# Patient Record
Sex: Female | Born: 1935
Health system: Southern US, Community
[De-identification: ages and names within clinical notes are randomized; demographics above are authoritative.]

## PROBLEM LIST (undated history)

## (undated) DIAGNOSIS — E785 Hyperlipidemia, unspecified: Secondary | ICD-10-CM

## (undated) DIAGNOSIS — C801 Malignant (primary) neoplasm, unspecified: Secondary | ICD-10-CM

## (undated) DIAGNOSIS — R0989 Other specified symptoms and signs involving the circulatory and respiratory systems: Secondary | ICD-10-CM

## (undated) DIAGNOSIS — M545 Low back pain: Secondary | ICD-10-CM

## (undated) DIAGNOSIS — M25559 Pain in unspecified hip: Secondary | ICD-10-CM

## (undated) DIAGNOSIS — I10 Essential (primary) hypertension: Secondary | ICD-10-CM

## (undated) DIAGNOSIS — R600 Localized edema: Secondary | ICD-10-CM

## (undated) DIAGNOSIS — R011 Cardiac murmur, unspecified: Secondary | ICD-10-CM

## (undated) DIAGNOSIS — M810 Age-related osteoporosis without current pathological fracture: Secondary | ICD-10-CM

## (undated) DIAGNOSIS — I38 Endocarditis, valve unspecified: Secondary | ICD-10-CM

## (undated) DIAGNOSIS — Z9289 Personal history of other medical treatment: Secondary | ICD-10-CM

## (undated) DIAGNOSIS — M1990 Unspecified osteoarthritis, unspecified site: Secondary | ICD-10-CM

## (undated) HISTORY — DX: Other specified symptoms and signs involving the circulatory and respiratory systems: R09.89

## (undated) HISTORY — DX: Unspecified osteoarthritis, unspecified site: M19.90

## (undated) HISTORY — DX: Pain in unspecified hip: M25.559

## (undated) HISTORY — DX: Endocarditis, valve unspecified: I38

## (undated) HISTORY — DX: Essential (primary) hypertension: I10

## (undated) HISTORY — DX: Hyperlipidemia, unspecified: E78.5

## (undated) HISTORY — DX: Age-related osteoporosis without current pathological fracture: M81.0

## (undated) HISTORY — DX: Localized edema: R60.0

## (undated) HISTORY — DX: Low back pain: M54.5

## (undated) HISTORY — DX: Cardiac murmur, unspecified: R01.1

## (undated) HISTORY — DX: Personal history of other medical treatment: Z92.89

---

## 1964-05-13 HISTORY — PX: BREAST EXCISIONAL BIOPSY: SUR124

## 1997-05-13 HISTORY — PX: BREAST EXCISIONAL BIOPSY: SUR124

## 1997-10-24 ENCOUNTER — Other Ambulatory Visit: Admission: RE | Admit: 1997-10-24 | Discharge: 1997-10-24 | Payer: Self-pay | Admitting: Gynecology

## 1998-12-01 ENCOUNTER — Other Ambulatory Visit: Admission: RE | Admit: 1998-12-01 | Discharge: 1998-12-01 | Payer: Self-pay | Admitting: Gynecology

## 1999-02-26 ENCOUNTER — Encounter: Admission: RE | Admit: 1999-02-26 | Discharge: 1999-02-26 | Payer: Self-pay | Admitting: *Deleted

## 1999-09-20 ENCOUNTER — Emergency Department (HOSPITAL_COMMUNITY): Admission: EM | Admit: 1999-09-20 | Discharge: 1999-09-20 | Payer: Self-pay | Admitting: Emergency Medicine

## 1999-09-20 ENCOUNTER — Encounter: Payer: Self-pay | Admitting: Emergency Medicine

## 1999-12-11 ENCOUNTER — Other Ambulatory Visit: Admission: RE | Admit: 1999-12-11 | Discharge: 1999-12-11 | Payer: Self-pay | Admitting: *Deleted

## 1999-12-14 ENCOUNTER — Encounter: Payer: Self-pay | Admitting: Gynecology

## 1999-12-14 ENCOUNTER — Encounter: Admission: RE | Admit: 1999-12-14 | Discharge: 1999-12-14 | Payer: Self-pay | Admitting: Gynecology

## 2000-01-09 ENCOUNTER — Encounter: Admission: RE | Admit: 2000-01-09 | Discharge: 2000-01-09 | Payer: Self-pay | Admitting: *Deleted

## 2000-01-09 ENCOUNTER — Encounter: Payer: Self-pay | Admitting: *Deleted

## 2000-02-20 DIAGNOSIS — Z9289 Personal history of other medical treatment: Secondary | ICD-10-CM

## 2000-02-20 HISTORY — DX: Personal history of other medical treatment: Z92.89

## 2000-12-24 ENCOUNTER — Encounter: Payer: Self-pay | Admitting: *Deleted

## 2000-12-24 ENCOUNTER — Encounter: Admission: RE | Admit: 2000-12-24 | Discharge: 2000-12-24 | Payer: Self-pay | Admitting: *Deleted

## 2001-02-03 ENCOUNTER — Encounter: Payer: Self-pay | Admitting: *Deleted

## 2001-02-03 ENCOUNTER — Encounter: Admission: RE | Admit: 2001-02-03 | Discharge: 2001-02-03 | Payer: Self-pay | Admitting: *Deleted

## 2001-03-10 ENCOUNTER — Other Ambulatory Visit: Admission: RE | Admit: 2001-03-10 | Discharge: 2001-03-10 | Payer: Self-pay | Admitting: *Deleted

## 2001-05-13 HISTORY — PX: CATARACT EXTRACTION: SUR2

## 2001-12-25 ENCOUNTER — Encounter: Admission: RE | Admit: 2001-12-25 | Discharge: 2001-12-25 | Payer: Self-pay | Admitting: Endocrinology

## 2001-12-25 ENCOUNTER — Encounter: Payer: Self-pay | Admitting: Endocrinology

## 2002-12-20 ENCOUNTER — Encounter: Admission: RE | Admit: 2002-12-20 | Discharge: 2002-12-20 | Payer: Self-pay | Admitting: Endocrinology

## 2002-12-20 ENCOUNTER — Encounter: Payer: Self-pay | Admitting: Endocrinology

## 2003-12-07 HISTORY — PX: OTHER SURGICAL HISTORY: SHX169

## 2003-12-29 ENCOUNTER — Encounter: Admission: RE | Admit: 2003-12-29 | Discharge: 2003-12-29 | Payer: Self-pay | Admitting: Endocrinology

## 2004-05-28 ENCOUNTER — Other Ambulatory Visit: Admission: RE | Admit: 2004-05-28 | Discharge: 2004-05-28 | Payer: Self-pay | Admitting: Family Medicine

## 2004-10-22 HISTORY — PX: COLONOSCOPY: SHX174

## 2004-12-31 ENCOUNTER — Encounter: Admission: RE | Admit: 2004-12-31 | Discharge: 2004-12-31 | Payer: Self-pay | Admitting: Family Medicine

## 2005-01-09 ENCOUNTER — Encounter: Admission: RE | Admit: 2005-01-09 | Discharge: 2005-01-09 | Payer: Self-pay | Admitting: Family Medicine

## 2005-04-26 ENCOUNTER — Encounter: Admission: RE | Admit: 2005-04-26 | Discharge: 2005-04-26 | Payer: Self-pay | Admitting: Family Medicine

## 2006-01-01 ENCOUNTER — Encounter: Admission: RE | Admit: 2006-01-01 | Discharge: 2006-01-01 | Payer: Self-pay | Admitting: Family Medicine

## 2007-01-06 ENCOUNTER — Encounter: Admission: RE | Admit: 2007-01-06 | Discharge: 2007-01-06 | Payer: Self-pay | Admitting: Family Medicine

## 2007-04-29 ENCOUNTER — Encounter: Admission: RE | Admit: 2007-04-29 | Discharge: 2007-04-29 | Payer: Self-pay | Admitting: Family Medicine

## 2008-01-07 ENCOUNTER — Encounter: Admission: RE | Admit: 2008-01-07 | Discharge: 2008-01-07 | Payer: Self-pay | Admitting: Family Medicine

## 2009-01-10 ENCOUNTER — Encounter: Admission: RE | Admit: 2009-01-10 | Discharge: 2009-01-10 | Payer: Self-pay | Admitting: Family Medicine

## 2009-05-11 ENCOUNTER — Encounter: Admission: RE | Admit: 2009-05-11 | Discharge: 2009-05-11 | Payer: Self-pay | Admitting: Family Medicine

## 2010-01-11 ENCOUNTER — Encounter: Admission: RE | Admit: 2010-01-11 | Discharge: 2010-01-11 | Payer: Self-pay | Admitting: Family Medicine

## 2010-06-03 ENCOUNTER — Encounter: Payer: Self-pay | Admitting: Family Medicine

## 2010-12-05 ENCOUNTER — Other Ambulatory Visit: Payer: Self-pay | Admitting: Family Medicine

## 2010-12-05 DIAGNOSIS — Z1231 Encounter for screening mammogram for malignant neoplasm of breast: Secondary | ICD-10-CM

## 2011-01-29 ENCOUNTER — Ambulatory Visit: Payer: Self-pay

## 2011-02-05 ENCOUNTER — Ambulatory Visit
Admission: RE | Admit: 2011-02-05 | Discharge: 2011-02-05 | Disposition: A | Discharge status: Home / Self Care | Payer: Medicare Other | Source: Ambulatory Visit | Attending: Family Medicine | Admitting: Family Medicine

## 2011-02-05 DIAGNOSIS — Z1231 Encounter for screening mammogram for malignant neoplasm of breast: Secondary | ICD-10-CM

## 2011-02-08 ENCOUNTER — Other Ambulatory Visit: Payer: Self-pay | Admitting: Family Medicine

## 2011-02-08 DIAGNOSIS — R928 Other abnormal and inconclusive findings on diagnostic imaging of breast: Secondary | ICD-10-CM

## 2011-02-19 ENCOUNTER — Ambulatory Visit
Admission: RE | Admit: 2011-02-19 | Discharge: 2011-02-19 | Disposition: A | Discharge status: Home / Self Care | Payer: Medicare Other | Source: Ambulatory Visit | Attending: Family Medicine | Admitting: Family Medicine

## 2011-02-19 DIAGNOSIS — R928 Other abnormal and inconclusive findings on diagnostic imaging of breast: Secondary | ICD-10-CM

## 2011-04-01 ENCOUNTER — Other Ambulatory Visit: Payer: Self-pay | Admitting: Family Medicine

## 2011-05-16 ENCOUNTER — Ambulatory Visit
Admission: RE | Admit: 2011-05-16 | Discharge: 2011-05-16 | Disposition: A | Discharge status: Home / Self Care | Payer: Medicare Other | Source: Ambulatory Visit | Attending: Family Medicine | Admitting: Family Medicine

## 2012-01-24 ENCOUNTER — Other Ambulatory Visit: Payer: Self-pay | Admitting: Family Medicine

## 2012-01-24 DIAGNOSIS — Z1231 Encounter for screening mammogram for malignant neoplasm of breast: Secondary | ICD-10-CM

## 2012-02-21 ENCOUNTER — Ambulatory Visit
Admission: RE | Admit: 2012-02-21 | Discharge: 2012-02-21 | Disposition: A | Discharge status: Home / Self Care | Payer: Medicare Other | Source: Ambulatory Visit | Attending: Family Medicine | Admitting: Family Medicine

## 2012-02-21 DIAGNOSIS — Z1231 Encounter for screening mammogram for malignant neoplasm of breast: Secondary | ICD-10-CM

## 2012-12-24 ENCOUNTER — Encounter: Payer: Self-pay | Admitting: Internal Medicine

## 2012-12-28 ENCOUNTER — Encounter: Payer: Self-pay | Admitting: Internal Medicine

## 2012-12-29 ENCOUNTER — Ambulatory Visit (INDEPENDENT_AMBULATORY_CARE_PROVIDER_SITE_OTHER): Payer: Medicare Other | Admitting: Internal Medicine

## 2012-12-29 ENCOUNTER — Encounter: Payer: Self-pay | Admitting: Internal Medicine

## 2012-12-29 VITALS — BP 144/86 | HR 69 | Ht 64.0 in | Wt 135.6 lb

## 2012-12-29 DIAGNOSIS — R011 Cardiac murmur, unspecified: Secondary | ICD-10-CM

## 2012-12-29 DIAGNOSIS — I10 Essential (primary) hypertension: Secondary | ICD-10-CM

## 2012-12-29 DIAGNOSIS — E785 Hyperlipidemia, unspecified: Secondary | ICD-10-CM

## 2012-12-29 NOTE — Patient Instructions (Addendum)
Your physician wants you to follow-up in: 1 year. You will receive a reminder letter in the mail two months in advance. If you don't receive a letter, please call our office to schedule the follow-up appointment.  Your physician recommends that you return for lab work in: 1-2 weeks. You will need to be fasting for this blood work.  NMR with lipid

## 2012-12-29 NOTE — Progress Notes (Signed)
OFFICE NOTE  Chief Complaint:  Annual follow-up  Primary Care Physician: Gweneth Dimitri, MD  HPI:  Shelley Flores  Is a 79 year old female who comes in for followup. She has hypertension and hyperlipidemia and had had an echocardiogram in 2010 that showed mild concentric LVH, mild AI, mild MR/TR with pulmonary artery pressure of 32. Her EF was normal. She has no specific cardiac complaints. She did bring a list of her blood pressures today which are on the high normal side but it basically fairly well controlled. She is requesting refills of her medications. She's not had a lipid profile in one year.  PMHx:  Past Medical History  Diagnosis Date  . Hypertension   . Hyperlipidemia   . Osteoporosis   . Edema extremities     LE edema   . Valvular heart disease     Mild valvular heart disease by Echo in 2010, all mild and symptomatic, including concentric LVH, MR, TR, and AI with pulmonary artery pressure of 32. EF was normal.  . Hip pain   . Lower back pain   . Osteoarthritis   . Heart murmur     history of murmur  . H/O myocardial perfusion scan 02/20/00    To rule out ischemia - Negative adequate Bruce protocol exercise stress test with a deconditioned exercise response and normal static myocardial perfusion images with EF calculated by QGS of 77%. Represents a low risk study.  . Bruit     Abdominal bruit - Abdominal aorta/renal duplex Doppler evaluation 12/07/03 -Mildly abnormal evaluation. 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 This is consistant w/median arcuate ligament compression syndrome. *Right & Left Kidney: Essentially equal in size, symmetrical in shape w/no significant abnormalities visualized. *Right & Left Renal Arteries: No significant abnormalities.    Past Surgical History  Procedure Laterality Date  . Cataract extraction  2003  . Abdominal aorta/renal duplex doppler evaluation  12/07/03    For abdominal bruit. Mildly abnormal evaluation. (See  bruit in medical history)    FAMHx:  Family History  Problem Relation Age of Onset  . Leukemia Mother 90  . Cancer Father     esophagial cancer  . Heart failure Maternal Grandmother   . Tuberculosis Maternal Grandfather   . Heart disease Paternal Grandmother   . Cancer Paternal Grandfather     SOCHx:   reports that she quit smoking about 50 years ago. Her smoking use included Cigarettes. She has a 1.5 pack-year smoking history. She does not have any smokeless tobacco history on file. She reports that she drinks about 1.0 ounces of alcohol per week. She reports that she does not use illicit drugs.  ALLERGIES:  No Known Allergies  ROS: A comprehensive review of systems was negative.  HOME MEDS: Current Outpatient Prescriptions  Medication Sig Dispense Refill  . alendronate (FOSAMAX) 70 MG tablet Take 1 tablet by mouth once a week.      [name] amLODipine (NORVASC) 5 MG tablet Take 5 mg by mouth daily.      [name] aspirin 81 MG tablet Take 81 mg by mouth daily.      [name] atorvastatin (LIPITOR) 20 MG tablet Take 20 mg by mouth daily.      . Calcium-Vitamin D-Vitamin K (CALCIUM SOFT CHEWS PO) Take 500 mg by mouth daily.       . cholecalciferol (VITAMIN D) 1000 UNITS tablet Take 1,000 Units by mouth daily.      . Glucosamine-Chondroit-Vit C-Mn (GLUCOSAMINE 1500 COMPLEX) CAPS Take 1 capsule  by mouth daily.      . metoprolol succinate (TOPROL-XL) 50 MG 24 hr tablet Take 50 mg by mouth daily. Take with or immediately following a meal.      . Multiple Vitamin (MULTIVITAMIN) tablet Take 1 tablet by mouth daily.       No current facility-administered medications for this visit.    LABS/IMAGING: No results found for this or any previous visit (from the past 48 hour(s)). No results found.  VITALS: BP 144/86  Pulse 69  Ht 5\' 4"  (1.626 m)  Wt 135 lb 9.6 oz (61.508 kg)  BMI 23.26 kg/m2  EXAM: General appearance: alert and no distress Neck: no adenopathy, no carotid bruit, no JVD, supple,  symmetrical, trachea midline and thyroid not enlarged, symmetric, no tenderness/mass/nodules Lungs: clear to auscultation bilaterally Heart: regular rate and rhythm, S1, S2 normal, 2/6 diastolic murmur and 2/6 SEM at apex, click, rub or gallop Abdomen: soft, non-tender; bowel sounds normal; no masses,  no organomegaly Extremities: extremities normal, atraumatic, no cyanosis or edema Pulses: 2+ and symmetric Skin: Skin color, texture, turgor normal. No rashes or lesions Neurologic: Grossly normal  EKG: Normal sinus rhythm at 69  ASSESSMENT: 1. Hypertension-fairly well-controlled 2. Dyslipidemia  PLAN: 1.   Mrs. Baum is doing fairly well. She is asymptomatic and is in need of refills of her medications. We'll go ahead and recheck a lipid profile and continue on her current blood pressure medications. Although blood pressures are on the high end of normal given her age, is reasonable to allow slightly higher blood pressures as per recent guidelines this past year allowing blood pressures of 150 systolic. Plan to see her back annually.  Chrystie Nose, MD, [facility] Attending Cardiologist [facility]  Zyshawn Bohnenkamp C 12/29/2012, 12:07 PM

## 2012-12-31 LAB — NMR LIPOPROFILE WITH LIPIDS
Cholesterol, Total: 151 mg/dL (ref <200)
HDL Particle Number: 36.8 umol/L (ref >=30.5)
HDL Size: 9.3 nm (ref >=9.2)
HDL-C: 44 mg/dL (ref >=40)
LDL (calc): 92 mg/dL (ref <100)
LDL Particle Number: 1119 nmol/L — ABNORMAL HIGH (ref <1000)
LDL Size: 20.6 nm (ref >20.5)
LP-IR Score: 30 (ref <=45)
Large HDL-P: 6.7 umol/L (ref >=4.8)
Large VLDL-P: <0.8 nmol/L (ref <=2.7)
Small LDL Particle Number: 661 nmol/L — ABNORMAL HIGH (ref <=527)
Triglycerides: 75 mg/dL (ref <150)
VLDL Size: 46 nm (ref <=46.6)

## 2013-01-04 ENCOUNTER — Encounter: Payer: Self-pay | Admitting: Internal Medicine

## 2013-01-05 ENCOUNTER — Other Ambulatory Visit: Payer: Self-pay | Admitting: *Deleted

## 2013-01-05 MED ORDER — ATORVASTATIN CALCIUM 20 MG PO TABS: 20.0000 mg | ORAL_TABLET | Freq: Every day | ORAL | Status: DC

## 2013-01-26 ENCOUNTER — Other Ambulatory Visit: Payer: Self-pay | Admitting: *Deleted

## 2013-01-26 MED ORDER — AMLODIPINE BESYLATE 5 MG PO TABS: 5.0000 mg | ORAL_TABLET | Freq: Every day | ORAL | Status: DC

## 2013-01-26 MED ORDER — METOPROLOL SUCCINATE ER 50 MG PO TB24: 50.0000 mg | ORAL_TABLET | Freq: Every day | ORAL | Status: DC

## 2013-01-26 NOTE — Telephone Encounter (Signed)
Rx was sent to pharmacy electronically. 

## 2013-02-01 ENCOUNTER — Other Ambulatory Visit: Payer: Self-pay

## 2013-02-01 DIAGNOSIS — Z1231 Encounter for screening mammogram for malignant neoplasm of breast: Secondary | ICD-10-CM

## 2013-03-08 ENCOUNTER — Ambulatory Visit
Admission: RE | Admit: 2013-03-08 | Discharge: 2013-03-08 | Disposition: A | Discharge status: Home / Self Care | Payer: 59 | Source: Ambulatory Visit

## 2013-03-08 DIAGNOSIS — Z1231 Encounter for screening mammogram for malignant neoplasm of breast: Secondary | ICD-10-CM

## 2013-03-18 ENCOUNTER — Other Ambulatory Visit: Payer: Self-pay

## 2013-06-10 ENCOUNTER — Other Ambulatory Visit: Payer: Self-pay | Admitting: Family Medicine

## 2013-06-10 DIAGNOSIS — M810 Age-related osteoporosis without current pathological fracture: Secondary | ICD-10-CM

## 2013-06-23 ENCOUNTER — Ambulatory Visit
Admission: RE | Admit: 2013-06-23 | Discharge: 2013-06-23 | Disposition: A | Discharge status: Home / Self Care | Payer: 59 | Source: Ambulatory Visit | Attending: Family Medicine | Admitting: Family Medicine

## 2013-06-23 DIAGNOSIS — M810 Age-related osteoporosis without current pathological fracture: Secondary | ICD-10-CM

## 2013-06-23 HISTORY — PX: OTHER SURGICAL HISTORY: SHX169

## 2014-01-03 ENCOUNTER — Other Ambulatory Visit: Payer: Self-pay | Admitting: Internal Medicine

## 2014-01-03 NOTE — Telephone Encounter (Signed)
Rx refill sent to patient pharmacy   

## 2014-01-10 ENCOUNTER — Encounter: Payer: Self-pay | Admitting: Internal Medicine

## 2014-01-10 ENCOUNTER — Ambulatory Visit (INDEPENDENT_AMBULATORY_CARE_PROVIDER_SITE_OTHER): Payer: 59 | Admitting: Internal Medicine

## 2014-01-10 VITALS — BP 130/72 | HR 77 | Ht 65.0 in | Wt 135.5 lb

## 2014-01-10 DIAGNOSIS — E785 Hyperlipidemia, unspecified: Secondary | ICD-10-CM

## 2014-01-10 DIAGNOSIS — R011 Cardiac murmur, unspecified: Secondary | ICD-10-CM

## 2014-01-10 DIAGNOSIS — I10 Essential (primary) hypertension: Secondary | ICD-10-CM

## 2014-01-10 MED ORDER — ATORVASTATIN CALCIUM 20 MG PO TABS: ORAL_TABLET | ORAL | Status: DC

## 2014-01-10 MED ORDER — AMLODIPINE BESYLATE 5 MG PO TABS: 5.0000 mg | ORAL_TABLET | Freq: Every day | ORAL | Status: DC

## 2014-01-10 MED ORDER — METOPROLOL SUCCINATE ER 50 MG PO TB24: 50.0000 mg | ORAL_TABLET | Freq: Every day | ORAL | Status: DC

## 2014-01-10 NOTE — Progress Notes (Signed)
OFFICE NOTE  Chief Complaint:  Annual follow-up  Primary Care Physician: Cari Caraway, MD  HPI:  Shelley Flores  Is a 80 year old female who comes in for followup. She has hypertension and hyperlipidemia and had had an echocardiogram in 2010 that showed mild concentric LVH, mild AI, mild MR/TR with pulmonary artery pressure of 32. Her EF was normal. She has no specific cardiac complaints. Blood pressure and cholesterol have been well-controlled. She is due for recheck of her lipid profile. Unfortunately she does not get much exercise but she is looking into it and possibly considering the Silver Sneakers program.  PMHx:  Past Medical History  Diagnosis Date  . Hypertension   . Hyperlipidemia   . Osteoporosis   . Edema extremities     LE edema   . Valvular heart disease     Mild valvular heart disease by Echo in 2010, all mild and symptomatic, including concentric LVH, MR, TR, and AI with pulmonary artery pressure of 32. EF was normal.  . Hip pain   . Lower back pain   . Osteoarthritis   . Heart murmur     history of murmur  . H/O myocardial perfusion scan 02/20/00    To rule out ischemia - Negative adequate Bruce protocol exercise stress test with a deconditioned exercise response and normal static myocardial perfusion images with EF calculated by QGS of 77%. Represents a low risk study.  . Bruit     Abdominal bruit - Abdominal aorta/renal duplex Doppler evaluation 12/07/03 -Mildly abnormal evaluation. 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 This is consistant w/median arcuate ligament compression syndrome. *Right & Left Kidney: Essentially equal in size, symmetrical in shape w/no significant abnormalities visualized. *Right & Left Renal Arteries: No significant abnormalities.    Past Surgical History  Procedure Laterality Date  . Cataract extraction  2003  . Abdominal aorta/renal duplex doppler evaluation  12/07/03    For abdominal bruit. Mildly  abnormal evaluation. (See bruit in medical history)    FAMHx:  Family History  Problem Relation Age of Onset  . Leukemia Mother 62  . Cancer Father     esophagial cancer  . Heart failure Maternal Grandmother   . Tuberculosis Maternal Grandfather   . Heart disease Paternal Grandmother   . Cancer Paternal Grandfather     SOCHx:   reports that she quit smoking about 51 years ago. Her smoking use included Cigarettes. She has a 1.5 pack-year smoking history. She does not have any smokeless tobacco history on file. She reports that she drinks about one ounce of alcohol per week. She reports that she does not use illicit drugs.  ALLERGIES:  No Known Allergies  ROS: A comprehensive review of systems was negative.  HOME MEDS: Current Outpatient Prescriptions  Medication Sig Dispense Refill  . amLODipine (NORVASC) 5 MG tablet Take 1 tablet (5 mg total) by mouth daily.  90 tablet  3  . aspirin 81 MG tablet Take 81 mg by mouth daily.      [name] atorvastatin (LIPITOR) 20 MG tablet take 1 tablet by mouth daily  90 tablet  3  . Calcium-Vitamin D-Vitamin K (CALCIUM SOFT CHEWS PO) Take 500 mg by mouth daily.       . cholecalciferol (VITAMIN D) 1000 UNITS tablet Take 1,000 Units by mouth daily.      . Glucosamine HCl 1500 MG TABS Take by mouth daily.      . Magnesium 250 MG TABS Take by mouth daily.      [name]  metoprolol succinate (TOPROL-XL) 50 MG 24 hr tablet Take 1 tablet (50 mg total) by mouth daily. Take with or immediately following a meal.  90 tablet  3  . Multiple Vitamin (MULTIVITAMIN) tablet Take 1 tablet by mouth daily.       No current facility-administered medications for this visit.    LABS/IMAGING: No results found for this or any previous visit (from the past 48 hour(s)). No results found.  VITALS: BP 130/72  Pulse 77  Ht 5\' 5"  (1.651 m)  Wt 135 lb 8 oz (61.462 kg)  BMI 22.55 kg/m2  EXAM: General appearance: alert and no distress Neck: no adenopathy, no carotid bruit, no  JVD, supple, symmetrical, trachea midline and thyroid not enlarged, symmetric, no tenderness/mass/nodules Lungs: clear to auscultation bilaterally Heart: regular rate and rhythm, S1, S2 normal, 2/6 diastolic murmur and 2/6 SEM at apex, click, rub or gallop Abdomen: soft, non-tender; bowel sounds normal; no masses,  no organomegaly Extremities: extremities normal, atraumatic, no cyanosis or edema Pulses: 2+ and symmetric Skin: Skin color, texture, turgor normal. No rashes or lesions Neurologic: Grossly normal  EKG: Normal sinus rhythm at 77, occasional PAC's  ASSESSMENT: 1. Hypertension-fairly well-controlled 2. Dyslipidemia 3. Asymptomatic PVC's  PLAN: 1.   Shelley Flores is doing fairly well. She is asymptomatic and is in need of refills of her medications. We'll go ahead and recheck a lipid profile and continue on her current blood pressure medications. Blood pressure looks well controlled today. She should have a followup fairly soon with her primary care provider as well.  Plan followup annually or sooner as necessary.  Pixie Casino, MD, [facility] Attending Cardiologist [facility] 01/10/2014, 10:41 AM

## 2014-01-10 NOTE — Patient Instructions (Signed)
Your physician recommends that you return for lab work at your convenience. You will need to be fasting.   Your physician wants you to follow-up in: 1 year. You will receive a reminder letter in the mail two months in advance. If you don't receive a letter, please call our office to schedule the follow-up appointment.

## 2014-01-14 LAB — NMR LIPOPROFILE WITH LIPIDS
Cholesterol, Total: 160 mg/dL (ref 100–199)
HDL Particle Number: 37.1 umol/L (ref >=30.5)
HDL Size: 9.1 nm — ABNORMAL LOW (ref >=9.2)
HDL-C: 53 mg/dL (ref >39)
LDL (calc): 85 mg/dL (ref 0–99)
LDL Particle Number: 1071 nmol/L — ABNORMAL HIGH (ref <1000)
LDL Size: 20.8 nm (ref >=20.8)
LP-IR Score: 26 (ref <=45)
Large HDL-P: 6.9 umol/L (ref >=4.8)
Large VLDL-P: 1 nmol/L (ref <=2.7)
Small LDL Particle Number: 494 nmol/L (ref <=527)
Triglycerides: 112 mg/dL (ref 0–149)
VLDL Size: 41.1 nm (ref <=46.6)

## 2014-02-25 ENCOUNTER — Other Ambulatory Visit: Payer: Self-pay

## 2014-02-25 DIAGNOSIS — Z1239 Encounter for other screening for malignant neoplasm of breast: Secondary | ICD-10-CM

## 2014-03-23 ENCOUNTER — Other Ambulatory Visit: Payer: Self-pay

## 2014-03-23 ENCOUNTER — Ambulatory Visit
Admission: RE | Admit: 2014-03-23 | Discharge: 2014-03-23 | Disposition: A | Discharge status: Home / Self Care | Payer: 59 | Source: Ambulatory Visit

## 2014-03-23 ENCOUNTER — Ambulatory Visit: Payer: 59

## 2014-03-23 DIAGNOSIS — Z1231 Encounter for screening mammogram for malignant neoplasm of breast: Secondary | ICD-10-CM

## 2014-11-07 ENCOUNTER — Other Ambulatory Visit: Payer: Self-pay

## 2015-01-18 ENCOUNTER — Other Ambulatory Visit: Payer: Self-pay | Admitting: Internal Medicine

## 2015-01-18 NOTE — Telephone Encounter (Signed)
Rx request sent to pharmacy.  

## 2015-01-23 ENCOUNTER — Encounter: Payer: Self-pay | Admitting: Internal Medicine

## 2015-01-23 ENCOUNTER — Telehealth: Payer: Self-pay | Admitting: Internal Medicine

## 2015-01-23 ENCOUNTER — Ambulatory Visit: Payer: Self-pay | Admitting: Internal Medicine

## 2015-01-24 NOTE — Telephone Encounter (Signed)
Close encounter 

## 2015-02-14 ENCOUNTER — Ambulatory Visit: Payer: Self-pay | Admitting: Internal Medicine

## 2015-02-16 ENCOUNTER — Ambulatory Visit (INDEPENDENT_AMBULATORY_CARE_PROVIDER_SITE_OTHER): Payer: Medicare Other | Admitting: Internal Medicine

## 2015-02-16 ENCOUNTER — Encounter: Payer: Self-pay | Admitting: Internal Medicine

## 2015-02-16 ENCOUNTER — Other Ambulatory Visit: Payer: Self-pay

## 2015-02-16 VITALS — BP 144/82 | HR 67 | Ht 65.0 in | Wt 136.8 lb

## 2015-02-16 DIAGNOSIS — E785 Hyperlipidemia, unspecified: Secondary | ICD-10-CM

## 2015-02-16 DIAGNOSIS — I10 Essential (primary) hypertension: Secondary | ICD-10-CM | POA: Diagnosis not present

## 2015-02-16 DIAGNOSIS — Z1231 Encounter for screening mammogram for malignant neoplasm of breast: Secondary | ICD-10-CM

## 2015-02-16 DIAGNOSIS — R011 Cardiac murmur, unspecified: Secondary | ICD-10-CM | POA: Diagnosis not present

## 2015-02-16 NOTE — Patient Instructions (Signed)
Your physician wants you to follow-up in: 1 year with Dr. Debara Pickett. You will receive a reminder letter in the mail two months in advance. If you don't receive a letter, please call our office to schedule the follow-up appointment.  Your physician recommends that you return for lab work FASTING

## 2015-02-16 NOTE — Progress Notes (Signed)
OFFICE NOTE  Chief Complaint:  Annual follow-up  Primary Care Physician: Shelley Caraway, MD  HPI:  Shelley Flores  Is a 81 year old female who comes in for followup. She has hypertension and hyperlipidemia and had had an echocardiogram in 2010 that showed mild concentric LVH, mild AI, mild MR/TR with pulmonary artery pressure of 32. Her EF was normal. She has no specific cardiac complaints. Blood pressure and cholesterol have been well-controlled. She is due for recheck of her lipid profile. Unfortunately she does not get much exercise but she is looking into it and possibly considering the Silver Sneakers program.  I saw [name] back in the office today. Overall she is doing well except for some leg swelling at the end of the day. Denies any chest pain or worsening shortness of breath. EKG looks unchanged.  PMHx:  Past Medical History  Diagnosis Date  . Hypertension   . Hyperlipidemia   . Osteoporosis   . Edema extremities     LE edema   . Valvular heart disease     Mild valvular heart disease by Echo in 2010, all mild and symptomatic, including concentric LVH, MR, TR, and AI with pulmonary artery pressure of 32. EF was normal.  . Hip pain   . Lower back pain   . Osteoarthritis   . Heart murmur     history of murmur  . H/O myocardial perfusion scan 02/20/00    To rule out ischemia - Negative adequate Bruce protocol exercise stress test with a deconditioned exercise response and normal static myocardial perfusion images with EF calculated by QGS of 77%. Represents a low risk study.  . Bruit     Abdominal bruit - Abdominal aorta/renal duplex Doppler evaluation 12/07/03 -Mildly abnormal evaluation. 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 This is consistant w/median arcuate ligament compression syndrome. *Right & Left Kidney: Essentially equal in size, symmetrical in shape w/no significant abnormalities visualized. *Right & Left Renal Arteries: No  significant abnormalities.    Past Surgical History  Procedure Laterality Date  . Cataract extraction  2003  . Abdominal aorta/renal duplex doppler evaluation  12/07/03    For abdominal bruit. Mildly abnormal evaluation. (See bruit in medical history)  . Colonoscopy  10/22/2004  . Dexa bone scan  06/23/2013    FAMHx:  Family History  Problem Relation Age of Onset  . Leukemia Mother 33  . Cancer Father     esophagial cancer  . Heart failure Maternal Grandmother   . Tuberculosis Maternal Grandfather   . Heart disease Paternal Grandmother   . Cancer Paternal Grandfather     SOCHx:   reports that she quit smoking about 52 years ago. Her smoking use included Cigarettes. She has a 1.5 pack-year smoking history. She does not have any smokeless tobacco history on file. She reports that she drinks about 1.0 oz of alcohol per week. She reports that she does not use illicit drugs.  ALLERGIES:  No Known Allergies  ROS: A comprehensive review of systems was negative.  HOME MEDS: Current Outpatient Prescriptions  Medication Sig Dispense Refill  . amLODipine (NORVASC) 5 MG tablet take 1 tablet by mouth once daily 90 tablet 0  . aspirin 81 MG tablet Take 81 mg by mouth daily.    [name] atorvastatin (LIPITOR) 20 MG tablet take 1 tablet by mouth daily 90 tablet 3  . Calcium-Vitamin D-Vitamin K (CALCIUM SOFT CHEWS PO) Take 500 mg by mouth daily.     . cholecalciferol (VITAMIN D)  1000 UNITS tablet Take 1,000 Units by mouth daily.    . Glucosamine HCl 1500 MG TABS Take by mouth daily.    . Magnesium 250 MG TABS Take by mouth daily.    . metoprolol succinate (TOPROL-XL) 50 MG 24 hr tablet take 1 tablet by mouth once daily TAKE WITH FOOD OR IMMEDIATELY FOLLOWING A MEAL. 90 tablet 0  . Multiple Vitamin (MULTIVITAMIN) tablet Take 1 tablet by mouth daily.     No current facility-administered medications for this visit.    LABS/IMAGING: No results found for this or any previous visit (from the past 48  hour(s)). No results found.  VITALS: BP 144/82 mmHg  Pulse 67  Ht '5\' 5"'$  (1.651 m)  Wt 136 lb 12.8 oz (62.052 kg)  BMI 22.76 kg/m2  EXAM: General appearance: alert and no distress Neck: no adenopathy, no carotid bruit, no JVD, supple, symmetrical, trachea midline and thyroid not enlarged, symmetric, no tenderness/mass/nodules Lungs: clear to auscultation bilaterally Heart: regular rate and rhythm, S1, S2 normal, 2/6 diastolic murmur and 2/6 SEM at apex, click, rub or gallop Abdomen: soft, non-tender; bowel sounds normal; no masses,  no organomegaly Extremities: extremities normal, atraumatic, no cyanosis or edema Pulses: 2+ and symmetric Skin: Skin color, texture, turgor normal. No rashes or lesions Neurologic: Grossly normal  EKG: Normal sinus rhythm at 67, left axis deviation  ASSESSMENT: 1. Hypertension-fairly well-controlled 2. Dyslipidemia 3. Asymptomatic PVC's - no significant recurrence  PLAN: 1.   Mrs. Collignon is doing fairly well. She is asymptomatic and is in need of refills of her medications. We'll go ahead and recheck a lipid profile and continue on her current blood pressure medications. Blood pressure looks well controlled today. She should have a followup fairly soon with her primary care provider as well.  Plan followup annually or sooner as necessary.  Shelley Casino, MD, [facility] Attending Cardiologist Shelley Flores 02/16/2015, 5:20 PM

## 2015-02-23 LAB — LIPID PANEL
Cholesterol: 163 mg/dL (ref 125–200)
HDL: 56 mg/dL (ref >=46)
LDL Cholesterol: 87 mg/dL (ref <130)
Total CHOL/HDL Ratio: 2.9 Ratio (ref <=5.0)
Triglycerides: 101 mg/dL (ref <150)
VLDL: 20 mg/dL (ref <30)

## 2015-03-02 ENCOUNTER — Encounter: Payer: Self-pay | Admitting: Internal Medicine

## 2015-03-08 ENCOUNTER — Encounter: Payer: Self-pay | Admitting: *Deleted

## 2015-03-21 ENCOUNTER — Other Ambulatory Visit: Payer: Self-pay | Admitting: Internal Medicine

## 2015-03-27 ENCOUNTER — Ambulatory Visit
Admission: RE | Admit: 2015-03-27 | Discharge: 2015-03-27 | Disposition: A | Discharge status: Home / Self Care | Payer: Medicare Other | Source: Ambulatory Visit

## 2015-03-27 DIAGNOSIS — Z1231 Encounter for screening mammogram for malignant neoplasm of breast: Secondary | ICD-10-CM

## 2015-03-29 ENCOUNTER — Other Ambulatory Visit: Payer: Self-pay | Admitting: Family Medicine

## 2015-03-29 DIAGNOSIS — R928 Other abnormal and inconclusive findings on diagnostic imaging of breast: Secondary | ICD-10-CM

## 2015-04-05 ENCOUNTER — Ambulatory Visit
Admission: RE | Admit: 2015-04-05 | Discharge: 2015-04-05 | Disposition: A | Discharge status: Home / Self Care | Payer: Medicare Other | Source: Ambulatory Visit | Attending: Family Medicine | Admitting: Family Medicine

## 2015-04-05 DIAGNOSIS — R928 Other abnormal and inconclusive findings on diagnostic imaging of breast: Secondary | ICD-10-CM

## 2015-04-19 ENCOUNTER — Other Ambulatory Visit: Payer: Self-pay | Admitting: Internal Medicine

## 2015-04-19 NOTE — Telephone Encounter (Signed)
Rx request sent to pharmacy.  

## 2015-08-03 ENCOUNTER — Other Ambulatory Visit: Payer: Self-pay | Admitting: Family Medicine

## 2015-08-03 DIAGNOSIS — M810 Age-related osteoporosis without current pathological fracture: Secondary | ICD-10-CM

## 2015-08-22 ENCOUNTER — Ambulatory Visit
Admission: RE | Admit: 2015-08-22 | Discharge: 2015-08-22 | Disposition: A | Discharge status: Home / Self Care | Payer: Medicare Other | Source: Ambulatory Visit | Attending: Family Medicine | Admitting: Family Medicine

## 2015-08-22 DIAGNOSIS — M810 Age-related osteoporosis without current pathological fracture: Secondary | ICD-10-CM

## 2015-11-29 ENCOUNTER — Encounter: Payer: Self-pay | Admitting: Internal Medicine

## 2016-01-16 ENCOUNTER — Other Ambulatory Visit: Payer: Self-pay | Admitting: *Deleted

## 2016-01-16 MED ORDER — AMLODIPINE BESYLATE 5 MG PO TABS: 5.0000 mg | ORAL_TABLET | Freq: Every day | ORAL | 1 refills | Status: DC

## 2016-01-16 MED ORDER — METOPROLOL SUCCINATE ER 50 MG PO TB24
50.0000 mg | ORAL_TABLET | Freq: Every day | ORAL | 1 refills | Status: DC
Start: 2016-01-16 — End: 2016-07-11

## 2016-01-16 NOTE — Telephone Encounter (Signed)
Rx(s) sent to pharmacy electronically.  

## 2016-02-26 ENCOUNTER — Encounter: Payer: Self-pay | Admitting: Internal Medicine

## 2016-02-26 ENCOUNTER — Ambulatory Visit (INDEPENDENT_AMBULATORY_CARE_PROVIDER_SITE_OTHER): Payer: Medicare Other | Admitting: Internal Medicine

## 2016-02-26 VITALS — BP 158/78 | HR 73 | Ht 65.0 in | Wt 137.4 lb

## 2016-02-26 DIAGNOSIS — I10 Essential (primary) hypertension: Secondary | ICD-10-CM

## 2016-02-26 DIAGNOSIS — E785 Hyperlipidemia, unspecified: Secondary | ICD-10-CM

## 2016-02-26 DIAGNOSIS — I493 Ventricular premature depolarization: Secondary | ICD-10-CM | POA: Diagnosis not present

## 2016-02-26 NOTE — Patient Instructions (Signed)
Medication Instructions:  Your physician recommends that you continue on your current medications as directed. Please refer to the Current Medication list given to you today.  Labwork: Your physician recommends that you return for lab work in: FASTING Lipids   Testing/Procedures: None   Follow-Up: Your physician wants you to follow-up in: 12 Months with Dr Debara Pickett. You will receive a reminder letter in the mail two months in advance. If you don't receive a letter, please call our office to schedule the follow-up appointment.  Any Other Special Instructions Will Be Listed Below (If Applicable).     If you need a refill on your cardiac medications before your next appointment, please call your pharmacy.

## 2016-02-26 NOTE — Progress Notes (Signed)
OFFICE NOTE  Chief Complaint:  Annual follow-up  Primary Care Physician: Cari Caraway, MD  HPI:  Shelley Flores  Is a 82 year old female who comes in for followup. She has hypertension and hyperlipidemia and had had an echocardiogram in 2010 that showed mild concentric LVH, mild AI, mild MR/TR with pulmonary artery pressure of 32. Her EF was normal. She has no specific cardiac complaints. Blood pressure and cholesterol have been well-controlled. She is due for recheck of her lipid profile. Unfortunately she does not get much exercise but she is looking into it and possibly considering the Silver Sneakers program.  I saw Shelley Flores back in the office today. Overall she is doing well except for some leg swelling at the end of the day. Denies any chest pain or worsening shortness of breath. EKG looks unchanged.  02/26/2016  Shelley Flores returns today for follow-up. Over the past year she is done fairly well. She denies any chest pain or worsening shortness of breath. Initially blood pressure was elevated 158/78 however recheck came down to 130/70. She is on low-dose aspirin. She is due for recheck of her lipid profile. She has not had any worsening palpitations.   PMHx:  Past Medical History:  Diagnosis Date  . Bruit    Abdominal bruit - Abdominal aorta/renal duplex Doppler evaluation 12/07/03 -Mildly abnormal evaluation. 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 This is consistant w/median arcuate ligament compression syndrome. *Right & Left Kidney: Essentially equal in size, symmetrical in shape w/no significant abnormalities visualized. *Right & Left Renal Arteries: No significant abnormalities.  . Edema extremities    LE edema   . H/O myocardial perfusion scan 02/20/00   To rule out ischemia - Negative adequate Bruce protocol exercise stress test with a deconditioned exercise response and normal static myocardial perfusion images with EF calculated by QGS of  77%. Represents a low risk study.  [name] Heart murmur    history of murmur  . Hip pain   . Hyperlipidemia   . Hypertension   . Lower back pain   . Osteoarthritis   . Osteoporosis   . Valvular heart disease    Mild valvular heart disease by Echo in 2010, all mild and symptomatic, including concentric LVH, MR, TR, and AI with pulmonary artery pressure of 32. EF was normal.    Past Surgical History:  Procedure Laterality Date  . Abdominal aorta/Renal duplex Doppler Evaluation  12/07/03   For abdominal bruit. Mildly abnormal evaluation. (See bruit in medical history)  . CATARACT EXTRACTION  2003  . COLONOSCOPY  10/22/2004  . DEXA Bone Scan  06/23/2013    FAMHx:  Family History  Problem Relation Age of Onset  . Leukemia Mother 28  . Cancer Father     esophagial cancer  . Heart failure Maternal Grandmother   . Tuberculosis Maternal Grandfather   . Heart disease Paternal Grandmother   . Cancer Paternal Grandfather     SOCHx:   reports that she quit smoking about 53 years ago. Her smoking use included Cigarettes. She has a 1.50 pack-year smoking history. She does not have any smokeless tobacco history on file. She reports that she drinks about 1.0 oz of alcohol per week . She reports that she does not use drugs.  ALLERGIES:  No Known Allergies  ROS: A comprehensive review of systems was negative.  HOME MEDS: Current Outpatient Prescriptions  Medication Sig Dispense Refill  . amLODipine (NORVASC) 5 MG tablet Take 1 tablet (5 mg total) by  mouth daily. 90 tablet 1  . aspirin 81 MG tablet Take 81 mg by mouth daily.    [name] atorvastatin (LIPITOR) 20 MG tablet take 1 tablet by mouth once daily 90 tablet 3  . Calcium-Vitamin D-Vitamin K (CALCIUM SOFT CHEWS PO) Take 500 mg by mouth daily.     . cholecalciferol (VITAMIN D) 1000 UNITS tablet Take 1,000 Units by mouth daily.    . Glucosamine HCl 1500 MG TABS Take by mouth daily.    . Magnesium 250 MG TABS Take by mouth daily.    . metoprolol  succinate (TOPROL-XL) 50 MG 24 hr tablet Take 1 tablet (50 mg total) by mouth daily. Take with or immediately following a meal. 90 tablet 1  . Multiple Vitamin (MULTIVITAMIN) tablet Take 1 tablet by mouth daily.     No current facility-administered medications for this visit.     LABS/IMAGING: No results found for this or any previous visit (from the past 48 hour(s)). No results found.  VITALS: BP (!) 158/78   Pulse 73   Ht '5\' 5"'$  (1.651 m)   Wt 137 lb 6.4 oz (62.3 kg)   BMI 22.86 kg/m   EXAM: General appearance: alert and no distress Neck: no adenopathy, no carotid bruit, no JVD, supple, symmetrical, trachea midline and thyroid not enlarged, symmetric, no tenderness/mass/nodules Lungs: clear to auscultation bilaterally Heart: regular rate and rhythm, S1, S2 normal, 2/6 diastolic murmur and 2/6 SEM at apex, click, rub or gallop Abdomen: soft, non-tender; bowel sounds normal; no masses,  no organomegaly Extremities: extremities normal, atraumatic, no cyanosis or edema Pulses: 2+ and symmetric Skin: Skin color, texture, turgor normal. No rashes or lesions Neurologic: Grossly normal  EKG: Normal sinus rhythm at 73  ASSESSMENT: 1. Hypertension 2. Dyslipidemia 3. Asymptomatic PVC's - no significant recurrence  PLAN: 1.   Shelley Flores is doing fairly well. Blood pressure is well controlled and she is not had recurrent palpitations. She is due for repeat check of her cholesterol which we'll order today. Follow-up annually or sooner as necessary.  Shelley Casino, MD, [facility] Attending Cardiologist [place] 02/26/2016, 12:46 PM

## 2016-02-28 LAB — LIPID PANEL
Cholesterol: 165 mg/dL (ref 125–200)
HDL: 56 mg/dL (ref >=46)
LDL Cholesterol: 93 mg/dL (ref <130)
Total CHOL/HDL Ratio: 2.9 Ratio (ref <=5.0)
Triglycerides: 79 mg/dL (ref <150)
VLDL: 16 mg/dL (ref <30)

## 2016-03-15 ENCOUNTER — Other Ambulatory Visit: Payer: Self-pay | Admitting: Family Medicine

## 2016-03-15 DIAGNOSIS — Z1231 Encounter for screening mammogram for malignant neoplasm of breast: Secondary | ICD-10-CM

## 2016-03-19 ENCOUNTER — Other Ambulatory Visit: Payer: Self-pay | Admitting: Internal Medicine

## 2016-03-19 NOTE — Telephone Encounter (Signed)
Rx(s) sent to pharmacy electronically.  

## 2016-04-17 ENCOUNTER — Ambulatory Visit: Payer: Medicare Other

## 2016-04-23 ENCOUNTER — Ambulatory Visit
Admission: RE | Admit: 2016-04-23 | Discharge: 2016-04-23 | Disposition: A | Discharge status: Home / Self Care | Payer: Medicare Other | Source: Ambulatory Visit | Attending: Family Medicine | Admitting: Family Medicine

## 2016-04-23 DIAGNOSIS — Z1231 Encounter for screening mammogram for malignant neoplasm of breast: Secondary | ICD-10-CM

## 2016-04-25 ENCOUNTER — Other Ambulatory Visit: Payer: Self-pay | Admitting: Family Medicine

## 2016-04-25 DIAGNOSIS — R928 Other abnormal and inconclusive findings on diagnostic imaging of breast: Secondary | ICD-10-CM

## 2016-05-03 ENCOUNTER — Ambulatory Visit
Admission: RE | Admit: 2016-05-03 | Discharge: 2016-05-03 | Disposition: A | Discharge status: Home / Self Care | Payer: Medicare Other | Source: Ambulatory Visit | Attending: Family Medicine | Admitting: Family Medicine

## 2016-05-03 DIAGNOSIS — R928 Other abnormal and inconclusive findings on diagnostic imaging of breast: Secondary | ICD-10-CM

## 2016-07-09 ENCOUNTER — Other Ambulatory Visit: Payer: Self-pay | Admitting: Internal Medicine

## 2016-07-11 ENCOUNTER — Other Ambulatory Visit: Payer: Self-pay | Admitting: Internal Medicine

## 2016-07-11 NOTE — Telephone Encounter (Signed)
Rx(s) sent to pharmacy electronically.  

## 2016-11-26 ENCOUNTER — Encounter: Payer: Self-pay | Admitting: Internal Medicine

## 2017-01-06 ENCOUNTER — Other Ambulatory Visit: Payer: Self-pay | Admitting: Internal Medicine

## 2017-03-11 ENCOUNTER — Ambulatory Visit (INDEPENDENT_AMBULATORY_CARE_PROVIDER_SITE_OTHER): Payer: Medicare Other | Admitting: Internal Medicine

## 2017-03-11 VITALS — BP 140/78 | HR 71 | Ht 65.0 in | Wt 140.0 lb

## 2017-03-11 DIAGNOSIS — E785 Hyperlipidemia, unspecified: Secondary | ICD-10-CM | POA: Diagnosis not present

## 2017-03-11 DIAGNOSIS — I10 Essential (primary) hypertension: Secondary | ICD-10-CM | POA: Diagnosis not present

## 2017-03-11 DIAGNOSIS — I493 Ventricular premature depolarization: Secondary | ICD-10-CM | POA: Diagnosis not present

## 2017-03-11 NOTE — Progress Notes (Signed)
OFFICE NOTE  Chief Complaint:  Routine follow-up  Primary Care Physician: Cari Caraway, MD  HPI:  Shelley Flores  Is a 83 year old female who comes in for followup. She has hypertension and hyperlipidemia and had had an echocardiogram in 2010 that showed mild concentric LVH, mild AI, mild MR/TR with pulmonary artery pressure of 32. Her EF was normal. She has no specific cardiac complaints. Blood pressure and cholesterol have been well-controlled. She is due for recheck of her lipid profile. Unfortunately she does not get much exercise but she is looking into it and possibly considering the [employer] program.  I saw [name] back in the office today. Overall she is doing well except for some leg swelling at the end of the day. Denies any chest pain or worsening shortness of breath. EKG looks unchanged.  02/26/2016  [name] returns today for follow-up. Over the past year she is done fairly well. She denies any chest pain or worsening shortness of breath. Initially blood pressure was elevated 158/78 however recheck came down to 130/70. She is on low-dose aspirin. She is due for recheck of her lipid profile. She has not had any worsening palpitations.   03/11/2017  [name] returns today for follow-up.  She is done well over the past year.  She is since moved into [facility] [place].  She is very happy there.  This is an assisted living facility.  She denies any chest pain or worsening shortness of breath.  Blood pressure is improved today.  She gets annual lipid profiles and is due for that as well.  She denies any problems with her medications.  She says she is sleeping well.  The only issue is she had some imbalance however she has been working with physical therapy on that.  PMHx:  Past Medical History:  Diagnosis Date  . Bruit    Abdominal bruit - Abdominal aorta/renal duplex Doppler evaluation 12/07/03 -Mildly abnormal evaluation. *Celiac: At Rest, 165.2  cm/s; Inspiration 117.1 cm/s. This is consistant w/median arcuate ligament compression syndrome. *Right & Left Kidney: Essentially equal in size, symmetrical in shape w/no significant abnormalities visualized. *Right & Left Renal Arteries: No significant abnormalities.  . Edema extremities    LE edema   . H/O myocardial perfusion scan 02/20/00   To rule out ischemia - Negative adequate Bruce protocol exercise stress test with a deconditioned exercise response and normal static myocardial perfusion images with EF calculated by QGS of 77%. Represents a low risk study.  [name] Heart murmur    history of murmur  . Hip pain   . Hyperlipidemia   . Hypertension   . Lower back pain   . Osteoarthritis   . Osteoporosis   . Valvular heart disease    Mild valvular heart disease by Echo in 2010, all mild and symptomatic, including concentric LVH, MR, TR, and AI with pulmonary artery pressure of 32. EF was normal.    Past Surgical History:  Procedure Laterality Date  . Abdominal aorta/Renal duplex Doppler Evaluation  12/07/03   For abdominal bruit. Mildly abnormal evaluation. (See bruit in medical history)  . CATARACT EXTRACTION  2003  . COLONOSCOPY  10/22/2004  . DEXA Bone Scan  06/23/2013    FAMHx:  Family History  Problem Relation Age of Onset  . Leukemia Mother 1  . Cancer Father        esophagial cancer  . Heart failure Maternal Grandmother   . Tuberculosis Maternal Grandfather   . Heart disease  Paternal Grandmother   . Cancer Paternal Grandfather     SOCHx:   reports that she quit smoking about 54 years ago. Her smoking use included Cigarettes. She has a 1.50 pack-year smoking history. She does not have any smokeless tobacco history on file. She reports that she drinks about 1.0 oz of alcohol per week . She reports that she does not use drugs.  ALLERGIES:  No Known Allergies  ROS: Pertinent items noted in HPI and remainder of comprehensive ROS otherwise negative.  HOME MEDS: Current  Outpatient Prescriptions  Medication Sig Dispense Refill  . amLODipine (NORVASC) 5 MG tablet Take 1 tablet (5 mg total) by mouth daily. 90 tablet 0  . aspirin 81 MG tablet Take 81 mg by mouth daily.    [name] atorvastatin (LIPITOR) 20 MG tablet TAKE 1 TABLET BY MOUTH EVERY DAY 90 tablet 3  . Calcium-Vitamin D-Vitamin K (CALCIUM SOFT CHEWS PO) Take 500 mg by mouth daily.     . cholecalciferol (VITAMIN D) 1000 UNITS tablet Take 1,000 Units by mouth daily.    . Glucosamine HCl 1500 MG TABS Take by mouth daily.    . metoprolol succinate (TOPROL-XL) 50 MG 24 hr tablet TAKE 1 TABLET (50 MG TOTAL) BY MOUTH DAILY. TAKE WITH OR IMMEDIATELY FOLLOWING A MEAL. 90 tablet 2  . Multiple Vitamin (MULTIVITAMIN) tablet Take 1 tablet by mouth daily.    [name] alendronate (FOSAMAX) 70 MG tablet Take 1 tablet by mouth once a week.  4   No current facility-administered medications for this visit.     LABS/IMAGING: No results found for this or any previous visit (from the past 48 hour(s)). No results found.  VITALS: BP 140/78   Pulse 71   Ht 5\' 5"  (1.651 m)   Wt 140 lb (63.5 kg)   BMI 23.30 kg/m   EXAM: General appearance: alert and no distress Neck: no carotid bruit, no JVD and thyroid not enlarged, symmetric, no tenderness/mass/nodules Lungs: clear to auscultation bilaterally Heart: regular rate and rhythm Abdomen: soft, non-tender; bowel sounds normal; no masses,  no organomegaly Extremities: extremities normal, atraumatic, no cyanosis or edema Pulses: 2+ and symmetric Skin: Skin color, texture, turgor normal. No rashes or lesions Neurologic: Grossly normal Psych: Pleasant  EKG: Normal sinus rhythm at 71-personally reviewed  ASSESSMENT: 1. Hypertension 2. Dyslipidemia 3. Asymptomatic PVC's - no significant recurrence  PLAN: 1.   [name] has had recently well-controlled hypertension and cholesterol which was a goal a year ago.  She is due for repeat lipid profile.  In the past she has had  PVCs but was asymptomatic with this.  I do not see any evidence of that on today's EKG.  Overall she is doing well.  She has some imbalance with may be due to muscle weakness and is worked with PT on this.  Follow-up annually or sooner as necessary.  Pixie Casino, MD, [facility] Attending Cardiologist [place] 03/11/2017, 1:47 PM

## 2017-03-11 NOTE — Patient Instructions (Signed)
Your physician recommends that you return for lab work fasting to check cholesterol   Your physician wants you to follow-up in: ONE YEAR with Dr. Debara Pickett. You will receive a reminder letter in the mail two months in advance. If you don't receive a letter, please call our office to schedule the follow-up appointment.

## 2017-03-12 ENCOUNTER — Other Ambulatory Visit: Payer: Self-pay | Admitting: Internal Medicine

## 2017-03-12 LAB — LIPID PANEL
Chol/HDL Ratio: 2.8 ratio (ref 0.0–4.4)
Cholesterol, Total: 165 mg/dL (ref 100–199)
HDL: 58 mg/dL (ref >39)
LDL Calculated: 92 mg/dL (ref 0–99)
Triglycerides: 75 mg/dL (ref 0–149)
VLDL Cholesterol Cal: 15 mg/dL (ref 5–40)

## 2017-03-12 NOTE — Telephone Encounter (Signed)
REFILL 

## 2017-03-20 ENCOUNTER — Other Ambulatory Visit: Payer: Self-pay | Admitting: Family Medicine

## 2017-03-20 DIAGNOSIS — Z1231 Encounter for screening mammogram for malignant neoplasm of breast: Secondary | ICD-10-CM

## 2017-04-04 ENCOUNTER — Other Ambulatory Visit: Payer: Self-pay | Admitting: Internal Medicine

## 2017-04-07 ENCOUNTER — Other Ambulatory Visit: Payer: Self-pay | Admitting: Internal Medicine

## 2017-04-08 NOTE — Telephone Encounter (Signed)
REFILL 

## 2017-04-25 ENCOUNTER — Ambulatory Visit: Payer: Medicare Other

## 2017-05-23 ENCOUNTER — Ambulatory Visit
Admission: RE | Admit: 2017-05-23 | Discharge: 2017-05-23 | Disposition: A | Discharge status: Home / Self Care | Payer: Medicare Other | Source: Ambulatory Visit | Attending: Family Medicine | Admitting: Family Medicine

## 2017-05-23 DIAGNOSIS — Z1231 Encounter for screening mammogram for malignant neoplasm of breast: Secondary | ICD-10-CM

## 2018-02-02 ENCOUNTER — Other Ambulatory Visit: Payer: Self-pay | Admitting: Internal Medicine

## 2018-03-18 ENCOUNTER — Encounter: Payer: Self-pay | Admitting: Internal Medicine

## 2018-03-18 ENCOUNTER — Ambulatory Visit: Payer: Medicare Other | Admitting: Internal Medicine

## 2018-03-18 VITALS — BP 132/86 | HR 71 | Ht 63.0 in | Wt 144.0 lb

## 2018-03-18 DIAGNOSIS — R011 Cardiac murmur, unspecified: Secondary | ICD-10-CM | POA: Diagnosis not present

## 2018-03-18 DIAGNOSIS — E785 Hyperlipidemia, unspecified: Secondary | ICD-10-CM | POA: Diagnosis not present

## 2018-03-18 DIAGNOSIS — I10 Essential (primary) hypertension: Secondary | ICD-10-CM | POA: Diagnosis not present

## 2018-03-18 NOTE — Progress Notes (Signed)
OFFICE NOTE  Chief Complaint:  Routine follow-up  Primary Care Physician: Cari Caraway, MD  HPI:  Shelley Flores  Is a 84 year old female who comes in for followup. She has hypertension and hyperlipidemia and had had an echocardiogram in 2010 that showed mild concentric LVH, mild AI, mild MR/TR with pulmonary artery pressure of 32. Her EF was normal. She has no specific cardiac complaints. Blood pressure and cholesterol have been well-controlled. She is due for recheck of her lipid profile. Unfortunately she does not get much exercise but she is looking into it and possibly considering the [employer] program.  I saw [name] back in the office today. Overall she is doing well except for some leg swelling at the end of the day. Denies any chest pain or worsening shortness of breath. EKG looks unchanged.  02/26/2016  [name] returns today for follow-up. Over the past year she is done fairly well. She denies any chest pain or worsening shortness of breath. Initially blood pressure was elevated 158/78 however recheck came down to 130/70. She is on low-dose aspirin. She is due for recheck of her lipid profile. She has not had any worsening palpitations.   03/11/2017  [name] returns today for follow-up.  She is done well over the past year.  She is since moved into [facility] [place].  She is very happy there.  This is an assisted living facility.  She denies any chest pain or worsening shortness of breath.  Blood pressure is improved today.  She gets annual lipid profiles and is due for that as well.  She denies any problems with her medications.  She says she is sleeping well.  The only issue is she had some imbalance however she has been working with physical therapy on that.  03/18/2018  [name] is seen today in routine follow-up.  Overall she is doing well without new complaints.  She continues to live at [facility] [place].  She says she is not as active as she  like to be.  Weight has fluctuated some.  She denies any chest pain or shortness of breath.  EKG shows sinus rhythm.  Blood pressure is well controlled today.  PMHx:  Past Medical History:  Diagnosis Date  . Bruit    Abdominal bruit - Abdominal aorta/renal duplex Doppler evaluation 12/07/03 -Mildly abnormal evaluation. 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 This is consistant w/median arcuate ligament compression syndrome. *Right & Left Kidney: Essentially equal in size, symmetrical in shape w/no significant abnormalities visualized. *Right & Left Renal Arteries: No significant abnormalities.  . Edema extremities    LE edema   . H/O myocardial perfusion scan 02/20/00   To rule out ischemia - Negative adequate Bruce protocol exercise stress test with a deconditioned exercise response and normal static myocardial perfusion images with EF calculated by QGS of 77%. Represents a low risk study.  [name] Heart murmur    history of murmur  . Hip pain   . Hyperlipidemia   . Hypertension   . Lower back pain   . Osteoarthritis   . Osteoporosis   . Valvular heart disease    Mild valvular heart disease by Echo in 2010, all mild and symptomatic, including concentric LVH, MR, TR, and AI with pulmonary artery pressure of 32. EF was normal.    Past Surgical History:  Procedure Laterality Date  . Abdominal aorta/Renal duplex Doppler Evaluation  12/07/03   For abdominal bruit. Mildly abnormal evaluation. (See bruit in  medical history)  . BREAST EXCISIONAL BIOPSY Left 1966  . BREAST EXCISIONAL BIOPSY Left 1999  . CATARACT EXTRACTION  2003  . COLONOSCOPY  10/22/2004  . DEXA Bone Scan  06/23/2013    FAMHx:  Family History  Problem Relation Age of Onset  . Leukemia Mother 63  . Cancer Father        esophagial cancer  . Heart failure Maternal Grandmother   . Tuberculosis Maternal Grandfather   . Heart disease Paternal Grandmother   . Cancer Paternal Grandfather     SOCHx:   reports that  she quit smoking about 55 years ago. Her smoking use included cigarettes. She has a 1.50 pack-year smoking history. She has never used smokeless tobacco. She reports that she drinks about 2.0 standard drinks of alcohol per week. She reports that she does not use drugs.  ALLERGIES:  No Known Allergies  ROS: Pertinent items noted in HPI and remainder of comprehensive ROS otherwise negative.  HOME MEDS: Current Outpatient Medications  Medication Sig Dispense Refill  . alendronate (FOSAMAX) 70 MG tablet Take 1 tablet by mouth once a week.  4  . amLODipine (NORVASC) 5 MG tablet TAKE 1 TABLET BY MOUTH EVERY DAY 90 tablet 3  . aspirin 81 MG tablet Take 81 mg by mouth daily.    [name] atorvastatin (LIPITOR) 20 MG tablet TAKE 1 TABLET BY MOUTH EVERY DAY 90 tablet 0  . Calcium-Vitamin D-Vitamin K (CALCIUM SOFT CHEWS PO) Take 500 mg by mouth daily.     . cholecalciferol (VITAMIN D) 1000 UNITS tablet Take 1,000 Units by mouth daily.    . Glucosamine HCl 1500 MG TABS Take by mouth daily.    . metoprolol succinate (TOPROL-XL) 50 MG 24 hr tablet TAKE 1 TABLET (50 MG TOTAL) BY MOUTH DAILY. TAKE WITH OR IMMEDIATELY FOLLOWING A MEAL. 90 tablet 3  . Multiple Vitamin (MULTIVITAMIN) tablet Take 1 tablet by mouth daily.     No current facility-administered medications for this visit.     LABS/IMAGING: No results found for this or any previous visit (from the past 48 hour(s)). No results found.  VITALS: BP 132/86 (BP Location: Right Arm, Patient Position: Sitting, Cuff Size: Normal)   Pulse 71   Ht 5\' 3"  (1.6 m)   Wt 144 lb (65.3 kg)   BMI 25.51 kg/m   EXAM: General appearance: alert and no distress Neck: no carotid bruit, no JVD and thyroid not enlarged, symmetric, no tenderness/mass/nodules Lungs: clear to auscultation bilaterally Heart: regular rate and rhythm and systolic murmur: systolic ejection 2/6, crescendo at 2nd right intercostal space Abdomen: soft, non-tender; bowel sounds normal; no  masses,  no organomegaly Extremities: extremities normal, atraumatic, no cyanosis or edema Pulses: 2+ and symmetric Skin: Skin color, texture, turgor normal. No rashes or lesions Neurologic: Grossly normal Psych: Pleasant  EKG: Sinus rhythm at 71, low voltage QRS-personally reviewed  ASSESSMENT: 1. Hypertension 2. Dyslipidemia 3. Asymptomatic PVC's - no significant recurrence 4. History of valvular heart disease.  PLAN: 1.   Mrs. Herrington had no acute issues over the last year.  Her PVCs have been infrequent and well-tolerated.  Blood pressure is at goal.  Cholesterol is followed by her PCP.  Does have a remote history of valvular heart disease by echo in 2010.  She has a murmur on exam.  This is not been reassessed.  I would like for her to have a repeat echo.  No changes were made her medicines today.  Follow-up annually or sooner as  necessary.  Pixie Casino, MD, [facility], [place] Director of the Advanced Lipid Disorders &  Cardiovascular Risk Reduction Clinic Diplomate of the American Board of Clinical Lipidology Attending Cardiologist  Direct Dial: [phone]  Fax: [phone]  Website:  [email or web address] 03/18/2018, 1:55 PM

## 2018-03-18 NOTE — Patient Instructions (Signed)
Medication Instructions:  Continue current medications If you need a refill on your cardiac medications before your next appointment, please call your pharmacy.   Lab work: FASTING lab work to check cholesterol If you have labs (blood work) drawn today and your tests are completely normal, you will receive your results only by: [name] MyChart Message (if you have MyChart) OR . A paper copy in the mail If you have any lab test that is abnormal or we need to change your treatment, we will call you to review the results.  Testing/Procedures: Your physician has requested that you have an echocardiogram. Echocardiography is a painless test that uses sound waves to create images of your heart. It provides your doctor with information about the size and shape of your heart and how well your heart's chambers and valves are working. This procedure takes approximately one hour. There are no restrictions for this procedure. -- done at [address] - [address]  Follow-Up: At [facility], you and your health needs are our priority.  As part of our continuing mission to provide you with exceptional heart care, we have created designated Provider Care Teams.  These Care Teams include your primary Cardiologist (physician) and Advanced Practice Providers (APPs -  Physician Assistants and Nurse Practitioners) who all work together to provide you with the care you need, when you need it. You will need a follow up appointment in 12 months.  Please call our office 2 months in advance to schedule this appointment.  You may see Dr. Debara Pickett or one of the following Advanced Practice Providers on your designated Care Team: Almyra Deforest, [place] . Fabian Sharp, PA-C  Any Other Special Instructions Will Be Listed Below (If Applicable).

## 2018-03-20 ENCOUNTER — Encounter: Payer: Self-pay | Admitting: Internal Medicine

## 2018-03-20 LAB — LIPID PANEL
Chol/HDL Ratio: 3.2 ratio (ref 0.0–4.4)
Cholesterol, Total: 168 mg/dL (ref 100–199)
HDL: 53 mg/dL (ref >39)
LDL Calculated: 95 mg/dL (ref 0–99)
Triglycerides: 102 mg/dL (ref 0–149)
VLDL Cholesterol Cal: 20 mg/dL (ref 5–40)

## 2018-03-27 ENCOUNTER — Ambulatory Visit (HOSPITAL_COMMUNITY): Payer: Medicare Other | Attending: Cardiovascular Disease

## 2018-03-27 ENCOUNTER — Other Ambulatory Visit: Payer: Self-pay

## 2018-03-27 ENCOUNTER — Other Ambulatory Visit: Payer: Self-pay | Admitting: Internal Medicine

## 2018-03-27 DIAGNOSIS — R011 Cardiac murmur, unspecified: Secondary | ICD-10-CM

## 2018-03-28 ENCOUNTER — Other Ambulatory Visit: Payer: Self-pay | Admitting: Internal Medicine

## 2018-04-15 ENCOUNTER — Other Ambulatory Visit: Payer: Self-pay | Admitting: Family Medicine

## 2018-04-15 DIAGNOSIS — Z1231 Encounter for screening mammogram for malignant neoplasm of breast: Secondary | ICD-10-CM

## 2018-04-22 ENCOUNTER — Other Ambulatory Visit: Payer: Self-pay | Admitting: Internal Medicine

## 2018-04-22 NOTE — Telephone Encounter (Signed)
Rx(s) sent to pharmacy electronically.  

## 2018-05-27 ENCOUNTER — Ambulatory Visit
Admission: RE | Admit: 2018-05-27 | Discharge: 2018-05-27 | Disposition: A | Discharge status: Home / Self Care | Payer: Medicare Other | Source: Ambulatory Visit | Attending: Family Medicine | Admitting: Family Medicine

## 2018-05-27 DIAGNOSIS — Z1231 Encounter for screening mammogram for malignant neoplasm of breast: Secondary | ICD-10-CM

## 2019-03-23 ENCOUNTER — Other Ambulatory Visit: Payer: Self-pay | Admitting: Internal Medicine

## 2019-03-24 ENCOUNTER — Other Ambulatory Visit: Payer: Self-pay

## 2019-03-24 ENCOUNTER — Ambulatory Visit: Payer: Medicare Other | Admitting: Internal Medicine

## 2019-03-24 ENCOUNTER — Encounter: Payer: Self-pay | Admitting: Internal Medicine

## 2019-03-24 VITALS — BP 179/84 | HR 72 | Ht 63.0 in | Wt 142.2 lb

## 2019-03-24 DIAGNOSIS — I10 Essential (primary) hypertension: Secondary | ICD-10-CM | POA: Diagnosis not present

## 2019-03-24 DIAGNOSIS — Z79899 Other long term (current) drug therapy: Secondary | ICD-10-CM

## 2019-03-24 DIAGNOSIS — E785 Hyperlipidemia, unspecified: Secondary | ICD-10-CM

## 2019-03-24 MED ORDER — HYDROCHLOROTHIAZIDE 12.5 MG PO CAPS: 12.5000 mg | ORAL_CAPSULE | Freq: Every day | ORAL | 3 refills | Status: DC

## 2019-03-24 NOTE — Progress Notes (Signed)
OFFICE NOTE  Chief Complaint:  Routine follow-up, pedal edema  Primary Care Physician: Cari Caraway, MD  HPI:  Shelley Flores  Is a 85 year old female who comes in for followup. She has hypertension and hyperlipidemia and had had an echocardiogram in 2010 that showed mild concentric LVH, mild AI, mild MR/TR with pulmonary artery pressure of 32. Her EF was normal. She has no specific cardiac complaints. Blood pressure and cholesterol have been well-controlled. She is due for recheck of her lipid profile. Unfortunately she does not get much exercise but she is looking into it and possibly considering the [employer] program.  I saw [name] back in the office today. Overall she is doing well except for some leg swelling at the end of the day. Denies any chest pain or worsening shortness of breath. EKG looks unchanged.  02/26/2016  [name] returns today for follow-up. Over the past year she is done fairly well. She denies any chest pain or worsening shortness of breath. Initially blood pressure was elevated 158/78 however recheck came down to 130/70. She is on low-dose aspirin. She is due for recheck of her lipid profile. She has not had any worsening palpitations.   03/11/2017  [name] returns today for follow-up.  She is done well over the past year.  She is since moved into [facility] [place].  She is very happy there.  This is an assisted living facility.  She denies any chest pain or worsening shortness of breath.  Blood pressure is improved today.  She gets annual lipid profiles and is due for that as well.  She denies any problems with her medications.  She says she is sleeping well.  The only issue is she had some imbalance however she has been working with physical therapy on that.  03/18/2018  [name] is seen today in routine follow-up.  Overall she is doing well without new complaints.  She continues to live at [facility] [place].  She says she is not as  active as she like to be.  Weight has fluctuated some.  She denies any chest pain or shortness of breath.  EKG shows sinus rhythm.  Blood pressure is well controlled today.  03/24/2019  [name] is seen today in follow-up.  She denies any new chest pain or worsening shortness of breath.  Her most recent lipid profile showed total cholesterol 168, triglycerides 102, HDL 53 and LDL 95.  This is actually at target with goal LDL less than 100.  The study however was almost a year ago.  She remains on a atorvastatin 20 mg.  Blood pressure is significantly elevated today at 179/84.  She brought a list of blood pressure [facility] that [facility] indicating her blood pressure was 120/80 generally by manual cuff.  Her personal automatic cuff however shows a blood pressure to be about 016-010 systolic.  She also is complaining of some lower extremity edema, particularly pedal edema which is worsened recently and does not necessarily go away with elevating her feet at night.  PMHx:  Past Medical History:  Diagnosis Date  . Bruit    Abdominal bruit - Abdominal aorta/renal duplex Doppler evaluation 12/07/03 -Mildly abnormal evaluation. 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 This is consistant w/median arcuate ligament compression syndrome. *Right & Left Kidney: Essentially equal in size, symmetrical in shape w/no significant abnormalities visualized. *Right & Left Renal Arteries: No significant abnormalities.  . Edema extremities    LE edema   .  H/O myocardial perfusion scan 02/20/00   To rule out ischemia - Negative adequate Bruce protocol exercise stress test with a deconditioned exercise response and normal static myocardial perfusion images with EF calculated by QGS of 77%. Represents a low risk study.  [name] Heart murmur    history of murmur  . Hip pain   . Hyperlipidemia   . Hypertension   . Lower back pain   . Osteoarthritis   . Osteoporosis   . Valvular heart disease     Mild valvular heart disease by Echo in 2010, all mild and symptomatic, including concentric LVH, MR, TR, and AI with pulmonary artery pressure of 32. EF was normal.    Past Surgical History:  Procedure Laterality Date  . Abdominal aorta/Renal duplex Doppler Evaluation  12/07/03   For abdominal bruit. Mildly abnormal evaluation. (See bruit in medical history)  . BREAST EXCISIONAL BIOPSY Left 1966  . BREAST EXCISIONAL BIOPSY Left 1999  . CATARACT EXTRACTION  2003  . COLONOSCOPY  10/22/2004  . DEXA Bone Scan  06/23/2013    FAMHx:  Family History  Problem Relation Age of Onset  . Leukemia Mother 44  . Cancer Father        esophagial cancer  . Heart failure Maternal Grandmother   . Tuberculosis Maternal Grandfather   . Heart disease Paternal Grandmother   . Cancer Paternal Grandfather     SOCHx:   reports that she quit smoking about 56 years ago. Her smoking use included cigarettes. She has a 1.50 pack-year smoking history. She has never used smokeless tobacco. She reports current alcohol use of about 2.0 standard drinks of alcohol per week. She reports that she does not use drugs.  ALLERGIES:  No Known Allergies  ROS: Pertinent items noted in HPI and remainder of comprehensive ROS otherwise negative.  HOME MEDS: Current Outpatient Medications  Medication Sig Dispense Refill  . alendronate (FOSAMAX) 70 MG tablet Take 1 tablet by mouth once a week.  4  . amLODipine (NORVASC) 5 MG tablet TAKE 1 TABLET BY MOUTH EVERY DAY 90 tablet 4  . aspirin 81 MG tablet Take 81 mg by mouth daily.    [name] atorvastatin (LIPITOR) 20 MG tablet TAKE 1 TABLET BY MOUTH EVERY DAY 90 tablet 3  . Calcium-Vitamin D-Vitamin K (CALCIUM SOFT CHEWS PO) Take 500 mg by mouth daily.     . cholecalciferol (VITAMIN D) 1000 UNITS tablet Take 1,000 Units by mouth daily.    . Glucosamine HCl 1000 MG TABS Take 1,000 mg by mouth daily.     . metoprolol succinate (TOPROL-XL) 50 MG 24 hr tablet TAKE 1 TABLET (50 MG TOTAL)  BY MOUTH DAILY. TAKE WITH OR IMMEDIATELY FOLLOWING A MEAL. 90 tablet 3  . Multiple Vitamin (MULTIVITAMIN) tablet Take 1 tablet by mouth daily.     No current facility-administered medications for this visit.     LABS/IMAGING: No results found for this or any previous visit (from the past 48 hour(s)). No results found.  VITALS: BP (!) 179/84   Pulse 72   Ht 5\' 3"  (1.6 m)   Wt 142 lb 3.2 oz (64.5 kg)   SpO2 99%   BMI 25.19 kg/m   EXAM: General appearance: alert and no distress Neck: no carotid bruit, no JVD and thyroid not enlarged, symmetric, no tenderness/mass/nodules Lungs: clear to auscultation bilaterally Heart: regular rate and rhythm and systolic murmur: systolic ejection 2/6, crescendo at 2nd right intercostal space Abdomen: soft, non-tender; bowel sounds normal; no masses,  no organomegaly Extremities: extremities normal, atraumatic, no cyanosis or edema Pulses: 2+ and symmetric Skin: Skin color, texture, turgor normal. No rashes or lesions Neurologic: Grossly normal Psych: Pleasant  EKG: Normal sinus rhythm 72, low voltage QRS-personally reviewed  ASSESSMENT: 1. Hypertension 2. Dyslipidemia 3. Asymptomatic PVC's - no significant recurrence 4. History of valvular heart disease.  PLAN: 1.   [name] is noted to have hypertension which is uncontrolled today however her values taken by [facility] blood [facility] were normal.  Her automatic cuff however is higher.  I have asked her to work to correlate the 2 but I suspect her blood pressure is actually somewhere in between our readings.  She also has had some persistent worsening of her lower extremity/pedal edema.  I will plan to go ahead and start low-dose thiazide diuretic-hydrochlorothiazide 12.5 mg daily.  Plan check a metabolic profile in about a week and will check a fasting lipid profile at that time.  Follow-up annually or sooner as necessary.  Pixie Casino, MD, [facility], [place] Director of the Advanced Lipid Disorders &  Cardiovascular Risk Reduction Clinic Diplomate of the American Board of Clinical Lipidology Attending Cardiologist  Direct Dial: [phone]  Fax: [phone]  Website:  [email or web address] 03/24/2019, 1:53 PM

## 2019-03-24 NOTE — Patient Instructions (Signed)
Medication Instructions:  START hydrochlorothiazide Continue other current medications *If you need a refill on your cardiac medications before your next appointment, please call your pharmacy*  Lab Work: FASTING lab work end of next week - BMET, lipid panel If you have labs (blood work) drawn today and your tests are completely normal, you will receive your results only by: [name] MyChart Message (if you have MyChart) OR . A paper copy in the mail If you have any lab test that is abnormal or we need to change your treatment, we will call you to review the results.  Testing/Procedures: NONE  Follow-Up: At [facility], you and your health needs are our priority.  As part of our continuing mission to provide you with exceptional heart care, we have created designated Provider Care Teams.  These Care Teams include your primary Cardiologist (physician) and Advanced Practice Providers (APPs -  Physician Assistants and Nurse Practitioners) who all work together to provide you with the care you need, when you need it.  Your next appointment:   12 months  The format for your next appointment:   In Person  Provider:   You may see Dr. Debara Pickett or one of the following Advanced Practice Providers on your designated Care Team:    Almyra Deforest, PA-C  Fabian Sharp, [place] or   Roby Lofts, [place]   Other Instructions

## 2019-04-05 LAB — BASIC METABOLIC PANEL
BUN/Creatinine Ratio: 24 (ref 12–28)
BUN: 20 mg/dL (ref 8–27)
CO2: 27 mmol/L (ref 20–29)
Calcium: 11 mg/dL — ABNORMAL HIGH (ref 8.7–10.3)
Chloride: 101 mmol/L (ref 96–106)
Creatinine, Ser: 0.84 mg/dL (ref 0.57–1.00)
GFR calc Af Amer: 74 mL/min/{1.73_m2} (ref >59)
GFR calc non Af Amer: 64 mL/min/{1.73_m2} (ref >59)
Glucose: 93 mg/dL (ref 65–99)
Potassium: 4 mmol/L (ref 3.5–5.2)
Sodium: 141 mmol/L (ref 134–144)

## 2019-04-05 LAB — LIPID PANEL
Chol/HDL Ratio: 3.1 ratio (ref 0.0–4.4)
Cholesterol, Total: 167 mg/dL (ref 100–199)
HDL: 54 mg/dL (ref >39)
LDL Chol Calc (NIH): 96 mg/dL (ref 0–99)
Triglycerides: 90 mg/dL (ref 0–149)
VLDL Cholesterol Cal: 17 mg/dL (ref 5–40)

## 2019-04-28 ENCOUNTER — Other Ambulatory Visit: Payer: Self-pay | Admitting: Family Medicine

## 2019-04-28 DIAGNOSIS — Z1231 Encounter for screening mammogram for malignant neoplasm of breast: Secondary | ICD-10-CM

## 2019-04-30 ENCOUNTER — Other Ambulatory Visit: Payer: Self-pay | Admitting: Internal Medicine

## 2019-06-20 ENCOUNTER — Other Ambulatory Visit: Payer: Self-pay | Admitting: Internal Medicine

## 2019-07-29 ENCOUNTER — Ambulatory Visit: Payer: Medicare Other

## 2019-07-29 ENCOUNTER — Ambulatory Visit: Payer: Medicare PPO

## 2019-08-16 ENCOUNTER — Other Ambulatory Visit: Payer: Self-pay

## 2019-08-16 ENCOUNTER — Ambulatory Visit
Admission: RE | Admit: 2019-08-16 | Discharge: 2019-08-16 | Disposition: A | Discharge status: Home / Self Care | Payer: Medicare PPO | Source: Ambulatory Visit | Attending: Family Medicine | Admitting: Family Medicine

## 2019-08-16 DIAGNOSIS — Z1231 Encounter for screening mammogram for malignant neoplasm of breast: Secondary | ICD-10-CM

## 2019-11-04 ENCOUNTER — Other Ambulatory Visit: Payer: Self-pay | Admitting: Family Medicine

## 2019-11-04 DIAGNOSIS — M810 Age-related osteoporosis without current pathological fracture: Secondary | ICD-10-CM

## 2020-01-20 ENCOUNTER — Other Ambulatory Visit: Payer: Self-pay | Admitting: Internal Medicine

## 2020-01-26 ENCOUNTER — Ambulatory Visit
Admission: RE | Admit: 2020-01-26 | Discharge: 2020-01-26 | Disposition: A | Discharge status: Home / Self Care | Payer: Medicare PPO | Source: Ambulatory Visit | Attending: Family Medicine | Admitting: Family Medicine

## 2020-01-26 ENCOUNTER — Other Ambulatory Visit: Payer: Self-pay

## 2020-01-26 DIAGNOSIS — M8588 Other specified disorders of bone density and structure, other site: Secondary | ICD-10-CM | POA: Diagnosis not present

## 2020-01-26 DIAGNOSIS — Z780 Asymptomatic menopausal state: Secondary | ICD-10-CM | POA: Diagnosis not present

## 2020-01-26 DIAGNOSIS — M810 Age-related osteoporosis without current pathological fracture: Secondary | ICD-10-CM

## 2020-03-09 ENCOUNTER — Other Ambulatory Visit: Payer: Self-pay | Admitting: Internal Medicine

## 2020-03-24 ENCOUNTER — Other Ambulatory Visit: Payer: Self-pay

## 2020-03-24 ENCOUNTER — Encounter: Payer: Self-pay | Admitting: Internal Medicine

## 2020-03-24 ENCOUNTER — Ambulatory Visit: Payer: Medicare PPO | Admitting: Internal Medicine

## 2020-03-24 VITALS — BP 152/84 | HR 80 | Ht 64.0 in | Wt 145.0 lb

## 2020-03-24 DIAGNOSIS — E785 Hyperlipidemia, unspecified: Secondary | ICD-10-CM

## 2020-03-24 DIAGNOSIS — I10 Essential (primary) hypertension: Secondary | ICD-10-CM | POA: Diagnosis not present

## 2020-03-24 DIAGNOSIS — R600 Localized edema: Secondary | ICD-10-CM

## 2020-03-24 DIAGNOSIS — Z79899 Other long term (current) drug therapy: Secondary | ICD-10-CM | POA: Diagnosis not present

## 2020-03-24 MED ORDER — IRBESARTAN-HYDROCHLOROTHIAZIDE 150-12.5 MG PO TABS: 1.0000 | ORAL_TABLET | Freq: Every day | ORAL | 3 refills | Status: DC

## 2020-03-24 NOTE — Progress Notes (Signed)
OFFICE NOTE  Chief Complaint:  Routine follow-up, pedal edema  Primary Care Physician: Cari Caraway, MD  HPI:  Shelley Flores  Is a 86 year old female who comes in for followup. She has hypertension and hyperlipidemia and had had an echocardiogram in 2010 that showed mild concentric LVH, mild AI, mild MR/TR with pulmonary artery pressure of 32. Her EF was normal. She has no specific cardiac complaints. Blood pressure and cholesterol have been well-controlled. She is due for recheck of her lipid profile. Unfortunately she does not get much exercise but she is looking into it and possibly considering the [employer] program.  I saw [name] back in the office today. Overall she is doing well except for some leg swelling at the end of the day. Denies any chest pain or worsening shortness of breath. EKG looks unchanged.  02/26/2016  [name] returns today for follow-up. Over the past year she is done fairly well. She denies any chest pain or worsening shortness of breath. Initially blood pressure was elevated 158/78 however recheck came down to 130/70. She is on low-dose aspirin. She is due for recheck of her lipid profile. She has not had any worsening palpitations.   03/11/2017  [name] returns today for follow-up.  She is done well over the past year.  She is since moved into [facility] [place].  She is very happy there.  This is an assisted living facility.  She denies any chest pain or worsening shortness of breath.  Blood pressure is improved today.  She gets annual lipid profiles and is due for that as well.  She denies any problems with her medications.  She says she is sleeping well.  The only issue is she had some imbalance however she has been working with physical therapy on that.  03/18/2018  [name] is seen today in routine follow-up.  Overall she is doing well without new complaints.  She continues to live at [facility] [place].  She says she is not as  active as she like to be.  Weight has fluctuated some.  She denies any chest pain or shortness of breath.  EKG shows sinus rhythm.  Blood pressure is well controlled today.  03/24/2019  [name] is seen today in follow-up.  She denies any new chest pain or worsening shortness of breath.  Her most recent lipid profile showed total cholesterol 168, triglycerides 102, HDL 53 and LDL 95.  This is actually at target with goal LDL less than 100.  The study however was almost a year ago.  She remains on a atorvastatin 20 mg.  Blood pressure is significantly elevated today at 179/84.  She brought a list of blood pressure readings that were taken at [facility] indicating her blood pressure was 120/80 generally by manual cuff.  Her personal automatic cuff however shows a blood pressure to be about 275-170 systolic.  She also is complaining of some lower extremity edema, particularly pedal edema which is worsened recently and does not necessarily go away with elevating her feet at night.  03/24/2020  Shelley Flores returns today for follow-up.  She continues to struggle with lower extremity edema and I think suboptimal blood pressure control.  I had previously put her on some thiazide diuretic however she does not seem like she has had significant improvement with that.  Pressure here today was 152/84.  She says at home she gets around 017 systolic.  At [facility] clinic [facility] they get around 120 but  she doubts that number.  PMHx:  Past Medical History:  Diagnosis Date  . Bruit    Abdominal bruit - Abdominal aorta/renal duplex Doppler evaluation 12/07/03 -Mildly abnormal evaluation. 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 This is consistant w/median arcuate ligament compression syndrome. *Right & Left Kidney: Essentially equal in size, symmetrical in shape w/no significant abnormalities visualized. *Right & Left Renal Arteries: No significant abnormalities.  . Edema extremities    LE edema   .  H/O myocardial perfusion scan 02/20/00   To rule out ischemia - Negative adequate Bruce protocol exercise stress test with a deconditioned exercise response and normal static myocardial perfusion images with EF calculated by QGS of 77%. Represents a low risk study.  [name] Heart murmur    history of murmur  . Hip pain   . Hyperlipidemia   . Hypertension   . Lower back pain   . Osteoarthritis   . Osteoporosis   . Valvular heart disease    Mild valvular heart disease by Echo in 2010, all mild and symptomatic, including concentric LVH, MR, TR, and AI with pulmonary artery pressure of 32. EF was normal.    Past Surgical History:  Procedure Laterality Date  . Abdominal aorta/Renal duplex Doppler Evaluation  12/07/03   For abdominal bruit. Mildly abnormal evaluation. (See bruit in medical history)  . BREAST EXCISIONAL BIOPSY Left 1966  . BREAST EXCISIONAL BIOPSY Left 1999  . CATARACT EXTRACTION  2003  . COLONOSCOPY  10/22/2004  . DEXA Bone Scan  06/23/2013    FAMHx:  Family History  Problem Relation Age of Onset  . Leukemia Mother 31  . Cancer Father        esophagial cancer  . Heart failure Maternal Grandmother   . Tuberculosis Maternal Grandfather   . Heart disease Paternal Grandmother   . Cancer Paternal Grandfather     SOCHx:   reports that she quit smoking about 57 years ago. Her smoking use included cigarettes. She has a 1.50 pack-year smoking history. She has never used smokeless tobacco. She reports current alcohol use of about 2.0 standard drinks of alcohol per week. She reports that she does not use drugs.  ALLERGIES:  No Known Allergies  ROS: Pertinent items noted in HPI and remainder of comprehensive ROS otherwise negative.  HOME MEDS: Current Outpatient Medications  Medication Sig Dispense Refill  . alendronate (FOSAMAX) 70 MG tablet Take 1 tablet by mouth once a week.  4  . amLODipine (NORVASC) 5 MG tablet TAKE 1 TABLET BY MOUTH EVERY DAY 90 tablet 2  . aspirin 81  MG tablet Take 81 mg by mouth daily.    [name] atorvastatin (LIPITOR) 20 MG tablet TAKE 1 TABLET BY MOUTH EVERY DAY 90 tablet 1  . Calcium-Vitamin D-Vitamin K (CALCIUM SOFT CHEWS PO) Take 500 mg by mouth daily.     . Glucosamine HCl 1000 MG TABS Take 1,000 mg by mouth daily.     . hydrochlorothiazide (MICROZIDE) 12.5 MG capsule TAKE 1 CAPSULE BY MOUTH EVERY DAY 90 capsule 3  . metoprolol succinate (TOPROL-XL) 50 MG 24 hr tablet TAKE 1 TABLET (50 MG TOTAL) BY MOUTH DAILY. TAKE WITH OR IMMEDIATELY FOLLOWING A MEAL. 90 tablet 3  . Multiple Vitamin (MULTIVITAMIN) tablet Take 1 tablet by mouth daily.    . cholecalciferol (VITAMIN D) 1000 UNITS tablet Take 1,000 Units by mouth daily.     No current facility-administered medications for this visit.    LABS/IMAGING: No results found for this or  any previous visit (from the past 48 hour(s)). No results found.  VITALS: BP (!) 152/84 (BP Location: Left Arm, Patient Position: Sitting)   Pulse 80   Ht 5\' 4"  (1.626 m)   Wt 145 lb (65.8 kg)   SpO2 95%   BMI 24.89 kg/m   EXAM: General appearance: alert and no distress Neck: no carotid bruit, no JVD and thyroid not enlarged, symmetric, no tenderness/mass/nodules Lungs: clear to auscultation bilaterally Heart: regular rate and rhythm and systolic murmur: systolic ejection 2/6, crescendo at 2nd right intercostal space Abdomen: soft, non-tender; bowel sounds normal; no masses,  no organomegaly Extremities: extremities normal, atraumatic, no cyanosis or edema Pulses: 2+ and symmetric Skin: Skin color, texture, turgor normal. No rashes or lesions Neurologic: Grossly normal Psych: Pleasant  EKG: Normal sinus rhythm at 80, possible left atrial margin-personally reviewed  ASSESSMENT: 1. Hypertension 2. Dyslipidemia 3. Asymptomatic PVC's - no significant recurrence 4. History of valvular heart disease.  5. Bilateral LE edema  PLAN: 1.   Shelley Flores continues to struggle with lower extremity edema  which she says does not go away with elevating her feet at night.  I do think that the calcium channel blocker can be contributing to this.  I would recommend stopping amlodipine and switching her to irbesartan/HCTZ 150/12.5 mg daily.  She will continue on her additional HCTZ 12.5 mg daily for more diuretic benefit.  Also continue metoprolol.  We will plan to repeat metabolic profile and lipid profile since she is overdue and follow-up with me in about 2 months.  Pixie Casino, MD, [facility], [place] Director of the Advanced Lipid Disorders &  Cardiovascular Risk Reduction Clinic Diplomate of the American Board of Clinical Lipidology Attending Cardiologist  Direct Dial: [phone]  Fax: [phone]  Website:  [email or web address] Shelley Flores 03/24/2020, 11:36 AM

## 2020-03-24 NOTE — Patient Instructions (Signed)
Medication Instructions:  STOP amlodipine START irbesartan-hctz 150-12.5mg  daily CONTINUE all other current medications  *If you need a refill on your cardiac medications before your next appointment, please call your pharmacy*   Lab Work: FASTING - BMET & LIPID PANEL in 1-2 weeks   If you have labs (blood work) drawn today and your tests are completely normal, you will receive your results only by: [name] MyChart Message (if you have MyChart) OR . A paper copy in the mail If you have any lab test that is abnormal or we need to change your treatment, we will call you to review the results.  Follow-Up: At [facility], you and your health needs are our priority.  As part of our continuing mission to provide you with exceptional heart care, we have created designated Provider Care Teams.  These Care Teams include your primary Cardiologist (physician) and Advanced Practice Providers (APPs -  Physician Assistants and Nurse Practitioners) who all work together to provide you with the care you need, when you need it.  We recommend signing up for the patient portal called "MyChart".  Sign up information is provided on this After Visit Summary.  MyChart is used to connect with patients for Virtual Visits (Telemedicine).  Patients are able to view lab/test results, encounter notes, upcoming appointments, etc.  Non-urgent messages can be sent to your provider as well.   To learn more about what you can do with MyChart, go to [email or web address].    Your next appointment:   2-3 month(s)  The format for your next appointment:   In Person  Provider:   You may see Dr. Debara Pickett or one of the following Advanced Practice Providers on your designated Care Team:    Almyra Deforest, PA-C  Fabian Sharp, [place] or   Roby Lofts, [place]    Other Instructions

## 2020-04-11 DIAGNOSIS — R059 Cough, unspecified: Secondary | ICD-10-CM | POA: Diagnosis not present

## 2020-04-12 DIAGNOSIS — J90 Pleural effusion, not elsewhere classified: Secondary | ICD-10-CM

## 2020-04-12 HISTORY — DX: Pleural effusion, not elsewhere classified: J90

## 2020-04-14 DIAGNOSIS — E785 Hyperlipidemia, unspecified: Secondary | ICD-10-CM | POA: Diagnosis not present

## 2020-04-14 DIAGNOSIS — Z79899 Other long term (current) drug therapy: Secondary | ICD-10-CM | POA: Diagnosis not present

## 2020-04-14 LAB — BASIC METABOLIC PANEL
BUN/Creatinine Ratio: 14 (ref 12–28)
BUN: 12 mg/dL (ref 8–27)
CO2: 25 mmol/L (ref 20–29)
Calcium: 10.6 mg/dL — ABNORMAL HIGH (ref 8.7–10.3)
Chloride: 99 mmol/L (ref 96–106)
Creatinine, Ser: 0.86 mg/dL (ref 0.57–1.00)
GFR calc Af Amer: 71 mL/min/{1.73_m2} (ref >59)
GFR calc non Af Amer: 62 mL/min/{1.73_m2} (ref >59)
Glucose: 99 mg/dL (ref 65–99)
Potassium: 4 mmol/L (ref 3.5–5.2)
Sodium: 138 mmol/L (ref 134–144)

## 2020-04-14 LAB — LIPID PANEL
Chol/HDL Ratio: 2.6 ratio (ref 0.0–4.4)
Cholesterol, Total: 139 mg/dL (ref 100–199)
HDL: 53 mg/dL (ref >39)
LDL Chol Calc (NIH): 68 mg/dL (ref 0–99)
Triglycerides: 97 mg/dL (ref 0–149)
VLDL Cholesterol Cal: 18 mg/dL (ref 5–40)

## 2020-04-21 DIAGNOSIS — R053 Chronic cough: Secondary | ICD-10-CM | POA: Diagnosis not present

## 2020-04-28 ENCOUNTER — Other Ambulatory Visit: Payer: Self-pay | Admitting: Family Medicine

## 2020-04-28 ENCOUNTER — Other Ambulatory Visit: Payer: Self-pay

## 2020-04-28 ENCOUNTER — Ambulatory Visit
Admission: RE | Admit: 2020-04-28 | Discharge: 2020-04-28 | Disposition: A | Discharge status: Home / Self Care | Payer: Medicare PPO | Source: Ambulatory Visit | Attending: Family Medicine | Admitting: Family Medicine

## 2020-04-28 DIAGNOSIS — R053 Chronic cough: Secondary | ICD-10-CM

## 2020-04-28 DIAGNOSIS — R0602 Shortness of breath: Secondary | ICD-10-CM | POA: Diagnosis not present

## 2020-04-28 DIAGNOSIS — R059 Cough, unspecified: Secondary | ICD-10-CM | POA: Diagnosis not present

## 2020-05-01 ENCOUNTER — Emergency Department (HOSPITAL_COMMUNITY): Payer: Medicare PPO

## 2020-05-01 ENCOUNTER — Other Ambulatory Visit (HOSPITAL_COMMUNITY): Payer: Self-pay | Admitting: Family Medicine

## 2020-05-01 ENCOUNTER — Other Ambulatory Visit: Payer: Self-pay

## 2020-05-01 ENCOUNTER — Inpatient Hospital Stay (HOSPITAL_COMMUNITY)
Admission: EM | Admit: 2020-05-01 | Discharge: 2020-05-12 | DRG: 180 | Disposition: A | Discharge status: Skilled Nursing Facility | Payer: Medicare PPO | Attending: Internal Medicine | Admitting: Internal Medicine

## 2020-05-01 DIAGNOSIS — Z806 Family history of leukemia: Secondary | ICD-10-CM

## 2020-05-01 DIAGNOSIS — R0602 Shortness of breath: Secondary | ICD-10-CM | POA: Diagnosis not present

## 2020-05-01 DIAGNOSIS — R1319 Other dysphagia: Secondary | ICD-10-CM | POA: Diagnosis not present

## 2020-05-01 DIAGNOSIS — K7689 Other specified diseases of liver: Secondary | ICD-10-CM | POA: Diagnosis not present

## 2020-05-01 DIAGNOSIS — Z20822 Contact with and (suspected) exposure to covid-19: Secondary | ICD-10-CM | POA: Diagnosis present

## 2020-05-01 DIAGNOSIS — E785 Hyperlipidemia, unspecified: Secondary | ICD-10-CM | POA: Diagnosis present

## 2020-05-01 DIAGNOSIS — M6281 Muscle weakness (generalized): Secondary | ICD-10-CM | POA: Diagnosis not present

## 2020-05-01 DIAGNOSIS — R9389 Abnormal findings on diagnostic imaging of other specified body structures: Secondary | ICD-10-CM | POA: Diagnosis present

## 2020-05-01 DIAGNOSIS — Z743 Need for continuous supervision: Secondary | ICD-10-CM | POA: Diagnosis not present

## 2020-05-01 DIAGNOSIS — R011 Cardiac murmur, unspecified: Secondary | ICD-10-CM | POA: Diagnosis not present

## 2020-05-01 DIAGNOSIS — R5381 Other malaise: Secondary | ICD-10-CM | POA: Diagnosis not present

## 2020-05-01 DIAGNOSIS — I38 Endocarditis, valve unspecified: Secondary | ICD-10-CM | POA: Diagnosis present

## 2020-05-01 DIAGNOSIS — I700 Atherosclerosis of aorta: Secondary | ICD-10-CM | POA: Diagnosis present

## 2020-05-01 DIAGNOSIS — Z66 Do not resuscitate: Secondary | ICD-10-CM | POA: Diagnosis present

## 2020-05-01 DIAGNOSIS — C782 Secondary malignant neoplasm of pleura: Secondary | ICD-10-CM | POA: Diagnosis present

## 2020-05-01 DIAGNOSIS — N281 Cyst of kidney, acquired: Secondary | ICD-10-CM | POA: Diagnosis not present

## 2020-05-01 DIAGNOSIS — R188 Other ascites: Secondary | ICD-10-CM | POA: Diagnosis present

## 2020-05-01 DIAGNOSIS — Z7982 Long term (current) use of aspirin: Secondary | ICD-10-CM

## 2020-05-01 DIAGNOSIS — R591 Generalized enlarged lymph nodes: Secondary | ICD-10-CM | POA: Diagnosis present

## 2020-05-01 DIAGNOSIS — M1990 Unspecified osteoarthritis, unspecified site: Secondary | ICD-10-CM | POA: Diagnosis present

## 2020-05-01 DIAGNOSIS — J9811 Atelectasis: Secondary | ICD-10-CM | POA: Diagnosis present

## 2020-05-01 DIAGNOSIS — J189 Pneumonia, unspecified organism: Secondary | ICD-10-CM | POA: Diagnosis not present

## 2020-05-01 DIAGNOSIS — C3490 Malignant neoplasm of unspecified part of unspecified bronchus or lung: Principal | ICD-10-CM | POA: Diagnosis present

## 2020-05-01 DIAGNOSIS — Z87891 Personal history of nicotine dependence: Secondary | ICD-10-CM

## 2020-05-01 DIAGNOSIS — R058 Other specified cough: Secondary | ICD-10-CM | POA: Diagnosis not present

## 2020-05-01 DIAGNOSIS — R2681 Unsteadiness on feet: Secondary | ICD-10-CM | POA: Diagnosis not present

## 2020-05-01 DIAGNOSIS — R630 Anorexia: Secondary | ICD-10-CM | POA: Diagnosis present

## 2020-05-01 DIAGNOSIS — C7800 Secondary malignant neoplasm of unspecified lung: Secondary | ICD-10-CM | POA: Diagnosis present

## 2020-05-01 DIAGNOSIS — J9601 Acute respiratory failure with hypoxia: Secondary | ICD-10-CM | POA: Diagnosis not present

## 2020-05-01 DIAGNOSIS — M545 Low back pain, unspecified: Secondary | ICD-10-CM | POA: Diagnosis present

## 2020-05-01 DIAGNOSIS — J90 Pleural effusion, not elsewhere classified: Secondary | ICD-10-CM

## 2020-05-01 DIAGNOSIS — M810 Age-related osteoporosis without current pathological fracture: Secondary | ICD-10-CM | POA: Diagnosis present

## 2020-05-01 DIAGNOSIS — R0689 Other abnormalities of breathing: Secondary | ICD-10-CM | POA: Diagnosis not present

## 2020-05-01 DIAGNOSIS — E8770 Fluid overload, unspecified: Secondary | ICD-10-CM | POA: Diagnosis present

## 2020-05-01 DIAGNOSIS — Z791 Long term (current) use of non-steroidal anti-inflammatories (NSAID): Secondary | ICD-10-CM

## 2020-05-01 DIAGNOSIS — C801 Malignant (primary) neoplasm, unspecified: Secondary | ICD-10-CM | POA: Diagnosis not present

## 2020-05-01 DIAGNOSIS — E871 Hypo-osmolality and hyponatremia: Secondary | ICD-10-CM | POA: Diagnosis present

## 2020-05-01 DIAGNOSIS — E876 Hypokalemia: Secondary | ICD-10-CM

## 2020-05-01 DIAGNOSIS — Z9889 Other specified postprocedural states: Secondary | ICD-10-CM

## 2020-05-01 DIAGNOSIS — Z8249 Family history of ischemic heart disease and other diseases of the circulatory system: Secondary | ICD-10-CM

## 2020-05-01 DIAGNOSIS — K219 Gastro-esophageal reflux disease without esophagitis: Secondary | ICD-10-CM | POA: Diagnosis present

## 2020-05-01 DIAGNOSIS — M7989 Other specified soft tissue disorders: Secondary | ICD-10-CM | POA: Diagnosis present

## 2020-05-01 DIAGNOSIS — J9610 Chronic respiratory failure, unspecified whether with hypoxia or hypercapnia: Secondary | ICD-10-CM | POA: Diagnosis not present

## 2020-05-01 DIAGNOSIS — J910 Malignant pleural effusion: Secondary | ICD-10-CM | POA: Diagnosis present

## 2020-05-01 DIAGNOSIS — R279 Unspecified lack of coordination: Secondary | ICD-10-CM | POA: Diagnosis not present

## 2020-05-01 DIAGNOSIS — Z888 Allergy status to other drugs, medicaments and biological substances status: Secondary | ICD-10-CM

## 2020-05-01 DIAGNOSIS — R6881 Early satiety: Secondary | ICD-10-CM | POA: Diagnosis present

## 2020-05-01 DIAGNOSIS — R140 Abdominal distension (gaseous): Secondary | ICD-10-CM | POA: Diagnosis present

## 2020-05-01 DIAGNOSIS — R0600 Dyspnea, unspecified: Secondary | ICD-10-CM

## 2020-05-01 DIAGNOSIS — C3492 Malignant neoplasm of unspecified part of left bronchus or lung: Secondary | ICD-10-CM | POA: Diagnosis not present

## 2020-05-01 DIAGNOSIS — I493 Ventricular premature depolarization: Secondary | ICD-10-CM | POA: Diagnosis present

## 2020-05-01 DIAGNOSIS — Z79899 Other long term (current) drug therapy: Secondary | ICD-10-CM | POA: Diagnosis not present

## 2020-05-01 DIAGNOSIS — I10 Essential (primary) hypertension: Secondary | ICD-10-CM | POA: Diagnosis present

## 2020-05-01 LAB — BASIC METABOLIC PANEL
Anion gap: 13 (ref 5–15)
BUN: 15 mg/dL (ref 8–23)
CO2: 25 mmol/L (ref 22–32)
Calcium: 10.6 mg/dL — ABNORMAL HIGH (ref 8.9–10.3)
Chloride: 93 mmol/L — ABNORMAL LOW (ref 98–111)
Creatinine, Ser: 0.77 mg/dL (ref 0.44–1.00)
GFR, Estimated: >60 mL/min (ref >60)
Glucose, Bld: 125 mg/dL — ABNORMAL HIGH (ref 70–99)
Potassium: 2.9 mmol/L — ABNORMAL LOW (ref 3.5–5.1)
Sodium: 131 mmol/L — ABNORMAL LOW (ref 135–145)

## 2020-05-01 LAB — HEPATIC FUNCTION PANEL
ALT: 39 U/L (ref 0–44)
AST: 40 U/L (ref 15–41)
Albumin: 3.7 g/dL (ref 3.5–5.0)
Alkaline Phosphatase: 61 U/L (ref 38–126)
Bilirubin, Direct: 0.3 mg/dL — ABNORMAL HIGH (ref 0.0–0.2)
Indirect Bilirubin: 0.9 mg/dL (ref 0.3–0.9)
Total Bilirubin: 1.2 mg/dL (ref 0.3–1.2)
Total Protein: 6.8 g/dL (ref 6.5–8.1)

## 2020-05-01 LAB — CBC
HCT: 45.4 % (ref 36.0–46.0)
Hemoglobin: 15.8 g/dL — ABNORMAL HIGH (ref 12.0–15.0)
MCH: 30 pg (ref 26.0–34.0)
MCHC: 34.8 g/dL (ref 30.0–36.0)
MCV: 86.3 fL (ref 80.0–100.0)
Platelets: 473 10*3/uL — ABNORMAL HIGH (ref 150–400)
RBC: 5.26 MIL/uL — ABNORMAL HIGH (ref 3.87–5.11)
RDW: 14.9 % (ref 11.5–15.5)
WBC: 13.4 10*3/uL — ABNORMAL HIGH (ref 4.0–10.5)
nRBC: 0 % (ref 0.0–0.2)

## 2020-05-01 LAB — ALBUMIN, PLEURAL OR PERITONEAL FLUID: Albumin, Fluid: 2.5 g/dL

## 2020-05-01 LAB — MAGNESIUM: Magnesium: 2.2 mg/dL (ref 1.7–2.4)

## 2020-05-01 LAB — BODY FLUID CELL COUNT WITH DIFFERENTIAL
Eos, Fluid: 0 %
Lymphs, Fluid: 19 %
Monocyte-Macrophage-Serous Fluid: 78 % (ref 50–90)
Neutrophil Count, Fluid: 3 % (ref 0–25)
Total Nucleated Cell Count, Fluid: 142 cu mm (ref 0–1000)

## 2020-05-01 LAB — PROTEIN, PLEURAL OR PERITONEAL FLUID: Total protein, fluid: 4.1 g/dL

## 2020-05-01 LAB — RESP PANEL BY RT-PCR (FLU A&B, COVID) ARPGX2
Influenza A by PCR: NEGATIVE
Influenza B by PCR: NEGATIVE
SARS Coronavirus 2 by RT PCR: NEGATIVE

## 2020-05-01 LAB — LACTATE DEHYDROGENASE: LDH: 294 U/L — ABNORMAL HIGH (ref 98–192)

## 2020-05-01 LAB — LACTATE DEHYDROGENASE, PLEURAL OR PERITONEAL FLUID: LD, Fluid: 257 U/L — ABNORMAL HIGH (ref 3–23)

## 2020-05-01 LAB — GLUCOSE, PLEURAL OR PERITONEAL FLUID: Glucose, Fluid: 101 mg/dL

## 2020-05-01 LAB — BRAIN NATRIURETIC PEPTIDE: B Natriuretic Peptide: 70.3 pg/mL (ref 0.0–100.0)

## 2020-05-01 MED ORDER — SODIUM CHLORIDE 0.9 % IV SOLN
500.0000 mg | INTRAVENOUS | Status: DC
  Administered 2020-05-02: 500 mg via INTRAVENOUS
  Filled 2020-05-01 (×2): qty 500

## 2020-05-01 MED ORDER — POTASSIUM CHLORIDE CRYS ER 20 MEQ PO TBCR: 20.0000 meq | EXTENDED_RELEASE_TABLET | Freq: Once | ORAL | Status: DC

## 2020-05-01 MED ORDER — SODIUM CHLORIDE 0.9 % IV SOLN
500.0000 mg | Freq: Once | INTRAVENOUS | Status: AC
  Administered 2020-05-01: 500 mg via INTRAVENOUS
  Filled 2020-05-01: qty 500

## 2020-05-01 MED ORDER — ACETAMINOPHEN 325 MG PO TABS
650.0000 mg | ORAL_TABLET | Freq: Four times a day (QID) | ORAL | Status: DC | PRN
  Filled 2020-05-01 (×2): qty 2

## 2020-05-01 MED ORDER — LIDOCAINE-EPINEPHRINE 1 %-1:100000 IJ SOLN
10.0000 mL | Freq: Once | INTRAMUSCULAR | Status: DC
  Filled 2020-05-01: qty 1

## 2020-05-01 MED ORDER — ACETAMINOPHEN 650 MG RE SUPP: 650.0000 mg | Freq: Four times a day (QID) | RECTAL | Status: DC | PRN

## 2020-05-01 MED ORDER — ENOXAPARIN SODIUM 40 MG/0.4ML ~~LOC~~ SOLN
40.0000 mg | Freq: Every day | SUBCUTANEOUS | Status: DC
  Administered 2020-05-02 – 2020-05-03 (×2): 40 mg via SUBCUTANEOUS
  Filled 2020-05-01 (×2): qty 0.4

## 2020-05-01 MED ORDER — SODIUM CHLORIDE 0.9 % IV SOLN
1.0000 g | Freq: Once | INTRAVENOUS | Status: AC
  Administered 2020-05-01: 1 g via INTRAVENOUS
  Filled 2020-05-01: qty 10

## 2020-05-01 MED ORDER — ATORVASTATIN CALCIUM 10 MG PO TABS
20.0000 mg | ORAL_TABLET | Freq: Every day | ORAL | Status: DC
  Administered 2020-05-02 – 2020-05-11 (×11): 20 mg via ORAL
  Filled 2020-05-01 (×11): qty 2

## 2020-05-01 MED ORDER — PANTOPRAZOLE SODIUM 40 MG PO TBEC
40.0000 mg | DELAYED_RELEASE_TABLET | Freq: Two times a day (BID) | ORAL | Status: DC
  Administered 2020-05-02 – 2020-05-12 (×19): 40 mg via ORAL
  Filled 2020-05-01 (×19): qty 1

## 2020-05-01 MED ORDER — SODIUM CHLORIDE 0.9 % IV SOLN
1.0000 g | INTRAVENOUS | Status: DC
  Administered 2020-05-02: 1 g via INTRAVENOUS
  Filled 2020-05-01: qty 10
  Filled 2020-05-01: qty 1

## 2020-05-01 MED ORDER — POTASSIUM CHLORIDE CRYS ER 20 MEQ PO TBCR
40.0000 meq | EXTENDED_RELEASE_TABLET | Freq: Once | ORAL | Status: AC
  Administered 2020-05-01: 40 meq via ORAL
  Filled 2020-05-01: qty 2

## 2020-05-01 MED ORDER — METOPROLOL SUCCINATE ER 50 MG PO TB24
50.0000 mg | ORAL_TABLET | Freq: Every day | ORAL | Status: DC
Start: 2020-05-02 — End: 2020-05-12
  Administered 2020-05-02 – 2020-05-12 (×11): 50 mg via ORAL
  Filled 2020-05-01 (×9): qty 1
  Filled 2020-05-01: qty 2
  Filled 2020-05-01: qty 1

## 2020-05-01 NOTE — ED Provider Notes (Signed)
[facility] EMERGENCY DEPARTMENT Provider Note   CSN: [id number] Arrival date & time: 05/01/20  1619     History Chief Complaint  Patient presents with  . Shortness of Breath    DHANI IMEL is a 86 y.o. female.  HPI 86 year old female presents with shortness of breath for about a week.  Had an x-ray done on 12/17 that showed large left pleural effusion.  Repeat one done today.  Was supposed to get an IR thoracentesis but for what ever reason it had not been scheduled yet.  Thus she called EMS to bring her here.  Patient has been having a cough since about October.  Seems to be a little worse recently.  No fevers or chest pain.  Has chronic lower extremity edema that does not seem particularly worse since last month.  Past Medical History:  Diagnosis Date  . Bruit    Abdominal bruit - Abdominal aorta/renal duplex Doppler evaluation 12/07/03 -Mildly abnormal evaluation. 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 This is consistant w/median arcuate ligament compression syndrome. *Right & Left Kidney: Essentially equal in size, symmetrical in shape w/no significant abnormalities visualized. *Right & Left Renal Arteries: No significant abnormalities.  . Edema extremities    LE edema   . H/O myocardial perfusion scan 02/20/00   To rule out ischemia - Negative adequate Bruce protocol exercise stress test with a deconditioned exercise response and normal static myocardial perfusion images with EF calculated by QGS of 77%. Represents a low risk study.  [name] Heart murmur    history of murmur  . Hip pain   . Hyperlipidemia   . Hypertension   . Lower back pain   . Osteoarthritis   . Osteoporosis   . Valvular heart disease    Mild valvular heart disease by Echo in 2010, all mild and symptomatic, including concentric LVH, MR, TR, and AI with pulmonary artery pressure of 32. EF was normal.    Patient Active Problem List   Diagnosis Date Noted  . Pleural  effusion 05/01/2020  . PVC's (premature ventricular contractions) 02/26/2016  . Essential hypertension 12/29/2012  . Hyperlipidemia 12/29/2012  . Murmur 12/29/2012    Past Surgical History:  Procedure Laterality Date  . Abdominal aorta/Renal duplex Doppler Evaluation  12/07/03   For abdominal bruit. Mildly abnormal evaluation. (See bruit in medical history)  . BREAST EXCISIONAL BIOPSY Left 1966  . BREAST EXCISIONAL BIOPSY Left 1999  . CATARACT EXTRACTION  2003  . COLONOSCOPY  10/22/2004  . DEXA Bone Scan  06/23/2013     OB History   No obstetric history on file.     Family History  Problem Relation Age of Onset  . Leukemia Mother 89  . Cancer Father        esophagial cancer  . Heart failure Maternal Grandmother   . Tuberculosis Maternal Grandfather   . Heart disease Paternal Grandmother   . Cancer Paternal Grandfather     Social History   Tobacco Use  . Smoking status: Former Smoker    Packs/day: 0.25    Years: 6.00    Pack years: 1.50    Types: Cigarettes    Quit date: 05/13/1962    Years since quitting: 58.0  . Smokeless tobacco: Never Used  Substance Use Topics  . Alcohol use: Yes    Alcohol/week: 2.0 standard drinks    Types: 2 Standard drinks or equivalent per week    Comment: eine  . Drug use: No  Home Medications Prior to Admission medications   Medication Sig Start Date End Date Taking? Authorizing Provider  amLODipine (NORVASC) 5 MG tablet Take 5 mg by mouth daily.   Yes [provider]  atorvastatin (LIPITOR) 20 MG tablet TAKE 1 TABLET BY MOUTH EVERY DAY Patient taking differently: Take 20 mg by mouth at bedtime. 01/20/20  Yes Minus Breeding, MD  BENZONATATE PO Take 1 capsule by mouth 3 (three) times daily.   Yes [provider]  hydrochlorothiazide (MICROZIDE) 12.5 MG capsule TAKE 1 CAPSULE BY MOUTH EVERY DAY Patient taking differently: Take 12.5 mg by mouth daily. 03/09/20  Yes Hilty, Nadean Corwin, MD  metoprolol succinate  (TOPROL-XL) 50 MG 24 hr tablet TAKE 1 TABLET (50 MG TOTAL) BY MOUTH DAILY. TAKE WITH OR IMMEDIATELY FOLLOWING A MEAL. 03/09/20  Yes Hilty, Nadean Corwin, MD  alendronate (FOSAMAX) 70 MG tablet Take 70 mg by mouth every Saturday. 01/31/17   [provider]  aspirin 81 MG tablet Take 81 mg by mouth daily.    [provider]  Calcium-Vitamin D-Vitamin K (VIACTIV CALCIUM PLUS D) 650-12.5-40 MG-MCG-MCG CHEW Chew 1 tablet by mouth daily.    [provider]  Glucosamine HCl 1000 MG TABS Take 1,000 mg by mouth daily.     [provider]  Multiple Vitamin (MULTIVITAMIN) tablet Take 1 tablet by mouth daily.    [provider]  PRILOSEC OTC 20 MG tablet Take 20 mg by mouth 2 (two) times daily before a meal.    [provider]    Allergies    Irbesartan  Review of Systems   Review of Systems  Respiratory: Positive for cough and shortness of breath.   Cardiovascular: Positive for leg swelling.  All other systems reviewed and are negative.   Physical Exam Updated Vital Signs BP (!) 144/83   Pulse 99   Temp 97.8 F (36.6 C) (Oral)   Resp (!) 24   SpO2 92%   Physical Exam Vitals and nursing note reviewed.  Constitutional:      General: She is not in acute distress.    Appearance: She is well-developed and well-nourished. She is not ill-appearing.  HENT:     Head: Normocephalic and atraumatic.     Right Ear: External ear normal.     Left Ear: External ear normal.     Nose: Nose normal.  Eyes:     General:        Right eye: No discharge.        Left eye: No discharge.  Cardiovascular:     Rate and Rhythm: Normal rate and regular rhythm.     Heart sounds: Normal heart sounds.  Pulmonary:     Effort: Pulmonary effort is normal. No accessory muscle usage or respiratory distress.     Breath sounds: Examination of the left-middle field reveals decreased breath sounds. Examination of the left-lower field reveals decreased breath sounds.  Decreased breath sounds present.     Comments: Occasional cough while speaking Abdominal:     Palpations: Abdomen is soft.     Tenderness: There is no abdominal tenderness.  Musculoskeletal:     Right lower leg: Edema present.     Left lower leg: Edema present.     Comments: Significant pitting edema to BLE  Skin:    General: Skin is warm and dry.  Neurological:     Mental Status: She is alert.  Psychiatric:        Mood and Affect: Mood is not  anxious.     ED Results / Procedures / Treatments   Labs (all labs ordered are listed, but only abnormal results are displayed) Labs Reviewed  BASIC METABOLIC PANEL - Abnormal; Notable for the following components:      Result Value   Sodium 131 (*)    Potassium 2.9 (*)    Chloride 93 (*)    Glucose, Bld 125 (*)    Calcium 10.6 (*)    All other components within normal limits  CBC - Abnormal; Notable for the following components:   WBC 13.4 (*)    RBC 5.26 (*)    Hemoglobin 15.8 (*)    Platelets 473 (*)    All other components within normal limits  BODY FLUID CELL COUNT WITH DIFFERENTIAL - Abnormal; Notable for the following components:   Color, Fluid STRAW (*)    Appearance, Fluid HAZY (*)    All other components within normal limits  LACTATE DEHYDROGENASE, PLEURAL OR PERITONEAL FLUID - Abnormal; Notable for the following components:   LD, Fluid 257 (*)    All other components within normal limits  LACTATE DEHYDROGENASE - Abnormal; Notable for the following components:   LDH 294 (*)    All other components within normal limits  HEPATIC FUNCTION PANEL - Abnormal; Notable for the following components:   Bilirubin, Direct 0.3 (*)    All other components within normal limits  RESP PANEL BY RT-PCR (FLU A&B, COVID) ARPGX2  BODY FLUID CULTURE  CULTURE, BLOOD (ROUTINE X 2)  CULTURE, BLOOD (ROUTINE X 2)  MAGNESIUM  BRAIN NATRIURETIC PEPTIDE  GLUCOSE, PLEURAL OR PERITONEAL FLUID  PROTEIN, PLEURAL OR PERITONEAL FLUID  ALBUMIN,  PLEURAL OR PERITONEAL FLUID  PH, BODY FLUID  CYTOLOGY - NON PAP    EKG EKG Interpretation  Date/Time:  Monday May 01 2020 16:27:18 EST Ventricular Rate:  90 PR Interval:  146 QRS Duration: 74 QT Interval:  400 QTC Calculation: 489 R Axis:   7 Text Interpretation: Normal sinus rhythm Anterior infarct , age undetermined Abnormal ECG No old tracing to compare Confirmed by Sherwood Gambler [phone]) on 05/01/2020 5:53:45 PM   Radiology DG Chest 2 View  Result Date: 05/01/2020 CLINICAL DATA:  Shortness of breath for 5 days, diagnosed with a LEFT pleural effusion on Friday, history hypertension, former smoker EXAM: CHEST - 2 VIEW COMPARISON:  04/28/2020 FINDINGS: Subtotal opacification of the LEFT hemithorax by large LEFT pleural effusion. Significant atelectasis of LEFT lung and mediastinal shift LEFT to RIGHT. Small RIGHT pleural effusion identified. Heart size poorly seen. No pneumothorax or RIGHT lung consolidation identified. Bones demineralized. IMPRESSION: Large LEFT pleural effusion with subtotal atelectasis of LEFT lung and mediastinal shift LEFT to RIGHT little changed since 04/28/2020. Electronically Signed   By: Lavonia Dana M.D.   On: 05/01/2020 16:54   DG Chest Portable 1 View  Result Date: 05/01/2020 CLINICAL DATA:  Shortness of breath, status post thoracentesis. EXAM: PORTABLE CHEST 1 VIEW COMPARISON:  Chest x-ray 05/01/2020 FINDINGS: The heart size and mediastinal contours are not well visualized due to overlying pleural effusion and pulmonary changes. These airspace opacity of the left lung. No pulmonary edema. Interval decrease in a now small to moderate volume left pleural effusion. Persistent trace right pleural effusion. No pneumothorax. No acute osseous abnormality. IMPRESSION: 1. Interval decrease in size of a now small to moderate volume left pleural effusion. Underlying infection, inflammation, pulmonary mass not excluded. 2. Trace right pleural effusion.  Electronically Signed   By: Clelia Croft.D.  On: 05/01/2020 20:50    Procedures THORACENTESIS BEDSIDE  Date/Time: 05/01/2020 8:27 PM Performed by: Sherwood Gambler, MD Authorized by: Sherwood Gambler, MD   Consent:    Consent obtained:  Verbal and written   Consent given by:  Patient   Risks, benefits, and alternatives were discussed: yes     Risks discussed:  Bleeding, incomplete drainage, nerve damage, infection, pain and pneumothorax   Alternatives discussed:  Delayed treatment Universal protocol:    Patient identity confirmed:  Verbally with patient Sedation:    Sedation type:  None Anesthesia:    Anesthesia method:  Local infiltration   Local anesthetic:  Lidocaine 1% w/o epi Procedure details:    Preparation: Patient was prepped and draped in usual sterile fashion     Patient position:  Sitting   Location:  L midscapular line   Puncture method:  Through-the-needle catheter   Ultrasound guidance: yes     Indwelling catheter placed: yes     Number of attempts:  1   Drainage characteristics:  Serosanguinous Post-procedure details:    Chest x-ray performed: yes     Chest x-ray findings:  Pleural effusion improved   Procedure completion:  Tolerated well, no immediate complications Comments:     1545mL removed   (including critical care time)  Medications Ordered in ED Medications  cefTRIAXone (ROCEPHIN) 1 g in sodium chloride 0.9 % 100 mL IVPB (1 g Intravenous New Bag/Given 05/01/20 2313)  azithromycin (ZITHROMAX) 500 mg in sodium chloride 0.9 % 250 mL IVPB (has no administration in time range)  potassium chloride SA (KLOR-CON) CR tablet 40 mEq (40 mEq Oral Given 05/01/20 1904)    ED Course  I have reviewed the triage vital signs and the nursing notes.  Pertinent labs & imaging results that were available during my care of the patient were reviewed by me and considered in my medical decision making (see chart for details).    MDM Rules/Calculators/A&P                           After discussion of risk benefits, thoracentesis was performed as above.  She does feel improved after this.  She was ambulated in an attempt to send her home but her sats dropped to 90% and she does not feel quite well enough for discharge.  Thus, after discussion we decided to admit for observation.  We will give IV antibiotics. Final Clinical Impression(s) / ED Diagnoses Final diagnoses:  Pleural effusion, left    Rx / DC Orders ED Discharge Orders    None       Sherwood Gambler, MD 05/01/20 2333

## 2020-05-01 NOTE — ED Triage Notes (Signed)
Arrived via EMS; c/o shortness of breath. Stated she was supposed to have a fluid removal d/t pleural effusion from last XRAY done on Friday. Shortness of breath since then worsen.

## 2020-05-01 NOTE — H&P (Signed)
History and Physical    Shelley Flores:[id number] DOB: February 13, 1933 DOA: 05/01/2020  PCP: Cari Caraway, MD Patient coming from: Home  Chief Complaint: Shortness of breath  HPI: Shelley Flores is a 86 y.o. female with medical history significant of hypertension, hyperlipidemia, mild valvular heart disease presenting with complaints of shortness of breath.  Patient states she has been coughing since the end of October and was seen by her doctor who thought the cough was related to her having acid reflux so she was started on Prilosec but her cough is persisted.  For the past 10 days she has had dyspnea on exertion and fatigue.  She had an x-ray done on 12/17 which revealed a large left pleural effusion.  She was supposed to get IR thoracentesis but it has not been scheduled yet.  Since her dyspnea was worsening she came into the ED today to be evaluated.  Denies fevers and states she has been fully vaccinated against Covid.  Denies chest pain.  Denies prior history of pleural effusion/thoracentesis.  Denies history of malignancy.  She smoked cigarettes for about 4 years back in her 64s when she was in graduate school but not since then.  Denies unintentional weight loss.  Reports having abdominal distention for several months.  Also complaining of early satiety/loss of appetite.  No other complaints.  ED Course: Afebrile.  Not tachycardic.  Tachypneic with respiratory rate in the 20s.  Blood pressure elevated with systolic up to 128N.  WBC 13.4, hemoglobin 15.8, hematocrit 45.4, platelet 473K.  Sodium 131, potassium 2.9, chloride 93, bicarb 25, BUN 15, creatinine 0.7, glucose 125.  Magnesium 2.2.  BNP 70.  SARS-CoV-2 PCR test and influenza panel both negative.  Blood culture x2 pending.  Chest x-ray showing a large left pleural effusion with subtotal atelectasis of the left lung and mediastinal shift left to right, little change since 04/28/2020.  Underwent thoracentesis for large left pleural  effusion in the ED and 1500 cc fluid removed.  Repeat chest x-ray showing interval decrease in size of a now small to moderate volume left pleural effusion; underlying infection, inflammation, pulmonary mass not excluded; trace right pleural effusion.  Not hypoxic at rest but sats dropped to 90% with ambulation.  She was given ceftriaxone and azithromycin.  Also given oral potassium 40 mEq for hypokalemia.  Admission requested for observation.  Review of Systems:  All systems reviewed and apart from history of presenting illness, are negative.  Past Medical History:  Diagnosis Date  . Bruit    Abdominal bruit - Abdominal aorta/renal duplex Doppler evaluation 12/07/03 -Mildly abnormal evaluation. 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 This is consistant w/median arcuate ligament compression syndrome. *Right & Left Kidney: Essentially equal in size, symmetrical in shape w/no significant abnormalities visualized. *Right & Left Renal Arteries: No significant abnormalities.  . Edema extremities    LE edema   . H/O myocardial perfusion scan 02/20/00   To rule out ischemia - Negative adequate Bruce protocol exercise stress test with a deconditioned exercise response and normal static myocardial perfusion images with EF calculated by QGS of 77%. Represents a low risk study.  [name] Heart murmur    history of murmur  . Hip pain   . Hyperlipidemia   . Hypertension   . Lower back pain   . Osteoarthritis   . Osteoporosis   . Valvular heart disease    Mild valvular heart disease by Echo in 2010, all mild and symptomatic, including concentric LVH,  MR, TR, and AI with pulmonary artery pressure of 32. EF was normal.    Past Surgical History:  Procedure Laterality Date  . Abdominal aorta/Renal duplex Doppler Evaluation  12/07/03   For abdominal bruit. Mildly abnormal evaluation. (See bruit in medical history)  . BREAST EXCISIONAL BIOPSY Left 1966  . BREAST EXCISIONAL BIOPSY Left 1999  . CATARACT  EXTRACTION  2003  . COLONOSCOPY  10/22/2004  . DEXA Bone Scan  06/23/2013     reports that she quit smoking about 58 years ago. Her smoking use included cigarettes. She has a 1.50 pack-year smoking history. She has never used smokeless tobacco. She reports current alcohol use of about 2.0 standard drinks of alcohol per week. She reports that she does not use drugs.  Allergies  Allergen Reactions  . Irbesartan Nausea Only    Family History  Problem Relation Age of Onset  . Leukemia Mother 71  . Cancer Father        esophagial cancer  . Heart failure Maternal Grandmother   . Tuberculosis Maternal Grandfather   . Heart disease Paternal Grandmother   . Cancer Paternal Grandfather     Prior to Admission medications   Medication Sig Start Date End Date Taking? Authorizing Provider  amLODipine (NORVASC) 5 MG tablet Take 5 mg by mouth daily.   Yes [provider]  atorvastatin (LIPITOR) 20 MG tablet TAKE 1 TABLET BY MOUTH EVERY DAY Patient taking differently: Take 20 mg by mouth at bedtime. 01/20/20  Yes Minus Breeding, MD  BENZONATATE PO Take 1 capsule by mouth 3 (three) times daily.   Yes [provider]  hydrochlorothiazide (MICROZIDE) 12.5 MG capsule TAKE 1 CAPSULE BY MOUTH EVERY DAY Patient taking differently: Take 12.5 mg by mouth daily. 03/09/20  Yes Hilty, Nadean Corwin, MD  metoprolol succinate (TOPROL-XL) 50 MG 24 hr tablet TAKE 1 TABLET (50 MG TOTAL) BY MOUTH DAILY. TAKE WITH OR IMMEDIATELY FOLLOWING A MEAL. 03/09/20  Yes Hilty, Nadean Corwin, MD  alendronate (FOSAMAX) 70 MG tablet Take 70 mg by mouth every Saturday. 01/31/17   [provider]  aspirin 81 MG tablet Take 81 mg by mouth daily.    [provider]  Calcium-Vitamin D-Vitamin K (VIACTIV CALCIUM PLUS D) 650-12.5-40 MG-MCG-MCG CHEW Chew 1 tablet by mouth daily.    [provider]  Glucosamine HCl 1000 MG TABS Take 1,000 mg by mouth daily.     [provider]  Multiple Vitamin  (MULTIVITAMIN) tablet Take 1 tablet by mouth daily.    [provider]  PRILOSEC OTC 20 MG tablet Take 20 mg by mouth 2 (two) times daily before a meal.    [provider]    Physical Exam: Vitals:   05/01/20 2145 05/01/20 2200 05/01/20 2215 05/01/20 2245  BP: (!) 156/71 (!) 166/76 (!) 155/76 (!) 144/83  Pulse:    99  Resp: (!) 29 17 (!) 28 (!) 24  Temp:      TempSrc:      SpO2:    92%    Physical Exam Constitutional:      General: She is not in acute distress. HENT:     Head: Normocephalic and atraumatic.  Eyes:     Extraocular Movements: Extraocular movements intact.     Conjunctiva/sclera: Conjunctivae normal.  Cardiovascular:     Rate and Rhythm: Normal rate and regular rhythm.     Pulses: Normal pulses.  Pulmonary:     Effort: Pulmonary effort is normal.  Breath sounds: Rales present. No wheezing.     Comments: Mild bibasilar rales Abdominal:     General: Bowel sounds are normal. There is distension.     Palpations: Abdomen is soft.     Tenderness: There is no abdominal tenderness. There is no guarding or rebound.  Musculoskeletal:        General: No tenderness.     Cervical back: Normal range of motion and neck supple.     Right lower leg: Edema present.     Left lower leg: Edema present.     Comments: +4 pitting edema of bilateral lower extremities  Skin:    General: Skin is warm and dry.  Neurological:     General: No focal deficit present.     Mental Status: She is alert and oriented to person, place, and time.     Labs on Admission: I have personally reviewed following labs and imaging studies  CBC: Recent Labs  Lab 05/01/20 1632  WBC 13.4*  HGB 15.8*  HCT 45.4  MCV 86.3  PLT 621*   Basic Metabolic Panel: Recent Labs  Lab 05/01/20 1632  NA 131*  K 2.9*  CL 93*  CO2 25  GLUCOSE 125*  BUN 15  CREATININE 0.77  CALCIUM 10.6*  MG 2.2   GFR: CrCl cannot be calculated (Unknown ideal weight.). Liver Function  Tests: Recent Labs  Lab 05/01/20 1943  AST 40  ALT 39  ALKPHOS 61  BILITOT 1.2  PROT 6.8  ALBUMIN 3.7   No results for input(s): LIPASE, AMYLASE in the last 168 hours. No results for input(s): AMMONIA in the last 168 hours. Coagulation Profile: No results for input(s): INR, PROTIME in the last 168 hours. Cardiac Enzymes: No results for input(s): CKTOTAL, CKMB, CKMBINDEX, TROPONINI in the last 168 hours. BNP (last 3 results) No results for input(s): PROBNP in the last 8760 hours. HbA1C: No results for input(s): HGBA1C in the last 72 hours. CBG: No results for input(s): GLUCAP in the last 168 hours. Lipid Profile: No results for input(s): CHOL, HDL, LDLCALC, TRIG, CHOLHDL, LDLDIRECT in the last 72 hours. Thyroid Function Tests: No results for input(s): TSH, T4TOTAL, FREET4, T3FREE, THYROIDAB in the last 72 hours. Anemia Panel: No results for input(s): VITAMINB12, FOLATE, FERRITIN, TIBC, IRON, RETICCTPCT in the last 72 hours. Urine analysis: No results found for: COLORURINE, APPEARANCEUR, LABSPEC, Laura, GLUCOSEU, [place], BILIRUBINUR, KETONESUR, PROTEINUR, UROBILINOGEN, NITRITE, LEUKOCYTESUR  Radiological Exams on Admission: DG Chest 2 View  Result Date: 05/01/2020 CLINICAL DATA:  Shortness of breath for 5 days, diagnosed with a LEFT pleural effusion on Friday, history hypertension, former smoker EXAM: CHEST - 2 VIEW COMPARISON:  04/28/2020 FINDINGS: Subtotal opacification of the LEFT hemithorax by large LEFT pleural effusion. Significant atelectasis of LEFT lung and mediastinal shift LEFT to RIGHT. Small RIGHT pleural effusion identified. Heart size poorly seen. No pneumothorax or RIGHT lung consolidation identified. Bones demineralized. IMPRESSION: Large LEFT pleural effusion with subtotal atelectasis of LEFT lung and mediastinal shift LEFT to RIGHT little changed since 04/28/2020. Electronically Signed   By: Lavonia Dana M.D.   On: 05/01/2020 16:54   DG Chest Portable 1  View  Result Date: 05/01/2020 CLINICAL DATA:  Shortness of breath, status post thoracentesis. EXAM: PORTABLE CHEST 1 VIEW COMPARISON:  Chest x-ray 05/01/2020 FINDINGS: The heart size and mediastinal contours are not well visualized due to overlying pleural effusion and pulmonary changes. These airspace opacity of the left lung. No pulmonary edema. Interval decrease in a now small to  moderate volume left pleural effusion. Persistent trace right pleural effusion. No pneumothorax. No acute osseous abnormality. IMPRESSION: 1. Interval decrease in size of a now small to moderate volume left pleural effusion. Underlying infection, inflammation, pulmonary mass not excluded. 2. Trace right pleural effusion. Electronically Signed   By: Iven Finn M.D.   On: 05/01/2020 20:50    EKG: Independently reviewed.  Sinus rhythm, no significant change since prior tracing.  Assessment/Plan Principal Problem:   Pleural effusion Active Problems:   Essential hypertension   Acute hypoxemic respiratory failure (HCC)   Hyponatremia   Hypokalemia   Acute hypoxemic respiratory failure secondary to a large left pleural effusion: Patient presenting with complaints of dyspnea.  She had a chest x-ray done on 12/17 which revealed a large left pleural effusion.  She was supposed to get IR thoracentesis but it was not scheduled.  Chest x-ray done today showing  a large left pleural effusion with subtotal atelectasis of the left lung and mediastinal shift left to right, little change since 04/28/2020.  Underwent thoracentesis for large left pleural effusion in the ED and 1500 cc fluid removed.  Repeat chest x-ray showing interval decrease in size of a now small to moderate volume left pleural effusion; underlying infection, inflammation, pulmonary mass not excluded; trace right pleural effusion.  Echo done in November 2019 showing normal LVEF of 26-83%, grade 1 diastolic dysfunction, and no significant valvular disease. BNP  normal.  Based on pleural fluid analysis labs, pleural fluid/serum protein ratio 0.60 and pleural fluid/serum LDH ratio of 0.87 consistent with an exudative effusion. ?Parapneumonic effusion versus possible underlying malignancy. Pleural fluid glucose 101 and WBC count normal.  SARS-CoV-2 PCR test negative.  Mild leukocytosis on labs but no fever, tachycardia, or hypotension to suggest sepsis. Not hypoxic at rest but sats dropped to 90% with ambulation in the ED. -pH, cytology, and pleural fluid culture pending.  Continue ceftriaxone and azithromycin for coverage of bacterial pneumonia.  Will order CT chest for further evaluation.  Blood culture x2 pending.  Continue to monitor WBC count.  Check procalcitonin level.  Continuous pulse ox, supplemental oxygen as needed to keep oxygen saturation above 92%.  Ambulate in the morning and check pulse ox.  Abdominal distention: Patient reports history of abdominal distention for several months and also complaining of early satiety/loss of appetite.  No complains of abdominal pain, nausea, vomiting.  Abdomen does appear significantly distended on exam but nontender. ?Abdominal mass/ ascites. -CT ordered for further evaluation  Significant bilateral lower extremity edema: BNP normal.  She was previously on amlodipine which was stopped during cardiology visit last month.  Has +4 pitting edema of bilateral lower extremities. -IV Lasix 40 mg x 1 ordered.  Can be switched to p.o. diuretic prior to discharge.  Mild hyponatremia: Sodium 131.  Possibly due to hypervolemia or could be due to home thiazide diuretic use. -IV Lasix 40 mg x 1 ordered given significant lower extremity edema.  Check serum osmolarity and repeat BMP in a.m.  Hypokalemia: Potassium 2.9.  Likely related to home diuretic use.  Magnesium level normal. -Potassium supplementation and repeat BMP in a.m.  Hypertension: Stable.  Systolic currently in the 140s.  Amlodipine was discontinued during recent  cardiology visit due to patient having worsening lower extremity edema. -Continue metoprolol  Hyperlipidemia -Continue Lipitor  GERD -Continue Prilosec  DVT prophylaxis: Lovenox Code Status: Patient wishes to be DNR. Family Communication: No family available this time. Disposition Plan: Status is: Observation  The patient remains OBS  appropriate and will d/c before 2 midnights.  Dispo: The patient is from: Home              Anticipated d/c is to: Home              Anticipated d/c date is: 2 days              Patient currently is not medically stable to d/c.  The medical decision making on this patient was of high complexity and the patient is at high risk for clinical deterioration, therefore this is a level 3 visit.  [name] MD [facility]  If 7PM-7AM, please contact night-coverage [email or web address]  05/01/2020, 11:56 PM

## 2020-05-02 ENCOUNTER — Encounter (HOSPITAL_COMMUNITY): Payer: Self-pay | Admitting: Family Medicine

## 2020-05-02 ENCOUNTER — Observation Stay (HOSPITAL_COMMUNITY): Payer: Medicare PPO

## 2020-05-02 ENCOUNTER — Other Ambulatory Visit: Payer: Self-pay

## 2020-05-02 DIAGNOSIS — E871 Hypo-osmolality and hyponatremia: Secondary | ICD-10-CM | POA: Diagnosis present

## 2020-05-02 DIAGNOSIS — Z20822 Contact with and (suspected) exposure to covid-19: Secondary | ICD-10-CM | POA: Diagnosis present

## 2020-05-02 DIAGNOSIS — R9389 Abnormal findings on diagnostic imaging of other specified body structures: Secondary | ICD-10-CM | POA: Diagnosis present

## 2020-05-02 DIAGNOSIS — R591 Generalized enlarged lymph nodes: Secondary | ICD-10-CM | POA: Diagnosis present

## 2020-05-02 DIAGNOSIS — R140 Abdominal distension (gaseous): Secondary | ICD-10-CM | POA: Diagnosis present

## 2020-05-02 DIAGNOSIS — R0689 Other abnormalities of breathing: Secondary | ICD-10-CM | POA: Diagnosis not present

## 2020-05-02 DIAGNOSIS — J9601 Acute respiratory failure with hypoxia: Secondary | ICD-10-CM | POA: Diagnosis not present

## 2020-05-02 DIAGNOSIS — R188 Other ascites: Secondary | ICD-10-CM | POA: Diagnosis present

## 2020-05-02 DIAGNOSIS — Z66 Do not resuscitate: Secondary | ICD-10-CM | POA: Diagnosis present

## 2020-05-02 DIAGNOSIS — J910 Malignant pleural effusion: Secondary | ICD-10-CM | POA: Diagnosis present

## 2020-05-02 DIAGNOSIS — I38 Endocarditis, valve unspecified: Secondary | ICD-10-CM | POA: Diagnosis present

## 2020-05-02 DIAGNOSIS — C7800 Secondary malignant neoplasm of unspecified lung: Secondary | ICD-10-CM | POA: Diagnosis present

## 2020-05-02 DIAGNOSIS — Z87891 Personal history of nicotine dependence: Secondary | ICD-10-CM | POA: Diagnosis not present

## 2020-05-02 DIAGNOSIS — R630 Anorexia: Secondary | ICD-10-CM | POA: Diagnosis present

## 2020-05-02 DIAGNOSIS — K219 Gastro-esophageal reflux disease without esophagitis: Secondary | ICD-10-CM | POA: Diagnosis present

## 2020-05-02 DIAGNOSIS — C3490 Malignant neoplasm of unspecified part of unspecified bronchus or lung: Secondary | ICD-10-CM | POA: Diagnosis present

## 2020-05-02 DIAGNOSIS — E876 Hypokalemia: Secondary | ICD-10-CM | POA: Diagnosis present

## 2020-05-02 DIAGNOSIS — I10 Essential (primary) hypertension: Secondary | ICD-10-CM | POA: Diagnosis present

## 2020-05-02 DIAGNOSIS — C782 Secondary malignant neoplasm of pleura: Secondary | ICD-10-CM | POA: Diagnosis present

## 2020-05-02 DIAGNOSIS — K7689 Other specified diseases of liver: Secondary | ICD-10-CM | POA: Diagnosis not present

## 2020-05-02 DIAGNOSIS — Z79899 Other long term (current) drug therapy: Secondary | ICD-10-CM | POA: Diagnosis not present

## 2020-05-02 DIAGNOSIS — J90 Pleural effusion, not elsewhere classified: Secondary | ICD-10-CM | POA: Diagnosis not present

## 2020-05-02 DIAGNOSIS — E8770 Fluid overload, unspecified: Secondary | ICD-10-CM | POA: Diagnosis present

## 2020-05-02 DIAGNOSIS — I700 Atherosclerosis of aorta: Secondary | ICD-10-CM | POA: Diagnosis present

## 2020-05-02 DIAGNOSIS — R6881 Early satiety: Secondary | ICD-10-CM | POA: Diagnosis present

## 2020-05-02 DIAGNOSIS — R0602 Shortness of breath: Secondary | ICD-10-CM | POA: Diagnosis not present

## 2020-05-02 DIAGNOSIS — J9811 Atelectasis: Secondary | ICD-10-CM | POA: Diagnosis present

## 2020-05-02 DIAGNOSIS — E785 Hyperlipidemia, unspecified: Secondary | ICD-10-CM | POA: Diagnosis present

## 2020-05-02 LAB — COMPREHENSIVE METABOLIC PANEL
ALT: 29 U/L (ref 0–44)
AST: 26 U/L (ref 15–41)
Albumin: 2.8 g/dL — ABNORMAL LOW (ref 3.5–5.0)
Alkaline Phosphatase: 48 U/L (ref 38–126)
Anion gap: 12 (ref 5–15)
BUN: 11 mg/dL (ref 8–23)
CO2: 20 mmol/L — ABNORMAL LOW (ref 22–32)
Calcium: 9.2 mg/dL (ref 8.9–10.3)
Chloride: 99 mmol/L (ref 98–111)
Creatinine, Ser: 0.66 mg/dL (ref 0.44–1.00)
GFR, Estimated: >60 mL/min (ref >60)
Glucose, Bld: 82 mg/dL (ref 70–99)
Potassium: 4.1 mmol/L (ref 3.5–5.1)
Sodium: 131 mmol/L — ABNORMAL LOW (ref 135–145)
Total Bilirubin: 1 mg/dL (ref 0.3–1.2)
Total Protein: 5.1 g/dL — ABNORMAL LOW (ref 6.5–8.1)

## 2020-05-02 LAB — CBC
HCT: 43.9 % (ref 36.0–46.0)
Hemoglobin: 14.8 g/dL (ref 12.0–15.0)
MCH: 29.4 pg (ref 26.0–34.0)
MCHC: 33.7 g/dL (ref 30.0–36.0)
MCV: 87.1 fL (ref 80.0–100.0)
Platelets: 377 10*3/uL (ref 150–400)
RBC: 5.04 MIL/uL (ref 3.87–5.11)
RDW: 15 % (ref 11.5–15.5)
WBC: 14.7 10*3/uL — ABNORMAL HIGH (ref 4.0–10.5)
nRBC: 0 % (ref 0.0–0.2)

## 2020-05-02 LAB — OSMOLALITY: Osmolality: 279 mOsm/kg (ref 275–295)

## 2020-05-02 LAB — PROCALCITONIN: Procalcitonin: <0.1 ng/mL

## 2020-05-02 MED ORDER — IOHEXOL 9 MG/ML PO SOLN
500.0000 mL | ORAL | Status: AC
  Administered 2020-05-02: 500 mL via ORAL

## 2020-05-02 MED ORDER — GUAIFENESIN-DM 100-10 MG/5ML PO SYRP
5.0000 mL | ORAL_SOLUTION | ORAL | Status: DC | PRN
  Administered 2020-05-03 – 2020-05-08 (×2): 5 mL via ORAL
  Filled 2020-05-02 (×2): qty 5

## 2020-05-02 MED ORDER — IOHEXOL 9 MG/ML PO SOLN
ORAL | Status: AC
  Administered 2020-05-02: 500 mL via ORAL
  Filled 2020-05-02: qty 1000

## 2020-05-02 MED ORDER — IOHEXOL 300 MG/ML  SOLN
100.0000 mL | Freq: Once | INTRAMUSCULAR | Status: AC | PRN
  Administered 2020-05-02: 100 mL via INTRAVENOUS

## 2020-05-02 MED ORDER — FUROSEMIDE 10 MG/ML IJ SOLN
40.0000 mg | Freq: Once | INTRAMUSCULAR | Status: DC
  Filled 2020-05-02: qty 4

## 2020-05-02 MED ORDER — POTASSIUM CHLORIDE CRYS ER 20 MEQ PO TBCR
40.0000 meq | EXTENDED_RELEASE_TABLET | Freq: Once | ORAL | Status: AC
  Administered 2020-05-02: 40 meq via ORAL
  Filled 2020-05-02: qty 2

## 2020-05-02 NOTE — Progress Notes (Addendum)
PROGRESS NOTE   Shelley Flores  VOJ:[id number] DOB: [birth year] DOA: 05/01/2020 PCP: Cari Caraway, MD  Brief Narrative:  86 year old white female HTN HLD PVCs recent diagnosis of  lower extremity edema thought secondary to antihypertensives Seen recently by her doctor in October for dyspnea-found to have left large pleural effusion 12/17 Underwent thoracentesis with 1500 cc suggestive of exudate repeat x-ray showed pulmonary?  Mass not excluded started on ceftriaxone azithromycin  Assessment & Plan:   Principal Problem:   Pleural effusion Active Problems:   Essential hypertension   Acute hypoxemic respiratory failure (HCC)   Hyponatremia   Hypokalemia   1. Exudative pleural effusion a. Status post thoracentesis 1500 cc on admission 12/20 b. Does not appear needing oxygen requirement c. Fluid cultures and blood cultures 12/20 are pending-continuing ceftriaxone and azithromycin until cultures resulted and resolved d.  follow cytology in addition from thoracentesis e. Currently unclear at this time whether this is infectious or malignant related we will have to follow-up cytology and see how quickly fluid accumulates again so repeat x-ray in the morning f. Procalcitonin is negative so may be able to de-escalate antibiotics rapidly this may be a malignancy as I do not think this is related to fluid overload g. If reaccumulate quickly will need pulmonology consult in the next 24 hours h. Given Lasix X1 40 mg holding current dosing 2. Early satiety a. Etiology unclear b. Continue Protonix 40 twice daily could be related to effusion and illness 3. Hyponatremia hypokalemia a. Hypokalemia improved with replacement of 40 of K b. Hold further replacement c. A.m. labs 4. HTN a. Resume amlodipine 5 mg daily holding HCTZ 12.5 given electrolyte disturbances b. Resume metoprolol XL 50 5. HLD a. Hold statin at this time 6. PVCs a. Metoprolol as above 7. Reflux a. Continue PPI  DVT  prophylaxis: Lovenox Code Status: DNR confirmed at bedside Family Communication: None present she is coherent Disposition:  Status is: Observation  The patient will require care spanning > 2 midnights and should be moved to inpatient because: Persistent severe electrolyte disturbances, Altered mental status, Ongoing diagnostic testing needed not appropriate for outpatient work up and Unsafe d/c plan  Dispo: The patient is from: Home              Anticipated d/c is to: Home              Anticipated d/c date is: 2 days              Patient currently is not medically stable to d/c.    Consultants:   None currently  Procedures: None  Antimicrobials: Ceftriaxone azithromycin   Subjective: Awake slightly tangential pleasant doing crossword puzzle No shortness of breath when I remove her oxygen in addition her cough seems to have increased and she is bringing up more sputum now that she had the thoracentesis No chest pain Tells me some abdominal discomfort and lower extremity swelling still  Objective: Vitals:   05/02/20 0623 05/02/20 0630 05/02/20 0645 05/02/20 0700  BP:  138/63 125/62 116/81  Pulse: 74 76 77 73  Resp: 19 (!) 25 (!) 25 (!) 24  Temp:      TempSrc:      SpO2: 95% 96% 96% 96%   No intake or output data in the 24 hours ending 05/02/20 0719 There were no vitals filed for this visit.  Examination:  EOMI NCAT no focal deficit No icterus or pallor Decreased air entry left lung fields Abdomen soft no rebound  no guarding Trace lower extremity edema ROM intact power 5/5 S1-S2 no murmur no rub no gallop   Data Reviewed: I have personally reviewed following labs and imaging studies  Sodium 131 Potassium 2.9-->4.1 BUN/creatinine 11/2.66 LFTs normal WBC 13.4---> 14.7, hemoglobin 14.8  Radiology Studies: DG Chest 2 View  Result Date: 05/01/2020 CLINICAL DATA:  Shortness of breath for 5 days, diagnosed with a LEFT pleural effusion on Friday, history  hypertension, former smoker EXAM: CHEST - 2 VIEW COMPARISON:  04/28/2020 FINDINGS: Subtotal opacification of the LEFT hemithorax by large LEFT pleural effusion. Significant atelectasis of LEFT lung and mediastinal shift LEFT to RIGHT. Small RIGHT pleural effusion identified. Heart size poorly seen. No pneumothorax or RIGHT lung consolidation identified. Bones demineralized. IMPRESSION: Large LEFT pleural effusion with subtotal atelectasis of LEFT lung and mediastinal shift LEFT to RIGHT little changed since 04/28/2020. Electronically Signed   By: Lavonia Dana M.D.   On: 05/01/2020 16:54   CT CHEST ABDOMEN PELVIS W CONTRAST  Addendum Date: 05/02/2020   ADDENDUM REPORT: 05/02/2020 04:13 ADDENDUM: These results were called by telephone at the time of interpretation on 05/02/2020 at 4:13 am to provider [facility] , who verbally acknowledged these results. Electronically Signed   By: Lovena Le M.D.   On: 05/02/2020 04:13   Result Date: 05/02/2020 CLINICAL DATA:  Pleural effusion, malignancy suspected EXAM: CT CHEST, ABDOMEN, AND PELVIS WITH CONTRAST TECHNIQUE: Multidetector CT imaging of the chest, abdomen and pelvis was performed following the standard protocol during bolus administration of intravenous contrast. CONTRAST:  18mL OMNIPAQUE IOHEXOL 300 MG/ML  SOLN COMPARISON:  Radiograph 05/02/2019 FINDINGS: CT CHEST FINDINGS Cardiovascular: The aortic root is suboptimally assessed given cardiac pulsation artifact. Atherosclerotic plaque within the normal caliber aorta. No acute luminal abnormality of the imaged aorta. No periaortic stranding or hemorrhage. Normal 3 vessel branching of the aortic arch. Proximal great vessels are mildly calcified but otherwise unremarkable. Cardiac size is within normal limits. Trace pericardial effusion. Coronary artery calcifications. Central pulmonary arteries are normal caliber. No large central filling defects are present with more distal evaluation limited on this  non tailored examination of the pulmonary arteries. Mediastinum/Nodes: Scattered conspicuous mediastinal nodes including several enlarged nodes towards the cardiac apex measuring up to 10 mm short axis (3/46) additional posterior mediastinal nodes are present including a left periaortic node measuring 8 mm (3/43 and several subpleural nodules, likely small lymph nodes (3/36) subcarinal lymph node measures up to 16 mm (3/26). Lungs/Pleura: Moderate left and small right pleural effusions. The left pleural effusion demonstrates areas of conspicuous pleural thickening (3/34) and small pleural nodules (3/23). While there are adjacent areas of passive atelectatic change heterogeneous enhancement of the atelectatic lung parenchyma particularly within collapsed portions of lingula, anterior segment left upper lobe and anterior basal segment of the left lower raise concern for underlying airspace disease or malignancy. Aerated portions of the left upper and lower lobe demonstrate additional mixed ground-glass and consolidative opacity worrisome for are further airspace disease. Relative sparing in the aerated portions of the right lung. Musculoskeletal: There is asymmetric soft tissue edema across the left chest wall. No clear extension of the pleural or airspace processes in to the chest wall proper. No acute or worrisome osseous lesions. Heterogeneously dense breast tissue without focal breast nodularity or masses. Few benign macro calcifications. Multilevel degenerative changes in the spine and shoulders. Dextrocurvature of the thoracic spine. CT ABDOMEN PELVIS FINDINGS Hepatobiliary: Few scattered subcentimeter hypoattenuating foci are present in the liver, largest in the left lobe  measuring up to 8 mm (3/51). Too small to fully characterize on CT imaging. No other focal concerning liver lesions. Gallbladder is unremarkable. No pericholecystic fluid or inflammation. No visible intraductal gallstones or biliary  dilatation. Pancreas: No pancreatic ductal dilatation or surrounding inflammatory changes. Spleen: Few punctate hypoattenuating foci in the spleen are too small to characterize. No worrisome focal splenic lesions. Normal splenic size. Adrenals/Urinary Tract: No concerning adrenal nodules or masses. Multiple fluid attenuation cysts are present in both kidneys, largest are present in the upper pole left kidney measuring up to 7.4 cm with some thin peripheral mural calcification (Bosniak 2). No concerning focal renal lesions. No urolithiasis or hydronephrosis. Urinary bladder is largely decompressed at the time of exam and therefore poorly evaluated by CT imaging. No gross bladder abnormality. Stomach/Bowel: Mild thickening at the gastric antrum is likely related to normal peristaltic motion. Duodenum is unremarkable. High attenuation enteric contrast material partially traverses the colon. No small bowel thickening or dilatation. No proximal colonic abnormalities are identified. Some questionable thickening of the rectosigmoid though may be related to underdistention. However some perirectal fat stranding is present which is nonspecific. No evidence of obstruction. Appendix is not visualized. No focal inflammation the vicinity of the cecum to suggest an occult appendicitis. Vascular/Lymphatic: Atherosclerotic calcifications within the abdominal aorta and branch vessels. No aneurysm or ectasia. No enlarged abdominopelvic lymph nodes. Reproductive: Patient appears to be post hysterectomy. No concerning adnexal lesion. Other: Mild presacral fat stranding and trace fluid in the pelvis, possibly reactive fluid status or reactive change. No free air. Mild body wall edema most pronounced over the left flank and hip. No bowel containing hernias. Musculoskeletal: Multilevel degenerative changes are present in the imaged portions of the spine. Stepwise retrolisthesis L1-L4. Levocurvature of the lumbar spine. No spondylolysis.  Additional degenerative changes in the hips and pelvis. No acute osseous abnormality or suspicious osseous lesion. Sclerotic focus in the right ilium (6/53) is likely bone island. IMPRESSION: 1. Moderate left and small right pleural effusions. The left pleural effusion demonstrates areas of conspicuous pleural thickening and small pleural nodularity. While this could reflect empyema, a malignant effusion with pleural deposits would present similarly. Furthermore, there is heterogeneous enhancement of the adjacent atelectatic lung parenchyma including within the lingula, anterior segment left upper lobe and anterior basal segment of the left lower lobe. Could reflect underlying airspace disease or malignancy with additional airspace disease seen throughout aerated portions of the left lung. 2. Asymmetric soft tissue edema across the left chest wall. No clear extension of the pleural or airspace processes in to the chest wall proper. Possibly reactive though should with exam findings and continued attention on follow-up imaging is recommended. 3. Some questionable thickening of the rectosigmoid though may be related to underdistention though some mild perirectal fat stranding is present however and could suggest an underlying proctitis. 4. Aortic Atherosclerosis (ICD10-I70.0). Currently attempting to contact the ordering provider with a critical value result. Addendum will be submitted upon case discussion. Electronically Signed: By: Lovena Le M.D. On: 05/02/2020 04:04   DG Chest Portable 1 View  Result Date: 05/01/2020 CLINICAL DATA:  Shortness of breath, status post thoracentesis. EXAM: PORTABLE CHEST 1 VIEW COMPARISON:  Chest x-ray 05/01/2020 FINDINGS: The heart size and mediastinal contours are not well visualized due to overlying pleural effusion and pulmonary changes. These airspace opacity of the left lung. No pulmonary edema. Interval decrease in a now small to moderate volume left pleural effusion.  Persistent trace right pleural effusion. No pneumothorax. No  acute osseous abnormality. IMPRESSION: 1. Interval decrease in size of a now small to moderate volume left pleural effusion. Underlying infection, inflammation, pulmonary mass not excluded. 2. Trace right pleural effusion. Electronically Signed   By: Iven Finn M.D.   On: 05/01/2020 20:50     Scheduled Meds: . atorvastatin  20 mg Oral QHS  . enoxaparin (LOVENOX) injection  40 mg Subcutaneous Daily  . furosemide  40 mg Intravenous Once  . metoprolol succinate  50 mg Oral Daily  . pantoprazole  40 mg Oral BID AC   Continuous Infusions: . azithromycin    . cefTRIAXone (ROCEPHIN)  IV       LOS: 0 days    Time spent: Twin, MD [facility] To contact the attending provider between 7A-7P or the covering provider during after hours 7P-7A, please log into the web site [email or web address] and access using universal [place] password for that web site. If you do not have the password, please call the hospital operator.  05/02/2020, 7:19 AM

## 2020-05-02 NOTE — ED Notes (Signed)
pts O2 sats remain >92% while awake, during rest, sats drop to high 80s. Pt placed on 2L O2 via n.c.

## 2020-05-02 NOTE — ED Notes (Signed)
Lunch Tray Ordered @ [id number].

## 2020-05-02 NOTE — ED Notes (Signed)
Pt ambulatory to and from restroom with steady gait 

## 2020-05-03 ENCOUNTER — Encounter (HOSPITAL_COMMUNITY): Payer: Self-pay | Admitting: Family Medicine

## 2020-05-03 ENCOUNTER — Inpatient Hospital Stay (HOSPITAL_COMMUNITY): Payer: Medicare PPO

## 2020-05-03 DIAGNOSIS — J90 Pleural effusion, not elsewhere classified: Secondary | ICD-10-CM | POA: Diagnosis not present

## 2020-05-03 DIAGNOSIS — J9601 Acute respiratory failure with hypoxia: Secondary | ICD-10-CM | POA: Diagnosis not present

## 2020-05-03 DIAGNOSIS — E871 Hypo-osmolality and hyponatremia: Secondary | ICD-10-CM

## 2020-05-03 DIAGNOSIS — E876 Hypokalemia: Secondary | ICD-10-CM

## 2020-05-03 DIAGNOSIS — I10 Essential (primary) hypertension: Secondary | ICD-10-CM

## 2020-05-03 LAB — COMPREHENSIVE METABOLIC PANEL
ALT: 23 U/L (ref 0–44)
AST: 22 U/L (ref 15–41)
Albumin: 2.4 g/dL — ABNORMAL LOW (ref 3.5–5.0)
Alkaline Phosphatase: 43 U/L (ref 38–126)
Anion gap: 8 (ref 5–15)
BUN: 13 mg/dL (ref 8–23)
CO2: 22 mmol/L (ref 22–32)
Calcium: 9.2 mg/dL (ref 8.9–10.3)
Chloride: 102 mmol/L (ref 98–111)
Creatinine, Ser: 0.7 mg/dL (ref 0.44–1.00)
GFR, Estimated: >60 mL/min (ref >60)
Glucose, Bld: 95 mg/dL (ref 70–99)
Potassium: 3.7 mmol/L (ref 3.5–5.1)
Sodium: 132 mmol/L — ABNORMAL LOW (ref 135–145)
Total Bilirubin: 0.9 mg/dL (ref 0.3–1.2)
Total Protein: 4.7 g/dL — ABNORMAL LOW (ref 6.5–8.1)

## 2020-05-03 LAB — CBC WITH DIFFERENTIAL/PLATELET
Abs Immature Granulocytes: 0.05 10*3/uL (ref 0.00–0.07)
Basophils Absolute: 0.1 10*3/uL (ref 0.0–0.1)
Basophils Relative: 1 %
Eosinophils Absolute: 0.1 10*3/uL (ref 0.0–0.5)
Eosinophils Relative: 1 %
HCT: 38.3 % (ref 36.0–46.0)
Hemoglobin: 13.7 g/dL (ref 12.0–15.0)
Immature Granulocytes: 0 %
Lymphocytes Relative: 14 %
Lymphs Abs: 1.6 10*3/uL (ref 0.7–4.0)
MCH: 30.8 pg (ref 26.0–34.0)
MCHC: 35.8 g/dL (ref 30.0–36.0)
MCV: 86.1 fL (ref 80.0–100.0)
Monocytes Absolute: 1.1 10*3/uL — ABNORMAL HIGH (ref 0.1–1.0)
Monocytes Relative: 10 %
Neutro Abs: 8.5 10*3/uL — ABNORMAL HIGH (ref 1.7–7.7)
Neutrophils Relative %: 74 %
Platelets: 340 10*3/uL (ref 150–400)
RBC: 4.45 MIL/uL (ref 3.87–5.11)
RDW: 15.1 % (ref 11.5–15.5)
WBC: 11.5 10*3/uL — ABNORMAL HIGH (ref 4.0–10.5)
nRBC: 0 % (ref 0.0–0.2)

## 2020-05-03 LAB — PATHOLOGIST SMEAR REVIEW

## 2020-05-03 LAB — PH, BODY FLUID: pH, Body Fluid: 7.6

## 2020-05-03 MED ORDER — AZITHROMYCIN 250 MG PO TABS: 500.0000 mg | ORAL_TABLET | Freq: Every day | ORAL | Status: DC

## 2020-05-03 NOTE — Consult Note (Signed)
NAME:  Shelley Flores, MRN:  [id number], DOB:  April 05, 1933, LOS: 1 ADMISSION DATE:  05/01/2020, CONSULTATION DATE:  05/03/20 REFERRING MD:  Alfredia Ferguson - [facility], CHIEF COMPLAINT:  SOB, pleural effusion  Brief History:  86 yo F admitted for SOB, got thora 12/20 of L effusion in ED. PCCM consulted 12/22 because is still present on CT 12/21  History of Present Illness:  86 yo F PMH HTN HLD who presented to ED 12/20 with CC cough and SOB. Cough began late October, and was initially thought to be related to reflux, started on Prilosec. Cough persisted, and DOE progressively onset 12/10 to day of ED presentation.  She had an Xray done 12/17 and had planned to get IR thora. However since had not been scheduled, presented to ED. Found to have large L pleural effusion, underwent thoracentesis in the ED on 12/20, with 1540ml fluid removed & exudative by LDH, and was admitted to [facility]. Underwent CT chest 12/21 which revealed presence of residual left pleural effusion. PCCM consulted 12/22 in this setting.   Past Medical History:  HTN HLD Osteoarthritis Osteoarthritis Low back pain  Significant Hospital Events:  12/20 thora in ED for large L effusion, 1500 ml off 12/21 CT chest with remnant moderate L effusion 12/22 PCCM consult for possible thoracentesis   Consults:  PCCM  Procedures:  12/20 L thoracentesis: LDH ratio 0.87 protein ratio 0.60. Glu 101 normal WBC   -Pleural fluid glucose 101 and WBC   Significant Diagnostic Tests:  12/21 C T c/a/p> several enlarged medistinal lymph nodes, several subpleural nodules, subcarinal lymph node 25mm. Bilateral pleural effusion L>R. L pleural thickening. LUL LLL opacities, GGO.  Assymetric soft tissue edema across L chest.    Micro Data:  COVID negative  Antimicrobials:  12/21 Azithromycin> 12/21 Rocephin>   Interim History / Subjective:  With walker, ambulating in room on RA.  States she is upset about her CXR findings, with presence of L pleural  effusion.   Patient asking why these pleural effusions are happening  Objective   Blood pressure 127/63, pulse 85, temperature (!) 97.4 F (36.3 C), temperature source Oral, resp. rate 20, height 5\' 4"  (1.626 m), weight 64.7 kg, SpO2 93 %.        Intake/Output Summary (Last 24 hours) at 05/03/2020 1409 Last data filed at 05/03/2020 0500 Gross per 24 hour  Intake 590 ml  Output --  Net 590 ml   Filed Weights   05/03/20 0512  Weight: 64.7 kg    Examination: General: Chronically ill appearing elderly F, seated on bed NAD  HENT: NCAT, trachea midline  Lungs: L sided crackles, with diminished L basilar sound. R sight clear. Symmetrical chest expansion, even and unlabored  Cardiovascular: rrr cap refill < 3 seconds 2+ radial pulses  Abdomen: soft round nontender Extremities: BLE edema, symmetrical muscle bulk and tone  Neuro: AAOx4 following commands no focal deficits  Psych: appropriate insight for age and situation  GU: defer   Resolved Hospital Problem list    Acute hypoxic respiratory failure   Assessment & Plan:   Left pleural effusion -s/p thoracentesis 12/20 with 1500 ml removed, Exudative by lights criteria -- LDH 257 (ratio 0.87) -CT 12/21 with residual L effusion + pleural thickening, small pleural nodularity (?possible malignant effusion vs parapneumonic?) Small right pleural effusion  Left sided GGO, possible ASD P -Patient tentatively scheduled for left sided thoracentesis 05/04/20 at 1400, in endoscopy -AM CXR -pulm hygiene, IS -on azithromycin and rocephin  Lymphadenopathy -mediastinal  nodes (including 72mm node toward cardiac apex), subcarinal node -with presence of effusion above, ? Malignancy? Could also be r/t non malignant etiologies like infection. P - cont abx  - will d/w PCCM MD if biopsy to be considered?    Hyponatremia, mild - Osm being checked  Hypokalemia - replaced  HTN - on metop HLD - atorvastatin GERD- PPI   Best practice  (evaluated daily)  Diet: reg Pain/Anxiety/Delirium protocol (if indicated): APAP VAP protocol (if indicated): -- DVT prophylaxis: lovenox  GI prophylaxis: protonix Glucose control: -- Mobility: ambulates self with walker  Disposition:med tele   Goals of Care:  Last date of multidisciplinary goals of care discussion:-- Family and staff present: -- Summary of discussion: -- Follow up goals of care discussion due: -- Code Status: DNR  Labs   CBC: Recent Labs  Lab 05/01/20 1632 05/02/20 0704 05/03/20 0213  WBC 13.4* 14.7* 11.5*  NEUTROABS  --   --  8.5*  HGB 15.8* 14.8 13.7  HCT 45.4 43.9 38.3  MCV 86.3 87.1 86.1  PLT 473* 377 505    Basic Metabolic Panel: Recent Labs  Lab 05/01/20 1632 05/02/20 0704 05/03/20 0213  NA 131* 131* 132*  K 2.9* 4.1 3.7  CL 93* 99 102  CO2 25 20* 22  GLUCOSE 125* 82 95  BUN 15 11 13   CREATININE 0.77 0.66 0.70  CALCIUM 10.6* 9.2 9.2  MG 2.2  --   --    GFR: Estimated Creatinine Clearance: 44.4 mL/min (by C-G formula based on SCr of 0.7 mg/dL). Recent Labs  Lab 05/01/20 1632 05/02/20 0704 05/03/20 0213  PROCALCITON  --  <0.10  --   WBC 13.4* 14.7* 11.5*    Liver Function Tests: Recent Labs  Lab 05/01/20 1943 05/02/20 0704 05/03/20 0213  AST 40 26 22  ALT 39 29 23  ALKPHOS 61 48 43  BILITOT 1.2 1.0 0.9  PROT 6.8 5.1* 4.7*  ALBUMIN 3.7 2.8* 2.4*   No results for input(s): LIPASE, AMYLASE in the last 168 hours. No results for input(s): AMMONIA in the last 168 hours.  ABG No results found for: PHART, PCO2ART, PO2ART, HCO3, TCO2, ACIDBASEDEF, O2SAT   Coagulation Profile: No results for input(s): INR, PROTIME in the last 168 hours.  Cardiac Enzymes: No results for input(s): CKTOTAL, CKMB, CKMBINDEX, TROPONINI in the last 168 hours.  HbA1C: No results found for: HGBA1C  CBG: No results for input(s): GLUCAP in the last 168 hours.  Review of Systems:    Positives include:  +cough + SOB  + DOE + BLE  edema +diarrhea   Full ROS complete, all else negative  Past Medical History:  She,  has a past medical history of Bruit, Edema extremities, H/O myocardial perfusion scan (02/20/00), Heart murmur, Hip pain, Hyperlipidemia, Hypertension, Lower back pain, Osteoarthritis, Osteoporosis, Pleural effusion (04/2020), and Valvular heart disease.   Surgical History:   Past Surgical History:  Procedure Laterality Date  . Abdominal aorta/Renal duplex Doppler Evaluation  12/07/03   For abdominal bruit. Mildly abnormal evaluation. (See bruit in medical history)  . BREAST EXCISIONAL BIOPSY Left 1966  . BREAST EXCISIONAL BIOPSY Left 1999  . CATARACT EXTRACTION  2003  . COLONOSCOPY  10/22/2004  . DEXA Bone Scan  06/23/2013     Social History:   reports that she quit smoking about 58 years ago. Her smoking use included cigarettes. She has a 1.50 pack-year smoking history. She has never used smokeless tobacco. She reports current alcohol use of about 2.0  standard drinks of alcohol per week. She reports that she does not use drugs.   Family History:  Her family history includes Cancer in her father and paternal grandfather; Heart disease in her paternal grandmother; Heart failure in her maternal grandmother; Leukemia (age of onset: 84) in her mother; Tuberculosis in her maternal grandfather.   Allergies Allergies  Allergen Reactions  . Irbesartan Nausea Only     Home Medications  Prior to Admission medications   Medication Sig Start Date End Date Taking? Authorizing Provider  amLODipine (NORVASC) 5 MG tablet Take 5 mg by mouth daily.   Yes [provider]  atorvastatin (LIPITOR) 20 MG tablet TAKE 1 TABLET BY MOUTH EVERY DAY Patient taking differently: Take 20 mg by mouth at bedtime. 01/20/20  Yes Minus Breeding, MD  BENZONATATE PO Take 1 capsule by mouth 3 (three) times daily.   Yes [provider]  hydrochlorothiazide (MICROZIDE) 12.5 MG capsule TAKE 1 CAPSULE BY MOUTH EVERY  DAY Patient taking differently: Take 12.5 mg by mouth daily. 03/09/20  Yes Hilty, Nadean Corwin, MD  metoprolol succinate (TOPROL-XL) 50 MG 24 hr tablet TAKE 1 TABLET (50 MG TOTAL) BY MOUTH DAILY. TAKE WITH OR IMMEDIATELY FOLLOWING A MEAL. 03/09/20  Yes Hilty, Nadean Corwin, MD  alendronate (FOSAMAX) 70 MG tablet Take 70 mg by mouth every Saturday. 01/31/17   [provider]  aspirin 81 MG tablet Take 81 mg by mouth daily.    [provider]  Calcium-Vitamin D-Vitamin K (VIACTIV CALCIUM PLUS D) 650-12.5-40 MG-MCG-MCG CHEW Chew 1 tablet by mouth daily.    [provider]  Glucosamine HCl 1000 MG TABS Take 1,000 mg by mouth daily.     [provider]  Multiple Vitamin (MULTIVITAMIN) tablet Take 1 tablet by mouth daily.    [provider]  PRILOSEC OTC 20 MG tablet Take 20 mg by mouth 2 (two) times daily before a meal.    [provider]    Eliseo Gum MSN, AGACNP-BC [place] [phone] If no answer, [phone] 05/03/2020, 3:43 PM

## 2020-05-03 NOTE — Progress Notes (Signed)
PROGRESS NOTE    Shelley Flores  Shelley Flores DOB: [birth year] DOA: 05/01/2020 PCP: Cari Caraway, MD   Brief Narrative:  HPI per Dr. [name] on 05/01/20  HPI: Shelley Flores is a 86 y.o. female with medical history significant of hypertension, hyperlipidemia, mild valvular heart disease presenting with complaints of shortness of breath.  Patient states she has been coughing since the end of October and was seen by her doctor who thought the cough was related to her having acid reflux so she was started on Prilosec but her cough is persisted.  For the past 10 days she has had dyspnea on exertion and fatigue.  She had an x-ray done on 12/17 which revealed a large left pleural effusion.  She was supposed to get IR thoracentesis but it has not been scheduled yet.  Since her dyspnea was worsening she came into the ED today to be evaluated.  Denies fevers and states she has been fully vaccinated against Covid.  Denies chest pain.  Denies prior history of pleural effusion/thoracentesis.  Denies history of malignancy.  She smoked cigarettes for about 4 years back in her 6s when she was in graduate school but not since then.  Denies unintentional weight loss.  Reports having abdominal distention for several months.  Also complaining of early satiety/loss of appetite.  No other complaints.  ED Course: Afebrile.  Not tachycardic.  Tachypneic with respiratory rate in the 20s.  Blood pressure elevated with systolic up to 563O.  WBC 13.4, hemoglobin 15.8, hematocrit 45.4, platelet 473K.  Sodium 131, potassium 2.9, chloride 93, bicarb 25, BUN 15, creatinine 0.7, glucose 125.  Magnesium 2.2.  BNP 70.  SARS-CoV-2 PCR test and influenza panel both negative.  Blood culture x2 pending.  Chest x-ray showing a large left pleural effusion with subtotal atelectasis of the left lung and mediastinal shift left to right, little change since 04/28/2020.  Underwent thoracentesis for large left pleural effusion  in the ED and 1500 cc fluid removed.  Repeat chest x-ray showing interval decrease in size of a now small to moderate volume left pleural effusion; underlying infection, inflammation, pulmonary mass not excluded; trace right pleural effusion.  Not hypoxic at rest but sats dropped to 90% with ambulation.  She was given ceftriaxone and azithromycin.  Also given oral potassium 40 mEq for hypokalemia.  Admission requested for observation.  **Interim History  Patient underwent a left-sided thoracentesis and drained 1.5 L of suggestive of an exudative effusion.  She was recently seen in October by her doctor for dyspnea and found to have a pleural effusion on admission.  CT scan done as well.  She is started on IV ceftriaxone and azithromycin and pulmonary is consulted for further evaluation recommendations given her very large pleural effusion. Will consider a repeat Thoracentesis.   Assessment & Plan:   Principal Problem:   Pleural effusion Active Problems:   Essential hypertension   Acute hypoxemic respiratory failure (HCC)   Hyponatremia   Hypokalemia  Acute Respiratory Failure with Hypoxia secondary to a large left pleural effusion:  -Patient presenting with complaints of dyspnea.   -She had a chest x-ray done on 12/17 which revealed a large left pleural effusion.  -She was supposed to get IR thoracentesis but it was not scheduled as an outpatient.   -Chest x-ray done today showing  a large left pleural effusion with subtotal atelectasis of the left lung and mediastinal shift left to right, little change since 04/28/2020.   -Underwent thoracentesis for  large left pleural effusion in the ED and 1500 cc fluid removed.   -Repeat chest x-ray 12/21/ showing interval decrease in size of a now small to moderate volume left pleural effusion; underlying infection, inflammation, pulmonary mass not excluded; trace right pleural effusion.   -Echo done in November 2019 showing normal LVEF of 65-70%, grade 1  diastolic dysfunction, and no significant valvular disease. BNP normal.   -Based on pleural fluid analysis labs, pleural fluid/serum protein ratio 0.60 and pleural fluid/serum LDH ratio of 0.87 consistent with an exudative effusion.  -?Parapneumonic effusion versus possible underlying malignancy.  -Pleural fluid glucose 101 and WBC count normal.  SARS-CoV-2 PCR test negative.   -On admission she had a Mild leukocytosis on labs but no fever, tachycardia, or hypotension to suggest sepsis. Not hypoxic at rest but sats dropped to 90% with ambulation in the ED. -pH was 7,6, cytology pending, and pleural fluid culture pending.   -Continue ceftriaxone and azithromycin for coverage of bacterial pneumonia. Change to po Azithro  -CT Scan ordered for further evaluation and showed "Moderate left and small right pleural effusions. The left pleural effusion demonstrates areas of conspicuous pleural thickening and small pleural nodularity. While this could reflect empyema, a malignant effusion with pleural deposits would present similarly. Furthermore, there is heterogeneous enhancement of the adjacent atelectatic lung parenchyma including within the lingula, anterior segment left upper lobe and anterior basal segment of the left lower lobe. Could reflect underlying airspace disease or malignancy with additional airspace disease seen throughout aerated portions of the left lung. Asymmetric soft tissue edema across the left chest wall. No clear extension of the pleural or airspace processes in to the chest wall proper. Possibly reactive though should with exam findings and continued attention on follow-up imaging is recommended. Some questionable thickening of the rectosigmoid though may be related to underdistention though some mild perirectal fat stranding is present however and could suggest an underlying proctitis. Aortic Atherosclerosis."  -Blood culture x2 Showed NGTD <24 Hours.  Continue to monitor WBC count as it is  trending down and went from 13.4 -> 14.7 -> 11.5 .   -Check procalcitonin level and was <0.10. Likely can start to de-escalate Abx and change Azithromucin to po -SpO2: 93 %; Now off of Supplemental O2 via [place] -Continue supplemental oxygen via nasal cannula and wean O2 as tolerated -Continuous pulse Oximetry and Maintain oxygen saturation above 92%.   -Repeat CXR this AM showed "Large left, small right pleural effusions with associated atelectasis or consolidation are not significantly changed compared to prior examination. No new airspace opacity." -Wanted to order the patient some IV Lasix and was given a dose of IV 40 this morning but patient declined -Will consult Pulmonary for further evaluation and Recommendations   Abdominal Distention and Early Satiety -Patient reports history of abdominal distention for several months and also complaining of early satiety/loss of appetite.   -No complains of abdominal pain, nausea, vomiting.  Abdomen does appear significantly distended on exam but nontender. ?Abdominal mass/ ascites. -CT ordered for further evaluation as above -C/w PPI Pantoprazole 40 mg po Daily  Significant bilateral lower extremity edema -BNP normal.  She was previously on amlodipine which was stopped during cardiology visit last month. -Has +2-3+ pitting edema of bilateral lower extremities. -IV Lasix 40 mg x 1 ordered on admission and patient refused; ordered IV Lasix again and she refused again.   -Could be in the setting of poor Nutritional Status and Hypoalbuminemia  -Will obtain LE Venous Duplex to  r/o DVT  Hyponatremia -Mild -Sodium 131 on admission and is now 132.   -Possibly due to hypervolemia or could be due to home thiazide diuretic use. -IV Lasix 40 mg x 1 ordered given significant lower extremity edema but not given and ordered again today and she refused.   -Check serum osmolarity and repeat CMP in the AM   Hypokalemia -Potassium 2.9 on admission and  improved to 3.7 this AM after repleteion.   -Likely related to home diuretic use.  Magnesium level normal at 2.2 on Admission. -Continue to Monitor and Replete as Necessary -Repeat CMP in the AM   Hypertension -Stable.  -Amlodipine was discontinued during recent cardiology visit due to patient having worsening lower extremity edema and will continue to Hold -Continue Metoprolol Succinate 50 mg po daily -Continue to Monitor BP per Protocol -Repeat CMP in the AM   Hyperlipidemia -C/w Atorvastatin 20 mg po daily   PVCs -C/w telemetry Monitoring -C/w Metoprolol Succinate 50 mg po Daily  GERD -Continue PPI with Prilosec Substitution of po Pantoprazole 40 mg po BID daily   DVT prophylaxis: Enoxaparin 40 mg sq q24h Code Status: DO NOT RESUSCITATE Family Communication: No family present at bedside  Disposition Plan: Pending further improvement and workup and evaluation and clearance by Pulmonary  Status is: Inpatient  Remains inpatient appropriate because:Unsafe d/c plan, IV treatments appropriate due to intensity of illness or inability to take PO and Inpatient level of care appropriate due to severity of illness   Dispo: The patient is from: Home              Anticipated d/c is to: TBD              Anticipated d/c date is: 1-2 days              Patient currently is not medically stable to d/c.  Consultants:   Interventional Radiology  Pulmonary/Critical Care  Procedures: Thoracentesis 04/01/20 removed 1500 mL  Antimicrobials:  Anti-infectives (From admission, onward)   Start     Dose/Rate Route Frequency Ordered Stop   05/02/20 2200  azithromycin (ZITHROMAX) 500 mg in sodium chloride 0.9 % 250 mL IVPB        500 mg 250 mL/hr over 60 Minutes Intravenous Every 24 hours 05/01/20 2354     05/02/20 2200  cefTRIAXone (ROCEPHIN) 1 g in sodium chloride 0.9 % 100 mL IVPB        1 g 200 mL/hr over 30 Minutes Intravenous Every 24 hours 05/01/20 2354     05/01/20 2300   cefTRIAXone (ROCEPHIN) 1 g in sodium chloride 0.9 % 100 mL IVPB        1 g 200 mL/hr over 30 Minutes Intravenous  Once 05/01/20 2255 05/01/20 2347   05/01/20 2300  azithromycin (ZITHROMAX) 500 mg in sodium chloride 0.9 % 250 mL IVPB        500 mg 250 mL/hr over 60 Minutes Intravenous  Once 05/01/20 2255 05/02/20 0054        Subjective: Seen and examined at bedside and still a little dyspneic and still have nonproductive dry hacking cough.  States that she feels "tired" and could be better.  No chest pain.  Denies any lightheadedness or dizziness now but she has some yesterday.  Continues to have lower extremity swelling but refuses Lasix.  Objective: Vitals:   05/02/20 1353 05/02/20 1353 05/02/20 2318 05/03/20 0512  BP: 138/78 138/78 127/63   Pulse: 87 92 85   Resp: [name])  24 (!) 24 20   Temp: 98.9 F (37.2 C) 98.9 F (37.2 C) (!) 97.4 F (36.3 C)   TempSrc:   Oral   SpO2:  96% 93%   Weight:    64.7 kg  Height:    5\' 4"  (1.626 m)    Intake/Output Summary (Last 24 hours) at 05/03/2020 0758 Last data filed at 05/03/2020 0500 Gross per 24 hour  Intake 590 ml  Output --  Net 590 ml   Filed Weights   05/03/20 0512  Weight: 64.7 kg   Examination: Physical Exam:  Constitutional: WN/WD elderly Caucasian female who appears calm but slightly uncomfortable  Eyes: Lids and conjunctivae normal, sclerae anicteric  ENMT: External Ears, Nose appear normal. Grossly normal hearing. Mucous membranes are moist.  Neck: Appears normal, supple, no cervical masses, normal ROM, no appreciable thyromegaly; mild JVD Respiratory: Diminished to auscultation bilaterally with coarse breath sounds worse on the left compared to right and some crackles and some slight rhonchi. Normal respiratory effort and patient is not tachypenic. No accessory muscle use.  Not wearing any supplemental oxygen via nasal Cardiovascular: RRR, no murmurs / rubs / gallops. S1 and S2 auscultated.  2+ lower extremity pitting  edema Abdomen: Soft, non-tender, non-distended.  Bowel sounds positive.  GU: Deferred. Musculoskeletal: No clubbing / cyanosis of digits/nails. No joint deformity upper and lower extremities.  Skin: No rashes, lesions, ulcers on limited skin evaluation. No induration; Warm and dry.  Neurologic: CN 2-12 grossly intact with no focal deficits. Romberg sign and cerebellar reflexes not assessed.  Psychiatric: Normal judgment and insight. Alert and oriented x 3. Normal mood and appropriate affect.   Data Reviewed: I have personally reviewed following labs and imaging studies  CBC: Recent Labs  Lab 05/01/20 1632 05/02/20 0704 05/03/20 0213  WBC 13.4* 14.7* 11.5*  NEUTROABS  --   --  8.5*  HGB 15.8* 14.8 13.7  HCT 45.4 43.9 38.3  MCV 86.3 87.1 86.1  PLT 473* 377 425   Basic Metabolic Panel: Recent Labs  Lab 05/01/20 1632 05/02/20 0704 05/03/20 0213  NA 131* 131* 132*  K 2.9* 4.1 3.7  CL 93* 99 102  CO2 25 20* 22  GLUCOSE 125* 82 95  BUN 15 11 13   CREATININE 0.77 0.66 0.70  CALCIUM 10.6* 9.2 9.2  MG 2.2  --   --    GFR: Estimated Creatinine Clearance: 44.4 mL/min (by C-G formula based on SCr of 0.7 mg/dL). Liver Function Tests: Recent Labs  Lab 05/01/20 1943 05/02/20 0704 05/03/20 0213  AST 40 26 22  ALT 39 29 23  ALKPHOS 61 48 43  BILITOT 1.2 1.0 0.9  PROT 6.8 5.1* 4.7*  ALBUMIN 3.7 2.8* 2.4*   No results for input(s): LIPASE, AMYLASE in the last 168 hours. No results for input(s): AMMONIA in the last 168 hours. Coagulation Profile: No results for input(s): INR, PROTIME in the last 168 hours. Cardiac Enzymes: No results for input(s): CKTOTAL, CKMB, CKMBINDEX, TROPONINI in the last 168 hours. BNP (last 3 results) No results for input(s): PROBNP in the last 8760 hours. HbA1C: No results for input(s): HGBA1C in the last 72 hours. CBG: No results for input(s): GLUCAP in the last 168 hours. Lipid Profile: No results for input(s): CHOL, HDL, LDLCALC, TRIG,  CHOLHDL, LDLDIRECT in the last 72 hours. Thyroid Function Tests: No results for input(s): TSH, T4TOTAL, FREET4, T3FREE, THYROIDAB in the last 72 hours. Anemia Panel: No results for input(s): VITAMINB12, FOLATE, FERRITIN, TIBC, IRON, RETICCTPCT in  the last 72 hours. Sepsis Labs: Recent Labs  Lab 05/02/20 0704  PROCALCITON <0.10    Recent Results (from the past 240 hour(s))  Resp Panel by RT-PCR (Flu A&B, Covid) Nasopharyngeal Swab     Status: None   Collection Time: 05/01/20  6:22 PM   Specimen: Nasopharyngeal Swab; Nasopharyngeal(NP) swabs in vial transport medium  Result Value Ref Range Status   SARS Coronavirus 2 by RT PCR NEGATIVE NEGATIVE Final    Comment: (NOTE) SARS-CoV-2 target nucleic acids are NOT DETECTED.  The SARS-CoV-2 RNA is generally detectable in upper respiratory specimens during the acute phase of infection. The lowest concentration of SARS-CoV-2 viral copies this assay can detect is 138 copies/mL. A negative result does not preclude SARS-Cov-2 infection and should not be used as the sole basis for treatment or other patient management decisions. A negative result may occur with  improper specimen collection/handling, submission of specimen other than nasopharyngeal swab, presence of viral mutation(s) within the areas targeted by this assay, and inadequate number of viral copies(<138 copies/mL). A negative result must be combined with clinical observations, patient history, and epidemiological information. The expected result is Negative.  Fact Sheet for Patients:  EntrepreneurPulse.com.au  Fact Sheet for Healthcare Providers:  IncredibleEmployment.be  This test is no t yet approved or cleared by the [place] FDA and  has been authorized for detection and/or diagnosis of SARS-CoV-2 by FDA under an Emergency Use Authorization (EUA). This EUA will remain  in effect (meaning this test can be used) for the duration of  the COVID-19 declaration under Section 564(b)(1) of the Act, 21 U.S.C.section 360bbb-3(b)(1), unless the authorization is terminated  or revoked sooner.       Influenza A by PCR NEGATIVE NEGATIVE Final   Influenza B by PCR NEGATIVE NEGATIVE Final    Comment: (NOTE) The Xpert Xpress SARS-CoV-2/FLU/RSV plus assay is intended as an aid in the diagnosis of influenza from Nasopharyngeal swab specimens and should not be used as a sole basis for treatment. Nasal washings and aspirates are unacceptable for Xpert Xpress SARS-CoV-2/FLU/RSV testing.  Fact Sheet for Patients: EntrepreneurPulse.com.au  Fact Sheet for Healthcare Providers: IncredibleEmployment.be  This test is not yet approved or cleared by the [place] FDA and has been authorized for detection and/or diagnosis of SARS-CoV-2 by FDA under an Emergency Use Authorization (EUA). This EUA will remain in effect (meaning this test can be used) for the duration of the COVID-19 declaration under Section 564(b)(1) of the Act, 21 U.S.C. section 360bbb-3(b)(1), unless the authorization is terminated or revoked.  Performed at [facility], [place] [address], [place], [place] [address]   Body fluid culture (includes gram stain)     Status: None (Preliminary result)   Collection Time: 05/01/20  8:38 PM   Specimen: Pleural Fluid  Result Value Ref Range Status   Specimen Description FLUID PLEURAL  Final   Special Requests NONE  Final   Gram Stain   Final    RARE WBC PRESENT, PREDOMINANTLY MONONUCLEAR NO ORGANISMS SEEN    Culture   Final    NO GROWTH < 12 HOURS Performed at [facility], [place] [address] [place],  [address]    Report Status PENDING  Incomplete  Culture, blood (routine x 2)     Status: None (Preliminary result)   Collection Time: 05/01/20 11:05 PM   Specimen: BLOOD  Result Value Ref Range Status   Specimen Description BLOOD SITE NOT SPECIFIED  Final   Special  Requests  Final    BOTTLES DRAWN AEROBIC AND ANAEROBIC Blood Culture adequate volume   Culture   Final    NO GROWTH < 12 HOURS Performed at [facility], [place] [address] [place], [place] [address]    Report Status PENDING  Incomplete     RN Pressure Injury Documentation:     Estimated body mass index is 24.48 kg/m as calculated from the following:   Height as of this encounter: 5\' 4"  (1.626 m).   Weight as of this encounter: 64.7 kg.  Malnutrition Type:      Malnutrition Characteristics:      Nutrition Interventions:        Radiology Studies: DG Chest 2 View  Result Date: 05/01/2020 CLINICAL DATA:  Shortness of breath for 5 days, diagnosed with a LEFT pleural effusion on Friday, history hypertension, former smoker EXAM: CHEST - 2 VIEW COMPARISON:  04/28/2020 FINDINGS: Subtotal opacification of the LEFT hemithorax by large LEFT pleural effusion. Significant atelectasis of LEFT lung and mediastinal shift LEFT to RIGHT. Small RIGHT pleural effusion identified. Heart size poorly seen. No pneumothorax or RIGHT lung consolidation identified. Bones demineralized. IMPRESSION: Large LEFT pleural effusion with subtotal atelectasis of LEFT lung and mediastinal shift LEFT to RIGHT little changed since 04/28/2020. Electronically Signed   By: Lavonia Dana M.D.   On: 05/01/2020 16:54   CT CHEST ABDOMEN PELVIS W CONTRAST  Addendum Date: 05/02/2020   ADDENDUM REPORT: 05/02/2020 04:13 ADDENDUM: These results were called by telephone at the time of interpretation on 05/02/2020 at 4:13 am to provider [facility] , who verbally acknowledged these results. Electronically Signed   By: Lovena Le M.D.   On: 05/02/2020 04:13   Result Date: 05/02/2020 CLINICAL DATA:  Pleural effusion, malignancy suspected EXAM: CT CHEST, ABDOMEN, AND PELVIS WITH CONTRAST TECHNIQUE: Multidetector CT imaging of the chest, abdomen and pelvis was performed following the standard protocol during bolus  administration of intravenous contrast. CONTRAST:  146mL OMNIPAQUE IOHEXOL 300 MG/ML  SOLN COMPARISON:  Radiograph 05/02/2019 FINDINGS: CT CHEST FINDINGS Cardiovascular: The aortic root is suboptimally assessed given cardiac pulsation artifact. Atherosclerotic plaque within the normal caliber aorta. No acute luminal abnormality of the imaged aorta. No periaortic stranding or hemorrhage. Normal 3 vessel branching of the aortic arch. Proximal great vessels are mildly calcified but otherwise unremarkable. Cardiac size is within normal limits. Trace pericardial effusion. Coronary artery calcifications. Central pulmonary arteries are normal caliber. No large central filling defects are present with more distal evaluation limited on this non tailored examination of the pulmonary arteries. Mediastinum/Nodes: Scattered conspicuous mediastinal nodes including several enlarged nodes towards the cardiac apex measuring up to 10 mm short axis (3/46) additional posterior mediastinal nodes are present including a left periaortic node measuring 8 mm (3/43 and several subpleural nodules, likely small lymph nodes (3/36) subcarinal lymph node measures up to 16 mm (3/26). Lungs/Pleura: Moderate left and small right pleural effusions. The left pleural effusion demonstrates areas of conspicuous pleural thickening (3/34) and small pleural nodules (3/23). While there are adjacent areas of passive atelectatic change heterogeneous enhancement of the atelectatic lung parenchyma particularly within collapsed portions of lingula, anterior segment left upper lobe and anterior basal segment of the left lower raise concern for underlying airspace disease or malignancy. Aerated portions of the left upper and lower lobe demonstrate additional mixed ground-glass and consolidative opacity worrisome for are further airspace disease. Relative sparing in the aerated portions of the right lung. Musculoskeletal: There is asymmetric soft tissue edema across  the  left chest wall. No clear extension of the pleural or airspace processes in to the chest wall proper. No acute or worrisome osseous lesions. Heterogeneously dense breast tissue without focal breast nodularity or masses. Few benign macro calcifications. Multilevel degenerative changes in the spine and shoulders. Dextrocurvature of the thoracic spine. CT ABDOMEN PELVIS FINDINGS Hepatobiliary: Few scattered subcentimeter hypoattenuating foci are present in the liver, largest in the left lobe measuring up to 8 mm (3/51). Too small to fully characterize on CT imaging. No other focal concerning liver lesions. Gallbladder is unremarkable. No pericholecystic fluid or inflammation. No visible intraductal gallstones or biliary dilatation. Pancreas: No pancreatic ductal dilatation or surrounding inflammatory changes. Spleen: Few punctate hypoattenuating foci in the spleen are too small to characterize. No worrisome focal splenic lesions. Normal splenic size. Adrenals/Urinary Tract: No concerning adrenal nodules or masses. Multiple fluid attenuation cysts are present in both kidneys, largest are present in the upper pole left kidney measuring up to 7.4 cm with some thin peripheral mural calcification (Bosniak 2). No concerning focal renal lesions. No urolithiasis or hydronephrosis. Urinary bladder is largely decompressed at the time of exam and therefore poorly evaluated by CT imaging. No gross bladder abnormality. Stomach/Bowel: Mild thickening at the gastric antrum is likely related to normal peristaltic motion. Duodenum is unremarkable. High attenuation enteric contrast material partially traverses the colon. No small bowel thickening or dilatation. No proximal colonic abnormalities are identified. Some questionable thickening of the rectosigmoid though may be related to underdistention. However some perirectal fat stranding is present which is nonspecific. No evidence of obstruction. Appendix is not visualized. No focal  inflammation the vicinity of the cecum to suggest an occult appendicitis. Vascular/Lymphatic: Atherosclerotic calcifications within the abdominal aorta and branch vessels. No aneurysm or ectasia. No enlarged abdominopelvic lymph nodes. Reproductive: Patient appears to be post hysterectomy. No concerning adnexal lesion. Other: Mild presacral fat stranding and trace fluid in the pelvis, possibly reactive fluid status or reactive change. No free air. Mild body wall edema most pronounced over the left flank and hip. No bowel containing hernias. Musculoskeletal: Multilevel degenerative changes are present in the imaged portions of the spine. Stepwise retrolisthesis L1-L4. Levocurvature of the lumbar spine. No spondylolysis. Additional degenerative changes in the hips and pelvis. No acute osseous abnormality or suspicious osseous lesion. Sclerotic focus in the right ilium (6/53) is likely bone island. IMPRESSION: 1. Moderate left and small right pleural effusions. The left pleural effusion demonstrates areas of conspicuous pleural thickening and small pleural nodularity. While this could reflect empyema, a malignant effusion with pleural deposits would present similarly. Furthermore, there is heterogeneous enhancement of the adjacent atelectatic lung parenchyma including within the lingula, anterior segment left upper lobe and anterior basal segment of the left lower lobe. Could reflect underlying airspace disease or malignancy with additional airspace disease seen throughout aerated portions of the left lung. 2. Asymmetric soft tissue edema across the left chest wall. No clear extension of the pleural or airspace processes in to the chest wall proper. Possibly reactive though should with exam findings and continued attention on follow-up imaging is recommended. 3. Some questionable thickening of the rectosigmoid though may be related to underdistention though some mild perirectal fat stranding is present however and  could suggest an underlying proctitis. 4. Aortic Atherosclerosis (ICD10-I70.0). Currently attempting to contact the ordering provider with a critical value result. Addendum will be submitted upon case discussion. Electronically Signed: By: Lovena Le M.D. On: 05/02/2020 04:04   DG Chest Portable 1 View  Result  Date: 05/01/2020 CLINICAL DATA:  Shortness of breath, status post thoracentesis. EXAM: PORTABLE CHEST 1 VIEW COMPARISON:  Chest x-ray 05/01/2020 FINDINGS: The heart size and mediastinal contours are not well visualized due to overlying pleural effusion and pulmonary changes. These airspace opacity of the left lung. No pulmonary edema. Interval decrease in a now small to moderate volume left pleural effusion. Persistent trace right pleural effusion. No pneumothorax. No acute osseous abnormality. IMPRESSION: 1. Interval decrease in size of a now small to moderate volume left pleural effusion. Underlying infection, inflammation, pulmonary mass not excluded. 2. Trace right pleural effusion. Electronically Signed   By: Iven Finn M.D.   On: 05/01/2020 20:50   Scheduled Meds: . atorvastatin  20 mg Oral QHS  . enoxaparin (LOVENOX) injection  40 mg Subcutaneous Daily  . furosemide  40 mg Intravenous Once  . metoprolol succinate  50 mg Oral Daily  . pantoprazole  40 mg Oral BID AC   Continuous Infusions: . azithromycin 500 mg (05/02/20 2235)  . cefTRIAXone (ROCEPHIN)  IV 1 g (05/02/20 2146)    LOS: 1 day   Kerney Elbe, DO [facility] PAGER is on [place]  If 7PM-7AM, please contact night-coverage [email or web address]

## 2020-05-04 ENCOUNTER — Other Ambulatory Visit: Payer: Self-pay

## 2020-05-04 ENCOUNTER — Telehealth: Payer: Self-pay | Admitting: Internal Medicine

## 2020-05-04 ENCOUNTER — Inpatient Hospital Stay (HOSPITAL_COMMUNITY): Payer: Medicare PPO

## 2020-05-04 ENCOUNTER — Encounter (HOSPITAL_COMMUNITY)
Admission: EM | Disposition: A | Discharge status: Skilled Nursing Facility | Payer: Self-pay | Source: Home / Self Care | Attending: Internal Medicine

## 2020-05-04 DIAGNOSIS — I10 Essential (primary) hypertension: Secondary | ICD-10-CM | POA: Diagnosis not present

## 2020-05-04 DIAGNOSIS — R0602 Shortness of breath: Secondary | ICD-10-CM

## 2020-05-04 DIAGNOSIS — J90 Pleural effusion, not elsewhere classified: Secondary | ICD-10-CM | POA: Diagnosis not present

## 2020-05-04 DIAGNOSIS — R0689 Other abnormalities of breathing: Secondary | ICD-10-CM

## 2020-05-04 DIAGNOSIS — E876 Hypokalemia: Secondary | ICD-10-CM | POA: Diagnosis not present

## 2020-05-04 DIAGNOSIS — J9601 Acute respiratory failure with hypoxia: Secondary | ICD-10-CM | POA: Diagnosis not present

## 2020-05-04 HISTORY — PX: IR THORACENTESIS ASP PLEURAL SPACE W/IMG GUIDE: IMG5380

## 2020-05-04 LAB — BODY FLUID CELL COUNT WITH DIFFERENTIAL
Eos, Fluid: 1 %
Lymphs, Fluid: 60 %
Monocyte-Macrophage-Serous Fluid: 22 % — ABNORMAL LOW (ref 50–90)
Neutrophil Count, Fluid: 17 % (ref 0–25)
Total Nucleated Cell Count, Fluid: 574 cu mm (ref 0–1000)

## 2020-05-04 LAB — CBC WITH DIFFERENTIAL/PLATELET
Abs Immature Granulocytes: 0.1 10*3/uL — ABNORMAL HIGH (ref 0.00–0.07)
Basophils Absolute: 0.1 10*3/uL (ref 0.0–0.1)
Basophils Relative: 0 %
Eosinophils Absolute: 0.1 10*3/uL (ref 0.0–0.5)
Eosinophils Relative: 1 %
HCT: 40 % (ref 36.0–46.0)
Hemoglobin: 13.7 g/dL (ref 12.0–15.0)
Immature Granulocytes: 1 %
Lymphocytes Relative: 13 %
Lymphs Abs: 1.9 10*3/uL (ref 0.7–4.0)
MCH: 29.6 pg (ref 26.0–34.0)
MCHC: 34.3 g/dL (ref 30.0–36.0)
MCV: 86.4 fL (ref 80.0–100.0)
Monocytes Absolute: 1.5 10*3/uL — ABNORMAL HIGH (ref 0.1–1.0)
Monocytes Relative: 11 %
Neutro Abs: 10.3 10*3/uL — ABNORMAL HIGH (ref 1.7–7.7)
Neutrophils Relative %: 74 %
Platelets: 346 10*3/uL (ref 150–400)
RBC: 4.63 MIL/uL (ref 3.87–5.11)
RDW: 15 % (ref 11.5–15.5)
WBC: 13.9 10*3/uL — ABNORMAL HIGH (ref 4.0–10.5)
nRBC: 0 % (ref 0.0–0.2)

## 2020-05-04 LAB — GLUCOSE, PLEURAL OR PERITONEAL FLUID: Glucose, Fluid: 110 mg/dL

## 2020-05-04 LAB — COMPREHENSIVE METABOLIC PANEL
ALT: 25 U/L (ref 0–44)
AST: 22 U/L (ref 15–41)
Albumin: 2.5 g/dL — ABNORMAL LOW (ref 3.5–5.0)
Alkaline Phosphatase: 46 U/L (ref 38–126)
Anion gap: 7 (ref 5–15)
BUN: 11 mg/dL (ref 8–23)
CO2: 23 mmol/L (ref 22–32)
Calcium: 9.3 mg/dL (ref 8.9–10.3)
Chloride: 104 mmol/L (ref 98–111)
Creatinine, Ser: 0.79 mg/dL (ref 0.44–1.00)
GFR, Estimated: >60 mL/min (ref >60)
Glucose, Bld: 108 mg/dL — ABNORMAL HIGH (ref 70–99)
Potassium: 3.7 mmol/L (ref 3.5–5.1)
Sodium: 134 mmol/L — ABNORMAL LOW (ref 135–145)
Total Bilirubin: 0.7 mg/dL (ref 0.3–1.2)
Total Protein: 5 g/dL — ABNORMAL LOW (ref 6.5–8.1)

## 2020-05-04 LAB — ALBUMIN, PLEURAL OR PERITONEAL FLUID: Albumin, Fluid: 1.7 g/dL

## 2020-05-04 LAB — ECHOCARDIOGRAM COMPLETE
Area-P 1/2: 3.6 cm2
Height: 64 in
S' Lateral: 1.6 cm
Weight: 2282.2 oz

## 2020-05-04 LAB — AMYLASE, PLEURAL OR PERITONEAL FLUID: Amylase, Fluid: 74 U/L

## 2020-05-04 LAB — LACTATE DEHYDROGENASE, PLEURAL OR PERITONEAL FLUID: LD, Fluid: 503 U/L — ABNORMAL HIGH (ref 3–23)

## 2020-05-04 LAB — MAGNESIUM: Magnesium: 2.1 mg/dL (ref 1.7–2.4)

## 2020-05-04 LAB — PROTEIN, PLEURAL OR PERITONEAL FLUID: Total protein, fluid: <3 g/dL

## 2020-05-04 LAB — PHOSPHORUS: Phosphorus: 2 mg/dL — ABNORMAL LOW (ref 2.5–4.6)

## 2020-05-04 LAB — CYTOLOGY - NON PAP

## 2020-05-04 SURGERY — THORACENTESIS
Anesthesia: LOCAL

## 2020-05-04 MED ORDER — LIDOCAINE HCL 1 % IJ SOLN
INTRAMUSCULAR | Status: AC
  Filled 2020-05-04: qty 20

## 2020-05-04 MED ORDER — IOHEXOL 300 MG/ML  SOLN
75.0000 mL | Freq: Once | INTRAMUSCULAR | Status: AC | PRN
  Administered 2020-05-04: 75 mL via INTRAVENOUS

## 2020-05-04 MED ORDER — K PHOS MONO-SOD PHOS DI & MONO 155-852-130 MG PO TABS
500.0000 mg | ORAL_TABLET | Freq: Two times a day (BID) | ORAL | Status: AC
  Administered 2020-05-04 (×2): 500 mg via ORAL
  Filled 2020-05-04 (×2): qty 2

## 2020-05-04 MED ORDER — FUROSEMIDE 10 MG/ML IJ SOLN
40.0000 mg | Freq: Once | INTRAMUSCULAR | Status: AC
  Administered 2020-05-05: 40 mg via INTRAVENOUS
  Filled 2020-05-04 (×2): qty 4

## 2020-05-04 MED ORDER — IOHEXOL 300 MG/ML  SOLN: 75.0000 mL | Freq: Once | INTRAMUSCULAR | Status: DC | PRN

## 2020-05-04 MED ORDER — LIDOCAINE HCL 1 % IJ SOLN
INTRAMUSCULAR | Status: AC | PRN
  Administered 2020-05-04: 10 mL

## 2020-05-04 NOTE — CV Procedure (Signed)
BLE venous completed.  Results can be found under chart review under CV PROC. 05/04/2020 4:37 PM Koen Antilla RVT, RDMS

## 2020-05-04 NOTE — Progress Notes (Signed)
IR received order for Pleurx for malignant left pleural effusion. Pt just underwent large volume thoracentesis a couple hours ago and was tapped dry. Will have to wait several days to allow effusion to recur before safe to place PleurX. Earliest would be Monday 12/27. IR will follow along and plan accordingly.   Ascencion Dike PA-C Interventional Radiology 05/04/2020 4:29 PM

## 2020-05-04 NOTE — Progress Notes (Signed)
    Results of thora -> malignant pleural effusion  D/w Dr Alfredia Ferguson of [facility] - he has called oncology Dr Benay Spice  - h eis going to break news to patient  - he plans to call [facility] for pleurx v VATS pleurodesis  CCM will sign off but will ensure opd followup with Dr Beryl Meager    Dr. Brand Males, M.D., F.C.C.P,  Pulmonary and Critical Care Medicine Staff Physician, [place] Director - Interstitial Lung Disease  Program  Pulmonary [place] [facility] [place], [place], [address]  Pager: [phone], If no answer  OR between  19:00-7:00h: page [phone] Telephone (clinical office): [phone] Telephone (research): [phone]  4:15 PM 05/04/2020

## 2020-05-04 NOTE — Progress Notes (Addendum)
PROGRESS NOTE    Shelley Flores  KDT:[id number] DOB: 09-26-1932 DOA: 05/01/2020 PCP: Cari Caraway, MD   Brief Narrative:  HPI per Dr. [name] on 05/01/20  HPI: Shelley Flores is a 86 y.o. female with medical history significant of hypertension, hyperlipidemia, mild valvular heart disease presenting with complaints of shortness of breath.  Patient states she has been coughing since the end of October and was seen by her doctor who thought the cough was related to her having acid reflux so she was started on Prilosec but her cough is persisted.  For the past 10 days she has had dyspnea on exertion and fatigue.  She had an x-ray done on 12/17 which revealed a large left pleural effusion.  She was supposed to get IR thoracentesis but it has not been scheduled yet.  Since her dyspnea was worsening she came into the ED today to be evaluated.  Denies fevers and states she has been fully vaccinated against Covid.  Denies chest pain.  Denies prior history of pleural effusion/thoracentesis.  Denies history of malignancy.  She smoked cigarettes for about 4 years back in her 2s when she was in graduate school but not since then.  Denies unintentional weight loss.  Reports having abdominal distention for several months.  Also complaining of early satiety/loss of appetite.  No other complaints.  ED Course: Afebrile.  Not tachycardic.  Tachypneic with respiratory rate in the 20s.  Blood pressure elevated with systolic up to 998P.  WBC 13.4, hemoglobin 15.8, hematocrit 45.4, platelet 473K.  Sodium 131, potassium 2.9, chloride 93, bicarb 25, BUN 15, creatinine 0.7, glucose 125.  Magnesium 2.2.  BNP 70.  SARS-CoV-2 PCR test and influenza panel both negative.  Blood culture x2 pending.  Chest x-ray showing a large left pleural effusion with subtotal atelectasis of the left lung and mediastinal shift left to right, little change since 04/28/2020.  Underwent thoracentesis for large left pleural effusion  in the ED and 1500 cc fluid removed.  Repeat chest x-ray showing interval decrease in size of a now small to moderate volume left pleural effusion; underlying infection, inflammation, pulmonary mass not excluded; trace right pleural effusion.  Not hypoxic at rest but sats dropped to 90% with ambulation.  She was given ceftriaxone and azithromycin.  Also given oral potassium 40 mEq for hypokalemia.  Admission requested for observation.  **Interim History  Patient underwent a left-sided thoracentesis and drained 1.5 L of suggestive of an exudative effusion.  She was recently seen in October by her doctor for dyspnea and found to have a pleural effusion on admission.  CT scan done as well.  She is started on IV ceftriaxone and azithromycin and pulmonary is consulted for further evaluation recommendations given her very large pleural effusion. She is undergoing a repeat Thoracentesis today.   Assessment & Plan:   Principal Problem:   Pleural effusion Active Problems:   Essential hypertension   Acute hypoxemic respiratory failure (HCC)   Hyponatremia   Hypokalemia  Acute Respiratory Failure with Hypoxia secondary to a Large Left Pleural Effusion  -Patient presenting with complaints of dyspnea.   -She had a chest x-ray done on 12/17 which revealed a large left pleural effusion.  -She was supposed to get IR thoracentesis but it was not scheduled as an outpatient.   -Chest x-ray done on admission and was showing a large left pleural effusion with subtotal atelectasis of the left lung and mediastinal shift left to right, little change since 04/28/2020.   -  Underwent thoracentesis for large left pleural effusion in the ED and 1500 cc fluid removed.   -Repeat chest x-ray 05/02/20 showing interval decrease in size of a now small to moderate volume left pleural effusion; underlying infection, inflammation, pulmonary mass not excluded; trace right pleural effusion.   -Echo done in November 2019 showing normal  LVEF of 88-41%, grade 1 diastolic dysfunction, and no significant valvular disease. BNP normal.   -Based on pleural fluid analysis labs, pleural fluid/serum protein ratio 0.60 and pleural fluid/serum LDH ratio of 0.87 consistent with an exudative effusion.  -?Parapneumonic effusion versus possible underlying malignancy.  -Pleural fluid glucose 101 and WBC count normal.  SARS-CoV-2 PCR test negative.   -On admission she had a Mild leukocytosis on labs but no fever, tachycardia, or hypotension to suggest sepsis. Not hypoxic at rest but sats dropped to 90% with ambulation in the ED. 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 Tresa Moore in Pathology), and pleural fluid culture showed NGTD at 3 Days. -Continue ceftriaxone and azithromycin for coverage of bacterial pneumonia. Change to po Azithro  -CT Scan ordered for further evaluation and showed "Moderate left and small right pleural effusions. The left pleural effusion demonstrates areas of conspicuous pleural thickening and small pleural nodularity. While this could reflect empyema, a malignant effusion with pleural deposits would present similarly. Furthermore, there is heterogeneous enhancement of the adjacent atelectatic lung parenchyma including within the lingula, anterior segment left upper lobe and anterior basal segment of the left lower lobe. Could reflect underlying airspace disease or malignancy with additional airspace disease seen throughout aerated portions of the left lung. Asymmetric soft tissue edema across the left chest wall. No clear extension of the pleural or airspace processes in to the chest wall proper. Possibly reactive though should with exam findings and continued attention on follow-up imaging is recommended. Some questionable thickening of the rectosigmoid though may be related to underdistention though some mild perirectal fat stranding  is present however and could suggest an underlying proctitis. Aortic Atherosclerosis."  -Blood culture x2 Showed NGTD <24 Hours.  Continue to monitor WBC count as it is fluctuating and went from 13.4 -> 14.7 -> 11.5 -> 13.9 -Check procalcitonin level and was <0.10. Abx now stopped -SpO2: 93 %; Now off of Supplemental O2 via [place] -Continue supplemental oxygen via nasal cannula and wean O2 as tolerated -Continuous pulse Oximetry and Maintain oxygen saturation above 92%.   -Repeat CXR this AM showed "Large left, small right pleural effusions with associated atelectasis or consolidation are not significantly changed compared to prior examination. No new airspace opacity." -Patient agreeable to getting IV Lasix now and scheduled for this afternoon at 5 PM -ECHO done as below and showing EF of 60-65% and G1DD -Will consult Pulmonary for further evaluation and Recommendations and they are recommending a thoracentesis today.  Thoracentesis was done and she had 1.55 Liters removed again  -Repeat CXR showed "Interval decreased size of left pleural effusion following thoracentesis with improved aeration of the left lung. No pneumothorax. Persistent left perihilar opacity may reflect a mass or infiltrate." -Pulmonary to weigh in further and have recommended repeating a CT Scan of the Chest with Contrast and recommended calling [facility] to evaluate for VATS Pleurodesis vs Pleur-X Catheter Placement ; Will obtain LE Venous Duplex -I spoke with Dr. Kipp Brood of [facility] and he recommends IR to place a Pleur-X Catheter -Repeat CXR in the AM and follow up CT  Scan of Chest -I also spoke with Dr. Benay Spice of Oncology who recommends outpatient follow up and patient will need Molecular Testing and additional workup including PET Scan  Abdominal Distention and Early Satiety -Patient reports history of abdominal distention for several months and also complaining of early satiety/loss of appetite.   -No complains of abdominal  pain, nausea, vomiting.  Abdomen does appear significantly distended on exam but nontender. ?Abdominal mass/ ascites. -CT ordered for further evaluation as above -C/w PPI Pantoprazole 40 mg po Daily -Evaluate for Mass  Bilateral Lower Extremity Edema -BNP normal.  She was previously on amlodipine which was stopped during cardiology visit last month. -Has 1-2+ mildly pitting edema of bilateral lower extremities but mostly non-pitting. -IV Lasix 40 mg x 1 ordered on admission and patient refused; ordered IV Lasix again and she refused again.  -ECHOCardiogram showed Grade 1 DD and normal EF of 60-65% -Could be in the setting of poor Nutritional Status and Hypoalbuminemia  -Will obtain LE Venous Duplex to r/o DVT  Hyponatremia -Mild -Sodium 131 on admission and is now 132 -> 134   -Possibly due to hypervolemia or could be due to home thiazide diuretic use. -IV Lasix 40 mg x 1 ordered and patient agreeable to taking it today  -Check serum osmolarity and repeat CMP in the AM   Hypokalemia -Potassium 2.9 on admission and improved to 3.7; 3.7 again this AM -Likely related to home diuretic use.  Magnesium level normal at 2.2 on Admission. -Continue to Monitor and Replete as Necessary -Repeat CMP in the AM   Hypertension -Stable.  -Amlodipine was discontinued during recent cardiology visit due to patient having worsening lower extremity edema and will continue to Hold -Continue Metoprolol Succinate 50 mg po daily -Continue to Monitor BP per Protocol -Last BP reading was 174/83  Hyperlipidemia -C/w Atorvastatin 20 mg po daily   PVCs -C/w telemetry Monitoring -C/w Metoprolol Succinate 50 mg po Daily  Hypophosphatemia -Patient's Phos Level was 2.0 -Replete with po KPhos Neutral 500 mg x2 -Continue to Monitor and Replete as Necessary -Repeat Phos Level in the AM    Left Arm IV Infiltration r/o Phlebitis  -Warm Compresses -Area was erythematous and slightly boggy -If persists  will get an U/S of the ARM  -Elevation of the Extremity   GERD -Continue PPI with Prilosec Substitution of po Pantoprazole 40 mg po BID daily   DVT prophylaxis: Enoxaparin 40 mg sq q24h Code Status: DO NOT RESUSCITATE Family Communication: No family present at bedside  Disposition Plan: Pending further improvement and workup and evaluation and clearance by Pulmonary  Status is: Inpatient  Remains inpatient appropriate because:Unsafe d/c plan, IV treatments appropriate due to intensity of illness or inability to take PO and Inpatient level of care appropriate due to severity of illness   Dispo: The patient is from: Home              Anticipated d/c is to: TBD              Anticipated d/c date is: 1-2 days              Patient currently is not medically stable to d/c.  Consultants:   Interventional Radiology  Pulmonary/Critical Care  Discussed with Dr. Melodie Bouillon of [facility]  Discussed with Dr. Benay Spice of Medical Oncology   Procedures:  Thoracentesis 04/01/20 removed 1500 mL   Thoracentesis 04/04/20 removed 1.55 Liters of Hazy Amber Fluid   ECHOCARDIOGRAM IMPRESSIONS    1. Left ventricular  ejection fraction, by estimation, is 60 to 65%. The  left ventricle has normal function. The left ventricle has no regional  wall motion abnormalities. Left ventricular diastolic parameters are  consistent with Grade I diastolic  dysfunction (impaired relaxation). Elevated left ventricular end-diastolic  pressure.  2. Right ventricular systolic function is normal. The right ventricular  size is normal.  3. The mitral valve is degenerative. No evidence of mitral valve  regurgitation. No evidence of mitral stenosis. Moderate mitral annular  calcification.  4. The aortic valve is normal in structure. Aortic valve regurgitation is  not visualized. No aortic stenosis is present.  5. The inferior vena cava is normal in size with greater than 50%  respiratory variability,  suggesting right atrial pressure of 3 mmHg.   FINDINGS  Left Ventricle: Left ventricular ejection fraction, by estimation, is 60  to 65%. The left ventricle has normal function. The left ventricle has no  regional wall motion abnormalities. The left ventricular internal cavity  size was normal in size. There is  no left ventricular hypertrophy. Left ventricular diastolic parameters  are consistent with Grade I diastolic dysfunction (impaired relaxation).  Elevated left ventricular end-diastolic pressure.   Right Ventricle: The right ventricular size is normal. No increase in  right ventricular wall thickness. Right ventricular systolic function is  normal.   Left Atrium: Left atrial size was normal in size.   Right Atrium: Right atrial size was normal in size.   Pericardium: There is no evidence of pericardial effusion.   Mitral Valve: The mitral valve is degenerative in appearance. Moderate  mitral annular calcification. No evidence of mitral valve regurgitation.  No evidence of mitral valve stenosis.   Tricuspid Valve: The tricuspid valve is normal in structure. Tricuspid  valve regurgitation is not demonstrated. No evidence of tricuspid  stenosis.   Aortic Valve: The aortic valve is normal in structure. Aortic valve  regurgitation is not visualized. No aortic stenosis is present.   Pulmonic Valve: The pulmonic valve was normal in structure. Pulmonic valve  regurgitation is not visualized. No evidence of pulmonic stenosis.   Aorta: The aortic root is normal in size and structure.   Venous: The inferior vena cava is normal in size with greater than 50%  respiratory variability, suggesting right atrial pressure of 3 mmHg.   IAS/Shunts: No atrial level shunt detected by color flow Doppler.     LEFT VENTRICLE  PLAX 2D  LVIDd:     3.30 cm Diastology  LVIDs:     1.60 cm LV e' medial:  4.03 cm/s  LV PW:     1.00 cm LV E/e' medial: 19.9  LV IVS:    1.00  cm LV e' lateral:  5.00 cm/s             LV E/e' lateral: 16.0     RIGHT VENTRICLE  RV Basal diam: 2.20 cm  RV S prime:   2.50 cm/s  TAPSE (M-mode): 2.0 cm   LEFT ATRIUM       Index    RIGHT ATRIUM     Index  LA Vol (A2C):  26.5 ml 15.64 ml/m RA Area:   8.27 cm  LA Vol (A4C):  23.7 ml 13.99 ml/m RA Volume:  15.00 ml 8.85 ml/m  LA Biplane Vol: 26.8 ml 15.82 ml/m  AORTIC VALVE  LVOT Vmax:  120.00 cm/s  LVOT Vmean: 84.000 cm/s  LVOT VTI:  0.220 m    AORTA  Ao Root diam: 2.60 cm  MITRAL VALVE  MV Area (PHT): 3.60 cm   SHUNTS  MV Decel Time: 211 msec   Systemic VTI: 0.22 m  MV E velocity: 80.20 cm/s  MV A velocity: 127.00 cm/s  MV E/A ratio: 0.63   Antimicrobials:  Anti-infectives (From admission, onward)   Start     Dose/Rate Route Frequency Ordered Stop   05/03/20 1500  azithromycin (ZITHROMAX) tablet 500 mg  Status:  Discontinued        500 mg Oral Daily 05/03/20 1500 05/03/20 1714   05/02/20 2200  azithromycin (ZITHROMAX) 500 mg in sodium chloride 0.9 % 250 mL IVPB  Status:  Discontinued        500 mg 250 mL/hr over 60 Minutes Intravenous Every 24 hours 05/01/20 2354 05/03/20 1500   05/02/20 2200  cefTRIAXone (ROCEPHIN) 1 g in sodium chloride 0.9 % 100 mL IVPB  Status:  Discontinued        1 g 200 mL/hr over 30 Minutes Intravenous Every 24 hours 05/01/20 2354 05/03/20 1714   05/01/20 2300  cefTRIAXone (ROCEPHIN) 1 g in sodium chloride 0.9 % 100 mL IVPB        1 g 200 mL/hr over 30 Minutes Intravenous  Once 05/01/20 2255 05/01/20 2347   05/01/20 2300  azithromycin (ZITHROMAX) 500 mg in sodium chloride 0.9 % 250 mL IVPB        500 mg 250 mL/hr over 60 Minutes Intravenous  Once 05/01/20 2255 05/02/20 0054        Subjective: Seen and examined at bedside and still remains dyspneic and continues to have have coughing.  States that she did not sleep very well last night.  No nausea or vomiting.  Feels fatigued.  Left arm  erythematous slightly due to her IV infiltrated.  Will be getting another thoracentesis today.  No lightheadedness or dizziness.  No other concerns or complaints this time  Objective: Vitals:   05/03/20 0512 05/03/20 1525 05/03/20 2136 05/04/20 0620  BP:  (!) 158/96 (!) 150/90 (!) 150/66  Pulse:  67 (!) 102 88  Resp:  20 16 20   Temp:  97.7 F (36.5 C) 97.7 F (36.5 C) 98.7 F (37.1 C)  TempSrc:   Oral Oral  SpO2:  94% 94% 93%  Weight: 64.7 kg     Height: 5\' 4"  (1.626 m)       Intake/Output Summary (Last 24 hours) at 05/04/2020 1322 Last data filed at 05/03/2020 1600 Gross per 24 hour  Intake 240 ml  Output --  Net 240 ml   Filed Weights   05/03/20 0512  Weight: 64.7 kg   Examination: Physical Exam:  Constitutional: WN/WD elderly Caucasian female appears calm but does appear uncomfortable again Eyes: Lids and conjunctivae normal, sclerae anicteric  ENMT: External Ears, Nose appear normal. Grossly normal hearing.   Neck: Appears normal, supple, no cervical masses, normal ROM, no appreciable thyromegaly: No JVD Respiratory: Diminished to auscultation bilaterally with coarse breath sounds worse on the left compared to the right and she does have some crackles and rhonchi.  She has a normal respiratory effort and she is not tachypneic or using any accessory muscles to breathe and is not wearing any supplemental oxygen via nasal cannula. Cardiovascular: RRR, no murmurs / rubs / gallops. S1 and S2 auscultated.  Has 1+ lower extremity edema but mainly this is nonpitting  Abdomen: Soft, non-tender, non-distended. Bowel sounds positive.  GU: Deferred. Musculoskeletal: No clubbing / cyanosis of digits/nails. No joint deformity upper and lower extremities.  Skin: No rashes, lesions, ulcers on limited skin evaluation. No induration; Warm and dry.  Neurologic: CN 2-12 grossly intact with no focal deficits. Romberg sign and cerebellar reflexes not assessed.  Psychiatric: Normal judgment  and insight. Alert and oriented x 3. Normal mood and appropriate affect.   Data Reviewed: I have personally reviewed following labs and imaging studies  CBC: Recent Labs  Lab 05/01/20 1632 05/02/20 0704 05/03/20 0213 05/04/20 0300  WBC 13.4* 14.7* 11.5* 13.9*  NEUTROABS  --   --  8.5* 10.3*  HGB 15.8* 14.8 13.7 13.7  HCT 45.4 43.9 38.3 40.0  MCV 86.3 87.1 86.1 86.4  PLT 473* 377 340 578   Basic Metabolic Panel: Recent Labs  Lab 05/01/20 1632 05/02/20 0704 05/03/20 0213 05/04/20 0300  NA 131* 131* 132* 134*  K 2.9* 4.1 3.7 3.7  CL 93* 99 102 104  CO2 25 20* 22 23  GLUCOSE 125* 82 95 108*  BUN 15 11 13 11   CREATININE 0.77 0.66 0.70 0.79  CALCIUM 10.6* 9.2 9.2 9.3  MG 2.2  --   --  2.1  PHOS  --   --   --  2.0*   GFR: Estimated Creatinine Clearance: 44.4 mL/min (by C-G formula based on SCr of 0.79 mg/dL). Liver Function Tests: Recent Labs  Lab 05/01/20 1943 05/02/20 0704 05/03/20 0213 05/04/20 0300  AST 40 26 22 22   ALT 39 29 23 25   ALKPHOS 61 48 43 46  BILITOT 1.2 1.0 0.9 0.7  PROT 6.8 5.1* 4.7* 5.0*  ALBUMIN 3.7 2.8* 2.4* 2.5*   No results for input(s): LIPASE, AMYLASE in the last 168 hours. No results for input(s): AMMONIA in the last 168 hours. Coagulation Profile: No results for input(s): INR, PROTIME in the last 168 hours. Cardiac Enzymes: No results for input(s): CKTOTAL, CKMB, CKMBINDEX, TROPONINI in the last 168 hours. BNP (last 3 results) No results for input(s): PROBNP in the last 8760 hours. HbA1C: No results for input(s): HGBA1C in the last 72 hours. CBG: No results for input(s): GLUCAP in the last 168 hours. Lipid Profile: No results for input(s): CHOL, HDL, LDLCALC, TRIG, CHOLHDL, LDLDIRECT in the last 72 hours. Thyroid Function Tests: No results for input(s): TSH, T4TOTAL, FREET4, T3FREE, THYROIDAB in the last 72 hours. Anemia Panel: No results for input(s): VITAMINB12, FOLATE, FERRITIN, TIBC, IRON, RETICCTPCT in the last 72  hours. Sepsis Labs: Recent Labs  Lab 05/02/20 0704  PROCALCITON <0.10    Recent Results (from the past 240 hour(s))  Resp Panel by RT-PCR (Flu A&B, Covid) Nasopharyngeal Swab     Status: None   Collection Time: 05/01/20  6:22 PM   Specimen: Nasopharyngeal Swab; Nasopharyngeal(NP) swabs in vial transport medium  Result Value Ref Range Status   SARS Coronavirus 2 by RT PCR NEGATIVE NEGATIVE Final    Comment: (NOTE) SARS-CoV-2 target nucleic acids are NOT DETECTED.  The SARS-CoV-2 RNA is generally detectable in upper respiratory specimens during the acute phase of infection. The lowest concentration of SARS-CoV-2 viral copies this assay can detect is 138 copies/mL. A negative result does not preclude SARS-Cov-2 infection and should not be used as the sole basis for treatment or other patient management decisions. A negative result may occur with  improper specimen collection/handling, submission of specimen other than nasopharyngeal swab, presence of viral mutation(s) within the areas targeted by this assay, and inadequate number of viral copies(<138 copies/mL). A negative result must be combined with clinical observations, patient history, and epidemiological information. The expected  result is Negative.  Fact Sheet for Patients:  EntrepreneurPulse.com.au  Fact Sheet for Healthcare Providers:  IncredibleEmployment.be  This test is no t yet approved or cleared by the [place] FDA and  has been authorized for detection and/or diagnosis of SARS-CoV-2 by FDA under an Emergency Use Authorization (EUA). This EUA will remain  in effect (meaning this test can be used) for the duration of the COVID-19 declaration under Section 564(b)(1) of the Act, 21 U.S.C.section 360bbb-3(b)(1), unless the authorization is terminated  or revoked sooner.       Influenza A by PCR NEGATIVE NEGATIVE Final   Influenza B by PCR NEGATIVE NEGATIVE Final     Comment: (NOTE) The Xpert Xpress SARS-CoV-2/FLU/RSV plus assay is intended as an aid in the diagnosis of influenza from Nasopharyngeal swab specimens and should not be used as a sole basis for treatment. Nasal washings and aspirates are unacceptable for Xpert Xpress SARS-CoV-2/FLU/RSV testing.  Fact Sheet for Patients: EntrepreneurPulse.com.au  Fact Sheet for Healthcare Providers: IncredibleEmployment.be  This test is not yet approved or cleared by the [place] FDA and has been authorized for detection and/or diagnosis of SARS-CoV-2 by FDA under an Emergency Use Authorization (EUA). This EUA will remain in effect (meaning this test can be used) for the duration of the COVID-19 declaration under Section 564(b)(1) of the Act, 21 U.S.C. section 360bbb-3(b)(1), unless the authorization is terminated or revoked.  Performed at [facility], [place] [address], [place], [place] [address]   Body fluid culture (includes gram stain)     Status: None (Preliminary result)   Collection Time: 05/01/20  8:38 PM   Specimen: Pleural Fluid  Result Value Ref Range Status   Specimen Description FLUID PLEURAL  Final   Special Requests NONE  Final   Gram Stain   Final    RARE WBC PRESENT, PREDOMINANTLY MONONUCLEAR NO ORGANISMS SEEN    Culture   Final    NO GROWTH 3 DAYS Performed at [facility], [place] [address] [place], [place] [address]    Report Status PENDING  Incomplete  Culture, blood (routine x 2)     Status: None (Preliminary result)   Collection Time: 05/01/20 11:05 PM   Specimen: BLOOD  Result Value Ref Range Status   Specimen Description BLOOD SITE NOT SPECIFIED  Final   Special Requests   Final    BOTTLES DRAWN AEROBIC AND ANAEROBIC Blood Culture adequate volume   Culture   Final    NO GROWTH 2 DAYS Performed at [facility], [address], [place], [place] [address]    Report Status PENDING  Incomplete  Culture, blood  (routine x 2)     Status: None (Preliminary result)   Collection Time: 05/02/20  2:52 PM   Specimen: BLOOD  Result Value Ref Range Status   Specimen Description BLOOD RIGHT ANTECUBITAL  Final   Special Requests   Final    BOTTLES DRAWN AEROBIC AND ANAEROBIC Blood Culture adequate volume   Culture   Final    NO GROWTH 2 DAYS Performed at [facility], [place] [address] [place], [place] [address]    Report Status PENDING  Incomplete     RN Pressure Injury Documentation:     Estimated body mass index is 24.48 kg/m as calculated from the following:   Height as of this encounter: 5\' 4"  (1.626 m).   Weight as of this encounter: 64.7 kg.  Malnutrition Type:    Malnutrition Characteristics:  Nutrition Interventions:    Radiology Studies: DG Chest 2 View  Result Date: 05/03/2020 CLINICAL DATA:  Shortness of breath, pleural effusion EXAM: CHEST - 2 VIEW COMPARISON:  CT chest, 05/02/2020, chest radiographs, 05/01/2020 FINDINGS: Large left, small right pleural effusions with associated atelectasis or consolidation are not significantly changed compared to prior examination. No new airspace opacity. The cardiac borders are largely obscured. Osseous structures are unremarkable. IMPRESSION: Large left, small right pleural effusions with associated atelectasis or consolidation are not significantly changed compared to prior examination. No new airspace opacity. Electronically Signed   By: Eddie Candle M.D.   On: 05/03/2020 08:22   DG CHEST PORT 1 VIEW  Result Date: 05/04/2020 CLINICAL DATA:  Shortness of breath.  Pleural effusion. EXAM: PORTABLE CHEST 1 VIEW COMPARISON:  May 03, 2020. FINDINGS: Increased size of a large left pleural effusion. Overlying left opacities. Similar versus slightly increased small right pleural effusion. No visible pneumothorax. Cardiac silhouette is largely obscured. No acute osseous abnormality. IMPRESSION: 1. Increased large left pleural effusion.  Overlying left opacities may represent pneumonia and/or atelectasis. 2. Similar versus slightly increased small right pleural effusion. Electronically Signed   By: Margaretha Sheffield MD   On: 05/04/2020 08:10   ECHOCARDIOGRAM COMPLETE  Result Date: 05/04/2020    ECHOCARDIOGRAM REPORT   Patient Name:   Shelley Flores Date of Exam: 05/04/2020 Medical Rec #:  [id number]          Height:       64.0 in Accession #:    [id number]         Weight:       142.6 lb Date of Birth:  10-Nov-1932           BSA:          1.695 m Patient Age:    18 years           BP:           150/66 mmHg Patient Gender: F                  HR:           88 bpm. Exam Location:  Inpatient Procedure: 2D Echo, Cardiac Doppler and Color Doppler Indications:    Dyspnea  History:        Patient has prior history of Echocardiogram examinations, most                 recent 03/27/2018. Signs/Symptoms:Shortness of Breath; Risk                 Factors:Hypertension, Dyslipidemia and Former Smoker. Large left                 pleural effusion.  Sonographer:    Dustin Flock Referring Phys: [phone] MURALI RAMASWAMY  Sonographer Comments: Technically challenging study due to limited acoustic windows. Difficult windows due to large pleural effusion. IMPRESSIONS  1. Left ventricular ejection fraction, by estimation, is 60 to 65%. The left ventricle has normal function. The left ventricle has no regional wall motion abnormalities. Left ventricular diastolic parameters are consistent with Grade I diastolic dysfunction (impaired relaxation). Elevated left ventricular end-diastolic pressure.  2. Right ventricular systolic function is normal. The right ventricular size is normal.  3. The mitral valve is degenerative. No evidence of mitral valve regurgitation. No evidence of mitral stenosis. Moderate mitral annular calcification.  4. The aortic valve is normal in structure. Aortic valve regurgitation is not visualized. No aortic stenosis is present.  5. The inferior  vena cava is normal in size with greater than 50% respiratory variability, suggesting right atrial pressure of 3 mmHg. FINDINGS  Left Ventricle: Left ventricular ejection fraction, by estimation, is 60 to 65%. The left ventricle has normal function. The left ventricle has no regional wall motion abnormalities. The left ventricular internal cavity size was normal in size. There is  no left ventricular hypertrophy. Left ventricular diastolic parameters are consistent with Grade I diastolic dysfunction (impaired relaxation). Elevated left ventricular end-diastolic pressure. Right Ventricle: The right ventricular size is normal. No increase in right ventricular wall thickness. Right ventricular systolic function is normal. Left Atrium: Left atrial size was normal in size. Right Atrium: Right atrial size was normal in size. Pericardium: There is no evidence of pericardial effusion. Mitral Valve: The mitral valve is degenerative in appearance. Moderate mitral annular calcification. No evidence of mitral valve regurgitation. No evidence of mitral valve stenosis. Tricuspid Valve: The tricuspid valve is normal in structure. Tricuspid valve regurgitation is not demonstrated. No evidence of tricuspid stenosis. Aortic Valve: The aortic valve is normal in structure. Aortic valve regurgitation is not visualized. No aortic stenosis is present. Pulmonic Valve: The pulmonic valve was normal in structure. Pulmonic valve regurgitation is not visualized. No evidence of pulmonic stenosis. Aorta: The aortic root is normal in size and structure. Venous: The inferior vena cava is normal in size with greater than 50% respiratory variability, suggesting right atrial pressure of 3 mmHg. IAS/Shunts: No atrial level shunt detected by color flow Doppler.  LEFT VENTRICLE PLAX 2D LVIDd:         3.30 cm Diastology LVIDs:         1.60 cm LV e' medial:    4.03 cm/s LV PW:         1.00 cm LV E/e' medial:  19.9 LV IVS:        1.00 cm LV e' lateral:    5.00 cm/s                        LV E/e' lateral: 16.0  RIGHT VENTRICLE RV Basal diam:  2.20 cm RV S prime:     2.50 cm/s TAPSE (M-mode): 2.0 cm LEFT ATRIUM             Index       RIGHT ATRIUM          Index LA Vol (A2C):   26.5 ml 15.64 ml/m RA Area:     8.27 cm LA Vol (A4C):   23.7 ml 13.99 ml/m RA Volume:   15.00 ml 8.85 ml/m LA Biplane Vol: 26.8 ml 15.82 ml/m  AORTIC VALVE LVOT Vmax:   120.00 cm/s LVOT Vmean:  84.000 cm/s LVOT VTI:    0.220 m  AORTA Ao Root diam: 2.60 cm MITRAL VALVE MV Area (PHT): 3.60 cm     SHUNTS MV Decel Time: 211 msec     Systemic VTI: 0.22 m MV E velocity: 80.20 cm/s MV A velocity: 127.00 cm/s MV E/A ratio:  0.63 Skeet Latch MD Electronically signed by Skeet Latch MD Signature Date/Time: 05/04/2020/11:23:20 AM    Final    Scheduled Meds: . atorvastatin  20 mg Oral QHS  . furosemide  40 mg Intravenous Once  . furosemide  40 mg Intravenous Once  . lidocaine      . metoprolol succinate  50 mg Oral Daily  . pantoprazole  40 mg Oral BID AC  . phosphorus  500 mg  Oral BID   Continuous Infusions:   LOS: 2 days   Kerney Elbe, DO [facility] PAGER is on AMION  If 7PM-7AM, please contact night-coverage [email or web address]

## 2020-05-04 NOTE — Telephone Encounter (Signed)
Triage  Patient has malignant pleural effusion with lung cncer suspected. Please ensuer followup first avial with Dr Valeta Harms to ensure all aspecs of patient care are covered  Thanks  MR

## 2020-05-04 NOTE — Plan of Care (Signed)
Patient denies pain or SOB. Awaiting CT scan.

## 2020-05-04 NOTE — Progress Notes (Signed)
  Echocardiogram 2D Echocardiogram has been performed.  Shelley Flores 05/04/2020, 8:55 AM

## 2020-05-04 NOTE — Procedures (Signed)
PROCEDURE SUMMARY:  Successful image-guided left thoracentesis. Yielded 1.55 liters of hazy amber fluid. Patient tolerated procedure well. No immediate complications. EBL = 0 mL.  Specimen was sent for labs. CXR ordered.  Please see imaging section of Epic for full dictation.   Claris Pong Langdon Crosson PA-C 05/04/2020 2:14 PM

## 2020-05-05 ENCOUNTER — Inpatient Hospital Stay (HOSPITAL_COMMUNITY): Payer: Medicare PPO

## 2020-05-05 DIAGNOSIS — J90 Pleural effusion, not elsewhere classified: Secondary | ICD-10-CM | POA: Diagnosis not present

## 2020-05-05 LAB — COMPREHENSIVE METABOLIC PANEL
ALT: 24 U/L (ref 0–44)
AST: 19 U/L (ref 15–41)
Albumin: 2.6 g/dL — ABNORMAL LOW (ref 3.5–5.0)
Alkaline Phosphatase: 45 U/L (ref 38–126)
Anion gap: 9 (ref 5–15)
BUN: 7 mg/dL — ABNORMAL LOW (ref 8–23)
CO2: 23 mmol/L (ref 22–32)
Calcium: 9.2 mg/dL (ref 8.9–10.3)
Chloride: 105 mmol/L (ref 98–111)
Creatinine, Ser: 0.69 mg/dL (ref 0.44–1.00)
GFR, Estimated: >60 mL/min (ref >60)
Glucose, Bld: 106 mg/dL — ABNORMAL HIGH (ref 70–99)
Potassium: 3.4 mmol/L — ABNORMAL LOW (ref 3.5–5.1)
Sodium: 137 mmol/L (ref 135–145)
Total Bilirubin: 0.7 mg/dL (ref 0.3–1.2)
Total Protein: 5 g/dL — ABNORMAL LOW (ref 6.5–8.1)

## 2020-05-05 LAB — CBC WITH DIFFERENTIAL/PLATELET
Abs Immature Granulocytes: 0.06 10*3/uL (ref 0.00–0.07)
Basophils Absolute: 0.1 10*3/uL (ref 0.0–0.1)
Basophils Relative: 0 %
Eosinophils Absolute: 0.2 10*3/uL (ref 0.0–0.5)
Eosinophils Relative: 2 %
HCT: 40.7 % (ref 36.0–46.0)
Hemoglobin: 13.9 g/dL (ref 12.0–15.0)
Immature Granulocytes: 1 %
Lymphocytes Relative: 13 %
Lymphs Abs: 1.5 10*3/uL (ref 0.7–4.0)
MCH: 29.6 pg (ref 26.0–34.0)
MCHC: 34.2 g/dL (ref 30.0–36.0)
MCV: 86.8 fL (ref 80.0–100.0)
Monocytes Absolute: 1.1 10*3/uL — ABNORMAL HIGH (ref 0.1–1.0)
Monocytes Relative: 10 %
Neutro Abs: 8.6 10*3/uL — ABNORMAL HIGH (ref 1.7–7.7)
Neutrophils Relative %: 74 %
Platelets: 349 10*3/uL (ref 150–400)
RBC: 4.69 MIL/uL (ref 3.87–5.11)
RDW: 15.2 % (ref 11.5–15.5)
WBC: 11.5 10*3/uL — ABNORMAL HIGH (ref 4.0–10.5)
nRBC: 0 % (ref 0.0–0.2)

## 2020-05-05 LAB — TRIGLYCERIDES, BODY FLUIDS: Triglycerides, Fluid: 28 mg/dL

## 2020-05-05 LAB — ACID FAST SMEAR (AFB, MYCOBACTERIA): Acid Fast Smear: NEGATIVE

## 2020-05-05 LAB — BODY FLUID CULTURE: Culture: NO GROWTH

## 2020-05-05 LAB — MAGNESIUM: Magnesium: 2.1 mg/dL (ref 1.7–2.4)

## 2020-05-05 LAB — PHOSPHORUS: Phosphorus: 3.6 mg/dL (ref 2.5–4.6)

## 2020-05-05 MED ORDER — POTASSIUM CHLORIDE CRYS ER 20 MEQ PO TBCR
40.0000 meq | EXTENDED_RELEASE_TABLET | Freq: Once | ORAL | Status: AC
  Administered 2020-05-05: 40 meq via ORAL
  Filled 2020-05-05: qty 2

## 2020-05-05 MED ORDER — IOHEXOL 300 MG/ML  SOLN
70.0000 mL | Freq: Once | INTRAMUSCULAR | Status: AC | PRN
  Administered 2020-05-05: 70 mL via INTRAVENOUS

## 2020-05-05 NOTE — Progress Notes (Signed)
PROGRESS NOTE    Shelley Flores  JKK:[id number] DOB: 1933-02-25 DOA: 05/01/2020 PCP: Cari Caraway, MD    Brief Narrative: Joel Mericle Forresteris a 86 y.o.femalewith medical history significant ofhypertension, hyperlipidemia, mild valvular heart disease presenting with complaints of shortness of breath.Patient states she has been coughing since the end of October and was seen by her doctor who thought the cough was related to her having acid reflux so she was started on Prilosec but her cough is persisted. For the past 10 days she has had dyspnea on exertion and fatigue. She had an x-ray done on 12/17 which revealed a large left pleural effusion. She was supposed to get IR thoracentesis but it has not been scheduled yet. Since her dyspnea was worsening she came into the ED today to be evaluated. Denies fevers and states she has been fully vaccinated against Covid. Denies chest pain. Denies prior history of pleural effusion/thoracentesis. Denies history of malignancy. She smoked cigarettes for about 4 years back in her 1s when she was in graduate school but not since then. Denies unintentional weight loss. Reports having abdominal distention for several months. Also complaining of early satiety/loss of appetite. No other complaints.  ED Course:Afebrile. Not tachycardic. Tachypneic with respiratory rate in the 20s. Blood pressure elevated with systolic up to 716R. WBC 13.4, hemoglobin 15.8, hematocrit 45.4, platelet 473K.Sodium 131, potassium 2.9, chloride 93, bicarb 25, BUN 15, creatinine 0.7, glucose 125. Magnesium 2.2. BNP 70. SARS-CoV-2 PCR test and influenza panel both negative. Blood culture x2 pending. Chest x-ray showing a large left pleural effusion with subtotal atelectasis of the left lung and mediastinal shift left to right, little change since 04/28/2020. Underwent thoracentesis for large left pleural effusion in the ED and 1500 cc fluid removed. Repeat chest  x-ray showing interval decrease in size of a now small to moderate volume left pleural effusion; underlying infection, inflammation, pulmonary mass not excluded; trace right pleural effusion. Not hypoxic at rest but sats dropped to 90% with ambulation. She was given ceftriaxone and azithromycin. Also given oral potassium 40 mEq for hypokalemia. Admission requested for observation.  Assessment & Plan:   Principal Problem:   Pleural effusion Active Problems:   Essential hypertension   Acute hypoxemic respiratory failure (HCC)   Hyponatremia   Hypokalemia  #1 acute hypoxic respiratory failure secondary to large left pleural effusion-chest x-ray showed large left pleural effusion with subtotal atelectasis of the left lung and mediastinal shift to the right little change since 04/28/2020.  Patient underwent thoracentesis 05/04/2020 and 1500 cc fluid was removed   Repeat chest x-ray on 1221 showed decrease in the effusion. Pleural fluid cytology was positive for metastatic adenocarcinoma and likely primary lung adenocarcinoma.  There was no growth on the pleural fluid. PCCM was consulted she will follow-up with Dr. Sharma Covert on discharge Oncology was consulted recommending outpatient follow-up for molecular testing and PET scan. Cardiothoracic surgery was consulted they did not have any further recommendations. IR was consulted for Pleurx catheter placement but since she just had thoracentesis and was tapped dry will need to wait for pleural fluid to recur in order to place Pleurx catheter safely.  Earliest would be on Monday. Antibiotics were stopped due to low procalcitonin.    CT Scan ordered for further evaluation and showed "Moderate left and small right pleural effusions. The left pleural effusion demonstrates areas of conspicuous pleural thickening and small pleural nodularity. While this could reflect empyema, a malignant effusion with pleural deposits would present similarly. Furthermore,  there  is heterogeneous enhancement of the adjacent atelectatic lung parenchyma including within the lingula, anterior segment left upper lobe and anterior basal segment of the left lower lobe. Could reflect underlying airspace disease or malignancy with additional airspace disease seen throughout aerated portions of the left lung. Asymmetric soft tissue edema across the left chest wall. No clear extension of the pleural or airspace processes in to the chest wall proper. Possibly reactive though should with exam findings and continued attention on follow-up imaging is recommended. Some questionable thickening of the rectosigmoid though may be related to underdistention though some mild perirectal fat stranding is present however and could suggest an underlying proctitis. Aortic Atherosclerosis     #2 mild hyponatremia sodium up to 134 from 131.  This is likely due to home thiazide.  Sodium normalized to 137.  #3 hypokalemia potassium 3.4 replete magnesium 2.1.  #4 history of essential hypertension she was on amlodipine prior to admission which has been stopped due to bilateral lower extremity edema currently she is on metoprolol continue the same.  #5 hypophosphatemia resolved  #6 early satiety and abdominal distention patient have CT of the abdomen to evaluate for any mass or ascites.  #7 hyperlipidemia continue statin  Estimated body mass index is 24.48 kg/m as calculated from the following:   Height as of this encounter: 5\' 4"  (1.626 m).   Weight as of this encounter: 64.7 kg.  DVT prophylaxis: Lovenox  code Status DNR  family Communication: None at bedside  disposition Plan:  Status is: Inpatient  Dispo: The patient is from: Home              Anticipated d/c is to: SNF              Anticipated d/c date is: > 3 days              Patient currently is not medically stable to d/c.   Consultants:   PCCM, interventional radiology thoracic surgery and oncology  Procedures thoracentesis  04/01/2020 removed 1.5 L thoracentesis 04/04/2020 removed 1.5 L echo with normal ejection fraction.  Antimicrobials: None Subjective: Patient ambulated to the restroom and came back with a walker awake alert feels her breathing is hard but better  Objective: Vitals:   05/04/20 1458 05/04/20 2144 05/05/20 0641 05/05/20 0752  BP: (!) 151/85 (!) 153/83 (!) 146/72 138/74  Pulse: 88 84 83   Resp:  20 20 19   Temp: 98.7 F (37.1 C) 98.8 F (37.1 C) 97.8 F (36.6 C) 98.2 F (36.8 C)  TempSrc:  Oral Oral Oral  SpO2: 99% 95% 95% 94%  Weight:      Height:       No intake or output data in the 24 hours ending 05/05/20 1235 Filed Weights   05/03/20 0512  Weight: 64.7 kg    Examination:  General exam: Appears calm and comfortable  Respiratory system: Decreased breath sounds at the bases to auscultation. Respiratory effort normal. Cardiovascular system: S1 & S2 heard, RRR. No JVD, murmurs, rubs, gallops or clicks. No pedal edema. Gastrointestinal system: Abdomen is distended, soft and nontender. No organomegaly or masses felt. Normal bowel sounds heard. Central nervous system: Alert and oriented. No focal neurological deficits. Extremities: 1+ nonpitting edema Skin: No rashes, lesions or ulcers Psychiatry: Judgement and insight appear normal. Mood & affect appropriate.     Data Reviewed: I have personally reviewed following labs and imaging studies  CBC: Recent Labs  Lab 05/01/20 1632 05/02/20 0704 05/03/20 0213 05/04/20 0300  05/05/20 0314  WBC 13.4* 14.7* 11.5* 13.9* 11.5*  NEUTROABS  --   --  8.5* 10.3* 8.6*  HGB 15.8* 14.8 13.7 13.7 13.9  HCT 45.4 43.9 38.3 40.0 40.7  MCV 86.3 87.1 86.1 86.4 86.8  PLT 473* 377 340 346 845   Basic Metabolic Panel: Recent Labs  Lab 05/01/20 1632 05/02/20 0704 05/03/20 0213 05/04/20 0300 05/05/20 0314  NA 131* 131* 132* 134* 137  K 2.9* 4.1 3.7 3.7 3.4*  CL 93* 99 102 104 105  CO2 25 20* 22 23 23   GLUCOSE 125* 82 95 108* 106*   BUN 15 11 13 11  7*  CREATININE 0.77 0.66 0.70 0.79 0.69  CALCIUM 10.6* 9.2 9.2 9.3 9.2  MG 2.2  --   --  2.1 2.1  PHOS  --   --   --  2.0* 3.6   GFR: Estimated Creatinine Clearance: 44.4 mL/min (by C-G formula based on SCr of 0.69 mg/dL). Liver Function Tests: Recent Labs  Lab 05/01/20 1943 05/02/20 0704 05/03/20 0213 05/04/20 0300 05/05/20 0314  AST 40 26 22 22 19   ALT 39 29 23 25 24   ALKPHOS 61 48 43 46 45  BILITOT 1.2 1.0 0.9 0.7 0.7  PROT 6.8 5.1* 4.7* 5.0* 5.0*  ALBUMIN 3.7 2.8* 2.4* 2.5* 2.6*   No results for input(s): LIPASE, AMYLASE in the last 168 hours. No results for input(s): AMMONIA in the last 168 hours. Coagulation Profile: No results for input(s): INR, PROTIME in the last 168 hours. Cardiac Enzymes: No results for input(s): CKTOTAL, CKMB, CKMBINDEX, TROPONINI in the last 168 hours. BNP (last 3 results) No results for input(s): PROBNP in the last 8760 hours. HbA1C: No results for input(s): HGBA1C in the last 72 hours. CBG: No results for input(s): GLUCAP in the last 168 hours. Lipid Profile: No results for input(s): CHOL, HDL, LDLCALC, TRIG, CHOLHDL, LDLDIRECT in the last 72 hours. Thyroid Function Tests: No results for input(s): TSH, T4TOTAL, FREET4, T3FREE, THYROIDAB in the last 72 hours. Anemia Panel: No results for input(s): VITAMINB12, FOLATE, FERRITIN, TIBC, IRON, RETICCTPCT in the last 72 hours. Sepsis Labs: Recent Labs  Lab 05/02/20 0704  PROCALCITON <0.10    Recent Results (from the past 240 hour(s))  Resp Panel by RT-PCR (Flu A&B, Covid) Nasopharyngeal Swab     Status: None   Collection Time: 05/01/20  6:22 PM   Specimen: Nasopharyngeal Swab; Nasopharyngeal(NP) swabs in vial transport medium  Result Value Ref Range Status   SARS Coronavirus 2 by RT PCR NEGATIVE NEGATIVE Final    Comment: (NOTE) SARS-CoV-2 target nucleic acids are NOT DETECTED.  The SARS-CoV-2 RNA is generally detectable in upper respiratory specimens during the  acute phase of infection. The lowest concentration of SARS-CoV-2 viral copies this assay can detect is 138 copies/mL. A negative result does not preclude SARS-Cov-2 infection and should not be used as the sole basis for treatment or other patient management decisions. A negative result may occur with  improper specimen collection/handling, submission of specimen other than nasopharyngeal swab, presence of viral mutation(s) within the areas targeted by this assay, and inadequate number of viral copies(<138 copies/mL). A negative result must be combined with clinical observations, patient history, and epidemiological information. The expected result is Negative.  Fact Sheet for Patients:  EntrepreneurPulse.com.au  Fact Sheet for Healthcare Providers:  IncredibleEmployment.be  This test is no t yet approved or cleared by the [place] FDA and  has been authorized for detection and/or diagnosis of SARS-CoV-2 by FDA  under an Emergency Use Authorization (EUA). This EUA will remain  in effect (meaning this test can be used) for the duration of the COVID-19 declaration under Section 564(b)(1) of the Act, 21 U.S.C.section 360bbb-3(b)(1), unless the authorization is terminated  or revoked sooner.       Influenza A by PCR NEGATIVE NEGATIVE Final   Influenza B by PCR NEGATIVE NEGATIVE Final    Comment: (NOTE) The Xpert Xpress SARS-CoV-2/FLU/RSV plus assay is intended as an aid in the diagnosis of influenza from Nasopharyngeal swab specimens and should not be used as a sole basis for treatment. Nasal washings and aspirates are unacceptable for Xpert Xpress SARS-CoV-2/FLU/RSV testing.  Fact Sheet for Patients: EntrepreneurPulse.com.au  Fact Sheet for Healthcare Providers: IncredibleEmployment.be  This test is not yet approved or cleared by the [place] FDA and has been authorized for detection and/or  diagnosis of SARS-CoV-2 by FDA under an Emergency Use Authorization (EUA). This EUA will remain in effect (meaning this test can be used) for the duration of the COVID-19 declaration under Section 564(b)(1) of the Act, 21 U.S.C. section 360bbb-3(b)(1), unless the authorization is terminated or revoked.  Performed at [facility], [place] [address] [place], [place] [address]   Body fluid culture (includes gram stain)     Status: None   Collection Time: 05/01/20  8:38 PM   Specimen: Pleural Fluid  Result Value Ref Range Status   Specimen Description FLUID PLEURAL  Final   Special Requests NONE  Final   Gram Stain   Final    RARE WBC PRESENT, PREDOMINANTLY MONONUCLEAR NO ORGANISMS SEEN    Culture   Final    NO GROWTH Performed at [facility], [place] [address] [place], [place] [address]    Report Status 05/05/2020 FINAL  Final  Culture, blood (routine x 2)     Status: None (Preliminary result)   Collection Time: 05/01/20 11:05 PM   Specimen: BLOOD  Result Value Ref Range Status   Specimen Description BLOOD SITE NOT SPECIFIED  Final   Special Requests   Final    BOTTLES DRAWN AEROBIC AND ANAEROBIC Blood Culture adequate volume   Culture   Final    NO GROWTH 2 DAYS Performed at [facility], [place] [address], [place], [place] [address]    Report Status PENDING  Incomplete  Culture, blood (routine x 2)     Status: None (Preliminary result)   Collection Time: 05/02/20  2:52 PM   Specimen: BLOOD  Result Value Ref Range Status   Specimen Description BLOOD RIGHT ANTECUBITAL  Final   Special Requests   Final    BOTTLES DRAWN AEROBIC AND ANAEROBIC Blood Culture adequate volume   Culture   Final    NO GROWTH 2 DAYS Performed at [facility], [place] [address] [place], [place] [address]    Report Status PENDING  Incomplete  Body fluid culture     Status: None (Preliminary result)   Collection Time: 05/04/20  2:09 PM   Specimen: Lung, Left; Pleural Fluid  Result  Value Ref Range Status   Specimen Description PLEURAL FLUID  Final   Special Requests LEFT LUNG  Final   Gram Stain PENDING  Incomplete   Culture   Final    NO GROWTH < 24 HOURS Performed at [facility], [place] [address], [place], [place] [address]    Report Status PENDING  Incomplete         Radiology Studies: DG  Chest 1 View  Result Date: 05/04/2020 CLINICAL DATA:  Post thoracentesis on the left. EXAM: CHEST  1 VIEW COMPARISON:  Radiographs earlier today and 05/03/2020. CT 05/02/2020. FINDINGS: 1416 hours. Interval decreased size of left pleural effusion following thoracentesis. There is improved aeration of the left lung with persistent left perihilar opacity which may reflect a mass or infiltrate. No pneumothorax. The right lung remains clear. There is a stable small right pleural effusion. The heart size and mediastinal contours are stable. IMPRESSION: 1. Interval decreased size of left pleural effusion following thoracentesis with improved aeration of the left lung. No pneumothorax. 2. Persistent left perihilar opacity may reflect a mass or infiltrate. Electronically Signed   By: Richardean Sale M.D.   On: 05/04/2020 14:49   CT CHEST W CONTRAST  Result Date: 05/04/2020 CLINICAL DATA:  Pleural effusion, left thoracentesis, abnormal chest x-ray EXAM: CT CHEST WITH CONTRAST TECHNIQUE: Multidetector CT imaging of the chest was performed during intravenous contrast administration. CONTRAST:  90mL OMNIPAQUE IOHEXOL 300 MG/ML  SOLN COMPARISON:  05/02/2020 FINDINGS: Cardiovascular: The heart is unremarkable without pericardial effusion. There is normal caliber of the thoracic aorta with no aneurysm or dissection. Minimal atherosclerosis. No evidence of pulmonary embolus. There is extrinsic compression of the left upper lobe pulmonary artery. Mediastinum/Nodes: Subcarinal adenopathy measuring 14 mm in short axis. Thyroid, trachea, and esophagus are unremarkable. Lungs/Pleura: There is  masslike consolidation of the left upper lobe abutting the mediastinum, measuring 4.5 x 4.1 x 4.6 cm (see image 61/3 and image 59/5). Medial extent of this mass invades the mediastinum at the level of the aortic or pulmonary window, reference image 57/3 and 59/5. Findings are consistent with underlying malignancy and postobstructive change. PET CT may better delineate the mass from the postobstructive change. Bronchoscopy may be useful for tissue diagnosis if recent thoracentesis was nondiagnostic for malignancy. There are small bilateral pleural effusions. On the left, there is a small loculated component in the anterior left hemithorax adjacent to the left upper lobe mass described above. Scattered hypoventilatory changes are seen at the lung bases and within the lingula. Interstitial prominence within the left upper lobe could reflect lymphangitic spread of disease. Upper Abdomen: Complex left renal cysts are unchanged. No acute upper abdominal process. Musculoskeletal: No acute or destructive bony lesions. Reconstructed images demonstrate no additional findings. IMPRESSION: 1. Left upper lobe masslike consolidation consistent with malignancy. A portion of the mass invades the mediastinum at the level of the aortic or pulmonary window. PET-CT may be useful to delineate the mass from the postobstructive change. 2. Prominence of the left upper lobe pulmonary interstitium, concerning for lymphangitic spread of disease. 3. A subcarinal lymphadenopathy worrisome for nodal metastasis. Again, PET-CT may be useful. 4. Small bilateral pleural effusions, partially loculated on the left. Decreased left effusion after recent thoracentesis. 5.  Aortic Atherosclerosis (ICD10-I70.0). Electronically Signed   By: Randa Ngo M.D.   On: 05/04/2020 23:09   DG CHEST PORT 1 VIEW  Result Date: 05/05/2020 CLINICAL DATA:  SOB. Hx of valvular heart disease and HTN EXAM: PORTABLE CHEST 1 VIEW COMPARISON:  05/04/2020 FINDINGS:  Normal cardiac silhouette. Dense streaky LEFT lobe opacity increased from prior. Obscuration of the heart border. Small LEFT effusion. RIGHT lung is clear. Lungs are hyperinflated. No pneumothorax IMPRESSION: LEFT lower lobe and lingular pneumonia and probable effusion. Electronically Signed   By: Suzy Bouchard M.D.   On: 05/05/2020 09:04   DG CHEST PORT 1 VIEW  Result Date: 05/04/2020 CLINICAL DATA:  Shortness of  breath.  Pleural effusion. EXAM: PORTABLE CHEST 1 VIEW COMPARISON:  May 03, 2020. FINDINGS: Increased size of a large left pleural effusion. Overlying left opacities. Similar versus slightly increased small right pleural effusion. No visible pneumothorax. Cardiac silhouette is largely obscured. No acute osseous abnormality. IMPRESSION: 1. Increased large left pleural effusion. Overlying left opacities may represent pneumonia and/or atelectasis. 2. Similar versus slightly increased small right pleural effusion. Electronically Signed   By: Margaretha Sheffield MD   On: 05/04/2020 08:10   ECHOCARDIOGRAM COMPLETE  Result Date: 05/04/2020    ECHOCARDIOGRAM REPORT   Patient Name:   LEALA BRYAND Date of Exam: 05/04/2020 Medical Rec #:  [id number]          Height:       64.0 in Accession #:    [id number]         Weight:       142.6 lb Date of Birth:  07-Mar-1933           BSA:          1.695 m Patient Age:    7 years           BP:           150/66 mmHg Patient Gender: F                  HR:           88 bpm. Exam Location:  Inpatient Procedure: 2D Echo, Cardiac Doppler and Color Doppler Indications:    Dyspnea  History:        Patient has prior history of Echocardiogram examinations, most                 recent 03/27/2018. Signs/Symptoms:Shortness of Breath; Risk                 Factors:Hypertension, Dyslipidemia and Former Smoker. Large left                 pleural effusion.  Sonographer:    Dustin Flock Referring Phys: [phone] MURALI RAMASWAMY  Sonographer Comments: Technically challenging  study due to limited acoustic windows. Difficult windows due to large pleural effusion. IMPRESSIONS  1. Left ventricular ejection fraction, by estimation, is 60 to 65%. The left ventricle has normal function. The left ventricle has no regional wall motion abnormalities. Left ventricular diastolic parameters are consistent with Grade I diastolic dysfunction (impaired relaxation). Elevated left ventricular end-diastolic pressure.  2. Right ventricular systolic function is normal. The right ventricular size is normal.  3. The mitral valve is degenerative. No evidence of mitral valve regurgitation. No evidence of mitral stenosis. Moderate mitral annular calcification.  4. The aortic valve is normal in structure. Aortic valve regurgitation is not visualized. No aortic stenosis is present.  5. The inferior vena cava is normal in size with greater than 50% respiratory variability, suggesting right atrial pressure of 3 mmHg. FINDINGS  Left Ventricle: Left ventricular ejection fraction, by estimation, is 60 to 65%. The left ventricle has normal function. The left ventricle has no regional wall motion abnormalities. The left ventricular internal cavity size was normal in size. There is  no left ventricular hypertrophy. Left ventricular diastolic parameters are consistent with Grade I diastolic dysfunction (impaired relaxation). Elevated left ventricular end-diastolic pressure. Right Ventricle: The right ventricular size is normal. No increase in right ventricular wall thickness. Right ventricular systolic function is normal. Left Atrium: Left atrial size was normal in size. Right Atrium: Right atrial size was normal  in size. Pericardium: There is no evidence of pericardial effusion. Mitral Valve: The mitral valve is degenerative in appearance. Moderate mitral annular calcification. No evidence of mitral valve regurgitation. No evidence of mitral valve stenosis. Tricuspid Valve: The tricuspid valve is normal in structure.  Tricuspid valve regurgitation is not demonstrated. No evidence of tricuspid stenosis. Aortic Valve: The aortic valve is normal in structure. Aortic valve regurgitation is not visualized. No aortic stenosis is present. Pulmonic Valve: The pulmonic valve was normal in structure. Pulmonic valve regurgitation is not visualized. No evidence of pulmonic stenosis. Aorta: The aortic root is normal in size and structure. Venous: The inferior vena cava is normal in size with greater than 50% respiratory variability, suggesting right atrial pressure of 3 mmHg. IAS/Shunts: No atrial level shunt detected by color flow Doppler.  LEFT VENTRICLE PLAX 2D LVIDd:         3.30 cm Diastology LVIDs:         1.60 cm LV e' medial:    4.03 cm/s LV PW:         1.00 cm LV E/e' medial:  19.9 LV IVS:        1.00 cm LV e' lateral:   5.00 cm/s                        LV E/e' lateral: 16.0  RIGHT VENTRICLE RV Basal diam:  2.20 cm RV S prime:     2.50 cm/s TAPSE (M-mode): 2.0 cm LEFT ATRIUM             Index       RIGHT ATRIUM          Index LA Vol (A2C):   26.5 ml 15.64 ml/m RA Area:     8.27 cm LA Vol (A4C):   23.7 ml 13.99 ml/m RA Volume:   15.00 ml 8.85 ml/m LA Biplane Vol: 26.8 ml 15.82 ml/m  AORTIC VALVE LVOT Vmax:   120.00 cm/s LVOT Vmean:  84.000 cm/s LVOT VTI:    0.220 m  AORTA Ao Root diam: 2.60 cm MITRAL VALVE MV Area (PHT): 3.60 cm     SHUNTS MV Decel Time: 211 msec     Systemic VTI: 0.22 m MV E velocity: 80.20 cm/s MV A velocity: 127.00 cm/s MV E/A ratio:  0.63 Skeet Latch MD Electronically signed by Skeet Latch MD Signature Date/Time: 05/04/2020/11:23:20 AM    Final    VAS [place] LOWER EXTREMITY VENOUS (DVT)  Result Date: 05/04/2020  Lower Venous DVT Study Indications: Dyspnea and fatigue x 10 days.  Risk Factors: Recent pleural effusion and thoracentesis w/ suspected malignancy. Performing Technologist: Rogelia Rohrer  Examination Guidelines: A complete evaluation includes B-mode imaging, spectral Doppler, color Doppler,  and power Doppler as needed of all accessible portions of each vessel. Bilateral testing is considered an integral part of a complete examination. Limited examinations for reoccurring indications may be performed as noted. The reflux portion of the exam is performed with the patient in reverse Trendelenburg.  +---------+---------------+---------+-----------+----------+--------------+ RIGHT    CompressibilityPhasicitySpontaneityPropertiesThrombus Aging +---------+---------------+---------+-----------+----------+--------------+ CFV      Full           Yes      Yes                                 +---------+---------------+---------+-----------+----------+--------------+ SFJ      Full                                                        +---------+---------------+---------+-----------+----------+--------------+  FV Prox  Full           Yes      Yes                                 +---------+---------------+---------+-----------+----------+--------------+ FV Mid   Full           Yes      Yes                                 +---------+---------------+---------+-----------+----------+--------------+ FV DistalFull           Yes      Yes                                 +---------+---------------+---------+-----------+----------+--------------+ PFV      Full                                                        +---------+---------------+---------+-----------+----------+--------------+ POP      Full           Yes      Yes                                 +---------+---------------+---------+-----------+----------+--------------+ PTV      Full                                                        +---------+---------------+---------+-----------+----------+--------------+ PERO     Full                                                        +---------+---------------+---------+-----------+----------+--------------+    +---------+---------------+---------+-----------+----------+--------------+ LEFT     CompressibilityPhasicitySpontaneityPropertiesThrombus Aging +---------+---------------+---------+-----------+----------+--------------+ CFV      Full           Yes      Yes                                 +---------+---------------+---------+-----------+----------+--------------+ SFJ      Full                                                        +---------+---------------+---------+-----------+----------+--------------+ FV Prox  Full           Yes      Yes                                 +---------+---------------+---------+-----------+----------+--------------+ FV Mid   Full  Yes      Yes                                 +---------+---------------+---------+-----------+----------+--------------+ FV DistalFull           Yes      Yes                                 +---------+---------------+---------+-----------+----------+--------------+ PFV      Full                                                        +---------+---------------+---------+-----------+----------+--------------+ POP      Full           Yes      Yes                                 +---------+---------------+---------+-----------+----------+--------------+ PTV      Full                                                        +---------+---------------+---------+-----------+----------+--------------+ PERO     Full                                                        +---------+---------------+---------+-----------+----------+--------------+     Summary: RIGHT: - There is no evidence of deep vein thrombosis in the lower extremity.  - No cystic structure found in the popliteal fossa.  LEFT: - There is no evidence of deep vein thrombosis in the lower extremity.  - No cystic structure found in the popliteal fossa.  *See table(s) above for measurements and observations. Electronically signed  by Monica Martinez MD on 05/04/2020 at 7:55:52 PM.    Final    IR THORACENTESIS ASP PLEURAL SPACE W/IMG GUIDE  Result Date: 05/04/2020 INDICATION: Patient with history of acute hypoxic respiratory failure secondary to recurrent left pleural effusion. Request is made for diagnostic and therapeutic left thoracentesis. EXAM: ULTRASOUND GUIDED DIAGNOSTIC AND THERAPEUTIC THORACENTESIS MEDICATIONS: 10 mL 2% lidocaine COMPLICATIONS: None immediate. PROCEDURE: An ultrasound guided thoracentesis was thoroughly discussed with the patient and questions answered. The benefits, risks, alternatives and complications were also discussed. The patient understands and wishes to proceed with the procedure. Written consent was obtained. Ultrasound was performed to localize and mark an adequate pocket of fluid in the left chest. The area was then prepped and draped in the normal sterile fashion. 1% Lidocaine was used for local anesthesia. Under ultrasound guidance a 6 Fr Safe-T-Centesis catheter was introduced. Thoracentesis was performed. The catheter was removed and a dressing applied. FINDINGS: A total of approximately 1.55 L of hazy amber fluid was removed. Samples were sent to the laboratory as requested by the clinical team. IMPRESSION: Successful ultrasound guided left thoracentesis yielding 1.55 L of pleural fluid. Read by:  Earley Abide, PA-C Electronically Signed   By: Markus Daft M.D.   On: 05/04/2020 15:12        Scheduled Meds: . atorvastatin  20 mg Oral QHS  . furosemide  40 mg Intravenous Once  . metoprolol succinate  50 mg Oral Daily  . pantoprazole  40 mg Oral BID AC   Continuous Infusions:   LOS: 3 days   Georgette Shell, MD  05/05/2020, 12:35 PM

## 2020-05-06 ENCOUNTER — Inpatient Hospital Stay (HOSPITAL_COMMUNITY): Payer: Medicare PPO

## 2020-05-06 DIAGNOSIS — J90 Pleural effusion, not elsewhere classified: Secondary | ICD-10-CM | POA: Diagnosis not present

## 2020-05-06 NOTE — Plan of Care (Signed)
  Problem: Clinical Measurements: Goal: Ability to maintain clinical measurements within normal limits will improve Outcome: Progressing Goal: Will remain free from infection Outcome: Progressing Goal: Diagnostic test results will improve Outcome: Progressing Goal: Respiratory complications will improve Outcome: Progressing Goal: Cardiovascular complication will be avoided Outcome: Progressing   Problem: Activity: Goal: Risk for activity intolerance will decrease Outcome: Progressing   Problem: Nutrition: Goal: Adequate nutrition will be maintained Outcome: Progressing   Problem: Coping: Goal: Level of anxiety will decrease Outcome: Completed/Met   Problem: Elimination: Goal: Will not experience complications related to urinary retention Outcome: Completed/Met   Problem: Pain Managment: Goal: General experience of comfort will improve Outcome: Completed/Met   Problem: Safety: Goal: Ability to remain free from injury will improve Outcome: Progressing   Problem: Skin Integrity: Goal: Risk for impaired skin integrity will decrease Outcome: Progressing

## 2020-05-06 NOTE — Progress Notes (Signed)
[facility] informed via chat O2 88% on room air gave IS reached 250 made goal 500 placed on 3 L O2 stats 91%

## 2020-05-06 NOTE — Plan of Care (Signed)
  Problem: Education: Goal: Knowledge of General Education information will improve Description: Including pain rating scale, medication(s)/side effects and non-pharmacologic comfort measures Outcome: Progressing   Problem: Health Behavior/Discharge Planning: Goal: Ability to manage health-related needs will improve Outcome: Progressing   Problem: Clinical Measurements: Goal: Will remain free from infection Outcome: Progressing Goal: Diagnostic test results will improve Outcome: Progressing Goal: Respiratory complications will improve Outcome: Progressing Goal: Cardiovascular complication will be avoided Outcome: Progressing   Problem: Activity: Goal: Risk for activity intolerance will decrease Outcome: Progressing   Problem: Nutrition: Goal: Adequate nutrition will be maintained Outcome: Progressing

## 2020-05-06 NOTE — Progress Notes (Signed)
PROGRESS NOTE    Shelley Flores  MHD:[id number] DOB: [birth year] DOA: 05/01/2020 PCP: Cari Caraway, MD    Brief Narrative: Amariya Liskey Forresteris a 86 y.o.femalewith medical history significant ofhypertension, hyperlipidemia, mild valvular heart disease presenting with complaints of shortness of breath.Patient states she has been coughing since the end of October and was seen by her doctor who thought the cough was related to her having acid reflux so she was started on Prilosec but her cough is persisted. For the past 10 days she has had dyspnea on exertion and fatigue. She had an x-ray done on 12/17 which revealed a large left pleural effusion. She was supposed to get IR thoracentesis but it has not been scheduled yet. Since her dyspnea was worsening she came into the ED today to be evaluated. Denies fevers and states she has been fully vaccinated against Covid. Denies chest pain. Denies prior history of pleural effusion/thoracentesis. Denies history of malignancy. She smoked cigarettes for about 4 years back in her 25s when she was in graduate school but not since then. Denies unintentional weight loss. Reports having abdominal distention for several months. Also complaining of early satiety/loss of appetite. No other complaints.  ED Course:Afebrile. Not tachycardic. Tachypneic with respiratory rate in the 20s. Blood pressure elevated with systolic up to 211H. WBC 13.4, hemoglobin 15.8, hematocrit 45.4, platelet 473K.Sodium 131, potassium 2.9, chloride 93, bicarb 25, BUN 15, creatinine 0.7, glucose 125. Magnesium 2.2. BNP 70. SARS-CoV-2 PCR test and influenza panel both negative. Blood culture x2 pending. Chest x-ray showing a large left pleural effusion with subtotal atelectasis of the left lung and mediastinal shift left to right, little change since 04/28/2020. Underwent thoracentesis for large left pleural effusion in the ED and 1500 cc fluid removed. Repeat chest  x-ray showing interval decrease in size of a now small to moderate volume left pleural effusion; underlying infection, inflammation, pulmonary mass not excluded; trace right pleural effusion. Not hypoxic at rest but sats dropped to 90% with ambulation. She was given ceftriaxone and azithromycin. Also given oral potassium 40 mEq for hypokalemia. Admission requested for observation.  Assessment & Plan:   Principal Problem:   Pleural effusion Active Problems:   Essential hypertension   Acute hypoxemic respiratory failure (HCC)   Hyponatremia   Hypokalemia  #1 acute hypoxic respiratory failure secondary to large left pleural effusion-chest x-ray showed large left pleural effusion with subtotal atelectasis of the left lung and mediastinal shift to the right little change since 04/28/2020.  Patient underwent thoracentesis 05/04/2020 and 1500 cc fluid was removed   Repeat chest x-ray on 12/21 showed decrease in the effusion. Pleural fluid cytology was positive for metastatic adenocarcinoma and likely primary lung adenocarcinoma.  There was no growth on the pleural fluid. PCCM was consulted she will follow-up with Dr. Sharma Covert on discharge Oncology was consulted recommending outpatient follow-up for molecular testing and PET scan. Cardiothoracic surgery was consulted they did not have any further recommendations. IR was consulted for Pleurx catheter placement but since she just had thoracentesis and was tapped dry will need to wait for pleural fluid to recur in order to place Pleurx catheter safely.  Earliest would be on Monday. Antibiotics were stopped due to low procalcitonin. Repeat chest x-ray today 05/06/2020 pending    CT Scan ordered for further evaluation and showed "Moderate left and small right pleural effusions. The left pleural effusion demonstrates areas of conspicuous pleural thickening and small pleural nodularity. While this could reflect empyema, a malignant effusion with pleural  deposits would present similarly. Furthermore, there is heterogeneous enhancement of the adjacent atelectatic lung parenchyma including within the lingula, anterior segment left upper lobe and anterior basal segment of the left lower lobe. Could reflect underlying airspace disease or malignancy with additional airspace disease seen throughout aerated portions of the left lung. Asymmetric soft tissue edema across the left chest wall. No clear extension of the pleural or airspace processes in to the chest wall proper. Possibly reactive though should with exam findings and continued attention on follow-up imaging is recommended. Some questionable thickening of the rectosigmoid though may be related to underdistention though some mild perirectal fat stranding is present however and could suggest an underlying proctitis. Aortic Atherosclerosis     #2 mild hyponatremia sodium up to 134 from 131.  This is likely due to home thiazide.  Sodium normalized to 137.  #3 hypokalemia potassium 3.4 replete magnesium 2.1.  #4 history of essential hypertension she was on amlodipine prior to admission which has been stopped due to bilateral lower extremity edema currently she is on metoprolol continue the same.  #5 hypophosphatemia resolved  #6 early satiety and abdominal distention patient have CT of the abdomen to evaluate for any mass or ascites.  #7 hyperlipidemia continue statin  Estimated body mass index is 24.48 kg/m as calculated from the following:   Height as of this encounter: 5\' 4"  (1.626 m).   Weight as of this encounter: 64.7 kg.  DVT prophylaxis: Lovenox  code Status DNR  family Communication: None at bedside  disposition Plan:  Status is: Inpatient  Dispo: The patient is from: Home              Anticipated d/c is to: SNF              Anticipated d/c date is: > 3 days              Patient currently is not medically stable to d/c.   Consultants:   PCCM, interventional radiology thoracic  surgery and oncology  Procedures thoracentesis 04/01/2020 removed 1.5 L thoracentesis 04/04/2020 removed 1.5 L echo with normal ejection fraction.  Antimicrobials: None Subjective: Her saturation was 88% on room air as she was moving around in the room and doing things without oxygen.  She was put on 3 L of oxygen saturation 91%.  Objective: Vitals:   05/05/20 2056 05/06/20 0559 05/06/20 0638 05/06/20 0736  BP: 129/68 131/78  126/69  Pulse: 94 92    Resp: 20 17    Temp: 98.7 F (37.1 C) 97.8 F (36.6 C)  98 F (36.7 C)  TempSrc:    Oral  SpO2: 94% (!) 88% 91% 91%  Weight:      Height:       No intake or output data in the 24 hours ending 05/06/20 0944 Filed Weights   05/03/20 0512  Weight: 64.7 kg    Examination:  General exam: Appears calm and comfortable  Respiratory system: Decreased breath sounds at the bases to auscultation. Respiratory effort normal. Cardiovascular system: S1 & S2 heard, RRR. No JVD, murmurs, rubs, gallops or clicks. No pedal edema. Gastrointestinal system: Abdomen is distended, soft and nontender. No organomegaly or masses felt. Normal bowel sounds heard. Central nervous system: Alert and oriented. No focal neurological deficits. Extremities: 1+ nonpitting edema Skin: No rashes, lesions or ulcers Psychiatry: Judgement and insight appear normal. Mood & affect appropriate.     Data Reviewed: I have personally reviewed following labs and imaging studies  CBC:  Recent Labs  Lab 05/01/20 1632 05/02/20 0704 05/03/20 0213 05/04/20 0300 05/05/20 0314  WBC 13.4* 14.7* 11.5* 13.9* 11.5*  NEUTROABS  --   --  8.5* 10.3* 8.6*  HGB 15.8* 14.8 13.7 13.7 13.9  HCT 45.4 43.9 38.3 40.0 40.7  MCV 86.3 87.1 86.1 86.4 86.8  PLT 473* 377 340 346 940   Basic Metabolic Panel: Recent Labs  Lab 05/01/20 1632 05/02/20 0704 05/03/20 0213 05/04/20 0300 05/05/20 0314  NA 131* 131* 132* 134* 137  K 2.9* 4.1 3.7 3.7 3.4*  CL 93* 99 102 104 105  CO2 25 20*  22 23 23   GLUCOSE 125* 82 95 108* 106*  BUN 15 11 13 11  7*  CREATININE 0.77 0.66 0.70 0.79 0.69  CALCIUM 10.6* 9.2 9.2 9.3 9.2  MG 2.2  --   --  2.1 2.1  PHOS  --   --   --  2.0* 3.6   GFR: Estimated Creatinine Clearance: 44.4 mL/min (by C-G formula based on SCr of 0.69 mg/dL). Liver Function Tests: Recent Labs  Lab 05/01/20 1943 05/02/20 0704 05/03/20 0213 05/04/20 0300 05/05/20 0314  AST 40 26 22 22 19   ALT 39 29 23 25 24   ALKPHOS 61 48 43 46 45  BILITOT 1.2 1.0 0.9 0.7 0.7  PROT 6.8 5.1* 4.7* 5.0* 5.0*  ALBUMIN 3.7 2.8* 2.4* 2.5* 2.6*   No results for input(s): LIPASE, AMYLASE in the last 168 hours. No results for input(s): AMMONIA in the last 168 hours. Coagulation Profile: No results for input(s): INR, PROTIME in the last 168 hours. Cardiac Enzymes: No results for input(s): CKTOTAL, CKMB, CKMBINDEX, TROPONINI in the last 168 hours. BNP (last 3 results) No results for input(s): PROBNP in the last 8760 hours. HbA1C: No results for input(s): HGBA1C in the last 72 hours. CBG: No results for input(s): GLUCAP in the last 168 hours. Lipid Profile: No results for input(s): CHOL, HDL, LDLCALC, TRIG, CHOLHDL, LDLDIRECT in the last 72 hours. Thyroid Function Tests: No results for input(s): TSH, T4TOTAL, FREET4, T3FREE, THYROIDAB in the last 72 hours. Anemia Panel: No results for input(s): VITAMINB12, FOLATE, FERRITIN, TIBC, IRON, RETICCTPCT in the last 72 hours. Sepsis Labs: Recent Labs  Lab 05/02/20 0704  PROCALCITON <0.10    Recent Results (from the past 240 hour(s))  Resp Panel by RT-PCR (Flu A&B, Covid) Nasopharyngeal Swab     Status: None   Collection Time: 05/01/20  6:22 PM   Specimen: Nasopharyngeal Swab; Nasopharyngeal(NP) swabs in vial transport medium  Result Value Ref Range Status   SARS Coronavirus 2 by RT PCR NEGATIVE NEGATIVE Final    Comment: (NOTE) SARS-CoV-2 target nucleic acids are NOT DETECTED.  The SARS-CoV-2 RNA is generally detectable in  upper respiratory specimens during the acute phase of infection. The lowest concentration of SARS-CoV-2 viral copies this assay can detect is 138 copies/mL. A negative result does not preclude SARS-Cov-2 infection and should not be used as the sole basis for treatment or other patient management decisions. A negative result may occur with  improper specimen collection/handling, submission of specimen other than nasopharyngeal swab, presence of viral mutation(s) within the areas targeted by this assay, and inadequate number of viral copies(<138 copies/mL). A negative result must be combined with clinical observations, patient history, and epidemiological information. The expected result is Negative.  Fact Sheet for Patients:  EntrepreneurPulse.com.au  Fact Sheet for Healthcare Providers:  IncredibleEmployment.be  This test is no t yet approved or cleared by the [place] and  has been authorized for detection and/or diagnosis of SARS-CoV-2 by FDA under an Emergency Use Authorization (EUA). This EUA will remain  in effect (meaning this test can be used) for the duration of the COVID-19 declaration under Section 564(b)(1) of the Act, 21 U.S.C.section 360bbb-3(b)(1), unless the authorization is terminated  or revoked sooner.       Influenza A by PCR NEGATIVE NEGATIVE Final   Influenza B by PCR NEGATIVE NEGATIVE Final    Comment: (NOTE) The Xpert Xpress SARS-CoV-2/FLU/RSV plus assay is intended as an aid in the diagnosis of influenza from Nasopharyngeal swab specimens and should not be used as a sole basis for treatment. Nasal washings and aspirates are unacceptable for Xpert Xpress SARS-CoV-2/FLU/RSV testing.  Fact Sheet for Patients: EntrepreneurPulse.com.au  Fact Sheet for Healthcare Providers: IncredibleEmployment.be  This test is not yet approved or cleared by the [place] FDA and has been  authorized for detection and/or diagnosis of SARS-CoV-2 by FDA under an Emergency Use Authorization (EUA). This EUA will remain in effect (meaning this test can be used) for the duration of the COVID-19 declaration under Section 564(b)(1) of the Act, 21 U.S.C. section 360bbb-3(b)(1), unless the authorization is terminated or revoked.  Performed at [facility], [place] [address] [place], [place] [address]   Body fluid culture (includes gram stain)     Status: None   Collection Time: 05/01/20  8:38 PM   Specimen: Pleural Fluid  Result Value Ref Range Status   Specimen Description FLUID PLEURAL  Final   Special Requests NONE  Final   Gram Stain   Final    RARE WBC PRESENT, PREDOMINANTLY MONONUCLEAR NO ORGANISMS SEEN    Culture   Final    NO GROWTH Performed at [facility], [place] [address], [place], [place] [address]    Report Status 05/05/2020 FINAL  Final  Culture, blood (routine x 2)     Status: None (Preliminary result)   Collection Time: 05/01/20 11:05 PM   Specimen: BLOOD  Result Value Ref Range Status   Specimen Description BLOOD SITE NOT SPECIFIED  Final   Special Requests   Final    BOTTLES DRAWN AEROBIC AND ANAEROBIC Blood Culture adequate volume   Culture   Final    NO GROWTH 3 DAYS Performed at [facility], [place] [address], [place], [place] [address]    Report Status PENDING  Incomplete  Culture, blood (routine x 2)     Status: None (Preliminary result)   Collection Time: 05/02/20  2:52 PM   Specimen: BLOOD  Result Value Ref Range Status   Specimen Description BLOOD RIGHT ANTECUBITAL  Final   Special Requests   Final    BOTTLES DRAWN AEROBIC AND ANAEROBIC Blood Culture adequate volume   Culture   Final    NO GROWTH 3 DAYS Performed at [facility], [place] [address] [place], [place] [address]    Report Status PENDING  Incomplete  Body fluid culture     Status: None (Preliminary result)   Collection Time: 05/04/20  2:09 PM   Specimen:  Lung, Left; Pleural Fluid  Result Value Ref Range Status   Specimen Description PLEURAL FLUID  Final   Special Requests LEFT LUNG  Final   Gram Stain   Final    RARE WBC PRESENT, PREDOMINANTLY MONONUCLEAR NO ORGANISMS SEEN    Culture   Final    NO GROWTH 2 DAYS Performed at [facility], [place] [address]  [place], [place] [address]    Report Status PENDING  Incomplete  Acid Fast Smear (AFB)     Status: None   Collection Time: 05/04/20  2:09 PM   Specimen: Lung, Left; Pleural Fluid  Result Value Ref Range Status   AFB Specimen Processing Concentration  Final   Acid Fast Smear Negative  Final    Comment: (NOTE) Performed At: [facility] [place], [place] [id number] Rush Farmer MD NK:[id number]    Source (AFB) PLEURAL  Final    Comment: FLUID LEFT LUNG Performed at [facility], [place] [address] [place], [place] [address]          Radiology Studies: DG Chest 1 View  Result Date: 05/06/2020 CLINICAL DATA:  Shortness of breath. EXAM: CHEST  1 VIEW COMPARISON:  05/05/2020 FINDINGS: 0926 hours. Interval progression of left parahilar and retrocardiac basilar collapse/consolidative opacity. Left pleural effusion noted. There is probable some minimal atelectasis at the right base with tiny right pleural effusion. IMPRESSION: 1. Interval progression of left parahilar and retrocardiac basilar collapse/consolidative opacity. 2. Left pleural effusion. 3. Similar appearance of minimal atelectasis/infiltrate at the right base with tiny right effusion. Electronically Signed   By: Misty Stanley M.D.   On: 05/06/2020 09:33   DG Chest 1 View  Result Date: 05/04/2020 CLINICAL DATA:  Post thoracentesis on the left. EXAM: CHEST  1 VIEW COMPARISON:  Radiographs earlier today and 05/03/2020. CT 05/02/2020. FINDINGS: 1416 hours. Interval decreased size of left pleural effusion following thoracentesis. There is improved aeration of the left lung with persistent  left perihilar opacity which may reflect a mass or infiltrate. No pneumothorax. The right lung remains clear. There is a stable small right pleural effusion. The heart size and mediastinal contours are stable. IMPRESSION: 1. Interval decreased size of left pleural effusion following thoracentesis with improved aeration of the left lung. No pneumothorax. 2. Persistent left perihilar opacity may reflect a mass or infiltrate. Electronically Signed   By: Richardean Sale M.D.   On: 05/04/2020 14:49   CT CHEST W CONTRAST  Result Date: 05/04/2020 CLINICAL DATA:  Pleural effusion, left thoracentesis, abnormal chest x-ray EXAM: CT CHEST WITH CONTRAST TECHNIQUE: Multidetector CT imaging of the chest was performed during intravenous contrast administration. CONTRAST:  63mL OMNIPAQUE IOHEXOL 300 MG/ML  SOLN COMPARISON:  05/02/2020 FINDINGS: Cardiovascular: The heart is unremarkable without pericardial effusion. There is normal caliber of the thoracic aorta with no aneurysm or dissection. Minimal atherosclerosis. No evidence of pulmonary embolus. There is extrinsic compression of the left upper lobe pulmonary artery. Mediastinum/Nodes: Subcarinal adenopathy measuring 14 mm in short axis. Thyroid, trachea, and esophagus are unremarkable. Lungs/Pleura: There is masslike consolidation of the left upper lobe abutting the mediastinum, measuring 4.5 x 4.1 x 4.6 cm (see image 61/3 and image 59/5). Medial extent of this mass invades the mediastinum at the level of the aortic or pulmonary window, reference image 57/3 and 59/5. Findings are consistent with underlying malignancy and postobstructive change. PET CT may better delineate the mass from the postobstructive change. Bronchoscopy may be useful for tissue diagnosis if recent thoracentesis was nondiagnostic for malignancy. There are small bilateral pleural effusions. On the left, there is a small loculated component in the anterior left hemithorax adjacent to the left upper lobe  mass described above. Scattered hypoventilatory changes are seen at the lung bases and within the lingula. Interstitial prominence within the left upper lobe could reflect lymphangitic spread of disease. Upper Abdomen: Complex left renal cysts are unchanged.  No acute upper abdominal process. Musculoskeletal: No acute or destructive bony lesions. Reconstructed images demonstrate no additional findings. IMPRESSION: 1. Left upper lobe masslike consolidation consistent with malignancy. A portion of the mass invades the mediastinum at the level of the aortic or pulmonary window. PET-CT may be useful to delineate the mass from the postobstructive change. 2. Prominence of the left upper lobe pulmonary interstitium, concerning for lymphangitic spread of disease. 3. A subcarinal lymphadenopathy worrisome for nodal metastasis. Again, PET-CT may be useful. 4. Small bilateral pleural effusions, partially loculated on the left. Decreased left effusion after recent thoracentesis. 5.  Aortic Atherosclerosis (ICD10-I70.0). Electronically Signed   By: Randa Ngo M.D.   On: 05/04/2020 23:09   CT ABDOMEN PELVIS W CONTRAST  Result Date: 05/05/2020 CLINICAL DATA:  Urologic cancer surveillance EXAM: CT ABDOMEN AND PELVIS WITH CONTRAST TECHNIQUE: Multidetector CT imaging of the abdomen and pelvis was performed using the standard protocol following bolus administration of intravenous contrast. CONTRAST:  22mL OMNIPAQUE IOHEXOL 300 MG/ML  SOLN COMPARISON:  CT chest, 05/04/2020, CT chest abdomen pelvis, 05/02/2020 FINDINGS: Lower chest: Small, left greater than right bilateral pleural effusions, similar to prior examination. Nodular thickening of the dependent left-sided parietal pleura (series 3, image 11). Hepatobiliary: Multiple subcentimeter fluid and low-attenuation lesions of the liver, not significant changed compared to prior examination, and most likely benign incidental cysts and/or hemangiomata. No gallstones, gallbladder  wall thickening, or biliary dilatation. Pancreas: There is an unchanged 1.1 cm fluid low-attenuation lesion of the pancreatic tail (series 3, image 26). No pancreatic ductal dilatation or surrounding inflammatory changes. Spleen: Normal in size without significant abnormality. Adrenals/Urinary Tract: Adrenal glands are unremarkable. There are multiple bilateral simple renal cysts, the largest in the midportion of the left kidney and inferior pole of the right kidney. Kidneys are otherwise normal, without renal calculi, solid lesion, or hydronephrosis. Bladder is unremarkable. Stomach/Bowel: Stomach is within normal limits. Appendix is not clearly visualized and may be diminutive or surgically absent. There is redemonstrated mild rectal wall thickening and minimal perirectal fat stranding (series 3, image 69). Vascular/Lymphatic: Aortic atherosclerosis. No enlarged abdominal or pelvic lymph nodes. Reproductive: Status post hysterectomy. Other: No abdominal wall hernia or abnormality. No abdominopelvic ascites. Musculoskeletal: No acute or significant osseous findings. IMPRESSION: 1. No definite evidence of malignancy or metastatic disease in the abdomen or pelvis. Examination is generally unchanged compared to recent CT dated 05/02/2020. 2. No evidence of urologic malignancy per ordering indication. Definitively benign bilateral renal cysts for which no further characterization or follow-up is specifically required. 3. Multiple subcentimeter low-attenuation lesions of the liver, generally too small to confidently characterize, although the largest are clearly simple cysts, remaining smaller lesions almost certainly incidental small cysts or hemangiomata. No findings suspicious for hepatic metastatic disease. 4. There is redemonstrated mild rectal wall thickening and minimal perirectal fat stranding, again suggesting nonspecific proctitis. Correlate with referable signs and symptoms if present. Synchronous rectal  malignancy would be difficult to strictly exclude. Consider digital and/or endoscopic examination. 5. There is an unchanged 1.1 cm fluid low-attenuation lesion of the pancreatic tail. 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 There is no pancreatic ductal dilatation. Given new diagnosis of advanced stage lung malignancy, recommend attention on follow-up with future clinically indicated oncology imaging. 6. Small, left greater than right bilateral pleural effusions, similar to prior examination. Nodular thickening of the dependent left-sided parietal pleura. 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 Aortic Atherosclerosis (ICD10-I70.0). Electronically Signed   By: Cristie Hem  Laqueta Carina M.D.   On: 05/05/2020 19:22   DG CHEST PORT 1 VIEW  Result Date: 05/05/2020 CLINICAL DATA:  SOB. Hx of valvular heart disease and HTN EXAM: PORTABLE CHEST 1 VIEW COMPARISON:  05/04/2020 FINDINGS: Normal cardiac silhouette. Dense streaky LEFT lobe opacity increased from prior. Obscuration of the heart border. Small LEFT effusion. RIGHT lung is clear. Lungs are hyperinflated. No pneumothorax IMPRESSION: LEFT lower lobe and lingular pneumonia and probable effusion. Electronically Signed   By: Suzy Bouchard M.D.   On: 05/05/2020 09:04   VAS [place] LOWER EXTREMITY VENOUS (DVT)  Result Date: 05/04/2020  Lower Venous DVT Study Indications: Dyspnea and fatigue x 10 days.  Risk Factors: Recent pleural effusion and thoracentesis w/ suspected malignancy. Performing Technologist: Rogelia Rohrer  Examination Guidelines: A complete evaluation includes B-mode imaging, spectral Doppler, color Doppler, and power Doppler as needed of all accessible portions of each vessel. Bilateral testing is considered an integral part of a complete examination. Limited examinations for reoccurring indications may be performed as noted. The reflux portion of the exam is performed with the patient in reverse Trendelenburg.   +---------+---------------+---------+-----------+----------+--------------+ RIGHT    CompressibilityPhasicitySpontaneityPropertiesThrombus Aging +---------+---------------+---------+-----------+----------+--------------+ CFV      Full           Yes      Yes                                 +---------+---------------+---------+-----------+----------+--------------+ SFJ      Full                                                        +---------+---------------+---------+-----------+----------+--------------+ FV Prox  Full           Yes      Yes                                 +---------+---------------+---------+-----------+----------+--------------+ FV Mid   Full           Yes      Yes                                 +---------+---------------+---------+-----------+----------+--------------+ FV DistalFull           Yes      Yes                                 +---------+---------------+---------+-----------+----------+--------------+ PFV      Full                                                        +---------+---------------+---------+-----------+----------+--------------+ POP      Full           Yes      Yes                                 +---------+---------------+---------+-----------+----------+--------------+  PTV      Full                                                        +---------+---------------+---------+-----------+----------+--------------+ PERO     Full                                                        +---------+---------------+---------+-----------+----------+--------------+   +---------+---------------+---------+-----------+----------+--------------+ LEFT     CompressibilityPhasicitySpontaneityPropertiesThrombus Aging +---------+---------------+---------+-----------+----------+--------------+ CFV      Full           Yes      Yes                                  +---------+---------------+---------+-----------+----------+--------------+ SFJ      Full                                                        +---------+---------------+---------+-----------+----------+--------------+ FV Prox  Full           Yes      Yes                                 +---------+---------------+---------+-----------+----------+--------------+ FV Mid   Full           Yes      Yes                                 +---------+---------------+---------+-----------+----------+--------------+ FV DistalFull           Yes      Yes                                 +---------+---------------+---------+-----------+----------+--------------+ PFV      Full                                                        +---------+---------------+---------+-----------+----------+--------------+ POP      Full           Yes      Yes                                 +---------+---------------+---------+-----------+----------+--------------+ PTV      Full                                                        +---------+---------------+---------+-----------+----------+--------------+  PERO     Full                                                        +---------+---------------+---------+-----------+----------+--------------+     Summary: RIGHT: - There is no evidence of deep vein thrombosis in the lower extremity.  - No cystic structure found in the popliteal fossa.  LEFT: - There is no evidence of deep vein thrombosis in the lower extremity.  - No cystic structure found in the popliteal fossa.  *See table(s) above for measurements and observations. Electronically signed by Monica Martinez MD on 05/04/2020 at 7:55:52 PM.    Final    IR THORACENTESIS ASP PLEURAL SPACE W/IMG GUIDE  Result Date: 05/04/2020 INDICATION: Patient with history of acute hypoxic respiratory failure secondary to recurrent left pleural effusion. Request is made for diagnostic and therapeutic  left thoracentesis. EXAM: ULTRASOUND GUIDED DIAGNOSTIC AND THERAPEUTIC THORACENTESIS MEDICATIONS: 10 mL 2% lidocaine COMPLICATIONS: None immediate. PROCEDURE: An ultrasound guided thoracentesis was thoroughly discussed with the patient and questions answered. The benefits, risks, alternatives and complications were also discussed. The patient understands and wishes to proceed with the procedure. Written consent was obtained. Ultrasound was performed to localize and mark an adequate pocket of fluid in the left chest. The area was then prepped and draped in the normal sterile fashion. 1% Lidocaine was used for local anesthesia. Under ultrasound guidance a 6 Fr Safe-T-Centesis catheter was introduced. Thoracentesis was performed. The catheter was removed and a dressing applied. FINDINGS: A total of approximately 1.55 L of hazy amber fluid was removed. Samples were sent to the laboratory as requested by the clinical team. IMPRESSION: Successful ultrasound guided left thoracentesis yielding 1.55 L of pleural fluid. Read by: Earley Abide, PA-C Electronically Signed   By: Markus Daft M.D.   On: 05/04/2020 15:12        Scheduled Meds: . atorvastatin  20 mg Oral QHS  . furosemide  40 mg Intravenous Once  . metoprolol succinate  50 mg Oral Daily  . pantoprazole  40 mg Oral BID AC   Continuous Infusions:   LOS: 4 days   Georgette Shell, MD  05/06/2020, 9:44 AM

## 2020-05-07 DIAGNOSIS — J90 Pleural effusion, not elsewhere classified: Secondary | ICD-10-CM | POA: Diagnosis not present

## 2020-05-07 LAB — BODY FLUID CULTURE: Culture: NO GROWTH

## 2020-05-07 LAB — COMPREHENSIVE METABOLIC PANEL
ALT: 22 U/L (ref 0–44)
AST: 20 U/L (ref 15–41)
Albumin: 2.4 g/dL — ABNORMAL LOW (ref 3.5–5.0)
Alkaline Phosphatase: 48 U/L (ref 38–126)
Anion gap: 8 (ref 5–15)
BUN: 9 mg/dL (ref 8–23)
CO2: 25 mmol/L (ref 22–32)
Calcium: 9.7 mg/dL (ref 8.9–10.3)
Chloride: 102 mmol/L (ref 98–111)
Creatinine, Ser: 0.9 mg/dL (ref 0.44–1.00)
GFR, Estimated: >60 mL/min (ref >60)
Glucose, Bld: 118 mg/dL — ABNORMAL HIGH (ref 70–99)
Potassium: 3.6 mmol/L (ref 3.5–5.1)
Sodium: 135 mmol/L (ref 135–145)
Total Bilirubin: 0.8 mg/dL (ref 0.3–1.2)
Total Protein: 5 g/dL — ABNORMAL LOW (ref 6.5–8.1)

## 2020-05-07 LAB — CBC
HCT: 39.2 % (ref 36.0–46.0)
Hemoglobin: 13.9 g/dL (ref 12.0–15.0)
MCH: 30.5 pg (ref 26.0–34.0)
MCHC: 35.5 g/dL (ref 30.0–36.0)
MCV: 86.2 fL (ref 80.0–100.0)
Platelets: 276 10*3/uL (ref 150–400)
RBC: 4.55 MIL/uL (ref 3.87–5.11)
RDW: 15.3 % (ref 11.5–15.5)
WBC: 13.3 10*3/uL — ABNORMAL HIGH (ref 4.0–10.5)
nRBC: 0 % (ref 0.0–0.2)

## 2020-05-07 LAB — CULTURE, BLOOD (ROUTINE X 2)
Culture: NO GROWTH
Culture: NO GROWTH
Special Requests: ADEQUATE
Special Requests: ADEQUATE

## 2020-05-07 NOTE — Progress Notes (Addendum)
PROGRESS NOTE    Shelley Flores  QXI:[id number] DOB: May 06, 1933 DOA: 05/01/2020 PCP: Cari Caraway, MD    Brief Narrative: Shelley Klawitter Forresteris a 86 y.o.femalewith medical history significant ofhypertension, hyperlipidemia, mild valvular heart disease presenting with complaints of shortness of breath.Patient states she has been coughing since the end of October and was seen by her doctor who thought the cough was related to her having acid reflux so she was started on Prilosec but her cough is persisted. For the past 10 days she has had dyspnea on exertion and fatigue. She had an x-ray done on 12/17 which revealed a large left pleural effusion. She was supposed to get IR thoracentesis but it has not been scheduled yet. Since her dyspnea was worsening she came into the ED today to be evaluated. Denies fevers and states she has been fully vaccinated against Covid. Denies chest pain. Denies prior history of pleural effusion/thoracentesis. Denies history of malignancy. She smoked cigarettes for about 4 years back in her 54s when she was in graduate school but not since then. Denies unintentional weight loss. Reports having abdominal distention for several months. Also complaining of early satiety/loss of appetite. No other complaints.  ED Course:Afebrile. Not tachycardic. Tachypneic with respiratory rate in the 20s. Blood pressure elevated with systolic up to 034J. WBC 13.4, hemoglobin 15.8, hematocrit 45.4, platelet 473K.Sodium 131, potassium 2.9, chloride 93, bicarb 25, BUN 15, creatinine 0.7, glucose 125. Magnesium 2.2. BNP 70. SARS-CoV-2 PCR test and influenza panel both negative. Blood culture x2 pending. Chest x-ray showing a large left pleural effusion with subtotal atelectasis of the left lung and mediastinal shift left to right, little change since 04/28/2020. Underwent thoracentesis for large left pleural effusion in the ED and 1500 cc fluid removed. Repeat chest  x-ray showing interval decrease in size of a now small to moderate volume left pleural effusion; underlying infection, inflammation, pulmonary mass not excluded; trace right pleural effusion. Not hypoxic at rest but sats dropped to 90% with ambulation. She was given ceftriaxone and azithromycin. Also given oral potassium 40 mEq for hypokalemia. Admission requested for observation.  Assessment & Plan:   Principal Problem:   Pleural effusion Active Problems:   Essential hypertension   Acute hypoxemic respiratory failure (HCC)   Hyponatremia   Hypokalemia  #1 acute hypoxic respiratory failure secondary to large left pleural effusion-chest x-ray showed large left pleural effusion with subtotal atelectasis of the left lung and mediastinal shift to the right little change since 04/28/2020.  Patient underwent thoracentesis 05/04/2020 and 1500 cc fluid was removed   Repeat chest x-ray on 12/21 showed decrease in the effusion. Pleural fluid cytology was positive for metastatic adenocarcinoma and likely primary lung adenocarcinoma.  There was no growth on the pleural fluid. PCCM was consulted she will follow-up with Dr. Valeta Harms  on discharge Oncology was consulted recommending outpatient follow-up for molecular testing and PET scan. Cardiothoracic surgery was consulted they did not have any further recommendations. IR was consulted for Pleurx catheter placement but since she just had thoracentesis and was tapped dry will need to wait for pleural fluid to recur in order to place Pleurx catheter safely.  Earliest would be on Monday. Antibiotics were stopped due to low procalcitonin. Repeat chest x-ray 05/06/2020-Interval progression of left parahilar and retrocardiac basilar collapse/consolidative opacity.  Left pleural effusion.  Similar appearance of minimal atelectasis/infiltrate at the right   base with tiny right effusion.  Patient to have Pleurx catheter placed by interventional radiology  hopefully tomorrow.  CT Scan ordered for further evaluation and showed "Moderate left and small right pleural effusions. The left pleural effusion demonstrates areas of conspicuous pleural thickening and small pleural nodularity. While this could reflect empyema, a malignant effusion with pleural deposits would present similarly. Furthermore, there is heterogeneous enhancement of the adjacent atelectatic lung parenchyma including within the lingula, anterior segment left upper lobe and anterior basal segment of the left lower lobe. Could reflect underlying airspace disease or malignancy with additional airspace disease seen throughout aerated portions of the left lung. Asymmetric soft tissue edema across the left chest wall. No clear extension of the pleural or airspace processes in to the chest wall proper. Possibly reactive though should with exam findings and continued attention on follow-up imaging is recommended. Some questionable thickening of the rectosigmoid though may be related to underdistention though some mild perirectal fat stranding is present however and could suggest an underlying proctitis. Aortic Atherosclerosis   #2 mild hyponatremia sodium up to 134 from 131.  This is likely due to home thiazide.  Sodium normalized to 137.  #3 hypokalemia potassium 3.4 replete magnesium 2.1.  #4 history of essential hypertension she was on amlodipine prior to admission which has been stopped due to bilateral lower extremity edema currently she is on metoprolol continue the same.  #5 hypophosphatemia resolved  #6 early satiety and abdominal distention patient have CT of the abdomen to evaluate for any mass or ascites. CT ABD 12/24-No definite evidence of malignancy or metastatic disease in the abdomen or pelvis. Examination is generally unchanged compared to recent CT dated 05/02/2020. No evidence of urologic malignancy per ordering indication. Definitively benign bilateral renal cysts for  which no further characterization or follow-up is specifically required.  Multiple subcentimeter low-attenuation lesions of the liver, generally too small to confidently characterize, although the largest are clearly simple cysts, remaining smaller lesions almost certainly incidental small cysts or hemangiomata. No findings suspicious for hepatic metastatic disease.  There is redemonstrated mild rectal wall thickening and minimal perirectal fat stranding, again suggesting nonspecific proctitis. Correlate with referable signs and symptoms if present. Synchronous rectal malignancy would be difficult to strictly exclude. Consider digital and/or endoscopic examination.  There is an unchanged 1.1 cm fluid low-attenuation lesion of the pancreatic tail. 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 There is no pancreatic ductal dilatation. Given new diagnosis of advanced stage lung malignancy, recommend attention on follow-up with future clinically indicated oncology imaging. Small, left greater than right bilateral pleural effusions, similar to prior examination. Nodular thickening of the dependent left-sided parietal pleura. 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   Weight as of this encounter: 64.7 kg.  DVT prophylaxis: Lovenox  code Status DNR  family Communication: None at bedside  disposition Plan:  Status is: Inpatient  Dispo: The patient is from: Home              Anticipated d/c is to: SNF              Anticipated d/c date is: > 3 days              Patient currently is not medically stable to d/c.   Consultants:   PCCM, interventional radiology thoracic surgery and oncology  Procedures thoracentesis 04/01/2020 removed 1.5 L thoracentesis 04/04/2020 removed 1.5 L echo with normal  ejection fraction.  Antimicrobials: None Subjective:  She has no new c/o  94% 2L She is aware she has lung cancer Objective: Vitals:   05/06/20 0736 05/06/20 1538 05/06/20 2110 05/07/20 0955  BP: 126/69 (!) 154/79 (!) 148/81 135/64  Pulse:  (!) 106 97   Resp:  20 20   Temp: 98 F (36.7 C) 99.1 F (37.3 C) 98.3 F (36.8 C) (!) 97.4 F (36.3 C)  TempSrc: Oral Oral Oral Oral  SpO2: 91% 90% 92% 94%  Weight:      Height:       No intake or output data in the 24 hours ending 05/07/20 1033 Filed Weights   05/03/20 0512  Weight: 64.7 kg    Examination:  General exam: Appears calm and comfortable  Respiratory system: Decreased breath sounds at the bases to auscultation. Respiratory effort normal. Cardiovascular system: S1 & S2 heard, RRR. No JVD, murmurs, rubs, gallops or clicks. No pedal edema. Gastrointestinal system: Abdomen is distended, soft and nontender. No organomegaly or masses felt. Normal bowel sounds heard. Central nervous system: Alert and oriented. No focal neurological deficits. Extremities: 1+ nonpitting edema Skin: No rashes, lesions or ulcers Psychiatry: Judgement and insight appear normal. Mood & affect appropriate.     Data Reviewed: I have personally reviewed following labs and imaging studies  CBC: Recent Labs  Lab 05/02/20 0704 05/03/20 0213 05/04/20 0300 05/05/20 0314 05/07/20 0144  WBC 14.7* 11.5* 13.9* 11.5* 13.3*  NEUTROABS  --  8.5* 10.3* 8.6*  --   HGB 14.8 13.7 13.7 13.9 13.9  HCT 43.9 38.3 40.0 40.7 39.2  MCV 87.1 86.1 86.4 86.8 86.2  PLT 377 340 346 349 726   Basic Metabolic Panel: Recent Labs  Lab 05/01/20 1632 05/02/20 0704 05/03/20 0213 05/04/20 0300 05/05/20 0314 05/07/20 0144  NA 131* 131* 132* 134* 137 135  K 2.9* 4.1 3.7 3.7 3.4* 3.6  CL 93* 99 102 104 105 102  CO2 25 20* 22 23 23 25   GLUCOSE 125* 82 95 108* 106* 118*  BUN 15 11 13 11  7* 9  CREATININE 0.77 0.66 0.70 0.79 0.69 0.90  CALCIUM 10.6* 9.2 9.2 9.3 9.2  9.7  MG 2.2  --   --  2.1 2.1  --   PHOS  --   --   --  2.0* 3.6  --    GFR: Estimated Creatinine Clearance: 39.5 mL/min (by C-G formula based on SCr of 0.9 mg/dL). Liver Function Tests: Recent Labs  Lab 05/02/20 0704 05/03/20 0213 05/04/20 0300 05/05/20 0314 05/07/20 0144  AST 26 22 22 19 20   ALT 29 23 25 24 22   ALKPHOS 48 43 46 45 48  BILITOT 1.0 0.9 0.7 0.7 0.8  PROT 5.1* 4.7* 5.0* 5.0* 5.0*  ALBUMIN 2.8* 2.4* 2.5* 2.6* 2.4*   No results for input(s): LIPASE, AMYLASE in the last 168 hours. No results for input(s): AMMONIA in the last 168 hours. Coagulation Profile: No results for input(s): INR, PROTIME in the last 168 hours. Cardiac Enzymes: No results for input(s): CKTOTAL, CKMB, CKMBINDEX, TROPONINI in the last 168 hours. BNP (last 3 results) No results for input(s): PROBNP in the last 8760 hours. HbA1C: No results for input(s): HGBA1C in the last 72 hours. CBG: No results for input(s): GLUCAP in the last 168 hours. Lipid Profile: No results for input(s): CHOL, HDL, LDLCALC, TRIG, CHOLHDL, LDLDIRECT in the last 72 hours. Thyroid Function Tests: No results for input(s): TSH, T4TOTAL, FREET4, T3FREE, THYROIDAB in the last 72  hours. Anemia Panel: No results for input(s): VITAMINB12, FOLATE, FERRITIN, TIBC, IRON, RETICCTPCT in the last 72 hours. Sepsis Labs: Recent Labs  Lab 05/02/20 0704  PROCALCITON <0.10    Recent Results (from the past 240 hour(s))  Resp Panel by RT-PCR (Flu A&B, Covid) Nasopharyngeal Swab     Status: None   Collection Time: 05/01/20  6:22 PM   Specimen: Nasopharyngeal Swab; Nasopharyngeal(NP) swabs in vial transport medium  Result Value Ref Range Status   SARS Coronavirus 2 by RT PCR NEGATIVE NEGATIVE Final    Comment: (NOTE) SARS-CoV-2 target nucleic acids are NOT DETECTED.  The SARS-CoV-2 RNA is generally detectable in upper respiratory specimens during the acute phase of infection. The lowest concentration of SARS-CoV-2 viral copies  this assay can detect is 138 copies/mL. A negative result does not preclude SARS-Cov-2 infection and should not be used as the sole basis for treatment or other patient management decisions. A negative result may occur with  improper specimen collection/handling, submission of specimen other than nasopharyngeal swab, presence of viral mutation(s) within the areas targeted by this assay, and inadequate number of viral copies(<138 copies/mL). A negative result must be combined with clinical observations, patient history, and epidemiological information. The expected result is Negative.  Fact Sheet for Patients:  EntrepreneurPulse.com.au  Fact Sheet for Healthcare Providers:  IncredibleEmployment.be  This test is no t yet approved or cleared by the [place] FDA and  has been authorized for detection and/or diagnosis of SARS-CoV-2 by FDA under an Emergency Use Authorization (EUA). This EUA will remain  in effect (meaning this test can be used) for the duration of the COVID-19 declaration under Section 564(b)(1) of the Act, 21 U.S.C.section 360bbb-3(b)(1), unless the authorization is terminated  or revoked sooner.       Influenza A by PCR NEGATIVE NEGATIVE Final   Influenza B by PCR NEGATIVE NEGATIVE Final    Comment: (NOTE) The Xpert Xpress SARS-CoV-2/FLU/RSV plus assay is intended as an aid in the diagnosis of influenza from Nasopharyngeal swab specimens and should not be used as a sole basis for treatment. Nasal washings and aspirates are unacceptable for Xpert Xpress SARS-CoV-2/FLU/RSV testing.  Fact Sheet for Patients: EntrepreneurPulse.com.au  Fact Sheet for Healthcare Providers: IncredibleEmployment.be  This test is not yet approved or cleared by the [place] FDA and has been authorized for detection and/or diagnosis of SARS-CoV-2 by FDA under an Emergency Use Authorization (EUA). This EUA  will remain in effect (meaning this test can be used) for the duration of the COVID-19 declaration under Section 564(b)(1) of the Act, 21 U.S.C. section 360bbb-3(b)(1), unless the authorization is terminated or revoked.  Performed at [facility], [place] [address] [place], [place] [address]   Body fluid culture (includes gram stain)     Status: None   Collection Time: 05/01/20  8:38 PM   Specimen: Pleural Fluid  Result Value Ref Range Status   Specimen Description FLUID PLEURAL  Final   Special Requests NONE  Final   Gram Stain   Final    RARE WBC PRESENT, PREDOMINANTLY MONONUCLEAR NO ORGANISMS SEEN    Culture   Final    NO GROWTH Performed at [facility], [place] [address], [place], [place] [address]    Report Status 05/05/2020 FINAL  Final  Culture, blood (routine x 2)     Status: None (Preliminary result)   Collection Time: 05/01/20 11:05 PM   Specimen: BLOOD  Result Value Ref Range Status   Specimen Description  BLOOD SITE NOT SPECIFIED  Final   Special Requests   Final    BOTTLES DRAWN AEROBIC AND ANAEROBIC Blood Culture adequate volume   Culture   Final    NO GROWTH 4 DAYS Performed at [facility], [address] [place], [place] [address]    Report Status PENDING  Incomplete  Culture, blood (routine x 2)     Status: None (Preliminary result)   Collection Time: 05/02/20  2:52 PM   Specimen: BLOOD  Result Value Ref Range Status   Specimen Description BLOOD RIGHT ANTECUBITAL  Final   Special Requests   Final    BOTTLES DRAWN AEROBIC AND ANAEROBIC Blood Culture adequate volume   Culture   Final    NO GROWTH 4 DAYS Performed at [facility], [place] [address], [place], [place] [address]    Report Status PENDING  Incomplete  Body fluid culture     Status: None   Collection Time: 05/04/20  2:09 PM   Specimen: Lung, Left; Pleural Fluid  Result Value Ref Range Status   Specimen Description PLEURAL FLUID  Final   Special Requests LEFT LUNG  Final    Gram Stain   Final    RARE WBC PRESENT, PREDOMINANTLY MONONUCLEAR NO ORGANISMS SEEN    Culture   Final    NO GROWTH Performed at [facility], [place] [address], [place], [place] [address]    Report Status 05/07/2020 FINAL  Final  Acid Fast Smear (AFB)     Status: None   Collection Time: 05/04/20  2:09 PM   Specimen: Lung, Left; Pleural Fluid  Result Value Ref Range Status   AFB Specimen Processing Concentration  Final   Acid Fast Smear Negative  Final    Comment: (NOTE) Performed At: [facility] [place], [place] [id number] Rush Farmer MD UE:[id number]    Source (AFB) PLEURAL  Final    Comment: FLUID LEFT LUNG Performed at [facility], [place] [address], [place], [place] [address]          Radiology Studies: DG Chest 1 View  Result Date: 05/06/2020 CLINICAL DATA:  Shortness of breath. EXAM: CHEST  1 VIEW COMPARISON:  05/05/2020 FINDINGS: 0926 hours. Interval progression of left parahilar and retrocardiac basilar collapse/consolidative opacity. Left pleural effusion noted. There is probable some minimal atelectasis at the right base with tiny right pleural effusion. IMPRESSION: 1. Interval progression of left parahilar and retrocardiac basilar collapse/consolidative opacity. 2. Left pleural effusion. 3. Similar appearance of minimal atelectasis/infiltrate at the right base with tiny right effusion. Electronically Signed   By: Misty Stanley M.D.   On: 05/06/2020 09:33   CT ABDOMEN PELVIS W CONTRAST  Result Date: 05/05/2020 CLINICAL DATA:  Urologic cancer surveillance EXAM: CT ABDOMEN AND PELVIS WITH CONTRAST TECHNIQUE: Multidetector CT imaging of the abdomen and pelvis was performed using the standard protocol following bolus administration of intravenous contrast. CONTRAST:  85mL OMNIPAQUE IOHEXOL 300 MG/ML  SOLN COMPARISON:  CT chest, 05/04/2020, CT chest abdomen pelvis, 05/02/2020 FINDINGS: Lower chest: Small, left greater than right  bilateral pleural effusions, similar to prior examination. Nodular thickening of the dependent left-sided parietal pleura (series 3, image 11). Hepatobiliary: Multiple subcentimeter fluid and low-attenuation lesions of the liver, not significant changed compared to prior examination, and most likely benign incidental cysts and/or hemangiomata. No gallstones, gallbladder wall thickening, or biliary dilatation. Pancreas: There is an unchanged 1.1 cm fluid low-attenuation lesion of the pancreatic tail (series 3, image  26). No pancreatic ductal dilatation or surrounding inflammatory changes. Spleen: Normal in size without significant abnormality. Adrenals/Urinary Tract: Adrenal glands are unremarkable. There are multiple bilateral simple renal cysts, the largest in the midportion of the left kidney and inferior pole of the right kidney. Kidneys are otherwise normal, without renal calculi, solid lesion, or hydronephrosis. Bladder is unremarkable. Stomach/Bowel: Stomach is within normal limits. Appendix is not clearly visualized and may be diminutive or surgically absent. There is redemonstrated mild rectal wall thickening and minimal perirectal fat stranding (series 3, image 69). Vascular/Lymphatic: Aortic atherosclerosis. No enlarged abdominal or pelvic lymph nodes. Reproductive: Status post hysterectomy. Other: No abdominal wall hernia or abnormality. No abdominopelvic ascites. Musculoskeletal: No acute or significant osseous findings. IMPRESSION: 1. No definite evidence of malignancy or metastatic disease in the abdomen or pelvis. Examination is generally unchanged compared to recent CT dated 05/02/2020. 2. No evidence of urologic malignancy per ordering indication. Definitively benign bilateral renal cysts for which no further characterization or follow-up is specifically required. 3. Multiple subcentimeter low-attenuation lesions of the liver, generally too small to confidently characterize, although the largest are  clearly simple cysts, remaining smaller lesions almost certainly incidental small cysts or hemangiomata. No findings suspicious for hepatic metastatic disease. 4. There is redemonstrated mild rectal wall thickening and minimal perirectal fat stranding, again suggesting nonspecific proctitis. Correlate with referable signs and symptoms if present. Synchronous rectal malignancy would be difficult to strictly exclude. Consider digital and/or endoscopic examination. 5. There is an unchanged 1.1 cm fluid low-attenuation lesion of the pancreatic tail. 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 There is no pancreatic ductal dilatation. Given new diagnosis of advanced stage lung malignancy, recommend attention on follow-up with future clinically indicated oncology imaging. 6. Small, left greater than right bilateral pleural effusions, similar to prior examination. Nodular thickening of the dependent left-sided parietal pleura. 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 Aortic Atherosclerosis (ICD10-I70.0). Electronically Signed   By: Eddie Candle M.D.   On: 05/05/2020 19:22        Scheduled Meds: . atorvastatin  20 mg Oral QHS  . furosemide  40 mg Intravenous Once  . metoprolol succinate  50 mg Oral Daily  . pantoprazole  40 mg Oral BID AC   Continuous Infusions:   LOS: 5 days   Georgette Shell, MD  05/07/2020, 10:33 AM

## 2020-05-08 DIAGNOSIS — J90 Pleural effusion, not elsewhere classified: Secondary | ICD-10-CM | POA: Diagnosis not present

## 2020-05-08 LAB — CBC
HCT: 44.2 % (ref 36.0–46.0)
Hemoglobin: 15.4 g/dL — ABNORMAL HIGH (ref 12.0–15.0)
MCH: 30.4 pg (ref 26.0–34.0)
MCHC: 34.8 g/dL (ref 30.0–36.0)
MCV: 87.2 fL (ref 80.0–100.0)
Platelets: 283 10*3/uL (ref 150–400)
RBC: 5.07 MIL/uL (ref 3.87–5.11)
RDW: 15.1 % (ref 11.5–15.5)
WBC: 12.7 10*3/uL — ABNORMAL HIGH (ref 4.0–10.5)
nRBC: 0 % (ref 0.0–0.2)

## 2020-05-08 LAB — CYTOLOGY - NON PAP

## 2020-05-08 MED ORDER — POTASSIUM CHLORIDE CRYS ER 20 MEQ PO TBCR
40.0000 meq | EXTENDED_RELEASE_TABLET | Freq: Once | ORAL | Status: AC
  Administered 2020-05-08: 40 meq via ORAL
  Filled 2020-05-08: qty 2

## 2020-05-08 NOTE — TOC Progression Note (Signed)
Transition of Care [facility]) - Progression Note    Patient Details  Name: Shelley Flores MRN: [id number] Date of Birth: 1933-02-08  Transition of Care [facility]) CM/SW [place], RN Phone Number: [phone]  05/08/2020, 11:24 AM  Clinical Narrative:    CM consulted for patient from [facility] that now requires SNF placement. Patient will need to have pleurx catheter placed and will need to know definate d/c plan. CM is unable to determine SNF avaliability. Message has been left for the social worker [place]. Will await return call.         Expected Discharge Plan and Services                                                 Social Determinants of Health (SDOH) Interventions    Readmission Risk Interventions No flowsheet data found.

## 2020-05-08 NOTE — Telephone Encounter (Signed)
lmtcb for pt.  

## 2020-05-08 NOTE — Plan of Care (Signed)
  Problem: Education: Goal: Knowledge of General Education information will improve Description Including pain rating scale, medication(s)/side effects and non-pharmacologic comfort measures Outcome: Progressing   

## 2020-05-08 NOTE — Progress Notes (Signed)
PROGRESS NOTE    Shelley Flores  ZOX:[id number] DOB: [birth year] DOA: 05/01/2020 PCP: Cari Caraway, MD    Brief Narrative: Shelley Ciszek Forresteris a 86 y.o.femalewith medical history significant ofhypertension, hyperlipidemia, mild valvular heart disease presenting with complaints of shortness of breath.Patient states she has been coughing since the end of October and was seen by her doctor who thought the cough was related to her having acid reflux so she was started on Prilosec but her cough is persisted. For the past 10 days she has had dyspnea on exertion and fatigue. She had an x-ray done on 12/17 which revealed a large left pleural effusion. She was supposed to get IR thoracentesis but it has not been scheduled yet. Since her dyspnea was worsening she came into the ED today to be evaluated. Denies fevers and states she has been fully vaccinated against Covid. Denies chest pain. Denies prior history of pleural effusion/thoracentesis. Denies history of malignancy. She smoked cigarettes for about 4 years back in her 75s when she was in graduate school but not since then. Denies unintentional weight loss. Reports having abdominal distention for several months. Also complaining of early satiety/loss of appetite. No other complaints.  ED Course:Afebrile. Not tachycardic. Tachypneic with respiratory rate in the 20s. Blood pressure elevated with systolic up to 811B. WBC 13.4, hemoglobin 15.8, hematocrit 45.4, platelet 473K.Sodium 131, potassium 2.9, chloride 93, bicarb 25, BUN 15, creatinine 0.7, glucose 125. Magnesium 2.2. BNP 70. SARS-CoV-2 PCR test and influenza panel both negative. Blood culture x2 pending. Chest x-ray showing a large left pleural effusion with subtotal atelectasis of the left lung and mediastinal shift left to right, little change since 04/28/2020. Underwent thoracentesis for large left pleural effusion in the ED and 1500 cc fluid removed. Repeat chest  x-ray showing interval decrease in size of a now small to moderate volume left pleural effusion; underlying infection, inflammation, pulmonary mass not excluded; trace right pleural effusion. Not hypoxic at rest but sats dropped to 90% with ambulation. She was given ceftriaxone and azithromycin. Also given oral potassium 40 mEq for hypokalemia. Admission requested for observation.  Assessment & Plan:   Principal Problem:   Pleural effusion Active Problems:   Essential hypertension   Acute hypoxemic respiratory failure (HCC)   Hyponatremia   Hypokalemia  #1 acute hypoxic respiratory failure secondary to large left pleural effusion-chest x-ray showed large left pleural effusion with subtotal atelectasis of the left lung and mediastinal shift to the right little change since 04/28/2020.  Patient underwent left thoracentesis 05/04/2020 and 1500 cc fluid was removed   Repeat chest x-ray on 12/21 showed decrease in the effusion. Pleural fluid cytology was positive for metastatic adenocarcinoma and likely primary lung adenocarcinoma.  There was no growth on the pleural fluid. PCCM was consulted, recommending outpatient follow-up.  She will follow-up with Dr. Valeta Harms  on discharge Oncology was consulted recommending outpatient follow-up for molecular testing and PET scan. Cardiothoracic surgery was consulted they did not have any further recommendations. IR was consulted for Pleurx catheter placement but since she just had thoracentesis and was tapped dry will need to wait for pleural fluid to recur in order to place Pleurx catheter safely.  Discussed with IR today.  They will consider a Pleurx catheter placement once we have all the discharge plans done in terms of going to SNF having all the supplies needed for the drainage at the SNF and nursing at the nursing facility to do drainage.  [facility]  Case manager  has a left message with the facility and waiting for return call.  Please  call IR once you have the discharge plans in place so they can place a Pleurx catheter just prior to discharge.  Discussed with IR today. Antibiotics were stopped due to low procalcitonin. Repeat chest x-ray 05/06/2020-Interval progression of left parahilar and retrocardiac basilar collapse/consolidative opacity.  Left pleural effusion similar appearance of minimal atelectasis and infiltrates at the right base with tiny right effusion.    CT Scan ordered for further evaluation and showed "Moderate left and small right pleural effusions. The left pleural effusion demonstrates areas of conspicuous pleural thickening and small pleural nodularity. While this could reflect empyema, a malignant effusion with pleural deposits would present similarly. Furthermore, there is heterogeneous enhancement of the adjacent atelectatic lung parenchyma including within the lingula, anterior segment left upper lobe and anterior basal segment of the left lower lobe. Could reflect underlying airspace disease or malignancy with additional airspace disease seen throughout aerated portions of the left lung. Asymmetric soft tissue edema across the left chest wall. No clear extension of the pleural or airspace processes in to the chest wall proper. Possibly reactive though should with exam findings and continued attention on follow-up imaging is recommended. Some questionable thickening of the rectosigmoid though may be related to underdistention though some mild perirectal fat stranding is present however and could suggest an underlying proctitis. Aortic Atherosclerosis   #2 mild hyponatremia sodium up to 134 from 131.  This is likely due to home thiazide.  Sodium normalized to 137.  #3 hypokalemia potassium 3.4 replete magnesium 2.1.  #4 history of essential hypertension she was on amlodipine prior to admission which has been stopped due to bilateral lower extremity edema currently she is on metoprolol continue the same.  #5  hypophosphatemia resolved  #6 early satiety and abdominal distention -patient have CT of the abdomen to evaluate for any mass or ascites. CT ABD 12/24-No definite evidence of malignancy or metastatic disease in the abdomen or pelvis. No evidence of urologic malignancy per ordering indication. Definitively benign bilateral renal cysts for which no further characterization or follow-up is specifically required.  Multiple subcentimeter low-attenuation lesions of the liver, generally too small to confidently characterize, although the largest are clearly simple cysts, remaining smaller lesions almost certainly incidental small cysts or hemangiomata. No findings suspicious for hepatic metastatic disease.  There is redemonstrated mild rectal wall thickening and minimal perirectal fat stranding, again suggesting nonspecific proctitis. Correlate with referable signs and symptoms if present. Synchronous rectal malignancy would be difficult to strictly exclude. Consider digital and/or endoscopic examination.  There is an unchanged 1.1 cm fluid low-attenuation lesion of the pancreatic tail. 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 There is no pancreatic ductal dilatation. Given new diagnosis of advanced stage lung malignancy, recommend attention on follow-up with future clinically indicated oncology imaging. Small, left greater than right bilateral pleural effusions, similar to prior examination. Nodular thickening of the dependent left-sided parietal pleura. 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 Benay Spice for outpatient management.  Estimated body mass index is 24.48 kg/m as calculated from the following:   Height as of this encounter: 5\' 4"  (1.626 m).   Weight as of this encounter: 64.7 kg.  DVT prophylaxis: Lovenox  code Status DNR  family Communication: None at bedside  disposition Plan:   Status is: Inpatient  Dispo: The patient is from: Home  Anticipated d/c is to: SNF              Anticipated d/c date is: > 3 days              Patient currently is not medically stable to d/c.   Consultants:   PCCM, interventional radiology thoracic surgery and oncology  Procedures thoracentesis 04/01/2020 removed 1.5 L thoracentesis 04/04/2020 removed 1.5 L echo with normal ejection fraction.  Antimicrobials: None Subjective:  She is resting in bed no events overnight she is anxious to know what the next process is going to be She is aware that she has lung malignancy She is kind of overwhelmed that she has to follow-up with a lot of specialist including pulmonary, oncology, interventional radiology. Objective: Vitals:   05/07/20 2058 05/08/20 0500 05/08/20 0810 05/08/20 0848  BP: (!) 142/80 138/65  (!) 147/70  Pulse: 93 92  94  Resp: 18 18 18    Temp: 98 F (36.7 C) 98 F (36.7 C) 98.5 F (36.9 C) 98.7 F (37.1 C)  TempSrc: Oral Oral Oral Oral  SpO2: 95% 95% 94% 94%  Weight:      Height:       No intake or output data in the 24 hours ending 05/08/20 1144 Filed Weights   05/03/20 0512  Weight: 64.7 kg    Examination:  General exam: Appears calm and comfortable  Respiratory system: Decreased breath sounds at the bases to auscultation. Respiratory effort normal. Cardiovascular system: S1 & S2 heard, RRR. No JVD, murmurs, rubs, gallops or clicks. No pedal edema. Gastrointestinal system: Abdomen is distended, soft and nontender. No organomegaly or masses felt. Normal bowel sounds heard. Central nervous system: Alert and oriented. No focal neurological deficits. Extremities: 1+ nonpitting edema Skin: No rashes, lesions or ulcers Psychiatry: Judgement and insight appear normal. Mood & affect appropriate.     Data Reviewed: I have personally reviewed following labs and imaging studies  CBC: Recent Labs  Lab 05/02/20 0704 05/03/20 0213 05/04/20 0300  05/05/20 0314 05/07/20 0144  WBC 14.7* 11.5* 13.9* 11.5* 13.3*  NEUTROABS  --  8.5* 10.3* 8.6*  --   HGB 14.8 13.7 13.7 13.9 13.9  HCT 43.9 38.3 40.0 40.7 39.2  MCV 87.1 86.1 86.4 86.8 86.2  PLT 377 340 346 349 191   Basic Metabolic Panel: Recent Labs  Lab 05/01/20 1632 05/02/20 0704 05/03/20 0213 05/04/20 0300 05/05/20 0314 05/07/20 0144  NA 131* 131* 132* 134* 137 135  K 2.9* 4.1 3.7 3.7 3.4* 3.6  CL 93* 99 102 104 105 102  CO2 25 20* 22 23 23 25   GLUCOSE 125* 82 95 108* 106* 118*  BUN 15 11 13 11  7* 9  CREATININE 0.77 0.66 0.70 0.79 0.69 0.90  CALCIUM 10.6* 9.2 9.2 9.3 9.2 9.7  MG 2.2  --   --  2.1 2.1  --   PHOS  --   --   --  2.0* 3.6  --    GFR: Estimated Creatinine Clearance: 39.5 mL/min (by C-G formula based on SCr of 0.9 mg/dL). Liver Function Tests: Recent Labs  Lab 05/02/20 0704 05/03/20 0213 05/04/20 0300 05/05/20 0314 05/07/20 0144  AST 26 22 22 19 20   ALT 29 23 25 24 22   ALKPHOS 48 43 46 45 48  BILITOT 1.0 0.9 0.7 0.7 0.8  PROT 5.1* 4.7* 5.0* 5.0* 5.0*  ALBUMIN 2.8* 2.4* 2.5* 2.6* 2.4*   No results for input(s): LIPASE, AMYLASE in the last 168 hours. No results  for input(s): AMMONIA in the last 168 hours. Coagulation Profile: No results for input(s): INR, PROTIME in the last 168 hours. Cardiac Enzymes: No results for input(s): CKTOTAL, CKMB, CKMBINDEX, TROPONINI in the last 168 hours. BNP (last 3 results) No results for input(s): PROBNP in the last 8760 hours. HbA1C: No results for input(s): HGBA1C in the last 72 hours. CBG: No results for input(s): GLUCAP in the last 168 hours. Lipid Profile: No results for input(s): CHOL, HDL, LDLCALC, TRIG, CHOLHDL, LDLDIRECT in the last 72 hours. Thyroid Function Tests: No results for input(s): TSH, T4TOTAL, FREET4, T3FREE, THYROIDAB in the last 72 hours. Anemia Panel: No results for input(s): VITAMINB12, FOLATE, FERRITIN, TIBC, IRON, RETICCTPCT in the last 72 hours. Sepsis Labs: Recent Labs  Lab  05/02/20 0704  PROCALCITON <0.10    Recent Results (from the past 240 hour(s))  Resp Panel by RT-PCR (Flu A&B, Covid) Nasopharyngeal Swab     Status: None   Collection Time: 05/01/20  6:22 PM   Specimen: Nasopharyngeal Swab; Nasopharyngeal(NP) swabs in vial transport medium  Result Value Ref Range Status   SARS Coronavirus 2 by RT PCR NEGATIVE NEGATIVE Final    Comment: (NOTE) SARS-CoV-2 target nucleic acids are NOT DETECTED.  The SARS-CoV-2 RNA is generally detectable in upper respiratory specimens during the acute phase of infection. The lowest concentration of SARS-CoV-2 viral copies this assay can detect is 138 copies/mL. A negative result does not preclude SARS-Cov-2 infection and should not be used as the sole basis for treatment or other patient management decisions. A negative result may occur with  improper specimen collection/handling, submission of specimen other than nasopharyngeal swab, presence of viral mutation(s) within the areas targeted by this assay, and inadequate number of viral copies(<138 copies/mL). A negative result must be combined with clinical observations, patient history, and epidemiological information. The expected result is Negative.  Fact Sheet for Patients:  EntrepreneurPulse.com.au  Fact Sheet for Healthcare Providers:  IncredibleEmployment.be  This test is no t yet approved or cleared by the [place] FDA and  has been authorized for detection and/or diagnosis of SARS-CoV-2 by FDA under an Emergency Use Authorization (EUA). This EUA will remain  in effect (meaning this test can be used) for the duration of the COVID-19 declaration under Section 564(b)(1) of the Act, 21 U.S.C.section 360bbb-3(b)(1), unless the authorization is terminated  or revoked sooner.       Influenza A by PCR NEGATIVE NEGATIVE Final   Influenza B by PCR NEGATIVE NEGATIVE Final    Comment: (NOTE) The Xpert Xpress  SARS-CoV-2/FLU/RSV plus assay is intended as an aid in the diagnosis of influenza from Nasopharyngeal swab specimens and should not be used as a sole basis for treatment. Nasal washings and aspirates are unacceptable for Xpert Xpress SARS-CoV-2/FLU/RSV testing.  Fact Sheet for Patients: EntrepreneurPulse.com.au  Fact Sheet for Healthcare Providers: IncredibleEmployment.be  This test is not yet approved or cleared by the [place] FDA and has been authorized for detection and/or diagnosis of SARS-CoV-2 by FDA under an Emergency Use Authorization (EUA). This EUA will remain in effect (meaning this test can be used) for the duration of the COVID-19 declaration under Section 564(b)(1) of the Act, 21 U.S.C. section 360bbb-3(b)(1), unless the authorization is terminated or revoked.  Performed at [facility], [place] [address], [place], [place] [address]   Body fluid culture (includes gram stain)     Status: None   Collection Time: 05/01/20  8:38 PM   Specimen: Pleural Fluid  Result Value  Ref Range Status   Specimen Description FLUID PLEURAL  Final   Special Requests NONE  Final   Gram Stain   Final    RARE WBC PRESENT, PREDOMINANTLY MONONUCLEAR NO ORGANISMS SEEN    Culture   Final    NO GROWTH Performed at [facility], [place] [address], [place], [place] [address]    Report Status 05/05/2020 FINAL  Final  Culture, blood (routine x 2)     Status: None   Collection Time: 05/01/20 11:05 PM   Specimen: BLOOD  Result Value Ref Range Status   Specimen Description BLOOD SITE NOT SPECIFIED  Final   Special Requests   Final    BOTTLES DRAWN AEROBIC AND ANAEROBIC Blood Culture adequate volume   Culture   Final    NO GROWTH 5 DAYS Performed at [facility], [place] [address] [place], [place] [address]    Report Status 05/07/2020 FINAL  Final  Culture, blood (routine x 2)     Status: None   Collection Time: 05/02/20  2:52 PM    Specimen: BLOOD  Result Value Ref Range Status   Specimen Description BLOOD RIGHT ANTECUBITAL  Final   Special Requests   Final    BOTTLES DRAWN AEROBIC AND ANAEROBIC Blood Culture adequate volume   Culture   Final    NO GROWTH 5 DAYS Performed at [facility], [place] [address], [place], [place] [address]    Report Status 05/07/2020 FINAL  Final  Body fluid culture     Status: None   Collection Time: 05/04/20  2:09 PM   Specimen: Lung, Left; Pleural Fluid  Result Value Ref Range Status   Specimen Description PLEURAL FLUID  Final   Special Requests LEFT LUNG  Final   Gram Stain   Final    RARE WBC PRESENT, PREDOMINANTLY MONONUCLEAR NO ORGANISMS SEEN    Culture   Final    NO GROWTH Performed at [facility], [place] [address], [place],  [address]    Report Status 05/07/2020 FINAL  Final  Acid Fast Smear (AFB)     Status: None   Collection Time: 05/04/20  2:09 PM   Specimen: Lung, Left; Pleural Fluid  Result Value Ref Range Status   AFB Specimen Processing Concentration  Final   Acid Fast Smear Negative  Final    Comment: (NOTE) Performed At: [facility] [place], [place] [id number] Rush Farmer MD OX:[id number]    Source (AFB) PLEURAL  Final    Comment: FLUID LEFT LUNG Performed at [facility], [place] [address] [place],  [address]          Radiology Studies: No results found.      Scheduled Meds: . atorvastatin  20 mg Oral QHS  . furosemide  40 mg Intravenous Once  . metoprolol succinate  50 mg Oral Daily  . pantoprazole  40 mg Oral BID AC   Continuous Infusions:   LOS: 6 days   Georgette Shell, MD  05/08/2020, 11:44 AM

## 2020-05-09 ENCOUNTER — Encounter: Payer: Self-pay | Admitting: *Deleted

## 2020-05-09 ENCOUNTER — Inpatient Hospital Stay (HOSPITAL_COMMUNITY): Payer: Medicare PPO

## 2020-05-09 DIAGNOSIS — J9601 Acute respiratory failure with hypoxia: Secondary | ICD-10-CM | POA: Diagnosis not present

## 2020-05-09 DIAGNOSIS — C3490 Malignant neoplasm of unspecified part of unspecified bronchus or lung: Secondary | ICD-10-CM

## 2020-05-09 DIAGNOSIS — E876 Hypokalemia: Secondary | ICD-10-CM | POA: Diagnosis not present

## 2020-05-09 DIAGNOSIS — I10 Essential (primary) hypertension: Secondary | ICD-10-CM | POA: Diagnosis not present

## 2020-05-09 DIAGNOSIS — J90 Pleural effusion, not elsewhere classified: Secondary | ICD-10-CM | POA: Diagnosis not present

## 2020-05-09 LAB — COMPREHENSIVE METABOLIC PANEL
ALT: 20 U/L (ref 0–44)
AST: 17 U/L (ref 15–41)
Albumin: 2.4 g/dL — ABNORMAL LOW (ref 3.5–5.0)
Alkaline Phosphatase: 51 U/L (ref 38–126)
Anion gap: 4 — ABNORMAL LOW (ref 5–15)
BUN: 8 mg/dL (ref 8–23)
CO2: 26 mmol/L (ref 22–32)
Calcium: 9.6 mg/dL (ref 8.9–10.3)
Chloride: 106 mmol/L (ref 98–111)
Creatinine, Ser: 0.79 mg/dL (ref 0.44–1.00)
GFR, Estimated: >60 mL/min (ref >60)
Glucose, Bld: 108 mg/dL — ABNORMAL HIGH (ref 70–99)
Potassium: 4.3 mmol/L (ref 3.5–5.1)
Sodium: 136 mmol/L (ref 135–145)
Total Bilirubin: 0.5 mg/dL (ref 0.3–1.2)
Total Protein: 5.1 g/dL — ABNORMAL LOW (ref 6.5–8.1)

## 2020-05-09 LAB — CBC WITH DIFFERENTIAL/PLATELET
Abs Immature Granulocytes: 0.06 10*3/uL (ref 0.00–0.07)
Basophils Absolute: 0.1 10*3/uL (ref 0.0–0.1)
Basophils Relative: 1 %
Eosinophils Absolute: 0.4 10*3/uL (ref 0.0–0.5)
Eosinophils Relative: 3 %
HCT: 43.3 % (ref 36.0–46.0)
Hemoglobin: 14 g/dL (ref 12.0–15.0)
Immature Granulocytes: 1 %
Lymphocytes Relative: 12 %
Lymphs Abs: 1.4 10*3/uL (ref 0.7–4.0)
MCH: 29.4 pg (ref 26.0–34.0)
MCHC: 32.3 g/dL (ref 30.0–36.0)
MCV: 90.8 fL (ref 80.0–100.0)
Monocytes Absolute: 1.3 10*3/uL — ABNORMAL HIGH (ref 0.1–1.0)
Monocytes Relative: 11 %
Neutro Abs: 8.5 10*3/uL — ABNORMAL HIGH (ref 1.7–7.7)
Neutrophils Relative %: 72 %
Platelets: 248 10*3/uL (ref 150–400)
RBC: 4.77 MIL/uL (ref 3.87–5.11)
RDW: 15.1 % (ref 11.5–15.5)
WBC: 11.6 10*3/uL — ABNORMAL HIGH (ref 4.0–10.5)
nRBC: 0 % (ref 0.0–0.2)

## 2020-05-09 LAB — PHOSPHORUS: Phosphorus: 3.1 mg/dL (ref 2.5–4.6)

## 2020-05-09 LAB — MAGNESIUM: Magnesium: 2.1 mg/dL (ref 1.7–2.4)

## 2020-05-09 NOTE — Progress Notes (Signed)
I received referral on Mr. Whack by Dr. Benay Spice. I updated new patient coordinator to call and schedule her to be seen on 05/18/19 with [name] and Dr. Julien Nordmann.

## 2020-05-09 NOTE — Progress Notes (Signed)
PROGRESS NOTE    Shelley Flores  EGB:[id number] DOB: August 18, 1932 DOA: 05/01/2020 PCP: Cari Caraway, MD   Brief Narrative:  HPI per Dr. [name] on 05/01/20  HPI: Shelley Flores is a 86 y.o. female with medical history significant of hypertension, hyperlipidemia, mild valvular heart disease presenting with complaints of shortness of breath.  Patient states she has been coughing since the end of October and was seen by her doctor who thought the cough was related to her having acid reflux so she was started on Prilosec but her cough is persisted.  For the past 10 days she has had dyspnea on exertion and fatigue.  She had an x-ray done on 12/17 which revealed a large left pleural effusion.  She was supposed to get IR thoracentesis but it has not been scheduled yet.  Since her dyspnea was worsening she came into the ED today to be evaluated.  Denies fevers and states she has been fully vaccinated against Covid.  Denies chest pain.  Denies prior history of pleural effusion/thoracentesis.  Denies history of malignancy.  She smoked cigarettes for about 4 years back in her 57s when she was in graduate school but not since then.  Denies unintentional weight loss.  Reports having abdominal distention for several months.  Also complaining of early satiety/loss of appetite.  No other complaints.  ED Course: Afebrile.  Not tachycardic.  Tachypneic with respiratory rate in the 20s.  Blood pressure elevated with systolic up to 371G.  WBC 13.4, hemoglobin 15.8, hematocrit 45.4, platelet 473K.  Sodium 131, potassium 2.9, chloride 93, bicarb 25, BUN 15, creatinine 0.7, glucose 125.  Magnesium 2.2.  BNP 70.  SARS-CoV-2 PCR test and influenza panel both negative.  Blood culture x2 pending.  Chest x-ray showing a large left pleural effusion with subtotal atelectasis of the left lung and mediastinal shift left to right, little change since 04/28/2020.  Underwent thoracentesis for large left pleural effusion  in the ED and 1500 cc fluid removed.  Repeat chest x-ray showing interval decrease in size of a now small to moderate volume left pleural effusion; underlying infection, inflammation, pulmonary mass not excluded; trace right pleural effusion.  Not hypoxic at rest but sats dropped to 90% with ambulation.  She was given ceftriaxone and azithromycin.  Also given oral potassium 40 mEq for hypokalemia.  Admission requested for observation.  **Interim History  Patient underwent a left-sided thoracentesis x2 and drained 1.5 L of suggestive of an exudative effusion each time.  She was recently seen in October by her doctor for dyspnea and found to have a pleural effusion on admission. She was started on IV ceftriaxone and azithromycin and pulmonary is consulted for further evaluation recommendations given her very large pleural effusion. Cytology shows Malignant cells and patient has Adenocarcinoma. Currently awaiting Plure-X Catheter placement and after will discharge to SNF as PT recommends SNF now and she will follow up with Pulmonary on 05/23/19 and Medical Oncology on 05/18/19. LE Venous Duplex negative for DVT. Repeat CT Scan showed "  Assessment & Plan:   Principal Problem:   Pleural effusion Active Problems:   Essential hypertension   Acute hypoxemic respiratory failure (HCC)   Hyponatremia   Hypokalemia  Acute Respiratory Failure with Hypoxia secondary to a Large Left Pleural Effusion  -Patient presenting with complaints of dyspnea.   -She had a chest x-ray done on 12/17 which revealed a large left pleural effusion.  -She was supposed to get IR thoracentesis but it was not  scheduled as an outpatient.   -Chest x-ray done on admission and was showing a large left pleural effusion with subtotal atelectasis of the left lung and mediastinal shift left to right, little change since 04/28/2020.   -Underwent thoracentesis for large left pleural effusion in the ED and 1500 cc fluid removed.   -Repeat chest  x-ray 05/02/20 showing interval decrease in size of a now small to moderate volume left pleural effusion; underlying infection, inflammation, pulmonary mass not excluded; trace right pleural effusion.   -Echo done in November 2019 showing normal LVEF of 83-38%, grade 1 diastolic dysfunction, and no significant valvular disease. BNP normal.   -Based on pleural fluid analysis labs, pleural fluid/serum protein ratio 0.60 and pleural fluid/serum LDH ratio of 0.87 consistent with an exudative effusion.  -?Parapneumonic effusion versus possible underlying malignancy.  -Pleural fluid glucose 101 and WBC count normal.  SARS-CoV-2 PCR test negative.   -On admission she had a Mild leukocytosis on labs but no fever, tachycardia, or hypotension to suggest sepsis. Not hypoxic at rest but sats dropped to 90% with ambulation in the ED. 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 Tresa Moore in Pathology), and pleural fluid culture showed NGTD at 5Days. -Abx now stopped  -CT Scan ordered for further evaluation and showed "Moderate left and small right pleural effusions. The left pleural effusion demonstrates areas of conspicuous pleural thickening and small pleural nodularity. While this could reflect empyema, a malignant effusion with pleural deposits would present similarly. Furthermore, there is heterogeneous enhancement of the adjacent atelectatic lung parenchyma including within the lingula, anterior segment left upper lobe and anterior basal segment of the left lower lobe. Could reflect underlying airspace disease or malignancy with additional airspace disease seen throughout aerated portions of the left lung. Asymmetric soft tissue edema across the left chest wall. No clear extension of the pleural or airspace processes in to the chest wall proper. Possibly reactive though should with exam findings and continued attention on  follow-up imaging is recommended. Some questionable thickening of the rectosigmoid though may be related to underdistention though some mild perirectal fat stranding is present however and could suggest an underlying proctitis. Aortic Atherosclerosis."  -Blood culture x2 Showed NGTD 5 Days.  Continue to monitor WBC count as it is fluctuating and went from 13.4 -> 14.7 -> 11.5 -> 13.9 -> 11.5 -> 13.3 -> 12.7 -> 11.6 -Check procalcitonin level and was <0.10. Abx now stopped -SpO2: 95 % O2 Flow Rate (L/min): 2 L/min; Now on and off of Supplemental O2 and will require it at D/C  -Continue supplemental oxygen via nasal cannula and wean O2 as tolerated -Continuous pulse Oximetry and Maintain oxygen saturation above 92%.   -ECHO done as below and showing EF of 60-65% and G1DD -Will consult Pulmonary for further evaluation and Recommendations and they are recommending a thoracentesis today.  Thoracentesis was done and she had 1.55 Liters removed again  -Repeat CXR showed "Stable left hilar mass with progressive surrounding airspace consolidation and near complete left lower lobe/lingular atelectasis. Suspect enlarging left pleural effusion.." -Pulmonary to weigh in further and have recommended repeating a CT Scan of the Chest with Contrast and recommended calling [facility] to evaluate for VATS Pleurodesis vs Pleur-X Catheter Placement ; Will obtain LE Venous Duplex and this was Negative for DVT  -I spoke with Dr. Kipp Brood of [facility] and he recommends IR to place a Pleur-X Catheter; Dr. Rodena Piety spoke with  IR and apparently IR will not place the Pleurx catheter until she is close to being discharged and has a disposition in place in terms of going to SNF.  Recommendation is for SNF and will have IR anticipate placing Pleurx cath the next few days; Timing of Pluer-X to be done by IR -Repeat CT scan of the chest showed "Left upper lobe masslike consolidation consistent with malignancy. A portion of the mass invades the  mediastinum at the level of the aortic or pulmonary window. PET-CT may be useful to delineate the mass from the postobstructive change. 2. Prominence of the left upper lobe pulmonary interstitium, concerning for lymphangitic spread of disease. 3. A subcarinal lymphadenopathy worrisome for nodal metastasis. Again, PET-CT may be useful. 4. Small bilateral pleural effusions, partially loculated on the left. Decreased left effusion after recent thoracentesis. 5.  Aortic Atherosclerosis." -Because of this she also underwent a CT of the abdomen and pelvis with contrast which showed "No definite evidence of malignancy or metastatic disease in the abdomen or pelvis. Examination is generally unchanged compared to recent CT dated 05/02/2020.  No evidence of urologic malignancy per ordering indication. Definitively benign bilateral renal cysts for which no further characterization or follow-up is specifically required. Multiple subcentimeter low-attenuation lesions of the liver, generally too small to confidently characterize, although the largest are clearly simple cysts, remaining smaller lesions almost certainly incidental small cysts or hemangiomata. No findings suspicious for hepatic metastatic disease. There is redemonstrated mild rectal wall thickening and minimal perirectal fat stranding, again suggesting nonspecific proctitis. Correlate with referable signs and symptoms if present. Synchronous rectal malignancy would be difficult to strictly exclude. Consider digital and/or endoscopic examination. There is an unchanged 1.1 cm fluid low-attenuation lesion of the pancreatic tail. 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 There is no pancreatic ductal dilatation. Given new diagnosis of advanced stage lung malignancy, recommend attention on follow-up with future clinically indicated oncology imaging.  Small, left greater than right bilateral pleural effusions, similar to prior examination. Nodular thickening of the  dependent left-sided parietal pleura. Findings are consistent with pleural metastatic disease as diagnosed by thoracentesis" -I also spoke with Dr. Benay Spice of Oncology who recommends outpatient follow up and patient will need Molecular Testing and additional workup including PET Scan; outpatient appointment with Dr. Julien Nordmann as already been scheduled for May 17, 2020 as well as with pulmonary Dr. Valeta Harms on May 22, 2020  Abdominal Distention and Early Satiety, in the setting of her malignancy as above -Patient reports history of abdominal distention for several months and also complaining of early satiety/loss of appetite.   -No complains of abdominal pain, nausea, vomiting.  Abdomen does appear significantly distended on exam but nontender. ?Abdominal mass/ ascites. -CT ordered for further evaluation as above -C/w PPI Pantoprazole 40 mg po Daily -Evaluated for Mass  Bilateral Lower Extremity Edema -BNP normal.  She was previously on amlodipine which was stopped during cardiology visit last month. -Has 1-2+ mildly pitting edema of bilateral lower extremities but mostly non-pitting. -IV Lasix 40 mg x 1 ordered on admission and patient refused; ordered IV Lasix again and she refused again.  -ECHOCardiogram showed Grade 1 DD and normal EF of 60-65% -Could be in the setting of poor Nutritional Status and Hypoalbuminemia  -Will obtain LE Venous Duplex to r/o DVT and was Negative   Hyponatremia -Mild -Sodium 131 on admission and is now improved to 136 -Possibly due to hypervolemia or could be due to home thiazide diuretic use. -IV Lasix 40  mg x 1 ordered and patient agreeable to taking it today  -Check serum osmolarity and repeat CMP in the AM   Hypokalemia -Potassium 2.9 on admission and improved to 4.3 -Likely related to home diuretic use.  Magnesium level normal at 2.2 on Admission. -Continue to Monitor and Replete as Necessary -Repeat CMP in the AM   Hypertension -Stable.   -Amlodipine was discontinued during recent cardiology visit due to patient having worsening lower extremity edema and will continue to Hold -Continue Metoprolol Succinate 50 mg po daily -Continue to Monitor BP per Protocol -Last BP reading was 145/75  Hyperlipidemia -C/w Atorvastatin 20 mg po daily   PVCs -C/w telemetry Monitoring -C/w Metoprolol Succinate 50 mg po Daily  Hypophosphatemia -Patient's Phos Level was 3.1 -Continue to Monitor and Replete as Necessary -Repeat Phos Level in the AM    Left Arm IV Infiltration r/o Phlebitis  -Warm Compresses -Area was erythematous and slightly boggy -If persists will get an U/S of the ARM  -Elevation of the Extremity   GERD -Continue PPI with Prilosec Substitution of po Pantoprazole 40 mg po BID daily   DVT prophylaxis: Enoxaparin 40 mg sq q24h Code Status: DO NOT RESUSCITATE Family Communication: No family present at bedside  Disposition Plan: Pending further improvement and workup and evaluation and clearance by Pulmonary  Status is: Inpatient  Remains inpatient appropriate because:Unsafe d/c plan, IV treatments appropriate due to intensity of illness or inability to take PO and Inpatient level of care appropriate due to severity of illness   Dispo: The patient is from: Home              Anticipated d/c is to: TBD              Anticipated d/c date is: 1-2 days              Patient currently is not medically stable to d/c.  Consultants:   Interventional Radiology  Pulmonary/Critical Care  Discussed with Dr. Melodie Bouillon of [facility]  Discussed with Dr. Benay Spice of Medical Oncology   Procedures:  Thoracentesis 04/01/20 removed 1500 mL   Thoracentesis 04/04/20 removed 1.55 Liters of Hazy Amber Fluid   ECHOCARDIOGRAM IMPRESSIONS    1. Left ventricular ejection fraction, by estimation, is 60 to 65%. The  left ventricle has normal function. The left ventricle has no regional  wall motion abnormalities. Left  ventricular diastolic parameters are  consistent with Grade I diastolic  dysfunction (impaired relaxation). Elevated left ventricular end-diastolic  pressure.  2. Right ventricular systolic function is normal. The right ventricular  size is normal.  3. The mitral valve is degenerative. No evidence of mitral valve  regurgitation. No evidence of mitral stenosis. Moderate mitral annular  calcification.  4. The aortic valve is normal in structure. Aortic valve regurgitation is  not visualized. No aortic stenosis is present.  5. The inferior vena cava is normal in size with greater than 50%  respiratory variability, suggesting right atrial pressure of 3 mmHg.   FINDINGS  Left Ventricle: Left ventricular ejection fraction, by estimation, is 60  to 65%. The left ventricle has normal function. The left ventricle has no  regional wall motion abnormalities. The left ventricular internal cavity  size was normal in size. There is  no left ventricular hypertrophy. Left ventricular diastolic parameters  are consistent with Grade I diastolic dysfunction (impaired relaxation).  Elevated left ventricular end-diastolic pressure.   Right Ventricle: The right ventricular size is normal. No increase in  right ventricular  wall thickness. Right ventricular systolic function is  normal.   Left Atrium: Left atrial size was normal in size.   Right Atrium: Right atrial size was normal in size.   Pericardium: There is no evidence of pericardial effusion.   Mitral Valve: The mitral valve is degenerative in appearance. Moderate  mitral annular calcification. No evidence of mitral valve regurgitation.  No evidence of mitral valve stenosis.   Tricuspid Valve: The tricuspid valve is normal in structure. Tricuspid  valve regurgitation is not demonstrated. No evidence of tricuspid  stenosis.   Aortic Valve: The aortic valve is normal in structure. Aortic valve  regurgitation is not visualized. No aortic  stenosis is present.   Pulmonic Valve: The pulmonic valve was normal in structure. Pulmonic valve  regurgitation is not visualized. No evidence of pulmonic stenosis.   Aorta: The aortic root is normal in size and structure.   Venous: The inferior vena cava is normal in size with greater than 50%  respiratory variability, suggesting right atrial pressure of 3 mmHg.   IAS/Shunts: No atrial level shunt detected by color flow Doppler.     LEFT VENTRICLE  PLAX 2D  LVIDd:     3.30 cm Diastology  LVIDs:     1.60 cm LV e' medial:  4.03 cm/s  LV PW:     1.00 cm LV E/e' medial: 19.9  LV IVS:    1.00 cm LV e' lateral:  5.00 cm/s             LV E/e' lateral: 16.0     RIGHT VENTRICLE  RV Basal diam: 2.20 cm  RV S prime:   2.50 cm/s  TAPSE (M-mode): 2.0 cm   LEFT ATRIUM       Index    RIGHT ATRIUM     Index  LA Vol (A2C):  26.5 ml 15.64 ml/m RA Area:   8.27 cm  LA Vol (A4C):  23.7 ml 13.99 ml/m RA Volume:  15.00 ml 8.85 ml/m  LA Biplane Vol: 26.8 ml 15.82 ml/m  AORTIC VALVE  LVOT Vmax:  120.00 cm/s  LVOT Vmean: 84.000 cm/s  LVOT VTI:  0.220 m    AORTA  Ao Root diam: 2.60 cm   MITRAL VALVE  MV Area (PHT): 3.60 cm   SHUNTS  MV Decel Time: 211 msec   Systemic VTI: 0.22 m  MV E velocity: 80.20 cm/s  MV A velocity: 127.00 cm/s  MV E/A ratio: 0.63   Antimicrobials:  Anti-infectives (From admission, onward)   Start     Dose/Rate Route Frequency Ordered Stop   05/03/20 1500  azithromycin (ZITHROMAX) tablet 500 mg  Status:  Discontinued        500 mg Oral Daily 05/03/20 1500 05/03/20 1714   05/02/20 2200  azithromycin (ZITHROMAX) 500 mg in sodium chloride 0.9 % 250 mL IVPB  Status:  Discontinued        500 mg 250 mL/hr over 60 Minutes Intravenous Every 24 hours 05/01/20 2354 05/03/20 1500   05/02/20 2200  cefTRIAXone (ROCEPHIN) 1 g in sodium chloride 0.9 % 100 mL IVPB  Status:  Discontinued        1 g 200 mL/hr  over 30 Minutes Intravenous Every 24 hours 05/01/20 2354 05/03/20 1714   05/01/20 2300  cefTRIAXone (ROCEPHIN) 1 g in sodium chloride 0.9 % 100 mL IVPB        1 g 200 mL/hr over 30 Minutes Intravenous  Once 05/01/20 2255 05/01/20 2347   05/01/20 2300  azithromycin (ZITHROMAX) 500 mg in sodium chloride 0.9 % 250 mL IVPB        500 mg 250 mL/hr over 60 Minutes Intravenous  Once 05/01/20 2255 05/02/20 0054        Subjective: Seen and examined at bedside and remains coughing and states it has been bothering her.  She is a little frustrated that she been waiting and has not heard anything about getting a Pleurx catheter placed.  Disposition was discussed and she will need to go to SNF facility.  Once disposition is obtained she will get a Pleurx catheter the day before or the day of discharge.  PT OT recommending SNF.  She denies any lightheadedness or dizziness and still feels about the same and still feels a little dyspneic  Objective: Vitals:   05/08/20 0848 05/08/20 1931 05/09/20 0533 05/09/20 0900  BP: (!) 147/70 (!) 148/73 132/68 (!) 145/75  Pulse: 94 93 92 (!) 102  Resp:  18 16 14   Temp: 98.7 F (37.1 C) 98.6 F (37 C) 97.9 F (36.6 C) 97.9 F (36.6 C)  TempSrc: Oral Oral Oral Oral  SpO2: 94% 94% 95%   Weight:      Height:       No intake or output data in the 24 hours ending 05/09/20 1625 Filed Weights   05/03/20 0512  Weight: 64.7 kg   Examination: Physical Exam:  Constitutional: WN/WD elderly Caucasian female appears calm but does appear slightly frustrated and is mildly uncomfortable coughing Eyes: Lids and conjunctivae normal, sclerae anicteric  ENMT: External Ears, Nose appear normal. Grossly normal hearing.  Neck: Appears normal, supple, no cervical masses, normal ROM, no appreciable thyromegaly; no JVD Respiratory: Diminished to auscultation bilaterally with coarse breath sounds worse on the left compared to the right and does have some crackles and rhonchi.  She  is on normal respiratory effort but she is now wearing supplemental oxygen via nasal cannula but is not tachypneic but is coughing quite a bit today. Cardiovascular: RRR, no murmurs / rubs / gallops. S1 and S2 auscultated.  Has 1+ extremity edema which is nonpitting Abdomen: Soft, non-tender, non-distended. Bowel sounds positive.  GU: Deferred. Musculoskeletal: No clubbing / cyanosis of digits/nails. No joint deformity upper and lower extremities. Skin: No rashes, lesions, ulcers on limited skin evaluation. No induration; Warm and dry.  Neurologic: CN 2-12 grossly intact with no focal deficits. Romberg sign and cerebellar reflexes not assessed.  Psychiatric: Normal judgment and insight. Alert and oriented x 3. Normal mood and appropriate affect.   Data Reviewed: I have personally reviewed following labs and imaging studies  CBC: Recent Labs  Lab 05/03/20 0213 05/04/20 0300 05/05/20 0314 05/07/20 0144 05/08/20 1812 05/09/20 0319  WBC 11.5* 13.9* 11.5* 13.3* 12.7* 11.6*  NEUTROABS 8.5* 10.3* 8.6*  --   --  8.5*  HGB 13.7 13.7 13.9 13.9 15.4* 14.0  HCT 38.3 40.0 40.7 39.2 44.2 43.3  MCV 86.1 86.4 86.8 86.2 87.2 90.8  PLT 340 346 349 276 283 481   Basic Metabolic Panel: Recent Labs  Lab 05/03/20 0213 05/04/20 0300 05/05/20 0314 05/07/20 0144 05/09/20 0319  NA 132* 134* 137 135 136  K 3.7 3.7 3.4* 3.6 4.3  CL 102 104 105 102 106  CO2 22 23 23 25 26   GLUCOSE 95 108* 106* 118* 108*  BUN 13 11 7* 9 8  CREATININE 0.70 0.79 0.69 0.90 0.79  CALCIUM 9.2 9.3 9.2 9.7 9.6  MG  --  2.1 2.1  --  2.1  PHOS  --  2.0* 3.6  --  3.1   GFR: Estimated Creatinine Clearance: 44.4 mL/min (by C-G formula based on SCr of 0.79 mg/dL). Liver Function Tests: Recent Labs  Lab 05/03/20 0213 05/04/20 0300 05/05/20 0314 05/07/20 0144 05/09/20 0319  AST 22 22 19 20 17   ALT 23 25 24 22 20   ALKPHOS 43 46 45 48 51  BILITOT 0.9 0.7 0.7 0.8 0.5  PROT 4.7* 5.0* 5.0* 5.0* 5.1*  ALBUMIN 2.4* 2.5*  2.6* 2.4* 2.4*   No results for input(s): LIPASE, AMYLASE in the last 168 hours. No results for input(s): AMMONIA in the last 168 hours. Coagulation Profile: No results for input(s): INR, PROTIME in the last 168 hours. Cardiac Enzymes: No results for input(s): CKTOTAL, CKMB, CKMBINDEX, TROPONINI in the last 168 hours. BNP (last 3 results) No results for input(s): PROBNP in the last 8760 hours. HbA1C: No results for input(s): HGBA1C in the last 72 hours. CBG: No results for input(s): GLUCAP in the last 168 hours. Lipid Profile: No results for input(s): CHOL, HDL, LDLCALC, TRIG, CHOLHDL, LDLDIRECT in the last 72 hours. Thyroid Function Tests: No results for input(s): TSH, T4TOTAL, FREET4, T3FREE, THYROIDAB in the last 72 hours. Anemia Panel: No results for input(s): VITAMINB12, FOLATE, FERRITIN, TIBC, IRON, RETICCTPCT in the last 72 hours. Sepsis Labs: No results for input(s): PROCALCITON, LATICACIDVEN in the last 168 hours.  Recent Results (from the past 240 hour(s))  Resp Panel by RT-PCR (Flu A&B, Covid) Nasopharyngeal Swab     Status: None   Collection Time: 05/01/20  6:22 PM   Specimen: Nasopharyngeal Swab; Nasopharyngeal(NP) swabs in vial transport medium  Result Value Ref Range Status   SARS Coronavirus 2 by RT PCR NEGATIVE NEGATIVE Final    Comment: (NOTE) SARS-CoV-2 target nucleic acids are NOT DETECTED.  The SARS-CoV-2 RNA is generally detectable in upper respiratory specimens during the acute phase of infection. The lowest concentration of SARS-CoV-2 viral copies this assay can detect is 138 copies/mL. A negative result does not preclude SARS-Cov-2 infection and should not be used as the sole basis for treatment or other patient management decisions. A negative result may occur with  improper specimen collection/handling, submission of specimen other than nasopharyngeal swab, presence of viral mutation(s) within the areas targeted by this assay, and inadequate number  of viral copies(<138 copies/mL). A negative result must be combined with clinical observations, patient history, and epidemiological information. The expected result is Negative.  Fact Sheet for Patients:  EntrepreneurPulse.com.au  Fact Sheet for Healthcare Providers:  IncredibleEmployment.be  This test is no t yet approved or cleared by the [place] FDA and  has been authorized for detection and/or diagnosis of SARS-CoV-2 by FDA under an Emergency Use Authorization (EUA). This EUA will remain  in effect (meaning this test can be used) for the duration of the COVID-19 declaration under Section 564(b)(1) of the Act, 21 U.S.C.section 360bbb-3(b)(1), unless the authorization is terminated  or revoked sooner.       Influenza A by PCR NEGATIVE NEGATIVE Final   Influenza B by PCR NEGATIVE NEGATIVE Final    Comment: (NOTE) The Xpert Xpress SARS-CoV-2/FLU/RSV plus assay is intended as an aid in the diagnosis of influenza from Nasopharyngeal swab specimens and should not be used as a sole basis for treatment. Nasal washings and aspirates are unacceptable for Xpert Xpress SARS-CoV-2/FLU/RSV testing.  Fact Sheet for Patients: EntrepreneurPulse.com.au  Fact Sheet for Healthcare Providers: IncredibleEmployment.be  This test is not yet approved or cleared  by the [place] and has been authorized for detection and/or diagnosis of SARS-CoV-2 by FDA under an Emergency Use Authorization (EUA). This EUA will remain in effect (meaning this test can be used) for the duration of the COVID-19 declaration under Section 564(b)(1) of the Act, 21 U.S.C. section 360bbb-3(b)(1), unless the authorization is terminated or revoked.  Performed at [facility], [place] [address] [place], [place] [address]   Body fluid culture (includes gram stain)     Status: None   Collection Time: 05/01/20  8:38 PM   Specimen:  Pleural Fluid  Result Value Ref Range Status   Specimen Description FLUID PLEURAL  Final   Special Requests NONE  Final   Gram Stain   Final    RARE WBC PRESENT, PREDOMINANTLY MONONUCLEAR NO ORGANISMS SEEN    Culture   Final    NO GROWTH Performed at [facility], [place] [address], [place], [place] [address]    Report Status 05/05/2020 FINAL  Final  Culture, blood (routine x 2)     Status: None   Collection Time: 05/01/20 11:05 PM   Specimen: BLOOD  Result Value Ref Range Status   Specimen Description BLOOD SITE NOT SPECIFIED  Final   Special Requests   Final    BOTTLES DRAWN AEROBIC AND ANAEROBIC Blood Culture adequate volume   Culture   Final    NO GROWTH 5 DAYS Performed at [facility], [place] [address] [place], [place] [address]    Report Status 05/07/2020 FINAL  Final  Culture, blood (routine x 2)     Status: None   Collection Time: 05/02/20  2:52 PM   Specimen: BLOOD  Result Value Ref Range Status   Specimen Description BLOOD RIGHT ANTECUBITAL  Final   Special Requests   Final    BOTTLES DRAWN AEROBIC AND ANAEROBIC Blood Culture adequate volume   Culture   Final    NO GROWTH 5 DAYS Performed at [facility], [place] [address] [place], [place] [address]    Report Status 05/07/2020 FINAL  Final  Body fluid culture     Status: None   Collection Time: 05/04/20  2:09 PM   Specimen: Lung, Left; Pleural Fluid  Result Value Ref Range Status   Specimen Description PLEURAL FLUID  Final   Special Requests LEFT LUNG  Final   Gram Stain   Final    RARE WBC PRESENT, PREDOMINANTLY MONONUCLEAR NO ORGANISMS SEEN    Culture   Final    NO GROWTH Performed at [facility], [place] [address] [place], [place] [address]    Report Status 05/07/2020 FINAL  Final  Acid Fast Smear (AFB)     Status: None   Collection Time: 05/04/20  2:09 PM   Specimen: Lung, Left; Pleural Fluid  Result Value Ref Range Status   AFB Specimen Processing Concentration  Final   Acid  Fast Smear Negative  Final    Comment: (NOTE) Performed At: [facility] [place], [place] [id number] Rush Farmer MD [id number]    Source (AFB) PLEURAL  Final    Comment: FLUID LEFT LUNG Performed at [facility], [place] [address], [place],  [address]      RN Pressure Injury Documentation:     Estimated body mass index is 24.48 kg/m as calculated from the following:   Height as of this encounter: 5\' 4"  (1.626 m).   Weight as of this encounter: 64.7  kg.  Malnutrition Type:    Malnutrition Characteristics:   Nutrition Interventions:    Radiology Studies: DG CHEST PORT 1 VIEW  Result Date: 05/09/2020 CLINICAL DATA:  Shortness of breath. EXAM: PORTABLE CHEST 1 VIEW COMPARISON:  Chest x-ray 05/06/2020 and CT chest 05/05/2020. FINDINGS: Stable left hilar mass with progressive surrounding airspace consolidation and near complete left lower lobe atelectasis. Suspect enlarging left pleural effusion. Stable small right pleural effusion. The right lung remains relatively clear. IMPRESSION: Stable left hilar mass with progressive surrounding airspace consolidation and near complete left lower lobe/lingular atelectasis. Suspect enlarging left pleural effusion. Electronically Signed   By: Marijo Sanes M.D.   On: 05/09/2020 09:25   Scheduled Meds: . atorvastatin  20 mg Oral QHS  . furosemide  40 mg Intravenous Once  . metoprolol succinate  50 mg Oral Daily  . pantoprazole  40 mg Oral BID AC   Continuous Infusions:   LOS: 7 days   Kerney Elbe, DO [facility] PAGER is on AMION  If 7PM-7AM, please contact night-coverage [email or web address]

## 2020-05-09 NOTE — Care Management Important Message (Signed)
Important Message  Patient Details  Name: Shelley Flores MRN: [id number] Date of Birth: 1933/01/05   Medicare Important Message Given:  Yes     Orbie Pyo 05/09/2020, 3:21 PM

## 2020-05-09 NOTE — Telephone Encounter (Signed)
Pt is currently admitted at [facility] as she was admitted 12/20.  So appt will show up on discharge summary once she is discharged from hospital, will schedule a hospital follow up appt for pt with Dr. Valeta Harms.  appt has been scheduled for pt with Dr. Valeta Harms on Monday, 05/22/20 at 2pm. If appt needs to be cancelled/rescheduled, pt can call office to get appt changed. Nothing further needed.

## 2020-05-09 NOTE — Plan of Care (Signed)
  Problem: Education: Goal: Knowledge of General Education information will improve Description: Including pain rating scale, medication(s)/side effects and non-pharmacologic comfort measures Outcome: Progressing   Problem: Clinical Measurements: Goal: Ability to maintain clinical measurements within normal limits will improve Outcome: Progressing Goal: Diagnostic test results will improve Outcome: Not Progressing Note: Awaiting Pleurix cath to placed

## 2020-05-09 NOTE — Plan of Care (Signed)
  Problem: Education: Goal: Knowledge of General Education information will improve Description: Including pain rating scale, medication(s)/side effects and non-pharmacologic comfort measures Outcome: Progressing   Problem: Health Behavior/Discharge Planning: Goal: Ability to manage health-related needs will improve Outcome: Progressing   Problem: Clinical Measurements: Goal: Ability to maintain clinical measurements within normal limits will improve Outcome: Progressing   Problem: Clinical Measurements: Goal: Will remain free from infection Outcome: Progressing   Problem: Clinical Measurements: Goal: Diagnostic test results will improve Outcome: Progressing   Problem: Clinical Measurements: Goal: Respiratory complications will improve Outcome: Progressing   Problem: Clinical Measurements: Goal: Cardiovascular complication will be avoided Outcome: Progressing   Problem: Activity: Goal: Risk for activity intolerance will decrease Outcome: Progressing   Problem: Nutrition: Goal: Adequate nutrition will be maintained Outcome: Progressing   Problem: Elimination: Goal: Will not experience complications related to bowel motility Outcome: Progressing   Problem: Safety: Goal: Ability to remain free from injury will improve Outcome: Progressing   Problem: Skin Integrity: Goal: Risk for impaired skin integrity will decrease Outcome: Progressing

## 2020-05-09 NOTE — Evaluation (Addendum)
Physical Therapy Evaluation Patient Details Name: Shelley Flores MRN: [id number] DOB: 1932/08/26 Today's Date: 05/09/2020   History of Present Illness  86 yo female with onset of persistent cough was initially treated for reflux and cough was not relieved.  Now SOB and having O2 needs, will be followed for hilar mass.  Has leukocytosis, abd distension, hyponatremia, low K+.  PMHx:  ALF resident, HTN, HLD, mild valvular heart disease, smoker, abdominal bruit, LE edema, heart murmur, OA, osteoporosis, LBP, GERD,  Clinical Impression  Pt was seen for mobility on RW with room air check and the following result:SATURATION QUALIFICATIONS: (This note is used to comply with regulatory documentation for home oxygen)  Patient Saturations on Room Air at Rest = 94%  Patient Saturations on Room Air while Ambulating = 87%%  Patient Saturations on 1.5 Liters of oxygen while Ambulating = 94%  Please briefly explain why patient needs home oxygen:  Cannot currently maintain 88% or better with non-O2 supplemented walk. Requires O2 to maintain comfortable sats.    Follow Up Recommendations SNF    Equipment Recommendations  Rolling walker with 5" wheels    Recommendations for Other Services       Precautions / Restrictions Precautions Precautions: Fall Precaution Comments: monitor O2 sats Restrictions Weight Bearing Restrictions: No      Mobility  Bed Mobility Overal bed mobility: Modified Independent                  Transfers Overall transfer level: Needs assistance Equipment used: Rolling walker (2 wheeled);1 person hand held assist Transfers: Sit to/from Stand Sit to Stand: Min guard;Min assist         General transfer comment: min assist with support to power up unless using railing and then Mod I  Ambulation/Gait Ambulation/Gait assistance: Min guard Gait Distance (Feet): 140 Feet (30+30+80) Assistive device: Rolling walker (2 wheeled);1 person hand held assist Gait  Pattern/deviations: Step-through pattern;Decreased stride length;Wide base of support Gait velocity: reduced Gait velocity interpretation: <1.31 ft/sec, indicative of household ambulator General Gait Details: pt is up to walk with O2 off and note on last leg of walk down to 87% with no O2.  Stairs            Wheelchair Mobility    Modified Rankin (Stroke Patients Only)       Balance Overall balance assessment: Needs assistance Sitting-balance support: Feet supported Sitting balance-Leahy Scale: Fair     Standing balance support: Bilateral upper extremity supported;During functional activity Standing balance-Leahy Scale: Poor                               Pertinent Vitals/Pain Pain Assessment: No/denies pain    Home Living Family/patient expects to be discharged to:: Other (Comment) ([facility] [place])                      Prior Function Level of Independence: Independent         Comments: walking on hallways at ALF     Hand Dominance   Dominant Hand: Right    Extremity/Trunk Assessment   Upper Extremity Assessment Upper Extremity Assessment: Overall WFL for tasks assessed    Lower Extremity Assessment Lower Extremity Assessment: Overall WFL for tasks assessed (weak L hams and B hip abd)    Cervical / Trunk Assessment Cervical / Trunk Assessment: Kyphotic (mild, LBP history)  Communication   Communication: No difficulties  Cognition Arousal/Alertness: Awake/alert Behavior  During Therapy: WFL for tasks assessed/performed Overall Cognitive Status: Within Functional Limits for tasks assessed                                        General Comments General comments (skin integrity, edema, etc.): pt was seen for mobility on RW with O2 off and was 94% sat baseline, but after total of 140' gait was 87%.  Improved with quick rest and replaced cannula since 90% was best recovery after that    Exercises     Assessment/Plan     PT Assessment Patient needs continued PT services  PT Problem List Decreased strength;Decreased balance;Cardiopulmonary status limiting activity       PT Treatment Interventions DME instruction;Gait training;Functional mobility training;Therapeutic activities;Therapeutic exercise;Balance training;Neuromuscular re-education;Patient/family education    PT Goals (Current goals can be found in the Care Plan section)  Acute Rehab PT Goals Patient Stated Goal: to get home and feel better PT Goal Formulation: With patient Time For Goal Achievement: 05/23/20 Potential to Achieve Goals: Good    Frequency Min 3X/week   Barriers to discharge Decreased caregiver support home in [place] alone    Co-evaluation               AM-PAC PT "6 Clicks" Mobility  Outcome Measure Help needed turning from your back to your side while in a flat bed without using bedrails?: None Help needed moving from lying on your back to sitting on the side of a flat bed without using bedrails?: None Help needed moving to and from a bed to a chair (including a wheelchair)?: None Help needed standing up from a chair using your arms (e.g., wheelchair or bedside chair)?: A Little Help needed to walk in hospital room?: A Little Help needed climbing 3-5 steps with a railing? : A Lot 6 Click Score: 20    End of Session Equipment Utilized During Treatment: Gait belt;Oxygen Activity Tolerance: Patient tolerated treatment well;Treatment limited secondary to medical complications (Comment) Patient left: in bed;with call bell/phone within reach;with bed alarm set Nurse Communication: Mobility status PT Visit Diagnosis: Unsteadiness on feet (R26.81);Muscle weakness (generalized) (M62.81)    Time: 0762-2633 PT Time Calculation (min) (ACUTE ONLY): 23 min   Charges:   PT Evaluation $PT Eval Moderate Complexity: 1 Mod PT Treatments $Gait Training: 8-22 mins       Ramond Dial 05/09/2020, 1:45 PM  Mee Hives, PT  MS Acute Rehab Dept. Number: [place] [facility] [place]

## 2020-05-09 NOTE — TOC Progression Note (Signed)
Transition of Care [facility]) - Progression Note    Patient Details  Name: Shelley Flores MRN: [id number] Date of Birth: August 27, 1932  Transition of Care [facility]) CM/SW Contact  Angelita Ingles, RN Phone Number: [phone]   05/09/2020, 9:35 AM  Clinical Narrative:    CM received call back from [place] [facility] states that the facility should be able to accommodate patient at SNF. Facility will also be able to maintain the pleurex catheter but will need advanced notice of the exact supplies needed in order to order and have supplies on hand. PT/OT notes are also needed to initiate SNF auth. CM will follow for PT notes and recommendations.        Expected Discharge Plan and Services                                                 Social Determinants of Health (SDOH) Interventions    Readmission Risk Interventions No flowsheet data found.

## 2020-05-09 NOTE — NC FL2 (Addendum)
[place] MEDICAID FL2 LEVEL OF CARE SCREENING TOOL     IDENTIFICATION  Patient Name: Shelley Flores Birthdate: [birth year] Sex: female Admission Date (Current Location): 05/01/2020  [facility] and [place] Number:  Herbalist and Address:  [facility], [place] [address], [place], [place] [address]      Provider Number: [id number]  Attending Physician Name and Address:  Kerney Elbe, DO  Relative Name and Phone Number:  Terance Hart [phone]    Current Level of Care: Hospital Recommended Level of Care: [place] Prior Approval Number:    Date Approved/Denied:   PASRR Number:  [id number]  Discharge Plan: SNF    Current Diagnoses: Patient Active Problem List   Diagnosis Date Noted  . Pleural effusion 05/01/2020  . Acute hypoxemic respiratory failure ([place]) 05/01/2020  . Hyponatremia 05/01/2020  . Hypokalemia 05/01/2020  . PVC's (premature ventricular contractions) 02/26/2016  . Essential hypertension 12/29/2012  . Hyperlipidemia 12/29/2012  . Murmur 12/29/2012    Orientation RESPIRATION BLADDER Height & Weight     Self,Time,Situation,Place  Normal Continent Weight: 64.7 kg Height:  5\' 4"  (162.6 cm)  BEHAVIORAL SYMPTOMS/MOOD NEUROLOGICAL BOWEL NUTRITION STATUS     (n/a) Continent Diet  AMBULATORY STATUS COMMUNICATION OF NEEDS Skin   Supervision Verbally Normal                       Personal Care Assistance Level of Assistance  Bathing,Feeding,Dressing,Total care Bathing Assistance: Limited assistance Feeding assistance: Independent Dressing Assistance: Limited assistance Total Care Assistance: Limited assistance   Functional Limitations Info  Sight,Hearing,Speech Sight Info: Adequate Hearing Info: Adequate Speech Info: Adequate    SPECIAL CARE FACTORS FREQUENCY  PT (By licensed PT),OT (By licensed OT)     PT Frequency: 5X OT Frequency: 5X            Contractures Contractures Info:  Not present    Additional Factors Info  Code Status,Allergies,Psychotropic,Insulin Sliding Scale,Isolation Precautions,Suctioning Needs Code Status Info: DNR Allergies Info: Irbesartan Psychotropic Info: n/a Insulin Sliding Scale Info: see d/c summary for sliding scale info Isolation Precautions Info: none Suctioning Needs: n/a   Current Medications (05/09/2020):  This is the current hospital active medication list Current Facility-Administered Medications  Medication Dose Route Frequency Provider Last Rate Last Admin  . acetaminophen (TYLENOL) tablet 650 mg  650 mg Oral Q6H PRN [name], MD       Or  . acetaminophen (TYLENOL) suppository 650 mg  650 mg Rectal Q6H PRN [name], MD      . atorvastatin (LIPITOR) tablet 20 mg  20 mg Oral QHS [name], MD   20 mg at 05/08/20 2046  . furosemide (LASIX) injection 40 mg  40 mg Intravenous Once [name], MD      . guaiFENesin-dextromethorphan (ROBITUSSIN DM) 100-10 MG/5ML syrup 5 mL  5 mL Oral Q4H PRN [name], MD   5 mL at 05/08/20 2047  . lidocaine (XYLOCAINE) 1 % (with pres) injection    PRN Louk, Alexandra M, PA-C   10 mL at 05/04/20 1408  . metoprolol succinate (TOPROL-XL) 24 hr tablet 50 mg  50 mg Oral Daily [name], MD   50 mg at 05/09/20 0921  . pantoprazole (PROTONIX) EC tablet 40 mg  40 mg Oral BID Thomes Lolling, MD   40 mg at 05/09/20 1443     Discharge Medications: Please see discharge summary for a list of discharge medications.  Relevant Imaging Results:  Relevant Lab Results:   Additional Information [phone]  Angelita Ingles, RN

## 2020-05-10 ENCOUNTER — Inpatient Hospital Stay (HOSPITAL_COMMUNITY): Payer: Medicare PPO

## 2020-05-10 DIAGNOSIS — J910 Malignant pleural effusion: Secondary | ICD-10-CM

## 2020-05-10 DIAGNOSIS — E876 Hypokalemia: Secondary | ICD-10-CM | POA: Diagnosis not present

## 2020-05-10 DIAGNOSIS — J90 Pleural effusion, not elsewhere classified: Secondary | ICD-10-CM | POA: Diagnosis not present

## 2020-05-10 DIAGNOSIS — J9601 Acute respiratory failure with hypoxia: Secondary | ICD-10-CM | POA: Diagnosis not present

## 2020-05-10 DIAGNOSIS — I10 Essential (primary) hypertension: Secondary | ICD-10-CM | POA: Diagnosis not present

## 2020-05-10 DIAGNOSIS — C3492 Malignant neoplasm of unspecified part of left bronchus or lung: Secondary | ICD-10-CM

## 2020-05-10 HISTORY — PX: IR PERC PLEURAL DRAIN W/INDWELL CATH W/IMG GUIDE: IMG5383

## 2020-05-10 LAB — QUANTIFERON-TB GOLD PLUS (RQFGPL)
QuantiFERON Mitogen Value: 0.5 IU/mL
QuantiFERON Nil Value: 0.03 IU/mL
QuantiFERON TB1 Ag Value: 0 IU/mL
QuantiFERON TB2 Ag Value: 0.09 IU/mL

## 2020-05-10 LAB — COMPREHENSIVE METABOLIC PANEL
ALT: 20 U/L (ref 0–44)
AST: 24 U/L (ref 15–41)
Albumin: 2.5 g/dL — ABNORMAL LOW (ref 3.5–5.0)
Alkaline Phosphatase: 54 U/L (ref 38–126)
Anion gap: 9 (ref 5–15)
BUN: 11 mg/dL (ref 8–23)
CO2: 23 mmol/L (ref 22–32)
Calcium: 9.8 mg/dL (ref 8.9–10.3)
Chloride: 105 mmol/L (ref 98–111)
Creatinine, Ser: 0.77 mg/dL (ref 0.44–1.00)
GFR, Estimated: >60 mL/min (ref >60)
Glucose, Bld: 112 mg/dL — ABNORMAL HIGH (ref 70–99)
Potassium: 4.2 mmol/L (ref 3.5–5.1)
Sodium: 137 mmol/L (ref 135–145)
Total Bilirubin: 0.6 mg/dL (ref 0.3–1.2)
Total Protein: 5.2 g/dL — ABNORMAL LOW (ref 6.5–8.1)

## 2020-05-10 LAB — CBC WITH DIFFERENTIAL/PLATELET
Abs Immature Granulocytes: 0.07 10*3/uL (ref 0.00–0.07)
Basophils Absolute: 0.1 10*3/uL (ref 0.0–0.1)
Basophils Relative: 0 %
Eosinophils Absolute: 0.2 10*3/uL (ref 0.0–0.5)
Eosinophils Relative: 2 %
HCT: 43.1 % (ref 36.0–46.0)
Hemoglobin: 14.2 g/dL (ref 12.0–15.0)
Immature Granulocytes: 1 %
Lymphocytes Relative: 10 %
Lymphs Abs: 1.2 10*3/uL (ref 0.7–4.0)
MCH: 29.3 pg (ref 26.0–34.0)
MCHC: 32.9 g/dL (ref 30.0–36.0)
MCV: 89 fL (ref 80.0–100.0)
Monocytes Absolute: 1.1 10*3/uL — ABNORMAL HIGH (ref 0.1–1.0)
Monocytes Relative: 9 %
Neutro Abs: 9.5 10*3/uL — ABNORMAL HIGH (ref 1.7–7.7)
Neutrophils Relative %: 78 %
Platelets: 251 10*3/uL (ref 150–400)
RBC: 4.84 MIL/uL (ref 3.87–5.11)
RDW: 14.9 % (ref 11.5–15.5)
WBC: 12.2 10*3/uL — ABNORMAL HIGH (ref 4.0–10.5)
nRBC: 0 % (ref 0.0–0.2)

## 2020-05-10 LAB — CHOLESTEROL, BODY FLUID: Cholesterol, Fluid: 51 mg/dL

## 2020-05-10 LAB — QUANTIFERON-TB GOLD PLUS: QuantiFERON-TB Gold Plus: UNDETERMINED — AB

## 2020-05-10 LAB — PHOSPHORUS: Phosphorus: 3 mg/dL (ref 2.5–4.6)

## 2020-05-10 LAB — MAGNESIUM: Magnesium: 2.1 mg/dL (ref 1.7–2.4)

## 2020-05-10 MED ORDER — FENTANYL CITRATE (PF) 100 MCG/2ML IJ SOLN
INTRAMUSCULAR | Status: AC
  Filled 2020-05-10: qty 2

## 2020-05-10 MED ORDER — LIDOCAINE HCL 1 % IJ SOLN
INTRAMUSCULAR | Status: AC
  Filled 2020-05-10: qty 20

## 2020-05-10 MED ORDER — CEFAZOLIN SODIUM-DEXTROSE 2-4 GM/100ML-% IV SOLN
2.0000 g | INTRAVENOUS | Status: AC
  Filled 2020-05-10: qty 100

## 2020-05-10 MED ORDER — MIDAZOLAM HCL 2 MG/2ML IJ SOLN
INTRAMUSCULAR | Status: AC | PRN
  Administered 2020-05-10 (×2): 0.5 mg via INTRAVENOUS

## 2020-05-10 MED ORDER — OXYCODONE HCL 5 MG PO TABS
5.0000 mg | ORAL_TABLET | Freq: Four times a day (QID) | ORAL | Status: DC | PRN
  Filled 2020-05-10: qty 1

## 2020-05-10 MED ORDER — LIDOCAINE HCL (PF) 1 % IJ SOLN
INTRAMUSCULAR | Status: AC | PRN
Start: 2020-05-10 — End: 2020-05-10
  Administered 2020-05-10: 10 mL

## 2020-05-10 MED ORDER — MIDAZOLAM HCL 2 MG/2ML IJ SOLN
INTRAMUSCULAR | Status: AC
  Filled 2020-05-10: qty 2

## 2020-05-10 MED ORDER — FENTANYL CITRATE (PF) 100 MCG/2ML IJ SOLN
INTRAMUSCULAR | Status: AC | PRN
  Administered 2020-05-10 (×2): 25 ug via INTRAVENOUS

## 2020-05-10 MED ORDER — CEFAZOLIN SODIUM-DEXTROSE 2-4 GM/100ML-% IV SOLN
INTRAVENOUS | Status: AC
  Administered 2020-05-10: 2 g via INTRAVENOUS
  Filled 2020-05-10: qty 100

## 2020-05-10 MED ORDER — MORPHINE SULFATE (PF) 2 MG/ML IV SOLN: 2.0000 mg | INTRAVENOUS | Status: DC | PRN

## 2020-05-10 NOTE — TOC Progression Note (Addendum)
Transition of Care [facility]) - Progression Note    Patient Details  Name: Shelley Flores MRN: [id number] Date of Birth: 1932-12-05  Transition [facility] Care [facility]) CM/SW [place], RN Phone Number: [phone]  05/10/2020, 3:18 PM  Clinical Narrative:    Insurance Auth received Auth ID # [id number] good for 6 days start date 05/10/20 next review 05/15/20. Fax clinicals to [employer] @ [phone]. Per MD patient is not medically ready today will monitor for chest discomfort after procedure. SNF is attempting to obtain pleurx drainage supplies.   1530 Spoke with [employer] from [facility] who confirms that the  facility is making sure they have the needed supplies before accepting the patient. SNF will need the discharge summary to have MD orders for care, site of drain and frequency of drains.        Expected Discharge Plan and Services                                                 Social Determinants of Health (SDOH) Interventions    Readmission Risk Interventions No flowsheet data found.

## 2020-05-10 NOTE — TOC Progression Note (Signed)
Transition of Care [facility]) - Progression Note    Patient Details  Name: Shelley Flores MRN: [id number] Date of Birth: 16-Jan-1933  Transition of Care [facility]) CM/SW [place], RN Phone Number: [phone]  05/10/2020, 2:09 PM  Clinical Narrative:    Insurance authorization initiated ref # [id number] clinicals faxed to [phone]        Expected Discharge Plan and Services                                                 Social Determinants of Health (SDOH) Interventions    Readmission Risk Interventions No flowsheet data found.

## 2020-05-10 NOTE — Progress Notes (Signed)
PROGRESS NOTE    Shelley Flores  IOX:[id number] DOB: November 07, 1932 DOA: 05/01/2020 PCP: Cari Caraway, MD   Brief Narrative:  HPI per Dr. [name] on 05/01/20  HPI: Shelley Flores is a 86 y.o. female with medical history significant of hypertension, hyperlipidemia, mild valvular heart disease presenting with complaints of shortness of breath.  Patient states she has been coughing since the end of October and was seen by her doctor who thought the cough was related to her having acid reflux so she was started on Prilosec but her cough is persisted.  For the past 10 days she has had dyspnea on exertion and fatigue.  She had an x-ray done on 12/17 which revealed a large left pleural effusion.  She was supposed to get IR thoracentesis but it has not been scheduled yet.  Since her dyspnea was worsening she came into the ED today to be evaluated.  Denies fevers and states she has been fully vaccinated against Covid.  Denies chest pain.  Denies prior history of pleural effusion/thoracentesis.  Denies history of malignancy.  She smoked cigarettes for about 4 years back in her 68s when she was in graduate school but not since then.  Denies unintentional weight loss.  Reports having abdominal distention for several months.  Also complaining of early satiety/loss of appetite.  No other complaints.  ED Course: Afebrile.  Not tachycardic.  Tachypneic with respiratory rate in the 20s.  Blood pressure elevated with systolic up to 268T.  WBC 13.4, hemoglobin 15.8, hematocrit 45.4, platelet 473K.  Sodium 131, potassium 2.9, chloride 93, bicarb 25, BUN 15, creatinine 0.7, glucose 125.  Magnesium 2.2.  BNP 70.  SARS-CoV-2 PCR test and influenza panel both negative.  Blood culture x2 pending.  Chest x-ray showing a large left pleural effusion with subtotal atelectasis of the left lung and mediastinal shift left to right, little change since 04/28/2020.  Underwent thoracentesis for large left pleural effusion  in the ED and 1500 cc fluid removed.  Repeat chest x-ray showing interval decrease in size of a now small to moderate volume left pleural effusion; underlying infection, inflammation, pulmonary mass not excluded; trace right pleural effusion.  Not hypoxic at rest but sats dropped to 90% with ambulation.  She was given ceftriaxone and azithromycin.  Also given oral potassium 40 mEq for hypokalemia.  Admission requested for observation.  **Interim History  Patient underwent a left-sided thoracentesis x2 and drained 1.5 L of suggestive of an exudative effusion each time.  She was recently seen in October by her doctor for dyspnea and found to have a pleural effusion on admission. She was started on IV ceftriaxone and azithromycin and pulmonary is consulted for further evaluation recommendations given her very large pleural effusion. Cytology shows Malignant cells and patient has Adenocarcinoma. Currently awaiting Plure-X Catheter placement and after will discharge to SNF as PT recommends SNF now and she will follow up with Pulmonary on 05/23/19 and Medical Oncology on 05/18/19. LE Venous Duplex negative for DVT. Repeat CT Scan showed "Left upper lobe masslike consolidation consistent with malignancy. A portion of the mass invades the mediastinum at the level of the aortic or pulmonary window. PET-CT may be useful to delineate the mass from the postobstructive change. Prominence of the left upper lobe pulmonary interstitium, concerning for lymphangitic spread of disease. A subcarinal lymphadenopathy worrisome for nodal metastasis. Again, PET-CT may be useful. Small bilateral pleural effusions, partially loculated on the left. Decreased left effusion after recent thoracentesis. Aortic Atherosclerosis."  She is getting close to being discharged and she will have a Pleurx catheter placed today.  Supplies will be ordered for facility and once this is placed she will have some drainage.  She will likely need to go to her  skilled nursing facility with oxygen and once pleural effusion continues to improve she may be able to be weaned off of it.  Assessment & Plan:   Principal Problem:   Pleural effusion Active Problems:   Essential hypertension   Acute hypoxemic respiratory failure (HCC)   Hyponatremia   Hypokalemia  Acute Respiratory Failure with Hypoxia secondary to a Large Left Pleural Effusion  -Patient presenting with complaints of dyspnea.   -She had a chest x-ray done on 12/17 which revealed a large left pleural effusion.  -She was supposed to get IR thoracentesis but it was not scheduled as an outpatient.   -Chest x-ray done on admission and was showing a large left pleural effusion with subtotal atelectasis of the left lung and mediastinal shift left to right, little change since 04/28/2020.   -Underwent thoracentesis for large left pleural effusion in the ED and 1500 cc fluid removed.   -Repeat chest x-ray 05/02/20 showing interval decrease in size of a now small to moderate volume left pleural effusion; underlying infection, inflammation, pulmonary mass not excluded; trace right pleural effusion.   -Echo done in November 2019 showing normal LVEF of 50-09%, grade 1 diastolic dysfunction, and no significant valvular disease. BNP normal.   -Based on pleural fluid analysis labs, pleural fluid/serum protein ratio 0.60 and pleural fluid/serum LDH ratio of 0.87 consistent with an exudative effusion.  -?Parapneumonic effusion versus possible underlying malignancy.  -Pleural fluid glucose 101 and WBC count normal.  SARS-CoV-2 PCR test negative.   -On admission she had a Mild leukocytosis on labs but no fever, tachycardia, or hypotension to suggest sepsis. Not hypoxic at rest but sats dropped to 90% with ambulation in the ED. 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 Tresa Moore in Pathology), and pleural fluid  culture showed NGTD at 5Days. -Abx now stopped  -CT Scan ordered for further evaluation and showed "Moderate left and small right pleural effusions. The left pleural effusion demonstrates areas of conspicuous pleural thickening and small pleural nodularity. While this could reflect empyema, a malignant effusion with pleural deposits would present similarly. Furthermore, there is heterogeneous enhancement of the adjacent atelectatic lung parenchyma including within the lingula, anterior segment left upper lobe and anterior basal segment of the left lower lobe. Could reflect underlying airspace disease or malignancy with additional airspace disease seen throughout aerated portions of the left lung. Asymmetric soft tissue edema across the left chest wall. No clear extension of the pleural or airspace processes in to the chest wall proper. Possibly reactive though should with exam findings and continued attention on follow-up imaging is recommended. Some questionable thickening of the rectosigmoid though may be related to underdistention though some mild perirectal fat stranding is present however and could suggest an underlying proctitis. Aortic Atherosclerosis."  -Blood culture x2 Showed NGTD 5 Days.  Continue to monitor WBC count as it is fluctuating and went from 13.4 -> 14.7 -> 11.5 -> 13.9 -> 11.5 -> 13.3 -> 12.7 -> 11.6 -> 12.2 -Check procalcitonin level and was <0.10. Abx now stopped -SpO2: 95 % O2 Flow Rate (L/min): 4 L/min; Now on and off of Supplemental O2 and will require it at D/C  -Continue  supplemental oxygen via nasal cannula and wean O2 as tolerated -Continuous pulse Oximetry and Maintain oxygen saturation above 92%.   -ECHO done as below and showing EF of 60-65% and G1DD -Will consult Pulmonary for further evaluation and Recommendations and they are recommending a thoracentesis today.  Thoracentesis was done and she had 1.55 Liters removed again  -Repeat CXR showed "Extensive consolidation  left lower lobe with mild progression. Left  Pleural effusion not well visualized due to consolidation. Mild right lower lobe airspace disease unchanged." -Pulmonary to weigh in further and have recommended repeating a CT Scan of the Chest with Contrast and recommended calling [facility] to evaluate for VATS Pleurodesis vs Pleur-X Catheter Placement ; Will obtain LE Venous Duplex and this was Negative for DVT  -I spoke with Dr. Kipp Brood of [facility] and he recommends IR to place a Pleur-X Catheter; Dr. Rodena Piety spoke with IR and apparently IR will not place the Pleurx catheter until she is close to being discharged and has a disposition in place in terms of going to SNF.  Recommendation is for SNF and will have IR anticipate placing Pleurx cath the next few days; Timing of Pluer-X to be done by IR -I spoke with Dr. Kathlene Cote yesterday and Pleurx catheter will be placed today on 05/10/2020 -Repeat CT scan of the chest showed "Left upper lobe masslike consolidation consistent with malignancy. A portion of the mass invades the mediastinum at the level of the aortic or pulmonary window. PET-CT may be useful to delineate the mass from the postobstructive change. 2. Prominence of the left upper lobe pulmonary interstitium, concerning for lymphangitic spread of disease. 3. A subcarinal lymphadenopathy worrisome for nodal metastasis. Again, PET-CT may be useful. 4. Small bilateral pleural effusions, partially loculated on the left. Decreased left effusion after recent thoracentesis. 5.  Aortic Atherosclerosis." -Because of this she also underwent a CT of the abdomen and pelvis with contrast to evaluate for Metastatsis which showed "No definite evidence of malignancy or metastatic disease in the abdomen or pelvis. Examination is generally unchanged compared to recent CT dated 05/02/2020.  No evidence of urologic malignancy per ordering indication. Definitively benign bilateral renal cysts for which no further characterization or  follow-up is specifically required. Multiple subcentimeter low-attenuation lesions of the liver, generally too small to confidently characterize, although the largest are clearly simple cysts, remaining smaller lesions almost certainly incidental small cysts or hemangiomata. No findings suspicious for hepatic metastatic disease. There is redemonstrated mild rectal wall thickening and minimal perirectal fat stranding, again suggesting nonspecific proctitis. Correlate with referable signs and symptoms if present. Synchronous rectal malignancy would be difficult to strictly exclude. Consider digital and/or endoscopic examination. There is an unchanged 1.1 cm fluid low-attenuation lesion of the pancreatic tail. 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 There is no pancreatic ductal dilatation. Given new diagnosis of advanced stage lung malignancy, recommend attention on follow-up with future clinically indicated oncology imaging.  Small, left greater than right bilateral pleural effusions, similar to prior examination. Nodular thickening of the dependent left-sided parietal pleura. Findings are consistent with pleural metastatic disease as diagnosed by thoracentesis" -I also spoke with Dr. Benay Spice of Oncology who recommends outpatient follow up and patient will need Molecular Testing and additional workup including PET Scan; outpatient appointment with Dr. Julien Nordmann as already been scheduled for May 17, 2020 as well as with pulmonary Dr. Valeta Harms on May 22, 2020  Abdominal Distention and Early Satiety, in the setting of her malignancy as above -Patient reports history of  abdominal distention for several months and also complaining of early satiety/loss of appetite.   -No complains of abdominal pain, nausea, vomiting.  Abdomen does appear significantly distended on exam but nontender. ?Abdominal mass/ ascites. -Repeat CT Scan as above  -C/w PPI Pantoprazole 40 mg po Daily -Evaluated for Mass  Bilateral  Lower Extremity Edema -BNP normal.  She was previously on amlodipine which was stopped during cardiology visit last month. -Has 1-2+ mildly pitting edema of bilateral lower extremities but mostly non-pitting. -Needs some more IV Lasix -ECHOCardiogram showed Grade 1 DD and normal EF of 60-65% -Could be in the setting of poor Nutritional Status and Hypoalbuminemia  -Will obtain LE Venous Duplex to r/o DVT and was Negative   Hyponatremia -Mild -Sodium 131 on admission and is now improved to 137 -Possibly due to hypervolemia or could be due to home thiazide diuretic use. -May give some more IV Lasix -Check serum osmolarity and repeat CMP in the AM   Hypokalemia -Potassium 2.9 on admission and improved to 4.2 -Likely related to home diuretic use.   -Continue to Monitor and Replete as Necessary -Repeat CMP in the AM   Hypertension -Stable.  -Amlodipine was discontinued during recent cardiology visit due to patient having worsening lower extremity edema and will continue to Hold -Continue Metoprolol Succinate 50 mg po daily -Continue to Monitor BP per Protocol -Last BP reading was 144/69  Hyperlipidemia -C/w Atorvastatin 20 mg po daily   PVCs -C/w telemetry Monitoring -C/w Metoprolol Succinate 50 mg po Daily  Hypophosphatemia -Patient's Phos Level was 3.0  -Continue to Monitor and Replete as Necessary -Repeat Phos Level in the AM    Left Arm IV Infiltration r/o Phlebitis  -Warm Compresses -Area was erythematous and slightly boggy -If persists will get an U/S of the ARM  -Elevation of the Extremity   GERD -Continue PPI with Prilosec Substitution of po Pantoprazole 40 mg po BID daily   DVT prophylaxis: Enoxaparin 40 mg sq q24h Code Status: DO NOT RESUSCITATE Family Communication: No family present at bedside  Disposition Plan: Pending further improvement and workup and evaluation and clearance by Pulmonary and IR; Anticipating D/C to SNF soon   Status is:  Inpatient  Remains inpatient appropriate because:Unsafe d/c plan, IV treatments appropriate due to intensity of illness or inability to take PO and Inpatient level of care appropriate due to severity of illness   Dispo: The patient is from: Home              Anticipated d/c is to: TBD              Anticipated d/c date is: 1-2 days              Patient currently is not medically stable to d/c.  Consultants:   Interventional Radiology  Pulmonary/Critical Care  Discussed with Dr. Melodie Bouillon of [facility]  Discussed with Dr. Benay Spice of Medical Oncology   Procedures:  Thoracentesis 05/01/20 removed 1500 mL   Thoracentesis 05/04/20 removed 1.55 Liters of Hazy Amber Fluid    Plure-X Catheter Placement on 05/10/20  ECHOCARDIOGRAM IMPRESSIONS    1. Left ventricular ejection fraction, by estimation, is 60 to 65%. The  left ventricle has normal function. The left ventricle has no regional  wall motion abnormalities. Left ventricular diastolic parameters are  consistent with Grade I diastolic  dysfunction (impaired relaxation). Elevated left ventricular end-diastolic  pressure.  2. Right ventricular systolic function is normal. The right ventricular  size is normal.  3. The  mitral valve is degenerative. No evidence of mitral valve  regurgitation. No evidence of mitral stenosis. Moderate mitral annular  calcification.  4. The aortic valve is normal in structure. Aortic valve regurgitation is  not visualized. No aortic stenosis is present.  5. The inferior vena cava is normal in size with greater than 50%  respiratory variability, suggesting right atrial pressure of 3 mmHg.   FINDINGS  Left Ventricle: Left ventricular ejection fraction, by estimation, is 60  to 65%. The left ventricle has normal function. The left ventricle has no  regional wall motion abnormalities. The left ventricular internal cavity  size was normal in size. There is  no left ventricular hypertrophy.  Left ventricular diastolic parameters  are consistent with Grade I diastolic dysfunction (impaired relaxation).  Elevated left ventricular end-diastolic pressure.   Right Ventricle: The right ventricular size is normal. No increase in  right ventricular wall thickness. Right ventricular systolic function is  normal.   Left Atrium: Left atrial size was normal in size.   Right Atrium: Right atrial size was normal in size.   Pericardium: There is no evidence of pericardial effusion.   Mitral Valve: The mitral valve is degenerative in appearance. Moderate  mitral annular calcification. No evidence of mitral valve regurgitation.  No evidence of mitral valve stenosis.   Tricuspid Valve: The tricuspid valve is normal in structure. Tricuspid  valve regurgitation is not demonstrated. No evidence of tricuspid  stenosis.   Aortic Valve: The aortic valve is normal in structure. Aortic valve  regurgitation is not visualized. No aortic stenosis is present.   Pulmonic Valve: The pulmonic valve was normal in structure. Pulmonic valve  regurgitation is not visualized. No evidence of pulmonic stenosis.   Aorta: The aortic root is normal in size and structure.   Venous: The inferior vena cava is normal in size with greater than 50%  respiratory variability, suggesting right atrial pressure of 3 mmHg.   IAS/Shunts: No atrial level shunt detected by color flow Doppler.     LEFT VENTRICLE  PLAX 2D  LVIDd:     3.30 cm Diastology  LVIDs:     1.60 cm LV e' medial:  4.03 cm/s  LV PW:     1.00 cm LV E/e' medial: 19.9  LV IVS:    1.00 cm LV e' lateral:  5.00 cm/s             LV E/e' lateral: 16.0     RIGHT VENTRICLE  RV Basal diam: 2.20 cm  RV S prime:   2.50 cm/s  TAPSE (M-mode): 2.0 cm   LEFT ATRIUM       Index    RIGHT ATRIUM     Index  LA Vol (A2C):  26.5 ml 15.64 ml/m RA Area:   8.27 cm  LA Vol (A4C):  23.7 ml 13.99 ml/m RA  Volume:  15.00 ml 8.85 ml/m  LA Biplane Vol: 26.8 ml 15.82 ml/m  AORTIC VALVE  LVOT Vmax:  120.00 cm/s  LVOT Vmean: 84.000 cm/s  LVOT VTI:  0.220 m    AORTA  Ao Root diam: 2.60 cm   MITRAL VALVE  MV Area (PHT): 3.60 cm   SHUNTS  MV Decel Time: 211 msec   Systemic VTI: 0.22 m  MV E velocity: 80.20 cm/s  MV A velocity: 127.00 cm/s  MV E/A ratio: 0.63   Antimicrobials:  Anti-infectives (From admission, onward)   Start     Dose/Rate Route Frequency Ordered Stop   05/10/20 1100  ceFAZolin (ANCEF) IVPB 2g/100 mL premix        2 g 200 mL/hr over 30 Minutes Intravenous To Radiology 05/10/20 1046 05/11/20 1100   05/03/20 1500  azithromycin (ZITHROMAX) tablet 500 mg  Status:  Discontinued        500 mg Oral Daily 05/03/20 1500 05/03/20 1714   05/02/20 2200  azithromycin (ZITHROMAX) 500 mg in sodium chloride 0.9 % 250 mL IVPB  Status:  Discontinued        500 mg 250 mL/hr over 60 Minutes Intravenous Every 24 hours 05/01/20 2354 05/03/20 1500   05/02/20 2200  cefTRIAXone (ROCEPHIN) 1 g in sodium chloride 0.9 % 100 mL IVPB  Status:  Discontinued        1 g 200 mL/hr over 30 Minutes Intravenous Every 24 hours 05/01/20 2354 05/03/20 1714   05/01/20 2300  cefTRIAXone (ROCEPHIN) 1 g in sodium chloride 0.9 % 100 mL IVPB        1 g 200 mL/hr over 30 Minutes Intravenous  Once 05/01/20 2255 05/01/20 2347   05/01/20 2300  azithromycin (ZITHROMAX) 500 mg in sodium chloride 0.9 % 250 mL IVPB        500 mg 250 mL/hr over 60 Minutes Intravenous  Once 05/01/20 2255 05/02/20 0054        Subjective: Seen and examined at bedside and remains still feels dyspneic but states that she has not been coughing as much today.  Going to go get her Pleurx catheter placed.  No nausea or vomiting.  Still feels fatigued.  Still wearing supplemental oxygen via nasal cannula.  Also states that she still continues to not have a very good appetite given her early satiety.  No other concerns or complaints at  this time.  Objective: Vitals:   05/10/20 1140 05/10/20 1145 05/10/20 1150 05/10/20 1155  BP: (!) 151/82 136/83 138/76 135/75  Pulse: 93 82 85 78  Resp: (!) 27 (!) 29 (!) 27 (!) 24  Temp:      TempSrc:      SpO2: 96% 96% 97% 95%  Weight:      Height:       No intake or output data in the 24 hours ending 05/10/20 1158 Filed Weights   05/03/20 0512  Weight: 64.7 kg   Examination: Physical Exam:  Constitutional: WN/WD elderly Caucasian female appears calm and will be more comfortable today and she is not coughing as much. Eyes: Lids and conjunctivae normal, sclerae anicteric  ENMT: External Ears, Nose appear normal. Grossly normal hearing. Neck: Appears normal, supple, no cervical masses, normal ROM, no appreciable thyromegaly: No JVD Respiratory: Diminished to auscultation bilaterally with coarse breath sounds and crackles and rhonchi on the left first compared to the right.  She is wearing supplemental oxygen via nasal cannula and is not tachypneic and is not coughing as much today. Cardiovascular: RRR, no murmurs / rubs / gallops. S1 and S2 auscultated.  Has nonpitting edema Abdomen: Soft, non-tender, non-distended. Bowel sounds positive.  GU: Deferred. Musculoskeletal: No clubbing / cyanosis of digits/nails. No joint deformity upper and lower extremities.  Skin: No rashes, lesions, ulcers on limited skin evaluation. No induration; Warm and dry.  Neurologic: CN 2-12 grossly intact with no focal deficits. Romberg sign and cerebellar reflexes not assessed.  Psychiatric: Normal judgment and insight. Alert and oriented x 3. Normal mood and appropriate affect.   Data Reviewed: I have personally reviewed following labs and imaging studies  CBC: Recent Labs  Lab 05/04/20 0300 05/05/20 0314  05/07/20 0144 05/08/20 1812 05/09/20 0319 05/10/20 0322  WBC 13.9* 11.5* 13.3* 12.7* 11.6* 12.2*  NEUTROABS 10.3* 8.6*  --   --  8.5* 9.5*  HGB 13.7 13.9 13.9 15.4* 14.0 14.2  HCT 40.0  40.7 39.2 44.2 43.3 43.1  MCV 86.4 86.8 86.2 87.2 90.8 89.0  PLT 346 349 276 283 248 638   Basic Metabolic Panel: Recent Labs  Lab 05/04/20 0300 05/05/20 0314 05/07/20 0144 05/09/20 0319 05/10/20 0322  NA 134* 137 135 136 137  K 3.7 3.4* 3.6 4.3 4.2  CL 104 105 102 106 105  CO2 23 23 25 26 23   GLUCOSE 108* 106* 118* 108* 112*  BUN 11 7* 9 8 11   CREATININE 0.79 0.69 0.90 0.79 0.77  CALCIUM 9.3 9.2 9.7 9.6 9.8  MG 2.1 2.1  --  2.1 2.1  PHOS 2.0* 3.6  --  3.1 3.0   GFR: Estimated Creatinine Clearance: 44.4 mL/min (by C-G formula based on SCr of 0.77 mg/dL). Liver Function Tests: Recent Labs  Lab 05/04/20 0300 05/05/20 0314 05/07/20 0144 05/09/20 0319 05/10/20 0322  AST 22 19 20 17 24   ALT 25 24 22 20 20   ALKPHOS 46 45 48 51 54  BILITOT 0.7 0.7 0.8 0.5 0.6  PROT 5.0* 5.0* 5.0* 5.1* 5.2*  ALBUMIN 2.5* 2.6* 2.4* 2.4* 2.5*   No results for input(s): LIPASE, AMYLASE in the last 168 hours. No results for input(s): AMMONIA in the last 168 hours. Coagulation Profile: No results for input(s): INR, PROTIME in the last 168 hours. Cardiac Enzymes: No results for input(s): CKTOTAL, CKMB, CKMBINDEX, TROPONINI in the last 168 hours. BNP (last 3 results) No results for input(s): PROBNP in the last 8760 hours. HbA1C: No results for input(s): HGBA1C in the last 72 hours. CBG: No results for input(s): GLUCAP in the last 168 hours. Lipid Profile: No results for input(s): CHOL, HDL, LDLCALC, TRIG, CHOLHDL, LDLDIRECT in the last 72 hours. Thyroid Function Tests: No results for input(s): TSH, T4TOTAL, FREET4, T3FREE, THYROIDAB in the last 72 hours. Anemia Panel: No results for input(s): VITAMINB12, FOLATE, FERRITIN, TIBC, IRON, RETICCTPCT in the last 72 hours. Sepsis Labs: No results for input(s): PROCALCITON, LATICACIDVEN in the last 168 hours.  Recent Results (from the past 240 hour(s))  Resp Panel by RT-PCR (Flu A&B, Covid) Nasopharyngeal Swab     Status: None   Collection  Time: 05/01/20  6:22 PM   Specimen: Nasopharyngeal Swab; Nasopharyngeal(NP) swabs in vial transport medium  Result Value Ref Range Status   SARS Coronavirus 2 by RT PCR NEGATIVE NEGATIVE Final    Comment: (NOTE) SARS-CoV-2 target nucleic acids are NOT DETECTED.  The SARS-CoV-2 RNA is generally detectable in upper respiratory specimens during the acute phase of infection. The lowest concentration of SARS-CoV-2 viral copies this assay can detect is 138 copies/mL. A negative result does not preclude SARS-Cov-2 infection and should not be used as the sole basis for treatment or other patient management decisions. A negative result may occur with  improper specimen collection/handling, submission of specimen other than nasopharyngeal swab, presence of viral mutation(s) within the areas targeted by this assay, and inadequate number of viral copies(<138 copies/mL). A negative result must be combined with clinical observations, patient history, and epidemiological information. The expected result is Negative.  Fact Sheet for Patients:  EntrepreneurPulse.com.au  Fact Sheet for Healthcare Providers:  IncredibleEmployment.be  This test is no t yet approved or cleared by the [place] FDA and  has been authorized for detection  and/or diagnosis of SARS-CoV-2 by FDA under an Emergency Use Authorization (EUA). This EUA will remain  in effect (meaning this test can be used) for the duration of the COVID-19 declaration under Section 564(b)(1) of the Act, 21 U.S.C.section 360bbb-3(b)(1), unless the authorization is terminated  or revoked sooner.       Influenza A by PCR NEGATIVE NEGATIVE Final   Influenza B by PCR NEGATIVE NEGATIVE Final    Comment: (NOTE) The Xpert Xpress SARS-CoV-2/FLU/RSV plus assay is intended as an aid in the diagnosis of influenza from Nasopharyngeal swab specimens and should not be used as a sole basis for treatment. Nasal washings  and aspirates are unacceptable for Xpert Xpress SARS-CoV-2/FLU/RSV testing.  Fact Sheet for Patients: EntrepreneurPulse.com.au  Fact Sheet for Healthcare Providers: IncredibleEmployment.be  This test is not yet approved or cleared by the [place] FDA and has been authorized for detection and/or diagnosis of SARS-CoV-2 by FDA under an Emergency Use Authorization (EUA). This EUA will remain in effect (meaning this test can be used) for the duration of the COVID-19 declaration under Section 564(b)(1) of the Act, 21 U.S.C. section 360bbb-3(b)(1), unless the authorization is terminated or revoked.  Performed at [facility], [place] [address], [place], [place] [address]   Body fluid culture (includes gram stain)     Status: None   Collection Time: 05/01/20  8:38 PM   Specimen: Pleural Fluid  Result Value Ref Range Status   Specimen Description FLUID PLEURAL  Final   Special Requests NONE  Final   Gram Stain   Final    RARE WBC PRESENT, PREDOMINANTLY MONONUCLEAR NO ORGANISMS SEEN    Culture   Final    NO GROWTH Performed at [facility], [place] [address] [place], [place] [address]    Report Status 05/05/2020 FINAL  Final  Culture, blood (routine x 2)     Status: None   Collection Time: 05/01/20 11:05 PM   Specimen: BLOOD  Result Value Ref Range Status   Specimen Description BLOOD SITE NOT SPECIFIED  Final   Special Requests   Final    BOTTLES DRAWN AEROBIC AND ANAEROBIC Blood Culture adequate volume   Culture   Final    NO GROWTH 5 DAYS Performed at [facility], [place] [address] [place], [place] [address]    Report Status 05/07/2020 FINAL  Final  Culture, blood (routine x 2)     Status: None   Collection Time: 05/02/20  2:52 PM   Specimen: BLOOD  Result Value Ref Range Status   Specimen Description BLOOD RIGHT ANTECUBITAL  Final   Special Requests   Final    BOTTLES DRAWN AEROBIC AND ANAEROBIC Blood Culture adequate  volume   Culture   Final    NO GROWTH 5 DAYS Performed at [facility], [place] [address], [place], [place] [address]    Report Status 05/07/2020 FINAL  Final  Body fluid culture     Status: None   Collection Time: 05/04/20  2:09 PM   Specimen: Lung, Left; Pleural Fluid  Result Value Ref Range Status   Specimen Description PLEURAL FLUID  Final   Special Requests LEFT LUNG  Final   Gram Stain   Final    RARE WBC PRESENT, PREDOMINANTLY MONONUCLEAR NO ORGANISMS SEEN    Culture   Final    NO GROWTH Performed at [facility], [place] [address] [place], [place] [address]    Report Status 05/07/2020 FINAL  Final  Acid Fast Smear (AFB)     Status: None   Collection Time: 05/04/20  2:09 PM   Specimen: Lung, Left; Pleural Fluid  Result Value Ref Range Status   AFB Specimen Processing Concentration  Final   Acid Fast Smear Negative  Final    Comment: (NOTE) Performed At: [facility] [place], [place] [id number] Rush Farmer MD XY:[id number]    Source (AFB) PLEURAL  Final    Comment: FLUID LEFT LUNG Performed at [facility], [place] [address], [place], [place] [address]      RN Pressure Injury Documentation:     Estimated body mass index is 24.48 kg/m as calculated from the following:   Height as of this encounter: 5\' 4"  (1.626 m).   Weight as of this encounter: 64.7 kg.  Malnutrition Type:    Malnutrition Characteristics:   Nutrition Interventions:    Radiology Studies: DG CHEST PORT 1 VIEW  Result Date: 05/10/2020 CLINICAL DATA:  Short of breath EXAM: PORTABLE CHEST 1 VIEW COMPARISON:  05/09/2020 FINDINGS: Extensive consolidation left lower lobe with mild progression, with associated pleural effusion. No pneumothorax Mild right lower lobe airspace disease and small right effusion unchanged. Negative for heart failure or edema. IMPRESSION: Extensive consolidation left lower lobe with mild progression. Left pleural effusion not well  visualized due to consolidation. Mild right lower lobe airspace disease unchanged. Electronically Signed   By: Franchot Gallo M.D.   On: 05/10/2020 08:07   DG CHEST PORT 1 VIEW  Result Date: 05/09/2020 CLINICAL DATA:  Shortness of breath. EXAM: PORTABLE CHEST 1 VIEW COMPARISON:  Chest x-ray 05/06/2020 and CT chest 05/05/2020. FINDINGS: Stable left hilar mass with progressive surrounding airspace consolidation and near complete left lower lobe atelectasis. Suspect enlarging left pleural effusion. Stable small right pleural effusion. The right lung remains relatively clear. IMPRESSION: Stable left hilar mass with progressive surrounding airspace consolidation and near complete left lower lobe/lingular atelectasis. Suspect enlarging left pleural effusion. Electronically Signed   By: Marijo Sanes M.D.   On: 05/09/2020 09:25   Scheduled Meds: . atorvastatin  20 mg Oral QHS  . furosemide  40 mg Intravenous Once  . lidocaine      . metoprolol succinate  50 mg Oral Daily  . pantoprazole  40 mg Oral BID AC   Continuous Infusions: .  ceFAZolin (ANCEF) IV 2 g (05/10/20 1132)    LOS: 8 days   Kerney Elbe, DO [facility] PAGER is on [place]  If 7PM-7AM, please contact night-coverage [email or web address]

## 2020-05-10 NOTE — Evaluation (Signed)
Occupational Therapy Evaluation Patient Details Name: Shelley Flores MRN: [id number] DOB: 1933-02-19 Today's Date: 05/10/2020    History of Present Illness 86 yo female with onset of persistent cough was initially treated for reflux and cough was not relieved.  Now SOB and having O2 needs, will be followed for hilar mass.  Has leukocytosis, abd distension, hyponatremia, low K+.  PMHx:  ALF resident, HTN, HLD, mild valvular heart disease, smoker, abdominal bruit, LE edema, heart murmur, OA, osteoporosis, LBP, GERD,   Clinical Impression   Pt PTA: Pt living at [place].and was independent with ADL and IADL. Most meals were provided for pt. Pt currently requiring RW for mobility; reports no assist needed by staff for mobility or ADL. Pt education for energy conservation techniques. Pt aware and able to recall techniques to be used at home- pt already using most strategies at home.  Pt does not require continued OT skilled services. OT signing off as pt at her functional baseline for ADL and able to articulate and utilize energy conservation techniques with modified independence. OT D/C.     Follow Up Recommendations  No OT follow up;Supervision - Intermittent    Equipment Recommendations  None recommended by OT    Recommendations for Other Services       Precautions / Restrictions Precautions Precautions: Fall Precaution Comments: monitor O2 sats Restrictions Weight Bearing Restrictions: No      Mobility Bed Mobility Overal bed mobility: Modified Independent                  Transfers                 General transfer comment: Pt reports modified independence and staff confirmed that pt was getting up to go to bathroom without assist    Balance Overall balance assessment: Needs assistance Sitting-balance support: Feet supported Sitting balance-Leahy Scale: Fair                                     ADL either performed or assessed with  clinical judgement   ADL Overall ADL's : At baseline                                       General ADL Comments: Pt performing own ambulation to and from bathroom and performing own ADL tasks in standing at sink, and own hygiene at commode. Pt reports that she is aware of energy conservation techniques for ADL and mobility. pt able to state 4 ways that she plans to conserve energy at home. Pt education for all energy conservation techniques provided. Pt already had a great understanding of it so pt did not require printout of     Vision Baseline Vision/History: Wears glasses Wears Glasses: At all times Patient Visual Report: No change from baseline Vision Assessment?: No apparent visual deficits     Perception     Praxis      Pertinent Vitals/Pain Pain Assessment: No/denies pain     Hand Dominance Right   Extremity/Trunk Assessment Upper Extremity Assessment Upper Extremity Assessment: Overall WFL for tasks assessed   Lower Extremity Assessment Lower Extremity Assessment: Overall WFL for tasks assessed   Cervical / Trunk Assessment Cervical / Trunk Assessment: Kyphotic   Communication Communication Communication: No difficulties   Cognition Arousal/Alertness: Awake/alert Behavior During Therapy: [facility]  for tasks assessed/performed Overall Cognitive Status: Within Functional Limits for tasks assessed                                     General Comments  2L O2 >90% in room with bed mobility and extensive talking    Exercises     Shoulder Instructions      Home Living Family/patient expects to be discharged to:: Other (Comment)                                 Additional Comments: ILF at [facility]      Prior Functioning/Environment Level of Independence: Independent        Comments: walking on hallways at ILF        OT Problem List: Decreased activity tolerance      OT Treatment/Interventions:      OT  Goals(Current goals can be found in the care plan section) Acute Rehab OT Goals Patient Stated Goal: to go home OT Goal Formulation: All assessment and education complete, DC therapy  OT Frequency:     Barriers to D/C:            Co-evaluation              AM-PAC OT "6 Clicks" Daily Activity     Outcome Measure Help from another person eating meals?: None Help from another person taking care of personal grooming?: None Help from another person toileting, which includes using toliet, bedpan, or urinal?: None Help from another person bathing (including washing, rinsing, drying)?: None Help from another person to put on and taking off regular upper body clothing?: None Help from another person to put on and taking off regular lower body clothing?: None 6 Click Score: 24   End of Session Equipment Utilized During Treatment: Oxygen Nurse Communication: Mobility status  Activity Tolerance: Patient tolerated treatment well Patient left: in bed;with call bell/phone within reach  OT Visit Diagnosis: Unsteadiness on feet (R26.81);Muscle weakness (generalized) (M62.81)                Time: 1230-1250 OT Time Calculation (min): 20 min Charges:  OT General Charges $OT Visit: 1 Visit OT Evaluation $OT Eval Low Complexity: 1 Low  Jefferey Pica, OTR/L Acute Rehabilitation Services Pager: [phone] Office: [phone]   Freeland Pracht C 05/10/2020, 2:09 PM

## 2020-05-10 NOTE — Consult Note (Signed)
Chief Complaint: Patient was seen in consultation today for recurrent left pleural effusion/tunneled left chest pleurX catheter placement.  Referring Physician(s): Kerney Elbe [facility])  Supervising Physician: Aletta Edouard  Patient Status: [facility] - In-pt  History of Present Illness: Shelley Flores is a 86 y.o. female with a past medical history of hypertension, hyperlipidemia, GERD, OA, and osteoporosis. She presented to [facility] ED 05/01/2020 with complaint of dyspnea. She was found to have a large left pleural effusion and was admitted for further management. She has undergone two thoracentesis during this admission- first left thoracentesis in ED 05/01/2020 yielding 1.5 L; second left thoracentesis in IR 05/04/2020 yielding 1.55 L). 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 She continues to have dyspnea secondary to recurrent left pleural effusion. She has tentative plans for discharge to SNF.  IR consulted by Dr. Alfredia Ferguson for possible image-guided tunneled left chest pleurX catheter placement. Patient awake and alert sitting in bed. Complains of dyspnea, stable since admission. Denies fever, chills, chest pain, abdominal pain, or headache.   Past Medical History:  Diagnosis Date  . Bruit    Abdominal bruit - Abdominal aorta/renal duplex Doppler evaluation 12/07/03 -Mildly abnormal evaluation. 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 This is consistant w/median arcuate ligament compression syndrome. *Right & Left Kidney: Essentially equal in size, symmetrical in shape w/no significant abnormalities visualized. *Right & Left Renal Arteries: No significant abnormalities.  . Edema extremities    LE edema   . H/O myocardial perfusion scan 02/20/00   To rule out ischemia - Negative adequate Bruce protocol exercise stress test with a deconditioned exercise response and normal static myocardial perfusion images with EF calculated by  QGS of 77%. Represents a low risk study.  . Hyperlipidemia   . Hypertension   . Osteoarthritis   . Osteoporosis   . Pleural effusion 04/2020  . Valvular heart disease    Mild valvular heart disease by Echo in 2010, all mild and symptomatic, including concentric LVH, MR, TR, and AI with pulmonary artery pressure of 32. EF was normal.    Past Surgical History:  Procedure Laterality Date  . Abdominal aorta/Renal duplex Doppler Evaluation  12/07/03   For abdominal bruit. Mildly abnormal evaluation. (See bruit in medical history)  . BREAST EXCISIONAL BIOPSY Left 1966  . BREAST EXCISIONAL BIOPSY Left 1999  . CATARACT EXTRACTION  2003  . COLONOSCOPY  10/22/2004  . DEXA Bone Scan  06/23/2013  . IR THORACENTESIS ASP PLEURAL SPACE W/IMG GUIDE  05/04/2020    Allergies: Irbesartan  Medications: Prior to Admission medications   Medication Sig Start Date End Date Taking? Authorizing Provider  amLODipine (NORVASC) 5 MG tablet Take 5 mg by mouth daily.   Yes [provider]  atorvastatin (LIPITOR) 20 MG tablet TAKE 1 TABLET BY MOUTH EVERY DAY Patient taking differently: Take 20 mg by mouth at bedtime. 01/20/20  Yes Minus Breeding, MD  BENZONATATE PO Take 1 capsule by mouth 3 (three) times daily.   Yes [provider]  hydrochlorothiazide (MICROZIDE) 12.5 MG capsule TAKE 1 CAPSULE BY MOUTH EVERY DAY Patient taking differently: Take 12.5 mg by mouth daily. 03/09/20  Yes Hilty, Nadean Corwin, MD  metoprolol succinate (TOPROL-XL) 50 MG 24 hr tablet TAKE 1 TABLET (50 MG TOTAL) BY MOUTH DAILY. TAKE WITH OR IMMEDIATELY FOLLOWING A MEAL. 03/09/20  Yes Hilty, Nadean Corwin, MD  alendronate (FOSAMAX) 70 MG tablet Take 70 mg by mouth every Saturday. 01/31/17   [provider]  aspirin  81 MG tablet Take 81 mg by mouth daily.    [provider]  Calcium-Vitamin D-Vitamin K (VIACTIV CALCIUM PLUS D) 650-12.5-40 MG-MCG-MCG CHEW Chew 1 tablet by mouth daily.    [provider]   Glucosamine HCl 1000 MG TABS Take 1,000 mg by mouth daily.     [provider]  Multiple Vitamin (MULTIVITAMIN) tablet Take 1 tablet by mouth daily.    [provider]  PRILOSEC OTC 20 MG tablet Take 20 mg by mouth 2 (two) times daily before a meal.    [provider]     Family History  Problem Relation Age of Onset  . Leukemia Mother 43  . Cancer Father        esophagial cancer  . Heart failure Maternal Grandmother   . Tuberculosis Maternal Grandfather   . Heart disease Paternal Grandmother   . Cancer Paternal Grandfather     Social History   Socioeconomic History  . Marital status: Single    Spouse name: Not on file  . Number of children: Not on file  . Years of education: Not on file  . Highest education level: Not on file  Occupational History  . Occupation: Retired    Fish farm manager: [employer]    Comment: Chemistry professor  Tobacco Use  . Smoking status: Former Smoker    Packs/day: 0.25    Years: 6.00    Pack years: 1.50    Types: Cigarettes    Quit date: 05/13/1962    Years since quitting: 58.0  . Smokeless tobacco: Never Used  Vaping Use  . Vaping Use: Never used  Substance and Sexual Activity  . Alcohol use: Yes    Alcohol/week: 2.0 standard drinks    Types: 2 Standard drinks or equivalent per week    Comment: eine  . Drug use: No  . Sexual activity: Not on file  Other Topics Concern  . Not on file  Social History Narrative  . Not on file   Social Determinants of Health   Financial Resource Strain: Not on file  Food Insecurity: Not on file  Transportation Needs: Not on file  Physical Activity: Not on file  Stress: Not on file  Social Connections: Not on file     Review of Systems: A 12 point ROS discussed and pertinent positives are indicated in the HPI above.  All other systems are negative.  Review of Systems  Constitutional: Negative for chills and fever.  Respiratory: Positive for shortness of breath. Negative  for wheezing.   Cardiovascular: Negative for chest pain and palpitations.  Gastrointestinal: Negative for abdominal pain.  Neurological: Negative for headaches.  Psychiatric/Behavioral: Negative for behavioral problems and confusion.    Vital Signs: BP 137/62 (BP Location: Right Arm)   Pulse 92   Temp 97.9 F (36.6 C) (Oral)   Resp 18   Ht 5\' 4"  (1.626 m)   Wt 142 lb 10.2 oz (64.7 kg)   SpO2 94%   BMI 24.48 kg/m   Physical Exam Vitals and nursing note reviewed.  Constitutional:      General: She is not in acute distress.    Appearance: Normal appearance.  Cardiovascular:     Rate and Rhythm: Normal rate and regular rhythm.     Heart sounds: Normal heart sounds. No murmur heard.   Pulmonary:     Effort: Pulmonary effort is normal. No respiratory distress.     Breath sounds: No wheezing.     Comments: Decreased breath sounds on  left. Skin:    General: Skin is warm and dry.  Neurological:     Mental Status: She is alert and oriented to person, place, and time.      MD Evaluation Airway: WNL Heart: WNL Abdomen: WNL Chest/ Lungs: WNL ASA  Classification: 3 Mallampati/Airway Score: Two   Imaging: DG Chest 1 View  Result Date: 05/06/2020 CLINICAL DATA:  Shortness of breath. EXAM: CHEST  1 VIEW COMPARISON:  05/05/2020 FINDINGS: 0926 hours. Interval progression of left parahilar and retrocardiac basilar collapse/consolidative opacity. Left pleural effusion noted. There is probable some minimal atelectasis at the right base with tiny right pleural effusion. IMPRESSION: 1. Interval progression of left parahilar and retrocardiac basilar collapse/consolidative opacity. 2. Left pleural effusion. 3. Similar appearance of minimal atelectasis/infiltrate at the right base with tiny right effusion. Electronically Signed   By: Misty Stanley M.D.   On: 05/06/2020 09:33   DG Chest 1 View  Result Date: 05/04/2020 CLINICAL DATA:  Post thoracentesis on the left. EXAM: CHEST  1 VIEW  COMPARISON:  Radiographs earlier today and 05/03/2020. CT 05/02/2020. FINDINGS: 1416 hours. Interval decreased size of left pleural effusion following thoracentesis. There is improved aeration of the left lung with persistent left perihilar opacity which may reflect a mass or infiltrate. No pneumothorax. The right lung remains clear. There is a stable small right pleural effusion. The heart size and mediastinal contours are stable. IMPRESSION: 1. Interval decreased size of left pleural effusion following thoracentesis with improved aeration of the left lung. No pneumothorax. 2. Persistent left perihilar opacity may reflect a mass or infiltrate. Electronically Signed   By: Richardean Sale M.D.   On: 05/04/2020 14:49   DG Chest 2 View  Result Date: 05/03/2020 CLINICAL DATA:  Shortness of breath, pleural effusion EXAM: CHEST - 2 VIEW COMPARISON:  CT chest, 05/02/2020, chest radiographs, 05/01/2020 FINDINGS: Large left, small right pleural effusions with associated atelectasis or consolidation are not significantly changed compared to prior examination. No new airspace opacity. The cardiac borders are largely obscured. Osseous structures are unremarkable. IMPRESSION: Large left, small right pleural effusions with associated atelectasis or consolidation are not significantly changed compared to prior examination. No new airspace opacity. Electronically Signed   By: Eddie Candle M.D.   On: 05/03/2020 08:22   DG Chest 2 View  Result Date: 05/01/2020 CLINICAL DATA:  Shortness of breath for 5 days, diagnosed with a LEFT pleural effusion on Friday, history hypertension, former smoker EXAM: CHEST - 2 VIEW COMPARISON:  04/28/2020 FINDINGS: Subtotal opacification of the LEFT hemithorax by large LEFT pleural effusion. Significant atelectasis of LEFT lung and mediastinal shift LEFT to RIGHT. Small RIGHT pleural effusion identified. Heart size poorly seen. No pneumothorax or RIGHT lung consolidation identified. Bones  demineralized. IMPRESSION: Large LEFT pleural effusion with subtotal atelectasis of LEFT lung and mediastinal shift LEFT to RIGHT little changed since 04/28/2020. Electronically Signed   By: Lavonia Dana M.D.   On: 05/01/2020 16:54   DG Chest 2 View  Result Date: 04/28/2020 CLINICAL DATA:  Chronic cough and shortness of breath EXAM: CHEST - 2 VIEW COMPARISON:  April 21, 2020 FINDINGS: There is a large left pleural effusion. There may well be a degree of underlying atelectasis and/or consolidation on the left. There is a minimal right pleural effusion. Right lung otherwise clear. Heart size is normal. Pulmonary vascular on the right is normal. There is distortion of pulmonary vascular on the left due to the large effusion. No adenopathy appreciable. No bone lesions. IMPRESSION: Large  left pleural effusion occupying much of the left hemithorax, similar to recent prior study. Question underlying atelectasis and/or consolidation on the left. Right lung clear except for minimal right pleural effusion. Heart size normal. These results will be called to the ordering clinician or representative by the Radiologist Assistant, and communication documented in the PACS or [employer]. Electronically Signed   By: Lowella Grip III M.D.   On: 04/28/2020 12:51   CT CHEST W CONTRAST  Result Date: 05/04/2020 CLINICAL DATA:  Pleural effusion, left thoracentesis, abnormal chest x-ray EXAM: CT CHEST WITH CONTRAST TECHNIQUE: Multidetector CT imaging of the chest was performed during intravenous contrast administration. CONTRAST:  55mL OMNIPAQUE IOHEXOL 300 MG/ML  SOLN COMPARISON:  05/02/2020 FINDINGS: Cardiovascular: The heart is unremarkable without pericardial effusion. There is normal caliber of the thoracic aorta with no aneurysm or dissection. Minimal atherosclerosis. No evidence of pulmonary embolus. There is extrinsic compression of the left upper lobe pulmonary artery. Mediastinum/Nodes: Subcarinal adenopathy  measuring 14 mm in short axis. Thyroid, trachea, and esophagus are unremarkable. Lungs/Pleura: There is masslike consolidation of the left upper lobe abutting the mediastinum, measuring 4.5 x 4.1 x 4.6 cm (see image 61/3 and image 59/5). Medial extent of this mass invades the mediastinum at the level of the aortic or pulmonary window, reference image 57/3 and 59/5. Findings are consistent with underlying malignancy and postobstructive change. PET CT may better delineate the mass from the postobstructive change. Bronchoscopy may be useful for tissue diagnosis if recent thoracentesis was nondiagnostic for malignancy. There are small bilateral pleural effusions. On the left, there is a small loculated component in the anterior left hemithorax adjacent to the left upper lobe mass described above. Scattered hypoventilatory changes are seen at the lung bases and within the lingula. Interstitial prominence within the left upper lobe could reflect lymphangitic spread of disease. Upper Abdomen: Complex left renal cysts are unchanged. No acute upper abdominal process. Musculoskeletal: No acute or destructive bony lesions. Reconstructed images demonstrate no additional findings. IMPRESSION: 1. Left upper lobe masslike consolidation consistent with malignancy. A portion of the mass invades the mediastinum at the level of the aortic or pulmonary window. PET-CT may be useful to delineate the mass from the postobstructive change. 2. Prominence of the left upper lobe pulmonary interstitium, concerning for lymphangitic spread of disease. 3. A subcarinal lymphadenopathy worrisome for nodal metastasis. Again, PET-CT may be useful. 4. Small bilateral pleural effusions, partially loculated on the left. Decreased left effusion after recent thoracentesis. 5.  Aortic Atherosclerosis (ICD10-I70.0). Electronically Signed   By: Randa Ngo M.D.   On: 05/04/2020 23:09   CT ABDOMEN PELVIS W CONTRAST  Result Date: 05/05/2020 CLINICAL  DATA:  Urologic cancer surveillance EXAM: CT ABDOMEN AND PELVIS WITH CONTRAST TECHNIQUE: Multidetector CT imaging of the abdomen and pelvis was performed using the standard protocol following bolus administration of intravenous contrast. CONTRAST:  22mL OMNIPAQUE IOHEXOL 300 MG/ML  SOLN COMPARISON:  CT chest, 05/04/2020, CT chest abdomen pelvis, 05/02/2020 FINDINGS: Lower chest: Small, left greater than right bilateral pleural effusions, similar to prior examination. Nodular thickening of the dependent left-sided parietal pleura (series 3, image 11). Hepatobiliary: Multiple subcentimeter fluid and low-attenuation lesions of the liver, not significant changed compared to prior examination, and most likely benign incidental cysts and/or hemangiomata. No gallstones, gallbladder wall thickening, or biliary dilatation. Pancreas: There is an unchanged 1.1 cm fluid low-attenuation lesion of the pancreatic tail (series 3, image 26). No pancreatic ductal dilatation or surrounding inflammatory changes. Spleen: Normal in size without  significant abnormality. Adrenals/Urinary Tract: Adrenal glands are unremarkable. There are multiple bilateral simple renal cysts, the largest in the midportion of the left kidney and inferior pole of the right kidney. Kidneys are otherwise normal, without renal calculi, solid lesion, or hydronephrosis. Bladder is unremarkable. Stomach/Bowel: Stomach is within normal limits. Appendix is not clearly visualized and may be diminutive or surgically absent. There is redemonstrated mild rectal wall thickening and minimal perirectal fat stranding (series 3, image 69). Vascular/Lymphatic: Aortic atherosclerosis. No enlarged abdominal or pelvic lymph nodes. Reproductive: Status post hysterectomy. Other: No abdominal wall hernia or abnormality. No abdominopelvic ascites. Musculoskeletal: No acute or significant osseous findings. IMPRESSION: 1. No definite evidence of malignancy or metastatic disease in the  abdomen or pelvis. Examination is generally unchanged compared to recent CT dated 05/02/2020. 2. No evidence of urologic malignancy per ordering indication. Definitively benign bilateral renal cysts for which no further characterization or follow-up is specifically required. 3. Multiple subcentimeter low-attenuation lesions of the liver, generally too small to confidently characterize, although the largest are clearly simple cysts, remaining smaller lesions almost certainly incidental small cysts or hemangiomata. No findings suspicious for hepatic metastatic disease. 4. There is redemonstrated mild rectal wall thickening and minimal perirectal fat stranding, again suggesting nonspecific proctitis. Correlate with referable signs and symptoms if present. Synchronous rectal malignancy would be difficult to strictly exclude. Consider digital and/or endoscopic examination. 5. There is an unchanged 1.1 cm fluid low-attenuation lesion of the pancreatic tail. 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 There is no pancreatic ductal dilatation. Given new diagnosis of advanced stage lung malignancy, recommend attention on follow-up with future clinically indicated oncology imaging. 6. Small, left greater than right bilateral pleural effusions, similar to prior examination. Nodular thickening of the dependent left-sided parietal pleura. 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 Aortic Atherosclerosis (ICD10-I70.0). Electronically Signed   By: Eddie Candle M.D.   On: 05/05/2020 19:22   CT CHEST ABDOMEN PELVIS W CONTRAST  Addendum Date: 05/02/2020   ADDENDUM REPORT: 05/02/2020 04:13 ADDENDUM: These results were called by telephone at the time of interpretation on 05/02/2020 at 4:13 am to provider [facility] , who verbally acknowledged these results. Electronically Signed   By: Lovena Le M.D.   On: 05/02/2020 04:13   Result Date: 05/02/2020 CLINICAL DATA:  Pleural  effusion, malignancy suspected EXAM: CT CHEST, ABDOMEN, AND PELVIS WITH CONTRAST TECHNIQUE: Multidetector CT imaging of the chest, abdomen and pelvis was performed following the standard protocol during bolus administration of intravenous contrast. CONTRAST:  194mL OMNIPAQUE IOHEXOL 300 MG/ML  SOLN COMPARISON:  Radiograph 05/02/2019 FINDINGS: CT CHEST FINDINGS Cardiovascular: The aortic root is suboptimally assessed given cardiac pulsation artifact. Atherosclerotic plaque within the normal caliber aorta. No acute luminal abnormality of the imaged aorta. No periaortic stranding or hemorrhage. Normal 3 vessel branching of the aortic arch. Proximal great vessels are mildly calcified but otherwise unremarkable. Cardiac size is within normal limits. Trace pericardial effusion. Coronary artery calcifications. Central pulmonary arteries are normal caliber. No large central filling defects are present with more distal evaluation limited on this non tailored examination of the pulmonary arteries. Mediastinum/Nodes: Scattered conspicuous mediastinal nodes including several enlarged nodes towards the cardiac apex measuring up to 10 mm short axis (3/46) additional posterior mediastinal nodes are present including a left periaortic node measuring 8 mm (3/43 and several subpleural nodules, likely small lymph nodes (3/36) subcarinal lymph node measures up to 16 mm (3/26). Lungs/Pleura: Moderate left and small right pleural effusions.  The left pleural effusion demonstrates areas of conspicuous pleural thickening (3/34) and small pleural nodules (3/23). While there are adjacent areas of passive atelectatic change heterogeneous enhancement of the atelectatic lung parenchyma particularly within collapsed portions of lingula, anterior segment left upper lobe and anterior basal segment of the left lower raise concern for underlying airspace disease or malignancy. Aerated portions of the left upper and lower lobe demonstrate additional  mixed ground-glass and consolidative opacity worrisome for are further airspace disease. Relative sparing in the aerated portions of the right lung. Musculoskeletal: There is asymmetric soft tissue edema across the left chest wall. No clear extension of the pleural or airspace processes in to the chest wall proper. No acute or worrisome osseous lesions. Heterogeneously dense breast tissue without focal breast nodularity or masses. Few benign macro calcifications. Multilevel degenerative changes in the spine and shoulders. Dextrocurvature of the thoracic spine. CT ABDOMEN PELVIS FINDINGS Hepatobiliary: Few scattered subcentimeter hypoattenuating foci are present in the liver, largest in the left lobe measuring up to 8 mm (3/51). Too small to fully characterize on CT imaging. No other focal concerning liver lesions. Gallbladder is unremarkable. No pericholecystic fluid or inflammation. No visible intraductal gallstones or biliary dilatation. Pancreas: No pancreatic ductal dilatation or surrounding inflammatory changes. Spleen: Few punctate hypoattenuating foci in the spleen are too small to characterize. No worrisome focal splenic lesions. Normal splenic size. Adrenals/Urinary Tract: No concerning adrenal nodules or masses. Multiple fluid attenuation cysts are present in both kidneys, largest are present in the upper pole left kidney measuring up to 7.4 cm with some thin peripheral mural calcification (Bosniak 2). No concerning focal renal lesions. No urolithiasis or hydronephrosis. Urinary bladder is largely decompressed at the time of exam and therefore poorly evaluated by CT imaging. No gross bladder abnormality. Stomach/Bowel: Mild thickening at the gastric antrum is likely related to normal peristaltic motion. Duodenum is unremarkable. High attenuation enteric contrast material partially traverses the colon. No small bowel thickening or dilatation. No proximal colonic abnormalities are identified. Some  questionable thickening of the rectosigmoid though may be related to underdistention. However some perirectal fat stranding is present which is nonspecific. No evidence of obstruction. Appendix is not visualized. No focal inflammation the vicinity of the cecum to suggest an occult appendicitis. Vascular/Lymphatic: Atherosclerotic calcifications within the abdominal aorta and branch vessels. No aneurysm or ectasia. No enlarged abdominopelvic lymph nodes. Reproductive: Patient appears to be post hysterectomy. No concerning adnexal lesion. Other: Mild presacral fat stranding and trace fluid in the pelvis, possibly reactive fluid status or reactive change. No free air. Mild body wall edema most pronounced over the left flank and hip. No bowel containing hernias. Musculoskeletal: Multilevel degenerative changes are present in the imaged portions of the spine. Stepwise retrolisthesis L1-L4. Levocurvature of the lumbar spine. No spondylolysis. Additional degenerative changes in the hips and pelvis. No acute osseous abnormality or suspicious osseous lesion. Sclerotic focus in the right ilium (6/53) is likely bone island. IMPRESSION: 1. Moderate left and small right pleural effusions. The left pleural effusion demonstrates areas of conspicuous pleural thickening and small pleural nodularity. While this could reflect empyema, a malignant effusion with pleural deposits would present similarly. Furthermore, there is heterogeneous enhancement of the adjacent atelectatic lung parenchyma including within the lingula, anterior segment left upper lobe and anterior basal segment of the left lower lobe. Could reflect underlying airspace disease or malignancy with additional airspace disease seen throughout aerated portions of the left lung. 2. Asymmetric soft tissue edema across the left chest wall.  No clear extension of the pleural or airspace processes in to the chest wall proper. Possibly reactive though should with exam findings  and continued attention on follow-up imaging is recommended. 3. Some questionable thickening of the rectosigmoid though may be related to underdistention though some mild perirectal fat stranding is present however and could suggest an underlying proctitis. 4. Aortic Atherosclerosis (ICD10-I70.0). Currently attempting to contact the ordering provider with a critical value result. Addendum will be submitted upon case discussion. Electronically Signed: By: Lovena Le M.D. On: 05/02/2020 04:04   DG CHEST PORT 1 VIEW  Result Date: 05/10/2020 CLINICAL DATA:  Short of breath EXAM: PORTABLE CHEST 1 VIEW COMPARISON:  05/09/2020 FINDINGS: Extensive consolidation left lower lobe with mild progression, with associated pleural effusion. No pneumothorax Mild right lower lobe airspace disease and small right effusion unchanged. Negative for heart failure or edema. IMPRESSION: Extensive consolidation left lower lobe with mild progression. Left pleural effusion not well visualized due to consolidation. Mild right lower lobe airspace disease unchanged. Electronically Signed   By: Franchot Gallo M.D.   On: 05/10/2020 08:07   DG CHEST PORT 1 VIEW  Result Date: 05/09/2020 CLINICAL DATA:  Shortness of breath. EXAM: PORTABLE CHEST 1 VIEW COMPARISON:  Chest x-ray 05/06/2020 and CT chest 05/05/2020. FINDINGS: Stable left hilar mass with progressive surrounding airspace consolidation and near complete left lower lobe atelectasis. Suspect enlarging left pleural effusion. Stable small right pleural effusion. The right lung remains relatively clear. IMPRESSION: Stable left hilar mass with progressive surrounding airspace consolidation and near complete left lower lobe/lingular atelectasis. Suspect enlarging left pleural effusion. Electronically Signed   By: Marijo Sanes M.D.   On: 05/09/2020 09:25   DG CHEST PORT 1 VIEW  Result Date: 05/05/2020 CLINICAL DATA:  SOB. Hx of valvular heart disease and HTN EXAM: PORTABLE CHEST 1  VIEW COMPARISON:  05/04/2020 FINDINGS: Normal cardiac silhouette. Dense streaky LEFT lobe opacity increased from prior. Obscuration of the heart border. Small LEFT effusion. RIGHT lung is clear. Lungs are hyperinflated. No pneumothorax IMPRESSION: LEFT lower lobe and lingular pneumonia and probable effusion. Electronically Signed   By: Suzy Bouchard M.D.   On: 05/05/2020 09:04   DG CHEST PORT 1 VIEW  Result Date: 05/04/2020 CLINICAL DATA:  Shortness of breath.  Pleural effusion. EXAM: PORTABLE CHEST 1 VIEW COMPARISON:  May 03, 2020. FINDINGS: Increased size of a large left pleural effusion. Overlying left opacities. Similar versus slightly increased small right pleural effusion. No visible pneumothorax. Cardiac silhouette is largely obscured. No acute osseous abnormality. IMPRESSION: 1. Increased large left pleural effusion. Overlying left opacities may represent pneumonia and/or atelectasis. 2. Similar versus slightly increased small right pleural effusion. Electronically Signed   By: Margaretha Sheffield MD   On: 05/04/2020 08:10   DG Chest Portable 1 View  Result Date: 05/01/2020 CLINICAL DATA:  Shortness of breath, status post thoracentesis. EXAM: PORTABLE CHEST 1 VIEW COMPARISON:  Chest x-ray 05/01/2020 FINDINGS: The heart size and mediastinal contours are not well visualized due to overlying pleural effusion and pulmonary changes. These airspace opacity of the left lung. No pulmonary edema. Interval decrease in a now small to moderate volume left pleural effusion. Persistent trace right pleural effusion. No pneumothorax. No acute osseous abnormality. IMPRESSION: 1. Interval decrease in size of a now small to moderate volume left pleural effusion. Underlying infection, inflammation, pulmonary mass not excluded. 2. Trace right pleural effusion. Electronically Signed   By: Iven Finn M.D.   On: 05/01/2020 20:50   ECHOCARDIOGRAM COMPLETE  Result Date: 05/04/2020    ECHOCARDIOGRAM REPORT    Patient Name:   Shelley Flores Date of Exam: 05/04/2020 Medical Rec #:  [id number]          Height:       64.0 in Accession #:    [id number]         Weight:       142.6 lb Date of Birth:  January 11, 1933           BSA:          1.695 m Patient Age:    37 years           BP:           150/66 mmHg Patient Gender: F                  HR:           88 bpm. Exam Location:  Inpatient Procedure: 2D Echo, Cardiac Doppler and Color Doppler Indications:    Dyspnea  History:        Patient has prior history of Echocardiogram examinations, most                 recent 03/27/2018. Signs/Symptoms:Shortness of Breath; Risk                 Factors:Hypertension, Dyslipidemia and Former Smoker. Large left                 pleural effusion.  Sonographer:    Dustin Flock Referring Phys: [phone] MURALI RAMASWAMY  Sonographer Comments: Technically challenging study due to limited acoustic windows. Difficult windows due to large pleural effusion. IMPRESSIONS  1. Left ventricular ejection fraction, by estimation, is 60 to 65%. The left ventricle has normal function. The left ventricle has no regional wall motion abnormalities. Left ventricular diastolic parameters are consistent with Grade I diastolic dysfunction (impaired relaxation). Elevated left ventricular end-diastolic pressure.  2. Right ventricular systolic function is normal. The right ventricular size is normal.  3. The mitral valve is degenerative. No evidence of mitral valve regurgitation. No evidence of mitral stenosis. Moderate mitral annular calcification.  4. The aortic valve is normal in structure. Aortic valve regurgitation is not visualized. No aortic stenosis is present.  5. The inferior vena cava is normal in size with greater than 50% respiratory variability, suggesting right atrial pressure of 3 mmHg. FINDINGS  Left Ventricle: Left ventricular ejection fraction, by estimation, is 60 to 65%. The left ventricle has normal function. The left ventricle has no regional wall  motion abnormalities. The left ventricular internal cavity size was normal in size. There is  no left ventricular hypertrophy. Left ventricular diastolic parameters are consistent with Grade I diastolic dysfunction (impaired relaxation). Elevated left ventricular end-diastolic pressure. Right Ventricle: The right ventricular size is normal. No increase in right ventricular wall thickness. Right ventricular systolic function is normal. Left Atrium: Left atrial size was normal in size. Right Atrium: Right atrial size was normal in size. Pericardium: There is no evidence of pericardial effusion. Mitral Valve: The mitral valve is degenerative in appearance. Moderate mitral annular calcification. No evidence of mitral valve regurgitation. No evidence of mitral valve stenosis. Tricuspid Valve: The tricuspid valve is normal in structure. Tricuspid valve regurgitation is not demonstrated. No evidence of tricuspid stenosis. Aortic Valve: The aortic valve is normal in structure. Aortic valve regurgitation is not visualized. No aortic stenosis is present. Pulmonic Valve: The pulmonic valve was normal in structure. Pulmonic valve regurgitation is  not visualized. No evidence of pulmonic stenosis. Aorta: The aortic root is normal in size and structure. Venous: The inferior vena cava is normal in size with greater than 50% respiratory variability, suggesting right atrial pressure of 3 mmHg. IAS/Shunts: No atrial level shunt detected by color flow Doppler.  LEFT VENTRICLE PLAX 2D LVIDd:         3.30 cm Diastology LVIDs:         1.60 cm LV e' medial:    4.03 cm/s LV PW:         1.00 cm LV E/e' medial:  19.9 LV IVS:        1.00 cm LV e' lateral:   5.00 cm/s                        LV E/e' lateral: 16.0  RIGHT VENTRICLE RV Basal diam:  2.20 cm RV S prime:     2.50 cm/s TAPSE (M-mode): 2.0 cm LEFT ATRIUM             Index       RIGHT ATRIUM          Index LA Vol (A2C):   26.5 ml 15.64 ml/m RA Area:     8.27 cm LA Vol (A4C):   23.7 ml  13.99 ml/m RA Volume:   15.00 ml 8.85 ml/m LA Biplane Vol: 26.8 ml 15.82 ml/m  AORTIC VALVE LVOT Vmax:   120.00 cm/s LVOT Vmean:  84.000 cm/s LVOT VTI:    0.220 m  AORTA Ao Root diam: 2.60 cm MITRAL VALVE MV Area (PHT): 3.60 cm     SHUNTS MV Decel Time: 211 msec     Systemic VTI: 0.22 m MV E velocity: 80.20 cm/s MV A velocity: 127.00 cm/s MV E/A ratio:  0.63 Skeet Latch MD Electronically signed by Skeet Latch MD Signature Date/Time: 05/04/2020/11:23:20 AM    Final    VAS [place] LOWER EXTREMITY VENOUS (DVT)  Result Date: 05/04/2020  Lower Venous DVT Study Indications: Dyspnea and fatigue x 10 days.  Risk Factors: Recent pleural effusion and thoracentesis w/ suspected malignancy. Performing Technologist: Rogelia Rohrer  Examination Guidelines: A complete evaluation includes B-mode imaging, spectral Doppler, color Doppler, and power Doppler as needed of all accessible portions of each vessel. Bilateral testing is considered an integral part of a complete examination. Limited examinations for reoccurring indications may be performed as noted. The reflux portion of the exam is performed with the patient in reverse Trendelenburg.  +---------+---------------+---------+-----------+----------+--------------+ RIGHT    CompressibilityPhasicitySpontaneityPropertiesThrombus Aging +---------+---------------+---------+-----------+----------+--------------+ CFV      Full           Yes      Yes                                 +---------+---------------+---------+-----------+----------+--------------+ SFJ      Full                                                        +---------+---------------+---------+-----------+----------+--------------+ FV Prox  Full           Yes      Yes                                 +---------+---------------+---------+-----------+----------+--------------+  FV Mid   Full           Yes      Yes                                  +---------+---------------+---------+-----------+----------+--------------+ FV DistalFull           Yes      Yes                                 +---------+---------------+---------+-----------+----------+--------------+ PFV      Full                                                        +---------+---------------+---------+-----------+----------+--------------+ POP      Full           Yes      Yes                                 +---------+---------------+---------+-----------+----------+--------------+ PTV      Full                                                        +---------+---------------+---------+-----------+----------+--------------+ PERO     Full                                                        +---------+---------------+---------+-----------+----------+--------------+   +---------+---------------+---------+-----------+----------+--------------+ LEFT     CompressibilityPhasicitySpontaneityPropertiesThrombus Aging +---------+---------------+---------+-----------+----------+--------------+ CFV      Full           Yes      Yes                                 +---------+---------------+---------+-----------+----------+--------------+ SFJ      Full                                                        +---------+---------------+---------+-----------+----------+--------------+ FV Prox  Full           Yes      Yes                                 +---------+---------------+---------+-----------+----------+--------------+ FV Mid   Full           Yes      Yes                                 +---------+---------------+---------+-----------+----------+--------------+ FV DistalFull  Yes      Yes                                 +---------+---------------+---------+-----------+----------+--------------+ PFV      Full                                                         +---------+---------------+---------+-----------+----------+--------------+ POP      Full           Yes      Yes                                 +---------+---------------+---------+-----------+----------+--------------+ PTV      Full                                                        +---------+---------------+---------+-----------+----------+--------------+ PERO     Full                                                        +---------+---------------+---------+-----------+----------+--------------+     Summary: RIGHT: - There is no evidence of deep vein thrombosis in the lower extremity.  - No cystic structure found in the popliteal fossa.  LEFT: - There is no evidence of deep vein thrombosis in the lower extremity.  - No cystic structure found in the popliteal fossa.  *See table(s) above for measurements and observations. Electronically signed by Monica Martinez MD on 05/04/2020 at 7:55:52 PM.    Final    IR THORACENTESIS ASP PLEURAL SPACE W/IMG GUIDE  Result Date: 05/04/2020 INDICATION: Patient with history of acute hypoxic respiratory failure secondary to recurrent left pleural effusion. Request is made for diagnostic and therapeutic left thoracentesis. EXAM: ULTRASOUND GUIDED DIAGNOSTIC AND THERAPEUTIC THORACENTESIS MEDICATIONS: 10 mL 2% lidocaine COMPLICATIONS: None immediate. PROCEDURE: An ultrasound guided thoracentesis was thoroughly discussed with the patient and questions answered. The benefits, risks, alternatives and complications were also discussed. The patient understands and wishes to proceed with the procedure. Written consent was obtained. Ultrasound was performed to localize and mark an adequate pocket of fluid in the left chest. The area was then prepped and draped in the normal sterile fashion. 1% Lidocaine was used for local anesthesia. Under ultrasound guidance a 6 Fr Safe-T-Centesis catheter was introduced. Thoracentesis was performed. The catheter was  removed and a dressing applied. FINDINGS: A total of approximately 1.55 L of hazy amber fluid was removed. Samples were sent to the laboratory as requested by the clinical team. IMPRESSION: Successful ultrasound guided left thoracentesis yielding 1.55 L of pleural fluid. Read by: Earley Abide, PA-C Electronically Signed   By: Markus Daft M.D.   On: 05/04/2020 15:12    Labs:  CBC: Recent Labs    05/07/20 0144 05/08/20 1812 05/09/20 0319 05/10/20 0322  WBC 13.3* 12.7* 11.6* 12.2*  HGB 13.9 15.4* 14.0 14.2  HCT 39.2  44.2 43.3 43.1  PLT 276 283 248 251    COAGS: No results for input(s): INR, APTT in the last 8760 hours.  BMP: Recent Labs    04/14/20 0829 05/01/20 1632 05/05/20 0314 05/07/20 0144 05/09/20 0319 05/10/20 0322  NA 138   < > 137 135 136 137  K 4.0   < > 3.4* 3.6 4.3 4.2  CL 99   < > 105 102 106 105  CO2 25   < > 23 25 26 23   GLUCOSE 99   < > 106* 118* 108* 112*  BUN 12   < > 7* 9 8 11   CALCIUM 10.6*   < > 9.2 9.7 9.6 9.8  CREATININE 0.86   < > 0.69 0.90 0.79 0.77  GFRNONAA 62   < > >60 >60 >60 >60  GFRAA 71  --   --   --   --   --    < > = values in this interval not displayed.    LIVER FUNCTION TESTS: Recent Labs    05/05/20 0314 05/07/20 0144 05/09/20 0319 05/10/20 0322  BILITOT 0.7 0.8 0.5 0.6  AST 19 20 17 24   ALT 24 22 20 20   ALKPHOS 45 48 51 54  PROT 5.0* 5.0* 5.1* 5.2*  ALBUMIN 2.6* 2.4* 2.4* 2.5*     Assessment and Plan:  Recurrent symptomatic left pleural effusion (most recent thoracentesis 05/04/2020 yielding 1.55 L- cytology revealing adenocarcinoma) with tentative plans for discharge to SNF. Plan for image-guided tunneled left chest pleurX catheter placement in IR tentatively for today pending IR scheduling. Patient is NPO. Afebrile. COVID negative 05/01/2020.  Risks and benefits discussed with the patient including bleeding, infection, damage to adjacent structures, malfunction of the catheter with need for additional  procedures. All of the patient's questions were answered, patient is agreeable to proceed. Consent signed and in IR control room.   Thank you for this interesting consult.  I greatly enjoyed meeting SARAHBETH CASHIN and look forward to participating in their care.  A copy of this report was sent to the requesting provider on this date.  Electronically Signed: Earley Abide, PA-C 05/10/2020, 10:14 AM   I spent a total of 40 Minutes in face to face in clinical consultation, greater than 50% of which was counseling/coordinating care for recurrent left pleural effusion/tunneled left chest pleurX catheter placement.

## 2020-05-10 NOTE — Procedures (Signed)
Interventional Radiology Procedure Note  Procedure: Tunneled left PleurX catheter placement  Complications: None  Estimated Blood Loss: < 10 mL  Findings: 15.5 Fr tunneled PleurX catheter placed via right midaxillary approach that extends towards apex. 1.1 L of pleural fluid removed after tube placement.  Venetia Night. Kathlene Cote, M.D Pager:  [phone]

## 2020-05-10 NOTE — TOC Progression Note (Addendum)
Transition of Care [facility]) - Progression Note    Patient Details  Name: Shelley Flores MRN: [id number] Date of Birth: 06/23/1932  [facility]) CM/SW Lafe, RN Phone Number: [phone]  05/10/2020, 9:02 AM  Clinical Narrative:    Phoebe Perch completed PASRR completed. CM called Bobbietta SW at [facility] to confirm bed availability. There is no answer. Message left for SW to call CM back. Will await return call. Ins Josem Kaufmann has to be initiated via the SNF.   1113 Pleural drain supply forms have been faxed to [facility] per request to ensure that facility will be able to order and receive supplies prior to d/c.         Expected Discharge Plan and Services                                                 Social Determinants of Health (SDOH) Interventions    Readmission Risk Interventions No flowsheet data found.

## 2020-05-10 NOTE — Progress Notes (Signed)
Physical Therapy Treatment Patient Details Name: Shelley Flores MRN: [id number] DOB: 1933-02-11 Today's Date: 05/10/2020    History of Present Illness 86 yo female with onset of persistent cough was initially treated for reflux and cough was not relieved.  Now SOB and having O2 needs, will be followed for hilar mass.  Has leukocytosis, abd distension, hyponatremia, low K+.  PMHx:  ALF resident, HTN, HLD, mild valvular heart disease, smoker, abdominal bruit, LE edema, heart murmur, OA, osteoporosis, LBP, GERD,    PT Comments    Pt was seen for mobility on RW with monitoring of sats and good control of O2 today.  Despite maintaining sats in 90+% range, PT replaced cannula since pt was SOB and low values down to 90% with cannula off.  Following her for strengthening and increasing time in standing and walking.  Pt will continue to be seen until dc to rehab for completion of her program and return to [facility].     Follow Up Recommendations  SNF     Equipment Recommendations  Rolling walker with 5" wheels    Recommendations for Other Services       Precautions / Restrictions Precautions Precautions: Fall Precaution Comments: monitor O2 sats Restrictions Weight Bearing Restrictions: No    Mobility  Bed Mobility Overal bed mobility: Modified Independent                Transfers Overall transfer level: Needs assistance Equipment used: Rolling walker (2 wheeled) Transfers: Sit to/from Stand Sit to Stand: Min guard            Ambulation/Gait Ambulation/Gait assistance: Min guard Gait Distance (Feet): 100 Feet Assistive device: Rolling walker (2 wheeled);1 person hand held assist Gait Pattern/deviations: Step-through pattern;Decreased stride length;Wide base of support Gait velocity: reduced Gait velocity interpretation: <1.31 ft/sec, indicative of household ambulator General Gait Details: walked with O2 off, no drop below 88% during session   Stairs              Wheelchair Mobility    Modified Rankin (Stroke Patients Only)       Balance Overall balance assessment: Needs assistance Sitting-balance support: Feet supported Sitting balance-Leahy Scale: Fair     Standing balance support: Bilateral upper extremity supported;During functional activity Standing balance-Leahy Scale: Poor                              Cognition Arousal/Alertness: Awake/alert Behavior During Therapy: WFL for tasks assessed/performed Overall Cognitive Status: Within Functional Limits for tasks assessed                                        Exercises      General Comments General comments (skin integrity, edema, etc.): monitored sats with limited talking on the hall      Pertinent Vitals/Pain Pain Assessment: No/denies pain    Home Living                      Prior Function            PT Goals (current goals can now be found in the care plan section) Acute Rehab PT Goals Patient Stated Goal: to go home PT Goal Formulation: With patient Progress towards PT goals: Progressing toward goals    Frequency    Min 3X/week      PT Plan  Current plan remains appropriate    Co-evaluation              AM-PAC PT "6 Clicks" Mobility   Outcome Measure  Help needed turning from your back to your side while in a flat bed without using bedrails?: None Help needed moving from lying on your back to sitting on the side of a flat bed without using bedrails?: None Help needed moving to and from a bed to a chair (including a wheelchair)?: None Help needed standing up from a chair using your arms (e.g., wheelchair or bedside chair)?: A Little Help needed to walk in hospital room?: A Little Help needed climbing 3-5 steps with a railing? : A Little 6 Click Score: 21    End of Session Equipment Utilized During Treatment: Gait belt;Oxygen Activity Tolerance: Patient tolerated treatment well;Treatment limited  secondary to medical complications (Comment) Patient left: in bed;with call bell/phone within reach;with bed alarm set Nurse Communication: Mobility status PT Visit Diagnosis: Unsteadiness on feet (R26.81);Muscle weakness (generalized) (M62.81)     Time: 5364-6803 PT Time Calculation (min) (ACUTE ONLY): 33 min  Charges:  $Gait Training: 8-22 mins $Therapeutic Activity: 8-22 mins                 Ramond Dial 05/10/2020, 9:07 PM  Mee Hives, PT MS Acute Rehab Dept. Number: [facility] [place]

## 2020-05-11 ENCOUNTER — Inpatient Hospital Stay (HOSPITAL_COMMUNITY): Payer: Medicare PPO

## 2020-05-11 ENCOUNTER — Telehealth: Payer: Self-pay | Admitting: Physician Assistant

## 2020-05-11 DIAGNOSIS — E876 Hypokalemia: Secondary | ICD-10-CM | POA: Diagnosis not present

## 2020-05-11 DIAGNOSIS — I10 Essential (primary) hypertension: Secondary | ICD-10-CM | POA: Diagnosis not present

## 2020-05-11 DIAGNOSIS — J90 Pleural effusion, not elsewhere classified: Secondary | ICD-10-CM | POA: Diagnosis not present

## 2020-05-11 DIAGNOSIS — J9601 Acute respiratory failure with hypoxia: Secondary | ICD-10-CM | POA: Diagnosis not present

## 2020-05-11 LAB — CBC WITH DIFFERENTIAL/PLATELET
Abs Immature Granulocytes: 0.05 10*3/uL (ref 0.00–0.07)
Basophils Absolute: 0.1 10*3/uL (ref 0.0–0.1)
Basophils Relative: 1 %
Eosinophils Absolute: 0.3 10*3/uL (ref 0.0–0.5)
Eosinophils Relative: 3 %
HCT: 42.3 % (ref 36.0–46.0)
Hemoglobin: 14.6 g/dL (ref 12.0–15.0)
Immature Granulocytes: 1 %
Lymphocytes Relative: 12 %
Lymphs Abs: 1.3 10*3/uL (ref 0.7–4.0)
MCH: 30.4 pg (ref 26.0–34.0)
MCHC: 34.5 g/dL (ref 30.0–36.0)
MCV: 87.9 fL (ref 80.0–100.0)
Monocytes Absolute: 1.4 10*3/uL — ABNORMAL HIGH (ref 0.1–1.0)
Monocytes Relative: 12 %
Neutro Abs: 8.1 10*3/uL — ABNORMAL HIGH (ref 1.7–7.7)
Neutrophils Relative %: 71 %
Platelets: 237 10*3/uL (ref 150–400)
RBC: 4.81 MIL/uL (ref 3.87–5.11)
RDW: 14.9 % (ref 11.5–15.5)
WBC: 11.1 10*3/uL — ABNORMAL HIGH (ref 4.0–10.5)
nRBC: 0 % (ref 0.0–0.2)

## 2020-05-11 LAB — PHOSPHORUS: Phosphorus: 2.7 mg/dL (ref 2.5–4.6)

## 2020-05-11 LAB — COMPREHENSIVE METABOLIC PANEL
ALT: 17 U/L (ref 0–44)
AST: 19 U/L (ref 15–41)
Albumin: 2.4 g/dL — ABNORMAL LOW (ref 3.5–5.0)
Alkaline Phosphatase: 48 U/L (ref 38–126)
Anion gap: 7 (ref 5–15)
BUN: 12 mg/dL (ref 8–23)
CO2: 24 mmol/L (ref 22–32)
Calcium: 9.6 mg/dL (ref 8.9–10.3)
Chloride: 104 mmol/L (ref 98–111)
Creatinine, Ser: 0.81 mg/dL (ref 0.44–1.00)
GFR, Estimated: >60 mL/min (ref >60)
Glucose, Bld: 115 mg/dL — ABNORMAL HIGH (ref 70–99)
Potassium: 3.9 mmol/L (ref 3.5–5.1)
Sodium: 135 mmol/L (ref 135–145)
Total Bilirubin: 0.8 mg/dL (ref 0.3–1.2)
Total Protein: 5.2 g/dL — ABNORMAL LOW (ref 6.5–8.1)

## 2020-05-11 LAB — RESP PANEL BY RT-PCR (FLU A&B, COVID) ARPGX2
Influenza A by PCR: NEGATIVE
Influenza B by PCR: NEGATIVE
SARS Coronavirus 2 by RT PCR: NEGATIVE

## 2020-05-11 LAB — MAGNESIUM: Magnesium: 2.1 mg/dL (ref 1.7–2.4)

## 2020-05-11 MED ORDER — ACETAMINOPHEN 325 MG PO TABS: 650.0000 mg | ORAL_TABLET | Freq: Four times a day (QID) | ORAL | 0 refills | Status: DC | PRN

## 2020-05-11 MED ORDER — GUAIFENESIN-DM 100-10 MG/5ML PO SYRP: 5.0000 mL | ORAL_SOLUTION | ORAL | 0 refills | Status: DC | PRN

## 2020-05-11 NOTE — TOC Progression Note (Signed)
Transition of Care [facility]) - Progression Note    Patient Details  Name: Shelley Flores MRN: [id number] Date of Birth: 06/01/1932  Transition of Care [facility]) CM/SW Contact  Angelita Ingles, RN Phone Number: [phone]  05/11/2020, 1:13 PM  Clinical Narrative:    CM received a call from Amber at [facility]  To inform CM that the facility is hopeful that they will received plurex drain supplies today. CM has informed Amber that hospital will send 10 drain bottles with patient but facility wants to make sure they have supplies on hand prior to receiving patient. Amber will call CM as soon as supplies arrive.        Expected Discharge Plan and Services           Expected Discharge Date: 05/11/20                                     Social Determinants of Health (SDOH) Interventions    Readmission Risk Interventions No flowsheet data found.

## 2020-05-11 NOTE — Progress Notes (Signed)
Patient was drained today, so if she discharges today, she will be sent to [facility] with 9 bottles.

## 2020-05-11 NOTE — Progress Notes (Signed)
PleureX catheter drained . Pt tolerated well without pain or discomfort. Pt stated she felt slight pressure while draining. Drainage was amber in color & suture at insertion sight were intact. New sterile dressing applied.

## 2020-05-11 NOTE — Discharge Summary (Addendum)
Physician Discharge Summary  Shelley Flores JQB:[id number] DOB: 25-Dec-1932 DOA: 05/01/2020  PCP: Cari Caraway, MD  Admit date: 05/01/2020 Discharge date: 05/11/2020  Admitted From: ALF Disposition: SNF  Recommendations for Outpatient Follow-up:  1. Follow up with PCP in 1-2 weeks 2. Follow up with Pulmonary Dr. Valeta Harms on 05/22/20 at 2:00 pm 3. Follow up with Medical Oncology Dr. Julien Nordmann on 05/17/20 4. Please obtain CMP/CBC, Mag, Phos in one week 5. Repeat CXR within 1 week PER IR RECCS: for Drain Care-you do not drain pleurX regularly, you drain as needed. site care is to change dressings when you use it (not every day)Discharge instructions for pleurX use: 1- Drain PRN (when patient feels short of breath). 2- Change dressing when pleurX is used (or more if dressing saturated). 3- No bathing/swimming/showering with pleurX in place (ok to sponge bath around drain site). Arrange for home health nurse to drain pleurex catheter. Consult with RN to determine specific post-discharge drainage schedule. Drain daily, up to max of 1L until patient is only able to drain out 174ml. If < 170ml for 3 consecutive drains every other day, call Interventional Radiology ([phone]) for evaluation and possible removal  6. Please follow up on the following pending results:  Home Health: No  Equipment/Devices: None    Discharge Condition: Stable CODE STATUS: FULL CODE  Diet recommendation:   Brief/Interim Summary: HPI per Dr. Shela Flores on 05/01/20  IOX:BDZHGD Flores Forresteris a 86 y.o.femalewith medical history significant ofhypertension, hyperlipidemia, mild valvular heart disease presenting with complaints of shortness of breath.Patient states she has been coughing since the end of October and was seen by her doctor who thought the cough was related to her having acid reflux so she was started on Prilosec but her cough is persisted. For the past 10 days she has had dyspnea on exertion  and fatigue. She had an x-ray done on 12/17 which revealed a large left pleural effusion. She was supposed to get IR thoracentesis but it has not been scheduled yet. Since her dyspnea was worsening she came into the ED today to be evaluated. Denies fevers and states she has been fully vaccinated against Covid. Denies chest pain. Denies prior history of pleural effusion/thoracentesis. Denies history of malignancy. She smoked cigarettes for about 4 years back in her 83s when she was in graduate school but not since then. Denies unintentional weight loss. Reports having abdominal distention for several months. Also complaining of early satiety/loss of appetite. No other complaints.  ED Course:Afebrile. Not tachycardic. Tachypneic with respiratory rate in the 20s. Blood pressure elevated with systolic up to 924Q. WBC 13.4, hemoglobin 15.8, hematocrit 45.4, platelet 473K.Sodium 131, potassium 2.9, chloride 93, bicarb 25, BUN 15, creatinine 0.7, glucose 125. Magnesium 2.2. BNP 70. SARS-CoV-2 PCR test and influenza panel both negative. Blood culture x2 pending. Chest x-ray showing a large left pleural effusion with subtotal atelectasis of the left lung and mediastinal shift left to right, little change since 04/28/2020. Underwent thoracentesis for large left pleural effusion in the ED and 1500 cc fluid removed. Repeat chest x-ray showing interval decrease in size of a now small to moderate volume left pleural effusion; underlying infection, inflammation, pulmonary mass not excluded; trace right pleural effusion. Not hypoxic at rest but sats dropped to 90% with ambulation. She was given ceftriaxone and azithromycin. Also given oral potassium 40 mEq for hypokalemia. Admission requested for observation.  **Interim History  Patient underwent a left-sided thoracentesis x2 and drained 1.5 L of suggestive of an exudative effusion  each time.  She was recently seen in October by her doctor for  dyspnea and found to have a pleural effusion on admission. She was started on IV ceftriaxone and azithromycin and pulmonary is consulted for further evaluation recommendations given her very large pleural effusion. Cytology shows Malignant cells and patient has Adenocarcinoma. Currently awaiting Plure-X Catheter placement and after will discharge to SNF as PT recommends SNF now and she will follow up with Pulmonary on 05/23/19 and Medical Oncology on 05/18/19. LE Venous Duplex negative for DVT. Repeat CT Scan showed "Left upper lobe masslike consolidation consistent with malignancy. A portion of the mass invades the mediastinum at the level of the aortic or pulmonary window. PET-CT may be useful to delineate the mass from the postobstructive change. Prominence of the left upper lobe pulmonary interstitium, concerning for lymphangitic spread of disease. A subcarinal lymphadenopathy worrisome for nodal metastasis. Again, PET-CT may be useful. Small bilateral pleural effusions, partially loculated on the left. Decreased left effusion after recent thoracentesis.Aortic Atherosclerosis."  She is getting close to being discharged and she will have a Pleurx catheter placed yesterday.  Supplies will be ordered for facility and once this is placed she will have some drainage. She felt better today after her catheter site was placed and is not in much pain. Repeat chest x-ray shows improvement in her lungs. She'll need to follow-up with PCP, interventional radiology, medical oncology as well as pulmonary in outpatient setting and she is stable for discharge at this time.  Discharge Diagnoses:  Principal Problem:   Pleural effusion Active Problems:   Essential hypertension   Acute hypoxemic respiratory failure (HCC)   Hyponatremia   Hypokalemia  Acute Respiratory Failure with Hypoxia secondary to a Large Left Pleural Effusion  -Patient presenting with complaints of dyspnea.  -She had a chest x-ray done on 12/17  which revealed a large left pleural effusion.  -She was supposed to get IR thoracentesis but it was not scheduled as an outpatient.  -Chest x-ray done on admission and was showingalarge left pleural effusion with subtotal atelectasis of the left lung and mediastinal shift left to right, little change since 04/28/2020.  -Underwent thoracentesis for large left pleural effusion in the ED and 1500 cc fluid removed.  -Repeat chest x-ray 05/02/20 showing interval decrease in size of a now small to moderate volume left pleural effusion; underlying infection, inflammation, pulmonary mass not excluded; trace right pleural effusion.  -Echo done in November 2019 showing normal LVEF of 35-57%, grade 1 diastolic dysfunction, andno significant valvular disease. BNP normal.  -Based on pleural fluid analysis labs, pleural fluid/serum protein ratio0.60 and pleural fluid/serum LDH ratio of 0.87 consistent with an exudative effusion. -?Parapneumoniceffusion versus possible underlying malignancy.  -Pleural fluid glucose 101 and WBC count normal. SARS-CoV-2 PCR test negative.  -On admission she had a Mild leukocytosis on labs but no fever, tachycardia, or hypotension to suggest sepsis. Not hypoxic at rest but sats dropped to 90% with ambulation in the ED. 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 Tresa Moore in Pathology), and pleural fluid culture showed NGTD at 5Days. -Abx now stopped  -CT Scan ordered for further evaluation and showed "Moderate left and small right pleural effusions. The left pleural effusion demonstrates areas of conspicuous pleural thickening and small pleural nodularity. While this could reflect empyema, a malignant effusion with pleural deposits would present similarly. Furthermore, there is heterogeneous enhancement of the adjacent atelectatic lung parenchyma  including within the lingula,  anterior segment left upper lobe and anterior basal segment of the left lower lobe. Could reflect underlying airspace disease or malignancy with additional airspace disease seen throughout aerated portions of the left lung. Asymmetric soft tissue edema across the left chest wall. No clear extension of the pleural or airspace processes in to the chest wall proper. Possibly reactive though should with exam findings and continued attention on follow-up imaging is recommended. Some questionable thickening of the rectosigmoid though may be related to underdistention though some mild perirectal fat stranding is present however and could suggest an underlying proctitis. Aortic Atherosclerosis."  -Blood culture x2 Showed NGTD 5 Days. Continue to monitor WBC count as it is fluctuating and went from 13.4 -> 14.7 -> 11.5 -> 13.9 -> 11.5 -> 13.3 -> 12.7 -> 11.6 -> 12.2 -Check procalcitonin level and was <0.10. Abx now stopped -SpO2: 95 % O2 Flow Rate (L/min): 4 L/min; Now on and off of Supplemental O2 and will require it at D/C  -Continue supplemental oxygen via nasal cannula and wean O2 as tolerated -Continuous pulse Oximetry and Maintain oxygen saturation above 92%.  -ECHO done as below and showing EF of 60-65% and G1DD -Will consult Pulmonary for further evaluation and Recommendations and they are recommending a thoracentesis today.  Thoracentesis was done and she had 1.55 Liters removed again  -Repeat CXR today showed "Left drainage catheter present medially with interval drainage of most of the pleural effusion compared to 1 day prior. Small amount of residual effusion remains with atelectatic change in the left mid lower lung regions. Apparent consolidation abutting the left hilum is present. On the right, there is a small pleural effusion with slight right base atelectasis. Heart is upper normal in size with pulmonary vascularity normal. No adenopathy no bone lesions." -Pulmonary to weigh in further and have  recommended repeating a CT Scan of the Chest with Contrast and recommended calling [facility] to evaluate for VATS Pleurodesis vs Pleur-X Catheter Placement ; Will obtain LE Venous Duplex and this was Negative for DVT  -I spoke with Dr. Kipp Brood of [facility] and he recommends IR to place a Pleur-X Catheter; Dr. Rodena Piety spoke with IR and apparently IR will not place the Pleurx catheter until she is close to being discharged and has a disposition in place in terms of going to SNF.  Recommendation is for SNF and will have IR anticipate placing Pleurx cath the next few days; Timing of Pluer-X to be done by IR -I spoke with Dr. Kathlene Cote and Pleurx catheter was placed on 05/10/2020 -Repeat CT scan of the chest showed "Left upper lobe masslike consolidation consistent with malignancy. A portion of the mass invades the mediastinum at the level of the aortic or pulmonary window. PET-CT may be useful to delineate the mass from the postobstructive change. 2. Prominence of the left upper lobe pulmonary interstitium, concerning for lymphangitic spread of disease. 3. A subcarinal lymphadenopathy worrisome for nodal metastasis. Again, PET-CT may be useful. 4. Small bilateral pleural effusions, partially loculated on the left. Decreased left effusion after recent thoracentesis. 5. Aortic Atherosclerosis." -Because of this she also underwent a CT of the abdomen and pelvis with contrast to evaluate for Metastatsis which showed "No definite evidence of malignancy or metastatic disease in the abdomen or pelvis. Examination is generally unchanged compared to recent CT dated 05/02/2020.  No evidence of urologic malignancy per ordering indication. Definitively benign bilateral renal cysts for which no further characterization or follow-up is specifically required. Multiple  subcentimeter low-attenuation lesions of the liver, generally too small to confidently characterize, although the largest are clearly simple cysts, remaining smaller lesions  almost certainly incidental small cysts or hemangiomata. No findings suspicious for hepatic metastatic disease. There is redemonstrated mild rectal wall thickening and minimal perirectal fat stranding, again suggesting nonspecific proctitis. Correlate with referable signs and symptoms if present. Synchronous rectal malignancy would be difficult to strictly exclude. Consider digital and/or endoscopic examination. There is an unchanged 1.1 cm fluid low-attenuation lesion of the pancreatic tail. 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 There is no pancreatic ductal dilatation. Given new diagnosis of advanced stage lung malignancy, recommend attention on follow-up with future clinically indicated oncology imaging.  Small, left greater than right bilateral pleural effusions, similar to prior examination. Nodular thickening of the dependent left-sided parietal pleura. Findings are consistent with pleural metastatic disease as diagnosed by thoracentesis" -I also spoke with Dr. Benay Spice of Oncology who recommends outpatient follow up and patient will need Molecular Testing and additional workup including PET Scan; outpatient appointment with Dr. Julien Nordmann as already been scheduled for May 17, 2020 as well as with pulmonary Dr. Valeta Harms on May 22, 2020 -He is medically stable to be discharged now that she has a Pleurx catheter and will need to follow-up and repeat her chest x-ray within 1 to 2 weeks and continue supplemental oxygen wean as tolerated  Abdominal Distention and Early Satiety, in the setting of her malignancy as above -Patient reports history of abdominal distention for several months and also complaining of early satiety/loss of appetite.  -No complains of abdominal pain, nausea, vomiting. Abdomen does appear significantly distended on exam but nontender. ?Abdominal mass/ ascites. -Repeat CT Scan as above  -C/w PPI Pantoprazole 40 mg po twice daily and continue with home PPI with  omeprazole -Evaluated for Mass -Follow-up with medical oncology in outpatient setting and have PET scan done  Bilateral Lower Extremity Edema -BNP normal.She was previously on amlodipine which was stopped during cardiology visit last month. -Has 1+ mildly pitting edema of bilateral lower extremities but mostly non-pitting. -Needs some more IV Lasix -ECHOCardiogram showed Grade 1 DD and normal EF of 60-65% -Could be in the setting of poor Nutritional Status and Hypoalbuminemia  -Will obtain LE Venous Duplex to Flores/o DVT and was Negative for DVT  Hyponatremia -Mild -Sodium 131 on admission and is now improved to 137 yesterday and today is 135 -Possibly due to hypervolemia or could be due to home thiazide diuretic use. -We'll need to watch carefully as we are resuming her home hydrochlorothiazide at discharge -Repeat CMP within 1 week  Hypokalemia -Potassium 2.9 on admission and improved to 3.9 -Likely related to home diuretic use and will need to be careful in resuming her home hydrochlorothiazide and evaluate closely -Continue to Monitor and Replete as Necessary -Repeat CMP in the AM   Hypertension -Stable. -Amlodipine was discontinued during recent cardiology visit due to patient having worsening lower extremity edema and will continue to Hold -Continue Metoprolol Succinate 50 mg po daily -Continue to Monitor BP per Protocol -Last BP reading was 144/69  Hyperlipidemia -C/w Atorvastatin 20 mg po daily   PVCs -C/w telemetry Monitoring -C/w Metoprolol Succinate 50 mg po Daily  Hypophosphatemia -Patient's Phos Level was 3.0  -Continue to Monitor and Replete as Necessary -Repeat Phos Level in the AM    Left Arm IV Infiltration Flores/o Phlebitis  -Warm Compresses -Area was erythematous and slightly boggy -If persists will get an U/S of the ARM  -  Elevation of the Extremity   GERD -Continue PPI with Prilosec Substitution of po Pantoprazole 40 mg po BID daily    Discharge Instructions  Discharge Instructions    Ambulatory Pleural Drainage Schedule   Complete by: As directed    Drain daily, up to max of 1L until patient is only able to drain out 183ml. If <122ml for 3 consecutive drains every other day, then call Interventional Radiology [phone]) for evaluation and possible removal.   Call MD for:  difficulty breathing, headache or visual disturbances   Complete by: As directed    Call MD for:  extreme fatigue   Complete by: As directed    Call MD for:  hives   Complete by: As directed    Call MD for:  persistant dizziness or light-headedness   Complete by: As directed    Call MD for:  persistant nausea and vomiting   Complete by: As directed    Call MD for:  redness, tenderness, or signs of infection (pain, swelling, redness, odor or green/yellow discharge around incision site)   Complete by: As directed    Call MD for:  severe uncontrolled pain   Complete by: As directed    Call MD for:  temperature >100.4   Complete by: As directed    Diet - low sodium heart healthy   Complete by: As directed    Discharge instructions   Complete by: As directed    You were cared for by a hospitalist during your hospital stay. If you have any questions about your discharge medications or the care you received while you were in the hospital after you are discharged, you can call the unit and ask to speak with the hospitalist on call if the hospitalist that took care of you is not available. Once you are discharged, your primary care physician will handle any further medical issues. Please note that NO REFILLS for any discharge medications will be authorized once you are discharged, as it is imperative that you return to your primary care physician (or establish a relationship with a primary care physician if you do not have one) for your aftercare needs so that they can reassess your need for medications and monitor your lab values.  Follow up with  PCP, Medical Oncology, Interventional Radiology, and Pulmonary in the outpatient setting. Repeat CXR within 1 week. Take all medications as prescribed. If symptoms change or worsen please return to the ED for evaluation   Discharge wound care:   Complete by: As directed    Drain Care per IR Recommendations   Increase activity slowly   Complete by: As directed      Allergies as of 05/11/2020      Reactions   Irbesartan Nausea Only      Medication List    TAKE these medications   acetaminophen 325 MG tablet Commonly known as: TYLENOL Take 2 tablets (650 mg total) by mouth every 6 (six) hours as needed for mild pain (or Fever >/= 101).   alendronate 70 MG tablet Commonly known as: FOSAMAX Take 70 mg by mouth every Saturday.   amLODipine 5 MG tablet Commonly known as: NORVASC Take 5 mg by mouth daily.   aspirin 81 MG tablet Take 81 mg by mouth daily.   atorvastatin 20 MG tablet Commonly known as: LIPITOR TAKE 1 TABLET BY MOUTH EVERY DAY What changed: when to take this   BENZONATATE PO Take 1 capsule by mouth 3 (three) times daily.   Glucosamine  HCl 1000 MG Tabs Take 1,000 mg by mouth daily.   guaiFENesin-dextromethorphan 100-10 MG/5ML syrup Commonly known as: ROBITUSSIN DM Take 5 mLs by mouth every 4 (four) hours as needed for cough.   hydrochlorothiazide 12.5 MG capsule Commonly known as: MICROZIDE TAKE 1 CAPSULE BY MOUTH EVERY DAY What changed: how much to take   metoprolol succinate 50 MG 24 hr tablet Commonly known as: TOPROL-XL TAKE 1 TABLET (50 MG TOTAL) BY MOUTH DAILY. TAKE WITH OR IMMEDIATELY FOLLOWING A MEAL.   multivitamin tablet Take 1 tablet by mouth daily.   PriLOSEC OTC 20 MG tablet Generic drug: omeprazole Take 20 mg by mouth 2 (two) times daily before a meal.   Viactiv Calcium Plus D 650-12.5-40 MG-MCG-MCG Chew Generic drug: Calcium-Vitamin D-Vitamin K Chew 1 tablet by mouth daily.            Durable Medical Equipment  (From  admission, onward)         Start     Ordered   05/11/20 0934  For home use only DME oxygen  Once       Question Answer Comment  Length of Need 6 Months   Mode or (Route) Nasal cannula   Liters per Minute 2   Oxygen delivery system Gas      05/11/20 0933           Discharge Care Instructions  (From admission, onward)         Start     Ordered   05/11/20 0000  Discharge wound care:       Comments: Drain Care per IR Recommendations   05/11/20 0930          Follow-up Information    Cari Caraway, MD Follow up.   Specialty: Family Medicine Contact information: [place] [address] [phone]        June Leap L, DO Follow up.   Specialty: Pulmonary Disease Why: Appointment Scheduled for 05/22/20 at 2:00 pm Contact information: [place] [address] [phone]        Curt Bears, MD Follow up.   Specialty: Oncology Why: Appointment scheduled for 05/17/20 with Dr. Julien Nordmann and [facility] information: [address] [place] [address] [phone]              Allergies  Allergen Reactions  . Irbesartan Nausea Only    Consultations:  Pulmonary  Interventional Radiology  Discussed with Medical Oncology Dr. Julieanne Manson  Procedures/Studies: DG Chest 1 View  Result Date: 05/06/2020 CLINICAL DATA:  Shortness of breath. EXAM: CHEST  1 VIEW COMPARISON:  05/05/2020 FINDINGS: 0926 hours. Interval progression of left parahilar and retrocardiac basilar collapse/consolidative opacity. Left pleural effusion noted. There is probable some minimal atelectasis at the right base with tiny right pleural effusion. IMPRESSION: 1. Interval progression of left parahilar and retrocardiac basilar collapse/consolidative opacity. 2. Left pleural effusion. 3. Similar appearance of minimal atelectasis/infiltrate at the right base with tiny right effusion. Electronically Signed   By: Misty Stanley  M.D.   On: 05/06/2020 09:33   DG Chest 1 View  Result Date: 05/04/2020 CLINICAL DATA:  Post thoracentesis on the left. EXAM: CHEST  1 VIEW COMPARISON:  Radiographs earlier today and 05/03/2020. CT 05/02/2020. FINDINGS: 1416 hours. Interval decreased size of left pleural effusion following thoracentesis. There is improved aeration of the left lung with persistent left perihilar opacity which may reflect a mass or infiltrate. No pneumothorax. The right lung remains clear. There  is a stable small right pleural effusion. The heart size and mediastinal contours are stable. IMPRESSION: 1. Interval decreased size of left pleural effusion following thoracentesis with improved aeration of the left lung. No pneumothorax. 2. Persistent left perihilar opacity may reflect a mass or infiltrate. Electronically Signed   By: Richardean Sale M.D.   On: 05/04/2020 14:49   DG Chest 2 View  Result Date: 05/03/2020 CLINICAL DATA:  Shortness of breath, pleural effusion EXAM: CHEST - 2 VIEW COMPARISON:  CT chest, 05/02/2020, chest radiographs, 05/01/2020 FINDINGS: Large left, small right pleural effusions with associated atelectasis or consolidation are not significantly changed compared to prior examination. No new airspace opacity. The cardiac borders are largely obscured. Osseous structures are unremarkable. IMPRESSION: Large left, small right pleural effusions with associated atelectasis or consolidation are not significantly changed compared to prior examination. No new airspace opacity. Electronically Signed   By: Eddie Candle M.D.   On: 05/03/2020 08:22   DG Chest 2 View  Result Date: 05/01/2020 CLINICAL DATA:  Shortness of breath for 5 days, diagnosed with a LEFT pleural effusion on Friday, history hypertension, former smoker EXAM: CHEST - 2 VIEW COMPARISON:  04/28/2020 FINDINGS: Subtotal opacification of the LEFT hemithorax by large LEFT pleural effusion. Significant atelectasis of LEFT lung and mediastinal shift  LEFT to RIGHT. Small RIGHT pleural effusion identified. Heart size poorly seen. No pneumothorax or RIGHT lung consolidation identified. Bones demineralized. IMPRESSION: Large LEFT pleural effusion with subtotal atelectasis of LEFT lung and mediastinal shift LEFT to RIGHT little changed since 04/28/2020. Electronically Signed   By: Lavonia Dana M.D.   On: 05/01/2020 16:54   DG Chest 2 View  Result Date: 04/28/2020 CLINICAL DATA:  Chronic cough and shortness of breath EXAM: CHEST - 2 VIEW COMPARISON:  April 21, 2020 FINDINGS: There is a large left pleural effusion. There may well be a degree of underlying atelectasis and/or consolidation on the left. There is a minimal right pleural effusion. Right lung otherwise clear. Heart size is normal. Pulmonary vascular on the right is normal. There is distortion of pulmonary vascular on the left due to the large effusion. No adenopathy appreciable. No bone lesions. IMPRESSION: Large left pleural effusion occupying much of the left hemithorax, similar to recent prior study. Question underlying atelectasis and/or consolidation on the left. Right lung clear except for minimal right pleural effusion. Heart size normal. These results will be called to the ordering clinician or representative by the Radiologist Assistant, and communication documented in the PACS or [employer]. Electronically Signed   By: Lowella Grip III M.D.   On: 04/28/2020 12:51   CT CHEST W CONTRAST  Result Date: 05/04/2020 CLINICAL DATA:  Pleural effusion, left thoracentesis, abnormal chest x-ray EXAM: CT CHEST WITH CONTRAST TECHNIQUE: Multidetector CT imaging of the chest was performed during intravenous contrast administration. CONTRAST:  40mL OMNIPAQUE IOHEXOL 300 MG/ML  SOLN COMPARISON:  05/02/2020 FINDINGS: Cardiovascular: The heart is unremarkable without pericardial effusion. There is normal caliber of the thoracic aorta with no aneurysm or dissection. Minimal atherosclerosis. No  evidence of pulmonary embolus. There is extrinsic compression of the left upper lobe pulmonary artery. Mediastinum/Nodes: Subcarinal adenopathy measuring 14 mm in short axis. Thyroid, trachea, and esophagus are unremarkable. Lungs/Pleura: There is masslike consolidation of the left upper lobe abutting the mediastinum, measuring 4.5 x 4.1 x 4.6 cm (see image 61/3 and image 59/5). Medial extent of this mass invades the mediastinum at the level of the aortic or pulmonary window, reference image 57/3 and  59/5. Findings are consistent with underlying malignancy and postobstructive change. PET CT may better delineate the mass from the postobstructive change. Bronchoscopy may be useful for tissue diagnosis if recent thoracentesis was nondiagnostic for malignancy. There are small bilateral pleural effusions. On the left, there is a small loculated component in the anterior left hemithorax adjacent to the left upper lobe mass described above. Scattered hypoventilatory changes are seen at the lung bases and within the lingula. Interstitial prominence within the left upper lobe could reflect lymphangitic spread of disease. Upper Abdomen: Complex left renal cysts are unchanged. No acute upper abdominal process. Musculoskeletal: No acute or destructive bony lesions. Reconstructed images demonstrate no additional findings. IMPRESSION: 1. Left upper lobe masslike consolidation consistent with malignancy. A portion of the mass invades the mediastinum at the level of the aortic or pulmonary window. PET-CT may be useful to delineate the mass from the postobstructive change. 2. Prominence of the left upper lobe pulmonary interstitium, concerning for lymphangitic spread of disease. 3. A subcarinal lymphadenopathy worrisome for nodal metastasis. Again, PET-CT may be useful. 4. Small bilateral pleural effusions, partially loculated on the left. Decreased left effusion after recent thoracentesis. 5.  Aortic Atherosclerosis (ICD10-I70.0).  Electronically Signed   By: Randa Ngo M.D.   On: 05/04/2020 23:09   CT ABDOMEN PELVIS W CONTRAST  Result Date: 05/05/2020 CLINICAL DATA:  Urologic cancer surveillance EXAM: CT ABDOMEN AND PELVIS WITH CONTRAST TECHNIQUE: Multidetector CT imaging of the abdomen and pelvis was performed using the standard protocol following bolus administration of intravenous contrast. CONTRAST:  17mL OMNIPAQUE IOHEXOL 300 MG/ML  SOLN COMPARISON:  CT chest, 05/04/2020, CT chest abdomen pelvis, 05/02/2020 FINDINGS: Lower chest: Small, left greater than right bilateral pleural effusions, similar to prior examination. Nodular thickening of the dependent left-sided parietal pleura (series 3, image 11). Hepatobiliary: Multiple subcentimeter fluid and low-attenuation lesions of the liver, not significant changed compared to prior examination, and most likely benign incidental cysts and/or hemangiomata. No gallstones, gallbladder wall thickening, or biliary dilatation. Pancreas: There is an unchanged 1.1 cm fluid low-attenuation lesion of the pancreatic tail (series 3, image 26). No pancreatic ductal dilatation or surrounding inflammatory changes. Spleen: Normal in size without significant abnormality. Adrenals/Urinary Tract: Adrenal glands are unremarkable. There are multiple bilateral simple renal cysts, the largest in the midportion of the left kidney and inferior pole of the right kidney. Kidneys are otherwise normal, without renal calculi, solid lesion, or hydronephrosis. Bladder is unremarkable. Stomach/Bowel: Stomach is within normal limits. Appendix is not clearly visualized and may be diminutive or surgically absent. There is redemonstrated mild rectal wall thickening and minimal perirectal fat stranding (series 3, image 69). Vascular/Lymphatic: Aortic atherosclerosis. No enlarged abdominal or pelvic lymph nodes. Reproductive: Status post hysterectomy. Other: No abdominal wall hernia or abnormality. No abdominopelvic  ascites. Musculoskeletal: No acute or significant osseous findings. IMPRESSION: 1. No definite evidence of malignancy or metastatic disease in the abdomen or pelvis. Examination is generally unchanged compared to recent CT dated 05/02/2020. 2. No evidence of urologic malignancy per ordering indication. Definitively benign bilateral renal cysts for which no further characterization or follow-up is specifically required. 3. Multiple subcentimeter low-attenuation lesions of the liver, generally too small to confidently characterize, although the largest are clearly simple cysts, remaining smaller lesions almost certainly incidental small cysts or hemangiomata. No findings suspicious for hepatic metastatic disease. 4. There is redemonstrated mild rectal wall thickening and minimal perirectal fat stranding, again suggesting nonspecific proctitis. Correlate with referable signs and symptoms if present. Synchronous rectal malignancy  would be difficult to strictly exclude. Consider digital and/or endoscopic examination. 5. There is an unchanged 1.1 cm fluid low-attenuation lesion of the pancreatic tail. 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 There is no pancreatic ductal dilatation. Given new diagnosis of advanced stage lung malignancy, recommend attention on follow-up with future clinically indicated oncology imaging. 6. Small, left greater than right bilateral pleural effusions, similar to prior examination. Nodular thickening of the dependent left-sided parietal pleura. 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 Aortic Atherosclerosis (ICD10-I70.0). Electronically Signed   By: Eddie Candle M.D.   On: 05/05/2020 19:22   CT CHEST ABDOMEN PELVIS W CONTRAST  Addendum Date: 05/02/2020   ADDENDUM REPORT: 05/02/2020 04:13 ADDENDUM: These results were called by telephone at the time of interpretation on 05/02/2020 at 4:13 am to provider [facility] , who verbally  acknowledged these results. Electronically Signed   By: Lovena Le M.D.   On: 05/02/2020 04:13   Result Date: 05/02/2020 CLINICAL DATA:  Pleural effusion, malignancy suspected EXAM: CT CHEST, ABDOMEN, AND PELVIS WITH CONTRAST TECHNIQUE: Multidetector CT imaging of the chest, abdomen and pelvis was performed following the standard protocol during bolus administration of intravenous contrast. CONTRAST:  136mL OMNIPAQUE IOHEXOL 300 MG/ML  SOLN COMPARISON:  Radiograph 05/02/2019 FINDINGS: CT CHEST FINDINGS Cardiovascular: The aortic root is suboptimally assessed given cardiac pulsation artifact. Atherosclerotic plaque within the normal caliber aorta. No acute luminal abnormality of the imaged aorta. No periaortic stranding or hemorrhage. Normal 3 vessel branching of the aortic arch. Proximal great vessels are mildly calcified but otherwise unremarkable. Cardiac size is within normal limits. Trace pericardial effusion. Coronary artery calcifications. Central pulmonary arteries are normal caliber. No large central filling defects are present with more distal evaluation limited on this non tailored examination of the pulmonary arteries. Mediastinum/Nodes: Scattered conspicuous mediastinal nodes including several enlarged nodes towards the cardiac apex measuring up to 10 mm short axis (3/46) additional posterior mediastinal nodes are present including a left periaortic node measuring 8 mm (3/43 and several subpleural nodules, likely small lymph nodes (3/36) subcarinal lymph node measures up to 16 mm (3/26). Lungs/Pleura: Moderate left and small right pleural effusions. The left pleural effusion demonstrates areas of conspicuous pleural thickening (3/34) and small pleural nodules (3/23). While there are adjacent areas of passive atelectatic change heterogeneous enhancement of the atelectatic lung parenchyma particularly within collapsed portions of lingula, anterior segment left upper lobe and anterior basal segment of  the left lower raise concern for underlying airspace disease or malignancy. Aerated portions of the left upper and lower lobe demonstrate additional mixed ground-glass and consolidative opacity worrisome for are further airspace disease. Relative sparing in the aerated portions of the right lung. Musculoskeletal: There is asymmetric soft tissue edema across the left chest wall. No clear extension of the pleural or airspace processes in to the chest wall proper. No acute or worrisome osseous lesions. Heterogeneously dense breast tissue without focal breast nodularity or masses. Few benign macro calcifications. Multilevel degenerative changes in the spine and shoulders. Dextrocurvature of the thoracic spine. CT ABDOMEN PELVIS FINDINGS Hepatobiliary: Few scattered subcentimeter hypoattenuating foci are present in the liver, largest in the left lobe measuring up to 8 mm (3/51). Too small to fully characterize on CT imaging. No other focal concerning liver lesions. Gallbladder is unremarkable. No pericholecystic fluid or inflammation. No visible intraductal gallstones or biliary dilatation. Pancreas: No pancreatic ductal dilatation or surrounding inflammatory changes. Spleen: Few punctate hypoattenuating foci in the spleen are too  small to characterize. No worrisome focal splenic lesions. Normal splenic size. Adrenals/Urinary Tract: No concerning adrenal nodules or masses. Multiple fluid attenuation cysts are present in both kidneys, largest are present in the upper pole left kidney measuring up to 7.4 cm with some thin peripheral mural calcification (Bosniak 2). No concerning focal renal lesions. No urolithiasis or hydronephrosis. Urinary bladder is largely decompressed at the time of exam and therefore poorly evaluated by CT imaging. No gross bladder abnormality. Stomach/Bowel: Mild thickening at the gastric antrum is likely related to normal peristaltic motion. Duodenum is unremarkable. High attenuation enteric  contrast material partially traverses the colon. No small bowel thickening or dilatation. No proximal colonic abnormalities are identified. Some questionable thickening of the rectosigmoid though may be related to underdistention. However some perirectal fat stranding is present which is nonspecific. No evidence of obstruction. Appendix is not visualized. No focal inflammation the vicinity of the cecum to suggest an occult appendicitis. Vascular/Lymphatic: Atherosclerotic calcifications within the abdominal aorta and branch vessels. No aneurysm or ectasia. No enlarged abdominopelvic lymph nodes. Reproductive: Patient appears to be post hysterectomy. No concerning adnexal lesion. Other: Mild presacral fat stranding and trace fluid in the pelvis, possibly reactive fluid status or reactive change. No free air. Mild body wall edema most pronounced over the left flank and hip. No bowel containing hernias. Musculoskeletal: Multilevel degenerative changes are present in the imaged portions of the spine. Stepwise retrolisthesis L1-L4. Levocurvature of the lumbar spine. No spondylolysis. Additional degenerative changes in the hips and pelvis. No acute osseous abnormality or suspicious osseous lesion. Sclerotic focus in the right ilium (6/53) is likely bone island. IMPRESSION: 1. Moderate left and small right pleural effusions. The left pleural effusion demonstrates areas of conspicuous pleural thickening and small pleural nodularity. While this could reflect empyema, a malignant effusion with pleural deposits would present similarly. Furthermore, there is heterogeneous enhancement of the adjacent atelectatic lung parenchyma including within the lingula, anterior segment left upper lobe and anterior basal segment of the left lower lobe. Could reflect underlying airspace disease or malignancy with additional airspace disease seen throughout aerated portions of the left lung. 2. Asymmetric soft tissue edema across the left  chest wall. No clear extension of the pleural or airspace processes in to the chest wall proper. Possibly reactive though should with exam findings and continued attention on follow-up imaging is recommended. 3. Some questionable thickening of the rectosigmoid though may be related to underdistention though some mild perirectal fat stranding is present however and could suggest an underlying proctitis. 4. Aortic Atherosclerosis (ICD10-I70.0). Currently attempting to contact the ordering provider with a critical value result. Addendum will be submitted upon case discussion. Electronically Signed: By: Lovena Le M.D. On: 05/02/2020 04:04   DG CHEST PORT 1 VIEW  Result Date: 05/11/2020 CLINICAL DATA:  Shortness of breath with cough EXAM: PORTABLE CHEST 1 VIEW COMPARISON:  May 10, 2020 FINDINGS: Left drainage catheter present medially with interval drainage of most of the pleural effusion compared to 1 day prior. Small amount of residual effusion remains with atelectatic change in the left mid lower lung regions. Apparent consolidation abutting the left hilum is present. On the right, there is a small pleural effusion with slight right base atelectasis. Heart is upper normal in size with pulmonary vascularity normal. No adenopathy no bone lesions. IMPRESSION: Most of pleural fluid drain on the left following drainage catheter placement. No pneumothorax. Consolidation overlying the left hilum/perihilar region again noted. Stable cardiac silhouette. Small right pleural effusion with mild  right base atelectasis. Right lung otherwise clear. Electronically Signed   By: Lowella Grip III M.D.   On: 05/11/2020 08:04   DG CHEST PORT 1 VIEW  Result Date: 05/10/2020 CLINICAL DATA:  Short of breath EXAM: PORTABLE CHEST 1 VIEW COMPARISON:  05/09/2020 FINDINGS: Extensive consolidation left lower lobe with mild progression, with associated pleural effusion. No pneumothorax Mild right lower lobe airspace disease  and small right effusion unchanged. Negative for heart failure or edema. IMPRESSION: Extensive consolidation left lower lobe with mild progression. Left pleural effusion not well visualized due to consolidation. Mild right lower lobe airspace disease unchanged. Electronically Signed   By: Franchot Gallo M.D.   On: 05/10/2020 08:07   DG CHEST PORT 1 VIEW  Result Date: 05/09/2020 CLINICAL DATA:  Shortness of breath. EXAM: PORTABLE CHEST 1 VIEW COMPARISON:  Chest x-ray 05/06/2020 and CT chest 05/05/2020. FINDINGS: Stable left hilar mass with progressive surrounding airspace consolidation and near complete left lower lobe atelectasis. Suspect enlarging left pleural effusion. Stable small right pleural effusion. The right lung remains relatively clear. IMPRESSION: Stable left hilar mass with progressive surrounding airspace consolidation and near complete left lower lobe/lingular atelectasis. Suspect enlarging left pleural effusion. Electronically Signed   By: Marijo Sanes M.D.   On: 05/09/2020 09:25   DG CHEST PORT 1 VIEW  Result Date: 05/05/2020 CLINICAL DATA:  SOB. Hx of valvular heart disease and HTN EXAM: PORTABLE CHEST 1 VIEW COMPARISON:  05/04/2020 FINDINGS: Normal cardiac silhouette. Dense streaky LEFT lobe opacity increased from prior. Obscuration of the heart border. Small LEFT effusion. RIGHT lung is clear. Lungs are hyperinflated. No pneumothorax IMPRESSION: LEFT lower lobe and lingular pneumonia and probable effusion. Electronically Signed   By: Suzy Bouchard M.D.   On: 05/05/2020 09:04   DG CHEST PORT 1 VIEW  Result Date: 05/04/2020 CLINICAL DATA:  Shortness of breath.  Pleural effusion. EXAM: PORTABLE CHEST 1 VIEW COMPARISON:  May 03, 2020. FINDINGS: Increased size of a large left pleural effusion. Overlying left opacities. Similar versus slightly increased small right pleural effusion. No visible pneumothorax. Cardiac silhouette is largely obscured. No acute osseous abnormality.  IMPRESSION: 1. Increased large left pleural effusion. Overlying left opacities may represent pneumonia and/or atelectasis. 2. Similar versus slightly increased small right pleural effusion. Electronically Signed   By: Margaretha Sheffield MD   On: 05/04/2020 08:10   DG Chest Portable 1 View  Result Date: 05/01/2020 CLINICAL DATA:  Shortness of breath, status post thoracentesis. EXAM: PORTABLE CHEST 1 VIEW COMPARISON:  Chest x-ray 05/01/2020 FINDINGS: The heart size and mediastinal contours are not well visualized due to overlying pleural effusion and pulmonary changes. These airspace opacity of the left lung. No pulmonary edema. Interval decrease in a now small to moderate volume left pleural effusion. Persistent trace right pleural effusion. No pneumothorax. No acute osseous abnormality. IMPRESSION: 1. Interval decrease in size of a now small to moderate volume left pleural effusion. Underlying infection, inflammation, pulmonary mass not excluded. 2. Trace right pleural effusion. Electronically Signed   By: Iven Finn M.D.   On: 05/01/2020 20:50   ECHOCARDIOGRAM COMPLETE  Result Date: 05/04/2020    ECHOCARDIOGRAM REPORT   Patient Name:   Shelley Flores Date of Exam: 05/04/2020 Medical Rec #:  [id number]          Height:       64.0 in Accession #:    [id number]         Weight:       142.6 lb Date  of Birth:  1932-12-14           BSA:          1.695 m Patient Age:    14 years           BP:           150/66 mmHg Patient Gender: F                  HR:           88 bpm. Exam Location:  Inpatient Procedure: 2D Echo, Cardiac Doppler and Color Doppler Indications:    Dyspnea  History:        Patient has prior history of Echocardiogram examinations, most                 recent 03/27/2018. Signs/Symptoms:Shortness of Breath; Risk                 Factors:Hypertension, Dyslipidemia and Former Smoker. Large left                 pleural effusion.  Sonographer:    Dustin Flock Referring Phys: [phone] MURALI  RAMASWAMY  Sonographer Comments: Technically challenging study due to limited acoustic windows. Difficult windows due to large pleural effusion. IMPRESSIONS  1. Left ventricular ejection fraction, by estimation, is 60 to 65%. The left ventricle has normal function. The left ventricle has no regional wall motion abnormalities. Left ventricular diastolic parameters are consistent with Grade I diastolic dysfunction (impaired relaxation). Elevated left ventricular end-diastolic pressure.  2. Right ventricular systolic function is normal. The right ventricular size is normal.  3. The mitral valve is degenerative. No evidence of mitral valve regurgitation. No evidence of mitral stenosis. Moderate mitral annular calcification.  4. The aortic valve is normal in structure. Aortic valve regurgitation is not visualized. No aortic stenosis is present.  5. The inferior vena cava is normal in size with greater than 50% respiratory variability, suggesting right atrial pressure of 3 mmHg. FINDINGS  Left Ventricle: Left ventricular ejection fraction, by estimation, is 60 to 65%. The left ventricle has normal function. The left ventricle has no regional wall motion abnormalities. The left ventricular internal cavity size was normal in size. There is  no left ventricular hypertrophy. Left ventricular diastolic parameters are consistent with Grade I diastolic dysfunction (impaired relaxation). Elevated left ventricular end-diastolic pressure. Right Ventricle: The right ventricular size is normal. No increase in right ventricular wall thickness. Right ventricular systolic function is normal. Left Atrium: Left atrial size was normal in size. Right Atrium: Right atrial size was normal in size. Pericardium: There is no evidence of pericardial effusion. Mitral Valve: The mitral valve is degenerative in appearance. Moderate mitral annular calcification. No evidence of mitral valve regurgitation. No evidence of mitral valve stenosis.  Tricuspid Valve: The tricuspid valve is normal in structure. Tricuspid valve regurgitation is not demonstrated. No evidence of tricuspid stenosis. Aortic Valve: The aortic valve is normal in structure. Aortic valve regurgitation is not visualized. No aortic stenosis is present. Pulmonic Valve: The pulmonic valve was normal in structure. Pulmonic valve regurgitation is not visualized. No evidence of pulmonic stenosis. Aorta: The aortic root is normal in size and structure. Venous: The inferior vena cava is normal in size with greater than 50% respiratory variability, suggesting right atrial pressure of 3 mmHg. IAS/Shunts: No atrial level shunt detected by color flow Doppler.  LEFT VENTRICLE PLAX 2D LVIDd:         3.30 cm Diastology LVIDs:  1.60 cm LV e' medial:    4.03 cm/s LV PW:         1.00 cm LV E/e' medial:  19.9 LV IVS:        1.00 cm LV e' lateral:   5.00 cm/s                        LV E/e' lateral: 16.0  RIGHT VENTRICLE RV Basal diam:  2.20 cm RV S prime:     2.50 cm/s TAPSE (M-mode): 2.0 cm LEFT ATRIUM             Index       RIGHT ATRIUM          Index LA Vol (A2C):   26.5 ml 15.64 ml/m RA Area:     8.27 cm LA Vol (A4C):   23.7 ml 13.99 ml/m RA Volume:   15.00 ml 8.85 ml/m LA Biplane Vol: 26.8 ml 15.82 ml/m  AORTIC VALVE LVOT Vmax:   120.00 cm/s LVOT Vmean:  84.000 cm/s LVOT VTI:    0.220 m  AORTA Ao Root diam: 2.60 cm MITRAL VALVE MV Area (PHT): 3.60 cm     SHUNTS MV Decel Time: 211 msec     Systemic VTI: 0.22 m MV E velocity: 80.20 cm/s MV A velocity: 127.00 cm/s MV E/A ratio:  0.63 Skeet Latch MD Electronically signed by Skeet Latch MD Signature Date/Time: 05/04/2020/11:23:20 AM    Final    VAS [place] LOWER EXTREMITY VENOUS (DVT)  Result Date: 05/04/2020  Lower Venous DVT Study Indications: Dyspnea and fatigue x 10 days.  Risk Factors: Recent pleural effusion and thoracentesis w/ suspected malignancy. Performing Technologist: Rogelia Rohrer  Examination Guidelines: A complete  evaluation includes B-mode imaging, spectral Doppler, color Doppler, and power Doppler as needed of all accessible portions of each vessel. Bilateral testing is considered an integral part of a complete examination. Limited examinations for reoccurring indications may be performed as noted. The reflux portion of the exam is performed with the patient in reverse Trendelenburg.  +---------+---------------+---------+-----------+----------+--------------+ RIGHT    CompressibilityPhasicitySpontaneityPropertiesThrombus Aging +---------+---------------+---------+-----------+----------+--------------+ CFV      Full           Yes      Yes                                 +---------+---------------+---------+-----------+----------+--------------+ SFJ      Full                                                        +---------+---------------+---------+-----------+----------+--------------+ FV Prox  Full           Yes      Yes                                 +---------+---------------+---------+-----------+----------+--------------+ FV Mid   Full           Yes      Yes                                 +---------+---------------+---------+-----------+----------+--------------+ FV DistalFull  Yes      Yes                                 +---------+---------------+---------+-----------+----------+--------------+ PFV      Full                                                        +---------+---------------+---------+-----------+----------+--------------+ POP      Full           Yes      Yes                                 +---------+---------------+---------+-----------+----------+--------------+ PTV      Full                                                        +---------+---------------+---------+-----------+----------+--------------+ PERO     Full                                                         +---------+---------------+---------+-----------+----------+--------------+   +---------+---------------+---------+-----------+----------+--------------+ LEFT     CompressibilityPhasicitySpontaneityPropertiesThrombus Aging +---------+---------------+---------+-----------+----------+--------------+ CFV      Full           Yes      Yes                                 +---------+---------------+---------+-----------+----------+--------------+ SFJ      Full                                                        +---------+---------------+---------+-----------+----------+--------------+ FV Prox  Full           Yes      Yes                                 +---------+---------------+---------+-----------+----------+--------------+ FV Mid   Full           Yes      Yes                                 +---------+---------------+---------+-----------+----------+--------------+ FV DistalFull           Yes      Yes                                 +---------+---------------+---------+-----------+----------+--------------+ PFV      Full                                                        +---------+---------------+---------+-----------+----------+--------------+  POP      Full           Yes      Yes                                 +---------+---------------+---------+-----------+----------+--------------+ PTV      Full                                                        +---------+---------------+---------+-----------+----------+--------------+ PERO     Full                                                        +---------+---------------+---------+-----------+----------+--------------+     Summary: RIGHT: - There is no evidence of deep vein thrombosis in the lower extremity.  - No cystic structure found in the popliteal fossa.  LEFT: - There is no evidence of deep vein thrombosis in the lower extremity.  - No cystic structure found in the popliteal fossa.   *See table(s) above for measurements and observations. Electronically signed by Monica Martinez MD on 05/04/2020 at 7:55:52 PM.    Final    IR THORACENTESIS ASP PLEURAL SPACE W/IMG GUIDE  Result Date: 05/04/2020 INDICATION: Patient with history of acute hypoxic respiratory failure secondary to recurrent left pleural effusion. Request is made for diagnostic and therapeutic left thoracentesis. EXAM: ULTRASOUND GUIDED DIAGNOSTIC AND THERAPEUTIC THORACENTESIS MEDICATIONS: 10 mL 2% lidocaine COMPLICATIONS: None immediate. PROCEDURE: An ultrasound guided thoracentesis was thoroughly discussed with the patient and questions answered. The benefits, risks, alternatives and complications were also discussed. The patient understands and wishes to proceed with the procedure. Written consent was obtained. Ultrasound was performed to localize and mark an adequate pocket of fluid in the left chest. The area was then prepped and draped in the normal sterile fashion. 1% Lidocaine was used for local anesthesia. Under ultrasound guidance a 6 Fr Safe-T-Centesis catheter was introduced. Thoracentesis was performed. The catheter was removed and a dressing applied. FINDINGS: A total of approximately 1.55 L of hazy amber fluid was removed. Samples were sent to the laboratory as requested by the clinical team. IMPRESSION: Successful ultrasound guided left thoracentesis yielding 1.55 L of pleural fluid. Read by: Earley Abide, PA-C Electronically Signed   By: Markus Daft M.D.   On: 05/04/2020 15:12   IR PERC PLEURAL DRAIN W/INDWELL CATH W/IMG GUIDE  Result Date: 05/10/2020 CLINICAL DATA:  Recurrent malignant left pleural effusion and need for tunneled pleural drainage catheter prior to discharge from the hospital. EXAM: INSERTION OF TUNNELED PLEURAL DRAINAGE CATHETER ANESTHESIA/SEDATION: 1.0 mg IV Versed; 50 mcg IV Fentanyl. Total Moderate Sedation Time: 27 minutes. The patient's level of consciousness and physiologic status were  continuously monitored during the procedure by Radiology nursing. MEDICATIONS: 2 g IV Ancef. Antibiotic was administered in an appropriate time interval for the procedure. FLUOROSCOPY TIME:  6 seconds.  1.0 mGy. PROCEDURE: The procedure, risks, benefits, and alternatives were explained to the patient. Questions regarding the procedure were encouraged and answered. The patient understands and consents to the procedure. A time-out was performed prior to initiating the procedure. The left  chest wall was prepped with chlorhexidine in a sterile fashion, and a sterile drape was applied covering the operative field. A sterile gown and sterile gloves were used for the procedure. Local anesthesia was provided with 1% Lidocaine. Ultrasound image documentation was performed. Fluoroscopy during the procedure and fluoroscopic spot radiograph confirms appropriate catheter position. After creating a small skin incision, a 19 gauge needle was advanced into the pleural cavity under ultrasound guidance. A guide wire was then advanced under fluoroscopy into the pleural space. Pleural access was dilated serially and a 16-French peel-away sheath placed. A 15.5 French tunneled PleurX catheter was placed. This was tunneled from an incision 5 cm below the pleural access to the access site. The catheter was advanced through the peel-away sheath. The sheath was then removed. Final catheter positioning was confirmed with a fluoroscopic spot image. The access incision was closed with subcutaneous and subcuticular 4-0 Vicryl. Dermabond was applied to the incision. A Prolene retention suture was applied at the catheter exit site. Thoracentesis was then performed via a vacuum bottle. COMPLICATIONS: None. FINDINGS: The catheter was placed via the left lower lateral chest wall. Approximately 1.1 liters of pleural fluid was able to be removed after catheter placement. IMPRESSION: Placement of permanent, tunneled left pleural drainage catheter via  lateral approach. 1.1 liters of pleural fluid was removed today after catheter placement. Electronically Signed   By: Aletta Edouard M.D.   On: 05/10/2020 14:15     Thoracentesis 05/01/20 removed 1500 mL                         Thoracentesis 05/04/20 removed 1.55 Liters of Hazy Amber Fluid                           Tunneled Plure-X Catheter Placement on 05/10/20 placed via right midaxillary approach that extends towards apex. 1.1 L of pleural fluid removed after tube placement.  ECHOCARDIOGRAM IMPRESSIONS    1. Left ventricular ejection fraction, by estimation, is 60 to 65%. The  left ventricle has normal function. The left ventricle has no regional  wall motion abnormalities. Left ventricular diastolic parameters are  consistent with Grade I diastolic  dysfunction (impaired relaxation). Elevated left ventricular end-diastolic  pressure.  2. Right ventricular systolic function is normal. The right ventricular  size is normal.  3. The mitral valve is degenerative. No evidence of mitral valve  regurgitation. No evidence of mitral stenosis. Moderate mitral annular  calcification.  4. The aortic valve is normal in structure. Aortic valve regurgitation is  not visualized. No aortic stenosis is present.  5. The inferior vena cava is normal in size with greater than 50%  respiratory variability, suggesting right atrial pressure of 3 mmHg.   FINDINGS  Left Ventricle: Left ventricular ejection fraction, by estimation, is 60  to 65%. The left ventricle has normal function. The left ventricle has no  regional wall motion abnormalities. The left ventricular internal cavity  size was normal in size. There is  no left ventricular hypertrophy. Left ventricular diastolic parameters  are consistent with Grade I diastolic dysfunction (impaired relaxation).  Elevated left ventricular end-diastolic pressure.   Right Ventricle: The right ventricular size is normal. No increase in  right  ventricular wall thickness. Right ventricular systolic function is  normal.   Left Atrium: Left atrial size was normal in size.   Right Atrium: Right atrial size was normal in size.   Pericardium: There  is no evidence of pericardial effusion.   Mitral Valve: The mitral valve is degenerative in appearance. Moderate  mitral annular calcification. No evidence of mitral valve regurgitation.  No evidence of mitral valve stenosis.   Tricuspid Valve: The tricuspid valve is normal in structure. Tricuspid  valve regurgitation is not demonstrated. No evidence of tricuspid  stenosis.   Aortic Valve: The aortic valve is normal in structure. Aortic valve  regurgitation is not visualized. No aortic stenosis is present.   Pulmonic Valve: The pulmonic valve was normal in structure. Pulmonic valve  regurgitation is not visualized. No evidence of pulmonic stenosis.   Aorta: The aortic root is normal in size and structure.   Venous: The inferior vena cava is normal in size with greater than 50%  respiratory variability, suggesting right atrial pressure of 3 mmHg.   IAS/Shunts: No atrial level shunt detected by color flow Doppler.     LEFT VENTRICLE  PLAX 2D  LVIDd:     3.30 cm Diastology  LVIDs:     1.60 cm LV e' medial:  4.03 cm/s  LV PW:     1.00 cm LV E/e' medial: 19.9  LV IVS:    1.00 cm LV e' lateral:  5.00 cm/s             LV E/e' lateral: 16.0     RIGHT VENTRICLE  RV Basal diam: 2.20 cm  RV S prime:   2.50 cm/s  TAPSE (M-mode): 2.0 cm   LEFT ATRIUM       Index    RIGHT ATRIUM     Index  LA Vol (A2C):  26.5 ml 15.64 ml/m RA Area:   8.27 cm  LA Vol (A4C):  23.7 ml 13.99 ml/m RA Volume:  15.00 ml 8.85 ml/m  LA Biplane Vol: 26.8 ml 15.82 ml/m  AORTIC VALVE  LVOT Vmax:  120.00 cm/s  LVOT Vmean: 84.000 cm/s  LVOT VTI:  0.220 m    AORTA  Ao Root diam: 2.60 cm   MITRAL VALVE  MV Area (PHT): 3.60 cm   SHUNTS   MV Decel Time: 211 msec   Systemic VTI: 0.22 m  MV E velocity: 80.20 cm/s  MV A velocity: 127.00 cm/s  MV E/A ratio: 0.63   Subjective: Seen and examined at bedside and states that she still did not sleep very well but feels better today than she did yesterday and was not in much pain on the left side of her ribs. Denies any chest pain, lightheadedness or dizziness. No other concerns complaints this time and ready to be going to a skilled nursing facility.  Discharge Exam: Vitals:   05/10/20 2100 05/11/20 0500  BP: 131/78 117/80  Pulse: 81 80  Resp: 19 17  Temp: 98.3 F (36.8 C) 98 F (36.7 C)  SpO2: 93% 96%   Vitals:   05/10/20 1210 05/10/20 1802 05/10/20 2100 05/11/20 0500  BP: (!) 146/81 135/74 131/78 117/80  Pulse: 92  81 80  Resp: (!) 24 18 19 17   Temp:  98.5 F (36.9 C) 98.3 F (36.8 C) 98 F (36.7 C)  TempSrc:  Oral Oral Oral  SpO2: 100% 97% 93% 96%  Weight:      Height:       General: Pt is alert, awake, not in acute distress Cardiovascular: RRR, S1/S2 +, no rubs, no gallops Respiratory: Diminished bilaterally with coarse breath sounds worse in the left compared to right and does have some slight rhonchi. Wearing supplemental oxygen via  nasal cannula but has no appreciable wheezing. Wearing 2 L of supplemental oxygen via nasal cannula Abdominal: Soft, NT, ND, bowel sounds + Extremities: Has 1+ nonpitting edema, no cyanosis  The results of significant diagnostics from this hospitalization (including imaging, microbiology, ancillary and laboratory) are listed below for reference.    Microbiology: Recent Results (from the past 240 hour(s))  Resp Panel by RT-PCR (Flu A&B, Covid) Nasopharyngeal Swab     Status: None   Collection Time: 05/01/20  6:22 PM   Specimen: Nasopharyngeal Swab; Nasopharyngeal(NP) swabs in vial transport medium  Result Value Ref Range Status   SARS Coronavirus 2 by RT PCR NEGATIVE NEGATIVE Final    Comment: (NOTE) SARS-CoV-2 target  nucleic acids are NOT DETECTED.  The SARS-CoV-2 RNA is generally detectable in upper respiratory specimens during the acute phase of infection. The lowest concentration of SARS-CoV-2 viral copies this assay can detect is 138 copies/mL. A negative result does not preclude SARS-Cov-2 infection and should not be used as the sole basis for treatment or other patient management decisions. A negative result may occur with  improper specimen collection/handling, submission of specimen other than nasopharyngeal swab, presence of viral mutation(s) within the areas targeted by this assay, and inadequate number of viral copies(<138 copies/mL). A negative result must be combined with clinical observations, patient history, and epidemiological information. The expected result is Negative.  Fact Sheet for Patients:  EntrepreneurPulse.com.au  Fact Sheet for Healthcare Providers:  IncredibleEmployment.be  This test is no t yet approved or cleared by the [place] FDA and  has been authorized for detection and/or diagnosis of SARS-CoV-2 by FDA under an Emergency Use Authorization (EUA). This EUA will remain  in effect (meaning this test can be used) for the duration of the COVID-19 declaration under Section 564(b)(1) of the Act, 21 U.S.C.section 360bbb-3(b)(1), unless the authorization is terminated  or revoked sooner.       Influenza A by PCR NEGATIVE NEGATIVE Final   Influenza B by PCR NEGATIVE NEGATIVE Final    Comment: (NOTE) The Xpert Xpress SARS-CoV-2/FLU/RSV plus assay is intended as an aid in the diagnosis of influenza from Nasopharyngeal swab specimens and should not be used as a sole basis for treatment. Nasal washings and aspirates are unacceptable for Xpert Xpress SARS-CoV-2/FLU/RSV testing.  Fact Sheet for Patients: EntrepreneurPulse.com.au  Fact Sheet for Healthcare  Providers: IncredibleEmployment.be  This test is not yet approved or cleared by the [place] FDA and has been authorized for detection and/or diagnosis of SARS-CoV-2 by FDA under an Emergency Use Authorization (EUA). This EUA will remain in effect (meaning this test can be used) for the duration of the COVID-19 declaration under Section 564(b)(1) of the Act, 21 U.S.C. section 360bbb-3(b)(1), unless the authorization is terminated or revoked.  Performed at [facility], [place] [address], [place], [place] [address]   Body fluid culture (includes gram stain)     Status: None   Collection Time: 05/01/20  8:38 PM   Specimen: Pleural Fluid  Result Value Ref Range Status   Specimen Description FLUID PLEURAL  Final   Special Requests NONE  Final   Gram Stain   Final    RARE WBC PRESENT, PREDOMINANTLY MONONUCLEAR NO ORGANISMS SEEN    Culture   Final    NO GROWTH Performed at [facility], [place] [address], [place], [place] [address]    Report Status 05/05/2020 FINAL  Final  Culture, blood (routine x 2)     Status: None  Collection Time: 05/01/20 11:05 PM   Specimen: BLOOD  Result Value Ref Range Status   Specimen Description BLOOD SITE NOT SPECIFIED  Final   Special Requests   Final    BOTTLES DRAWN AEROBIC AND ANAEROBIC Blood Culture adequate volume   Culture   Final    NO GROWTH 5 DAYS Performed at [facility], [address], [place], [place] [address]    Report Status 05/07/2020 FINAL  Final  Culture, blood (routine x 2)     Status: None   Collection Time: 05/02/20  2:52 PM   Specimen: BLOOD  Result Value Ref Range Status   Specimen Description BLOOD RIGHT ANTECUBITAL  Final   Special Requests   Final    BOTTLES DRAWN AEROBIC AND ANAEROBIC Blood Culture adequate volume   Culture   Final    NO GROWTH 5 DAYS Performed at [facility], [place] [address] [place], [place] [address]    Report Status 05/07/2020 FINAL  Final  Body  fluid culture     Status: None   Collection Time: 05/04/20  2:09 PM   Specimen: Lung, Left; Pleural Fluid  Result Value Ref Range Status   Specimen Description PLEURAL FLUID  Final   Special Requests LEFT LUNG  Final   Gram Stain   Final    RARE WBC PRESENT, PREDOMINANTLY MONONUCLEAR NO ORGANISMS SEEN    Culture   Final    NO GROWTH Performed at [facility], [place] [address] [place], [place] [address]    Report Status 05/07/2020 FINAL  Final  Acid Fast Smear (AFB)     Status: None   Collection Time: 05/04/20  2:09 PM   Specimen: Lung, Left; Pleural Fluid  Result Value Ref Range Status   AFB Specimen Processing Concentration  Final   Acid Fast Smear Negative  Final    Comment: (NOTE) Performed At: [facility] [place], [place] [id number] Rush Farmer MD [id number]    Source (AFB) PLEURAL  Final    Comment: FLUID LEFT LUNG Performed at [facility], [place] [address] [place],  [address]     Labs: BNP (last 3 results) Recent Labs    05/01/20 1632  BNP 89.1   Basic Metabolic Panel: Recent Labs  Lab 05/05/20 0314 05/07/20 0144 05/09/20 0319 05/10/20 0322 05/11/20 0246  NA 137 135 136 137 135  K 3.4* 3.6 4.3 4.2 3.9  CL 105 102 106 105 104  CO2 23 25 26 23 24   GLUCOSE 106* 118* 108* 112* 115*  BUN 7* 9 8 11 12   CREATININE 0.69 0.90 0.79 0.77 0.81  CALCIUM 9.2 9.7 9.6 9.8 9.6  MG 2.1  --  2.1 2.1 2.1  PHOS 3.6  --  3.1 3.0 2.7   Liver Function Tests: Recent Labs  Lab 05/05/20 0314 05/07/20 0144 05/09/20 0319 05/10/20 0322 05/11/20 0246  AST 19 20 17 24 19   ALT 24 22 20 20 17   ALKPHOS 45 48 51 54 48  BILITOT 0.7 0.8 0.5 0.6 0.8  PROT 5.0* 5.0* 5.1* 5.2* 5.2*  ALBUMIN 2.6* 2.4* 2.4* 2.5* 2.4*   No results for input(s): LIPASE, AMYLASE in the last 168 hours. No results for input(s): AMMONIA in the last 168 hours. CBC: Recent Labs  Lab 05/05/20 0314 05/07/20 0144 05/08/20 1812 05/09/20 0319  05/10/20 0322 05/11/20 0246  WBC 11.5* 13.3* 12.7* 11.6* 12.2* 11.1*  NEUTROABS 8.6*  --   --  8.5*  9.5* 8.1*  HGB 13.9 13.9 15.4* 14.0 14.2 14.6  HCT 40.7 39.2 44.2 43.3 43.1 42.3  MCV 86.8 86.2 87.2 90.8 89.0 87.9  PLT 349 276 283 248 251 237   Cardiac Enzymes: No results for input(s): CKTOTAL, CKMB, CKMBINDEX, TROPONINI in the last 168 hours. BNP: Invalid input(s): POCBNP CBG: No results for input(s): GLUCAP in the last 168 hours. D-Dimer No results for input(s): DDIMER in the last 72 hours. Hgb A1c No results for input(s): HGBA1C in the last 72 hours. Lipid Profile No results for input(s): CHOL, HDL, LDLCALC, TRIG, CHOLHDL, LDLDIRECT in the last 72 hours. Thyroid function studies No results for input(s): TSH, T4TOTAL, T3FREE, THYROIDAB in the last 72 hours.  Invalid input(s): FREET3 Anemia work up No results for input(s): VITAMINB12, FOLATE, FERRITIN, TIBC, IRON, RETICCTPCT in the last 72 hours. Urinalysis No results found for: COLORURINE, APPEARANCEUR, [place], [place], GLUCOSEU, [place], [place], [place], PROTEINUR, UROBILINOGEN, NITRITE, LEUKOCYTESUR Sepsis Labs Invalid input(s): PROCALCITONIN,  WBC,  LACTICIDVEN Microbiology Recent Results (from the past 240 hour(s))  Resp Panel by RT-PCR (Flu A&B, Covid) Nasopharyngeal Swab     Status: None   Collection Time: 05/01/20  6:22 PM   Specimen: Nasopharyngeal Swab; Nasopharyngeal(NP) swabs in vial transport medium  Result Value Ref Range Status   SARS Coronavirus 2 by RT PCR NEGATIVE NEGATIVE Final    Comment: (NOTE) SARS-CoV-2 target nucleic acids are NOT DETECTED.  The SARS-CoV-2 RNA is generally detectable in upper respiratory specimens during the acute phase of infection. The lowest concentration of SARS-CoV-2 viral copies this assay can detect is 138 copies/mL. A negative result does not preclude SARS-Cov-2 infection and should not be used as the sole basis for treatment or other patient management  decisions. A negative result may occur with  improper specimen collection/handling, submission of specimen other than nasopharyngeal swab, presence of viral mutation(s) within the areas targeted by this assay, and inadequate number of viral copies(<138 copies/mL). A negative result must be combined with clinical observations, patient history, and epidemiological information. The expected result is Negative.  Fact Sheet for Patients:  EntrepreneurPulse.com.au  Fact Sheet for Healthcare Providers:  IncredibleEmployment.be  This test is no t yet approved or cleared by the [place] FDA and  has been authorized for detection and/or diagnosis of SARS-CoV-2 by FDA under an Emergency Use Authorization (EUA). This EUA will remain  in effect (meaning this test can be used) for the duration of the COVID-19 declaration under Section 564(b)(1) of the Act, 21 U.S.C.section 360bbb-3(b)(1), unless the authorization is terminated  or revoked sooner.       Influenza A by PCR NEGATIVE NEGATIVE Final   Influenza B by PCR NEGATIVE NEGATIVE Final    Comment: (NOTE) The Xpert Xpress SARS-CoV-2/FLU/RSV plus assay is intended as an aid in the diagnosis of influenza from Nasopharyngeal swab specimens and should not be used as a sole basis for treatment. Nasal washings and aspirates are unacceptable for Xpert Xpress SARS-CoV-2/FLU/RSV testing.  Fact Sheet for Patients: EntrepreneurPulse.com.au  Fact Sheet for Healthcare Providers: IncredibleEmployment.be  This test is not yet approved or cleared by the [place] FDA and has been authorized for detection and/or diagnosis of SARS-CoV-2 by FDA under an Emergency Use Authorization (EUA). This EUA will remain in effect (meaning this test can be used) for the duration of the COVID-19 declaration under Section 564(b)(1) of the Act, 21 U.S.C. section 360bbb-3(b)(1), unless the  authorization is terminated or revoked.  Performed at [facility], [place]  [address], [place], [place] [address]   Body fluid culture (includes gram stain)     Status: None   Collection Time: 05/01/20  8:38 PM   Specimen: Pleural Fluid  Result Value Ref Range Status   Specimen Description FLUID PLEURAL  Final   Special Requests NONE  Final   Gram Stain   Final    RARE WBC PRESENT, PREDOMINANTLY MONONUCLEAR NO ORGANISMS SEEN    Culture   Final    NO GROWTH Performed at [facility], [place] [address], [place], [place] [address]    Report Status 05/05/2020 FINAL  Final  Culture, blood (routine x 2)     Status: None   Collection Time: 05/01/20 11:05 PM   Specimen: BLOOD  Result Value Ref Range Status   Specimen Description BLOOD SITE NOT SPECIFIED  Final   Special Requests   Final    BOTTLES DRAWN AEROBIC AND ANAEROBIC Blood Culture adequate volume   Culture   Final    NO GROWTH 5 DAYS Performed at [facility], [place] [address], [place], [place] [address]    Report Status 05/07/2020 FINAL  Final  Culture, blood (routine x 2)     Status: None   Collection Time: 05/02/20  2:52 PM   Specimen: BLOOD  Result Value Ref Range Status   Specimen Description BLOOD RIGHT ANTECUBITAL  Final   Special Requests   Final    BOTTLES DRAWN AEROBIC AND ANAEROBIC Blood Culture adequate volume   Culture   Final    NO GROWTH 5 DAYS Performed at [facility], [place] [address], [place], [place] [address]    Report Status 05/07/2020 FINAL  Final  Body fluid culture     Status: None   Collection Time: 05/04/20  2:09 PM   Specimen: Lung, Left; Pleural Fluid  Result Value Ref Range Status   Specimen Description PLEURAL FLUID  Final   Special Requests LEFT LUNG  Final   Gram Stain   Final    RARE WBC PRESENT, PREDOMINANTLY MONONUCLEAR NO ORGANISMS SEEN    Culture   Final    NO GROWTH Performed at [facility], [place] [address] [place], [place] [address]    Report  Status 05/07/2020 FINAL  Final  Acid Fast Smear (AFB)     Status: None   Collection Time: 05/04/20  2:09 PM   Specimen: Lung, Left; Pleural Fluid  Result Value Ref Range Status   AFB Specimen Processing Concentration  Final   Acid Fast Smear Negative  Final    Comment: (NOTE) Performed At: [facility] [place], [place] [id number] Rush Farmer MD [id number]    Source (AFB) PLEURAL  Final    Comment: FLUID LEFT LUNG Performed at [facility], [place] [address] [place], [place] [address]    Time coordinating discharge: 35 minutes  SIGNED:  Kerney Elbe, DO [facility] 05/11/2020, 9:37 AM Pager is on [place]  If 7PM-7AM, please contact night-coverage [email or web address]

## 2020-05-11 NOTE — Telephone Encounter (Signed)
Pt has been scheduled to see [name] on 1/5 at 130pm w/labs at 1pm. She is currently in the hospital. Appt date and time will appear on her discharge summary.

## 2020-05-11 NOTE — TOC Progression Note (Signed)
Transition of Care [facility]) - Progression Note    Patient Details  Name: Shelley Flores MRN: [id number] Date of Birth: [birth year]  Transition of Care [facility]) CM/SW Contact  Angelita Ingles, RN Phone Number: [phone]  05/11/2020, 3:38 PM  Clinical Narrative:    CM received call from Amber at [facility] has received drainage bottles for pleurx catheter and is now able to accept patient. Patients covid test has not been collected. Bedside nurse has been made aware and will initiate collection. [facility] would need covid results prior to 4:30pm for discharge today. Results will not be avaliable  that soon so discharge will happen in the am.        Expected Discharge Plan and Services           Expected Discharge Date: 05/11/20                                     Social Determinants of Health (SDOH) Interventions    Readmission Risk Interventions No flowsheet data found.

## 2020-05-11 NOTE — Plan of Care (Signed)
  Problem: Activity: Goal: Risk for activity intolerance will decrease Outcome: Completed/Met   Problem: Nutrition: Goal: Adequate nutrition will be maintained Outcome: Completed/Met   Problem: Elimination: Goal: Will not experience complications related to bowel motility Outcome: Completed/Met

## 2020-05-11 NOTE — Progress Notes (Signed)
IR.  History of recurrent symptomatic left pleural effusion s/p tunneled left chest pleurX catheter placement in IR 05/10/2020.  Left chest pleurX site soft with minimal output of dried blood on dressing, no active bleeding, drainage, or hematoma noted.  Left chest pleurX site stable, tube ready for use PRN dyspnea.  Discharge instructions for pleurX use: 1- Drain PRN (when patient feels short of breath). 2- Change dressing when pleurX is used (or more if dressing saturated). 3- No bathing/swimming/showering with pleurX in place (ok to sponge bath around drain site).  Please call IR with questions/concerns.   Bea Graff Shean Gerding, PA-C 05/11/2020, 10:29 AM

## 2020-05-12 DIAGNOSIS — I493 Ventricular premature depolarization: Secondary | ICD-10-CM | POA: Diagnosis not present

## 2020-05-12 DIAGNOSIS — R1319 Other dysphagia: Secondary | ICD-10-CM | POA: Diagnosis not present

## 2020-05-12 DIAGNOSIS — J910 Malignant pleural effusion: Secondary | ICD-10-CM | POA: Diagnosis not present

## 2020-05-12 DIAGNOSIS — K219 Gastro-esophageal reflux disease without esophagitis: Secondary | ICD-10-CM | POA: Diagnosis not present

## 2020-05-12 DIAGNOSIS — C3412 Malignant neoplasm of upper lobe, left bronchus or lung: Secondary | ICD-10-CM | POA: Diagnosis present

## 2020-05-12 DIAGNOSIS — R011 Cardiac murmur, unspecified: Secondary | ICD-10-CM | POA: Diagnosis not present

## 2020-05-12 DIAGNOSIS — C7931 Secondary malignant neoplasm of brain: Secondary | ICD-10-CM | POA: Diagnosis not present

## 2020-05-12 DIAGNOSIS — Z87891 Personal history of nicotine dependence: Secondary | ICD-10-CM | POA: Diagnosis not present

## 2020-05-12 DIAGNOSIS — E785 Hyperlipidemia, unspecified: Secondary | ICD-10-CM | POA: Diagnosis not present

## 2020-05-12 DIAGNOSIS — R5381 Other malaise: Secondary | ICD-10-CM | POA: Diagnosis not present

## 2020-05-12 DIAGNOSIS — R600 Localized edema: Secondary | ICD-10-CM | POA: Diagnosis not present

## 2020-05-12 DIAGNOSIS — I10 Essential (primary) hypertension: Secondary | ICD-10-CM | POA: Diagnosis not present

## 2020-05-12 DIAGNOSIS — M6281 Muscle weakness (generalized): Secondary | ICD-10-CM | POA: Diagnosis not present

## 2020-05-12 DIAGNOSIS — J9610 Chronic respiratory failure, unspecified whether with hypoxia or hypercapnia: Secondary | ICD-10-CM | POA: Diagnosis not present

## 2020-05-12 DIAGNOSIS — Z9689 Presence of other specified functional implants: Secondary | ICD-10-CM | POA: Diagnosis not present

## 2020-05-12 DIAGNOSIS — E871 Hypo-osmolality and hyponatremia: Secondary | ICD-10-CM | POA: Diagnosis not present

## 2020-05-12 DIAGNOSIS — J90 Pleural effusion, not elsewhere classified: Secondary | ICD-10-CM | POA: Diagnosis not present

## 2020-05-12 DIAGNOSIS — M810 Age-related osteoporosis without current pathological fracture: Secondary | ICD-10-CM | POA: Diagnosis not present

## 2020-05-12 DIAGNOSIS — R918 Other nonspecific abnormal finding of lung field: Secondary | ICD-10-CM | POA: Diagnosis not present

## 2020-05-12 DIAGNOSIS — R609 Edema, unspecified: Secondary | ICD-10-CM | POA: Diagnosis not present

## 2020-05-12 DIAGNOSIS — R6881 Early satiety: Secondary | ICD-10-CM | POA: Diagnosis not present

## 2020-05-12 DIAGNOSIS — C3492 Malignant neoplasm of unspecified part of left bronchus or lung: Secondary | ICD-10-CM | POA: Diagnosis not present

## 2020-05-12 DIAGNOSIS — R058 Other specified cough: Secondary | ICD-10-CM | POA: Diagnosis not present

## 2020-05-12 DIAGNOSIS — R590 Localized enlarged lymph nodes: Secondary | ICD-10-CM | POA: Diagnosis not present

## 2020-05-12 DIAGNOSIS — R2681 Unsteadiness on feet: Secondary | ICD-10-CM | POA: Diagnosis not present

## 2020-05-12 DIAGNOSIS — R279 Unspecified lack of coordination: Secondary | ICD-10-CM | POA: Diagnosis not present

## 2020-05-12 DIAGNOSIS — Z743 Need for continuous supervision: Secondary | ICD-10-CM | POA: Diagnosis not present

## 2020-05-12 DIAGNOSIS — E876 Hypokalemia: Secondary | ICD-10-CM | POA: Diagnosis not present

## 2020-05-12 NOTE — Discharge Summary (Signed)
Physician Discharge Summary  Shelley Flores:[id number] DOB: [birth year] DOA: 05/01/2020  PCP: Cari Caraway, MD  Admit date: 05/01/2020 Discharge date: 05/12/2020  Admitted From: ALF Disposition: SNF  Recommendations for Outpatient Follow-up:  1. Follow up with PCP in 1-2 weeks 2. Follow up with Pulmonary Dr. Valeta Harms on 05/22/20 at 2:00 pm 3. Follow up with Medical Oncology Dr. Julien Nordmann on 05/17/20 4. Please obtain CMP/CBC, Mag, Phos in one week 5. Repeat CXR within 1 week PER IR RECCS: for Drain Care-you do not drain pleurX regularly, you drain as needed. site care is to change dressings when you use it (not every day)Discharge instructions for pleurX use: 1- Drain PRN (when patient feels short of breath). 2- Change dressing when pleurX is used (or more if dressing saturated). 3- No bathing/swimming/showering with pleurX in place (ok to sponge bath around drain site). Arrange for home health nurse to drain pleurex catheter. Consult with RN to determine specific post-discharge drainage schedule. Drain daily, up to max of 1L until patient is only able to drain out 16ml. If < 126ml for 3 consecutive drains every other day, call Interventional Radiology [phone]) for evaluation and possible removal  6. Please follow up on the following pending results:  Home Health: No  Equipment/Devices: None    Discharge Condition: Stable CODE STATUS: FULL CODE  Diet recommendation:   Brief/Interim Summary: HPI per Dr. [name] on 05/01/20  GUR:KYHCWC [name] a 86 y.o.femalewith medical history significant ofhypertension, hyperlipidemia, mild valvular heart disease presenting with complaints of shortness of breath.Patient states she has been coughing since the end of October and was seen by her doctor who thought the cough was related to her having acid reflux so she was started on Prilosec but her cough is persisted. For the past 10 days she has had dyspnea on exertion  and fatigue. She had an x-ray done on 12/17 which revealed a large left pleural effusion. She was supposed to get IR thoracentesis but it has not been scheduled yet. Since her dyspnea was worsening she came into the ED today to be evaluated. Denies fevers and states she has been fully vaccinated against Covid. Denies chest pain. Denies prior history of pleural effusion/thoracentesis. Denies history of malignancy. She smoked cigarettes for about 4 years back in her 36s when she was in graduate school but not since then. Denies unintentional weight loss. Reports having abdominal distention for several months. Also complaining of early satiety/loss of appetite. No other complaints.  ED Course:Afebrile. Not tachycardic. Tachypneic with respiratory rate in the 20s. Blood pressure elevated with systolic up to 376E. WBC 13.4, hemoglobin 15.8, hematocrit 45.4, platelet 473K.Sodium 131, potassium 2.9, chloride 93, bicarb 25, BUN 15, creatinine 0.7, glucose 125. Magnesium 2.2. BNP 70. SARS-CoV-2 PCR test and influenza panel both negative. Blood culture x2 pending. Chest x-ray showing a large left pleural effusion with subtotal atelectasis of the left lung and mediastinal shift left to right, little change since 04/28/2020. Underwent thoracentesis for large left pleural effusion in the ED and 1500 cc fluid removed. Repeat chest x-ray showing interval decrease in size of a now small to moderate volume left pleural effusion; underlying infection, inflammation, pulmonary mass not excluded; trace right pleural effusion. Not hypoxic at rest but sats dropped to 90% with ambulation. She was given ceftriaxone and azithromycin. Also given oral potassium 40 mEq for hypokalemia. Admission requested for observation.  **Interim History  Patient underwent a left-sided thoracentesis x2 and drained 1.5 L of suggestive of an exudative effusion  each time.  She was recently seen in October by her doctor for  dyspnea and found to have a pleural effusion on admission. She was started on IV ceftriaxone and azithromycin and pulmonary is consulted for further evaluation recommendations given her very large pleural effusion. Cytology shows Malignant cells and patient has Adenocarcinoma. Currently awaiting Plure-X Catheter placement and after will discharge to SNF as PT recommends SNF now and she will follow up with Pulmonary on 05/22/82 and Medical Oncology on 05/18/19. LE Venous Duplex negative for DVT. Repeat CT Scan showed "Left upper lobe masslike consolidation consistent with malignancy. A portion of the mass invades the mediastinum at the level of the aortic or pulmonary window. PET-CT may be useful to delineate the mass from the postobstructive change. Prominence of the left upper lobe pulmonary interstitium, concerning for lymphangitic spread of disease. A subcarinal lymphadenopathy worrisome for nodal metastasis. Again, PET-CT may be useful. Small bilateral pleural effusions, partially loculated on the left. Decreased left effusion after recent thoracentesis.Aortic Atherosclerosis."  She is getting close to being discharged and she will have a Pleurx catheter placed yesterday.  Supplies will be ordered for facility and once this is placed she will have some drainage. She felt better today after her catheter site was placed and is not in much pain. Repeat chest x-ray shows improvement in her lungs. She'll need to follow-up with PCP, interventional radiology, medical oncology as well as pulmonary in outpatient setting and she is stable for discharge at this time.  ADDENDUM 05/12/20: The patient was deemed medically stable to be discharged yesterday however unfortunately did not leave because her Covid test did not result in time and the facility required her to have a repeat Covid test.  Her Covid testing is negative and she is remains medically stable to be discharged and will need to have her Pleurx catheter  drained intermittently and she will follow up with medical oncology, pulmonology as well as interventional radiology in the outpatient setting.  No acute events overnight noted and we will proceed with the discharge.  Discharge Diagnoses:  Principal Problem:   Pleural effusion Active Problems:   Essential hypertension   Acute hypoxemic respiratory failure (HCC)   Hyponatremia   Hypokalemia  Acute Respiratory Failure with Hypoxia secondary to a Large Left Pleural Effusion  -Patient presenting with complaints of dyspnea.  -She had a chest x-ray done on 12/17 which revealed a large left pleural effusion.  -She was supposed to get IR thoracentesis but it was not scheduled as an outpatient.  -Chest x-ray done on admission and was showingalarge left pleural effusion with subtotal atelectasis of the left lung and mediastinal shift left to right, little change since 04/28/2020.  -Underwent thoracentesis for large left pleural effusion in the ED and 1500 cc fluid removed.  -Repeat chest x-ray 05/02/20 showing interval decrease in size of a now small to moderate volume left pleural effusion; underlying infection, inflammation, pulmonary mass not excluded; trace right pleural effusion.  -Echo done in November 2019 showing normal LVEF of 51-76%, grade 1 diastolic dysfunction, andno significant valvular disease. BNP normal.  -Based on pleural fluid analysis labs, pleural fluid/serum protein ratio0.60 and pleural fluid/serum LDH ratio of 0.87 consistent with an exudative effusion. -?Parapneumoniceffusion versus possible underlying malignancy.  -Pleural fluid glucose 101 and WBC count normal. SARS-CoV-2 PCR test negative.  -On admission she had a Mild leukocytosis on labs but no fever, tachycardia, or hypotension to suggest sepsis. Not hypoxic at rest but sats dropped to 90% with  ambulation in the ED. 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 Tresa Moore in Pathology), and pleural fluid culture showed NGTD at 5Days. -Abx now stopped  -CT Scan ordered for further evaluation and showed "Moderate left and small right pleural effusions. The left pleural effusion demonstrates areas of conspicuous pleural thickening and small pleural nodularity. While this could reflect empyema, a malignant effusion with pleural deposits would present similarly. Furthermore, there is heterogeneous enhancement of the adjacent atelectatic lung parenchyma including within the lingula, anterior segment left upper lobe and anterior basal segment of the left lower lobe. Could reflect underlying airspace disease or malignancy with additional airspace disease seen throughout aerated portions of the left lung. Asymmetric soft tissue edema across the left chest wall. No clear extension of the pleural or airspace processes in to the chest wall proper. Possibly reactive though should with exam findings and continued attention on follow-up imaging is recommended. Some questionable thickening of the rectosigmoid though may be related to underdistention though some mild perirectal fat stranding is present however and could suggest an underlying proctitis. Aortic Atherosclerosis."  -Blood culture x2 Showed NGTD 5 Days. Continue to monitor WBC count as it is fluctuating and went from 13.4 -> 14.7 -> 11.5 -> 13.9 -> 11.5 -> 13.3 -> 12.7 -> 11.6 -> 12.2 -Check procalcitonin level and was <0.10. Abx now stopped -SpO2: 95 % O2 Flow Rate (L/min): 4 L/min; Now on and off of Supplemental O2 and will require it at D/C  -Continue supplemental oxygen via nasal cannula and wean O2 as tolerated -Continuous pulse Oximetry and Maintain oxygen saturation above 92%.  -ECHO done as below and showing EF of 60-65% and G1DD -Will consult Pulmonary for further evaluation and Recommendations and they are recommending a thoracentesis today.  Thoracentesis was  done and she had 1.55 Liters removed again  -Repeat CXR today showed "Left drainage catheter present medially with interval drainage of most of the pleural effusion compared to 1 day prior. Small amount of residual effusion remains with atelectatic change in the left mid lower lung regions. Apparent consolidation abutting the left hilum is present. On the right, there is a small pleural effusion with slight right base atelectasis. Heart is upper normal in size with pulmonary vascularity normal. No adenopathy no bone lesions." -Pulmonary to weigh in further and have recommended repeating a CT Scan of the Chest with Contrast and recommended calling [facility] to evaluate for VATS Pleurodesis vs Pleur-X Catheter Placement ; Will obtain LE Venous Duplex and this was Negative for DVT  -I spoke with Dr. Kipp Brood of [facility] and he recommends IR to place a Pleur-X Catheter; Dr. Rodena Piety spoke with IR and apparently IR will not place the Pleurx catheter until she is close to being discharged and has a disposition in place in terms of going to SNF.  Recommendation is for SNF and will have IR anticipate placing Pleurx cath the next few days; Timing of Pluer-X to be done by IR -I spoke with Dr. Kathlene Cote and Pleurx catheter was placed on 05/10/2020 -Repeat CT scan of the chest showed "Left upper lobe masslike consolidation consistent with malignancy. A portion of the mass invades the mediastinum at the level of the aortic or pulmonary window. PET-CT may be useful to delineate the mass from the postobstructive change. 2. Prominence of the left upper lobe pulmonary interstitium, concerning for lymphangitic spread of disease. 3. A subcarinal lymphadenopathy worrisome for nodal  metastasis. Again, PET-CT may be useful. 4. Small bilateral pleural effusions, partially loculated on the left. Decreased left effusion after recent thoracentesis. 5. Aortic Atherosclerosis." -Because of this she also underwent a CT of the abdomen and pelvis  with contrast to evaluate for Metastatsis which showed "No definite evidence of malignancy or metastatic disease in the abdomen or pelvis. Examination is generally unchanged compared to recent CT dated 05/02/2020.  No evidence of urologic malignancy per ordering indication. Definitively benign bilateral renal cysts for which no further characterization or follow-up is specifically required. Multiple subcentimeter low-attenuation lesions of the liver, generally too small to confidently characterize, although the largest are clearly simple cysts, remaining smaller lesions almost certainly incidental small cysts or hemangiomata. No findings suspicious for hepatic metastatic disease. There is redemonstrated mild rectal wall thickening and minimal perirectal fat stranding, again suggesting nonspecific proctitis. Correlate with referable signs and symptoms if present. Synchronous rectal malignancy would be difficult to strictly exclude. Consider digital and/or endoscopic examination. There is an unchanged 1.1 cm fluid low-attenuation lesion of the pancreatic tail. 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 There is no pancreatic ductal dilatation. Given new diagnosis of advanced stage lung malignancy, recommend attention on follow-up with future clinically indicated oncology imaging.  Small, left greater than right bilateral pleural effusions, similar to prior examination. Nodular thickening of the dependent left-sided parietal pleura. Findings are consistent with pleural metastatic disease as diagnosed by thoracentesis" -I also spoke with Dr. Benay Spice of Oncology who recommends outpatient follow up and patient will need Molecular Testing and additional workup including PET Scan; outpatient appointment with Dr. Julien Nordmann as already been scheduled for May 17, 2020 as well as with pulmonary Dr. Valeta Harms on May 22, 2020 -He is medically stable to be discharged now that she has a Pleurx catheter and will need to follow-up  and repeat her chest x-ray within 1 to 2 weeks and continue supplemental oxygen wean as tolerated  Abdominal Distention and Early Satiety, in the setting of her malignancy as above -Patient reports history of abdominal distention for several months and also complaining of early satiety/loss of appetite.  -No complains of abdominal pain, nausea, vomiting. Abdomen does appear significantly distended on exam but nontender. ?Abdominal mass/ ascites. -Repeat CT Scan as above  -C/w PPI Pantoprazole 40 mg po twice daily and continue with home PPI with omeprazole -Evaluated for Mass -Follow-up with medical oncology in outpatient setting and have PET scan done  Bilateral Lower Extremity Edema -BNP normal.She was previously on amlodipine which was stopped during cardiology visit last month. -Has 1+ mildly pitting edema of bilateral lower extremities but mostly non-pitting. -Needs some more IV Lasix -ECHOCardiogram showed Grade 1 DD and normal EF of 60-65% -Could be in the setting of poor Nutritional Status and Hypoalbuminemia  -Will obtain LE Venous Duplex to r/o DVT and was Negative for DVT  Hyponatremia -Mild -Sodium 131 on admission and is now improved to 137 yesterday and today is 135 -Possibly due to hypervolemia or could be due to home thiazide diuretic use. -We'll need to watch carefully as we are resuming her home hydrochlorothiazide at discharge -Repeat CMP within 1 week  Hypokalemia -Potassium 2.9 on admission and improved to 3.9 -Likely related to home diuretic use and will need to be careful in resuming her home hydrochlorothiazide and evaluate closely -Continue to Monitor and Replete as Necessary -Repeat CMP in the AM   Hypertension -Stable. -Amlodipine was discontinued during recent cardiology visit due to patient having worsening lower extremity  edema and will continue to Hold -Continue Metoprolol Succinate 50 mg po daily -Continue to Monitor BP per Protocol -Last  BP reading was 144/69  Hyperlipidemia -C/w Atorvastatin 20 mg po daily   PVCs -C/w telemetry Monitoring -C/w Metoprolol Succinate 50 mg po Daily  Hypophosphatemia -Patient's Phos Level was 3.0  -Continue to Monitor and Replete as Necessary -Repeat Phos Level in the AM    Left Arm IV Infiltration r/o Phlebitis  -Warm Compresses -Area was erythematous and slightly boggy -If persists will get an U/S of the ARM  -Elevation of the Extremity   GERD -Continue PPI with Prilosec Substitution of po Pantoprazole 40 mg po BID daily   Discharge Instructions  Discharge Instructions    Ambulatory Pleural Drainage Schedule   Complete by: As directed    Drain daily, up to max of 1L until patient is only able to drain out 116ml. If <150ml for 3 consecutive drains every other day, then call Interventional Radiology [phone]) for evaluation and possible removal.   Call MD for:  difficulty breathing, headache or visual disturbances   Complete by: As directed    Call MD for:  extreme fatigue   Complete by: As directed    Call MD for:  hives   Complete by: As directed    Call MD for:  persistant dizziness or light-headedness   Complete by: As directed    Call MD for:  persistant nausea and vomiting   Complete by: As directed    Call MD for:  redness, tenderness, or signs of infection (pain, swelling, redness, odor or green/yellow discharge around incision site)   Complete by: As directed    Call MD for:  severe uncontrolled pain   Complete by: As directed    Call MD for:  temperature >100.4   Complete by: As directed    Diet - low sodium heart healthy   Complete by: As directed    Discharge instructions   Complete by: As directed    You were cared for by a hospitalist during your hospital stay. If you have any questions about your discharge medications or the care you received while you were in the hospital after you are discharged, you can call the unit and ask to speak with the  hospitalist on call if the hospitalist that took care of you is not available. Once you are discharged, your primary care physician will handle any further medical issues. Please note that NO REFILLS for any discharge medications will be authorized once you are discharged, as it is imperative that you return to your primary care physician (or establish a relationship with a primary care physician if you do not have one) for your aftercare needs so that they can reassess your need for medications and monitor your lab values.  Follow up with PCP, Medical Oncology, Interventional Radiology, and Pulmonary in the outpatient setting. Repeat CXR within 1 week. Take all medications as prescribed. If symptoms change or worsen please return to the ED for evaluation   Discharge wound care:   Complete by: As directed    Drain Care per IR Recommendations   Increase activity slowly   Complete by: As directed      Allergies as of 05/12/2020      Reactions   Irbesartan Nausea Only      Medication List    TAKE these medications   acetaminophen 325 MG tablet Commonly known as: TYLENOL Take 2 tablets (650 mg total) by mouth every 6 (six)  hours as needed for mild pain (or Fever >/= 101).   alendronate 70 MG tablet Commonly known as: FOSAMAX Take 70 mg by mouth every Saturday.   amLODipine 5 MG tablet Commonly known as: NORVASC Take 5 mg by mouth daily.   aspirin 81 MG tablet Take 81 mg by mouth daily.   atorvastatin 20 MG tablet Commonly known as: LIPITOR TAKE 1 TABLET BY MOUTH EVERY DAY What changed: when to take this   BENZONATATE PO Take 1 capsule by mouth 3 (three) times daily.   Glucosamine HCl 1000 MG Tabs Take 1,000 mg by mouth daily.   guaiFENesin-dextromethorphan 100-10 MG/5ML syrup Commonly known as: ROBITUSSIN DM Take 5 mLs by mouth every 4 (four) hours as needed for cough.   hydrochlorothiazide 12.5 MG capsule Commonly known as: MICROZIDE TAKE 1 CAPSULE BY MOUTH EVERY  DAY What changed: how much to take   metoprolol succinate 50 MG 24 hr tablet Commonly known as: TOPROL-XL TAKE 1 TABLET (50 MG TOTAL) BY MOUTH DAILY. TAKE WITH OR IMMEDIATELY FOLLOWING A MEAL.   multivitamin tablet Take 1 tablet by mouth daily.   PriLOSEC OTC 20 MG tablet Generic drug: omeprazole Take 20 mg by mouth 2 (two) times daily before a meal.   Viactiv Calcium Plus D 650-12.5-40 MG-MCG-MCG Chew Generic drug: Calcium-Vitamin D-Vitamin K Chew 1 tablet by mouth daily.            Durable Medical Equipment  (From admission, onward)         Start     Ordered   05/11/20 0934  For home use only DME oxygen  Once       Question Answer Comment  Length of Need 6 Months   Mode or (Route) Nasal cannula   Liters per Minute 2   Oxygen delivery system Gas      05/11/20 0933           Discharge Care Instructions  (From admission, onward)         Start     Ordered   05/11/20 0000  Discharge wound care:       Comments: Drain Care per IR Recommendations   05/11/20 0930          Follow-up Information    Cari Caraway, MD Follow up.   Specialty: Family Medicine Contact information: [place] [address] [phone]        June Leap L, DO Follow up.   Specialty: Pulmonary Disease Why: Appointment Scheduled for 05/22/20 at 2:00 pm Contact information: [place] [address] [phone]        Curt Bears, MD Follow up.   Specialty: Oncology Why: Appointment scheduled for 05/17/20 with Dr. Julien Nordmann and [facility] information: [address] [place] [address] [phone]              Allergies  Allergen Reactions  . Irbesartan Nausea Only    Consultations:  Pulmonary  Interventional Radiology  Discussed with Medical Oncology Dr. Julieanne Manson  Procedures/Studies: DG Chest 1 View  Result Date: 05/06/2020 CLINICAL DATA:  Shortness of breath. EXAM: CHEST  1 VIEW  COMPARISON:  05/05/2020 FINDINGS: 0926 hours. Interval progression of left parahilar and retrocardiac basilar collapse/consolidative opacity. Left pleural effusion noted. There is probable some minimal atelectasis at the right base with tiny right pleural effusion. IMPRESSION: 1. Interval progression of left parahilar and retrocardiac basilar collapse/consolidative opacity. 2. Left pleural effusion. 3. Similar appearance of minimal atelectasis/infiltrate  at the right base with tiny right effusion. Electronically Signed   By: Misty Stanley M.D.   On: 05/06/2020 09:33   DG Chest 1 View  Result Date: 05/04/2020 CLINICAL DATA:  Post thoracentesis on the left. EXAM: CHEST  1 VIEW COMPARISON:  Radiographs earlier today and 05/03/2020. CT 05/02/2020. FINDINGS: 1416 hours. Interval decreased size of left pleural effusion following thoracentesis. There is improved aeration of the left lung with persistent left perihilar opacity which may reflect a mass or infiltrate. No pneumothorax. The right lung remains clear. There is a stable small right pleural effusion. The heart size and mediastinal contours are stable. IMPRESSION: 1. Interval decreased size of left pleural effusion following thoracentesis with improved aeration of the left lung. No pneumothorax. 2. Persistent left perihilar opacity may reflect a mass or infiltrate. Electronically Signed   By: Richardean Sale M.D.   On: 05/04/2020 14:49   DG Chest 2 View  Result Date: 05/03/2020 CLINICAL DATA:  Shortness of breath, pleural effusion EXAM: CHEST - 2 VIEW COMPARISON:  CT chest, 05/02/2020, chest radiographs, 05/01/2020 FINDINGS: Large left, small right pleural effusions with associated atelectasis or consolidation are not significantly changed compared to prior examination. No new airspace opacity. The cardiac borders are largely obscured. Osseous structures are unremarkable. IMPRESSION: Large left, small right pleural effusions with associated atelectasis or  consolidation are not significantly changed compared to prior examination. No new airspace opacity. Electronically Signed   By: Eddie Candle M.D.   On: 05/03/2020 08:22   DG Chest 2 View  Result Date: 05/01/2020 CLINICAL DATA:  Shortness of breath for 5 days, diagnosed with a LEFT pleural effusion on Friday, history hypertension, former smoker EXAM: CHEST - 2 VIEW COMPARISON:  04/28/2020 FINDINGS: Subtotal opacification of the LEFT hemithorax by large LEFT pleural effusion. Significant atelectasis of LEFT lung and mediastinal shift LEFT to RIGHT. Small RIGHT pleural effusion identified. Heart size poorly seen. No pneumothorax or RIGHT lung consolidation identified. Bones demineralized. IMPRESSION: Large LEFT pleural effusion with subtotal atelectasis of LEFT lung and mediastinal shift LEFT to RIGHT little changed since 04/28/2020. Electronically Signed   By: Lavonia Dana M.D.   On: 05/01/2020 16:54   DG Chest 2 View  Result Date: 04/28/2020 CLINICAL DATA:  Chronic cough and shortness of breath EXAM: CHEST - 2 VIEW COMPARISON:  April 21, 2020 FINDINGS: There is a large left pleural effusion. There may well be a degree of underlying atelectasis and/or consolidation on the left. There is a minimal right pleural effusion. Right lung otherwise clear. Heart size is normal. Pulmonary vascular on the right is normal. There is distortion of pulmonary vascular on the left due to the large effusion. No adenopathy appreciable. No bone lesions. IMPRESSION: Large left pleural effusion occupying much of the left hemithorax, similar to recent prior study. Question underlying atelectasis and/or consolidation on the left. Right lung clear except for minimal right pleural effusion. Heart size normal. These results will be called to the ordering clinician or representative by the Radiologist Assistant, and communication documented in the PACS or [employer]. Electronically Signed   By: Lowella Grip III M.D.   On:  04/28/2020 12:51   CT CHEST W CONTRAST  Result Date: 05/04/2020 CLINICAL DATA:  Pleural effusion, left thoracentesis, abnormal chest x-ray EXAM: CT CHEST WITH CONTRAST TECHNIQUE: Multidetector CT imaging of the chest was performed during intravenous contrast administration. CONTRAST:  54mL OMNIPAQUE IOHEXOL 300 MG/ML  SOLN COMPARISON:  05/02/2020 FINDINGS: Cardiovascular: The heart is unremarkable without pericardial  effusion. There is normal caliber of the thoracic aorta with no aneurysm or dissection. Minimal atherosclerosis. No evidence of pulmonary embolus. There is extrinsic compression of the left upper lobe pulmonary artery. Mediastinum/Nodes: Subcarinal adenopathy measuring 14 mm in short axis. Thyroid, trachea, and esophagus are unremarkable. Lungs/Pleura: There is masslike consolidation of the left upper lobe abutting the mediastinum, measuring 4.5 x 4.1 x 4.6 cm (see image 61/3 and image 59/5). Medial extent of this mass invades the mediastinum at the level of the aortic or pulmonary window, reference image 57/3 and 59/5. Findings are consistent with underlying malignancy and postobstructive change. PET CT may better delineate the mass from the postobstructive change. Bronchoscopy may be useful for tissue diagnosis if recent thoracentesis was nondiagnostic for malignancy. There are small bilateral pleural effusions. On the left, there is a small loculated component in the anterior left hemithorax adjacent to the left upper lobe mass described above. Scattered hypoventilatory changes are seen at the lung bases and within the lingula. Interstitial prominence within the left upper lobe could reflect lymphangitic spread of disease. Upper Abdomen: Complex left renal cysts are unchanged. No acute upper abdominal process. Musculoskeletal: No acute or destructive bony lesions. Reconstructed images demonstrate no additional findings. IMPRESSION: 1. Left upper lobe masslike consolidation consistent with  malignancy. A portion of the mass invades the mediastinum at the level of the aortic or pulmonary window. PET-CT may be useful to delineate the mass from the postobstructive change. 2. Prominence of the left upper lobe pulmonary interstitium, concerning for lymphangitic spread of disease. 3. A subcarinal lymphadenopathy worrisome for nodal metastasis. Again, PET-CT may be useful. 4. Small bilateral pleural effusions, partially loculated on the left. Decreased left effusion after recent thoracentesis. 5.  Aortic Atherosclerosis (ICD10-I70.0). Electronically Signed   By: Randa Ngo M.D.   On: 05/04/2020 23:09   CT ABDOMEN PELVIS W CONTRAST  Result Date: 05/05/2020 CLINICAL DATA:  Urologic cancer surveillance EXAM: CT ABDOMEN AND PELVIS WITH CONTRAST TECHNIQUE: Multidetector CT imaging of the abdomen and pelvis was performed using the standard protocol following bolus administration of intravenous contrast. CONTRAST:  38mL OMNIPAQUE IOHEXOL 300 MG/ML  SOLN COMPARISON:  CT chest, 05/04/2020, CT chest abdomen pelvis, 05/02/2020 FINDINGS: Lower chest: Small, left greater than right bilateral pleural effusions, similar to prior examination. Nodular thickening of the dependent left-sided parietal pleura (series 3, image 11). Hepatobiliary: Multiple subcentimeter fluid and low-attenuation lesions of the liver, not significant changed compared to prior examination, and most likely benign incidental cysts and/or hemangiomata. No gallstones, gallbladder wall thickening, or biliary dilatation. Pancreas: There is an unchanged 1.1 cm fluid low-attenuation lesion of the pancreatic tail (series 3, image 26). No pancreatic ductal dilatation or surrounding inflammatory changes. Spleen: Normal in size without significant abnormality. Adrenals/Urinary Tract: Adrenal glands are unremarkable. There are multiple bilateral simple renal cysts, the largest in the midportion of the left kidney and inferior pole of the right kidney.  Kidneys are otherwise normal, without renal calculi, solid lesion, or hydronephrosis. Bladder is unremarkable. Stomach/Bowel: Stomach is within normal limits. Appendix is not clearly visualized and may be diminutive or surgically absent. There is redemonstrated mild rectal wall thickening and minimal perirectal fat stranding (series 3, image 69). Vascular/Lymphatic: Aortic atherosclerosis. No enlarged abdominal or pelvic lymph nodes. Reproductive: Status post hysterectomy. Other: No abdominal wall hernia or abnormality. No abdominopelvic ascites. Musculoskeletal: No acute or significant osseous findings. IMPRESSION: 1. No definite evidence of malignancy or metastatic disease in the abdomen or pelvis. Examination is generally unchanged compared to  recent CT dated 05/02/2020. 2. No evidence of urologic malignancy per ordering indication. Definitively benign bilateral renal cysts for which no further characterization or follow-up is specifically required. 3. Multiple subcentimeter low-attenuation lesions of the liver, generally too small to confidently characterize, although the largest are clearly simple cysts, remaining smaller lesions almost certainly incidental small cysts or hemangiomata. No findings suspicious for hepatic metastatic disease. 4. There is redemonstrated mild rectal wall thickening and minimal perirectal fat stranding, again suggesting nonspecific proctitis. Correlate with referable signs and symptoms if present. Synchronous rectal malignancy would be difficult to strictly exclude. Consider digital and/or endoscopic examination. 5. There is an unchanged 1.1 cm fluid low-attenuation lesion of the pancreatic tail. 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 There is no pancreatic ductal dilatation. Given new diagnosis of advanced stage lung malignancy, recommend attention on follow-up with future clinically indicated oncology imaging. 6. Small, left greater than right bilateral pleural effusions, similar  to prior examination. Nodular thickening of the dependent left-sided parietal pleura. 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 Aortic Atherosclerosis (ICD10-I70.0). Electronically Signed   By: Eddie Candle M.D.   On: 05/05/2020 19:22   CT CHEST ABDOMEN PELVIS W CONTRAST  Addendum Date: 05/02/2020   ADDENDUM REPORT: 05/02/2020 04:13 ADDENDUM: These results were called by telephone at the time of interpretation on 05/02/2020 at 4:13 am to provider [facility] , who verbally acknowledged these results. Electronically Signed   By: Lovena Le M.D.   On: 05/02/2020 04:13   Result Date: 05/02/2020 CLINICAL DATA:  Pleural effusion, malignancy suspected EXAM: CT CHEST, ABDOMEN, AND PELVIS WITH CONTRAST TECHNIQUE: Multidetector CT imaging of the chest, abdomen and pelvis was performed following the standard protocol during bolus administration of intravenous contrast. CONTRAST:  150mL OMNIPAQUE IOHEXOL 300 MG/ML  SOLN COMPARISON:  Radiograph 05/02/2019 FINDINGS: CT CHEST FINDINGS Cardiovascular: The aortic root is suboptimally assessed given cardiac pulsation artifact. Atherosclerotic plaque within the normal caliber aorta. No acute luminal abnormality of the imaged aorta. No periaortic stranding or hemorrhage. Normal 3 vessel branching of the aortic arch. Proximal great vessels are mildly calcified but otherwise unremarkable. Cardiac size is within normal limits. Trace pericardial effusion. Coronary artery calcifications. Central pulmonary arteries are normal caliber. No large central filling defects are present with more distal evaluation limited on this non tailored examination of the pulmonary arteries. Mediastinum/Nodes: Scattered conspicuous mediastinal nodes including several enlarged nodes towards the cardiac apex measuring up to 10 mm short axis (3/46) additional posterior mediastinal nodes are present including a left periaortic node measuring 8 mm (3/43  and several subpleural nodules, likely small lymph nodes (3/36) subcarinal lymph node measures up to 16 mm (3/26). Lungs/Pleura: Moderate left and small right pleural effusions. The left pleural effusion demonstrates areas of conspicuous pleural thickening (3/34) and small pleural nodules (3/23). While there are adjacent areas of passive atelectatic change heterogeneous enhancement of the atelectatic lung parenchyma particularly within collapsed portions of lingula, anterior segment left upper lobe and anterior basal segment of the left lower raise concern for underlying airspace disease or malignancy. Aerated portions of the left upper and lower lobe demonstrate additional mixed ground-glass and consolidative opacity worrisome for are further airspace disease. Relative sparing in the aerated portions of the right lung. Musculoskeletal: There is asymmetric soft tissue edema across the left chest wall. No clear extension of the pleural or airspace processes in to the chest wall proper. No acute or worrisome osseous lesions. Heterogeneously dense breast tissue without focal breast  nodularity or masses. Few benign macro calcifications. Multilevel degenerative changes in the spine and shoulders. Dextrocurvature of the thoracic spine. CT ABDOMEN PELVIS FINDINGS Hepatobiliary: Few scattered subcentimeter hypoattenuating foci are present in the liver, largest in the left lobe measuring up to 8 mm (3/51). Too small to fully characterize on CT imaging. No other focal concerning liver lesions. Gallbladder is unremarkable. No pericholecystic fluid or inflammation. No visible intraductal gallstones or biliary dilatation. Pancreas: No pancreatic ductal dilatation or surrounding inflammatory changes. Spleen: Few punctate hypoattenuating foci in the spleen are too small to characterize. No worrisome focal splenic lesions. Normal splenic size. Adrenals/Urinary Tract: No concerning adrenal nodules or masses. Multiple fluid  attenuation cysts are present in both kidneys, largest are present in the upper pole left kidney measuring up to 7.4 cm with some thin peripheral mural calcification (Bosniak 2). No concerning focal renal lesions. No urolithiasis or hydronephrosis. Urinary bladder is largely decompressed at the time of exam and therefore poorly evaluated by CT imaging. No gross bladder abnormality. Stomach/Bowel: Mild thickening at the gastric antrum is likely related to normal peristaltic motion. Duodenum is unremarkable. High attenuation enteric contrast material partially traverses the colon. No small bowel thickening or dilatation. No proximal colonic abnormalities are identified. Some questionable thickening of the rectosigmoid though may be related to underdistention. However some perirectal fat stranding is present which is nonspecific. No evidence of obstruction. Appendix is not visualized. No focal inflammation the vicinity of the cecum to suggest an occult appendicitis. Vascular/Lymphatic: Atherosclerotic calcifications within the abdominal aorta and branch vessels. No aneurysm or ectasia. No enlarged abdominopelvic lymph nodes. Reproductive: Patient appears to be post hysterectomy. No concerning adnexal lesion. Other: Mild presacral fat stranding and trace fluid in the pelvis, possibly reactive fluid status or reactive change. No free air. Mild body wall edema most pronounced over the left flank and hip. No bowel containing hernias. Musculoskeletal: Multilevel degenerative changes are present in the imaged portions of the spine. Stepwise retrolisthesis L1-L4. Levocurvature of the lumbar spine. No spondylolysis. Additional degenerative changes in the hips and pelvis. No acute osseous abnormality or suspicious osseous lesion. Sclerotic focus in the right ilium (6/53) is likely bone island. IMPRESSION: 1. Moderate left and small right pleural effusions. The left pleural effusion demonstrates areas of conspicuous pleural  thickening and small pleural nodularity. While this could reflect empyema, a malignant effusion with pleural deposits would present similarly. Furthermore, there is heterogeneous enhancement of the adjacent atelectatic lung parenchyma including within the lingula, anterior segment left upper lobe and anterior basal segment of the left lower lobe. Could reflect underlying airspace disease or malignancy with additional airspace disease seen throughout aerated portions of the left lung. 2. Asymmetric soft tissue edema across the left chest wall. No clear extension of the pleural or airspace processes in to the chest wall proper. Possibly reactive though should with exam findings and continued attention on follow-up imaging is recommended. 3. Some questionable thickening of the rectosigmoid though may be related to underdistention though some mild perirectal fat stranding is present however and could suggest an underlying proctitis. 4. Aortic Atherosclerosis (ICD10-I70.0). Currently attempting to contact the ordering provider with a critical value result. Addendum will be submitted upon case discussion. Electronically Signed: By: Lovena Le M.D. On: 05/02/2020 04:04   DG CHEST PORT 1 VIEW  Result Date: 05/11/2020 CLINICAL DATA:  Shortness of breath with cough EXAM: PORTABLE CHEST 1 VIEW COMPARISON:  May 10, 2020 FINDINGS: Left drainage catheter present medially with interval drainage of most  of the pleural effusion compared to 1 day prior. Small amount of residual effusion remains with atelectatic change in the left mid lower lung regions. Apparent consolidation abutting the left hilum is present. On the right, there is a small pleural effusion with slight right base atelectasis. Heart is upper normal in size with pulmonary vascularity normal. No adenopathy no bone lesions. IMPRESSION: Most of pleural fluid drain on the left following drainage catheter placement. No pneumothorax. Consolidation overlying the  left hilum/perihilar region again noted. Stable cardiac silhouette. Small right pleural effusion with mild right base atelectasis. Right lung otherwise clear. Electronically Signed   By: Lowella Grip III M.D.   On: 05/11/2020 08:04   DG CHEST PORT 1 VIEW  Result Date: 05/10/2020 CLINICAL DATA:  Short of breath EXAM: PORTABLE CHEST 1 VIEW COMPARISON:  05/09/2020 FINDINGS: Extensive consolidation left lower lobe with mild progression, with associated pleural effusion. No pneumothorax Mild right lower lobe airspace disease and small right effusion unchanged. Negative for heart failure or edema. IMPRESSION: Extensive consolidation left lower lobe with mild progression. Left pleural effusion not well visualized due to consolidation. Mild right lower lobe airspace disease unchanged. Electronically Signed   By: Franchot Gallo M.D.   On: 05/10/2020 08:07   DG CHEST PORT 1 VIEW  Result Date: 05/09/2020 CLINICAL DATA:  Shortness of breath. EXAM: PORTABLE CHEST 1 VIEW COMPARISON:  Chest x-ray 05/06/2020 and CT chest 05/05/2020. FINDINGS: Stable left hilar mass with progressive surrounding airspace consolidation and near complete left lower lobe atelectasis. Suspect enlarging left pleural effusion. Stable small right pleural effusion. The right lung remains relatively clear. IMPRESSION: Stable left hilar mass with progressive surrounding airspace consolidation and near complete left lower lobe/lingular atelectasis. Suspect enlarging left pleural effusion. Electronically Signed   By: Marijo Sanes M.D.   On: 05/09/2020 09:25   DG CHEST PORT 1 VIEW  Result Date: 05/05/2020 CLINICAL DATA:  SOB. Hx of valvular heart disease and HTN EXAM: PORTABLE CHEST 1 VIEW COMPARISON:  05/04/2020 FINDINGS: Normal cardiac silhouette. Dense streaky LEFT lobe opacity increased from prior. Obscuration of the heart border. Small LEFT effusion. RIGHT lung is clear. Lungs are hyperinflated. No pneumothorax IMPRESSION: LEFT lower  lobe and lingular pneumonia and probable effusion. Electronically Signed   By: Suzy Bouchard M.D.   On: 05/05/2020 09:04   DG CHEST PORT 1 VIEW  Result Date: 05/04/2020 CLINICAL DATA:  Shortness of breath.  Pleural effusion. EXAM: PORTABLE CHEST 1 VIEW COMPARISON:  May 03, 2020. FINDINGS: Increased size of a large left pleural effusion. Overlying left opacities. Similar versus slightly increased small right pleural effusion. No visible pneumothorax. Cardiac silhouette is largely obscured. No acute osseous abnormality. IMPRESSION: 1. Increased large left pleural effusion. Overlying left opacities may represent pneumonia and/or atelectasis. 2. Similar versus slightly increased small right pleural effusion. Electronically Signed   By: Margaretha Sheffield MD   On: 05/04/2020 08:10   DG Chest Portable 1 View  Result Date: 05/01/2020 CLINICAL DATA:  Shortness of breath, status post thoracentesis. EXAM: PORTABLE CHEST 1 VIEW COMPARISON:  Chest x-ray 05/01/2020 FINDINGS: The heart size and mediastinal contours are not well visualized due to overlying pleural effusion and pulmonary changes. These airspace opacity of the left lung. No pulmonary edema. Interval decrease in a now small to moderate volume left pleural effusion. Persistent trace right pleural effusion. No pneumothorax. No acute osseous abnormality. IMPRESSION: 1. Interval decrease in size of a now small to moderate volume left pleural effusion. Underlying infection, inflammation, pulmonary mass  not excluded. 2. Trace right pleural effusion. Electronically Signed   By: Iven Finn M.D.   On: 05/01/2020 20:50   ECHOCARDIOGRAM COMPLETE  Result Date: 05/04/2020    ECHOCARDIOGRAM REPORT   Patient Name:   KIARI HOSMER Date of Exam: 05/04/2020 Medical Rec #:  [id number]          Height:       64.0 in Accession #:    [id number]         Weight:       142.6 lb Date of Birth:  [birth year]           BSA:          1.695 m Patient Age:    85 years            BP:           150/66 mmHg Patient Gender: F                  HR:           88 bpm. Exam Location:  Inpatient Procedure: 2D Echo, Cardiac Doppler and Color Doppler Indications:    Dyspnea  History:        Patient has prior history of Echocardiogram examinations, most                 recent 03/27/2018. Signs/Symptoms:Shortness of Breath; Risk                 Factors:Hypertension, Dyslipidemia and Former Smoker. Large left                 pleural effusion.  Sonographer:    Dustin Flock Referring Phys: [phone] MURALI RAMASWAMY  Sonographer Comments: Technically challenging study due to limited acoustic windows. Difficult windows due to large pleural effusion. IMPRESSIONS  1. Left ventricular ejection fraction, by estimation, is 60 to 65%. The left ventricle has normal function. The left ventricle has no regional wall motion abnormalities. Left ventricular diastolic parameters are consistent with Grade I diastolic dysfunction (impaired relaxation). Elevated left ventricular end-diastolic pressure.  2. Right ventricular systolic function is normal. The right ventricular size is normal.  3. The mitral valve is degenerative. No evidence of mitral valve regurgitation. No evidence of mitral stenosis. Moderate mitral annular calcification.  4. The aortic valve is normal in structure. Aortic valve regurgitation is not visualized. No aortic stenosis is present.  5. The inferior vena cava is normal in size with greater than 50% respiratory variability, suggesting right atrial pressure of 3 mmHg. FINDINGS  Left Ventricle: Left ventricular ejection fraction, by estimation, is 60 to 65%. The left ventricle has normal function. The left ventricle has no regional wall motion abnormalities. The left ventricular internal cavity size was normal in size. There is  no left ventricular hypertrophy. Left ventricular diastolic parameters are consistent with Grade I diastolic dysfunction (impaired relaxation). Elevated left  ventricular end-diastolic pressure. Right Ventricle: The right ventricular size is normal. No increase in right ventricular wall thickness. Right ventricular systolic function is normal. Left Atrium: Left atrial size was normal in size. Right Atrium: Right atrial size was normal in size. Pericardium: There is no evidence of pericardial effusion. Mitral Valve: The mitral valve is degenerative in appearance. Moderate mitral annular calcification. No evidence of mitral valve regurgitation. No evidence of mitral valve stenosis. Tricuspid Valve: The tricuspid valve is normal in structure. Tricuspid valve regurgitation is not demonstrated. No evidence of tricuspid stenosis. Aortic Valve: The aortic valve is normal  in structure. Aortic valve regurgitation is not visualized. No aortic stenosis is present. Pulmonic Valve: The pulmonic valve was normal in structure. Pulmonic valve regurgitation is not visualized. No evidence of pulmonic stenosis. Aorta: The aortic root is normal in size and structure. Venous: The inferior vena cava is normal in size with greater than 50% respiratory variability, suggesting right atrial pressure of 3 mmHg. IAS/Shunts: No atrial level shunt detected by color flow Doppler.  LEFT VENTRICLE PLAX 2D LVIDd:         3.30 cm Diastology LVIDs:         1.60 cm LV e' medial:    4.03 cm/s LV PW:         1.00 cm LV E/e' medial:  19.9 LV IVS:        1.00 cm LV e' lateral:   5.00 cm/s                        LV E/e' lateral: 16.0  RIGHT VENTRICLE RV Basal diam:  2.20 cm RV S prime:     2.50 cm/s TAPSE (M-mode): 2.0 cm LEFT ATRIUM             Index       RIGHT ATRIUM          Index LA Vol (A2C):   26.5 ml 15.64 ml/m RA Area:     8.27 cm LA Vol (A4C):   23.7 ml 13.99 ml/m RA Volume:   15.00 ml 8.85 ml/m LA Biplane Vol: 26.8 ml 15.82 ml/m  AORTIC VALVE LVOT Vmax:   120.00 cm/s LVOT Vmean:  84.000 cm/s LVOT VTI:    0.220 m  AORTA Ao Root diam: 2.60 cm MITRAL VALVE MV Area (PHT): 3.60 cm     SHUNTS MV Decel  Time: 211 msec     Systemic VTI: 0.22 m MV E velocity: 80.20 cm/s MV A velocity: 127.00 cm/s MV E/A ratio:  0.63 Skeet Latch MD Electronically signed by Skeet Latch MD Signature Date/Time: 05/04/2020/11:23:20 AM    Final    VAS [place] LOWER EXTREMITY VENOUS (DVT)  Result Date: 05/04/2020  Lower Venous DVT Study Indications: Dyspnea and fatigue x 10 days.  Risk Factors: Recent pleural effusion and thoracentesis w/ suspected malignancy. Performing Technologist: Rogelia Rohrer  Examination Guidelines: A complete evaluation includes B-mode imaging, spectral Doppler, color Doppler, and power Doppler as needed of all accessible portions of each vessel. Bilateral testing is considered an integral part of a complete examination. Limited examinations for reoccurring indications may be performed as noted. The reflux portion of the exam is performed with the patient in reverse Trendelenburg.  +---------+---------------+---------+-----------+----------+--------------+ RIGHT    CompressibilityPhasicitySpontaneityPropertiesThrombus Aging +---------+---------------+---------+-----------+----------+--------------+ CFV      Full           Yes      Yes                                 +---------+---------------+---------+-----------+----------+--------------+ SFJ      Full                                                        +---------+---------------+---------+-----------+----------+--------------+ FV Prox  Full           Yes  Yes                                 +---------+---------------+---------+-----------+----------+--------------+ FV Mid   Full           Yes      Yes                                 +---------+---------------+---------+-----------+----------+--------------+ FV DistalFull           Yes      Yes                                 +---------+---------------+---------+-----------+----------+--------------+ PFV      Full                                                         +---------+---------------+---------+-----------+----------+--------------+ POP      Full           Yes      Yes                                 +---------+---------------+---------+-----------+----------+--------------+ PTV      Full                                                        +---------+---------------+---------+-----------+----------+--------------+ PERO     Full                                                        +---------+---------------+---------+-----------+----------+--------------+   +---------+---------------+---------+-----------+----------+--------------+ LEFT     CompressibilityPhasicitySpontaneityPropertiesThrombus Aging +---------+---------------+---------+-----------+----------+--------------+ CFV      Full           Yes      Yes                                 +---------+---------------+---------+-----------+----------+--------------+ SFJ      Full                                                        +---------+---------------+---------+-----------+----------+--------------+ FV Prox  Full           Yes      Yes                                 +---------+---------------+---------+-----------+----------+--------------+ FV Mid   Full           Yes      Yes                                 +---------+---------------+---------+-----------+----------+--------------+  FV DistalFull           Yes      Yes                                 +---------+---------------+---------+-----------+----------+--------------+ PFV      Full                                                        +---------+---------------+---------+-----------+----------+--------------+ POP      Full           Yes      Yes                                 +---------+---------------+---------+-----------+----------+--------------+ PTV      Full                                                         +---------+---------------+---------+-----------+----------+--------------+ PERO     Full                                                        +---------+---------------+---------+-----------+----------+--------------+     Summary: RIGHT: - There is no evidence of deep vein thrombosis in the lower extremity.  - No cystic structure found in the popliteal fossa.  LEFT: - There is no evidence of deep vein thrombosis in the lower extremity.  - No cystic structure found in the popliteal fossa.  *See table(s) above for measurements and observations. Electronically signed by Monica Martinez MD on 05/04/2020 at 7:55:52 PM.    Final    IR THORACENTESIS ASP PLEURAL SPACE W/IMG GUIDE  Result Date: 05/04/2020 INDICATION: Patient with history of acute hypoxic respiratory failure secondary to recurrent left pleural effusion. Request is made for diagnostic and therapeutic left thoracentesis. EXAM: ULTRASOUND GUIDED DIAGNOSTIC AND THERAPEUTIC THORACENTESIS MEDICATIONS: 10 mL 2% lidocaine COMPLICATIONS: None immediate. PROCEDURE: An ultrasound guided thoracentesis was thoroughly discussed with the patient and questions answered. The benefits, risks, alternatives and complications were also discussed. The patient understands and wishes to proceed with the procedure. Written consent was obtained. Ultrasound was performed to localize and mark an adequate pocket of fluid in the left chest. The area was then prepped and draped in the normal sterile fashion. 1% Lidocaine was used for local anesthesia. Under ultrasound guidance a 6 Fr Safe-T-Centesis catheter was introduced. Thoracentesis was performed. The catheter was removed and a dressing applied. FINDINGS: A total of approximately 1.55 L of hazy amber fluid was removed. Samples were sent to the laboratory as requested by the clinical team. IMPRESSION: Successful ultrasound guided left thoracentesis yielding 1.55 L of pleural fluid. Read by: Earley Abide, PA-C  Electronically Signed   By: Markus Daft M.D.   On: 05/04/2020 15:12   IR PERC PLEURAL DRAIN W/INDWELL CATH W/IMG GUIDE  Result Date: 05/10/2020 CLINICAL DATA:  Recurrent malignant left pleural effusion  and need for tunneled pleural drainage catheter prior to discharge from the hospital. EXAM: INSERTION OF TUNNELED PLEURAL DRAINAGE CATHETER ANESTHESIA/SEDATION: 1.0 mg IV Versed; 50 mcg IV Fentanyl. Total Moderate Sedation Time: 27 minutes. The patient's level of consciousness and physiologic status were continuously monitored during the procedure by Radiology nursing. MEDICATIONS: 2 g IV Ancef. Antibiotic was administered in an appropriate time interval for the procedure. FLUOROSCOPY TIME:  6 seconds.  1.0 mGy. PROCEDURE: The procedure, risks, benefits, and alternatives were explained to the patient. Questions regarding the procedure were encouraged and answered. The patient understands and consents to the procedure. A time-out was performed prior to initiating the procedure. The left chest wall was prepped with chlorhexidine in a sterile fashion, and a sterile drape was applied covering the operative field. A sterile gown and sterile gloves were used for the procedure. Local anesthesia was provided with 1% Lidocaine. Ultrasound image documentation was performed. Fluoroscopy during the procedure and fluoroscopic spot radiograph confirms appropriate catheter position. After creating a small skin incision, a 19 gauge needle was advanced into the pleural cavity under ultrasound guidance. A guide wire was then advanced under fluoroscopy into the pleural space. Pleural access was dilated serially and a 16-French peel-away sheath placed. A 15.5 French tunneled PleurX catheter was placed. This was tunneled from an incision 5 cm below the pleural access to the access site. The catheter was advanced through the peel-away sheath. The sheath was then removed. Final catheter positioning was confirmed with a fluoroscopic spot  image. The access incision was closed with subcutaneous and subcuticular 4-0 Vicryl. Dermabond was applied to the incision. A Prolene retention suture was applied at the catheter exit site. Thoracentesis was then performed via a vacuum bottle. COMPLICATIONS: None. FINDINGS: The catheter was placed via the left lower lateral chest wall. Approximately 1.1 liters of pleural fluid was able to be removed after catheter placement. IMPRESSION: Placement of permanent, tunneled left pleural drainage catheter via lateral approach. 1.1 liters of pleural fluid was removed today after catheter placement. Electronically Signed   By: Aletta Edouard M.D.   On: 05/10/2020 14:15     Thoracentesis 05/01/20 removed 1500 mL                         Thoracentesis 05/04/20 removed 1.55 Liters of Hazy Amber Fluid                           Tunneled Plure-X Catheter Placement on 05/10/20 placed via right midaxillary approach that extends towards apex. 1.1 L of pleural fluid removed after tube placement.  ECHOCARDIOGRAM IMPRESSIONS    1. Left ventricular ejection fraction, by estimation, is 60 to 65%. The  left ventricle has normal function. The left ventricle has no regional  wall motion abnormalities. Left ventricular diastolic parameters are  consistent with Grade I diastolic  dysfunction (impaired relaxation). Elevated left ventricular end-diastolic  pressure.  2. Right ventricular systolic function is normal. The right ventricular  size is normal.  3. The mitral valve is degenerative. No evidence of mitral valve  regurgitation. No evidence of mitral stenosis. Moderate mitral annular  calcification.  4. The aortic valve is normal in structure. Aortic valve regurgitation is  not visualized. No aortic stenosis is present.  5. The inferior vena cava is normal in size with greater than 50%  respiratory variability, suggesting right atrial pressure of 3 mmHg.   FINDINGS  Left Ventricle: Left ventricular  ejection fraction, by estimation, is 60  to 65%. The left ventricle has normal function. The left ventricle has no  regional wall motion abnormalities. The left ventricular internal cavity  size was normal in size. There is  no left ventricular hypertrophy. Left ventricular diastolic parameters  are consistent with Grade I diastolic dysfunction (impaired relaxation).  Elevated left ventricular end-diastolic pressure.   Right Ventricle: The right ventricular size is normal. No increase in  right ventricular wall thickness. Right ventricular systolic function is  normal.   Left Atrium: Left atrial size was normal in size.   Right Atrium: Right atrial size was normal in size.   Pericardium: There is no evidence of pericardial effusion.   Mitral Valve: The mitral valve is degenerative in appearance. Moderate  mitral annular calcification. No evidence of mitral valve regurgitation.  No evidence of mitral valve stenosis.   Tricuspid Valve: The tricuspid valve is normal in structure. Tricuspid  valve regurgitation is not demonstrated. No evidence of tricuspid  stenosis.   Aortic Valve: The aortic valve is normal in structure. Aortic valve  regurgitation is not visualized. No aortic stenosis is present.   Pulmonic Valve: The pulmonic valve was normal in structure. Pulmonic valve  regurgitation is not visualized. No evidence of pulmonic stenosis.   Aorta: The aortic root is normal in size and structure.   Venous: The inferior vena cava is normal in size with greater than 50%  respiratory variability, suggesting right atrial pressure of 3 mmHg.   IAS/Shunts: No atrial level shunt detected by color flow Doppler.     LEFT VENTRICLE  PLAX 2D  LVIDd:     3.30 cm Diastology  LVIDs:     1.60 cm LV e' medial:  4.03 cm/s  LV PW:     1.00 cm LV E/e' medial: 19.9  LV IVS:    1.00 cm LV e' lateral:  5.00 cm/s             LV E/e' lateral: 16.0     RIGHT  VENTRICLE  RV Basal diam: 2.20 cm  RV S prime:   2.50 cm/s  TAPSE (M-mode): 2.0 cm   LEFT ATRIUM       Index    RIGHT ATRIUM     Index  LA Vol (A2C):  26.5 ml 15.64 ml/m RA Area:   8.27 cm  LA Vol (A4C):  23.7 ml 13.99 ml/m RA Volume:  15.00 ml 8.85 ml/m  LA Biplane Vol: 26.8 ml 15.82 ml/m  AORTIC VALVE  LVOT Vmax:  120.00 cm/s  LVOT Vmean: 84.000 cm/s  LVOT VTI:  0.220 m    AORTA  Ao Root diam: 2.60 cm   MITRAL VALVE  MV Area (PHT): 3.60 cm   SHUNTS  MV Decel Time: 211 msec   Systemic VTI: 0.22 m  MV E velocity: 80.20 cm/s  MV A velocity: 127.00 cm/s  MV E/A ratio: 0.63   Subjective: Seen and examined at bedside and states that she still did not sleep very well but feels better today than she did yesterday and was not in much pain on the left side of her ribs. Denies any chest pain, lightheadedness or dizziness. No other concerns complaints this time and ready to be going to a skilled nursing facility.  Discharge Exam: Vitals:   05/11/20 2056 05/12/20 0530  BP: 135/72 (!) 148/83  Pulse: 90 91  Resp: 19 18  Temp: 98.1 F (36.7 C) 98.1 F (36.7 C)  SpO2: 95% 98%  Vitals:   05/11/20 0500 05/11/20 1526 05/11/20 2056 05/12/20 0530  BP: 117/80 132/72 135/72 (!) 148/83  Pulse: 80 87 90 91  Resp: 17 20 19 18   Temp: 98 F (36.7 C) (!) 97.4 F (36.3 C) 98.1 F (36.7 C) 98.1 F (36.7 C)  TempSrc: Oral Oral Oral Oral  SpO2: 96% 96% 95% 98%  Weight:      Height:       General: Pt is alert, awake, not in acute distress Cardiovascular: RRR, S1/S2 +, no rubs, no gallops Respiratory: Diminished bilaterally with coarse breath sounds worse in the left compared to right and does have some slight rhonchi. Wearing supplemental oxygen via nasal cannula but has no appreciable wheezing. Wearing 2 L of supplemental oxygen via nasal cannula Abdominal: Soft, NT, ND, bowel sounds + Extremities: Has 1+ nonpitting edema, no cyanosis  The results of  significant diagnostics from this hospitalization (including imaging, microbiology, ancillary and laboratory) are listed below for reference.    Microbiology: Recent Results (from the past 240 hour(s))  Culture, blood (routine x 2)     Status: None   Collection Time: 05/02/20  2:52 PM   Specimen: BLOOD  Result Value Ref Range Status   Specimen Description BLOOD RIGHT ANTECUBITAL  Final   Special Requests   Final    BOTTLES DRAWN AEROBIC AND ANAEROBIC Blood Culture adequate volume   Culture   Final    NO GROWTH 5 DAYS Performed at [facility], [address] [place], [place] [address]    Report Status 05/07/2020 FINAL  Final  Body fluid culture     Status: None   Collection Time: 05/04/20  2:09 PM   Specimen: Lung, Left; Pleural Fluid  Result Value Ref Range Status   Specimen Description PLEURAL FLUID  Final   Special Requests LEFT LUNG  Final   Gram Stain   Final    RARE WBC PRESENT, PREDOMINANTLY MONONUCLEAR NO ORGANISMS SEEN    Culture   Final    NO GROWTH Performed at [facility], [place] [address], [place], [place] [address]    Report Status 05/07/2020 FINAL  Final  Acid Fast Smear (AFB)     Status: None   Collection Time: 05/04/20  2:09 PM   Specimen: Lung, Left; Pleural Fluid  Result Value Ref Range Status   AFB Specimen Processing Concentration  Final   Acid Fast Smear Negative  Final    Comment: (NOTE) Performed At: [facility] [place], [place] [id number] Rush Farmer MD QQ:[id number]    Source (AFB) PLEURAL  Final    Comment: FLUID LEFT LUNG Performed at [facility], [place] [address] [place], [place] [address]   Resp Panel by RT-PCR (Flu A&B, Covid) Nasopharyngeal Swab     Status: None   Collection Time: 05/11/20  1:13 PM   Specimen: Nasopharyngeal Swab; Nasopharyngeal(NP) swabs in vial transport medium  Result Value Ref Range Status   SARS Coronavirus 2 by RT PCR NEGATIVE NEGATIVE Final    Comment:  (NOTE) SARS-CoV-2 target nucleic acids are NOT DETECTED.  The SARS-CoV-2 RNA is generally detectable in upper respiratory specimens during the acute phase of infection. The lowest concentration of SARS-CoV-2 viral copies this assay can detect is 138 copies/mL. A negative result does not preclude SARS-Cov-2 infection and should not be used as the sole basis for treatment or other patient management decisions. A negative result may occur with  improper specimen collection/handling, submission of specimen  other than nasopharyngeal swab, presence of viral mutation(s) within the areas targeted by this assay, and inadequate number of viral copies(<138 copies/mL). A negative result must be combined with clinical observations, patient history, and epidemiological information. The expected result is Negative.  Fact Sheet for Patients:  EntrepreneurPulse.com.au  Fact Sheet for Healthcare Providers:  IncredibleEmployment.be  This test is no t yet approved or cleared by the [place] FDA and  has been authorized for detection and/or diagnosis of SARS-CoV-2 by FDA under an Emergency Use Authorization (EUA). This EUA will remain  in effect (meaning this test can be used) for the duration of the COVID-19 declaration under Section 564(b)(1) of the Act, 21 U.S.C.section 360bbb-3(b)(1), unless the authorization is terminated  or revoked sooner.       Influenza A by PCR NEGATIVE NEGATIVE Final   Influenza B by PCR NEGATIVE NEGATIVE Final    Comment: (NOTE) The Xpert Xpress SARS-CoV-2/FLU/RSV plus assay is intended as an aid in the diagnosis of influenza from Nasopharyngeal swab specimens and should not be used as a sole basis for treatment. Nasal washings and aspirates are unacceptable for Xpert Xpress SARS-CoV-2/FLU/RSV testing.  Fact Sheet for Patients: EntrepreneurPulse.com.au  Fact Sheet for Healthcare  Providers: IncredibleEmployment.be  This test is not yet approved or cleared by the [place] FDA and has been authorized for detection and/or diagnosis of SARS-CoV-2 by FDA under an Emergency Use Authorization (EUA). This EUA will remain in effect (meaning this test can be used) for the duration of the COVID-19 declaration under Section 564(b)(1) of the Act, 21 U.S.C. section 360bbb-3(b)(1), unless the authorization is terminated or revoked.  Performed at [facility], [place] [address] [place], [place] [address]     Labs: BNP (last 3 results) Recent Labs    05/01/20 1632  BNP 86.7   Basic Metabolic Panel: Recent Labs  Lab 05/07/20 0144 05/09/20 0319 05/10/20 0322 05/11/20 0246  NA 135 136 137 135  K 3.6 4.3 4.2 3.9  CL 102 106 105 104  CO2 25 26 23 24   GLUCOSE 118* 108* 112* 115*  BUN 9 8 11 12   CREATININE 0.90 0.79 0.77 0.81  CALCIUM 9.7 9.6 9.8 9.6  MG  --  2.1 2.1 2.1  PHOS  --  3.1 3.0 2.7   Liver Function Tests: Recent Labs  Lab 05/07/20 0144 05/09/20 0319 05/10/20 0322 05/11/20 0246  AST 20 17 24 19   ALT 22 20 20 17   ALKPHOS 48 51 54 48  BILITOT 0.8 0.5 0.6 0.8  PROT 5.0* 5.1* 5.2* 5.2*  ALBUMIN 2.4* 2.4* 2.5* 2.4*   No results for input(s): LIPASE, AMYLASE in the last 168 hours. No results for input(s): AMMONIA in the last 168 hours. CBC: Recent Labs  Lab 05/07/20 0144 05/08/20 1812 05/09/20 0319 05/10/20 0322 05/11/20 0246  WBC 13.3* 12.7* 11.6* 12.2* 11.1*  NEUTROABS  --   --  8.5* 9.5* 8.1*  HGB 13.9 15.4* 14.0 14.2 14.6  HCT 39.2 44.2 43.3 43.1 42.3  MCV 86.2 87.2 90.8 89.0 87.9  PLT 276 283 248 251 237   Cardiac Enzymes: No results for input(s): CKTOTAL, CKMB, CKMBINDEX, TROPONINI in the last 168 hours. BNP: Invalid input(s): POCBNP CBG: No results for input(s): GLUCAP in the last 168 hours. D-Dimer No results for input(s): DDIMER in the last 72 hours. Hgb A1c No results for input(s): HGBA1C in the  last 72 hours. Lipid Profile No results for input(s): CHOL, HDL, LDLCALC, TRIG, CHOLHDL, LDLDIRECT in the  last 72 hours. Thyroid function studies No results for input(s): TSH, T4TOTAL, T3FREE, THYROIDAB in the last 72 hours.  Invalid input(s): FREET3 Anemia work up No results for input(s): VITAMINB12, FOLATE, FERRITIN, TIBC, IRON, RETICCTPCT in the last 72 hours. Urinalysis No results found for: COLORURINE, APPEARANCEUR, [place], [place], [place], [place], [place], [place], PROTEINUR, UROBILINOGEN, NITRITE, LEUKOCYTESUR Sepsis Labs Invalid input(s): PROCALCITONIN,  WBC,  LACTICIDVEN Microbiology Recent Results (from the past 240 hour(s))  Culture, blood (routine x 2)     Status: None   Collection Time: 05/02/20  2:52 PM   Specimen: BLOOD  Result Value Ref Range Status   Specimen Description BLOOD RIGHT ANTECUBITAL  Final   Special Requests   Final    BOTTLES DRAWN AEROBIC AND ANAEROBIC Blood Culture adequate volume   Culture   Final    NO GROWTH 5 DAYS Performed at [facility], [address] [place], [place] [address]    Report Status 05/07/2020 FINAL  Final  Body fluid culture     Status: None   Collection Time: 05/04/20  2:09 PM   Specimen: Lung, Left; Pleural Fluid  Result Value Ref Range Status   Specimen Description PLEURAL FLUID  Final   Special Requests LEFT LUNG  Final   Gram Stain   Final    RARE WBC PRESENT, PREDOMINANTLY MONONUCLEAR NO ORGANISMS SEEN    Culture   Final    NO GROWTH Performed at [facility], [place] [address], [place], [place] [address]    Report Status 05/07/2020 FINAL  Final  Acid Fast Smear (AFB)     Status: None   Collection Time: 05/04/20  2:09 PM   Specimen: Lung, Left; Pleural Fluid  Result Value Ref Range Status   AFB Specimen Processing Concentration  Final   Acid Fast Smear Negative  Final    Comment: (NOTE) Performed At: [facility] [place], [place] [id number] Rush Farmer MD  QM:[id number]    Source (AFB) PLEURAL  Final    Comment: FLUID LEFT LUNG Performed at [facility], [place] [address] [place], [place] [address]   Resp Panel by RT-PCR (Flu A&B, Covid) Nasopharyngeal Swab     Status: None   Collection Time: 05/11/20  1:13 PM   Specimen: Nasopharyngeal Swab; Nasopharyngeal(NP) swabs in vial transport medium  Result Value Ref Range Status   SARS Coronavirus 2 by RT PCR NEGATIVE NEGATIVE Final    Comment: (NOTE) SARS-CoV-2 target nucleic acids are NOT DETECTED.  The SARS-CoV-2 RNA is generally detectable in upper respiratory specimens during the acute phase of infection. The lowest concentration of SARS-CoV-2 viral copies this assay can detect is 138 copies/mL. A negative result does not preclude SARS-Cov-2 infection and should not be used as the sole basis for treatment or other patient management decisions. A negative result may occur with  improper specimen collection/handling, submission of specimen other than nasopharyngeal swab, presence of viral mutation(s) within the areas targeted by this assay, and inadequate number of viral copies(<138 copies/mL). A negative result must be combined with clinical observations, patient history, and epidemiological information. The expected result is Negative.  Fact Sheet for Patients:  EntrepreneurPulse.com.au  Fact Sheet for Healthcare Providers:  IncredibleEmployment.be  This test is no t yet approved or cleared by the [place] FDA and  has been authorized for detection and/or diagnosis of SARS-CoV-2 by FDA under an Emergency Use Authorization (EUA). This EUA will remain  in effect (meaning this test can be used)  for the duration of the COVID-19 declaration under Section 564(b)(1) of the Act, 21 U.S.C.section 360bbb-3(b)(1), unless the authorization is terminated  or revoked sooner.       Influenza A by PCR NEGATIVE NEGATIVE Final   Influenza B by PCR  NEGATIVE NEGATIVE Final    Comment: (NOTE) The Xpert Xpress SARS-CoV-2/FLU/RSV plus assay is intended as an aid in the diagnosis of influenza from Nasopharyngeal swab specimens and should not be used as a sole basis for treatment. Nasal washings and aspirates are unacceptable for Xpert Xpress SARS-CoV-2/FLU/RSV testing.  Fact Sheet for Patients: EntrepreneurPulse.com.au  Fact Sheet for Healthcare Providers: IncredibleEmployment.be  This test is not yet approved or cleared by the [place] FDA and has been authorized for detection and/or diagnosis of SARS-CoV-2 by FDA under an Emergency Use Authorization (EUA). This EUA will remain in effect (meaning this test can be used) for the duration of the COVID-19 declaration under Section 564(b)(1) of the Act, 21 U.S.C. section 360bbb-3(b)(1), unless the authorization is terminated or revoked.  Performed at [facility], [place] [address] [place], [place] [address]    Time coordinating discharge: 35 minutes  SIGNED:  Kerney Elbe, DO [facility] 05/12/2020, 7:57 AM Pager is on [place]  If 7PM-7AM, please contact night-coverage [email or web address]

## 2020-05-12 NOTE — TOC Transition Note (Addendum)
Transition of Care [facility]) - CM/SW Discharge Note   Patient Details  Name: Shelley Flores MRN: [id number] Date of Birth: Oct 08, 1932  [facility] CM/SW Contact:  Angelita Ingles, RN Phone Number: [phone]  05/12/2020, 9:03 AM   Clinical Narrative:    Covid results are in and updated FL2 is signed. This patient is ready to discharge just waiting on ok to transport. Message has been left for Bobbietta SW at [facility].                                                          1015 CM just spoke with [place] who states that it is ok to send patient. Transport has been set up with [facility]. Bedside nurse made aware. No further needs noted at this time.   Please call report to [place] Bed #4 [phone] Ext [phone]        Patient Goals and CMS Choice        Discharge Placement                       Discharge Plan and Services                                     Social Determinants of Health (SDOH) Interventions     Readmission Risk Interventions No flowsheet data found.

## 2020-05-12 NOTE — Progress Notes (Signed)
RN attempted report to [facility]. Will try again.

## 2020-05-15 ENCOUNTER — Other Ambulatory Visit: Payer: Self-pay | Admitting: Physician Assistant

## 2020-05-15 ENCOUNTER — Telehealth: Payer: Self-pay | Admitting: Physician Assistant

## 2020-05-15 ENCOUNTER — Non-Acute Institutional Stay (SKILLED_NURSING_FACILITY): Payer: Medicare PPO | Admitting: Nurse Practitioner

## 2020-05-15 ENCOUNTER — Encounter: Payer: Self-pay | Admitting: Nurse Practitioner

## 2020-05-15 DIAGNOSIS — M810 Age-related osteoporosis without current pathological fracture: Secondary | ICD-10-CM | POA: Diagnosis not present

## 2020-05-15 DIAGNOSIS — R609 Edema, unspecified: Secondary | ICD-10-CM

## 2020-05-15 DIAGNOSIS — E876 Hypokalemia: Secondary | ICD-10-CM

## 2020-05-15 DIAGNOSIS — K219 Gastro-esophageal reflux disease without esophagitis: Secondary | ICD-10-CM

## 2020-05-15 DIAGNOSIS — I10 Essential (primary) hypertension: Secondary | ICD-10-CM | POA: Diagnosis not present

## 2020-05-15 DIAGNOSIS — R918 Other nonspecific abnormal finding of lung field: Secondary | ICD-10-CM | POA: Diagnosis not present

## 2020-05-15 DIAGNOSIS — E871 Hypo-osmolality and hyponatremia: Secondary | ICD-10-CM

## 2020-05-15 DIAGNOSIS — J90 Pleural effusion, not elsewhere classified: Secondary | ICD-10-CM | POA: Diagnosis not present

## 2020-05-15 DIAGNOSIS — C3492 Malignant neoplasm of unspecified part of left bronchus or lung: Secondary | ICD-10-CM

## 2020-05-15 DIAGNOSIS — E785 Hyperlipidemia, unspecified: Secondary | ICD-10-CM | POA: Diagnosis not present

## 2020-05-15 NOTE — Telephone Encounter (Signed)
Requested PDL1 for the patient's cytology from 05/04/20. We will perform guardant blood first tissue next blood test at her appointment on 05/17/20.

## 2020-05-15 NOTE — Assessment & Plan Note (Signed)
-   Repeat BMP

## 2020-05-15 NOTE — Assessment & Plan Note (Signed)
OP the patient requested to be off Fosamax.

## 2020-05-15 NOTE — Progress Notes (Signed)
Location:    SNF [place] Room Number: 4 Place of Service:  SNF (31) Provider: Lennie Odor Korbyn Chopin NP  Cari Caraway, MD  Patient Care Team: Cari Caraway, MD as PCP - General [facility] Medicine)  Extended Emergency Contact Information Primary Emergency Contact: [facility] Address: [address]          [place], [place] [address] [name] of [place] Phone: [phone] Work Phone: [phone] Relation: Other Secondary Emergency Contact: swiftmick, pat Mobile Phone: [phone] Relation: Friend  Code Status:  DNR Goals of care: Advanced Directive information Advanced Directives 05/15/2020  Does Patient Have a Medical Advance Directive? Yes  Type of Paramedic of [place];Living will  Does patient want to make changes to medical advance directive? No - Patient declined  Copy of [place] in Chart? Yes - validated most recent copy scanned in chart (See row information)     Chief Complaint  Patient presents with  . Acute Visit    Medication review    HPI:  Pt is a 87 y.o. female seen today for an acute visit for review medications.  Hospitalized 05/01/20-05/11/20 for a large left pleural effusion, s/p thoracentesis x2, drained 1.5L, treated with Ceftriaxone and Azithromycin initially and stopped when Cytology showed adenocarcinoma, CT showed left upper lobe mass invades the mediastinum at the level of the aortic or pulmonayr window, concerning for lymphangitic spread of disease, pleurX placed, drain prn daily up to 1L/day, call for less than 160ml daily x 3 consecutive drains qod for possible removal of the catheter,  f/u pulmonology Dr. Valeta Harms 05/22/20, oncology Dr. Julien Nordmann 05/17/20, update CBC/CMP, Mg, Phos, CXR one week  HTN, takes Metoprolol, Amlodipine.   Hyperlipidemia, takes Atorvastatin  SOB/mild valvular heart disease  GERD, takes Omeprazole, poor appetite, early satiety, f/u oncology eval for  mass.  Hypophosphatemia, P 3.0  Hypokalemia, corrected, K 3.9 05/11/20  Hyponatremia, corrected, Na 135 05/11/20  Edema BL BNP normal, EF 60-65%, negative DVT venous US. Takes HCTZ, Bun/creat 12/0.81 05/11/20  OP the patient requested to be off Fosamax.    Past Medical History:  Diagnosis Date  . Bruit    Abdominal bruit - Abdominal aorta/renal duplex Doppler evaluation 12/07/03 -Mildly abnormal evaluation. 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 This is consistant w/median arcuate ligament compression syndrome. *Right & Left Kidney: Essentially equal in size, symmetrical in shape w/no significant abnormalities visualized. *Right & Left Renal Arteries: No significant abnormalities.  . Edema extremities    LE edema   . H/O myocardial perfusion scan 02/20/00   To rule out ischemia - Negative adequate Bruce protocol exercise stress test with a deconditioned exercise response and normal static myocardial perfusion images with EF calculated by QGS of 77%. Represents a low risk study.  . Hyperlipidemia   . Hypertension   . Osteoarthritis   . Osteoporosis   . Pleural effusion 04/2020  . Valvular heart disease    Mild valvular heart disease by Echo in 2010, all mild and symptomatic, including concentric LVH, MR, TR, and AI with pulmonary artery pressure of 32. EF was normal.   Past Surgical History:  Procedure Laterality Date  . Abdominal aorta/Renal duplex Doppler Evaluation  12/07/03   For abdominal bruit. Mildly abnormal evaluation. (See bruit in medical history)  . BREAST EXCISIONAL BIOPSY Left 1966  . BREAST EXCISIONAL BIOPSY Left 1999  . CATARACT EXTRACTION  2003  . COLONOSCOPY  10/22/2004  . DEXA Bone Scan  06/23/2013  . IR PERC  PLEURAL DRAIN W/INDWELL CATH W/IMG GUIDE  05/10/2020  . IR THORACENTESIS ASP PLEURAL SPACE W/IMG GUIDE  05/04/2020    Allergies  Allergen Reactions  . Irbesartan Nausea Only    Allergies as of 05/15/2020      Reactions   Irbesartan Nausea  Only      Medication List       Accurate as of May 15, 2020 11:59 PM. If you have any questions, ask your nurse or doctor.        STOP taking these medications   PriLOSEC OTC 20 MG tablet Generic drug: omeprazole Stopped by: Kollen Armenti X Anny Sayler, NP     TAKE these medications   acetaminophen 325 MG tablet Commonly known as: TYLENOL Take 2 tablets (650 mg total) by mouth every 6 (six) hours as needed for mild pain (or Fever >/= 101).   alendronate 70 MG tablet Commonly known as: FOSAMAX Take 70 mg by mouth every Saturday.   amLODipine 5 MG tablet Commonly known as: NORVASC Take 5 mg by mouth daily.   aspirin 81 MG tablet Take 81 mg by mouth daily.   atorvastatin 20 MG tablet Commonly known as: LIPITOR TAKE 1 TABLET BY MOUTH EVERY DAY What changed: when to take this   BENZONATATE PO Take 1 capsule by mouth 3 (three) times daily.   Glucosamine HCl 1000 MG Tabs Take 1,000 mg by mouth daily.   guaiFENesin-dextromethorphan 100-10 MG/5ML syrup Commonly known as: ROBITUSSIN DM Take 5 mLs by mouth every 4 (four) hours as needed for cough.   hydrochlorothiazide 12.5 MG capsule Commonly known as: MICROZIDE TAKE 1 CAPSULE BY MOUTH EVERY DAY What changed: how much to take   metoprolol succinate 50 MG 24 hr tablet Commonly known as: TOPROL-XL TAKE 1 TABLET (50 MG TOTAL) BY MOUTH DAILY. TAKE WITH OR IMMEDIATELY FOLLOWING A MEAL.   multivitamin tablet Take 1 tablet by mouth daily.   omeprazole 20 MG capsule Commonly known as: PRILOSEC Take 20 mg by mouth 2 (two) times daily.   Viactiv Calcium Plus D 650-12.5-40 MG-MCG-MCG Chew Generic drug: Calcium-Vitamin D-Vitamin K Chew 1 tablet by mouth daily.   zinc oxide 20 % ointment Apply 1 application topically as needed for irritation. apply to buttocks/peri, topical, As Needed, To buttocks after every incontinent episode and as needed for redness. May keep at bedside.       Review of Systems  Constitutional: Positive for  appetite change and fatigue. Negative for chills, diaphoresis and fever.  HENT: Positive for hearing loss. Negative for congestion, trouble swallowing and voice change.   Eyes: Negative for visual disturbance.  Respiratory: Positive for shortness of breath. Negative for cough and wheezing.   Cardiovascular: Positive for leg swelling. Negative for chest pain and palpitations.  Gastrointestinal: Positive for abdominal distention. Negative for abdominal pain, blood in stool, constipation, diarrhea and vomiting.       Poor appetite, early satiety.   Genitourinary: Negative for difficulty urinating, dysuria, hematuria and urgency.  Musculoskeletal: Positive for gait problem.  Skin: Negative for color change and pallor.  Neurological: Negative for speech difficulty, weakness, light-headedness and headaches.  Psychiatric/Behavioral: Negative for behavioral problems, hallucinations and sleep disturbance. The patient is not nervous/anxious.     Immunization History  Administered Date(s) Administered  . Fluad Quad(high Dose 65+) 01/06/2019, 02/08/2020  . Influenza Split 08/14/2010, 01/16/2012, 02/02/2013, 01/24/2015, 02/01/2016, 02/19/2017, 01/28/2018  . Influenza, High Dose Seasonal PF 01/24/2015, 02/01/2016, 01/28/2018  . Influenza-Unspecified 02/01/2016  . Moderna Sars-Covid-2 Vaccination 05/17/2019, 06/14/2019  .  Pneumococcal Conjugate-13 08/11/2013, 09/03/2013  . Pneumococcal Polysaccharide-23 05/28/2004  . Td 07/04/2000  . Tdap 07/04/2009, 10/18/2019  . Unspecified SARS-COV-2 Vaccination 03/27/2020  . Zoster 07/02/2005   Pertinent  Health Maintenance Due  Topic Date Due  . INFLUENZA VACCINE  Completed  . DEXA SCAN  Completed  . PNA vac Low Risk Adult  Completed   No flowsheet data found. Functional Status Survey:    Vitals:   05/15/20 1146  BP: 134/71  Pulse: 98  Resp: 18  Temp: 98.8 F (37.1 C)  SpO2: 95%  Weight: 122 lb (55.3 kg)  Height: 5\' 4"  (1.626 m)   Body mass  index is 20.94 kg/m. Physical Exam Vitals and nursing note reviewed.  Constitutional:      General: She is not in acute distress.    Appearance: Normal appearance. She is not ill-appearing, toxic-appearing or diaphoretic.  HENT:     Head: Normocephalic and atraumatic.     Nose: Nose normal.     Mouth/Throat:     Mouth: Mucous membranes are moist.  Eyes:     Extraocular Movements: Extraocular movements intact.     Conjunctiva/sclera: Conjunctivae normal.     Pupils: Pupils are equal, round, and reactive to light.  Cardiovascular:     Rate and Rhythm: Normal rate and regular rhythm.     Heart sounds: Murmur heard.    Pulmonary:     Effort: Pulmonary effort is normal.     Breath sounds: Rales present. No wheezing or rhonchi.     Comments: L pleurx Abdominal:     General: Bowel sounds are normal. There is distension.     Palpations: Abdomen is soft.     Tenderness: There is no abdominal tenderness. There is no right CVA tenderness, left CVA tenderness, guarding or rebound.  Musculoskeletal:     Cervical back: Normal range of motion and neck supple.     Right lower leg: Edema present.     Left lower leg: Edema present.     Comments: Trace to 1+ edema BLE  Skin:    General: Skin is warm and dry.  Neurological:     General: No focal deficit present.     Mental Status: She is alert and oriented to person, place, and time. Mental status is at baseline.     Gait: Gait abnormal.  Psychiatric:        Mood and Affect: Mood normal.        Behavior: Behavior normal.        Thought Content: Thought content normal.        Judgment: Judgment normal.     Labs reviewed: Recent Labs    05/09/20 0319 05/10/20 0322 05/11/20 0246  NA 136 137 135  K 4.3 4.2 3.9  CL 106 105 104  CO2 26 23 24   GLUCOSE 108* 112* 115*  BUN 8 11 12   CREATININE 0.79 0.77 0.81  CALCIUM 9.6 9.8 9.6  MG 2.1 2.1 2.1  PHOS 3.1 3.0 2.7   Recent Labs    05/09/20 0319 05/10/20 0322 05/11/20 0246  AST  17 24 19   ALT 20 20 17   ALKPHOS 51 54 48  BILITOT 0.5 0.6 0.8  PROT 5.1* 5.2* 5.2*  ALBUMIN 2.4* 2.5* 2.4*   Recent Labs    05/09/20 0319 05/10/20 0322 05/11/20 0246  WBC 11.6* 12.2* 11.1*  NEUTROABS 8.5* 9.5* 8.1*  HGB 14.0 14.2 14.6  HCT 43.3 43.1 42.3  MCV 90.8 89.0 87.9  PLT 248  251 237   No results found for: TSH No results found for: HGBA1C Lab Results  Component Value Date   CHOL 139 04/14/2020   HDL 53 04/14/2020   LDLCALC 68 04/14/2020   TRIG 97 04/14/2020   CHOLHDL 2.6 04/14/2020    Significant Diagnostic Results in last 30 days:  DG Chest 1 View  Result Date: 05/06/2020 CLINICAL DATA:  Shortness of breath. EXAM: CHEST  1 VIEW COMPARISON:  05/05/2020 FINDINGS: 0926 hours. Interval progression of left parahilar and retrocardiac basilar collapse/consolidative opacity. Left pleural effusion noted. There is probable some minimal atelectasis at the right base with tiny right pleural effusion. IMPRESSION: 1. Interval progression of left parahilar and retrocardiac basilar collapse/consolidative opacity. 2. Left pleural effusion. 3. Similar appearance of minimal atelectasis/infiltrate at the right base with tiny right effusion. Electronically Signed   By: Misty Stanley M.D.   On: 05/06/2020 09:33   DG Chest 1 View  Result Date: 05/04/2020 CLINICAL DATA:  Post thoracentesis on the left. EXAM: CHEST  1 VIEW COMPARISON:  Radiographs earlier today and 05/03/2020. CT 05/02/2020. FINDINGS: 1416 hours. Interval decreased size of left pleural effusion following thoracentesis. There is improved aeration of the left lung with persistent left perihilar opacity which may reflect a mass or infiltrate. No pneumothorax. The right lung remains clear. There is a stable small right pleural effusion. The heart size and mediastinal contours are stable. IMPRESSION: 1. Interval decreased size of left pleural effusion following thoracentesis with improved aeration of the left lung. No pneumothorax.  2. Persistent left perihilar opacity may reflect a mass or infiltrate. Electronically Signed   By: Richardean Sale M.D.   On: 05/04/2020 14:49   DG Chest 2 View  Result Date: 05/03/2020 CLINICAL DATA:  Shortness of breath, pleural effusion EXAM: CHEST - 2 VIEW COMPARISON:  CT chest, 05/02/2020, chest radiographs, 05/01/2020 FINDINGS: Large left, small right pleural effusions with associated atelectasis or consolidation are not significantly changed compared to prior examination. No new airspace opacity. The cardiac borders are largely obscured. Osseous structures are unremarkable. IMPRESSION: Large left, small right pleural effusions with associated atelectasis or consolidation are not significantly changed compared to prior examination. No new airspace opacity. Electronically Signed   By: Eddie Candle M.D.   On: 05/03/2020 08:22   DG Chest 2 View  Result Date: 05/01/2020 CLINICAL DATA:  Shortness of breath for 5 days, diagnosed with a LEFT pleural effusion on Friday, history hypertension, former smoker EXAM: CHEST - 2 VIEW COMPARISON:  04/28/2020 FINDINGS: Subtotal opacification of the LEFT hemithorax by large LEFT pleural effusion. Significant atelectasis of LEFT lung and mediastinal shift LEFT to RIGHT. Small RIGHT pleural effusion identified. Heart size poorly seen. No pneumothorax or RIGHT lung consolidation identified. Bones demineralized. IMPRESSION: Large LEFT pleural effusion with subtotal atelectasis of LEFT lung and mediastinal shift LEFT to RIGHT little changed since 04/28/2020. Electronically Signed   By: Lavonia Dana M.D.   On: 05/01/2020 16:54   DG Chest 2 View  Result Date: 04/28/2020 CLINICAL DATA:  Chronic cough and shortness of breath EXAM: CHEST - 2 VIEW COMPARISON:  April 21, 2020 FINDINGS: There is a large left pleural effusion. There may well be a degree of underlying atelectasis and/or consolidation on the left. There is a minimal right pleural effusion. Right lung otherwise  clear. Heart size is normal. Pulmonary vascular on the right is normal. There is distortion of pulmonary vascular on the left due to the large effusion. No adenopathy appreciable. No bone lesions. IMPRESSION:  Large left pleural effusion occupying much of the left hemithorax, similar to recent prior study. Question underlying atelectasis and/or consolidation on the left. Right lung clear except for minimal right pleural effusion. Heart size normal. These results will be called to the ordering clinician or representative by the Radiologist Assistant, and communication documented in the PACS or [employer]. Electronically Signed   By: Lowella Grip III M.D.   On: 04/28/2020 12:51   CT CHEST W CONTRAST  Result Date: 05/04/2020 CLINICAL DATA:  Pleural effusion, left thoracentesis, abnormal chest x-ray EXAM: CT CHEST WITH CONTRAST TECHNIQUE: Multidetector CT imaging of the chest was performed during intravenous contrast administration. CONTRAST:  68mL OMNIPAQUE IOHEXOL 300 MG/ML  SOLN COMPARISON:  05/02/2020 FINDINGS: Cardiovascular: The heart is unremarkable without pericardial effusion. There is normal caliber of the thoracic aorta with no aneurysm or dissection. Minimal atherosclerosis. No evidence of pulmonary embolus. There is extrinsic compression of the left upper lobe pulmonary artery. Mediastinum/Nodes: Subcarinal adenopathy measuring 14 mm in short axis. Thyroid, trachea, and esophagus are unremarkable. Lungs/Pleura: There is masslike consolidation of the left upper lobe abutting the mediastinum, measuring 4.5 x 4.1 x 4.6 cm (see image 61/3 and image 59/5). Medial extent of this mass invades the mediastinum at the level of the aortic or pulmonary window, reference image 57/3 and 59/5. Findings are consistent with underlying malignancy and postobstructive change. PET CT may better delineate the mass from the postobstructive change. Bronchoscopy may be useful for tissue diagnosis if recent  thoracentesis was nondiagnostic for malignancy. There are small bilateral pleural effusions. On the left, there is a small loculated component in the anterior left hemithorax adjacent to the left upper lobe mass described above. Scattered hypoventilatory changes are seen at the lung bases and within the lingula. Interstitial prominence within the left upper lobe could reflect lymphangitic spread of disease. Upper Abdomen: Complex left renal cysts are unchanged. No acute upper abdominal process. Musculoskeletal: No acute or destructive bony lesions. Reconstructed images demonstrate no additional findings. IMPRESSION: 1. Left upper lobe masslike consolidation consistent with malignancy. A portion of the mass invades the mediastinum at the level of the aortic or pulmonary window. PET-CT may be useful to delineate the mass from the postobstructive change. 2. Prominence of the left upper lobe pulmonary interstitium, concerning for lymphangitic spread of disease. 3. A subcarinal lymphadenopathy worrisome for nodal metastasis. Again, PET-CT may be useful. 4. Small bilateral pleural effusions, partially loculated on the left. Decreased left effusion after recent thoracentesis. 5.  Aortic Atherosclerosis (ICD10-I70.0). Electronically Signed   By: Randa Ngo M.D.   On: 05/04/2020 23:09   CT ABDOMEN PELVIS W CONTRAST  Result Date: 05/05/2020 CLINICAL DATA:  Urologic cancer surveillance EXAM: CT ABDOMEN AND PELVIS WITH CONTRAST TECHNIQUE: Multidetector CT imaging of the abdomen and pelvis was performed using the standard protocol following bolus administration of intravenous contrast. CONTRAST:  58mL OMNIPAQUE IOHEXOL 300 MG/ML  SOLN COMPARISON:  CT chest, 05/04/2020, CT chest abdomen pelvis, 05/02/2020 FINDINGS: Lower chest: Small, left greater than right bilateral pleural effusions, similar to prior examination. Nodular thickening of the dependent left-sided parietal pleura (series 3, image 11). Hepatobiliary:  Multiple subcentimeter fluid and low-attenuation lesions of the liver, not significant changed compared to prior examination, and most likely benign incidental cysts and/or hemangiomata. No gallstones, gallbladder wall thickening, or biliary dilatation. Pancreas: There is an unchanged 1.1 cm fluid low-attenuation lesion of the pancreatic tail (series 3, image 26). No pancreatic ductal dilatation or surrounding inflammatory changes. Spleen: Normal in size  without significant abnormality. Adrenals/Urinary Tract: Adrenal glands are unremarkable. There are multiple bilateral simple renal cysts, the largest in the midportion of the left kidney and inferior pole of the right kidney. Kidneys are otherwise normal, without renal calculi, solid lesion, or hydronephrosis. Bladder is unremarkable. Stomach/Bowel: Stomach is within normal limits. Appendix is not clearly visualized and may be diminutive or surgically absent. There is redemonstrated mild rectal wall thickening and minimal perirectal fat stranding (series 3, image 69). Vascular/Lymphatic: Aortic atherosclerosis. No enlarged abdominal or pelvic lymph nodes. Reproductive: Status post hysterectomy. Other: No abdominal wall hernia or abnormality. No abdominopelvic ascites. Musculoskeletal: No acute or significant osseous findings. IMPRESSION: 1. No definite evidence of malignancy or metastatic disease in the abdomen or pelvis. Examination is generally unchanged compared to recent CT dated 05/02/2020. 2. No evidence of urologic malignancy per ordering indication. Definitively benign bilateral renal cysts for which no further characterization or follow-up is specifically required. 3. Multiple subcentimeter low-attenuation lesions of the liver, generally too small to confidently characterize, although the largest are clearly simple cysts, remaining smaller lesions almost certainly incidental small cysts or hemangiomata. No findings suspicious for hepatic metastatic  disease. 4. There is redemonstrated mild rectal wall thickening and minimal perirectal fat stranding, again suggesting nonspecific proctitis. Correlate with referable signs and symptoms if present. Synchronous rectal malignancy would be difficult to strictly exclude. Consider digital and/or endoscopic examination. 5. There is an unchanged 1.1 cm fluid low-attenuation lesion of the pancreatic tail. 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 There is no pancreatic ductal dilatation. Given new diagnosis of advanced stage lung malignancy, recommend attention on follow-up with future clinically indicated oncology imaging. 6. Small, left greater than right bilateral pleural effusions, similar to prior examination. Nodular thickening of the dependent left-sided parietal pleura. 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 Aortic Atherosclerosis (ICD10-I70.0). Electronically Signed   By: Eddie Candle M.D.   On: 05/05/2020 19:22   CT CHEST ABDOMEN PELVIS W CONTRAST  Addendum Date: 05/02/2020   ADDENDUM REPORT: 05/02/2020 04:13 ADDENDUM: These results were called by telephone at the time of interpretation on 05/02/2020 at 4:13 am to provider [facility] , who verbally acknowledged these results. Electronically Signed   By: Lovena Le M.D.   On: 05/02/2020 04:13   Result Date: 05/02/2020 CLINICAL DATA:  Pleural effusion, malignancy suspected EXAM: CT CHEST, ABDOMEN, AND PELVIS WITH CONTRAST TECHNIQUE: Multidetector CT imaging of the chest, abdomen and pelvis was performed following the standard protocol during bolus administration of intravenous contrast. CONTRAST:  172mL OMNIPAQUE IOHEXOL 300 MG/ML  SOLN COMPARISON:  Radiograph 05/02/2019 FINDINGS: CT CHEST FINDINGS Cardiovascular: The aortic root is suboptimally assessed given cardiac pulsation artifact. Atherosclerotic plaque within the normal caliber aorta. No acute luminal abnormality of the imaged aorta. No  periaortic stranding or hemorrhage. Normal 3 vessel branching of the aortic arch. Proximal great vessels are mildly calcified but otherwise unremarkable. Cardiac size is within normal limits. Trace pericardial effusion. Coronary artery calcifications. Central pulmonary arteries are normal caliber. No large central filling defects are present with more distal evaluation limited on this non tailored examination of the pulmonary arteries. Mediastinum/Nodes: Scattered conspicuous mediastinal nodes including several enlarged nodes towards the cardiac apex measuring up to 10 mm short axis (3/46) additional posterior mediastinal nodes are present including a left periaortic node measuring 8 mm (3/43 and several subpleural nodules, likely small lymph nodes (3/36) subcarinal lymph node measures up to 16 mm (3/26). Lungs/Pleura: Moderate left and small right pleural  effusions. The left pleural effusion demonstrates areas of conspicuous pleural thickening (3/34) and small pleural nodules (3/23). While there are adjacent areas of passive atelectatic change heterogeneous enhancement of the atelectatic lung parenchyma particularly within collapsed portions of lingula, anterior segment left upper lobe and anterior basal segment of the left lower raise concern for underlying airspace disease or malignancy. Aerated portions of the left upper and lower lobe demonstrate additional mixed ground-glass and consolidative opacity worrisome for are further airspace disease. Relative sparing in the aerated portions of the right lung. Musculoskeletal: There is asymmetric soft tissue edema across the left chest wall. No clear extension of the pleural or airspace processes in to the chest wall proper. No acute or worrisome osseous lesions. Heterogeneously dense breast tissue without focal breast nodularity or masses. Few benign macro calcifications. Multilevel degenerative changes in the spine and shoulders. Dextrocurvature of the thoracic  spine. CT ABDOMEN PELVIS FINDINGS Hepatobiliary: Few scattered subcentimeter hypoattenuating foci are present in the liver, largest in the left lobe measuring up to 8 mm (3/51). Too small to fully characterize on CT imaging. No other focal concerning liver lesions. Gallbladder is unremarkable. No pericholecystic fluid or inflammation. No visible intraductal gallstones or biliary dilatation. Pancreas: No pancreatic ductal dilatation or surrounding inflammatory changes. Spleen: Few punctate hypoattenuating foci in the spleen are too small to characterize. No worrisome focal splenic lesions. Normal splenic size. Adrenals/Urinary Tract: No concerning adrenal nodules or masses. Multiple fluid attenuation cysts are present in both kidneys, largest are present in the upper pole left kidney measuring up to 7.4 cm with some thin peripheral mural calcification (Bosniak 2). No concerning focal renal lesions. No urolithiasis or hydronephrosis. Urinary bladder is largely decompressed at the time of exam and therefore poorly evaluated by CT imaging. No gross bladder abnormality. Stomach/Bowel: Mild thickening at the gastric antrum is likely related to normal peristaltic motion. Duodenum is unremarkable. High attenuation enteric contrast material partially traverses the colon. No small bowel thickening or dilatation. No proximal colonic abnormalities are identified. Some questionable thickening of the rectosigmoid though may be related to underdistention. However some perirectal fat stranding is present which is nonspecific. No evidence of obstruction. Appendix is not visualized. No focal inflammation the vicinity of the cecum to suggest an occult appendicitis. Vascular/Lymphatic: Atherosclerotic calcifications within the abdominal aorta and branch vessels. No aneurysm or ectasia. No enlarged abdominopelvic lymph nodes. Reproductive: Patient appears to be post hysterectomy. No concerning adnexal lesion. Other: Mild presacral fat  stranding and trace fluid in the pelvis, possibly reactive fluid status or reactive change. No free air. Mild body wall edema most pronounced over the left flank and hip. No bowel containing hernias. Musculoskeletal: Multilevel degenerative changes are present in the imaged portions of the spine. Stepwise retrolisthesis L1-L4. Levocurvature of the lumbar spine. No spondylolysis. Additional degenerative changes in the hips and pelvis. No acute osseous abnormality or suspicious osseous lesion. Sclerotic focus in the right ilium (6/53) is likely bone island. IMPRESSION: 1. Moderate left and small right pleural effusions. The left pleural effusion demonstrates areas of conspicuous pleural thickening and small pleural nodularity. While this could reflect empyema, a malignant effusion with pleural deposits would present similarly. Furthermore, there is heterogeneous enhancement of the adjacent atelectatic lung parenchyma including within the lingula, anterior segment left upper lobe and anterior basal segment of the left lower lobe. Could reflect underlying airspace disease or malignancy with additional airspace disease seen throughout aerated portions of the left lung. 2. Asymmetric soft tissue edema across the left chest  wall. No clear extension of the pleural or airspace processes in to the chest wall proper. Possibly reactive though should with exam findings and continued attention on follow-up imaging is recommended. 3. Some questionable thickening of the rectosigmoid though may be related to underdistention though some mild perirectal fat stranding is present however and could suggest an underlying proctitis. 4. Aortic Atherosclerosis (ICD10-I70.0). Currently attempting to contact the ordering provider with a critical value result. Addendum will be submitted upon case discussion. Electronically Signed: By: Lovena Le M.D. On: 05/02/2020 04:04   DG CHEST PORT 1 VIEW  Result Date: 05/11/2020 CLINICAL DATA:   Shortness of breath with cough EXAM: PORTABLE CHEST 1 VIEW COMPARISON:  May 10, 2020 FINDINGS: Left drainage catheter present medially with interval drainage of most of the pleural effusion compared to 1 day prior. Small amount of residual effusion remains with atelectatic change in the left mid lower lung regions. Apparent consolidation abutting the left hilum is present. On the right, there is a small pleural effusion with slight right base atelectasis. Heart is upper normal in size with pulmonary vascularity normal. No adenopathy no bone lesions. IMPRESSION: Most of pleural fluid drain on the left following drainage catheter placement. No pneumothorax. Consolidation overlying the left hilum/perihilar region again noted. Stable cardiac silhouette. Small right pleural effusion with mild right base atelectasis. Right lung otherwise clear. Electronically Signed   By: Lowella Grip III M.D.   On: 05/11/2020 08:04   DG CHEST PORT 1 VIEW  Result Date: 05/10/2020 CLINICAL DATA:  Short of breath EXAM: PORTABLE CHEST 1 VIEW COMPARISON:  05/09/2020 FINDINGS: Extensive consolidation left lower lobe with mild progression, with associated pleural effusion. No pneumothorax Mild right lower lobe airspace disease and small right effusion unchanged. Negative for heart failure or edema. IMPRESSION: Extensive consolidation left lower lobe with mild progression. Left pleural effusion not well visualized due to consolidation. Mild right lower lobe airspace disease unchanged. Electronically Signed   By: Franchot Gallo M.D.   On: 05/10/2020 08:07   DG CHEST PORT 1 VIEW  Result Date: 05/09/2020 CLINICAL DATA:  Shortness of breath. EXAM: PORTABLE CHEST 1 VIEW COMPARISON:  Chest x-ray 05/06/2020 and CT chest 05/05/2020. FINDINGS: Stable left hilar mass with progressive surrounding airspace consolidation and near complete left lower lobe atelectasis. Suspect enlarging left pleural effusion. Stable small right pleural  effusion. The right lung remains relatively clear. IMPRESSION: Stable left hilar mass with progressive surrounding airspace consolidation and near complete left lower lobe/lingular atelectasis. Suspect enlarging left pleural effusion. Electronically Signed   By: Marijo Sanes M.D.   On: 05/09/2020 09:25   DG CHEST PORT 1 VIEW  Result Date: 05/05/2020 CLINICAL DATA:  SOB. Hx of valvular heart disease and HTN EXAM: PORTABLE CHEST 1 VIEW COMPARISON:  05/04/2020 FINDINGS: Normal cardiac silhouette. Dense streaky LEFT lobe opacity increased from prior. Obscuration of the heart border. Small LEFT effusion. RIGHT lung is clear. Lungs are hyperinflated. No pneumothorax IMPRESSION: LEFT lower lobe and lingular pneumonia and probable effusion. Electronically Signed   By: Suzy Bouchard M.D.   On: 05/05/2020 09:04   DG CHEST PORT 1 VIEW  Result Date: 05/04/2020 CLINICAL DATA:  Shortness of breath.  Pleural effusion. EXAM: PORTABLE CHEST 1 VIEW COMPARISON:  May 03, 2020. FINDINGS: Increased size of a large left pleural effusion. Overlying left opacities. Similar versus slightly increased small right pleural effusion. No visible pneumothorax. Cardiac silhouette is largely obscured. No acute osseous abnormality. IMPRESSION: 1. Increased large left pleural effusion. Overlying left  opacities may represent pneumonia and/or atelectasis. 2. Similar versus slightly increased small right pleural effusion. Electronically Signed   By: Margaretha Sheffield MD   On: 05/04/2020 08:10   DG Chest Portable 1 View  Result Date: 05/01/2020 CLINICAL DATA:  Shortness of breath, status post thoracentesis. EXAM: PORTABLE CHEST 1 VIEW COMPARISON:  Chest x-ray 05/01/2020 FINDINGS: The heart size and mediastinal contours are not well visualized due to overlying pleural effusion and pulmonary changes. These airspace opacity of the left lung. No pulmonary edema. Interval decrease in a now small to moderate volume left pleural effusion.  Persistent trace right pleural effusion. No pneumothorax. No acute osseous abnormality. IMPRESSION: 1. Interval decrease in size of a now small to moderate volume left pleural effusion. Underlying infection, inflammation, pulmonary mass not excluded. 2. Trace right pleural effusion. Electronically Signed   By: Iven Finn M.D.   On: 05/01/2020 20:50   ECHOCARDIOGRAM COMPLETE  Result Date: 05/04/2020    ECHOCARDIOGRAM REPORT   Patient Name:   NOU CHARD Date of Exam: 05/04/2020 Medical Rec #:  [id number]          Height:       64.0 in Accession #:    [id number]         Weight:       142.6 lb Date of Birth:  [birth year]           BSA:          1.695 m Patient Age:    33 years           BP:           150/66 mmHg Patient Gender: F                  HR:           88 bpm. Exam Location:  Inpatient Procedure: 2D Echo, Cardiac Doppler and Color Doppler Indications:    Dyspnea  History:        Patient has prior history of Echocardiogram examinations, most                 recent 03/27/2018. Signs/Symptoms:Shortness of Breath; Risk                 Factors:Hypertension, Dyslipidemia and Former Smoker. Large left                 pleural effusion.  Sonographer:    Dustin Flock Referring Phys: [phone] MURALI RAMASWAMY  Sonographer Comments: Technically challenging study due to limited acoustic windows. Difficult windows due to large pleural effusion. IMPRESSIONS  1. Left ventricular ejection fraction, by estimation, is 60 to 65%. The left ventricle has normal function. The left ventricle has no regional wall motion abnormalities. Left ventricular diastolic parameters are consistent with Grade I diastolic dysfunction (impaired relaxation). Elevated left ventricular end-diastolic pressure.  2. Right ventricular systolic function is normal. The right ventricular size is normal.  3. The mitral valve is degenerative. No evidence of mitral valve regurgitation. No evidence of mitral stenosis. Moderate mitral annular  calcification.  4. The aortic valve is normal in structure. Aortic valve regurgitation is not visualized. No aortic stenosis is present.  5. The inferior vena cava is normal in size with greater than 50% respiratory variability, suggesting right atrial pressure of 3 mmHg. FINDINGS  Left Ventricle: Left ventricular ejection fraction, by estimation, is 60 to 65%. The left ventricle has normal function. The left ventricle has no regional wall motion abnormalities. The left  ventricular internal cavity size was normal in size. There is  no left ventricular hypertrophy. Left ventricular diastolic parameters are consistent with Grade I diastolic dysfunction (impaired relaxation). Elevated left ventricular end-diastolic pressure. Right Ventricle: The right ventricular size is normal. No increase in right ventricular wall thickness. Right ventricular systolic function is normal. Left Atrium: Left atrial size was normal in size. Right Atrium: Right atrial size was normal in size. Pericardium: There is no evidence of pericardial effusion. Mitral Valve: The mitral valve is degenerative in appearance. Moderate mitral annular calcification. No evidence of mitral valve regurgitation. No evidence of mitral valve stenosis. Tricuspid Valve: The tricuspid valve is normal in structure. Tricuspid valve regurgitation is not demonstrated. No evidence of tricuspid stenosis. Aortic Valve: The aortic valve is normal in structure. Aortic valve regurgitation is not visualized. No aortic stenosis is present. Pulmonic Valve: The pulmonic valve was normal in structure. Pulmonic valve regurgitation is not visualized. No evidence of pulmonic stenosis. Aorta: The aortic root is normal in size and structure. Venous: The inferior vena cava is normal in size with greater than 50% respiratory variability, suggesting right atrial pressure of 3 mmHg. IAS/Shunts: No atrial level shunt detected by color flow Doppler.  LEFT VENTRICLE PLAX 2D LVIDd:          3.30 cm Diastology LVIDs:         1.60 cm LV e' medial:    4.03 cm/s LV PW:         1.00 cm LV E/e' medial:  19.9 LV IVS:        1.00 cm LV e' lateral:   5.00 cm/s                        LV E/e' lateral: 16.0  RIGHT VENTRICLE RV Basal diam:  2.20 cm RV S prime:     2.50 cm/s TAPSE (M-mode): 2.0 cm LEFT ATRIUM             Index       RIGHT ATRIUM          Index LA Vol (A2C):   26.5 ml 15.64 ml/m RA Area:     8.27 cm LA Vol (A4C):   23.7 ml 13.99 ml/m RA Volume:   15.00 ml 8.85 ml/m LA Biplane Vol: 26.8 ml 15.82 ml/m  AORTIC VALVE LVOT Vmax:   120.00 cm/s LVOT Vmean:  84.000 cm/s LVOT VTI:    0.220 m  AORTA Ao Root diam: 2.60 cm MITRAL VALVE MV Area (PHT): 3.60 cm     SHUNTS MV Decel Time: 211 msec     Systemic VTI: 0.22 m MV E velocity: 80.20 cm/s MV A velocity: 127.00 cm/s MV E/A ratio:  0.63 Skeet Latch MD Electronically signed by Skeet Latch MD Signature Date/Time: 05/04/2020/11:23:20 AM    Final    VAS [place] LOWER EXTREMITY VENOUS (DVT)  Result Date: 05/04/2020  Lower Venous DVT Study Indications: Dyspnea and fatigue x 10 days.  Risk Factors: Recent pleural effusion and thoracentesis w/ suspected malignancy. Performing Technologist: Rogelia Rohrer  Examination Guidelines: A complete evaluation includes B-mode imaging, spectral Doppler, color Doppler, and power Doppler as needed of all accessible portions of each vessel. Bilateral testing is considered an integral part of a complete examination. Limited examinations for reoccurring indications may be performed as noted. The reflux portion of the exam is performed with the patient in reverse Trendelenburg.  +---------+---------------+---------+-----------+----------+--------------+ RIGHT    CompressibilityPhasicitySpontaneityPropertiesThrombus Aging +---------+---------------+---------+-----------+----------+--------------+ CFV  Full           Yes      Yes                                  +---------+---------------+---------+-----------+----------+--------------+ SFJ      Full                                                        +---------+---------------+---------+-----------+----------+--------------+ FV Prox  Full           Yes      Yes                                 +---------+---------------+---------+-----------+----------+--------------+ FV Mid   Full           Yes      Yes                                 +---------+---------------+---------+-----------+----------+--------------+ FV DistalFull           Yes      Yes                                 +---------+---------------+---------+-----------+----------+--------------+ PFV      Full                                                        +---------+---------------+---------+-----------+----------+--------------+ POP      Full           Yes      Yes                                 +---------+---------------+---------+-----------+----------+--------------+ PTV      Full                                                        +---------+---------------+---------+-----------+----------+--------------+ PERO     Full                                                        +---------+---------------+---------+-----------+----------+--------------+   +---------+---------------+---------+-----------+----------+--------------+ LEFT     CompressibilityPhasicitySpontaneityPropertiesThrombus Aging +---------+---------------+---------+-----------+----------+--------------+ CFV      Full           Yes      Yes                                 +---------+---------------+---------+-----------+----------+--------------+ SFJ      Full                                                        +---------+---------------+---------+-----------+----------+--------------+  FV Prox  Full           Yes      Yes                                  +---------+---------------+---------+-----------+----------+--------------+ FV Mid   Full           Yes      Yes                                 +---------+---------------+---------+-----------+----------+--------------+ FV DistalFull           Yes      Yes                                 +---------+---------------+---------+-----------+----------+--------------+ PFV      Full                                                        +---------+---------------+---------+-----------+----------+--------------+ POP      Full           Yes      Yes                                 +---------+---------------+---------+-----------+----------+--------------+ PTV      Full                                                        +---------+---------------+---------+-----------+----------+--------------+ PERO     Full                                                        +---------+---------------+---------+-----------+----------+--------------+     Summary: RIGHT: - There is no evidence of deep vein thrombosis in the lower extremity.  - No cystic structure found in the popliteal fossa.  LEFT: - There is no evidence of deep vein thrombosis in the lower extremity.  - No cystic structure found in the popliteal fossa.  *See table(s) above for measurements and observations. Electronically signed by Monica Martinez MD on 05/04/2020 at 7:55:52 PM.    Final    IR THORACENTESIS ASP PLEURAL SPACE W/IMG GUIDE  Result Date: 05/04/2020 INDICATION: Patient with history of acute hypoxic respiratory failure secondary to recurrent left pleural effusion. Request is made for diagnostic and therapeutic left thoracentesis. EXAM: ULTRASOUND GUIDED DIAGNOSTIC AND THERAPEUTIC THORACENTESIS MEDICATIONS: 10 mL 2% lidocaine COMPLICATIONS: None immediate. PROCEDURE: An ultrasound guided thoracentesis was thoroughly discussed with the patient and questions answered. The benefits, risks, alternatives and  complications were also discussed. The patient understands and wishes to proceed with the procedure. Written consent was obtained. Ultrasound was performed to localize and mark an adequate pocket of fluid in the left chest. The area was then prepped and draped in the  normal sterile fashion. 1% Lidocaine was used for local anesthesia. Under ultrasound guidance a 6 Fr Safe-T-Centesis catheter was introduced. Thoracentesis was performed. The catheter was removed and a dressing applied. FINDINGS: A total of approximately 1.55 L of hazy amber fluid was removed. Samples were sent to the laboratory as requested by the clinical team. IMPRESSION: Successful ultrasound guided left thoracentesis yielding 1.55 L of pleural fluid. Read by: Earley Abide, PA-C Electronically Signed   By: Markus Daft M.D.   On: 05/04/2020 15:12   IR PERC PLEURAL DRAIN W/INDWELL CATH W/IMG GUIDE  Result Date: 05/10/2020 CLINICAL DATA:  Recurrent malignant left pleural effusion and need for tunneled pleural drainage catheter prior to discharge from the hospital. EXAM: INSERTION OF TUNNELED PLEURAL DRAINAGE CATHETER ANESTHESIA/SEDATION: 1.0 mg IV Versed; 50 mcg IV Fentanyl. Total Moderate Sedation Time: 27 minutes. The patient's level of consciousness and physiologic status were continuously monitored during the procedure by Radiology nursing. MEDICATIONS: 2 g IV Ancef. Antibiotic was administered in an appropriate time interval for the procedure. FLUOROSCOPY TIME:  6 seconds.  1.0 mGy. PROCEDURE: The procedure, risks, benefits, and alternatives were explained to the patient. Questions regarding the procedure were encouraged and answered. The patient understands and consents to the procedure. A time-out was performed prior to initiating the procedure. The left chest wall was prepped with chlorhexidine in a sterile fashion, and a sterile drape was applied covering the operative field. A sterile gown and sterile gloves were used for the procedure.  Local anesthesia was provided with 1% Lidocaine. Ultrasound image documentation was performed. Fluoroscopy during the procedure and fluoroscopic spot radiograph confirms appropriate catheter position. After creating a small skin incision, a 19 gauge needle was advanced into the pleural cavity under ultrasound guidance. A guide wire was then advanced under fluoroscopy into the pleural space. Pleural access was dilated serially and a 16-French peel-away sheath placed. A 15.5 French tunneled PleurX catheter was placed. This was tunneled from an incision 5 cm below the pleural access to the access site. The catheter was advanced through the peel-away sheath. The sheath was then removed. Final catheter positioning was confirmed with a fluoroscopic spot image. The access incision was closed with subcutaneous and subcuticular 4-0 Vicryl. Dermabond was applied to the incision. A Prolene retention suture was applied at the catheter exit site. Thoracentesis was then performed via a vacuum bottle. COMPLICATIONS: None. FINDINGS: The catheter was placed via the left lower lateral chest wall. Approximately 1.1 liters of pleural fluid was able to be removed after catheter placement. IMPRESSION: Placement of permanent, tunneled left pleural drainage catheter via lateral approach. 1.1 liters of pleural fluid was removed today after catheter placement. Electronically Signed   By: Aletta Edouard M.D.   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 Valeta Harms 05/22/20, oncology Dr. Julien Nordmann 05/17/20, update CBC/CMP, Mg, Phos, CXR one week   Mass of upper lobe of left lung Hospitalized 05/01/20-05/11/20 CT showed left upper lobe mass invades the mediastinum at the level of the aortic or pulmonary  window, concerning for  lymphangitic spread of disease,  oncology Dr. Julien Nordmann 05/17/20, update CBC/CMP, Mg, Phos, CXR one week   Essential hypertension Blood pressure is controlled, the patient stated her baseline Sbp is 130-140s, likes to reduce Amlodipine since it caused her swelling in legs in the past,  continue Metoprolol, reduce Amlodipine 2.5mg  qd.   Hyperlipidemia Continue Atorvastatin.   Hypokalemia Repeat BMP  Hyponatremia Repeat BMP  Edema Edema BL BNP normal, EF 60-65%, negative DVT venous US. Takes HCTZ   OP (osteoporosis) OP the patient requested to be off Fosamax.    GERD (gastroesophageal reflux disease) GERD, takes Omeprazole, poor appetite, early satiety, f/u oncology eval for mass.   Hypophosphatemia Hypophosphatemia, P 3.0, repeat Mg, Phos    Family/ staff Communication: plan of care reviewed with the patient and charge nurse.   Labs/tests ordered:  none  Time spend 35 minutes.

## 2020-05-15 NOTE — Progress Notes (Signed)
[place] [phone]   [phone]  CONSULT NOTE  REFERRING PHYSICIAN: Dr. Benay Spice  REASON FOR CONSULTATION:  Non-Small Cell Lung Cancer, Adenocarcinoma   HPI Shelley Flores is a 87 y.o. female with a past medical history significant for hypertension, osteoporosis, hyperlipidemia, mild valvular heart disease, and PVCs is referred to the clinic for evaluation of non-small cell lung cancer, adenocarcinoma.  The patient reports a new onset cough since October 2021 as well as the sensation of left abdominal fullness. The patient was placed on Prilosec. The cough persisted and she went on to develop associated shortness of breath. Therefore, on 04/28/20, she had a chest x-ray performed which showed a large left pleural effusion. The patient was supposed to undergo a thoracentesis but presented to the ER on 05/01/20 before this could be scheduled for the chief complaint of shortness of breath. The patient was admitted to the hospital from 05/01/20-05/11/20. CT scan from 05/04/20 showed left upper lobe masslike consolidation consistent with malignancy. A portion of the mass invades the mediastinum at the level of the aortic or pulmonary window. There was prominence of the left upper lobe pulmonary interstitium, concerning for lymphangitic spread of disease. There was subcarinal lymphadenopathy concerning for nodal metastasis. She underwent a thoracentesis on 05/01/20 and 05/04/20 both times yielding approximately 1.5 ml of fluid. The cytology x2 ([id number] and [id number]) was consistent with malignant cells with adenocarcinoma. The patient has a brief smoking history in college for about 4 years where 1 pack of cigarettes would last her a week. 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 The patient had a pleurex catheter placed for her malignancy pleural effusion which is being  drained daily. She estimates that approximately 100-150 of fluid is drained daily. She has a follow with Dr. Valeta Harms next week on 05/22/20 for her pleural effusions.   Prior to her admission, she was in independently living in [facility]. She currently is in the skill nursing portion of the facility undergoing physical therapy.   Today, the patient reports a dry cough, particularly when she talks.  She has tried Mining engineer and Robitussin without significant relief.  She reports her shortness of breath is a lot better since having the pleural fluid drained but still has some degree of shortness of breath.  She denies any fever, chills, night sweats, or unexplained weight loss.  She denies any headache or visual changes.  She still reports feeling abdominal fullness.  She denies any vomiting or recent diarrhea or constipation.  She reports "prenausea".   The patient's family history consists of a mother who had Hodgkin's lymphoma.  The patient's father had esophageal cancer secondary to alcohol and tobacco use.  The patient does not have any siblings.  The patient is single does not have any children.  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 She states she was exposed to hazardous chemicals when she was in graduate school.  She states that she had a very brief smoking history for 4 years in college which she estimates she smoked approximately 1 pack of cigarettes per week.  She denies drug use.  The patient states that she has not drank alcohol in several years and denies heavy alcohol use.   HPI  Past Medical History:  Diagnosis Date  . Bruit    Abdominal bruit - Abdominal aorta/renal duplex Doppler evaluation 12/07/03 -Mildly  abnormal evaluation. 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 This is consistant w/median arcuate ligament compression syndrome. *Right & Left Kidney: Essentially equal in size, symmetrical in shape w/no significant  abnormalities visualized. *Right & Left Renal Arteries: No significant abnormalities.  . Edema extremities    LE edema   . H/O myocardial perfusion scan 02/20/00   To rule out ischemia - Negative adequate Bruce protocol exercise stress test with a deconditioned exercise response and normal static myocardial perfusion images with EF calculated by QGS of 77%. Represents a low risk study.  . Hyperlipidemia   . Hypertension   . Osteoarthritis   . Osteoporosis   . Pleural effusion 04/2020  . Valvular heart disease    Mild valvular heart disease by Echo in 2010, all mild and symptomatic, including concentric LVH, MR, TR, and AI with pulmonary artery pressure of 32. EF was normal.    Past Surgical History:  Procedure Laterality Date  . Abdominal aorta/Renal duplex Doppler Evaluation  12/07/03   For abdominal bruit. Mildly abnormal evaluation. (See bruit in medical history)  . BREAST EXCISIONAL BIOPSY Left 1966  . BREAST EXCISIONAL BIOPSY Left 1999  . CATARACT EXTRACTION  2003  . COLONOSCOPY  10/22/2004  . DEXA Bone Scan  06/23/2013  . IR PERC PLEURAL DRAIN W/INDWELL CATH W/IMG GUIDE  05/10/2020  . IR THORACENTESIS ASP PLEURAL SPACE W/IMG GUIDE  05/04/2020    Family History  Problem Relation Age of Onset  . Leukemia Mother 85  . Cancer Father        esophagial cancer  . Heart failure Maternal Grandmother   . Tuberculosis Maternal Grandfather   . Heart disease Paternal Grandmother   . Cancer Paternal Grandfather     Social History Social History   Tobacco Use  . Smoking status: Former Smoker    Packs/day: 0.25    Years: 6.00    Pack years: 1.50    Types: Cigarettes    Quit date: 05/13/1962    Years since quitting: 58.0  . Smokeless tobacco: Never Used  Vaping Use  . Vaping Use: Never used  Substance Use Topics  . Alcohol use: Yes    Alcohol/week: 2.0 standard drinks    Types: 2 Standard drinks or equivalent per week    Comment: eine  . Drug use: No    Allergies   Allergen Reactions  . Irbesartan Nausea Only    Current Outpatient Medications  Medication Sig Dispense Refill  . acetaminophen (TYLENOL) 325 MG tablet Take 2 tablets (650 mg total) by mouth every 6 (six) hours as needed for mild pain (or Fever >/= 101). 20 tablet 0  . aspirin 81 MG tablet Take 81 mg by mouth daily.    [name] atorvastatin (LIPITOR) 20 MG tablet TAKE 1 TABLET BY MOUTH EVERY DAY (Patient taking differently: Take 20 mg by mouth at bedtime.) 90 tablet 1  . hydrochlorothiazide (HYDRODIURIL) 12.5 MG tablet Take 12.5 mg by mouth daily.    . metoprolol succinate (TOPROL-XL) 50 MG 24 hr tablet Take 50 mg by mouth daily. Take with or immediately following a meal.    . Multiple Vitamin (MULTIVITAMIN) tablet Take 1 tablet by mouth daily.    [name] omeprazole (PRILOSEC) 20 MG capsule Take 20 mg by mouth 2 (two) times daily.    [name] zinc oxide 20 % ointment Apply 1 application topically as needed for irritation. apply to buttocks/peri, topical, As Needed, To buttocks after every incontinent episode and as needed for redness. May keep  at bedside.    . furosemide (LASIX) 20 MG tablet Take 20 mg by mouth daily. (Patient not taking: Reported on 05/17/2020)     No current facility-administered medications for this visit.    REVIEW OF SYSTEMS:   Review of Systems  Constitutional: Negative for appetite change, chills, fatigue, fever and unexpected weight change.  HENT: Negative for mouth sores, nosebleeds, sore throat and trouble swallowing.   Eyes: Negative for eye problems and icterus.  Respiratory: Positive for dry cough and shortness of breath. Negative for  hemoptysis and wheezing.   Cardiovascular: Positive for bilateral leg swelling. Negative for chest pain. Gastrointestinal: Positive for mild nausea and abdominal fullness. Negative for abdominal pain, constipation, diarrhea, and vomiting.  Genitourinary: Negative for bladder incontinence, difficulty urinating, dysuria, frequency and hematuria.    Musculoskeletal: Negative for back pain, gait problem, neck pain and neck stiffness.  Skin: Negative for itching and rash.  Neurological: Negative for dizziness, extremity weakness, gait problem, headaches, light-headedness and seizures.  Hematological: Negative for adenopathy. Does not bruise/bleed easily.  Psychiatric/Behavioral: Negative for confusion, depression and sleep disturbance. The patient is not nervous/anxious.     PHYSICAL EXAMINATION:  Blood pressure 135/70, pulse 90, temperature 98.7 F (37.1 C), temperature source Tympanic, resp. rate 18, height '5\' 4"'  (1.626 m), weight 129 lb (58.5 kg), SpO2 97 %.  ECOG PERFORMANCE STATUS: 1  Physical Exam  Constitutional: Oriented to person, place, and time and well-developed, well-nourished, and in no distress.  HENT:  Head: Normocephalic and atraumatic.  Mouth/Throat: Oropharynx is clear and moist. No oropharyngeal exudate.  Eyes: Conjunctivae are normal. Right eye exhibits no discharge. Left eye exhibits no discharge. No scleral icterus.  Neck: Normal range of motion. Neck supple.  Cardiovascular: Normal rate, regular rhythm, normal heart sounds and intact distal pulses.   Pulmonary/Chest: Effort normal. Decreased breath sounds in the left lower lung field. No respiratory distress. No wheezes. No rales.  Abdominal: Soft. Bowel sounds are normal. Exhibits no distension and no mass. There is no tenderness.  Musculoskeletal: Bilateral lower extremity edema. Normal range of motion.  Lymphadenopathy:    No cervical adenopathy.  Neurological: Alert and oriented to person, place, and time. Exhibits normal muscle tone. The patient was examined in the wheelchair.  Skin: Skin is warm and dry. No rash noted. Not diaphoretic. No erythema. No pallor.  Psychiatric: Mood, memory and judgment normal.  Vitals reviewed.  LABORATORY DATA: Lab Results  Component Value Date   WBC 13.2 (H) 05/17/2020   HGB 15.2 (H) 05/17/2020   HCT 44.9  05/17/2020   MCV 88.4 05/17/2020   PLT 370 05/17/2020      Chemistry      Component Value Date/Time   NA 136 05/17/2020 1300   NA 138 04/14/2020 0829   K 3.4 (L) 05/17/2020 1300   CL 99 05/17/2020 1300   CO2 26 05/17/2020 1300   BUN 16 05/17/2020 1300   BUN 12 04/14/2020 0829   CREATININE 0.90 05/17/2020 1300      Component Value Date/Time   CALCIUM 10.3 05/17/2020 1300   ALKPHOS 76 05/17/2020 1300   AST 27 05/17/2020 1300   ALT 21 05/17/2020 1300   BILITOT 0.7 05/17/2020 1300       RADIOGRAPHIC STUDIES: DG Chest 1 View  Result Date: 05/06/2020 CLINICAL DATA:  Shortness of breath. EXAM: CHEST  1 VIEW COMPARISON:  05/05/2020 FINDINGS: 0926 hours. Interval progression of left parahilar and retrocardiac basilar collapse/consolidative opacity. Left pleural effusion noted. There is probable some  minimal atelectasis at the right base with tiny right pleural effusion. IMPRESSION: 1. Interval progression of left parahilar and retrocardiac basilar collapse/consolidative opacity. 2. Left pleural effusion. 3. Similar appearance of minimal atelectasis/infiltrate at the right base with tiny right effusion. Electronically Signed   By: Misty Stanley M.D.   On: 05/06/2020 09:33   DG Chest 1 View  Result Date: 05/04/2020 CLINICAL DATA:  Post thoracentesis on the left. EXAM: CHEST  1 VIEW COMPARISON:  Radiographs earlier today and 05/03/2020. CT 05/02/2020. FINDINGS: 1416 hours. Interval decreased size of left pleural effusion following thoracentesis. There is improved aeration of the left lung with persistent left perihilar opacity which may reflect a mass or infiltrate. No pneumothorax. The right lung remains clear. There is a stable small right pleural effusion. The heart size and mediastinal contours are stable. IMPRESSION: 1. Interval decreased size of left pleural effusion following thoracentesis with improved aeration of the left lung. No pneumothorax. 2. Persistent left perihilar opacity  may reflect a mass or infiltrate. Electronically Signed   By: Richardean Sale M.D.   On: 05/04/2020 14:49   DG Chest 2 View  Result Date: 05/03/2020 CLINICAL DATA:  Shortness of breath, pleural effusion EXAM: CHEST - 2 VIEW COMPARISON:  CT chest, 05/02/2020, chest radiographs, 05/01/2020 FINDINGS: Large left, small right pleural effusions with associated atelectasis or consolidation are not significantly changed compared to prior examination. No new airspace opacity. The cardiac borders are largely obscured. Osseous structures are unremarkable. IMPRESSION: Large left, small right pleural effusions with associated atelectasis or consolidation are not significantly changed compared to prior examination. No new airspace opacity. Electronically Signed   By: Eddie Candle M.D.   On: 05/03/2020 08:22   DG Chest 2 View  Result Date: 05/01/2020 CLINICAL DATA:  Shortness of breath for 5 days, diagnosed with a LEFT pleural effusion on Friday, history hypertension, former smoker EXAM: CHEST - 2 VIEW COMPARISON:  04/28/2020 FINDINGS: Subtotal opacification of the LEFT hemithorax by large LEFT pleural effusion. Significant atelectasis of LEFT lung and mediastinal shift LEFT to RIGHT. Small RIGHT pleural effusion identified. Heart size poorly seen. No pneumothorax or RIGHT lung consolidation identified. Bones demineralized. IMPRESSION: Large LEFT pleural effusion with subtotal atelectasis of LEFT lung and mediastinal shift LEFT to RIGHT little changed since 04/28/2020. Electronically Signed   By: Lavonia Dana M.D.   On: 05/01/2020 16:54   DG Chest 2 View  Result Date: 04/28/2020 CLINICAL DATA:  Chronic cough and shortness of breath EXAM: CHEST - 2 VIEW COMPARISON:  April 21, 2020 FINDINGS: There is a large left pleural effusion. There may well be a degree of underlying atelectasis and/or consolidation on the left. There is a minimal right pleural effusion. Right lung otherwise clear. Heart size is normal.  Pulmonary vascular on the right is normal. There is distortion of pulmonary vascular on the left due to the large effusion. No adenopathy appreciable. No bone lesions. IMPRESSION: Large left pleural effusion occupying much of the left hemithorax, similar to recent prior study. Question underlying atelectasis and/or consolidation on the left. Right lung clear except for minimal right pleural effusion. Heart size normal. These results will be called to the ordering clinician or representative by the Radiologist Assistant, and communication documented in the PACS or [employer]. Electronically Signed   By: Lowella Grip III M.D.   On: 04/28/2020 12:51   CT CHEST W CONTRAST  Result Date: 05/04/2020 CLINICAL DATA:  Pleural effusion, left thoracentesis, abnormal chest x-ray EXAM: CT CHEST WITH CONTRAST  TECHNIQUE: Multidetector CT imaging of the chest was performed during intravenous contrast administration. CONTRAST:  109m OMNIPAQUE IOHEXOL 300 MG/ML  SOLN COMPARISON:  05/02/2020 FINDINGS: Cardiovascular: The heart is unremarkable without pericardial effusion. There is normal caliber of the thoracic aorta with no aneurysm or dissection. Minimal atherosclerosis. No evidence of pulmonary embolus. There is extrinsic compression of the left upper lobe pulmonary artery. Mediastinum/Nodes: Subcarinal adenopathy measuring 14 mm in short axis. Thyroid, trachea, and esophagus are unremarkable. Lungs/Pleura: There is masslike consolidation of the left upper lobe abutting the mediastinum, measuring 4.5 x 4.1 x 4.6 cm (see image 61/3 and image 59/5). Medial extent of this mass invades the mediastinum at the level of the aortic or pulmonary window, reference image 57/3 and 59/5. Findings are consistent with underlying malignancy and postobstructive change. PET CT may better delineate the mass from the postobstructive change. Bronchoscopy may be useful for tissue diagnosis if recent thoracentesis was nondiagnostic for  malignancy. There are small bilateral pleural effusions. On the left, there is a small loculated component in the anterior left hemithorax adjacent to the left upper lobe mass described above. Scattered hypoventilatory changes are seen at the lung bases and within the lingula. Interstitial prominence within the left upper lobe could reflect lymphangitic spread of disease. Upper Abdomen: Complex left renal cysts are unchanged. No acute upper abdominal process. Musculoskeletal: No acute or destructive bony lesions. Reconstructed images demonstrate no additional findings. IMPRESSION: 1. Left upper lobe masslike consolidation consistent with malignancy. A portion of the mass invades the mediastinum at the level of the aortic or pulmonary window. PET-CT may be useful to delineate the mass from the postobstructive change. 2. Prominence of the left upper lobe pulmonary interstitium, concerning for lymphangitic spread of disease. 3. A subcarinal lymphadenopathy worrisome for nodal metastasis. Again, PET-CT may be useful. 4. Small bilateral pleural effusions, partially loculated on the left. Decreased left effusion after recent thoracentesis. 5.  Aortic Atherosclerosis (ICD10-I70.0). Electronically Signed   By: MRanda NgoM.D.   On: 05/04/2020 23:09   CT ABDOMEN PELVIS W CONTRAST  Result Date: 05/05/2020 CLINICAL DATA:  Urologic cancer surveillance EXAM: CT ABDOMEN AND PELVIS WITH CONTRAST TECHNIQUE: Multidetector CT imaging of the abdomen and pelvis was performed using the standard protocol following bolus administration of intravenous contrast. CONTRAST:  717mOMNIPAQUE IOHEXOL 300 MG/ML  SOLN COMPARISON:  CT chest, 05/04/2020, CT chest abdomen pelvis, 05/02/2020 FINDINGS: Lower chest: Small, left greater than right bilateral pleural effusions, similar to prior examination. Nodular thickening of the dependent left-sided parietal pleura (series 3, image 11). Hepatobiliary: Multiple subcentimeter fluid and  low-attenuation lesions of the liver, not significant changed compared to prior examination, and most likely benign incidental cysts and/or hemangiomata. No gallstones, gallbladder wall thickening, or biliary dilatation. Pancreas: There is an unchanged 1.1 cm fluid low-attenuation lesion of the pancreatic tail (series 3, image 26). No pancreatic ductal dilatation or surrounding inflammatory changes. Spleen: Normal in size without significant abnormality. Adrenals/Urinary Tract: Adrenal glands are unremarkable. There are multiple bilateral simple renal cysts, the largest in the midportion of the left kidney and inferior pole of the right kidney. Kidneys are otherwise normal, without renal calculi, solid lesion, or hydronephrosis. Bladder is unremarkable. Stomach/Bowel: Stomach is within normal limits. Appendix is not clearly visualized and may be diminutive or surgically absent. There is redemonstrated mild rectal wall thickening and minimal perirectal fat stranding (series 3, image 69). Vascular/Lymphatic: Aortic atherosclerosis. No enlarged abdominal or pelvic lymph nodes. Reproductive: Status post hysterectomy. Other: No abdominal wall hernia  or abnormality. No abdominopelvic ascites. Musculoskeletal: No acute or significant osseous findings. IMPRESSION: 1. No definite evidence of malignancy or metastatic disease in the abdomen or pelvis. Examination is generally unchanged compared to recent CT dated 05/02/2020. 2. No evidence of urologic malignancy per ordering indication. Definitively benign bilateral renal cysts for which no further characterization or follow-up is specifically required. 3. Multiple subcentimeter low-attenuation lesions of the liver, generally too small to confidently characterize, although the largest are clearly simple cysts, remaining smaller lesions almost certainly incidental small cysts or hemangiomata. No findings suspicious for hepatic metastatic disease. 4. There is redemonstrated mild  rectal wall thickening and minimal perirectal fat stranding, again suggesting nonspecific proctitis. Correlate with referable signs and symptoms if present. Synchronous rectal malignancy would be difficult to strictly exclude. Consider digital and/or endoscopic examination. 5. There is an unchanged 1.1 cm fluid low-attenuation lesion of the pancreatic tail. 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 There is no pancreatic ductal dilatation. Given new diagnosis of advanced stage lung malignancy, recommend attention on follow-up with future clinically indicated oncology imaging. 6. Small, left greater than right bilateral pleural effusions, similar to prior examination. Nodular thickening of the dependent left-sided parietal pleura. 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 Aortic Atherosclerosis (ICD10-I70.0). Electronically Signed   By: Eddie Candle M.D.   On: 05/05/2020 19:22   CT CHEST ABDOMEN PELVIS W CONTRAST  Addendum Date: 05/02/2020   ADDENDUM REPORT: 05/02/2020 04:13 ADDENDUM: These results were called by telephone at the time of interpretation on 05/02/2020 at 4:13 am to provider [facility] , who verbally acknowledged these results. Electronically Signed   By: Lovena Le M.D.   On: 05/02/2020 04:13   Result Date: 05/02/2020 CLINICAL DATA:  Pleural effusion, malignancy suspected EXAM: CT CHEST, ABDOMEN, AND PELVIS WITH CONTRAST TECHNIQUE: Multidetector CT imaging of the chest, abdomen and pelvis was performed following the standard protocol during bolus administration of intravenous contrast. CONTRAST:  169m OMNIPAQUE IOHEXOL 300 MG/ML  SOLN COMPARISON:  Radiograph 05/02/2019 FINDINGS: CT CHEST FINDINGS Cardiovascular: The aortic root is suboptimally assessed given cardiac pulsation artifact. Atherosclerotic plaque within the normal caliber aorta. No acute luminal abnormality of the imaged aorta. No periaortic stranding or hemorrhage. Normal 3  vessel branching of the aortic arch. Proximal great vessels are mildly calcified but otherwise unremarkable. Cardiac size is within normal limits. Trace pericardial effusion. Coronary artery calcifications. Central pulmonary arteries are normal caliber. No large central filling defects are present with more distal evaluation limited on this non tailored examination of the pulmonary arteries. Mediastinum/Nodes: Scattered conspicuous mediastinal nodes including several enlarged nodes towards the cardiac apex measuring up to 10 mm short axis (3/46) additional posterior mediastinal nodes are present including a left periaortic node measuring 8 mm (3/43 and several subpleural nodules, likely small lymph nodes (3/36) subcarinal lymph node measures up to 16 mm (3/26). Lungs/Pleura: Moderate left and small right pleural effusions. The left pleural effusion demonstrates areas of conspicuous pleural thickening (3/34) and small pleural nodules (3/23). While there are adjacent areas of passive atelectatic change heterogeneous enhancement of the atelectatic lung parenchyma particularly within collapsed portions of lingula, anterior segment left upper lobe and anterior basal segment of the left lower raise concern for underlying airspace disease or malignancy. Aerated portions of the left upper and lower lobe demonstrate additional mixed ground-glass and consolidative opacity worrisome for are further airspace disease. Relative sparing in the aerated portions of the right lung. Musculoskeletal: There is asymmetric soft tissue edema  across the left chest wall. No clear extension of the pleural or airspace processes in to the chest wall proper. No acute or worrisome osseous lesions. Heterogeneously dense breast tissue without focal breast nodularity or masses. Few benign macro calcifications. Multilevel degenerative changes in the spine and shoulders. Dextrocurvature of the thoracic spine. CT ABDOMEN PELVIS FINDINGS Hepatobiliary:  Few scattered subcentimeter hypoattenuating foci are present in the liver, largest in the left lobe measuring up to 8 mm (3/51). Too small to fully characterize on CT imaging. No other focal concerning liver lesions. Gallbladder is unremarkable. No pericholecystic fluid or inflammation. No visible intraductal gallstones or biliary dilatation. Pancreas: No pancreatic ductal dilatation or surrounding inflammatory changes. Spleen: Few punctate hypoattenuating foci in the spleen are too small to characterize. No worrisome focal splenic lesions. Normal splenic size. Adrenals/Urinary Tract: No concerning adrenal nodules or masses. Multiple fluid attenuation cysts are present in both kidneys, largest are present in the upper pole left kidney measuring up to 7.4 cm with some thin peripheral mural calcification (Bosniak 2). No concerning focal renal lesions. No urolithiasis or hydronephrosis. Urinary bladder is largely decompressed at the time of exam and therefore poorly evaluated by CT imaging. No gross bladder abnormality. Stomach/Bowel: Mild thickening at the gastric antrum is likely related to normal peristaltic motion. Duodenum is unremarkable. High attenuation enteric contrast material partially traverses the colon. No small bowel thickening or dilatation. No proximal colonic abnormalities are identified. Some questionable thickening of the rectosigmoid though may be related to underdistention. However some perirectal fat stranding is present which is nonspecific. No evidence of obstruction. Appendix is not visualized. No focal inflammation the vicinity of the cecum to suggest an occult appendicitis. Vascular/Lymphatic: Atherosclerotic calcifications within the abdominal aorta and branch vessels. No aneurysm or ectasia. No enlarged abdominopelvic lymph nodes. Reproductive: Patient appears to be post hysterectomy. No concerning adnexal lesion. Other: Mild presacral fat stranding and trace fluid in the pelvis, possibly  reactive fluid status or reactive change. No free air. Mild body wall edema most pronounced over the left flank and hip. No bowel containing hernias. Musculoskeletal: Multilevel degenerative changes are present in the imaged portions of the spine. Stepwise retrolisthesis L1-L4. Levocurvature of the lumbar spine. No spondylolysis. Additional degenerative changes in the hips and pelvis. No acute osseous abnormality or suspicious osseous lesion. Sclerotic focus in the right ilium (6/53) is likely bone island. IMPRESSION: 1. Moderate left and small right pleural effusions. The left pleural effusion demonstrates areas of conspicuous pleural thickening and small pleural nodularity. While this could reflect empyema, a malignant effusion with pleural deposits would present similarly. Furthermore, there is heterogeneous enhancement of the adjacent atelectatic lung parenchyma including within the lingula, anterior segment left upper lobe and anterior basal segment of the left lower lobe. Could reflect underlying airspace disease or malignancy with additional airspace disease seen throughout aerated portions of the left lung. 2. Asymmetric soft tissue edema across the left chest wall. No clear extension of the pleural or airspace processes in to the chest wall proper. Possibly reactive though should with exam findings and continued attention on follow-up imaging is recommended. 3. Some questionable thickening of the rectosigmoid though may be related to underdistention though some mild perirectal fat stranding is present however and could suggest an underlying proctitis. 4. Aortic Atherosclerosis (ICD10-I70.0). Currently attempting to contact the ordering provider with a critical value result. Addendum will be submitted upon case discussion. Electronically Signed: By: Lovena Le M.D. On: 05/02/2020 04:04   DG CHEST PORT 1 VIEW  Result Date: 05/11/2020 CLINICAL DATA:  Shortness of breath with cough EXAM: PORTABLE CHEST 1  VIEW COMPARISON:  May 10, 2020 FINDINGS: Left drainage catheter present medially with interval drainage of most of the pleural effusion compared to 1 day prior. Small amount of residual effusion remains with atelectatic change in the left mid lower lung regions. Apparent consolidation abutting the left hilum is present. On the right, there is a small pleural effusion with slight right base atelectasis. Heart is upper normal in size with pulmonary vascularity normal. No adenopathy no bone lesions. IMPRESSION: Most of pleural fluid drain on the left following drainage catheter placement. No pneumothorax. Consolidation overlying the left hilum/perihilar region again noted. Stable cardiac silhouette. Small right pleural effusion with mild right base atelectasis. Right lung otherwise clear. Electronically Signed   By: Lowella Grip III M.D.   On: 05/11/2020 08:04   DG CHEST PORT 1 VIEW  Result Date: 05/10/2020 CLINICAL DATA:  Short of breath EXAM: PORTABLE CHEST 1 VIEW COMPARISON:  05/09/2020 FINDINGS: Extensive consolidation left lower lobe with mild progression, with associated pleural effusion. No pneumothorax Mild right lower lobe airspace disease and small right effusion unchanged. Negative for heart failure or edema. IMPRESSION: Extensive consolidation left lower lobe with mild progression. Left pleural effusion not well visualized due to consolidation. Mild right lower lobe airspace disease unchanged. Electronically Signed   By: Franchot Gallo M.D.   On: 05/10/2020 08:07   DG CHEST PORT 1 VIEW  Result Date: 05/09/2020 CLINICAL DATA:  Shortness of breath. EXAM: PORTABLE CHEST 1 VIEW COMPARISON:  Chest x-ray 05/06/2020 and CT chest 05/05/2020. FINDINGS: Stable left hilar mass with progressive surrounding airspace consolidation and near complete left lower lobe atelectasis. Suspect enlarging left pleural effusion. Stable small right pleural effusion. The right lung remains relatively clear.  IMPRESSION: Stable left hilar mass with progressive surrounding airspace consolidation and near complete left lower lobe/lingular atelectasis. Suspect enlarging left pleural effusion. Electronically Signed   By: Marijo Sanes M.D.   On: 05/09/2020 09:25   DG CHEST PORT 1 VIEW  Result Date: 05/05/2020 CLINICAL DATA:  SOB. Hx of valvular heart disease and HTN EXAM: PORTABLE CHEST 1 VIEW COMPARISON:  05/04/2020 FINDINGS: Normal cardiac silhouette. Dense streaky LEFT lobe opacity increased from prior. Obscuration of the heart border. Small LEFT effusion. RIGHT lung is clear. Lungs are hyperinflated. No pneumothorax IMPRESSION: LEFT lower lobe and lingular pneumonia and probable effusion. Electronically Signed   By: Suzy Bouchard M.D.   On: 05/05/2020 09:04   DG CHEST PORT 1 VIEW  Result Date: 05/04/2020 CLINICAL DATA:  Shortness of breath.  Pleural effusion. EXAM: PORTABLE CHEST 1 VIEW COMPARISON:  May 03, 2020. FINDINGS: Increased size of a large left pleural effusion. Overlying left opacities. Similar versus slightly increased small right pleural effusion. No visible pneumothorax. Cardiac silhouette is largely obscured. No acute osseous abnormality. IMPRESSION: 1. Increased large left pleural effusion. Overlying left opacities may represent pneumonia and/or atelectasis. 2. Similar versus slightly increased small right pleural effusion. Electronically Signed   By: Margaretha Sheffield MD   On: 05/04/2020 08:10   DG Chest Portable 1 View  Result Date: 05/01/2020 CLINICAL DATA:  Shortness of breath, status post thoracentesis. EXAM: PORTABLE CHEST 1 VIEW COMPARISON:  Chest x-ray 05/01/2020 FINDINGS: The heart size and mediastinal contours are not well visualized due to overlying pleural effusion and pulmonary changes. These airspace opacity of the left lung. No pulmonary edema. Interval decrease in a now small to moderate volume left pleural effusion.  Persistent trace right pleural effusion. No  pneumothorax. No acute osseous abnormality. IMPRESSION: 1. Interval decrease in size of a now small to moderate volume left pleural effusion. Underlying infection, inflammation, pulmonary mass not excluded. 2. Trace right pleural effusion. Electronically Signed   By: Iven Finn M.D.   On: 05/01/2020 20:50   ECHOCARDIOGRAM COMPLETE  Result Date: 05/04/2020    ECHOCARDIOGRAM REPORT   Patient Name:   Shelley Flores Date of Exam: 05/04/2020 Medical Rec #:  [id number]          Height:       64.0 in Accession #:    [id number]         Weight:       142.6 lb Date of Birth:  1933-03-19           BSA:          1.695 m Patient Age:    40 years           BP:           150/66 mmHg Patient Gender: F                  HR:           88 bpm. Exam Location:  Inpatient Procedure: 2D Echo, Cardiac Doppler and Color Doppler Indications:    Dyspnea  History:        Patient has prior history of Echocardiogram examinations, most                 recent 03/27/2018. Signs/Symptoms:Shortness of Breath; Risk                 Factors:Hypertension, Dyslipidemia and Former Smoker. Large left                 pleural effusion.  Sonographer:    Dustin Flock Referring Phys: [phone] MURALI RAMASWAMY  Sonographer Comments: Technically challenging study due to limited acoustic windows. Difficult windows due to large pleural effusion. IMPRESSIONS  1. Left ventricular ejection fraction, by estimation, is 60 to 65%. The left ventricle has normal function. The left ventricle has no regional wall motion abnormalities. Left ventricular diastolic parameters are consistent with Grade I diastolic dysfunction (impaired relaxation). Elevated left ventricular end-diastolic pressure.  2. Right ventricular systolic function is normal. The right ventricular size is normal.  3. The mitral valve is degenerative. No evidence of mitral valve regurgitation. No evidence of mitral stenosis. Moderate mitral annular calcification.  4. The aortic valve is normal in  structure. Aortic valve regurgitation is not visualized. No aortic stenosis is present.  5. The inferior vena cava is normal in size with greater than 50% respiratory variability, suggesting right atrial pressure of 3 mmHg. FINDINGS  Left Ventricle: Left ventricular ejection fraction, by estimation, is 60 to 65%. The left ventricle has normal function. The left ventricle has no regional wall motion abnormalities. The left ventricular internal cavity size was normal in size. There is  no left ventricular hypertrophy. Left ventricular diastolic parameters are consistent with Grade I diastolic dysfunction (impaired relaxation). Elevated left ventricular end-diastolic pressure. Right Ventricle: The right ventricular size is normal. No increase in right ventricular wall thickness. Right ventricular systolic function is normal. Left Atrium: Left atrial size was normal in size. Right Atrium: Right atrial size was normal in size. Pericardium: There is no evidence of pericardial effusion. Mitral Valve: The mitral valve is degenerative in appearance. Moderate mitral annular calcification. No evidence of mitral valve regurgitation. No  evidence of mitral valve stenosis. Tricuspid Valve: The tricuspid valve is normal in structure. Tricuspid valve regurgitation is not demonstrated. No evidence of tricuspid stenosis. Aortic Valve: The aortic valve is normal in structure. Aortic valve regurgitation is not visualized. No aortic stenosis is present. Pulmonic Valve: The pulmonic valve was normal in structure. Pulmonic valve regurgitation is not visualized. No evidence of pulmonic stenosis. Aorta: The aortic root is normal in size and structure. Venous: The inferior vena cava is normal in size with greater than 50% respiratory variability, suggesting right atrial pressure of 3 mmHg. IAS/Shunts: No atrial level shunt detected by color flow Doppler.  LEFT VENTRICLE PLAX 2D LVIDd:         3.30 cm Diastology LVIDs:         1.60 cm LV e'  medial:    4.03 cm/s LV PW:         1.00 cm LV E/e' medial:  19.9 LV IVS:        1.00 cm LV e' lateral:   5.00 cm/s                        LV E/e' lateral: 16.0  RIGHT VENTRICLE RV Basal diam:  2.20 cm RV S prime:     2.50 cm/s TAPSE (M-mode): 2.0 cm LEFT ATRIUM             Index       RIGHT ATRIUM          Index LA Vol (A2C):   26.5 ml 15.64 ml/m RA Area:     8.27 cm LA Vol (A4C):   23.7 ml 13.99 ml/m RA Volume:   15.00 ml 8.85 ml/m LA Biplane Vol: 26.8 ml 15.82 ml/m  AORTIC VALVE LVOT Vmax:   120.00 cm/s LVOT Vmean:  84.000 cm/s LVOT VTI:    0.220 m  AORTA Ao Root diam: 2.60 cm MITRAL VALVE MV Area (PHT): 3.60 cm     SHUNTS MV Decel Time: 211 msec     Systemic VTI: 0.22 m MV E velocity: 80.20 cm/s MV A velocity: 127.00 cm/s MV E/A ratio:  0.63 Skeet Latch MD Electronically signed by Skeet Latch MD Signature Date/Time: 05/04/2020/11:23:20 AM    Final    VAS [place] LOWER EXTREMITY VENOUS (DVT)  Result Date: 05/04/2020  Lower Venous DVT Study Indications: Dyspnea and fatigue x 10 days.  Risk Factors: Recent pleural effusion and thoracentesis w/ suspected malignancy. Performing Technologist: Rogelia Rohrer  Examination Guidelines: A complete evaluation includes B-mode imaging, spectral Doppler, color Doppler, and power Doppler as needed of all accessible portions of each vessel. Bilateral testing is considered an integral part of a complete examination. Limited examinations for reoccurring indications may be performed as noted. The reflux portion of the exam is performed with the patient in reverse Trendelenburg.  +---------+---------------+---------+-----------+----------+--------------+ RIGHT    CompressibilityPhasicitySpontaneityPropertiesThrombus Aging +---------+---------------+---------+-----------+----------+--------------+ CFV      Full           Yes      Yes                                 +---------+---------------+---------+-----------+----------+--------------+ SFJ      Full                                                         +---------+---------------+---------+-----------+----------+--------------+  FV Prox  Full           Yes      Yes                                 +---------+---------------+---------+-----------+----------+--------------+ FV Mid   Full           Yes      Yes                                 +---------+---------------+---------+-----------+----------+--------------+ FV DistalFull           Yes      Yes                                 +---------+---------------+---------+-----------+----------+--------------+ PFV      Full                                                        +---------+---------------+---------+-----------+----------+--------------+ POP      Full           Yes      Yes                                 +---------+---------------+---------+-----------+----------+--------------+ PTV      Full                                                        +---------+---------------+---------+-----------+----------+--------------+ PERO     Full                                                        +---------+---------------+---------+-----------+----------+--------------+   +---------+---------------+---------+-----------+----------+--------------+ LEFT     CompressibilityPhasicitySpontaneityPropertiesThrombus Aging +---------+---------------+---------+-----------+----------+--------------+ CFV      Full           Yes      Yes                                 +---------+---------------+---------+-----------+----------+--------------+ SFJ      Full                                                        +---------+---------------+---------+-----------+----------+--------------+ FV Prox  Full           Yes      Yes                                 +---------+---------------+---------+-----------+----------+--------------+ FV Mid   Full  Yes      Yes                                  +---------+---------------+---------+-----------+----------+--------------+ FV DistalFull           Yes      Yes                                 +---------+---------------+---------+-----------+----------+--------------+ PFV      Full                                                        +---------+---------------+---------+-----------+----------+--------------+ POP      Full           Yes      Yes                                 +---------+---------------+---------+-----------+----------+--------------+ PTV      Full                                                        +---------+---------------+---------+-----------+----------+--------------+ PERO     Full                                                        +---------+---------------+---------+-----------+----------+--------------+     Summary: RIGHT: - There is no evidence of deep vein thrombosis in the lower extremity.  - No cystic structure found in the popliteal fossa.  LEFT: - There is no evidence of deep vein thrombosis in the lower extremity.  - No cystic structure found in the popliteal fossa.  *See table(s) above for measurements and observations. Electronically signed by Monica Martinez MD on 05/04/2020 at 7:55:52 PM.    Final    IR THORACENTESIS ASP PLEURAL SPACE W/IMG GUIDE  Result Date: 05/04/2020 INDICATION: Patient with history of acute hypoxic respiratory failure secondary to recurrent left pleural effusion. Request is made for diagnostic and therapeutic left thoracentesis. EXAM: ULTRASOUND GUIDED DIAGNOSTIC AND THERAPEUTIC THORACENTESIS MEDICATIONS: 10 mL 2% lidocaine COMPLICATIONS: None immediate. PROCEDURE: An ultrasound guided thoracentesis was thoroughly discussed with the patient and questions answered. The benefits, risks, alternatives and complications were also discussed. The patient understands and wishes to proceed with the procedure. Written consent was obtained. Ultrasound was  performed to localize and mark an adequate pocket of fluid in the left chest. The area was then prepped and draped in the normal sterile fashion. 1% Lidocaine was used for local anesthesia. Under ultrasound guidance a 6 Fr Safe-T-Centesis catheter was introduced. Thoracentesis was performed. The catheter was removed and a dressing applied. FINDINGS: A total of approximately 1.55 L of hazy amber fluid was removed. Samples were sent to the laboratory as requested by the clinical team. IMPRESSION: Successful ultrasound guided left thoracentesis yielding 1.55 L of pleural fluid. Read  by: Earley Abide, PA-C Electronically Signed   By: Markus Daft M.D.   On: 05/04/2020 15:12   IR PERC PLEURAL DRAIN W/INDWELL CATH W/IMG GUIDE  Result Date: 05/10/2020 CLINICAL DATA:  Recurrent malignant left pleural effusion and need for tunneled pleural drainage catheter prior to discharge from the hospital. EXAM: INSERTION OF TUNNELED PLEURAL DRAINAGE CATHETER ANESTHESIA/SEDATION: 1.0 mg IV Versed; 50 mcg IV Fentanyl. Total Moderate Sedation Time: 27 minutes. The patient's level of consciousness and physiologic status were continuously monitored during the procedure by Radiology nursing. MEDICATIONS: 2 g IV Ancef. Antibiotic was administered in an appropriate time interval for the procedure. FLUOROSCOPY TIME:  6 seconds.  1.0 mGy. PROCEDURE: The procedure, risks, benefits, and alternatives were explained to the patient. Questions regarding the procedure were encouraged and answered. The patient understands and consents to the procedure. A time-out was performed prior to initiating the procedure. The left chest wall was prepped with chlorhexidine in a sterile fashion, and a sterile drape was applied covering the operative field. A sterile gown and sterile gloves were used for the procedure. Local anesthesia was provided with 1% Lidocaine. Ultrasound image documentation was performed. Fluoroscopy during the procedure and fluoroscopic  spot radiograph confirms appropriate catheter position. After creating a small skin incision, a 19 gauge needle was advanced into the pleural cavity under ultrasound guidance. A guide wire was then advanced under fluoroscopy into the pleural space. Pleural access was dilated serially and a 16-French peel-away sheath placed. A 15.5 French tunneled PleurX catheter was placed. This was tunneled from an incision 5 cm below the pleural access to the access site. The catheter was advanced through the peel-away sheath. The sheath was then removed. Final catheter positioning was confirmed with a fluoroscopic spot image. The access incision was closed with subcutaneous and subcuticular 4-0 Vicryl. Dermabond was applied to the incision. A Prolene retention suture was applied at the catheter exit site. Thoracentesis was then performed via a vacuum bottle. COMPLICATIONS: None. FINDINGS: The catheter was placed via the left lower lateral chest wall. Approximately 1.1 liters of pleural fluid was able to be removed after catheter placement. IMPRESSION: Placement of permanent, tunneled left pleural drainage catheter via lateral approach. 1.1 liters of pleural fluid was removed today after catheter placement. Electronically Signed   By: Aletta Edouard M.D.   On: 05/10/2020 14:15    ASSESSMENT: This is a very pleasant 87 year old Caucasian female diagnosed with at least stage IV (T2b, N2, M1a), non-small cell lung cancer, adenocarcinoma.  The patient presented with a left upper lobe mass, suspicious subcarinal adenopathy, a malignant left pleural effusion, and prominent left upper lobe pulmonary interstitium, concerning for lymphangitic spread of disease.  pending further staging work-up with PET scan and MRI.  She was diagnosed in December 2021.  Her PD-L1 expression is pending and her molecular studies by [facility] [employer] are pending.  PLAN: Dr. Julien Nordmann had a lengthy discussion today with the patient about her current condition  and possible treatment options. Given the patient's brief smoking history and cytology being consistent with adenocarcinoma, the patient may be a candidate for targeted therapy if she has an actionable mutation. The patient had [employer] blood first tissue next molecular testing performed today to assess for this. We expect the results in 2-3 weeks. Her PDL1 is also pending. If she is negative for any actionable mutations, the treatment options would likely consist of palliative chemotherapy/immunotherapy.   In the meantime, we recommend for the patient to complete the staging workup with  a PET scan and brain MRI.   We will see the patient back for a follow up visit in 2-3 weeks for evaluation, to review her PET/MRI brain, and for a more detailed discussion about her current condition and recommended treatment options based on the results of her molecular studies.   The patient voices understanding of current disease status and treatment options and is in agreement with the current care plan.  All questions were answered. The patient knows to call the clinic with any problems, questions or concerns. We can certainly see the patient much sooner if necessary.  Thank you so much for allowing me to participate in the care of [place]. I will continue to follow up the patient with you and assist in her care. The total time spent in the appointment was 80 minutes.  Disclaimer: This note was dictated with voice recognition software. Similar sounding words can inadvertently be transcribed and may not be corrected upon review.   Cassandra L Heilingoetter May 17, 2020, 2:58 PM  ADDENDUM: Hematology/Oncology Attending: I had a face-to-face encounter with the patient today.  I recommended her care plan.  The patient mentioned and 2021 she started having new cough as well as abdominal fullness.  Her symptoms were getting worse and the patient started having shortness of breath.  She had chest  x-ray performed on April 28, 2020 and it showed left large pleural effusion.  She presented to the emergency department few days later with worsening symptoms and CT scan of the chest on May 02, 2020 showed moderate left and small right pleural effusions.  The left pleural effusion demonstrate areas of constipations pleural thickening and small pleural nodularity concerning for empyema or malignant pleural effusion with pleural deposits.  There was heterogeneous enhancement of the adjacent atelectatic lung parenchyma including within the lingula, anterior segment of the left upper lobe and anterior basal segment of the left lower lobe that could reflect underlying airspace disease or malignancy.  The patient underwent ultrasound-guided left thoracentesis with drainage of 1.55 L of pleural fluid.  The cytology ([id number]) showed malignant cells consistent with adenocarcinoma.  Repeat CT scan of the chest on May 04, 2020 showed a masslike consolidation of the left upper lobe abutting the mediastinum and measuring 4.4 x 4.1 x 4.6 cm.  The medial extent of this mass invades the mediastinum at the level of the AP window.  The findings are consistent with underlying malignancy and postobstructive changes.  There was prominence of the left upper lobe pulmonary interstitium concerning for lymphangitic spread of disease as well as subcarinal lymphadenopathy worrisome for nodal metastasis.  There was a small bilateral pleural effusions partially loculated on the left and decreased after the recent thoracentesis. The patient also underwent left Pleurx catheter placement by pulmonary medicine and she is currently managed by Dr. Valeta Harms.  She continues to have daily drainage of the pleural fluid of around 150 mL daily. The patient was referred to me today for evaluation and recommendation regarding treatment of her condition. I had a lengthy discussion with the patient today about her current disease stage,  prognosis and treatment options.  I personally and independently reviewed the scan images and discussed the result and showed the images to the patient today. Unfortunately the patient has a stage IV (T2b, N2, M1 a) non-small cell lung cancer, adenocarcinoma presented with large left upper lobe lung mass in addition to mediastinal lymphadenopathy and malignant left pleural effusion as well as suspicious lymphangitic spread of  the tumor. I recommended for the patient to complete the staging work-up by ordering a PET scan as well as MRI of the brain. I also recommended to send blood test first followed by tissue next to [facility] for evaluation of any molecular actionable mutation as well as PD-L1 expression. I will arrange for the patient to come back for follow-up visit in around 2-3 weeks for more detailed discussion of her treatment options based on the molecular studies. If the patient has no actionable mutations, I would consider her for systemic chemotherapy with carboplatin, Alimta and Keytruda but we will also discussed the option of palliative care on hospice at that point. The patient agreed to the current plan. She was advised to call immediately if she has any other concerning symptoms in the interval.  Disclaimer: This note was dictated with voice recognition software. Similar sounding words can inadvertently be transcribed and may be missed upon review. Eilleen Kempf, MD 05/17/20

## 2020-05-15 NOTE — Assessment & Plan Note (Signed)
Hypophosphatemia, P 3.0, repeat Mg, Phos

## 2020-05-15 NOTE — Assessment & Plan Note (Signed)
Hospitalized 05/01/20-05/11/20 CT showed left upper lobe mass invades the mediastinum at the level of the aortic or pulmonary  window, concerning for lymphangitic spread of disease,  oncology Dr. Julien Nordmann 05/17/20, update CBC/CMP, Mg, Phos, CXR one week

## 2020-05-15 NOTE — Assessment & Plan Note (Signed)
Edema BL BNP normal, EF 60-65%, negative DVT venous US. Takes HCTZ

## 2020-05-15 NOTE — Assessment & Plan Note (Signed)
GERD, takes Omeprazole, poor appetite, early satiety, f/u oncology eval for mass.

## 2020-05-15 NOTE — Assessment & Plan Note (Signed)
Continue Atorvastatin

## 2020-05-15 NOTE — Assessment & Plan Note (Addendum)
Blood pressure is controlled, the patient stated her baseline Sbp is 130-140s, likes to reduce Amlodipine since it caused her swelling in legs in the past,  continue Metoprolol, reduce Amlodipine 2.5mg  qd.

## 2020-05-15 NOTE — Assessment & Plan Note (Signed)
Hospitalized 05/01/20-05/11/20 for a large left pleural effusion, s/p thoracentesis x2, drained 1.5L, treated with Ceftriaxone and Azithromycin initially and stopped when Cytology showed adenocarcinoma, CT showed left upper lobe mass invades the mediastinum at the level of the aortic or pulmonayr window, concerning for lymphangitic spread of disease, pleurX placed, drain prn daily up to 1L/day, call for less than 177ml daily x 3 consecutive drains qod for possible removal of the catheter,  f/u pulmonology Dr. Valeta Harms 05/22/20, oncology Dr. Julien Nordmann 05/17/20, update CBC/CMP, Mg, Phos, CXR one week

## 2020-05-16 ENCOUNTER — Encounter: Payer: Self-pay | Admitting: Nurse Practitioner

## 2020-05-17 ENCOUNTER — Encounter: Payer: Self-pay | Admitting: Physician Assistant

## 2020-05-17 ENCOUNTER — Encounter: Payer: Self-pay | Admitting: Internal Medicine

## 2020-05-17 ENCOUNTER — Non-Acute Institutional Stay (SKILLED_NURSING_FACILITY): Payer: Medicare PPO | Admitting: Internal Medicine

## 2020-05-17 ENCOUNTER — Inpatient Hospital Stay: Payer: Medicare PPO | Attending: Physician Assistant | Admitting: Physician Assistant

## 2020-05-17 ENCOUNTER — Other Ambulatory Visit: Payer: Self-pay

## 2020-05-17 ENCOUNTER — Encounter: Payer: Self-pay | Admitting: Emergency Medicine

## 2020-05-17 ENCOUNTER — Encounter: Payer: Self-pay | Admitting: *Deleted

## 2020-05-17 ENCOUNTER — Inpatient Hospital Stay: Payer: Medicare PPO

## 2020-05-17 VITALS — BP 135/70 | HR 90 | Temp 98.7°F | Resp 18 | Ht 64.0 in | Wt 129.0 lb

## 2020-05-17 DIAGNOSIS — I493 Ventricular premature depolarization: Secondary | ICD-10-CM | POA: Insufficient documentation

## 2020-05-17 DIAGNOSIS — C7931 Secondary malignant neoplasm of brain: Secondary | ICD-10-CM | POA: Insufficient documentation

## 2020-05-17 DIAGNOSIS — C3492 Malignant neoplasm of unspecified part of left bronchus or lung: Secondary | ICD-10-CM | POA: Diagnosis not present

## 2020-05-17 DIAGNOSIS — R590 Localized enlarged lymph nodes: Secondary | ICD-10-CM | POA: Diagnosis not present

## 2020-05-17 DIAGNOSIS — I10 Essential (primary) hypertension: Secondary | ICD-10-CM | POA: Insufficient documentation

## 2020-05-17 DIAGNOSIS — C3490 Malignant neoplasm of unspecified part of unspecified bronchus or lung: Secondary | ICD-10-CM

## 2020-05-17 DIAGNOSIS — C3412 Malignant neoplasm of upper lobe, left bronchus or lung: Secondary | ICD-10-CM | POA: Diagnosis not present

## 2020-05-17 DIAGNOSIS — Z87891 Personal history of nicotine dependence: Secondary | ICD-10-CM | POA: Diagnosis not present

## 2020-05-17 DIAGNOSIS — J90 Pleural effusion, not elsewhere classified: Secondary | ICD-10-CM | POA: Diagnosis not present

## 2020-05-17 DIAGNOSIS — Z7189 Other specified counseling: Secondary | ICD-10-CM

## 2020-05-17 DIAGNOSIS — E785 Hyperlipidemia, unspecified: Secondary | ICD-10-CM

## 2020-05-17 DIAGNOSIS — M810 Age-related osteoporosis without current pathological fracture: Secondary | ICD-10-CM | POA: Diagnosis not present

## 2020-05-17 DIAGNOSIS — Z5111 Encounter for antineoplastic chemotherapy: Secondary | ICD-10-CM | POA: Insufficient documentation

## 2020-05-17 DIAGNOSIS — J910 Malignant pleural effusion: Secondary | ICD-10-CM | POA: Insufficient documentation

## 2020-05-17 DIAGNOSIS — R600 Localized edema: Secondary | ICD-10-CM

## 2020-05-17 DIAGNOSIS — R6881 Early satiety: Secondary | ICD-10-CM | POA: Diagnosis not present

## 2020-05-17 LAB — CBC WITH DIFFERENTIAL (CANCER CENTER ONLY)
Abs Immature Granulocytes: 0.04 10*3/uL (ref 0.00–0.07)
Basophils Absolute: 0.1 10*3/uL (ref 0.0–0.1)
Basophils Relative: 1 %
Eosinophils Absolute: 0.2 10*3/uL (ref 0.0–0.5)
Eosinophils Relative: 2 %
HCT: 44.9 % (ref 36.0–46.0)
Hemoglobin: 15.2 g/dL — ABNORMAL HIGH (ref 12.0–15.0)
Immature Granulocytes: 0 %
Lymphocytes Relative: 9 %
Lymphs Abs: 1.1 10*3/uL (ref 0.7–4.0)
MCH: 29.9 pg (ref 26.0–34.0)
MCHC: 33.9 g/dL (ref 30.0–36.0)
MCV: 88.4 fL (ref 80.0–100.0)
Monocytes Absolute: 0.9 10*3/uL (ref 0.1–1.0)
Monocytes Relative: 7 %
Neutro Abs: 10.9 10*3/uL — ABNORMAL HIGH (ref 1.7–7.7)
Neutrophils Relative %: 81 %
Platelet Count: 370 10*3/uL (ref 150–400)
RBC: 5.08 MIL/uL (ref 3.87–5.11)
RDW: 14.8 % (ref 11.5–15.5)
WBC Count: 13.2 10*3/uL — ABNORMAL HIGH (ref 4.0–10.5)
nRBC: 0 % (ref 0.0–0.2)

## 2020-05-17 LAB — CMP (CANCER CENTER ONLY)
ALT: 21 U/L (ref 0–44)
AST: 27 U/L (ref 15–41)
Albumin: 3.2 g/dL — ABNORMAL LOW (ref 3.5–5.0)
Alkaline Phosphatase: 76 U/L (ref 38–126)
Anion gap: 11 (ref 5–15)
BUN: 16 mg/dL (ref 8–23)
CO2: 26 mmol/L (ref 22–32)
Calcium: 10.3 mg/dL (ref 8.9–10.3)
Chloride: 99 mmol/L (ref 98–111)
Creatinine: 0.9 mg/dL (ref 0.44–1.00)
GFR, Estimated: >60 mL/min (ref >60)
Glucose, Bld: 114 mg/dL — ABNORMAL HIGH (ref 70–99)
Potassium: 3.4 mmol/L — ABNORMAL LOW (ref 3.5–5.1)
Sodium: 136 mmol/L (ref 135–145)
Total Bilirubin: 0.7 mg/dL (ref 0.3–1.2)
Total Protein: 6.7 g/dL (ref 6.5–8.1)

## 2020-05-17 NOTE — Research (Signed)
[id number] - Customer service manager for the Discovery and Validation of Biomarkers for the Prediction, Diagnosis, and Management of Disease  05/17/20  Consent:  Shelley Flores presented alone to the clinic for a new patient appointment. This Research officer, political party met withthe patientfor 15 minutesin a private exam room. She was presented with the informed consent (version 2, revised 11/16/2019) and HIPAA (version 5, revised 04/28/2019) forms for the [facility] NSCLC specimen collection study.  These were reviewed with the patient in their entirety.  Explained the purpose of the study along with potential risks and benefits of participation. Informed patient that participation is completely voluntary andshemay withdraw consent at any time.  Upon completion ofreview,shewas offered an opportunityto ask any questions or express any concerns.Shehad no questions at this time. The patientsignedand dated the informed consent at 2:27 pm, and HIPAA form at 2:48 pm.A copyof the signed consent and HIPPA formswere given to the patient.Eligibility criteria were reviewed by this research coordinator and he meets eligibility criteria.   Plan:  Will plan to collect blood specimen at patient's next scheduled lab appointment.  The patient was thanked for her time and participation.  She was given this coordinator's contact information if she has any questions or concerns.  Clabe Seal Clinical Research Coordinator I  05/17/20 3:57 PM

## 2020-05-17 NOTE — Patient Instructions (Signed)
Summary:  -There are two main categories of lung cancer, they are named based on the size of the cancer cell. One is called Non-Small cell lung cancer. The other type is Small Cell Lung Cancer -The sample (biopsy) that they took of your tumor was consistent with a subtype of Non-small cell lung cancer called Adenocarcinoma. This is the most common type of lung cancer.  -We covered a lot of important information at your appointment today regarding what the treatment plan is moving forward. Here are the the main points that were discussed at your office visit with [place] today:  -The best treatment for you depends on the results of some special testing that we performed today. One of the special tests looks for DNA in the tumor to see if you have certain markers. With certain markers, we have treatment that comes in pill form. We are looking to see if you have any of these markers to see if you would be a candidate for this type of treatment. We also did another test that tells [place] if you have a marker for immunotherapy, which is a certain type of IV medications.  -We should have the results of these special tests in about 10-14 days. We will see you at that time to go over the results and see what the best treatment option is for you. In the meantime, there are some imaging studies that we still need to help [place] with the staging process (see below).    Referrals or Imaging: -PET scan: We still need a special type of scan called a PET scan. This scan is head down and it basically helps [place] see if any of the cancer cells spread anywhere else in the body. This is important as part of the staging workup. Someone from radiology scheduling should call you to schedule this. Please make sure you are on the lookout for a phone call from radiology scheduling to schedule this scan.  -Brain MRI: Since the PET scan does not let us see your brain in detail, we need a special MRI of the head so we can make sure no cancer cells  travelled from the lung to the brain. Similarly, someone from radiology scheduling should call you to schedule this. If you do not hear from them soon, their number is [phone]. If you need to call them, all you need to do is tell them your name and DOB and that you are calling to schedule your scan. They should be able to look you up with your name and DOB and should know what scans you are trying to schedule.   Follow up:  -We will see you back for a follow up visit 14 days to review the scan results and the special genetic test results and for a more detailed discussion on what the best treatment is for you based on the results   -If you need to reach [place] at any time, the main office number to the cancer center is [phone], when you call, ask to speak to either [name]'s or Dr. Worthy Flank nurse.

## 2020-05-17 NOTE — Progress Notes (Signed)
Shelley Flores is a new patient of Dr. Julien Nordmann and Rubin Payor PA-C and had a clinic visit with them today.  I followed up with [name] to see what I could do to help coordinate her care.  [name] needs PET and brain scan.  I reached out to [place] coordinators to help expedite auth.  Once I am updated they have been authed, I will help schedule.

## 2020-05-17 NOTE — Progress Notes (Signed)
Provider:  Veleta Miners MD Location:    [place] Room Number: 04 Place of Service:  SNF ([phone])  PCP: Cari Caraway, MD Patient Care Team: Cari Caraway, MD as PCP - General [facility] Medicine)  Extended Emergency Contact Information Primary Emergency Contact: [facility] Address: [address]          [place], [place] [address] [name] of [place] Phone: [phone] Work Phone: [phone] Relation: Other Secondary Emergency Contact: swiftmick, pat Mobile Phone: [phone] Relation: Friend  Code Status:DNR   Goals of Care: Advanced Directive information Advanced Directives 05/15/2020  Does Patient Have a Medical Advance Directive? Yes  Type of Paramedic of [place];Living will  Does patient want to make changes to medical advance directive? No - Patient declined  Copy of [place] in Chart? Yes - validated most recent copy scanned in chart (See row information)      Chief Complaint  Patient presents with  . New Admit To SNF    Admission to SNF    HPI: Patient is a 87 y.o. female seen today for admission to SNF for therapy and Further care  Patient was admitted from 12/20-12/31 for Left Pleural effusion with Hypoxia Followed by Diagnosis of Lung cancer  Patient has a history of hypertension hyperlipidemia, lower extremity edema.  She was having cough  for the past few months.  Was started on Prilosec but her cough persisted.  She also started having shortness of breath on exertion.  X-ray showed large left pleural effusion.  Was treated with antibiotics.  Underwent thoracocentesis.  Which was positive for malignant cells possible lung adenocarcinoma.  She also had a Pleurix  catheter placed by IR. CT scan of the chest showed left upper lobe consolidation consistent with malignancy with portion of the mass invading the mediastinum at the level of Aortic area Also has a subcarinal  lymphadenopathy worrisome for Nodal Metastasis.  She is now in SNF for therapy . She denies any pain. Does get SOB on Exertion .Also has cough.  Persistent Edema in both LE.  Her other issue is Early satiety and getting full very easily with possible with food getting stuck in her chest and abdomen Before coming here patient was very independent Never married Her friend here is her POA  Past Medical History:  Diagnosis Date  . Bruit    Abdominal bruit - Abdominal aorta/renal duplex Doppler evaluation 12/07/03 -Mildly abnormal evaluation. 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 This is consistant w/median arcuate ligament compression syndrome. *Right & Left Kidney: Essentially equal in size, symmetrical in shape w/no significant abnormalities visualized. *Right & Left Renal Arteries: No significant abnormalities.  . Edema extremities    LE edema   . H/O myocardial perfusion scan 02/20/00   To rule out ischemia - Negative adequate Bruce protocol exercise stress test with a deconditioned exercise response and normal static myocardial perfusion images with EF calculated by QGS of 77%. Represents a low risk study.  . Hyperlipidemia   . Hypertension   . Osteoarthritis   . Osteoporosis   . Pleural effusion 04/2020  . Valvular heart disease    Mild valvular heart disease by Echo in 2010, all mild and symptomatic, including concentric LVH, MR, TR, and AI with pulmonary artery pressure of 32. EF was normal.   Past Surgical History:  Procedure Laterality Date  . Abdominal aorta/Renal duplex Doppler Evaluation  12/07/03   For abdominal bruit. Mildly abnormal evaluation. (See  bruit in medical history)  . BREAST EXCISIONAL BIOPSY Left 1966  . BREAST EXCISIONAL BIOPSY Left 1999  . CATARACT EXTRACTION  2003  . COLONOSCOPY  10/22/2004  . DEXA Bone Scan  06/23/2013  . IR PERC PLEURAL DRAIN W/INDWELL CATH W/IMG GUIDE  05/10/2020  . IR THORACENTESIS ASP PLEURAL SPACE W/IMG GUIDE   05/04/2020    reports that she quit smoking about 58 years ago. Her smoking use included cigarettes. She has a 1.50 pack-year smoking history. She has never used smokeless tobacco. She reports current alcohol use of about 2.0 standard drinks of alcohol per week. She reports that she does not use drugs. Social History   Socioeconomic History  . Marital status: Single    Spouse name: Not on file  . Number of children: Not on file  . Years of education: Not on file  . Highest education level: Not on file  Occupational History  . Occupation: Retired    Fish farm manager: [employer]    Comment: Chemistry professor  Tobacco Use  . Smoking status: Former Smoker    Packs/day: 0.25    Years: 6.00    Pack years: 1.50    Types: Cigarettes    Quit date: 05/13/1962    Years since quitting: 58.0  . Smokeless tobacco: Never Used  Vaping Use  . Vaping Use: Never used  Substance and Sexual Activity  . Alcohol use: Yes    Alcohol/week: 2.0 standard drinks    Types: 2 Standard drinks or equivalent per week    Comment: eine  . Drug use: No  . Sexual activity: Not on file  Other Topics Concern  . Not on file  Social History Narrative  . Not on file   Social Determinants of Health   Financial Resource Strain: Not on file  Food Insecurity: Not on file  Transportation Needs: Not on file  Physical Activity: Not on file  Stress: Not on file  Social Connections: Not on file  Intimate Partner Violence: Not on file    Functional Status Survey:    Family History  Problem Relation Age of Onset  . Leukemia Mother 23  . Cancer Father        esophagial cancer  . Heart failure Maternal Grandmother   . Tuberculosis Maternal Grandfather   . Heart disease Paternal Grandmother   . Cancer Paternal Grandfather     Health Maintenance  Topic Date Due  . COVID-19 Vaccine (4 - Booster) 09/24/2020  . TETANUS/TDAP  10/17/2029  . INFLUENZA VACCINE  Completed  . DEXA SCAN  Completed  . PNA vac Low Risk  Adult  Completed    Allergies  Allergen Reactions  . Irbesartan Nausea Only    Allergies as of 05/17/2020      Reactions   Irbesartan Nausea Only      Medication List       Accurate as of May 17, 2020 10:22 AM. If you have any questions, ask your nurse or doctor.        STOP taking these medications   alendronate 70 MG tablet Commonly known as: FOSAMAX Stopped by: [name], MD   BENZONATATE PO Stopped by: [name], MD   Glucosamine HCl 1000 MG Tabs Stopped by: [name], MD   guaiFENesin-dextromethorphan 100-10 MG/5ML syrup Commonly known as: ROBITUSSIN DM Stopped by: [name], MD   Viactiv Calcium Plus D 650-12.5-40 MG-MCG-MCG Chew Generic drug: Calcium-Vitamin D-Vitamin K Stopped by: [name], MD  TAKE these medications   acetaminophen 325 MG tablet Commonly known as: TYLENOL Take 2 tablets (650 mg total) by mouth every 6 (six) hours as needed for mild pain (or Fever >/= 101).   amLODipine 2.5 MG tablet Commonly known as: NORVASC Take 2.5 mg by mouth daily.   aspirin 81 MG tablet Take 81 mg by mouth daily.   atorvastatin 20 MG tablet Commonly known as: LIPITOR TAKE 1 TABLET BY MOUTH EVERY DAY What changed: when to take this   hydrochlorothiazide 12.5 MG capsule Commonly known as: MICROZIDE TAKE 1 CAPSULE BY MOUTH EVERY DAY What changed: how much to take   metoprolol succinate 50 MG 24 hr tablet Commonly known as: TOPROL-XL TAKE 1 TABLET (50 MG TOTAL) BY MOUTH DAILY. TAKE WITH OR IMMEDIATELY FOLLOWING A MEAL.   multivitamin tablet Take 1 tablet by mouth daily.   omeprazole 20 MG capsule Commonly known as: PRILOSEC Take 20 mg by mouth 2 (two) times daily.   zinc oxide 20 % ointment Apply 1 application topically as needed for irritation. apply to buttocks/peri, topical, As Needed, To buttocks after every incontinent episode and as needed for redness. May keep at bedside.       Review of Systems   Constitutional: Positive for activity change, appetite change and unexpected weight change.  HENT: Positive for trouble swallowing.   Respiratory: Positive for cough and shortness of breath.   Cardiovascular: Positive for leg swelling.  Gastrointestinal: Negative.   Genitourinary: Negative.   Musculoskeletal: Negative.   Skin: Negative.   Neurological: Positive for weakness.  Psychiatric/Behavioral: Negative.     Vitals:   05/17/20 1008  BP: 130/64  Pulse: 78  Resp: 18  Temp: (!) 97.2 F (36.2 C)  SpO2: 95%  Weight: 122 lb (55.3 kg)  Height: 5\' 4"  (1.626 m)   Body mass index is 20.94 kg/m. Physical Exam Constitutional: Oriented to person, place, and time. Well-developed and well-nourished.  HENT:  Head: Normocephalic.  Mouth/Throat: Oropharynx is clear and moist.  Eyes: Pupils are equal, round, and reactive to light.  Neck: Neck supple.  Cardiovascular: Normal rate and normal heart sounds.  No murmur heard. Pulmonary/Chest: Effort normal and breath sounds normal. No respiratory distress. No wheezes. She has no rales.  Abdominal: Soft. Bowel sounds are normal. No distension. There is no tenderness. There is no rebound.  Musculoskeletal: Moderate Edema Bilateral Lymphadenopathy: none Neurological: Alert and oriented to person, place, and time.  Skin: Skin is warm and dry.  Psychiatric: Normal mood and affect. Behavior is normal. Thought content normal.   Labs reviewed: Basic Metabolic Panel: Recent Labs    05/09/20 0319 05/10/20 0322 05/11/20 0246  NA 136 137 135  K 4.3 4.2 3.9  CL 106 105 104  CO2 26 23 24   GLUCOSE 108* 112* 115*  BUN 8 11 12   CREATININE 0.79 0.77 0.81  CALCIUM 9.6 9.8 9.6  MG 2.1 2.1 2.1  PHOS 3.1 3.0 2.7   Liver Function Tests: Recent Labs    05/09/20 0319 05/10/20 0322 05/11/20 0246  AST 17 24 19   ALT 20 20 17   ALKPHOS 51 54 48  BILITOT 0.5 0.6 0.8  PROT 5.1* 5.2* 5.2*  ALBUMIN 2.4* 2.5* 2.4*   No results for input(s):  LIPASE, AMYLASE in the last 8760 hours. No results for input(s): AMMONIA in the last 8760 hours. CBC: Recent Labs    05/09/20 0319 05/10/20 0322 05/11/20 0246  WBC 11.6* 12.2* 11.1*  NEUTROABS 8.5* 9.5* 8.1*  HGB 14.0  14.2 14.6  HCT 43.3 43.1 42.3  MCV 90.8 89.0 87.9  PLT 248 251 237   Cardiac Enzymes: No results for input(s): CKTOTAL, CKMB, CKMBINDEX, TROPONINI in the last 8760 hours. BNP: Invalid input(s): POCBNP No results found for: HGBA1C No results found for: TSH No results found for: VITAMINB12 No results found for: FOLATE No results found for: IRON, TIBC, FERRITIN  Imaging and Procedures obtained prior to SNF admission: DG Chest 2 View  Result Date: 05/01/2020 CLINICAL DATA:  Shortness of breath for 5 days, diagnosed with a LEFT pleural effusion on Friday, history hypertension, former smoker EXAM: CHEST - 2 VIEW COMPARISON:  04/28/2020 FINDINGS: Subtotal opacification of the LEFT hemithorax by large LEFT pleural effusion. Significant atelectasis of LEFT lung and mediastinal shift LEFT to RIGHT. Small RIGHT pleural effusion identified. Heart size poorly seen. No pneumothorax or RIGHT lung consolidation identified. Bones demineralized. IMPRESSION: Large LEFT pleural effusion with subtotal atelectasis of LEFT lung and mediastinal shift LEFT to RIGHT little changed since 04/28/2020. Electronically Signed   By: Lavonia Dana M.D.   On: 05/01/2020 16:54   CT CHEST ABDOMEN PELVIS W CONTRAST  Addendum Date: 05/02/2020   ADDENDUM REPORT: 05/02/2020 04:13 ADDENDUM: These results were called by telephone at the time of interpretation on 05/02/2020 at 4:13 am to provider [facility] , who verbally acknowledged these results. Electronically Signed   By: Lovena Le M.D.   On: 05/02/2020 04:13   Result Date: 05/02/2020 CLINICAL DATA:  Pleural effusion, malignancy suspected EXAM: CT CHEST, ABDOMEN, AND PELVIS WITH CONTRAST TECHNIQUE: Multidetector CT imaging of the chest, abdomen  and pelvis was performed following the standard protocol during bolus administration of intravenous contrast. CONTRAST:  153mL OMNIPAQUE IOHEXOL 300 MG/ML  SOLN COMPARISON:  Radiograph 05/02/2019 FINDINGS: CT CHEST FINDINGS Cardiovascular: The aortic root is suboptimally assessed given cardiac pulsation artifact. Atherosclerotic plaque within the normal caliber aorta. No acute luminal abnormality of the imaged aorta. No periaortic stranding or hemorrhage. Normal 3 vessel branching of the aortic arch. Proximal great vessels are mildly calcified but otherwise unremarkable. Cardiac size is within normal limits. Trace pericardial effusion. Coronary artery calcifications. Central pulmonary arteries are normal caliber. No large central filling defects are present with more distal evaluation limited on this non tailored examination of the pulmonary arteries. Mediastinum/Nodes: Scattered conspicuous mediastinal nodes including several enlarged nodes towards the cardiac apex measuring up to 10 mm short axis (3/46) additional posterior mediastinal nodes are present including a left periaortic node measuring 8 mm (3/43 and several subpleural nodules, likely small lymph nodes (3/36) subcarinal lymph node measures up to 16 mm (3/26). Lungs/Pleura: Moderate left and small right pleural effusions. The left pleural effusion demonstrates areas of conspicuous pleural thickening (3/34) and small pleural nodules (3/23). While there are adjacent areas of passive atelectatic change heterogeneous enhancement of the atelectatic lung parenchyma particularly within collapsed portions of lingula, anterior segment left upper lobe and anterior basal segment of the left lower raise concern for underlying airspace disease or malignancy. Aerated portions of the left upper and lower lobe demonstrate additional mixed ground-glass and consolidative opacity worrisome for are further airspace disease. Relative sparing in the aerated portions of the  right lung. Musculoskeletal: There is asymmetric soft tissue edema across the left chest wall. No clear extension of the pleural or airspace processes in to the chest wall proper. No acute or worrisome osseous lesions. Heterogeneously dense breast tissue without focal breast nodularity or masses. Few benign macro calcifications. Multilevel degenerative changes in the spine  and shoulders. Dextrocurvature of the thoracic spine. CT ABDOMEN PELVIS FINDINGS Hepatobiliary: Few scattered subcentimeter hypoattenuating foci are present in the liver, largest in the left lobe measuring up to 8 mm (3/51). Too small to fully characterize on CT imaging. No other focal concerning liver lesions. Gallbladder is unremarkable. No pericholecystic fluid or inflammation. No visible intraductal gallstones or biliary dilatation. Pancreas: No pancreatic ductal dilatation or surrounding inflammatory changes. Spleen: Few punctate hypoattenuating foci in the spleen are too small to characterize. No worrisome focal splenic lesions. Normal splenic size. Adrenals/Urinary Tract: No concerning adrenal nodules or masses. Multiple fluid attenuation cysts are present in both kidneys, largest are present in the upper pole left kidney measuring up to 7.4 cm with some thin peripheral mural calcification (Bosniak 2). No concerning focal renal lesions. No urolithiasis or hydronephrosis. Urinary bladder is largely decompressed at the time of exam and therefore poorly evaluated by CT imaging. No gross bladder abnormality. Stomach/Bowel: Mild thickening at the gastric antrum is likely related to normal peristaltic motion. Duodenum is unremarkable. High attenuation enteric contrast material partially traverses the colon. No small bowel thickening or dilatation. No proximal colonic abnormalities are identified. Some questionable thickening of the rectosigmoid though may be related to underdistention. However some perirectal fat stranding is present which is  nonspecific. No evidence of obstruction. Appendix is not visualized. No focal inflammation the vicinity of the cecum to suggest an occult appendicitis. Vascular/Lymphatic: Atherosclerotic calcifications within the abdominal aorta and branch vessels. No aneurysm or ectasia. No enlarged abdominopelvic lymph nodes. Reproductive: Patient appears to be post hysterectomy. No concerning adnexal lesion. Other: Mild presacral fat stranding and trace fluid in the pelvis, possibly reactive fluid status or reactive change. No free air. Mild body wall edema most pronounced over the left flank and hip. No bowel containing hernias. Musculoskeletal: Multilevel degenerative changes are present in the imaged portions of the spine. Stepwise retrolisthesis L1-L4. Levocurvature of the lumbar spine. No spondylolysis. Additional degenerative changes in the hips and pelvis. No acute osseous abnormality or suspicious osseous lesion. Sclerotic focus in the right ilium (6/53) is likely bone island. IMPRESSION: 1. Moderate left and small right pleural effusions. The left pleural effusion demonstrates areas of conspicuous pleural thickening and small pleural nodularity. While this could reflect empyema, a malignant effusion with pleural deposits would present similarly. Furthermore, there is heterogeneous enhancement of the adjacent atelectatic lung parenchyma including within the lingula, anterior segment left upper lobe and anterior basal segment of the left lower lobe. Could reflect underlying airspace disease or malignancy with additional airspace disease seen throughout aerated portions of the left lung. 2. Asymmetric soft tissue edema across the left chest wall. No clear extension of the pleural or airspace processes in to the chest wall proper. Possibly reactive though should with exam findings and continued attention on follow-up imaging is recommended. 3. Some questionable thickening of the rectosigmoid though may be related to  underdistention though some mild perirectal fat stranding is present however and could suggest an underlying proctitis. 4. Aortic Atherosclerosis (ICD10-I70.0). Currently attempting to contact the ordering provider with a critical value result. Addendum will be submitted upon case discussion. Electronically Signed: By: Lovena Le M.D. On: 05/02/2020 04:04   DG Chest Portable 1 View  Result Date: 05/01/2020 CLINICAL DATA:  Shortness of breath, status post thoracentesis. EXAM: PORTABLE CHEST 1 VIEW COMPARISON:  Chest x-ray 05/01/2020 FINDINGS: The heart size and mediastinal contours are not well visualized due to overlying pleural effusion and pulmonary changes. These airspace opacity of  the left lung. No pulmonary edema. Interval decrease in a now small to moderate volume left pleural effusion. Persistent trace right pleural effusion. No pneumothorax. No acute osseous abnormality. IMPRESSION: 1. Interval decrease in size of a now small to moderate volume left pleural effusion. Underlying infection, inflammation, pulmonary mass not excluded. 2. Trace right pleural effusion. Electronically Signed   By: Iven Finn M.D.   On: 05/01/2020 20:50    Assessment/Plan Bilateral leg edema Will discontinue Norvasc and HCTZ Start on Lasix 20 mg QD Repeat BMP in 1 week   Adenocarcinoma of left lung ([place]) Has appointment with Oncology today and Pulmonary Next week No pain Continues to c/o Poor appetite   Pleural effusion Has a drain by IR SOB much improved Does have cough Essential hypertension On Metoprolol Discontinue Norvasc and HCTZ   Hyperlipidemia, unspecified hyperlipidemia type Continue Lipitor for now Early satiety ? Dysphagia Not sure as she keep saying food feels stuck in chest and abdomen CT scan was negative Plan for PET scan per Oncology If no reason found will consider GI Is working with Speech in the facility   Family/ staff Communication:   Labs/tests ordered: BMP  ,Magnesium and Phosphorus

## 2020-05-18 ENCOUNTER — Other Ambulatory Visit: Payer: Self-pay | Admitting: *Deleted

## 2020-05-18 NOTE — Progress Notes (Signed)
The proposed treatment discussed in cancer conference 05/18/20 is for discussion purpose only and is not a binding recommendation.  The patient was not physically examined nor present for their treatment options.  Therefore, final treatment plans cannot be decided.

## 2020-05-19 ENCOUNTER — Telehealth: Payer: Self-pay | Admitting: *Deleted

## 2020-05-19 NOTE — Telephone Encounter (Signed)
I called [facility] today to update patient's nurse of her appt for MRI brain and PET scan. I gave her appt time, data, location, and pre-procedure instructions to her nurse.  She verbalized understanding.

## 2020-05-22 ENCOUNTER — Encounter: Payer: Self-pay | Admitting: Nurse Practitioner

## 2020-05-22 ENCOUNTER — Non-Acute Institutional Stay (SKILLED_NURSING_FACILITY): Payer: Medicare PPO | Admitting: Nurse Practitioner

## 2020-05-22 ENCOUNTER — Telehealth: Payer: Self-pay | Admitting: Internal Medicine

## 2020-05-22 ENCOUNTER — Other Ambulatory Visit: Payer: Self-pay

## 2020-05-22 ENCOUNTER — Encounter: Payer: Self-pay | Admitting: Pulmonary Disease

## 2020-05-22 ENCOUNTER — Ambulatory Visit (INDEPENDENT_AMBULATORY_CARE_PROVIDER_SITE_OTHER): Payer: Medicare PPO | Admitting: Pulmonary Disease

## 2020-05-22 VITALS — BP 122/72 | HR 96 | Temp 97.0°F | Ht 64.0 in | Wt 125.0 lb

## 2020-05-22 DIAGNOSIS — I10 Essential (primary) hypertension: Secondary | ICD-10-CM | POA: Diagnosis not present

## 2020-05-22 DIAGNOSIS — R609 Edema, unspecified: Secondary | ICD-10-CM

## 2020-05-22 DIAGNOSIS — R011 Cardiac murmur, unspecified: Secondary | ICD-10-CM | POA: Diagnosis not present

## 2020-05-22 DIAGNOSIS — J90 Pleural effusion, not elsewhere classified: Secondary | ICD-10-CM

## 2020-05-22 DIAGNOSIS — Z9689 Presence of other specified functional implants: Secondary | ICD-10-CM | POA: Diagnosis not present

## 2020-05-22 DIAGNOSIS — E876 Hypokalemia: Secondary | ICD-10-CM | POA: Diagnosis not present

## 2020-05-22 DIAGNOSIS — E785 Hyperlipidemia, unspecified: Secondary | ICD-10-CM

## 2020-05-22 DIAGNOSIS — C3492 Malignant neoplasm of unspecified part of left bronchus or lung: Secondary | ICD-10-CM

## 2020-05-22 DIAGNOSIS — J910 Malignant pleural effusion: Secondary | ICD-10-CM

## 2020-05-22 DIAGNOSIS — K219 Gastro-esophageal reflux disease without esophagitis: Secondary | ICD-10-CM | POA: Diagnosis not present

## 2020-05-22 DIAGNOSIS — E871 Hypo-osmolality and hyponatremia: Secondary | ICD-10-CM

## 2020-05-22 DIAGNOSIS — R918 Other nonspecific abnormal finding of lung field: Secondary | ICD-10-CM | POA: Diagnosis not present

## 2020-05-22 NOTE — Progress Notes (Signed)
Location:    [facility] Room Number: 4 Place of Service:  SNF (31) Provider:  Marlana Latus NP  Cari Caraway, MD  Patient Care Team: Cari Caraway, MD as PCP - General (Family Medicine) Valrie Hart, RN as Oncology Nurse Navigator (Oncology)  Extended Emergency Contact Information Primary Emergency Contact: Shelley Flores Address: [address]          [place], [place] [address] Shelley Flores of [place] Phone: [phone] Work Phone: [phone] Relation: Other Secondary Emergency Contact: Shelley Flores, pat Mobile Phone: [phone] Relation: Friend  Code Status:  DNR Goals of care: Advanced Directive information Advanced Directives 05/17/2020  Does Patient Have a Medical Advance Directive? Yes  Type of Advance Directive [employer]  Does patient want to make changes to medical advance directive? No - Patient declined  Copy of [place] in Chart? Yes - validated most recent copy scanned in chart (See row information)     Chief Complaint  Patient presents with  . Acute Visit    Swelling legs    HPI:  Pt is a 87 y.o. female seen today for an acute visit for BLE edema, persisted, the patient desires more diuresis. The patient is in her usual state of health-DOE, hacking cough, poor appetite.   Hospitalized 05/01/20-05/11/20 for a large left pleural effusion, s/p thoracentesis x2, drained 1.5L, Cytology showed adenocarcinoma, CT showed left upper lobe mass invades the mediastinum at the level of the aortic or pulmonayr window, concerning for lymphangitic spread of disease, pleurX placed, drain prn daily up to 1L/day, call for less than 123ml daily x 3 consecutive drains qod for possible removal of the catheter,  f/u pulmonology Dr. Valeta Harms 05/22/20, oncology Dr. Julien Nordmann 05/17/20 wbc 13.2 Hgb 15.2, neutrophils 81% 05/17/20, the treatment options would likely consist of palliative chemotherapy/immunotherapy.               HTN, takes Metoprolol.              Hyperlipidemia, takes Atorvastatin             SOB/mild valvular heart disease             GERD, takes Omeprazole, poor appetite, early satiety, f/u oncology eval for mass.             Hypophosphatemia, P 3.3, Mg 2.2 05/22/20             Hypokalemia, corrected, K 3.4 05/17/20             Hyponatremia, corrected, Na 136 05/17/20             Edema BL BNP normal, EF 60-65%, negative DVT venous US. Takes Furosemide 20mg  qd since 05/17/20,  Bun/creat 16/0.9 05/17/20             OP the patient requested to be off Fosamax.     Past Medical History:  Diagnosis Date  . Bruit    Abdominal bruit - Abdominal aorta/renal duplex Doppler evaluation 12/07/03 -Mildly abnormal evaluation. 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 This is consistant w/median arcuate ligament compression syndrome. *Right & Left Kidney: Essentially equal in size, symmetrical in shape w/no significant abnormalities visualized. *Right & Left Renal Arteries: No significant abnormalities.  . Edema extremities    LE edema   . H/O myocardial perfusion scan 02/20/00   To rule out ischemia - Negative adequate Bruce protocol exercise stress test with a deconditioned exercise response and normal static myocardial  perfusion images with EF calculated by QGS of 77%. Represents a low risk study.  . Hyperlipidemia   . Hypertension   . Osteoarthritis   . Osteoporosis   . Pleural effusion 04/2020  . Valvular heart disease    Mild valvular heart disease by Echo in 2010, all mild and symptomatic, including concentric LVH, MR, TR, and AI with pulmonary artery pressure of 32. EF was normal.   Past Surgical History:  Procedure Laterality Date  . Abdominal aorta/Renal duplex Doppler Evaluation  12/07/03   For abdominal bruit. Mildly abnormal evaluation. (See bruit in medical history)  . BREAST EXCISIONAL BIOPSY Left 1966  . BREAST EXCISIONAL BIOPSY Left 1999  . CATARACT EXTRACTION  2003  . COLONOSCOPY   10/22/2004  . DEXA Bone Scan  06/23/2013  . IR PERC PLEURAL DRAIN W/INDWELL CATH W/IMG GUIDE  05/10/2020  . IR THORACENTESIS ASP PLEURAL SPACE W/IMG GUIDE  05/04/2020    Allergies  Allergen Reactions  . Irbesartan Nausea Only    Allergies as of 05/22/2020      Reactions   Irbesartan Nausea Only      Medication List       Accurate as of May 22, 2020 11:59 PM. If you have any questions, ask your nurse or doctor.        STOP taking these medications   hydrochlorothiazide 12.5 MG tablet Commonly known as: HYDRODIURIL Stopped by: [name], NP     TAKE these medications   acetaminophen 325 MG tablet Commonly known as: TYLENOL Take 2 tablets (650 mg total) by mouth every 6 (six) hours as needed for mild pain (or Fever >/= 101).   aspirin 81 MG tablet Take 81 mg by mouth daily.   atorvastatin 20 MG tablet Commonly known as: LIPITOR TAKE 1 TABLET BY MOUTH EVERY DAY What changed: when to take this   furosemide 20 MG tablet Commonly known as: LASIX Take 20 mg by mouth daily.   metoprolol succinate 50 MG 24 hr tablet Commonly known as: TOPROL-XL Take 50 mg by mouth daily. Take with or immediately following a meal.   multivitamin tablet Take 1 tablet by mouth daily.   omeprazole 20 MG capsule Commonly known as: PRILOSEC Take 20 mg by mouth 2 (two) times daily.   potassium chloride SA 20 MEQ tablet Commonly known as: KLOR-CON Take 20 mEq by mouth every morning.   potassium chloride SA 20 MEQ tablet Commonly known as: KLOR-CON Take 20 mEq by mouth 2 (two) times daily.   zinc oxide 20 % ointment Apply 1 application topically as needed for irritation. apply to buttocks/peri, topical, As Needed, To buttocks after every incontinent episode and as needed for redness. May keep at bedside.       Review of Systems  Constitutional: Positive for appetite change and fatigue. Negative for chills, diaphoresis and fever.       Weight loss #5Ibs in the past 5 days.    HENT: Positive for hearing loss. Negative for congestion, trouble swallowing and voice change.   Eyes: Negative for visual disturbance.  Respiratory: Positive for shortness of breath. Negative for cough and wheezing.   Cardiovascular: Positive for leg swelling. Negative for chest pain and palpitations.  Gastrointestinal: Positive for abdominal distention. Negative for abdominal pain, blood in stool, constipation, diarrhea and vomiting.       Poor appetite, early satiety.   Genitourinary: Negative for dysuria, hematuria and urgency.  Musculoskeletal: Positive for gait problem.  Skin: Negative for color change.  Neurological: Negative for dizziness, speech difficulty, weakness and headaches.  Psychiatric/Behavioral: Negative for behavioral problems and sleep disturbance. The patient is not nervous/anxious.     Immunization History  Administered Date(s) Administered  . Fluad Quad(high Dose 65+) 01/06/2019, 02/08/2020  . Influenza Split 08/14/2010, 01/16/2012, 02/02/2013, 01/24/2015, 02/01/2016, 02/19/2017, 01/28/2018  . Influenza, High Dose Seasonal PF 01/24/2015, 02/01/2016, 01/28/2018  . Influenza-Unspecified 02/01/2016  . Moderna Sars-Covid-2 Vaccination 05/17/2019, 06/14/2019  . Pneumococcal Conjugate-13 08/11/2013, 09/03/2013  . Pneumococcal Polysaccharide-23 05/28/2004  . Td 07/04/2000  . Tdap 07/04/2009, 10/18/2019  . Unspecified SARS-COV-2 Vaccination 03/27/2020  . Zoster 07/02/2005   Pertinent  Health Maintenance Due  Topic Date Due  . INFLUENZA VACCINE  Completed  . DEXA SCAN  Completed  . PNA vac Low Risk Adult  Completed   No flowsheet data found. Functional Status Survey:    Vitals:   05/22/20 1108  BP: 130/68  Pulse: 99  Resp: 17  Temp: 97.7 F (36.5 C)  SpO2: 96%  Weight: 125 lb (56.7 kg)  Height: 5\' 4"  (1.626 m)   Body mass index is 21.46 kg/m. Physical Exam Vitals and nursing note reviewed.  Constitutional:      Appearance: Normal appearance.      Comments: Frail.  HENT:     Head: Normocephalic and atraumatic.     Mouth/Throat:     Mouth: Mucous membranes are moist.  Eyes:     Extraocular Movements: Extraocular movements intact.     Conjunctiva/sclera: Conjunctivae normal.     Pupils: Pupils are equal, round, and reactive to light.  Cardiovascular:     Rate and Rhythm: Normal rate and regular rhythm.     Heart sounds: Murmur heard.    Pulmonary:     Effort: Pulmonary effort is normal.     Breath sounds: Rales present. No wheezing or rhonchi.     Comments: L pleurx Abdominal:     General: Bowel sounds are normal. There is distension.     Palpations: Abdomen is soft.     Tenderness: There is no abdominal tenderness.  Musculoskeletal:     Cervical back: Normal range of motion and neck supple.     Right Shelley leg: Edema present.     Left Shelley leg: Edema present.     Comments: Trace to 1+ edema BLE  Skin:    General: Skin is warm and dry.  Neurological:     General: No focal deficit present.     Mental Status: She is alert and oriented to person, place, and time. Mental status is at baseline.     Gait: Gait abnormal.  Psychiatric:        Mood and Affect: Mood normal.        Behavior: Behavior normal.        Thought Content: Thought content normal.        Judgment: Judgment normal.     Labs reviewed: Recent Labs    05/09/20 0319 05/10/20 0322 05/11/20 0246 05/17/20 1300  NA 136 137 135 136  K 4.3 4.2 3.9 3.4*  CL 106 105 104 99  CO2 26 23 24 26   GLUCOSE 108* 112* 115* 114*  BUN 8 11 12 16   CREATININE 0.79 0.77 0.81 0.90  CALCIUM 9.6 9.8 9.6 10.3  MG 2.1 2.1 2.1  --   PHOS 3.1 3.0 2.7  --    Recent Labs    05/10/20 0322 05/11/20 0246 05/17/20 1300  AST 24 19 27   ALT 20 17 21  ALKPHOS 54 48 76  BILITOT 0.6 0.8 0.7  PROT 5.2* 5.2* 6.7  ALBUMIN 2.5* 2.4* 3.2*   Recent Labs    05/10/20 0322 05/11/20 0246 05/17/20 1300  WBC 12.2* 11.1* 13.2*  NEUTROABS 9.5* 8.1* 10.9*  HGB 14.2 14.6 15.2*   HCT 43.1 42.3 44.9  MCV 89.0 87.9 88.4  PLT 251 237 370   No results found for: TSH No results found for: HGBA1C Lab Results  Component Value Date   CHOL 139 04/14/2020   HDL 53 04/14/2020   LDLCALC 68 04/14/2020   TRIG 97 04/14/2020   CHOLHDL 2.6 04/14/2020    Significant Diagnostic Results in last 30 days:  DG Chest 1 View  Result Date: 05/06/2020 CLINICAL DATA:  Shortness of breath. EXAM: CHEST  1 VIEW COMPARISON:  05/05/2020 FINDINGS: 0926 hours. Interval progression of left parahilar and retrocardiac basilar collapse/consolidative opacity. Left pleural effusion noted. There is probable some minimal atelectasis at the right base with tiny right pleural effusion. IMPRESSION: 1. Interval progression of left parahilar and retrocardiac basilar collapse/consolidative opacity. 2. Left pleural effusion. 3. Similar appearance of minimal atelectasis/infiltrate at the right base with tiny right effusion. Electronically Signed   By: Misty Stanley M.D.   On: 05/06/2020 09:33   DG Chest 1 View  Result Date: 05/04/2020 CLINICAL DATA:  Post thoracentesis on the left. EXAM: CHEST  1 VIEW COMPARISON:  Radiographs earlier today and 05/03/2020. CT 05/02/2020. FINDINGS: 1416 hours. Interval decreased size of left pleural effusion following thoracentesis. There is improved aeration of the left lung with persistent left perihilar opacity which may reflect a mass or infiltrate. No pneumothorax. The right lung remains clear. There is a stable small right pleural effusion. The heart size and mediastinal contours are stable. IMPRESSION: 1. Interval decreased size of left pleural effusion following thoracentesis with improved aeration of the left lung. No pneumothorax. 2. Persistent left perihilar opacity may reflect a mass or infiltrate. Electronically Signed   By: Richardean Sale M.D.   On: 05/04/2020 14:49   DG Chest 2 View  Result Date: 05/03/2020 CLINICAL DATA:  Shortness of breath, pleural effusion  EXAM: CHEST - 2 VIEW COMPARISON:  CT chest, 05/02/2020, chest radiographs, 05/01/2020 FINDINGS: Large left, small right pleural effusions with associated atelectasis or consolidation are not significantly changed compared to prior examination. No new airspace opacity. The cardiac borders are largely obscured. Osseous structures are unremarkable. IMPRESSION: Large left, small right pleural effusions with associated atelectasis or consolidation are not significantly changed compared to prior examination. No new airspace opacity. Electronically Signed   By: Eddie Candle M.D.   On: 05/03/2020 08:22   DG Chest 2 View  Result Date: 05/01/2020 CLINICAL DATA:  Shortness of breath for 5 days, diagnosed with a LEFT pleural effusion on Friday, history hypertension, former smoker EXAM: CHEST - 2 VIEW COMPARISON:  04/28/2020 FINDINGS: Subtotal opacification of the LEFT hemithorax by large LEFT pleural effusion. Significant atelectasis of LEFT lung and mediastinal shift LEFT to RIGHT. Small RIGHT pleural effusion identified. Heart size poorly seen. No pneumothorax or RIGHT lung consolidation identified. Bones demineralized. IMPRESSION: Large LEFT pleural effusion with subtotal atelectasis of LEFT lung and mediastinal shift LEFT to RIGHT little changed since 04/28/2020. Electronically Signed   By: Lavonia Dana M.D.   On: 05/01/2020 16:54   DG Chest 2 View  Result Date: 04/28/2020 CLINICAL DATA:  Chronic cough and shortness of breath EXAM: CHEST - 2 VIEW COMPARISON:  April 21, 2020 FINDINGS: There is a large  left pleural effusion. There may well be a degree of underlying atelectasis and/or consolidation on the left. There is a minimal right pleural effusion. Right lung otherwise clear. Heart size is normal. Pulmonary vascular on the right is normal. There is distortion of pulmonary vascular on the left due to the large effusion. No adenopathy appreciable. No bone lesions. IMPRESSION: Large left pleural effusion  occupying much of the left hemithorax, similar to recent prior study. Question underlying atelectasis and/or consolidation on the left. Right lung clear except for minimal right pleural effusion. Heart size normal. These results will be called to the ordering clinician or representative by the Radiologist Assistant, and communication documented in the PACS or [employer]. Electronically Signed   By: Lowella Grip III M.D.   On: 04/28/2020 12:51   CT CHEST W CONTRAST  Result Date: 05/04/2020 CLINICAL DATA:  Pleural effusion, left thoracentesis, abnormal chest x-ray EXAM: CT CHEST WITH CONTRAST TECHNIQUE: Multidetector CT imaging of the chest was performed during intravenous contrast administration. CONTRAST:  43mL OMNIPAQUE IOHEXOL 300 MG/ML  SOLN COMPARISON:  05/02/2020 FINDINGS: Cardiovascular: The heart is unremarkable without pericardial effusion. There is normal caliber of the thoracic aorta with no aneurysm or dissection. Minimal atherosclerosis. No evidence of pulmonary embolus. There is extrinsic compression of the left upper lobe pulmonary artery. Mediastinum/Nodes: Subcarinal adenopathy measuring 14 mm in short axis. Thyroid, trachea, and esophagus are unremarkable. Lungs/Pleura: There is masslike consolidation of the left upper lobe abutting the mediastinum, measuring 4.5 x 4.1 x 4.6 cm (see image 61/3 and image 59/5). Medial extent of this mass invades the mediastinum at the level of the aortic or pulmonary window, reference image 57/3 and 59/5. Findings are consistent with underlying malignancy and postobstructive change. PET CT may better delineate the mass from the postobstructive change. Bronchoscopy may be useful for tissue diagnosis if recent thoracentesis was nondiagnostic for malignancy. There are small bilateral pleural effusions. On the left, there is a small loculated component in the anterior left hemithorax adjacent to the left upper lobe mass described above. Scattered  hypoventilatory changes are seen at the lung bases and within the lingula. Interstitial prominence within the left upper lobe could reflect lymphangitic spread of disease. Upper Abdomen: Complex left renal cysts are unchanged. No acute upper abdominal process. Musculoskeletal: No acute or destructive bony lesions. Reconstructed images demonstrate no additional findings. IMPRESSION: 1. Left upper lobe masslike consolidation consistent with malignancy. A portion of the mass invades the mediastinum at the level of the aortic or pulmonary window. PET-CT may be useful to delineate the mass from the postobstructive change. 2. Prominence of the left upper lobe pulmonary interstitium, concerning for lymphangitic spread of disease. 3. A subcarinal lymphadenopathy worrisome for nodal metastasis. Again, PET-CT may be useful. 4. Small bilateral pleural effusions, partially loculated on the left. Decreased left effusion after recent thoracentesis. 5.  Aortic Atherosclerosis (ICD10-I70.0). Electronically Signed   By: Randa Ngo M.D.   On: 05/04/2020 23:09   CT ABDOMEN PELVIS W CONTRAST  Result Date: 05/05/2020 CLINICAL DATA:  Urologic cancer surveillance EXAM: CT ABDOMEN AND PELVIS WITH CONTRAST TECHNIQUE: Multidetector CT imaging of the abdomen and pelvis was performed using the standard protocol following bolus administration of intravenous contrast. CONTRAST:  49mL OMNIPAQUE IOHEXOL 300 MG/ML  SOLN COMPARISON:  CT chest, 05/04/2020, CT chest abdomen pelvis, 05/02/2020 FINDINGS: Shelley chest: Small, left greater than right bilateral pleural effusions, similar to prior examination. Nodular thickening of the dependent left-sided parietal pleura (series 3, image 11). Hepatobiliary: Multiple subcentimeter  fluid and low-attenuation lesions of the liver, not significant changed compared to prior examination, and most likely benign incidental cysts and/or hemangiomata. No gallstones, gallbladder wall thickening, or biliary  dilatation. Pancreas: There is an unchanged 1.1 cm fluid low-attenuation lesion of the pancreatic tail (series 3, image 26). No pancreatic ductal dilatation or surrounding inflammatory changes. Spleen: Normal in size without significant abnormality. Adrenals/Urinary Tract: Adrenal glands are unremarkable. There are multiple bilateral simple renal cysts, the largest in the midportion of the left kidney and inferior pole of the right kidney. Kidneys are otherwise normal, without renal calculi, solid lesion, or hydronephrosis. Bladder is unremarkable. Stomach/Bowel: Stomach is within normal limits. Appendix is not clearly visualized and may be diminutive or surgically absent. There is redemonstrated mild rectal wall thickening and minimal perirectal fat stranding (series 3, image 69). Vascular/Lymphatic: Aortic atherosclerosis. No enlarged abdominal or pelvic lymph nodes. Reproductive: Status post hysterectomy. Other: No abdominal wall hernia or abnormality. No abdominopelvic ascites. Musculoskeletal: No acute or significant osseous findings. IMPRESSION: 1. No definite evidence of malignancy or metastatic disease in the abdomen or pelvis. Examination is generally unchanged compared to recent CT dated 05/02/2020. 2. No evidence of urologic malignancy per ordering indication. Definitively benign bilateral renal cysts for which no further characterization or follow-up is specifically required. 3. Multiple subcentimeter low-attenuation lesions of the liver, generally too small to confidently characterize, although the largest are clearly simple cysts, remaining smaller lesions almost certainly incidental small cysts or hemangiomata. No findings suspicious for hepatic metastatic disease. 4. There is redemonstrated mild rectal wall thickening and minimal perirectal fat stranding, again suggesting nonspecific proctitis. Correlate with referable signs and symptoms if present. Synchronous rectal malignancy would be difficult to  strictly exclude. Consider digital and/or endoscopic examination. 5. There is an unchanged 1.1 cm fluid low-attenuation lesion of the pancreatic tail. 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 There is no pancreatic ductal dilatation. Given new diagnosis of advanced stage lung malignancy, recommend attention on follow-up with future clinically indicated oncology imaging. 6. Small, left greater than right bilateral pleural effusions, similar to prior examination. Nodular thickening of the dependent left-sided parietal pleura. 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 Aortic Atherosclerosis (ICD10-I70.0). Electronically Signed   By: Eddie Candle M.D.   On: 05/05/2020 19:22   CT CHEST ABDOMEN PELVIS W CONTRAST  Addendum Date: 05/02/2020   ADDENDUM REPORT: 05/02/2020 04:13 ADDENDUM: These results were called by telephone at the time of interpretation on 05/02/2020 at 4:13 am to provider [facility] , who verbally acknowledged these results. Electronically Signed   By: Lovena Le M.D.   On: 05/02/2020 04:13   Result Date: 05/02/2020 CLINICAL DATA:  Pleural effusion, malignancy suspected EXAM: CT CHEST, ABDOMEN, AND PELVIS WITH CONTRAST TECHNIQUE: Multidetector CT imaging of the chest, abdomen and pelvis was performed following the standard protocol during bolus administration of intravenous contrast. CONTRAST:  161mL OMNIPAQUE IOHEXOL 300 MG/ML  SOLN COMPARISON:  Radiograph 05/02/2019 FINDINGS: CT CHEST FINDINGS Cardiovascular: The aortic root is suboptimally assessed given cardiac pulsation artifact. Atherosclerotic plaque within the normal caliber aorta. No acute luminal abnormality of the imaged aorta. No periaortic stranding or hemorrhage. Normal 3 vessel branching of the aortic arch. Proximal great vessels are mildly calcified but otherwise unremarkable. Cardiac size is within normal limits. Trace pericardial effusion. Coronary artery calcifications.  Central pulmonary arteries are normal caliber. No large central filling defects are present with more distal evaluation limited on this non tailored examination of the pulmonary arteries.  Mediastinum/Nodes: Scattered conspicuous mediastinal nodes including several enlarged nodes towards the cardiac apex measuring up to 10 mm short axis (3/46) additional posterior mediastinal nodes are present including a left periaortic node measuring 8 mm (3/43 and several subpleural nodules, likely small lymph nodes (3/36) subcarinal lymph node measures up to 16 mm (3/26). Lungs/Pleura: Moderate left and small right pleural effusions. The left pleural effusion demonstrates areas of conspicuous pleural thickening (3/34) and small pleural nodules (3/23). While there are adjacent areas of passive atelectatic change heterogeneous enhancement of the atelectatic lung parenchyma particularly within collapsed portions of lingula, anterior segment left upper lobe and anterior basal segment of the left Shelley raise concern for underlying airspace disease or malignancy. Aerated portions of the left upper and Shelley lobe demonstrate additional mixed ground-glass and consolidative opacity worrisome for are further airspace disease. Relative sparing in the aerated portions of the right lung. Musculoskeletal: There is asymmetric soft tissue edema across the left chest wall. No clear extension of the pleural or airspace processes in to the chest wall proper. No acute or worrisome osseous lesions. Heterogeneously dense breast tissue without focal breast nodularity or masses. Few benign macro calcifications. Multilevel degenerative changes in the spine and shoulders. Dextrocurvature of the thoracic spine. CT ABDOMEN PELVIS FINDINGS Hepatobiliary: Few scattered subcentimeter hypoattenuating foci are present in the liver, largest in the left lobe measuring up to 8 mm (3/51). Too small to fully characterize on CT imaging. No other focal concerning liver  lesions. Gallbladder is unremarkable. No pericholecystic fluid or inflammation. No visible intraductal gallstones or biliary dilatation. Pancreas: No pancreatic ductal dilatation or surrounding inflammatory changes. Spleen: Few punctate hypoattenuating foci in the spleen are too small to characterize. No worrisome focal splenic lesions. Normal splenic size. Adrenals/Urinary Tract: No concerning adrenal nodules or masses. Multiple fluid attenuation cysts are present in both kidneys, largest are present in the upper pole left kidney measuring up to 7.4 cm with some thin peripheral mural calcification (Bosniak 2). No concerning focal renal lesions. No urolithiasis or hydronephrosis. Urinary bladder is largely decompressed at the time of exam and therefore poorly evaluated by CT imaging. No gross bladder abnormality. Stomach/Bowel: Mild thickening at the gastric antrum is likely related to normal peristaltic motion. Duodenum is unremarkable. High attenuation enteric contrast material partially traverses the colon. No small bowel thickening or dilatation. No proximal colonic abnormalities are identified. Some questionable thickening of the rectosigmoid though may be related to underdistention. However some perirectal fat stranding is present which is nonspecific. No evidence of obstruction. Appendix is not visualized. No focal inflammation the vicinity of the cecum to suggest an occult appendicitis. Vascular/Lymphatic: Atherosclerotic calcifications within the abdominal aorta and branch vessels. No aneurysm or ectasia. No enlarged abdominopelvic lymph nodes. Reproductive: Patient appears to be post hysterectomy. No concerning adnexal lesion. Other: Mild presacral fat stranding and trace fluid in the pelvis, possibly reactive fluid status or reactive change. No free air. Mild body wall edema most pronounced over the left flank and hip. No bowel containing hernias. Musculoskeletal: Multilevel degenerative changes are  present in the imaged portions of the spine. Stepwise retrolisthesis L1-L4. Levocurvature of the lumbar spine. No spondylolysis. Additional degenerative changes in the hips and pelvis. No acute osseous abnormality or suspicious osseous lesion. Sclerotic focus in the right ilium (6/53) is likely bone island. IMPRESSION: 1. Moderate left and small right pleural effusions. The left pleural effusion demonstrates areas of conspicuous pleural thickening and small pleural nodularity. While this could reflect empyema, a malignant effusion with pleural  deposits would present similarly. Furthermore, there is heterogeneous enhancement of the adjacent atelectatic lung parenchyma including within the lingula, anterior segment left upper lobe and anterior basal segment of the left Shelley lobe. Could reflect underlying airspace disease or malignancy with additional airspace disease seen throughout aerated portions of the left lung. 2. Asymmetric soft tissue edema across the left chest wall. No clear extension of the pleural or airspace processes in to the chest wall proper. Possibly reactive though should with exam findings and continued attention on follow-up imaging is recommended. 3. Some questionable thickening of the rectosigmoid though may be related to underdistention though some mild perirectal fat stranding is present however and could suggest an underlying proctitis. 4. Aortic Atherosclerosis (ICD10-I70.0). Currently attempting to contact the ordering provider with a critical value result. Addendum will be submitted upon case discussion. Electronically Signed: By: Lovena Le M.D. On: 05/02/2020 04:04   DG CHEST PORT 1 VIEW  Result Date: 05/11/2020 CLINICAL DATA:  Shortness of breath with cough EXAM: PORTABLE CHEST 1 VIEW COMPARISON:  May 10, 2020 FINDINGS: Left drainage catheter present medially with interval drainage of most of the pleural effusion compared to 1 day prior. Small amount of residual effusion  remains with atelectatic change in the left mid Shelley lung regions. Apparent consolidation abutting the left hilum is present. On the right, there is a small pleural effusion with slight right base atelectasis. Heart is upper normal in size with pulmonary vascularity normal. No adenopathy no bone lesions. IMPRESSION: Most of pleural fluid drain on the left following drainage catheter placement. No pneumothorax. Consolidation overlying the left hilum/perihilar region again noted. Stable cardiac silhouette. Small right pleural effusion with mild right base atelectasis. Right lung otherwise clear. Electronically Signed   By: Lowella Grip III M.D.   On: 05/11/2020 08:04   DG CHEST PORT 1 VIEW  Result Date: 05/10/2020 CLINICAL DATA:  Short of breath EXAM: PORTABLE CHEST 1 VIEW COMPARISON:  05/09/2020 FINDINGS: Extensive consolidation left Shelley lobe with mild progression, with associated pleural effusion. No pneumothorax Mild right Shelley lobe airspace disease and small right effusion unchanged. Negative for heart failure or edema. IMPRESSION: Extensive consolidation left Shelley lobe with mild progression. Left pleural effusion not well visualized due to consolidation. Mild right Shelley lobe airspace disease unchanged. Electronically Signed   By: Franchot Gallo M.D.   On: 05/10/2020 08:07   DG CHEST PORT 1 VIEW  Result Date: 05/09/2020 CLINICAL DATA:  Shortness of breath. EXAM: PORTABLE CHEST 1 VIEW COMPARISON:  Chest x-ray 05/06/2020 and CT chest 05/05/2020. FINDINGS: Stable left hilar mass with progressive surrounding airspace consolidation and near complete left Shelley lobe atelectasis. Suspect enlarging left pleural effusion. Stable small right pleural effusion. The right lung remains relatively clear. IMPRESSION: Stable left hilar mass with progressive surrounding airspace consolidation and near complete left Shelley lobe/lingular atelectasis. Suspect enlarging left pleural effusion. Electronically Signed    By: Marijo Sanes M.D.   On: 05/09/2020 09:25   DG CHEST PORT 1 VIEW  Result Date: 05/05/2020 CLINICAL DATA:  SOB. Hx of valvular heart disease and HTN EXAM: PORTABLE CHEST 1 VIEW COMPARISON:  05/04/2020 FINDINGS: Normal cardiac silhouette. Dense streaky LEFT lobe opacity increased from prior. Obscuration of the heart border. Small LEFT effusion. RIGHT lung is clear. Lungs are hyperinflated. No pneumothorax IMPRESSION: LEFT Shelley lobe and lingular pneumonia and probable effusion. Electronically Signed   By: Suzy Bouchard M.D.   On: 05/05/2020 09:04   DG CHEST PORT 1 VIEW  Result Date:  05/04/2020 CLINICAL DATA:  Shortness of breath.  Pleural effusion. EXAM: PORTABLE CHEST 1 VIEW COMPARISON:  May 03, 2020. FINDINGS: Increased size of a large left pleural effusion. Overlying left opacities. Similar versus slightly increased small right pleural effusion. No visible pneumothorax. Cardiac silhouette is largely obscured. No acute osseous abnormality. IMPRESSION: 1. Increased large left pleural effusion. Overlying left opacities may represent pneumonia and/or atelectasis. 2. Similar versus slightly increased small right pleural effusion. Electronically Signed   By: Margaretha Sheffield MD   On: 05/04/2020 08:10   DG Chest Portable 1 View  Result Date: 05/01/2020 CLINICAL DATA:  Shortness of breath, status post thoracentesis. EXAM: PORTABLE CHEST 1 VIEW COMPARISON:  Chest x-ray 05/01/2020 FINDINGS: The heart size and mediastinal contours are not well visualized due to overlying pleural effusion and pulmonary changes. These airspace opacity of the left lung. No pulmonary edema. Interval decrease in a now small to moderate volume left pleural effusion. Persistent trace right pleural effusion. No pneumothorax. No acute osseous abnormality. IMPRESSION: 1. Interval decrease in size of a now small to moderate volume left pleural effusion. Underlying infection, inflammation, pulmonary mass not excluded. 2. Trace  right pleural effusion. Electronically Signed   By: Iven Finn M.D.   On: 05/01/2020 20:50   ECHOCARDIOGRAM COMPLETE  Result Date: 05/04/2020    ECHOCARDIOGRAM REPORT   Patient Name:   Shelley [facility] Date of Exam: 05/04/2020 Medical Rec #:  [id number]          Height:       64.0 in Accession #:    [id number]         Weight:       142.6 lb Date of Birth:  06-26-1932           BSA:          1.695 m Patient Age:    55 years           BP:           150/66 mmHg Patient Gender: F                  HR:           88 bpm. Exam Location:  Inpatient Procedure: 2D Echo, Cardiac Doppler and Color Doppler Indications:    Dyspnea  History:        Patient has prior history of Echocardiogram examinations, most                 recent 03/27/2018. Signs/Symptoms:Shortness of Breath; Risk                 Factors:Hypertension, Dyslipidemia and Former Smoker. Large left                 pleural effusion.  Sonographer:    Dustin Flock Referring Phys: [id number] MURALI RAMASWAMY  Sonographer Comments: Technically challenging study due to limited acoustic windows. Difficult windows due to large pleural effusion. IMPRESSIONS  1. Left ventricular ejection fraction, by estimation, is 60 to 65%. The left ventricle has normal function. The left ventricle has no regional wall motion abnormalities. Left ventricular diastolic parameters are consistent with Grade I diastolic dysfunction (impaired relaxation). Elevated left ventricular end-diastolic pressure.  2. Right ventricular systolic function is normal. The right ventricular size is normal.  3. The mitral valve is degenerative. No evidence of mitral valve regurgitation. No evidence of mitral stenosis. Moderate mitral annular calcification.  4. The aortic valve is normal in structure. Aortic valve regurgitation is not  visualized. No aortic stenosis is present.  5. The inferior vena cava is normal in size with greater than 50% respiratory variability, suggesting right atrial pressure of 3  mmHg. FINDINGS  Left Ventricle: Left ventricular ejection fraction, by estimation, is 60 to 65%. The left ventricle has normal function. The left ventricle has no regional wall motion abnormalities. The left ventricular internal cavity size was normal in size. There is  no left ventricular hypertrophy. Left ventricular diastolic parameters are consistent with Grade I diastolic dysfunction (impaired relaxation). Elevated left ventricular end-diastolic pressure. Right Ventricle: The right ventricular size is normal. No increase in right ventricular wall thickness. Right ventricular systolic function is normal. Left Atrium: Left atrial size was normal in size. Right Atrium: Right atrial size was normal in size. Pericardium: There is no evidence of pericardial effusion. Mitral Valve: The mitral valve is degenerative in appearance. Moderate mitral annular calcification. No evidence of mitral valve regurgitation. No evidence of mitral valve stenosis. Tricuspid Valve: The tricuspid valve is normal in structure. Tricuspid valve regurgitation is not demonstrated. No evidence of tricuspid stenosis. Aortic Valve: The aortic valve is normal in structure. Aortic valve regurgitation is not visualized. No aortic stenosis is present. Pulmonic Valve: The pulmonic valve was normal in structure. Pulmonic valve regurgitation is not visualized. No evidence of pulmonic stenosis. Aorta: The aortic root is normal in size and structure. Venous: The inferior vena cava is normal in size with greater than 50% respiratory variability, suggesting right atrial pressure of 3 mmHg. IAS/Shunts: No atrial level shunt detected by color flow Doppler.  LEFT VENTRICLE PLAX 2D LVIDd:         3.30 cm Diastology LVIDs:         1.60 cm LV e' medial:    4.03 cm/s LV PW:         1.00 cm LV E/e' medial:  19.9 LV IVS:        1.00 cm LV e' lateral:   5.00 cm/s                        LV E/e' lateral: 16.0  RIGHT VENTRICLE RV Basal diam:  2.20 cm RV S prime:      2.50 cm/s TAPSE (M-mode): 2.0 cm LEFT ATRIUM             Index       RIGHT ATRIUM          Index LA Vol (A2C):   26.5 ml 15.64 ml/m RA Area:     8.27 cm LA Vol (A4C):   23.7 ml 13.99 ml/m RA Volume:   15.00 ml 8.85 ml/m LA Biplane Vol: 26.8 ml 15.82 ml/m  AORTIC VALVE LVOT Vmax:   120.00 cm/s LVOT Vmean:  84.000 cm/s LVOT VTI:    0.220 m  AORTA Ao Root diam: 2.60 cm MITRAL VALVE MV Area (PHT): 3.60 cm     SHUNTS MV Decel Time: 211 msec     Systemic VTI: 0.22 m MV E velocity: 80.20 cm/s MV A velocity: 127.00 cm/s MV E/A ratio:  0.63 Skeet Latch MD Electronically signed by Skeet Latch MD Signature Date/Time: 05/04/2020/11:23:20 AM    Final    VAS [place] Shelley EXTREMITY VENOUS (DVT)  Result Date: 05/04/2020  Shelley Venous DVT Study Indications: Dyspnea and fatigue x 10 days.  Risk Factors: Recent pleural effusion and thoracentesis w/ suspected malignancy. Performing Technologist: Rogelia Rohrer  Examination Guidelines: A complete evaluation includes B-mode imaging, spectral Doppler, color  Doppler, and power Doppler as needed of all accessible portions of each vessel. Bilateral testing is considered an integral part of a complete examination. Limited examinations for reoccurring indications may be performed as noted. The reflux portion of the exam is performed with the patient in reverse Trendelenburg.  +---------+---------------+---------+-----------+----------+--------------+ RIGHT    CompressibilityPhasicitySpontaneityPropertiesThrombus Aging +---------+---------------+---------+-----------+----------+--------------+ CFV      Full           Yes      Yes                                 +---------+---------------+---------+-----------+----------+--------------+ SFJ      Full                                                        +---------+---------------+---------+-----------+----------+--------------+ FV Prox  Full           Yes      Yes                                  +---------+---------------+---------+-----------+----------+--------------+ FV Mid   Full           Yes      Yes                                 +---------+---------------+---------+-----------+----------+--------------+ FV DistalFull           Yes      Yes                                 +---------+---------------+---------+-----------+----------+--------------+ PFV      Full                                                        +---------+---------------+---------+-----------+----------+--------------+ POP      Full           Yes      Yes                                 +---------+---------------+---------+-----------+----------+--------------+ PTV      Full                                                        +---------+---------------+---------+-----------+----------+--------------+ PERO     Full                                                        +---------+---------------+---------+-----------+----------+--------------+   +---------+---------------+---------+-----------+----------+--------------+ LEFT     CompressibilityPhasicitySpontaneityPropertiesThrombus Aging +---------+---------------+---------+-----------+----------+--------------+ CFV  Full           Yes      Yes                                 +---------+---------------+---------+-----------+----------+--------------+ SFJ      Full                                                        +---------+---------------+---------+-----------+----------+--------------+ FV Prox  Full           Yes      Yes                                 +---------+---------------+---------+-----------+----------+--------------+ FV Mid   Full           Yes      Yes                                 +---------+---------------+---------+-----------+----------+--------------+ FV DistalFull           Yes      Yes                                  +---------+---------------+---------+-----------+----------+--------------+ PFV      Full                                                        +---------+---------------+---------+-----------+----------+--------------+ POP      Full           Yes      Yes                                 +---------+---------------+---------+-----------+----------+--------------+ PTV      Full                                                        +---------+---------------+---------+-----------+----------+--------------+ PERO     Full                                                        +---------+---------------+---------+-----------+----------+--------------+     Summary: RIGHT: - There is no evidence of deep vein thrombosis in the Shelley extremity.  - No cystic structure found in the popliteal fossa.  LEFT: - There is no evidence of deep vein thrombosis in the Shelley extremity.  - No cystic structure found in the popliteal fossa.  *See table(s) above for measurements and observations. Electronically signed by Monica Martinez MD on 05/04/2020 at 7:55:52 PM.    Final  IR THORACENTESIS ASP PLEURAL SPACE W/IMG GUIDE  Result Date: 05/04/2020 INDICATION: Patient with history of acute hypoxic respiratory failure secondary to recurrent left pleural effusion. Request is made for diagnostic and therapeutic left thoracentesis. EXAM: ULTRASOUND GUIDED DIAGNOSTIC AND THERAPEUTIC THORACENTESIS MEDICATIONS: 10 mL 2% lidocaine COMPLICATIONS: None immediate. PROCEDURE: An ultrasound guided thoracentesis was thoroughly discussed with the patient and questions answered. The benefits, risks, alternatives and complications were also discussed. The patient understands and wishes to proceed with the procedure. Written consent was obtained. Ultrasound was performed to localize and mark an adequate pocket of fluid in the left chest. The area was then prepped and draped in the normal sterile fashion. 1% Lidocaine was  used for local anesthesia. Under ultrasound guidance a 6 Fr Safe-T-Centesis catheter was introduced. Thoracentesis was performed. The catheter was removed and a dressing applied. FINDINGS: A total of approximately 1.55 L of hazy amber fluid was removed. Samples were sent to the laboratory as requested by the clinical team. IMPRESSION: Successful ultrasound guided left thoracentesis yielding 1.55 L of pleural fluid. Read by: Earley Abide, PA-C Electronically Signed   By: Markus Daft M.D.   On: 05/04/2020 15:12   IR PERC PLEURAL DRAIN W/INDWELL CATH W/IMG GUIDE  Result Date: 05/10/2020 CLINICAL DATA:  Recurrent malignant left pleural effusion and need for tunneled pleural drainage catheter prior to discharge from the [facility]. EXAM: INSERTION OF TUNNELED PLEURAL DRAINAGE CATHETER ANESTHESIA/SEDATION: 1.0 mg IV Versed; 50 mcg IV Fentanyl. Total Moderate Sedation Time: 27 minutes. The patient's level of consciousness and physiologic status were continuously monitored during the procedure by Radiology nursing. MEDICATIONS: 2 g IV Ancef. Antibiotic was administered in an appropriate time interval for the procedure. FLUOROSCOPY TIME:  6 seconds.  1.0 mGy. PROCEDURE: The procedure, risks, benefits, and alternatives were explained to the patient. Questions regarding the procedure were encouraged and answered. The patient understands and consents to the procedure. A time-out was performed prior to initiating the procedure. The left chest wall was prepped with chlorhexidine in a sterile fashion, and a sterile drape was applied covering the operative field. A sterile gown and sterile gloves were used for the procedure. Local anesthesia was provided with 1% Lidocaine. Ultrasound image documentation was performed. Fluoroscopy during the procedure and fluoroscopic spot radiograph confirms appropriate catheter position. After creating a small skin incision, a 19 gauge needle was advanced into the pleural cavity under  ultrasound guidance. A guide wire was then advanced under fluoroscopy into the pleural space. Pleural access was dilated serially and a 16-French peel-away sheath placed. A 15.5 French tunneled PleurX catheter was placed. This was tunneled from an incision 5 cm below the pleural access to the access site. The catheter was advanced through the peel-away sheath. The sheath was then removed. Final catheter positioning was confirmed with a fluoroscopic spot image. The access incision was closed with subcutaneous and subcuticular 4-0 Vicryl. Dermabond was applied to the incision. A Prolene retention suture was applied at the catheter exit site. Thoracentesis was then performed via a vacuum bottle. COMPLICATIONS: None. FINDINGS: The catheter was placed via the left Shelley lateral chest wall. Approximately 1.1 liters of pleural fluid was able to be removed after catheter placement. IMPRESSION: Placement of permanent, tunneled left pleural drainage catheter via lateral approach. 1.1 liters of pleural fluid was removed today after catheter placement. Electronically Signed   By: Aletta Edouard M.D.   On: 05/10/2020 14:15    Assessment/Plan There are no diagnoses linked to this encounter.   Family/ staff Communication:  Labs/tests ordered:

## 2020-05-22 NOTE — Assessment & Plan Note (Signed)
Hypokalemia, corrected, K 3.4 05/17/20, taking Kcl, pending BMP

## 2020-05-22 NOTE — Assessment & Plan Note (Signed)
SOB/mild valvular heart disease

## 2020-05-22 NOTE — Assessment & Plan Note (Signed)
GERD, takes Omeprazole, poor appetite, early satiety, f/u oncology eval for mass.

## 2020-05-22 NOTE — Assessment & Plan Note (Signed)
Hyperlipidemia, takes Atorvastatin

## 2020-05-22 NOTE — Assessment & Plan Note (Signed)
Edema BL BNP normal, EF 60-65%, negative DVT venous US. Takes Furosemide 20mg  qd since 05/17/20,  Lost #5Ibs in the past 5 days(fluid vs true weight loss?), Bun/creat 16/0.9 05/17/20. The patient desires more diuresis due to persisted BLE edema, will increase Torsemide 20mg  bid x 3 days, then 20mg  qd, f/u BMP

## 2020-05-22 NOTE — Progress Notes (Signed)
Synopsis: Referred in January 2022 for lung cancer by Cari Caraway, MD  Subjective:   PATIENT ID: Shelley Flores GENDER: female DOB: Nov 26, 1932, MRN: [id number]  Chief Complaint  Patient presents with  . Hospitalization Follow-up    Here for F/U of PE    This is an 87 year old female medical history of tobacco abuse, hypertension, hyperlipidemia, pleural effusion.  Patient had a recent hospitalization diagnosed with a stage IV lung cancer.  Had increasing dyspnea.  Patient underwent thoracentesis pleural fluid consistent with malignant pleural effusion stage IV adenocarcinoma.  Patient ultimately stayed in the hospital and had a tunneled indwelling pleural catheter placed..  Patient was discharged on 05/11/2020.  Discharge summary reviewed.  Patient ultimately had office visit with medical oncology on 05/17/2020, Dr. Earlie Server.  Office note reviewed new diagnosis of stage IV T2b, N2, M1 A non-small cell lung cancer.  She had blood test sent for guardant 360 which we are currently waiting on.  She has a follow-up appointment scheduled with him as well.  Today presents to the office for evaluation of malignant pleural effusion status post indwelling pleural catheter placement.  Currently draining every day with approximately 100-150 cc of amber fluid.    Past Medical History:  Diagnosis Date  . Bruit    Abdominal bruit - Abdominal aorta/renal duplex Doppler evaluation 12/07/03 -Mildly abnormal evaluation. 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 This is consistant w/median arcuate ligament compression syndrome. *Right & Left Kidney: Essentially equal in size, symmetrical in shape w/no significant abnormalities visualized. *Right & Left Renal Arteries: No significant abnormalities.  . Edema extremities    LE edema   . H/O myocardial perfusion scan 02/20/00   To rule out ischemia - Negative adequate Bruce protocol exercise stress test with a deconditioned exercise response and  normal static myocardial perfusion images with EF calculated by QGS of 77%. Represents a low risk study.  . Hyperlipidemia   . Hypertension   . Osteoarthritis   . Osteoporosis   . Pleural effusion 04/2020  . Valvular heart disease    Mild valvular heart disease by Echo in 2010, all mild and symptomatic, including concentric LVH, MR, TR, and AI with pulmonary artery pressure of 32. EF was normal.     Family History  Problem Relation Age of Onset  . Leukemia Mother 16  . Cancer Father        esophagial cancer  . Heart failure Maternal Grandmother   . Tuberculosis Maternal Grandfather   . Heart disease Paternal Grandmother   . Cancer Paternal Grandfather      Past Surgical History:  Procedure Laterality Date  . Abdominal aorta/Renal duplex Doppler Evaluation  12/07/03   For abdominal bruit. Mildly abnormal evaluation. (See bruit in medical history)  . BREAST EXCISIONAL BIOPSY Left 1966  . BREAST EXCISIONAL BIOPSY Left 1999  . CATARACT EXTRACTION  2003  . COLONOSCOPY  10/22/2004  . DEXA Bone Scan  06/23/2013  . IR PERC PLEURAL DRAIN W/INDWELL CATH W/IMG GUIDE  05/10/2020  . IR THORACENTESIS ASP PLEURAL SPACE W/IMG GUIDE  05/04/2020    Social History   Socioeconomic History  . Marital status: Single    Spouse name: Not on file  . Number of children: Not on file  . Years of education: Not on file  . Highest education level: Not on file  Occupational History  . Occupation: Retired    Fish farm manager: [employer]    Comment: Chemistry professor  Tobacco Use  . Smoking status:  Former Smoker    Packs/day: 0.25    Years: 6.00    Pack years: 1.50    Types: Cigarettes    Quit date: 05/13/1962    Years since quitting: 58.0  . Smokeless tobacco: Never Used  Vaping Use  . Vaping Use: Never used  Substance and Sexual Activity  . Alcohol use: Yes    Alcohol/week: 2.0 standard drinks    Types: 2 Standard drinks or equivalent per week    Comment: eine  . Drug use: No  . Sexual  activity: Not on file  Other Topics Concern  . Not on file  Social History Narrative  . Not on file   Social Determinants of Health   Financial Resource Strain: Not on file  Food Insecurity: Not on file  Transportation Needs: Not on file  Physical Activity: Not on file  Stress: Not on file  Social Connections: Not on file  Intimate Partner Violence: Not on file     Allergies  Allergen Reactions  . Irbesartan Nausea Only     Outpatient Medications Prior to Visit  Medication Sig Dispense Refill  . acetaminophen (TYLENOL) 325 MG tablet Take 2 tablets (650 mg total) by mouth every 6 (six) hours as needed for mild pain (or Fever >/= 101). 20 tablet 0  . aspirin 81 MG tablet Take 81 mg by mouth daily.    [name] atorvastatin (LIPITOR) 20 MG tablet TAKE 1 TABLET BY MOUTH EVERY DAY (Patient taking differently: Take 20 mg by mouth at bedtime.) 90 tablet 1  . furosemide (LASIX) 20 MG tablet Take 20 mg by mouth daily.    . metoprolol succinate (TOPROL-XL) 50 MG 24 hr tablet Take 50 mg by mouth daily. Take with or immediately following a meal.    . Multiple Vitamin (MULTIVITAMIN) tablet Take 1 tablet by mouth daily.    [name] omeprazole (PRILOSEC) 20 MG capsule Take 20 mg by mouth 2 (two) times daily.    . potassium chloride SA (KLOR-CON) 20 MEQ tablet Take 20 mEq by mouth every morning.    . potassium chloride SA (KLOR-CON) 20 MEQ tablet Take 20 mEq by mouth 2 (two) times daily.    [name] zinc oxide 20 % ointment Apply 1 application topically as needed for irritation. apply to buttocks/peri, topical, As Needed, To buttocks after every incontinent episode and as needed for redness. May keep at bedside.     No facility-administered medications prior to visit.    Review of Systems  Constitutional: Negative for chills, fever, malaise/fatigue and weight loss.  HENT: Negative for hearing loss, sore throat and tinnitus.   Eyes: Negative for blurred vision and double vision.  Respiratory: Positive for cough  and shortness of breath. Negative for hemoptysis, sputum production, wheezing and stridor.   Cardiovascular: Negative for chest pain, palpitations, orthopnea, leg swelling and PND.  Gastrointestinal: Negative for abdominal pain, constipation, diarrhea, heartburn, nausea and vomiting.  Genitourinary: Negative for dysuria, hematuria and urgency.  Musculoskeletal: Negative for joint pain and myalgias.  Skin: Negative for itching and rash.  Neurological: Negative for dizziness, tingling, weakness and headaches.  Endo/Heme/Allergies: Negative for environmental allergies. Does not bruise/bleed easily.  Psychiatric/Behavioral: Negative for depression. The patient is not nervous/anxious and does not have insomnia.   All other systems reviewed and are negative.    Objective:  Physical Exam Vitals reviewed.  Constitutional:      General: She is not in acute distress.    Appearance: She is well-developed.     Comments:  Thin frail cachectic  HENT:     Head: Normocephalic and atraumatic.     Mouth/Throat:     Mouth: Oropharynx is clear and moist.     Pharynx: No oropharyngeal exudate.  Eyes:     Extraocular Movements: EOM normal.     Conjunctiva/sclera: Conjunctivae normal.     Pupils: Pupils are equal, round, and reactive to light.  Neck:     Vascular: No JVD.     Trachea: No tracheal deviation.     Comments: Loss of supraclavicular fat Cardiovascular:     Rate and Rhythm: Normal rate and regular rhythm.     Pulses: Intact distal pulses.     Heart sounds: S1 normal and S2 normal.     Comments: Distant heart tones Pulmonary:     Effort: No tachypnea or accessory muscle usage.     Breath sounds: No stridor. Decreased breath sounds (throughout all lung fields) present. No wheezing, rhonchi or rales.     Comments: Increased AP chest diameter Abdominal:     General: Bowel sounds are normal. There is no distension.     Palpations: Abdomen is soft.     Tenderness: There is no abdominal  tenderness.  Musculoskeletal:        General: Deformity (muscle wasting ) present. No edema.  Skin:    General: Skin is warm and dry.     Capillary Refill: Capillary refill takes less than 2 seconds.     Findings: No rash.  Neurological:     Mental Status: She is alert and oriented to person, place, and time.  Psychiatric:        Mood and Affect: Mood and affect normal.        Behavior: Behavior normal.      Vitals:   05/22/20 1357  BP: 122/72  Pulse: 96  Temp: (!) 97 F (36.1 C)  TempSrc: Tympanic  SpO2: 97%  Weight: 125 lb (56.7 kg)  Height: _0  (1.626 m)   97% on RA BMI Readings from Last 3 Encounters:  05/22/20 21.46 kg/m  05/22/20 21.46 kg/m  05/17/20 22.14 kg/m   Wt Readings from Last 3 Encounters:  05/22/20 125 lb (56.7 kg)  05/22/20 125 lb (56.7 kg)  05/17/20 129 lb (58.5 kg)     CBC    Component Value Date/Time   WBC 13.2 (H) 05/17/2020 1300   WBC 11.1 (H) 05/11/2020 0246   RBC 5.08 05/17/2020 1300   HGB 15.2 (H) 05/17/2020 1300   HCT 44.9 05/17/2020 1300   PLT 370 05/17/2020 1300   MCV 88.4 05/17/2020 1300   MCH 29.9 05/17/2020 1300   MCHC 33.9 05/17/2020 1300   RDW 14.8 05/17/2020 1300   LYMPHSABS 1.1 05/17/2020 1300   MONOABS 0.9 05/17/2020 1300   EOSABS 0.2 05/17/2020 1300   BASOSABS 0.1 05/17/2020 1300    Chest Imaging: Chest x-ray: Pleural drainage catheter in place minimal effusion. The patient's images have been independently reviewed by me.    Pulmonary Functions Testing Results: No flowsheet data found.  FeNO:   Pathology:   December 2021: Pleural fluid Adenocarcinoma  Echocardiogram:   Heart Catheterization:     Assessment & Plan:     ICD-10-CM   1. Mass of upper lobe of left lung  R91.8   2. Malignant neoplasm of left lung, unspecified part of lung (HCC)  C34.92   3. Malignant pleural effusion  J91.0   4. Chest tube in place  Z96.89     Discussion:  This is an 87 year old female, recent diagnosis of  stage IV adenocarcinoma of the lung.  She has established care with medical oncology.  A indwelling pleural catheter, IPC, was placed by interventional radiology on her last hospital admission.  She is here to follow-up in clinic regarding management of IPC.  Site was examined today no evidence of infection or erythema.  Patient was drained today in the office with pleural access kit.  There was approximately 100 cc of amber fluid collected.  Plan: Recommend dropping down to drainage every other day or considerations for Monday Wednesday Friday. Recommend her facility to continue to observe the tube insertion site for any sign of infection. Orders and paperwork filled out for [facility].  Additional time spent reviewing pathology reports, and documentation from recent hospitalization.   Current Outpatient Medications:  .  acetaminophen (TYLENOL) 325 MG tablet, Take 2 tablets (650 mg total) by mouth every 6 (six) hours as needed for mild pain (or Fever >/= 101)., Disp: 20 tablet, Rfl: 0 .  aspirin 81 MG tablet, Take 81 mg by mouth daily., Disp: , Rfl:  .  atorvastatin (LIPITOR) 20 MG tablet, TAKE 1 TABLET BY MOUTH EVERY DAY (Patient taking differently: Take 20 mg by mouth at bedtime.), Disp: 90 tablet, Rfl: 1 .  furosemide (LASIX) 20 MG tablet, Take 20 mg by mouth daily., Disp: , Rfl:  .  metoprolol succinate (TOPROL-XL) 50 MG 24 hr tablet, Take 50 mg by mouth daily. Take with or immediately following a meal., Disp: , Rfl:  .  Multiple Vitamin (MULTIVITAMIN) tablet, Take 1 tablet by mouth daily., Disp: , Rfl:  .  omeprazole (PRILOSEC) 20 MG capsule, Take 20 mg by mouth 2 (two) times daily., Disp: , Rfl:  .  potassium chloride SA (KLOR-CON) 20 MEQ tablet, Take 20 mEq by mouth every morning., Disp: , Rfl:  .  potassium chloride SA (KLOR-CON) 20 MEQ tablet, Take 20 mEq by mouth 2 (two) times daily., Disp: , Rfl:  .  zinc oxide 20 % ointment, Apply 1 application topically as needed  for irritation. apply to buttocks/peri, topical, As Needed, To buttocks after every incontinent episode and as needed for redness. May keep at bedside., Disp: , Rfl:   I spent 60 minutes dedicated to the care of this patient on the date of this encounter to include pre-visit review of records, face-to-face time with the patient discussing conditions above, post visit ordering of testing, clinical documentation with the electronic health record, making appropriate referrals as documented, and communicating necessary findings to members of the patients care team.   Garner Nash, DO [facility] 05/22/2020 2:30 PM

## 2020-05-22 NOTE — Assessment & Plan Note (Signed)
Hyponatremia, corrected, Na 136 05/17/20

## 2020-05-22 NOTE — Assessment & Plan Note (Addendum)
Hypophosphatemia, normalized P 3.3, Mg 2.2 05/22/20

## 2020-05-22 NOTE — Assessment & Plan Note (Signed)
Hospitalized 05/01/20-05/11/20 for a large left pleural effusion, s/p thoracentesis x2, drained 1.5L, Cytology showed adenocarcinoma, CT showed left upper lobe mass invades the mediastinum at the level of the aortic or pulmonayr window, concerning for lymphangitic spread of disease, pleurX placed, drain prn daily up to 1L/day, call for less than 142ml daily x 3 consecutive drains qod for possible removal of the catheter,  f/u pulmonology Dr. Valeta Harms 05/22/20, oncology Dr. Julien Nordmann 05/17/20 wbc 13.2 Hgb 15.2, neutrophils 81% 05/17/20, the treatment options would likely consist of palliative chemotherapy/immunotherapy.

## 2020-05-22 NOTE — Telephone Encounter (Signed)
Scheduled appts per 1/5 los. Representative from [facility] confirmed appt date and time for pt.

## 2020-05-22 NOTE — Assessment & Plan Note (Signed)
HTN, takes Metoprolol.

## 2020-05-22 NOTE — Patient Instructions (Addendum)
Thank you for visiting Dr. Valeta Harms at [facility]. Today we recommend the following:  Call me with any concerns about tube insertion site.   Can move to draining every other day or MWF.  Return in about 3 months (around 08/20/2020) for with APP or Dr. Valeta Harms.    Please do your part to reduce the spread of COVID-19.

## 2020-05-23 ENCOUNTER — Encounter: Payer: Self-pay | Admitting: Nurse Practitioner

## 2020-05-25 LAB — BASIC METABOLIC PANEL
BUN: 12 (ref 4–21)
CO2: 23 — AB (ref 13–22)
Chloride: 103 (ref 99–108)
Creatinine: 0.8 (ref 0.5–1.1)
Glucose: 83
Potassium: 5 (ref 3.4–5.3)
Sodium: 137 (ref 137–147)

## 2020-05-25 LAB — COMPREHENSIVE METABOLIC PANEL: Calcium: 9.9 (ref 8.7–10.7)

## 2020-05-26 DIAGNOSIS — K219 Gastro-esophageal reflux disease without esophagitis: Secondary | ICD-10-CM | POA: Diagnosis not present

## 2020-05-26 DIAGNOSIS — R2681 Unsteadiness on feet: Secondary | ICD-10-CM | POA: Diagnosis not present

## 2020-05-26 DIAGNOSIS — R1310 Dysphagia, unspecified: Secondary | ICD-10-CM | POA: Diagnosis not present

## 2020-05-26 DIAGNOSIS — R1319 Other dysphagia: Secondary | ICD-10-CM | POA: Diagnosis not present

## 2020-05-26 DIAGNOSIS — J9610 Chronic respiratory failure, unspecified whether with hypoxia or hypercapnia: Secondary | ICD-10-CM | POA: Diagnosis not present

## 2020-05-26 DIAGNOSIS — R058 Other specified cough: Secondary | ICD-10-CM | POA: Diagnosis not present

## 2020-05-26 DIAGNOSIS — J90 Pleural effusion, not elsewhere classified: Secondary | ICD-10-CM | POA: Diagnosis not present

## 2020-05-26 DIAGNOSIS — E876 Hypokalemia: Secondary | ICD-10-CM | POA: Diagnosis not present

## 2020-05-26 DIAGNOSIS — M6281 Muscle weakness (generalized): Secondary | ICD-10-CM | POA: Diagnosis not present

## 2020-05-29 ENCOUNTER — Ambulatory Visit (HOSPITAL_COMMUNITY): Payer: Medicare PPO

## 2020-05-29 DIAGNOSIS — R1319 Other dysphagia: Secondary | ICD-10-CM | POA: Diagnosis not present

## 2020-05-29 DIAGNOSIS — K219 Gastro-esophageal reflux disease without esophagitis: Secondary | ICD-10-CM | POA: Diagnosis not present

## 2020-05-29 DIAGNOSIS — M6281 Muscle weakness (generalized): Secondary | ICD-10-CM | POA: Diagnosis not present

## 2020-05-29 DIAGNOSIS — E876 Hypokalemia: Secondary | ICD-10-CM | POA: Diagnosis not present

## 2020-05-29 DIAGNOSIS — J9610 Chronic respiratory failure, unspecified whether with hypoxia or hypercapnia: Secondary | ICD-10-CM | POA: Diagnosis not present

## 2020-05-29 DIAGNOSIS — R2681 Unsteadiness on feet: Secondary | ICD-10-CM | POA: Diagnosis not present

## 2020-05-29 DIAGNOSIS — R058 Other specified cough: Secondary | ICD-10-CM | POA: Diagnosis not present

## 2020-05-29 DIAGNOSIS — R1310 Dysphagia, unspecified: Secondary | ICD-10-CM | POA: Diagnosis not present

## 2020-05-29 DIAGNOSIS — J90 Pleural effusion, not elsewhere classified: Secondary | ICD-10-CM | POA: Diagnosis not present

## 2020-05-30 DIAGNOSIS — E876 Hypokalemia: Secondary | ICD-10-CM | POA: Diagnosis not present

## 2020-05-30 DIAGNOSIS — R1319 Other dysphagia: Secondary | ICD-10-CM | POA: Diagnosis not present

## 2020-05-30 DIAGNOSIS — M6281 Muscle weakness (generalized): Secondary | ICD-10-CM | POA: Diagnosis not present

## 2020-05-30 DIAGNOSIS — R2681 Unsteadiness on feet: Secondary | ICD-10-CM | POA: Diagnosis not present

## 2020-05-30 DIAGNOSIS — J9610 Chronic respiratory failure, unspecified whether with hypoxia or hypercapnia: Secondary | ICD-10-CM | POA: Diagnosis not present

## 2020-05-30 DIAGNOSIS — K219 Gastro-esophageal reflux disease without esophagitis: Secondary | ICD-10-CM | POA: Diagnosis not present

## 2020-05-30 DIAGNOSIS — R1310 Dysphagia, unspecified: Secondary | ICD-10-CM | POA: Diagnosis not present

## 2020-05-30 DIAGNOSIS — J90 Pleural effusion, not elsewhere classified: Secondary | ICD-10-CM | POA: Diagnosis not present

## 2020-05-30 DIAGNOSIS — R058 Other specified cough: Secondary | ICD-10-CM | POA: Diagnosis not present

## 2020-05-31 ENCOUNTER — Ambulatory Visit (HOSPITAL_COMMUNITY)
Admission: RE | Admit: 2020-05-31 | Discharge: 2020-05-31 | Disposition: A | Discharge status: Home / Self Care | Payer: Medicare PPO | Source: Ambulatory Visit | Attending: Physician Assistant | Admitting: Physician Assistant

## 2020-05-31 ENCOUNTER — Telehealth: Payer: Self-pay | Admitting: Emergency Medicine

## 2020-05-31 ENCOUNTER — Other Ambulatory Visit: Payer: Self-pay

## 2020-05-31 DIAGNOSIS — C3490 Malignant neoplasm of unspecified part of unspecified bronchus or lung: Secondary | ICD-10-CM | POA: Insufficient documentation

## 2020-05-31 DIAGNOSIS — R590 Localized enlarged lymph nodes: Secondary | ICD-10-CM | POA: Insufficient documentation

## 2020-05-31 DIAGNOSIS — J910 Malignant pleural effusion: Secondary | ICD-10-CM | POA: Insufficient documentation

## 2020-05-31 DIAGNOSIS — C3412 Malignant neoplasm of upper lobe, left bronchus or lung: Secondary | ICD-10-CM | POA: Diagnosis not present

## 2020-05-31 LAB — GLUCOSE, CAPILLARY: Glucose-Capillary: 96 mg/dL (ref 70–99)

## 2020-05-31 MED ORDER — FLUDEOXYGLUCOSE F - 18 (FDG) INJECTION
6.1200 | Freq: Once | INTRAVENOUS | Status: AC | PRN
  Administered 2020-05-31: 6.12 via INTRAVENOUS

## 2020-05-31 NOTE — Telephone Encounter (Signed)
[facility] NSCLC - Customer service manager for the Discovery and Validation of Biomarkers for the Prediction, Diagnosis, and Management of Disease  05/31/20  Outgoing call: Called patient concerning scheduling lab draw for the [facility] 1694 study.  Left voicemail requesting a return call.  Clabe Seal Clinical Research Coordinator I  05/31/20  1:57 PM

## 2020-06-01 DIAGNOSIS — R058 Other specified cough: Secondary | ICD-10-CM | POA: Diagnosis not present

## 2020-06-01 DIAGNOSIS — C3492 Malignant neoplasm of unspecified part of left bronchus or lung: Secondary | ICD-10-CM | POA: Diagnosis not present

## 2020-06-01 DIAGNOSIS — K219 Gastro-esophageal reflux disease without esophagitis: Secondary | ICD-10-CM | POA: Diagnosis not present

## 2020-06-01 DIAGNOSIS — E876 Hypokalemia: Secondary | ICD-10-CM | POA: Diagnosis not present

## 2020-06-01 DIAGNOSIS — R2681 Unsteadiness on feet: Secondary | ICD-10-CM | POA: Diagnosis not present

## 2020-06-01 DIAGNOSIS — R1319 Other dysphagia: Secondary | ICD-10-CM | POA: Diagnosis not present

## 2020-06-01 DIAGNOSIS — M6281 Muscle weakness (generalized): Secondary | ICD-10-CM | POA: Diagnosis not present

## 2020-06-01 DIAGNOSIS — R1310 Dysphagia, unspecified: Secondary | ICD-10-CM | POA: Diagnosis not present

## 2020-06-01 DIAGNOSIS — J9610 Chronic respiratory failure, unspecified whether with hypoxia or hypercapnia: Secondary | ICD-10-CM | POA: Diagnosis not present

## 2020-06-01 DIAGNOSIS — J90 Pleural effusion, not elsewhere classified: Secondary | ICD-10-CM | POA: Diagnosis not present

## 2020-06-02 ENCOUNTER — Other Ambulatory Visit: Payer: Self-pay | Admitting: Medical Oncology

## 2020-06-02 DIAGNOSIS — R1310 Dysphagia, unspecified: Secondary | ICD-10-CM | POA: Diagnosis not present

## 2020-06-02 DIAGNOSIS — C3492 Malignant neoplasm of unspecified part of left bronchus or lung: Secondary | ICD-10-CM

## 2020-06-02 DIAGNOSIS — M6281 Muscle weakness (generalized): Secondary | ICD-10-CM | POA: Diagnosis not present

## 2020-06-02 DIAGNOSIS — J90 Pleural effusion, not elsewhere classified: Secondary | ICD-10-CM | POA: Diagnosis not present

## 2020-06-02 DIAGNOSIS — R2681 Unsteadiness on feet: Secondary | ICD-10-CM | POA: Diagnosis not present

## 2020-06-02 DIAGNOSIS — R058 Other specified cough: Secondary | ICD-10-CM | POA: Diagnosis not present

## 2020-06-02 DIAGNOSIS — R1319 Other dysphagia: Secondary | ICD-10-CM | POA: Diagnosis not present

## 2020-06-02 DIAGNOSIS — E876 Hypokalemia: Secondary | ICD-10-CM | POA: Diagnosis not present

## 2020-06-02 DIAGNOSIS — J9610 Chronic respiratory failure, unspecified whether with hypoxia or hypercapnia: Secondary | ICD-10-CM | POA: Diagnosis not present

## 2020-06-02 DIAGNOSIS — K219 Gastro-esophageal reflux disease without esophagitis: Secondary | ICD-10-CM | POA: Diagnosis not present

## 2020-06-02 NOTE — Telephone Encounter (Signed)
[facility] NSCLC - Customer service manager for the Discovery and Validation of Biomarkers for the Prediction, Diagnosis, and Management of Disease  06/02/20  Received email from patient stating she has a lab appointment on Wednesday the 26th and is okay with having research labs drawn at that time.  She also stated she does not have access to her home phone right now, and to call her cell if needed.    Called mobile phone but patient did not answer, unable to leave voicemail.  Replied to patient's email to confirm research lab draw for 06/07/20.  She was thanked for participation and advised to call or email with any questions.  Will plan to have labs drawn on Wednesday the 26th along with routine labs and give patient $50 gift card for participation at that time.   Clabe Seal Clinical Research Coordinator I  06/02/20  2:32 PM

## 2020-06-05 DIAGNOSIS — R2681 Unsteadiness on feet: Secondary | ICD-10-CM | POA: Diagnosis not present

## 2020-06-05 DIAGNOSIS — K219 Gastro-esophageal reflux disease without esophagitis: Secondary | ICD-10-CM | POA: Diagnosis not present

## 2020-06-05 DIAGNOSIS — R058 Other specified cough: Secondary | ICD-10-CM | POA: Diagnosis not present

## 2020-06-05 DIAGNOSIS — E876 Hypokalemia: Secondary | ICD-10-CM | POA: Diagnosis not present

## 2020-06-05 DIAGNOSIS — J9610 Chronic respiratory failure, unspecified whether with hypoxia or hypercapnia: Secondary | ICD-10-CM | POA: Diagnosis not present

## 2020-06-05 DIAGNOSIS — R1319 Other dysphagia: Secondary | ICD-10-CM | POA: Diagnosis not present

## 2020-06-05 DIAGNOSIS — J90 Pleural effusion, not elsewhere classified: Secondary | ICD-10-CM | POA: Diagnosis not present

## 2020-06-05 DIAGNOSIS — R1310 Dysphagia, unspecified: Secondary | ICD-10-CM | POA: Diagnosis not present

## 2020-06-05 DIAGNOSIS — M6281 Muscle weakness (generalized): Secondary | ICD-10-CM | POA: Diagnosis not present

## 2020-06-06 ENCOUNTER — Encounter: Payer: Self-pay | Admitting: *Deleted

## 2020-06-06 NOTE — Progress Notes (Signed)
I followed up on Ms. Glauser's Guardant 360 results.  I printed and updated his desk nurse and CMA to place on his desk.  She has an appt with him tomorrow.

## 2020-06-07 ENCOUNTER — Inpatient Hospital Stay: Payer: Medicare PPO | Admitting: Internal Medicine

## 2020-06-07 ENCOUNTER — Inpatient Hospital Stay: Payer: Medicare PPO

## 2020-06-07 ENCOUNTER — Encounter: Payer: Self-pay | Admitting: Emergency Medicine

## 2020-06-07 ENCOUNTER — Ambulatory Visit (HOSPITAL_COMMUNITY)
Admission: RE | Admit: 2020-06-07 | Discharge: 2020-06-07 | Disposition: A | Discharge status: Home / Self Care | Payer: Medicare PPO | Source: Ambulatory Visit | Attending: Physician Assistant | Admitting: Physician Assistant

## 2020-06-07 ENCOUNTER — Telehealth: Payer: Self-pay | Admitting: Pharmacist

## 2020-06-07 ENCOUNTER — Other Ambulatory Visit (HOSPITAL_COMMUNITY): Payer: Self-pay | Admitting: Internal Medicine

## 2020-06-07 ENCOUNTER — Other Ambulatory Visit: Payer: Self-pay

## 2020-06-07 VITALS — BP 160/84 | HR 87 | Temp 98.1°F | Resp 18 | Ht 64.0 in | Wt 130.0 lb

## 2020-06-07 DIAGNOSIS — C3490 Malignant neoplasm of unspecified part of unspecified bronchus or lung: Secondary | ICD-10-CM | POA: Diagnosis not present

## 2020-06-07 DIAGNOSIS — E785 Hyperlipidemia, unspecified: Secondary | ICD-10-CM | POA: Diagnosis not present

## 2020-06-07 DIAGNOSIS — Z5111 Encounter for antineoplastic chemotherapy: Secondary | ICD-10-CM | POA: Diagnosis not present

## 2020-06-07 DIAGNOSIS — C3412 Malignant neoplasm of upper lobe, left bronchus or lung: Secondary | ICD-10-CM

## 2020-06-07 DIAGNOSIS — C3492 Malignant neoplasm of unspecified part of left bronchus or lung: Secondary | ICD-10-CM | POA: Diagnosis not present

## 2020-06-07 DIAGNOSIS — J910 Malignant pleural effusion: Secondary | ICD-10-CM | POA: Diagnosis not present

## 2020-06-07 DIAGNOSIS — I6389 Other cerebral infarction: Secondary | ICD-10-CM | POA: Diagnosis not present

## 2020-06-07 DIAGNOSIS — C7931 Secondary malignant neoplasm of brain: Secondary | ICD-10-CM | POA: Diagnosis not present

## 2020-06-07 DIAGNOSIS — R590 Localized enlarged lymph nodes: Secondary | ICD-10-CM | POA: Diagnosis not present

## 2020-06-07 DIAGNOSIS — I493 Ventricular premature depolarization: Secondary | ICD-10-CM | POA: Diagnosis not present

## 2020-06-07 DIAGNOSIS — I10 Essential (primary) hypertension: Secondary | ICD-10-CM | POA: Diagnosis not present

## 2020-06-07 DIAGNOSIS — G9389 Other specified disorders of brain: Secondary | ICD-10-CM | POA: Diagnosis not present

## 2020-06-07 DIAGNOSIS — Z87891 Personal history of nicotine dependence: Secondary | ICD-10-CM | POA: Diagnosis not present

## 2020-06-07 DIAGNOSIS — M810 Age-related osteoporosis without current pathological fracture: Secondary | ICD-10-CM | POA: Diagnosis not present

## 2020-06-07 LAB — CBC WITH DIFFERENTIAL (CANCER CENTER ONLY)
Abs Immature Granulocytes: 0.07 10*3/uL (ref 0.00–0.07)
Basophils Absolute: 0.1 10*3/uL (ref 0.0–0.1)
Basophils Relative: 1 %
Eosinophils Absolute: 0.2 10*3/uL (ref 0.0–0.5)
Eosinophils Relative: 1 %
HCT: 44.4 % (ref 36.0–46.0)
Hemoglobin: 14.7 g/dL (ref 12.0–15.0)
Immature Granulocytes: 1 %
Lymphocytes Relative: 7 %
Lymphs Abs: 0.9 10*3/uL (ref 0.7–4.0)
MCH: 29.6 pg (ref 26.0–34.0)
MCHC: 33.1 g/dL (ref 30.0–36.0)
MCV: 89.5 fL (ref 80.0–100.0)
Monocytes Absolute: 0.9 10*3/uL (ref 0.1–1.0)
Monocytes Relative: 8 %
Neutro Abs: 10 10*3/uL — ABNORMAL HIGH (ref 1.7–7.7)
Neutrophils Relative %: 82 %
Platelet Count: 451 10*3/uL — ABNORMAL HIGH (ref 150–400)
RBC: 4.96 MIL/uL (ref 3.87–5.11)
RDW: 15.6 % — ABNORMAL HIGH (ref 11.5–15.5)
WBC Count: 12 10*3/uL — ABNORMAL HIGH (ref 4.0–10.5)
nRBC: 0 % (ref 0.0–0.2)

## 2020-06-07 LAB — CMP (CANCER CENTER ONLY)
ALT: 17 U/L (ref 0–44)
AST: 22 U/L (ref 15–41)
Albumin: 3 g/dL — ABNORMAL LOW (ref 3.5–5.0)
Alkaline Phosphatase: 77 U/L (ref 38–126)
Anion gap: 7 (ref 5–15)
BUN: 11 mg/dL (ref 8–23)
CO2: 26 mmol/L (ref 22–32)
Calcium: 10.3 mg/dL (ref 8.9–10.3)
Chloride: 105 mmol/L (ref 98–111)
Creatinine: 0.76 mg/dL (ref 0.44–1.00)
GFR, Estimated: >60 mL/min (ref >60)
Glucose, Bld: 103 mg/dL — ABNORMAL HIGH (ref 70–99)
Potassium: 4.1 mmol/L (ref 3.5–5.1)
Sodium: 138 mmol/L (ref 135–145)
Total Bilirubin: 0.5 mg/dL (ref 0.3–1.2)
Total Protein: 6.7 g/dL (ref 6.5–8.1)

## 2020-06-07 LAB — RESEARCH LABS

## 2020-06-07 MED ORDER — OSIMERTINIB MESYLATE 80 MG PO TABS: 80.0000 mg | ORAL_TABLET | Freq: Every day | ORAL | 3 refills | Status: DC

## 2020-06-07 MED ORDER — GADOBUTROL 1 MMOL/ML IV SOLN
6.0000 mL | Freq: Once | INTRAVENOUS | Status: AC | PRN
  Administered 2020-06-07: 6 mL via INTRAVENOUS

## 2020-06-07 NOTE — Research (Signed)
Shelley Flores 1694 NSCLC - Customer service manager for the Discovery and Validation of Biomarkers for the Prediction, Diagnosis, and Management of Disease  06/07/20  Research Labs:  DWANNA GOSHERT presented alone to the clinic for a lab appointment prior to her visit with Dr. Julien Nordmann.  She agreed for research labs to be drawn and denied any questions.  Research labs were drawn at 1306 by phlebotomist, Unice Bailey.  Patient was given a $50 gift card for participation in the research study.  She was thanked for her time and participation.  Clabe Seal Clinical Research Coordinator I  06/07/20 2:41 PM

## 2020-06-07 NOTE — Progress Notes (Signed)
START ON PATHWAY REGIMEN - Non-Small Cell Lung     A cycle is every 28 days:     Osimertinib   **Always confirm dose/schedule in your pharmacy ordering system**  Patient Characteristics: Stage IV Metastatic, Nonsquamous, Initial Molecular Targeted Therapy, EGFR Mutation - Common (Exon 19 Deletion or Exon 21 L858R Substitution) Therapeutic Status: Stage IV Metastatic Histology: Nonsquamous Cell ROS1 Rearrangement Status: Negative Other Mutations/Biomarkers: No Other Actionable Mutations Chemotherapy/Immunotherapy LOT: Not Appropriate Molecular Targeted Therapy: Initial Molecular Targeted Therapy KRAS G12C Mutation Status: Negative MET Exon 14 Mutation Status: Negative RET Gene Fusion Status: Negative EGFR Mutation Status: Positive - Common (Exon 19 Deletion or Exon 21 L858R Substitution) NTRK Gene Fusion Status: Negative PD-L1 Expression Status: Did Not Order Test ALK Rearrangement Status: Negative BRAF V600E Mutation Status: Negative Intent of Therapy: Non-Curative / Palliative Intent, Discussed with Patient 

## 2020-06-07 NOTE — Telephone Encounter (Addendum)
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   Counseled patient on administration, dosing, side effects, monitoring, drug-food interactions, safe handling, storage, and disposal.  Patient will take Tagrisso 80 tablets, 1 tablet by mouth once daily, without regard to food.  Tagrisso start date: 06/10/20  Adverse effects include but are not limited to: diarrhea, mouth sores, decreased appetitie, fatigue, dry skin, rash, nail changes, altered cardiac conduction, and decreased blood counts or electrolytes.  Patient will obtain anti diarrheal and alert the office of 4 or more loose stools above baseline.   Reviewed with patient importance of keeping a medication schedule and plan for any missed doses. No barriers to medication adherence identified.  Medication reconciliation performed and medication/allergy list updated.  Insurance authorization for [name] has been obtained. Test claim at the pharmacy revealed copayment $100 for 1st fill of Tagrisso. Fatima Sanger obtained for patient bringing out of pocket copay to $0. This will ship from the [facility] on 06/08/20 to deliver to patient's home on 06/09/20.  Patient informed the pharmacy will reach out 5-7 days prior to needing next fill of Tagrisso to coordinate continued medication acquisition to prevent break in therapy.  All questions answered.  Ms. Salberg voiced understanding and appreciation.   Medication education handout given to patient. Patient knows to call the office with questions or concerns. Oral Chemotherapy Clinic phone number provided to patient.   Leron Croak, PharmD, BCPS Hematology/Oncology Clinical Pharmacist [facility] [phone] 06/07/2020 4:18 PM

## 2020-06-07 NOTE — Progress Notes (Signed)
[place] Telephone:(336) [phone]   Fax:([phone]  OFFICE PROGRESS NOTE  Cari Caraway, MD [place] [address]  DIAGNOSIS: Stage IV (T2b, N2, M1c), non-small cell lung cancer, adenocarcinoma.  The patient presented with a left upper lobe mass, right supra clavicular, left hilar and subcarinal lymphadenopathy as well as diffuse left-sided pleural disease with associated malignant left pleural effusion and 2 small lymph nodes near the SMA in addition to multiple brain metastasis diagnosed in December 2021.   Molecular studies by [facility] showed positive EGFR mutation with deletion in exon 19  PRIOR THERAPY: None  CURRENT THERAPY: Tagrisso 80 mg p.o. daily.  Expected to start in the next few days.  INTERVAL HISTORY: Shelley Flores 87 y.o. female returns to the clinic today for follow-up visit.  The patient is currently a resident of a skilled nursing facility.  She continues to have the baseline shortness of breath.  She also continues to have drainage of the left pleural effusion very at the Pleurx catheter.  Every other day.  She is followed by Dr. Valeta Harms for the left pleural effusion and management of the Pleurx catheter.  The patient has no current chest pain but has mild cough with no hemoptysis.  She denied having any fever or chills.  She has no nausea, vomiting, diarrhea or constipation.  She denied having any headache or visual changes.  She had MRI of the brain performed yesterday and that showed multiple brain metastasis.  She also had molecular studies by [facility] and that showed positive EGFR mutation with deletion in exon 19.  The patient is here today for evaluation and discussion of her treatment options based on the new findings.  MEDICAL HISTORY: Past Medical History:  Diagnosis Date  . Bruit    Abdominal bruit - Abdominal aorta/renal duplex Doppler evaluation 12/07/03 -Mildly abnormal evaluation. *Celiac: At Rest,  165.2 cm/s; Inspiration 117.1 cm/s. This is consistant w/median arcuate ligament compression syndrome. *Right & Left Kidney: Essentially equal in size, symmetrical in shape w/no significant abnormalities visualized. *Right & Left Renal Arteries: No significant abnormalities.  . Edema extremities    LE edema   . H/O myocardial perfusion scan 02/20/00   To rule out ischemia - Negative adequate Bruce protocol exercise stress test with a deconditioned exercise response and normal static myocardial perfusion images with EF calculated by QGS of 77%. Represents a low risk study.  . Hyperlipidemia   . Hypertension   . Osteoarthritis   . Osteoporosis   . Pleural effusion 04/2020  . Valvular heart disease    Mild valvular heart disease by Echo in 2010, all mild and symptomatic, including concentric LVH, MR, TR, and AI with pulmonary artery pressure of 32. EF was normal.    ALLERGIES:  is allergic to irbesartan.  MEDICATIONS:  Current Outpatient Medications  Medication Sig Dispense Refill  . acetaminophen (TYLENOL) 325 MG tablet Take 2 tablets (650 mg total) by mouth every 6 (six) hours as needed for mild pain (or Fever >/= 101). 20 tablet 0  . aspirin 81 MG tablet Take 81 mg by mouth daily.    [name] atorvastatin (LIPITOR) 20 MG tablet TAKE 1 TABLET BY MOUTH EVERY DAY (Patient taking differently: Take 20 mg by mouth at bedtime.) 90 tablet 1  . furosemide (LASIX) 20 MG tablet Take 20 mg by mouth daily.    . metoprolol succinate (TOPROL-XL) 50 MG 24 hr tablet Take 50 mg by mouth  daily. Take with or immediately following a meal.    . Multiple Vitamin (MULTIVITAMIN) tablet Take 1 tablet by mouth daily.    [name] omeprazole (PRILOSEC) 20 MG capsule Take 20 mg by mouth 2 (two) times daily.    . potassium chloride SA (KLOR-CON) 20 MEQ tablet Take 20 mEq by mouth every morning.    . zinc oxide 20 % ointment Apply 1 application topically as needed for irritation. apply to buttocks/peri, topical, As Needed, To buttocks  after every incontinent episode and as needed for redness. May keep at bedside.     No current facility-administered medications for this visit.    SURGICAL HISTORY:  Past Surgical History:  Procedure Laterality Date  . Abdominal aorta/Renal duplex Doppler Evaluation  12/07/03   For abdominal bruit. Mildly abnormal evaluation. (See bruit in medical history)  . BREAST EXCISIONAL BIOPSY Left 1966  . BREAST EXCISIONAL BIOPSY Left 1999  . CATARACT EXTRACTION  2003  . COLONOSCOPY  10/22/2004  . DEXA Bone Scan  06/23/2013  . IR PERC PLEURAL DRAIN W/INDWELL CATH W/IMG GUIDE  05/10/2020  . IR THORACENTESIS ASP PLEURAL SPACE W/IMG GUIDE  05/04/2020    REVIEW OF SYSTEMS:  Constitutional: positive for fatigue Eyes: negative Ears, nose, mouth, throat, and face: negative Respiratory: positive for cough and dyspnea on exertion Cardiovascular: negative Gastrointestinal: negative Genitourinary:negative Integument/breast: negative Hematologic/lymphatic: negative Musculoskeletal:negative Neurological: positive for weakness Behavioral/Psych: negative Endocrine: negative Allergic/Immunologic: negative   PHYSICAL EXAMINATION: General appearance: alert, cooperative, fatigued and no distress Head: Normocephalic, without obvious abnormality, atraumatic Neck: no adenopathy, no JVD, supple, symmetrical, trachea midline and thyroid not enlarged, symmetric, no tenderness/mass/nodules Lymph nodes: Cervical, supraclavicular, and axillary nodes normal. Resp: diminished breath sounds LLL and dullness to percussion LLL Back: symmetric, no curvature. ROM normal. No CVA tenderness. Cardio: regular rate and rhythm, S1, S2 normal, no murmur, click, rub or gallop GI: soft, non-tender; bowel sounds normal; no masses,  no organomegaly Extremities: edema 1+ edema bilaterally Neurologic: Alert and oriented X 3, normal strength and tone. Normal symmetric reflexes. Normal coordination and gait  ECOG PERFORMANCE  STATUS: 1 - Symptomatic but completely ambulatory  Blood pressure (!) 160/84, pulse 87, temperature 98.1 F (36.7 C), temperature source Tympanic, resp. rate 18, height 5' 4" (1.626 m), weight 130 lb (59 kg), SpO2 97 %.  LABORATORY DATA: Lab Results  Component Value Date   WBC 12.0 (H) 06/07/2020   HGB 14.7 06/07/2020   HCT 44.4 06/07/2020   MCV 89.5 06/07/2020   PLT 451 (H) 06/07/2020      Chemistry      Component Value Date/Time   NA 138 06/07/2020 1306   NA 138 04/14/2020 0829   K 4.1 06/07/2020 1306   CL 105 06/07/2020 1306   CO2 26 06/07/2020 1306   BUN 11 06/07/2020 1306   BUN 12 04/14/2020 0829   CREATININE 0.76 06/07/2020 1306      Component Value Date/Time   CALCIUM 10.3 06/07/2020 1306   ALKPHOS 77 06/07/2020 1306   AST 22 06/07/2020 1306   ALT 17 06/07/2020 1306   BILITOT 0.5 06/07/2020 1306       RADIOGRAPHIC STUDIES: MR Brain W Wo Contrast  Result Date: 06/07/2020 CLINICAL DATA:  Non-small cell lung cancer, staging. EXAM: MRI HEAD WITHOUT AND WITH CONTRAST TECHNIQUE: Multiplanar, multiecho pulse sequences of the brain and surrounding structures were obtained without and with intravenous contrast. CONTRAST:  16m GADAVIST GADOBUTROL 1 MMOL/ML IV SOLN COMPARISON:  None. FINDINGS: Brain: No acute infarction, hemorrhage,  hydrocephalus or extra-axial collection. Multiple enhancing lesions consistent with metastatic disease, labeled on series 16, as follows: *Right cerebellar hemisphere, 2 mm, image 40. *Right posterior temporal operculum, 10 mm, image 88. *Left parietal lobe, 5 mm, image 102. *Left parietal lobe, 5 mm, image 111. A 1.5 x 1.3 cm dural-based extra-axial enhancing lesion in the right frontal parietal convexity. No mass effect on the adjacent brain parenchyma. Remote lacunar infarcts in the bilateral cerebellar hemispheres and right caudate head. Scattered foci of T2 hyperintensity are seen within the white matter of the cerebral hemispheres, nonspecific,  most likely related to chronic microangiopathic changes. Moderate parenchymal volume loss. Vascular: Normal flow voids. Skull and upper cervical spine: Normal marrow signal. Sinuses/Orbits: Bilateral lens surgery. Paranasal sinuses are essentially clear. IMPRESSION: 1. Multiple enhancing lesions consistent with metastatic disease. 2. A 1.5 x 1.3 cm dural-based extra-axial enhancing lesion in the right frontal parietal convexity, likely a meningioma. However, given presence of metastatic disease to the brain, the possibility of secondary lesion cannot be excluded. 3. Moderate parenchymal volume loss and chronic microangiopathic changes. Remote lacunar infarcts in the bilateral cerebellar hemispheres and right caudate head. Electronically Signed   By: Pedro Earls M.D.   On: 06/07/2020 13:17   NM PET Image Initial (PI) Skull Base To Thigh  Result Date: 05/31/2020 CLINICAL DATA:  Initial treatment strategy for adenocarcinoma left upper lobe with malignant pleural effusion. EXAM: NUCLEAR MEDICINE PET SKULL BASE TO THIGH TECHNIQUE: 6.2 mCi F-18 FDG was injected intravenously. Full-ring PET imaging was performed from the skull base to thigh after the radiotracer. CT data was obtained and used for attenuation correction and anatomic localization. Fasting blood glucose: 96 mg/dl COMPARISON:  Chest CT 05/02/2020 FINDINGS: Mediastinal blood pool activity: SUV max 2.53 Liver activity: SUV max NA NECK: No hypermetabolic lymph nodes in the neck. Incidental CT findings: none CHEST: The medial left upper lobe lung mass is hypermetabolic with SUV max of 4.80. 9 mm left hilar node has an SUV max of 5.69. 13 mm subcarinal lymph node has an SUV max of 1.65 Hypermetabolic supraclavicular nodes on the right side. The largest node measures 7.5 mm and has an SUV max of 4.64. Fairly extensive pleural disease with SUV max ranging from 3.0-4.0. No hypermetabolic metastatic pulmonary nodules are identified. Incidental CT  findings: Left-sided PleurX drainage catheter in place. Persistent small right pleural effusion. ABDOMEN/PELVIS: 2 small adjacent celiac axis lymph nodes are hypermetabolic with SUV max of 5.37. No other abdominal/pelvic metastatic disease is identified. No hepatic or adrenal gland lesions. Incidental CT findings: Stable large bilateral renal cysts. SKELETON: No focal hypermetabolic activity to suggest skeletal metastasis. Incidental CT findings: none IMPRESSION: 1. 4.5 cm medial left upper lobe mass is hypermetabolic and consistent with primary lung neoplasm. Associated hypermetabolic right supraclavicular, left hilar and subcarinal adenopathy. 2. Diffuse left-sided hypermetabolic pleural disease and associated malignant left pleural effusion. 3. Two small lymph nodes near the SMA are hypermetabolic and consistent with metastatic adenopathy. No other abdominal/pelvic metastatic disease is identified. 4. No findings for osseous metastatic disease. Electronically Signed   By: Marijo Sanes M.D.   On: 05/31/2020 10:05   DG CHEST PORT 1 VIEW  Result Date: 05/11/2020 CLINICAL DATA:  Shortness of breath with cough EXAM: PORTABLE CHEST 1 VIEW COMPARISON:  May 10, 2020 FINDINGS: Left drainage catheter present medially with interval drainage of most of the pleural effusion compared to 1 day prior. Small amount of residual effusion remains with atelectatic change in the left mid  lower lung regions. Apparent consolidation abutting the left hilum is present. On the right, there is a small pleural effusion with slight right base atelectasis. Heart is upper normal in size with pulmonary vascularity normal. No adenopathy no bone lesions. IMPRESSION: Most of pleural fluid drain on the left following drainage catheter placement. No pneumothorax. Consolidation overlying the left hilum/perihilar region again noted. Stable cardiac silhouette. Small right pleural effusion with mild right base atelectasis. Right lung otherwise  clear. Electronically Signed   By: Lowella Grip III M.D.   On: 05/11/2020 08:04   DG CHEST PORT 1 VIEW  Result Date: 05/10/2020 CLINICAL DATA:  Short of breath EXAM: PORTABLE CHEST 1 VIEW COMPARISON:  05/09/2020 FINDINGS: Extensive consolidation left lower lobe with mild progression, with associated pleural effusion. No pneumothorax Mild right lower lobe airspace disease and small right effusion unchanged. Negative for heart failure or edema. IMPRESSION: Extensive consolidation left lower lobe with mild progression. Left pleural effusion not well visualized due to consolidation. Mild right lower lobe airspace disease unchanged. Electronically Signed   By: Franchot Gallo M.D.   On: 05/10/2020 08:07   DG CHEST PORT 1 VIEW  Result Date: 05/09/2020 CLINICAL DATA:  Shortness of breath. EXAM: PORTABLE CHEST 1 VIEW COMPARISON:  Chest x-ray 05/06/2020 and CT chest 05/05/2020. FINDINGS: Stable left hilar mass with progressive surrounding airspace consolidation and near complete left lower lobe atelectasis. Suspect enlarging left pleural effusion. Stable small right pleural effusion. The right lung remains relatively clear. IMPRESSION: Stable left hilar mass with progressive surrounding airspace consolidation and near complete left lower lobe/lingular atelectasis. Suspect enlarging left pleural effusion. Electronically Signed   By: Marijo Sanes M.D.   On: 05/09/2020 09:25   IR PERC PLEURAL DRAIN W/INDWELL CATH W/IMG GUIDE  Result Date: 05/10/2020 CLINICAL DATA:  Recurrent malignant left pleural effusion and need for tunneled pleural drainage catheter prior to discharge from the hospital. EXAM: INSERTION OF TUNNELED PLEURAL DRAINAGE CATHETER ANESTHESIA/SEDATION: 1.0 mg IV Versed; 50 mcg IV Fentanyl. Total Moderate Sedation Time: 27 minutes. The patient's level of consciousness and physiologic status were continuously monitored during the procedure by Radiology nursing. MEDICATIONS: 2 g IV Ancef. Antibiotic  was administered in an appropriate time interval for the procedure. FLUOROSCOPY TIME:  6 seconds.  1.0 mGy. PROCEDURE: The procedure, risks, benefits, and alternatives were explained to the patient. Questions regarding the procedure were encouraged and answered. The patient understands and consents to the procedure. A time-out was performed prior to initiating the procedure. The left chest wall was prepped with chlorhexidine in a sterile fashion, and a sterile drape was applied covering the operative field. A sterile gown and sterile gloves were used for the procedure. Local anesthesia was provided with 1% Lidocaine. Ultrasound image documentation was performed. Fluoroscopy during the procedure and fluoroscopic spot radiograph confirms appropriate catheter position. After creating a small skin incision, a 19 gauge needle was advanced into the pleural cavity under ultrasound guidance. A guide wire was then advanced under fluoroscopy into the pleural space. Pleural access was dilated serially and a 16-French peel-away sheath placed. A 15.5 French tunneled PleurX catheter was placed. This was tunneled from an incision 5 cm below the pleural access to the access site. The catheter was advanced through the peel-away sheath. The sheath was then removed. Final catheter positioning was confirmed with a fluoroscopic spot image. The access incision was closed with subcutaneous and subcuticular 4-0 Vicryl. Dermabond was applied to the incision. A Prolene retention suture was applied at the catheter exit  site. Thoracentesis was then performed via a vacuum bottle. COMPLICATIONS: None. FINDINGS: The catheter was placed via the left lower lateral chest wall. Approximately 1.1 liters of pleural fluid was able to be removed after catheter placement. IMPRESSION: Placement of permanent, tunneled left pleural drainage catheter via lateral approach. 1.1 liters of pleural fluid was removed today after catheter placement. Electronically  Signed   By: Aletta Edouard M.D.   On: 05/10/2020 14:15    ASSESSMENT AND PLAN: This is a very pleasant 87 years old white female recently diagnosed with a stage IV (T3, N3, M1 C) non-small cell lung cancer, adenocarcinoma with positive EGFR mutation with deletion in exon 19 diagnosed in December 2021 and presented with left lower lobe lung mass in addition to right supraclavicular, left hilar and subcarinal lymphadenopathy as well as metastatic lymphadenopathy in the abdomen and left pleural-based metastasis as well as malignant left pleural effusion and multiple brain metastasis. The molecular study showed positive EGFR mutation with deletion in exon 19. I had a lengthy discussion with the patient today about her current disease stage, prognosis and treatment options. I explained to the patient that with the detection of EGFR exon 19 deletion the patient would be a good candidate for treatment with targeted therapy with oral Tagrisso 80 mg p.o. daily.  She was also given the option of palliative care.  She is interested and the treatment with the targeted therapy I discussed with the patient the adverse effect of this treatment including but not limited to skin rash, diarrhea, interstitial lung disease, liver or renal dysfunction. She will see the pharmacist for oral oncolytic today for further evaluation and education as well as to help the patient with the approval of her medication. For the brain metastasis I will monitor this subcentimeter lesions on the treatment with Tagrisso which across the blood-brain barrier and has been very effective in the management of brain metastasis and the patient is currently asymptomatic. The patient will come back for follow-up visit in 3 weeks for evaluation and repeat blood work. She was advised to call immediately if she has any other concerning symptoms in the interval.  The patient voices understanding of current disease status and treatment options and is  in agreement with the current care plan.  All questions were answered. The patient knows to call the clinic with any problems, questions or concerns. We can certainly see the patient much sooner if necessary. The total time spent in the appointment was 40 minutes.  Disclaimer: This note was dictated with voice recognition software. Similar sounding words can inadvertently be transcribed and may not be corrected upon review.

## 2020-06-07 NOTE — Telephone Encounter (Signed)
Oral Oncology Pharmacist Encounter  Received new prescription for Tagrisso (osimertinib) for the treatment of stage IV metastatic NSCLC, EGFR mutation-positive (exon 19 deletion) planned duration until disease progression or unacceptable drug toxicity.  Prescription dose and frequency assessed for appropriateness. Appropriate for therapy initiation.   CBC w/ Diff and CMP from 06/07/20 assessed, labs OK for treatment initiation. Last EKG obtained 05/02/20 - QTc at that time was 489 ms (noted that a previous EKG from 03/24/20 had QTc of 445 ms)  Current medication list in Epic reviewed, no relevant/significant DDIs with Tagrisso identified.  Evaluated chart and no patient barriers to medication adherence noted.   Prescription has been e-scribed to the [facility] for benefits analysis and approval.  Oral Oncology Clinic will continue to follow for insurance authorization, copayment issues, initial counseling and start date.  Leron Croak, PharmD, BCPS Hematology/Oncology Clinical Pharmacist [facility] [phone] 06/07/2020 4:16 PM

## 2020-06-08 ENCOUNTER — Telehealth: Payer: Self-pay | Admitting: Pharmacist

## 2020-06-08 ENCOUNTER — Non-Acute Institutional Stay (SKILLED_NURSING_FACILITY): Payer: Medicare PPO | Admitting: Internal Medicine

## 2020-06-08 DIAGNOSIS — M6281 Muscle weakness (generalized): Secondary | ICD-10-CM | POA: Diagnosis not present

## 2020-06-08 DIAGNOSIS — J90 Pleural effusion, not elsewhere classified: Secondary | ICD-10-CM

## 2020-06-08 DIAGNOSIS — I10 Essential (primary) hypertension: Secondary | ICD-10-CM | POA: Diagnosis not present

## 2020-06-08 DIAGNOSIS — R2681 Unsteadiness on feet: Secondary | ICD-10-CM | POA: Diagnosis not present

## 2020-06-08 DIAGNOSIS — R6881 Early satiety: Secondary | ICD-10-CM

## 2020-06-08 DIAGNOSIS — K219 Gastro-esophageal reflux disease without esophagitis: Secondary | ICD-10-CM | POA: Diagnosis not present

## 2020-06-08 DIAGNOSIS — R058 Other specified cough: Secondary | ICD-10-CM | POA: Diagnosis not present

## 2020-06-08 DIAGNOSIS — E876 Hypokalemia: Secondary | ICD-10-CM | POA: Diagnosis not present

## 2020-06-08 DIAGNOSIS — C3492 Malignant neoplasm of unspecified part of left bronchus or lung: Secondary | ICD-10-CM

## 2020-06-08 DIAGNOSIS — R609 Edema, unspecified: Secondary | ICD-10-CM

## 2020-06-08 DIAGNOSIS — J9610 Chronic respiratory failure, unspecified whether with hypoxia or hypercapnia: Secondary | ICD-10-CM | POA: Diagnosis not present

## 2020-06-08 DIAGNOSIS — R1319 Other dysphagia: Secondary | ICD-10-CM | POA: Diagnosis not present

## 2020-06-08 DIAGNOSIS — R1310 Dysphagia, unspecified: Secondary | ICD-10-CM | POA: Diagnosis not present

## 2020-06-08 MED FILL — TAGRISSO 80 MG TABLET: 80 | 30 days supply | Qty: 30 | Fill #0

## 2020-06-08 NOTE — Telephone Encounter (Signed)
Oral Oncology Pharmacist Encounter   Received notification from [facility] that prior authorization for Tagrisso is required.  PA submitted on CoverMyMeds Key [id number] Status is pending   Oral Oncology Clinic will continue to follow.   Leron Croak, PharmD, BCPS Hematology/Oncology Clinical Pharmacist [facility] [phone] 06/08/2020 8:37 AM

## 2020-06-08 NOTE — Telephone Encounter (Signed)
Oral Oncology Pharmacist Encounter   Prior Authorization for Tagrisso (osimertinib) has been approved.     PA# [id number] Effective dates: 06/08/2020 through 12/05/2020  Patient's copay is: $100   Leron Croak, PharmD, BCPS Hematology/Oncology Clinical Pharmacist [facility] [phone] 06/08/2020 8:38 AM

## 2020-06-08 NOTE — Telephone Encounter (Signed)
Oral Chemotherapy Pharmacist Encounter  Successfully enrolled patient for copayment assistance funds from [employer] from the [employer].  Award amount: $4000 Effective dates: 06/08/2020 - 06/08/2021 ID: [id number] BIN: [id number] Group: [place] PCN: [id number]  Billing information will be shared with [facility]. I will place a copy of the award letter to be scanned into patient's chart.  Leron Croak, PharmD, BCPS Hematology/Oncology Clinical Pharmacist [facility] [phone] 06/08/2020 9:17 AM

## 2020-06-09 DIAGNOSIS — M6281 Muscle weakness (generalized): Secondary | ICD-10-CM | POA: Diagnosis not present

## 2020-06-09 DIAGNOSIS — K219 Gastro-esophageal reflux disease without esophagitis: Secondary | ICD-10-CM | POA: Diagnosis not present

## 2020-06-09 DIAGNOSIS — R1310 Dysphagia, unspecified: Secondary | ICD-10-CM | POA: Diagnosis not present

## 2020-06-09 DIAGNOSIS — J9610 Chronic respiratory failure, unspecified whether with hypoxia or hypercapnia: Secondary | ICD-10-CM | POA: Diagnosis not present

## 2020-06-09 DIAGNOSIS — E876 Hypokalemia: Secondary | ICD-10-CM | POA: Diagnosis not present

## 2020-06-09 DIAGNOSIS — R058 Other specified cough: Secondary | ICD-10-CM | POA: Diagnosis not present

## 2020-06-09 DIAGNOSIS — R1319 Other dysphagia: Secondary | ICD-10-CM | POA: Diagnosis not present

## 2020-06-09 DIAGNOSIS — J90 Pleural effusion, not elsewhere classified: Secondary | ICD-10-CM | POA: Diagnosis not present

## 2020-06-09 DIAGNOSIS — R2681 Unsteadiness on feet: Secondary | ICD-10-CM | POA: Diagnosis not present

## 2020-06-09 NOTE — Progress Notes (Signed)
Location: [facility] features editor of Service:  SNF (31)  Provider:   Code Status:  Goals of Care:  Advanced Directives 05/17/2020  Does Patient Have a Medical Advance Directive? Yes  Type of Advance Directive [place]  Does patient want to make changes to medical advance directive? No - Patient declined  Copy of [place] in Chart? Yes - validated most recent copy scanned in chart (See row information)     Chief Complaint  Patient presents with  . Acute Visit    HPI: Patient is a 87 y.o. female seen today for an acute visit for Follow up  Patient has a history of hypertension hyperlipidemia, lower extremity edema Patient was admitted from 12/20-12/31 for Left Pleural effusion with Hypoxia Followed by Diagnosis of Lung cancer  She is now in SNF for therapy and Help with her Pleurix Cathter Recent PET scan and MRI shows Metastatic Disease Oncology has started her on Tagrisso She is asymptomatic right now No SOB Does have cough but much better Continues to have swelling in her legs and says Lasix is not helping Walking with her walker Needing mild Assist Appetite is Poor sue to early satiety   Past Medical History:  Diagnosis Date  . Bruit    Abdominal bruit - Abdominal aorta/renal duplex Doppler evaluation 12/07/03 -Mildly abnormal evaluation. 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 This is consistant w/median arcuate ligament compression syndrome. *Right & Left Kidney: Essentially equal in size, symmetrical in shape w/no significant abnormalities visualized. *Right & Left Renal Arteries: No significant abnormalities.  . Edema extremities    LE edema   . H/O myocardial perfusion scan 02/20/00   To rule out ischemia - Negative adequate Bruce protocol exercise stress test with a deconditioned exercise response and normal static myocardial perfusion images with EF calculated by QGS of 77%. Represents a low risk study.  .  Hyperlipidemia   . Hypertension   . Osteoarthritis   . Osteoporosis   . Pleural effusion 04/2020  . Valvular heart disease    Mild valvular heart disease by Echo in 2010, all mild and symptomatic, including concentric LVH, MR, TR, and AI with pulmonary artery pressure of 32. EF was normal.    Past Surgical History:  Procedure Laterality Date  . Abdominal aorta/Renal duplex Doppler Evaluation  12/07/03   For abdominal bruit. Mildly abnormal evaluation. (See bruit in medical history)  . BREAST EXCISIONAL BIOPSY Left 1966  . BREAST EXCISIONAL BIOPSY Left 1999  . CATARACT EXTRACTION  2003  . COLONOSCOPY  10/22/2004  . DEXA Bone Scan  06/23/2013  . IR PERC PLEURAL DRAIN W/INDWELL CATH W/IMG GUIDE  05/10/2020  . IR THORACENTESIS ASP PLEURAL SPACE W/IMG GUIDE  05/04/2020    Allergies  Allergen Reactions  . Irbesartan Nausea Only    Outpatient Encounter Medications as of 06/08/2020  Medication Sig  . acetaminophen (TYLENOL) 325 MG tablet Take 2 tablets (650 mg total) by mouth every 6 (six) hours as needed for mild pain (or Fever >/= 101).  [name] aspirin 81 MG tablet Take 81 mg by mouth daily.  [name] atorvastatin (LIPITOR) 20 MG tablet TAKE 1 TABLET BY MOUTH EVERY DAY (Patient taking differently: Take 20 mg by mouth at bedtime.)  . furosemide (LASIX) 20 MG tablet Take 20 mg by mouth daily.  . metoprolol succinate (TOPROL-XL) 50 MG 24 hr tablet Take 50 mg by mouth daily. Take with or immediately following a meal.  .  Multiple Vitamin (MULTIVITAMIN) tablet Take 1 tablet by mouth daily.  [name] osimertinib mesylate (TAGRISSO) 80 MG tablet Take 1 tablet (80 mg total) by mouth daily. Days 1-28.  [name] potassium chloride SA (KLOR-CON) 20 MEQ tablet Take 20 mEq by mouth every morning.  . zinc oxide 20 % ointment Apply 1 application topically as needed for irritation. apply to buttocks/peri, topical, As Needed, To buttocks after every incontinent episode and as needed for redness. May keep at bedside.  .  [DISCONTINUED] omeprazole (PRILOSEC) 20 MG capsule Take 20 mg by mouth 2 (two) times daily.   No facility-administered encounter medications on file as of 06/08/2020.    Review of Systems:  Review of Systems  Constitutional: Positive for activity change and appetite change.  HENT: Negative.   Respiratory: Positive for cough.   Cardiovascular: Positive for leg swelling.  Gastrointestinal: Positive for abdominal distention.  Genitourinary: Negative.   Musculoskeletal: Negative.   Skin: Negative.   Neurological: Positive for weakness.  Psychiatric/Behavioral: Negative.     Health Maintenance  Topic Date Due  . COVID-19 Vaccine (4 - Booster) 09/24/2020  . TETANUS/TDAP  10/17/2029  . INFLUENZA VACCINE  Completed  . DEXA SCAN  Completed  . PNA vac Low Risk Adult  Completed    Physical Exam: Vitals:   06/10/20 0925  BP: (!) 151/89  Pulse: 79  Resp: 20  Temp: 97.7 F (36.5 C)   There is no height or weight on file to calculate BMI. Physical Exam Vitals reviewed.  Constitutional:      Appearance: Normal appearance.  HENT:     Head: Normocephalic.     Nose: Nose normal.     Mouth/Throat:     Mouth: Mucous membranes are moist.     Pharynx: Oropharynx is clear.  Eyes:     Pupils: Pupils are equal, round, and reactive to light.  Cardiovascular:     Rate and Rhythm: Normal rate and regular rhythm.     Pulses: Normal pulses.  Pulmonary:     Effort: Pulmonary effort is normal.     Comments: Rales in Left lower base Abdominal:     General: Abdomen is flat. Bowel sounds are normal.     Palpations: Abdomen is soft.  Musculoskeletal:        General: Swelling present.     Cervical back: Neck supple.  Skin:    General: Skin is warm.  Neurological:     General: No focal deficit present.     Mental Status: She is alert.  Psychiatric:        Mood and Affect: Mood normal.        Thought Content: Thought content normal.     Labs reviewed: Basic Metabolic Panel: Recent  Labs    05/09/20 0319 05/10/20 0322 05/11/20 0246 05/17/20 1300 06/07/20 1306  NA 136 137 135 136 138  K 4.3 4.2 3.9 3.4* 4.1  CL 106 105 104 99 105  CO2 26 23 24 26 26   GLUCOSE 108* 112* 115* 114* 103*  BUN 8 11 12 16 11   CREATININE 0.79 0.77 0.81 0.90 0.76  CALCIUM 9.6 9.8 9.6 10.3 10.3  MG 2.1 2.1 2.1  --   --   PHOS 3.1 3.0 2.7  --   --    Liver Function Tests: Recent Labs    05/11/20 0246 05/17/20 1300 06/07/20 1306  AST 19 27 22   ALT 17 21 17   ALKPHOS 48 76 77  BILITOT 0.8 0.7 0.5  PROT 5.2*  6.7 6.7  ALBUMIN 2.4* 3.2* 3.0*   No results for input(s): LIPASE, AMYLASE in the last 8760 hours. No results for input(s): AMMONIA in the last 8760 hours. CBC: Recent Labs    05/11/20 0246 05/17/20 1300 06/07/20 1306  WBC 11.1* 13.2* 12.0*  NEUTROABS 8.1* 10.9* 10.0*  HGB 14.6 15.2* 14.7  HCT 42.3 44.9 44.4  MCV 87.9 88.4 89.5  PLT 237 370 451*   Lipid Panel: Recent Labs    04/14/20 0829  CHOL 139  HDL 53  LDLCALC 68  TRIG 97  CHOLHDL 2.6   No results found for: HGBA1C  Procedures since last visit: MR Brain W Wo Contrast  Result Date: 06/07/2020 CLINICAL DATA:  Non-small cell lung cancer, staging. EXAM: MRI HEAD WITHOUT AND WITH CONTRAST TECHNIQUE: Multiplanar, multiecho pulse sequences of the brain and surrounding structures were obtained without and with intravenous contrast. CONTRAST:  52mL GADAVIST GADOBUTROL 1 MMOL/ML IV SOLN COMPARISON:  None. FINDINGS: Brain: No acute infarction, hemorrhage, hydrocephalus or extra-axial collection. Multiple enhancing lesions consistent with metastatic disease, labeled on series 16, as follows: *Right cerebellar hemisphere, 2 mm, image 40. *Right posterior temporal operculum, 10 mm, image 88. *Left parietal lobe, 5 mm, image 102. *Left parietal lobe, 5 mm, image 111. A 1.5 x 1.3 cm dural-based extra-axial enhancing lesion in the right frontal parietal convexity. No mass effect on the adjacent brain parenchyma. Remote  lacunar infarcts in the bilateral cerebellar hemispheres and right caudate head. Scattered foci of T2 hyperintensity are seen within the white matter of the cerebral hemispheres, nonspecific, most likely related to chronic microangiopathic changes. Moderate parenchymal volume loss. Vascular: Normal flow voids. Skull and upper cervical spine: Normal marrow signal. Sinuses/Orbits: Bilateral lens surgery. Paranasal sinuses are essentially clear. IMPRESSION: 1. Multiple enhancing lesions consistent with metastatic disease. 2. A 1.5 x 1.3 cm dural-based extra-axial enhancing lesion in the right frontal parietal convexity, likely a meningioma. However, given presence of metastatic disease to the brain, the possibility of secondary lesion cannot be excluded. 3. Moderate parenchymal volume loss and chronic microangiopathic changes. Remote lacunar infarcts in the bilateral cerebellar hemispheres and right caudate head. Electronically Signed   By: Pedro Earls M.D.   On: 06/07/2020 13:17   NM PET Image Initial (PI) Skull Base To Thigh  Result Date: 05/31/2020 CLINICAL DATA:  Initial treatment strategy for adenocarcinoma left upper lobe with malignant pleural effusion. EXAM: NUCLEAR MEDICINE PET SKULL BASE TO THIGH TECHNIQUE: 6.2 mCi F-18 FDG was injected intravenously. Full-ring PET imaging was performed from the skull base to thigh after the radiotracer. CT data was obtained and used for attenuation correction and anatomic localization. Fasting blood glucose: 96 mg/dl COMPARISON:  Chest CT 05/02/2020 FINDINGS: Mediastinal blood pool activity: SUV max 2.53 Liver activity: SUV max NA NECK: No hypermetabolic lymph nodes in the neck. Incidental CT findings: none CHEST: The medial left upper lobe lung mass is hypermetabolic with SUV max of 1.44. 9 mm left hilar node has an SUV max of 5.69. 13 mm subcarinal lymph node has an SUV max of 3.15 Hypermetabolic supraclavicular nodes on the right side. The largest  node measures 7.5 mm and has an SUV max of 4.64. Fairly extensive pleural disease with SUV max ranging from 3.0-4.0. No hypermetabolic metastatic pulmonary nodules are identified. Incidental CT findings: Left-sided PleurX drainage catheter in place. Persistent small right pleural effusion. ABDOMEN/PELVIS: 2 small adjacent celiac axis lymph nodes are hypermetabolic with SUV max of 4.00. No other abdominal/pelvic metastatic disease is identified.  No hepatic or adrenal gland lesions. Incidental CT findings: Stable large bilateral renal cysts. SKELETON: No focal hypermetabolic activity to suggest skeletal metastasis. Incidental CT findings: none IMPRESSION: 1. 4.5 cm medial left upper lobe mass is hypermetabolic and consistent with primary lung neoplasm. Associated hypermetabolic right supraclavicular, left hilar and subcarinal adenopathy. 2. Diffuse left-sided hypermetabolic pleural disease and associated malignant left pleural effusion. 3. Two small lymph nodes near the SMA are hypermetabolic and consistent with metastatic adenopathy. No other abdominal/pelvic metastatic disease is identified. 4. No findings for osseous metastatic disease. Electronically Signed   By: Marijo Sanes M.D.   On: 05/31/2020 10:05    Assessment/Plan 1. Edema, unspecified type On Lasix QD Not helping her edema She does not want me to change the dose today She will discuss with Cardiology   2. Primary malignant neoplasm of left lung metastatic to other site [facility]) On Tagrisso per Oncology  3. Gastroesophageal reflux disease, unspecified whether esophagitis present Prilosec Discontinued at her request  4. Pleural effusion Not draining that much anymore   5. Essential hypertension Continue On Metorpolol  6. Early satiety Continues to be the issues Dietory working with her     Labs/tests ordered:  * No order type specified * Next appt:  Visit date not found

## 2020-06-10 ENCOUNTER — Encounter: Payer: Self-pay | Admitting: Internal Medicine

## 2020-06-12 ENCOUNTER — Telehealth: Payer: Self-pay | Admitting: Internal Medicine

## 2020-06-12 DIAGNOSIS — R058 Other specified cough: Secondary | ICD-10-CM | POA: Diagnosis not present

## 2020-06-12 DIAGNOSIS — R2681 Unsteadiness on feet: Secondary | ICD-10-CM | POA: Diagnosis not present

## 2020-06-12 DIAGNOSIS — E876 Hypokalemia: Secondary | ICD-10-CM | POA: Diagnosis not present

## 2020-06-12 DIAGNOSIS — J9610 Chronic respiratory failure, unspecified whether with hypoxia or hypercapnia: Secondary | ICD-10-CM | POA: Diagnosis not present

## 2020-06-12 DIAGNOSIS — K219 Gastro-esophageal reflux disease without esophagitis: Secondary | ICD-10-CM | POA: Diagnosis not present

## 2020-06-12 DIAGNOSIS — J90 Pleural effusion, not elsewhere classified: Secondary | ICD-10-CM | POA: Diagnosis not present

## 2020-06-12 DIAGNOSIS — R1310 Dysphagia, unspecified: Secondary | ICD-10-CM | POA: Diagnosis not present

## 2020-06-12 DIAGNOSIS — M6281 Muscle weakness (generalized): Secondary | ICD-10-CM | POA: Diagnosis not present

## 2020-06-12 DIAGNOSIS — R1319 Other dysphagia: Secondary | ICD-10-CM | POA: Diagnosis not present

## 2020-06-12 NOTE — Telephone Encounter (Signed)
Scheduled per 1/27 los. Called pt and left a msg

## 2020-06-13 ENCOUNTER — Encounter: Payer: Self-pay | Admitting: Internal Medicine

## 2020-06-13 LAB — GUARDANT 360

## 2020-06-15 ENCOUNTER — Encounter: Payer: Self-pay | Admitting: Internal Medicine

## 2020-06-15 ENCOUNTER — Other Ambulatory Visit: Payer: Self-pay

## 2020-06-15 ENCOUNTER — Ambulatory Visit: Payer: Medicare PPO | Admitting: Internal Medicine

## 2020-06-15 VITALS — BP 167/77 | HR 90 | Ht 63.5 in | Wt 128.0 lb

## 2020-06-15 DIAGNOSIS — R600 Localized edema: Secondary | ICD-10-CM

## 2020-06-15 DIAGNOSIS — C3490 Malignant neoplasm of unspecified part of unspecified bronchus or lung: Secondary | ICD-10-CM | POA: Diagnosis not present

## 2020-06-15 DIAGNOSIS — E785 Hyperlipidemia, unspecified: Secondary | ICD-10-CM | POA: Diagnosis not present

## 2020-06-15 DIAGNOSIS — I10 Essential (primary) hypertension: Secondary | ICD-10-CM

## 2020-06-15 MED ORDER — FUROSEMIDE 20 MG PO TABS: 20.0000 mg | ORAL_TABLET | ORAL | 6 refills | Status: DC | PRN

## 2020-06-15 MED ORDER — SPIRONOLACTONE 25 MG PO TABS: 25.0000 mg | ORAL_TABLET | Freq: Every day | ORAL | 3 refills | Status: DC

## 2020-06-15 MED ORDER — AMLODIPINE BESYLATE 5 MG PO TABS: 5.0000 mg | ORAL_TABLET | Freq: Every day | ORAL | 3 refills | Status: DC

## 2020-06-15 NOTE — Progress Notes (Signed)
OFFICE NOTE  Chief Complaint:  Follow-up hospitalization  Primary Care Physician: Cari Caraway, MD  HPI:  SALIMA RUMER  Is a 87 year old female who comes in for followup. She has hypertension and hyperlipidemia and had had an echocardiogram in 2010 that showed mild concentric LVH, mild AI, mild MR/TR with pulmonary artery pressure of 32. Her EF was normal. She has no specific cardiac complaints. Blood pressure and cholesterol have been well-controlled. She is due for recheck of her lipid profile. Unfortunately she does not get much exercise but she is looking into it and possibly considering the [employer] program.  I saw [name] back in the office today. Overall she is doing well except for some leg swelling at the end of the day. Denies any chest pain or worsening shortness of breath. EKG looks unchanged.  02/26/2016  [name] returns today for follow-up. Over the past year she is done fairly well. She denies any chest pain or worsening shortness of breath. Initially blood pressure was elevated 158/78 however recheck came down to 130/70. She is on low-dose aspirin. She is due for recheck of her lipid profile. She has not had any worsening palpitations.   03/11/2017  [name] returns today for follow-up.  She is done well over the past year.  She is since moved into [facility] [place].  She is very happy there.  This is an assisted living facility.  She denies any chest pain or worsening shortness of breath.  Blood pressure is improved today.  She gets annual lipid profiles and is due for that as well.  She denies any problems with her medications.  She says she is sleeping well.  The only issue is she had some imbalance however she has been working with physical therapy on that.  03/18/2018  Mrs. Gieselman is seen today in routine follow-up.  Overall she is doing well without new complaints.  She continues to live at [facility] [place].  She says she is not as active as  she like to be.  Weight has fluctuated some.  She denies any chest pain or shortness of breath.  EKG shows sinus rhythm.  Blood pressure is well controlled today.  03/24/2019  Ms. Reesor is seen today in follow-up.  She denies any new chest pain or worsening shortness of breath.  Her most recent lipid profile showed total cholesterol 168, triglycerides 102, HDL 53 and LDL 95.  This is actually at target with goal LDL less than 100.  The study however was almost a year ago.  She remains on a atorvastatin 20 mg.  Blood pressure is significantly elevated today at 179/84.  She brought a list of blood pressure readings that [facility] taken [facility] indicating her blood pressure was 120/80 generally by manual cuff.  Her personal automatic cuff however shows a blood pressure to be about 542-706 systolic.  She also is complaining of some lower extremity edema, particularly pedal edema which is worsened recently and does not necessarily go away with elevating her feet at night.  03/24/2020  Shandrell returns today for follow-up.  She continues to struggle with lower extremity edema and I think suboptimal blood pressure control.  I had previously put her on some thiazide diuretic however she does not seem like she has had significant improvement with that.  Pressure here today was 152/84.  She says at home she gets around 237 systolic.  At [facility] they get around 120 but she doubts  that number.  06/15/2020  [name] is seen today for follow-up.  Unfortunately she was recently hospitalized for a pleural effusion.  Subsequently after thoracentesis she was found to have pulmonary nodule with evidence for metastases.  She was diagnosed with adenocarcinoma.  There also appears to be some extension into the brain.  She is seeing Dr. Earlie Server for this.  She could not tolerate irbesartan but it may have been side effects related to this.  Nonetheless she is not been on any blood pressure medications and  is hypertensive today.  He continues to have issues with swelling is not responding to Lasix.  PMHx:  Past Medical History:  Diagnosis Date  . Bruit    Abdominal bruit - Abdominal aorta/renal duplex Doppler evaluation 12/07/03 -Mildly abnormal evaluation. 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 This is consistant w/median arcuate ligament compression syndrome. *Right & Left Kidney: Essentially equal in size, symmetrical in shape w/no significant abnormalities visualized. *Right & Left Renal Arteries: No significant abnormalities.  . Edema extremities    LE edema   . H/O myocardial perfusion scan 02/20/00   To rule out ischemia - Negative adequate Bruce protocol exercise stress test with a deconditioned exercise response and normal static myocardial perfusion images with EF calculated by QGS of 77%. Represents a low risk study.  . Hyperlipidemia   . Hypertension   . Osteoarthritis   . Osteoporosis   . Pleural effusion 04/2020  . Valvular heart disease    Mild valvular heart disease by Echo in 2010, all mild and symptomatic, including concentric LVH, MR, TR, and AI with pulmonary artery pressure of 32. EF was normal.    Past Surgical History:  Procedure Laterality Date  . Abdominal aorta/Renal duplex Doppler Evaluation  12/07/03   For abdominal bruit. Mildly abnormal evaluation. (See bruit in medical history)  . BREAST EXCISIONAL BIOPSY Left 1966  . BREAST EXCISIONAL BIOPSY Left 1999  . CATARACT EXTRACTION  2003  . COLONOSCOPY  10/22/2004  . DEXA Bone Scan  06/23/2013  . IR PERC PLEURAL DRAIN W/INDWELL CATH W/IMG GUIDE  05/10/2020  . IR THORACENTESIS ASP PLEURAL SPACE W/IMG GUIDE  05/04/2020    FAMHx:  Family History  Problem Relation Age of Onset  . Leukemia Mother 64  . Cancer Father        esophagial cancer  . Heart failure Maternal Grandmother   . Tuberculosis Maternal Grandfather   . Heart disease Paternal Grandmother   . Cancer Paternal Grandfather      SOCHx:   reports that she quit smoking about 58 years ago. Her smoking use included cigarettes. She has a 1.50 pack-year smoking history. She has never used smokeless tobacco. She reports current alcohol use of about 2.0 standard drinks of alcohol per week. She reports that she does not use drugs.  ALLERGIES:  Allergies  Allergen Reactions  . Irbesartan Nausea Only    ROS: Pertinent items noted in HPI and remainder of comprehensive ROS otherwise negative.  HOME MEDS: Current Outpatient Medications  Medication Sig Dispense Refill  . acetaminophen (TYLENOL) 325 MG tablet Take 2 tablets (650 mg total) by mouth every 6 (six) hours as needed for mild pain (or Fever >/= 101). 20 tablet 0  . aspirin 81 MG tablet Take 81 mg by mouth daily.    [name] atorvastatin (LIPITOR) 20 MG tablet TAKE 1 TABLET BY MOUTH EVERY DAY (Patient taking differently: Take 20 mg by mouth at bedtime.) 90 tablet 1  . furosemide (LASIX) 20 MG  tablet Take 20 mg by mouth daily.    . metoprolol succinate (TOPROL-XL) 50 MG 24 hr tablet Take 50 mg by mouth daily. Take with or immediately following a meal.    . Multiple Vitamin (MULTIVITAMIN) tablet Take 1 tablet by mouth daily.    [name] osimertinib mesylate (TAGRISSO) 80 MG tablet Take 1 tablet (80 mg total) by mouth daily. Days 1-28. 30 tablet 3  . potassium chloride SA (KLOR-CON) 20 MEQ tablet Take 20 mEq by mouth every morning.    . zinc oxide 20 % ointment Apply 1 application topically as needed for irritation. apply to buttocks/peri, topical, As Needed, To buttocks after every incontinent episode and as needed for redness. May keep at bedside.     No current facility-administered medications for this visit.    LABS/IMAGING: No results found for this or any previous visit (from the past 48 hour(s)). No results found.  VITALS: BP (!) 167/77   Pulse 90   Ht 5' 3.5" (1.613 m)   Wt 128 lb (58.1 kg)   SpO2 95%   BMI 22.32 kg/m   EXAM: General appearance: alert and no  distress Neck: no carotid bruit, no JVD and thyroid not enlarged, symmetric, no tenderness/mass/nodules Lungs: clear to auscultation bilaterally Heart: regular rate and rhythm and systolic murmur: systolic ejection 2/6, crescendo at 2nd right intercostal space Abdomen: soft, non-tender; bowel sounds normal; no masses,  no organomegaly Extremities: extremities normal, atraumatic, no cyanosis or edema Pulses: 2+ and symmetric Skin: Skin color, texture, turgor normal. No rashes or lesions Neurologic: Grossly normal Psych: Pleasant  EKG: Normal sinus rhythm at 78-personally reviewed  ASSESSMENT: 1. New diagnosis of metastatic NSCLC/pleural effusion 2. Hypertension 3. Dyslipidemia 4. Asymptomatic PVC's - no significant recurrence 5. History of valvular heart disease.  6. Bilateral LE edema  PLAN: 1.   Mrs. Ryer has a new diagnosis of metastatic lung cancer with findings in the brain and had pleural effusion.  She is undergoing treatment with Dr. Earlie Server.  Blood pressure is poorly controlled.  She did not tolerate the irbesartan.  I recommended going back to amlodipine which she did well with before.  We will also add Aldactone 25 mg daily for additional diuresis.  She can use the Lasix as needed and stop her daily potassium supplements.  She already has repeat labs ordered to the cancer center.  Plan follow-up with me in 6 months or sooner as necessary.  Pixie Casino, MD, [facility], [place] Director of the Advanced Lipid Disorders &  Cardiovascular Risk Reduction Clinic Diplomate of the American Board of Clinical Lipidology Attending Cardiologist  Direct Dial: [phone]  Fax: [phone]  Website:  [email or web address]   Nadean Corwin Bradly Sangiovanni 06/15/2020, 2:20 PM

## 2020-06-15 NOTE — Patient Instructions (Signed)
Medication Instructions:  Start amlodipine 5mg  once daily.  Spironolactone 25mg  once daily.  Stop taking potassium.  *If you need a refill on your cardiac medications before your next appointment, please call your pharmacy*   Follow-Up: At [facility], you and your health needs are our priority.  As part of our continuing mission to provide you with exceptional heart care, we have created designated Provider Care Teams.  These Care Teams include your primary Cardiologist (physician) and Advanced Practice Providers (APPs -  Physician Assistants and Nurse Practitioners) who all work together to provide you with the care you need, when you need it.  We recommend signing up for the patient portal called "MyChart".  Sign up information is provided on this After Visit Summary.  MyChart is used to connect with patients for Virtual Visits (Telemedicine).  Patients are able to view lab/test results, encounter notes, upcoming appointments, etc.  Non-urgent messages can be sent to your provider as well.   To learn more about what you can do with MyChart, go to [email or web address].    Your next appointment:   2 month(s)  The format for your next appointment:   In Person  Provider:   K. Mali Hilty, MD

## 2020-06-17 LAB — ACID FAST CULTURE WITH REFLEXED SENSITIVITIES (MYCOBACTERIA): Acid Fast Culture: NEGATIVE

## 2020-06-20 ENCOUNTER — Non-Acute Institutional Stay (SKILLED_NURSING_FACILITY): Payer: Medicare PPO | Admitting: Nurse Practitioner

## 2020-06-20 ENCOUNTER — Encounter: Payer: Self-pay | Admitting: Nurse Practitioner

## 2020-06-20 DIAGNOSIS — E785 Hyperlipidemia, unspecified: Secondary | ICD-10-CM

## 2020-06-20 DIAGNOSIS — J90 Pleural effusion, not elsewhere classified: Secondary | ICD-10-CM

## 2020-06-20 DIAGNOSIS — M810 Age-related osteoporosis without current pathological fracture: Secondary | ICD-10-CM | POA: Diagnosis not present

## 2020-06-20 DIAGNOSIS — E871 Hypo-osmolality and hyponatremia: Secondary | ICD-10-CM | POA: Diagnosis not present

## 2020-06-20 DIAGNOSIS — I10 Essential (primary) hypertension: Secondary | ICD-10-CM

## 2020-06-20 DIAGNOSIS — R609 Edema, unspecified: Secondary | ICD-10-CM | POA: Diagnosis not present

## 2020-06-20 DIAGNOSIS — K219 Gastro-esophageal reflux disease without esophagitis: Secondary | ICD-10-CM

## 2020-06-20 DIAGNOSIS — E876 Hypokalemia: Secondary | ICD-10-CM

## 2020-06-20 DIAGNOSIS — R011 Cardiac murmur, unspecified: Secondary | ICD-10-CM | POA: Diagnosis not present

## 2020-06-20 NOTE — Assessment & Plan Note (Signed)
SOB/mild valvular heart disease

## 2020-06-20 NOTE — Assessment & Plan Note (Signed)
Blood pressure is controlled, continue Metoprolol.

## 2020-06-20 NOTE — Assessment & Plan Note (Signed)
off Omeprazole per patient's request, poor appetite, early satiety, f/u oncology eval for mass. Hgb 14.7 06/07/20

## 2020-06-20 NOTE — Assessment & Plan Note (Signed)
the patient requested to be off Fosamax.

## 2020-06-20 NOTE — Assessment & Plan Note (Signed)
P 3.3, Mg 2.2 05/22/20

## 2020-06-20 NOTE — Progress Notes (Unsigned)
Location:    [facility] Room Number: 4 Place of Service:  SNF (31) Provider: Marlana Latus NP  Cari Caraway, MD  Patient Care Team: Cari Caraway, MD as PCP - General (Family Medicine) Valrie Hart, RN as Oncology Nurse Navigator (Oncology)  Extended Emergency Contact Information Primary Emergency Contact: swiftmick, pat Mobile Phone: [phone] Relation: Friend Secondary Emergency Contact: Genice Rouge Address: [place],  [address] [place] Phone: [phone] Work Phone: [phone] Relation: Other  Code Status:  DNR Goals of care: Advanced Directive information Advanced Directives 05/17/2020  Does Patient Have a Medical Advance Directive? Yes  Type of Advance Directive [place]  Does patient want to make changes to medical advance directive? No - Patient declined  Copy of [place] in Chart? Yes - validated most recent copy scanned in chart (See row information)     Chief Complaint  Patient presents with  . Medical Management of Chronic Issues    HPI:  Pt is a 87 y.o. female seen today for medical management of chronic diseases.               Hospitalized 05/01/20-05/11/20 for a large left pleural effusion, s/p thoracentesis x2, drained 1.5L, Cytology showed adenocarcinoma, CT showed left upper lobe mass invades the mediastinum at the level of the aortic or pulmonayr window, concerning for lymphangitic spread of disease, pleurX placed, drain prn daily up to 1L/day, call for less than 152ml daily x 3 consecutive drains qod for possible removal of the catheter, f/u pulmonology Dr. Valeta Harms,  oncology Dr. Julien Nordmann, taking palliative chemotherapy/immunotherapy, Tagrisso HTN, takes Metoprolol. Hyperlipidemia, takes Atorvastatin SOB/mild valvular heart disease GERD, off Omeprazole per patient's request, poor  appetite, early satiety, f/u oncology eval for mass. Hgb 14.7 06/07/20 Hypophosphatemia, P 3.3, Mg 2.2 05/22/20 Hypokalemia, corrected, K 4.1 06/07/20 Hyponatremia, corrected, Na 138 06/07/20 Edema BLE BNP normal, EF 60-65%, negative DVT venous US. Takes Furosemide,  Bun/creat  OP the patient requested to be off Fosamax.     Past Medical History:  Diagnosis Date  . Bruit    Abdominal bruit - Abdominal aorta/renal duplex Doppler evaluation 12/07/03 -Mildly abnormal evaluation. 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 This is consistant w/median arcuate ligament compression syndrome. *Right & Left Kidney: Essentially equal in size, symmetrical in shape w/no significant abnormalities visualized. *Right & Left Renal Arteries: No significant abnormalities.  . Edema extremities    LE edema   . H/O myocardial perfusion scan 02/20/00   To rule out ischemia - Negative adequate Bruce protocol exercise stress test with a deconditioned exercise response and normal static myocardial perfusion images with EF calculated by QGS of 77%. Represents a low risk study.  . Hyperlipidemia   . Hypertension   . Osteoarthritis   . Osteoporosis   . Pleural effusion 04/2020  . Valvular heart disease    Mild valvular heart disease by Echo in 2010, all mild and symptomatic, including concentric LVH, MR, TR, and AI with pulmonary artery pressure of 32. EF was normal.   Past Surgical History:  Procedure Laterality Date  . Abdominal aorta/Renal duplex Doppler Evaluation  12/07/03   For abdominal bruit. Mildly abnormal evaluation. (See bruit in medical history)  . BREAST EXCISIONAL BIOPSY Left 1966  . BREAST EXCISIONAL BIOPSY Left 1999  . CATARACT EXTRACTION  2003  . COLONOSCOPY  10/22/2004  . DEXA Bone Scan  06/23/2013  .  IR PERC PLEURAL DRAIN W/INDWELL CATH W/IMG GUIDE  05/10/2020  . IR THORACENTESIS ASP PLEURAL SPACE W/IMG GUIDE  05/04/2020     Allergies  Allergen Reactions  . Irbesartan Nausea Only    Allergies as of 06/20/2020      Reactions   Irbesartan Nausea Only      Medication List       Accurate as of June 20, 2020 11:59 PM. If you have any questions, ask your nurse or doctor.        acetaminophen 325 MG tablet Commonly known as: TYLENOL Take 2 tablets (650 mg total) by mouth every 6 (six) hours as needed for mild pain (or Fever >/= 101).   amLODipine 2.5 MG tablet Commonly known as: NORVASC Take 2.5 mg by mouth daily.   amLODipine 5 MG tablet Commonly known as: NORVASC Take 1 tablet (5 mg total) by mouth daily.   aspirin 81 MG tablet Take 81 mg by mouth daily.   atorvastatin 20 MG tablet Commonly known as: LIPITOR TAKE 1 TABLET BY MOUTH EVERY DAY What changed: when to take this   furosemide 20 MG tablet Commonly known as: LASIX Take 1 tablet (20 mg total) by mouth as needed for fluid or edema.   metoprolol succinate 50 MG 24 hr tablet Commonly known as: TOPROL-XL Take 50 mg by mouth daily. Take with or immediately following a meal.   multivitamin tablet Take 1 tablet by mouth daily.   osimertinib mesylate 80 MG tablet Commonly known as: TAGRISSO Take 1 tablet (80 mg total) by mouth daily. Days 1-28.   spironolactone 25 MG tablet Commonly known as: ALDACTONE Take 1 tablet (25 mg total) by mouth daily.   zinc oxide 20 % ointment Apply 1 application topically as needed for irritation. apply to buttocks/peri, topical, As Needed, To buttocks after every incontinent episode and as needed for redness. May keep at bedside.       Review of Systems  Constitutional: Positive for appetite change and fatigue. Negative for diaphoresis and fever.       Weight loss #5Ibs in the past 5 days.   HENT: Positive for hearing loss. Negative for congestion and trouble swallowing.   Eyes: Negative for visual disturbance.  Respiratory: Positive for shortness of breath. Negative for cough and  wheezing.   Cardiovascular: Positive for leg swelling. Negative for chest pain and palpitations.  Gastrointestinal: Negative for abdominal pain and constipation.       Poor appetite, early satiety.   Genitourinary: Negative for dysuria, hematuria and urgency.  Musculoskeletal: Positive for gait problem.  Skin: Negative for color change.  Neurological: Negative for speech difficulty, weakness, light-headedness and headaches.  Psychiatric/Behavioral: Negative for behavioral problems and sleep disturbance. The patient is not nervous/anxious.     Immunization History  Administered Date(s) Administered  . Fluad Quad(high Dose 65+) 01/06/2019, 02/08/2020  . Influenza Split 08/14/2010, 01/16/2012, 02/02/2013, 01/24/2015, 02/01/2016, 02/19/2017, 01/28/2018  . Influenza, High Dose Seasonal PF 01/24/2015, 02/01/2016, 01/28/2018  . Influenza-Unspecified 02/01/2016  . Moderna Sars-Covid-2 Vaccination 05/17/2019, 06/14/2019  . Pneumococcal Conjugate-13 08/11/2013, 09/03/2013  . Pneumococcal Polysaccharide-23 05/28/2004  . Td 07/04/2000  . Tdap 07/04/2009, 10/18/2019  . Unspecified SARS-COV-2 Vaccination 03/27/2020  . Zoster 07/02/2005   Pertinent  Health Maintenance Due  Topic Date Due  . INFLUENZA VACCINE  Completed  . DEXA SCAN  Completed  . PNA vac Low Risk Adult  Completed   No flowsheet data found. Functional Status Survey:    Vitals:   06/20/20 1456  BP: (!) 148/79  Pulse: 76  Resp: 20  Temp: 97.7 F (36.5 C)  SpO2: 93%  Weight: 129 lb (58.5 kg)  Height: 5\' 4"  (1.626 m)   Body mass index is 22.14 kg/m. Physical Exam Vitals and nursing note reviewed.  Constitutional:      Appearance: Normal appearance.  HENT:     Head: Normocephalic and atraumatic.     Mouth/Throat:     Mouth: Mucous membranes are moist.  Eyes:     Extraocular Movements: Extraocular movements intact.     Conjunctiva/sclera: Conjunctivae normal.     Pupils: Pupils are equal, round, and reactive to  light.  Cardiovascular:     Rate and Rhythm: Normal rate and regular rhythm.     Heart sounds: Murmur heard.    Pulmonary:     Effort: Pulmonary effort is normal.     Breath sounds: Rales present. No wheezing or rhonchi.     Comments: L pleurx Abdominal:     General: Bowel sounds are normal.     Palpations: Abdomen is soft.     Tenderness: There is no abdominal tenderness.  Musculoskeletal:     Cervical back: Normal range of motion and neck supple.     Right lower leg: Edema present.     Left lower leg: Edema present.     Comments: 1-2+  edema BLE  Skin:    General: Skin is warm and dry.  Neurological:     General: No focal deficit present.     Mental Status: She is alert and oriented to person, place, and time. Mental status is at baseline.     Gait: Gait abnormal.  Psychiatric:        Mood and Affect: Mood normal.        Behavior: Behavior normal.        Thought Content: Thought content normal.        Judgment: Judgment normal.     Labs reviewed: Recent Labs    05/09/20 0319 05/10/20 0322 05/11/20 0246 05/17/20 1300 06/07/20 1306  NA 136 137 135 136 138  K 4.3 4.2 3.9 3.4* 4.1  CL 106 105 104 99 105  CO2 26 23 24 26 26   GLUCOSE 108* 112* 115* 114* 103*  BUN 8 11 12 16 11   CREATININE 0.79 0.77 0.81 0.90 0.76  CALCIUM 9.6 9.8 9.6 10.3 10.3  MG 2.1 2.1 2.1  --   --   PHOS 3.1 3.0 2.7  --   --    Recent Labs    05/11/20 0246 05/17/20 1300 06/07/20 1306  AST 19 27 22   ALT 17 21 17   ALKPHOS 48 76 77  BILITOT 0.8 0.7 0.5  PROT 5.2* 6.7 6.7  ALBUMIN 2.4* 3.2* 3.0*   Recent Labs    05/11/20 0246 05/17/20 1300 06/07/20 1306  WBC 11.1* 13.2* 12.0*  NEUTROABS 8.1* 10.9* 10.0*  HGB 14.6 15.2* 14.7  HCT 42.3 44.9 44.4  MCV 87.9 88.4 89.5  PLT 237 370 451*   No results found for: TSH No results found for: HGBA1C Lab Results  Component Value Date   CHOL 139 04/14/2020   HDL 53 04/14/2020   LDLCALC 68 04/14/2020   TRIG 97 04/14/2020   CHOLHDL 2.6  04/14/2020    Significant Diagnostic Results in last 30 days:  MR Brain W Wo Contrast  Result Date: 06/07/2020 CLINICAL DATA:  Non-small cell lung cancer, staging. EXAM: MRI HEAD WITHOUT AND WITH CONTRAST TECHNIQUE: Multiplanar, multiecho pulse sequences  of the brain and surrounding structures were obtained without and with intravenous contrast. CONTRAST:  71mL GADAVIST GADOBUTROL 1 MMOL/ML IV SOLN COMPARISON:  None. FINDINGS: Brain: No acute infarction, hemorrhage, hydrocephalus or extra-axial collection. Multiple enhancing lesions consistent with metastatic disease, labeled on series 16, as follows: *Right cerebellar hemisphere, 2 mm, image 40. *Right posterior temporal operculum, 10 mm, image 88. *Left parietal lobe, 5 mm, image 102. *Left parietal lobe, 5 mm, image 111. A 1.5 x 1.3 cm dural-based extra-axial enhancing lesion in the right frontal parietal convexity. No mass effect on the adjacent brain parenchyma. Remote lacunar infarcts in the bilateral cerebellar hemispheres and right caudate head. Scattered foci of T2 hyperintensity are seen within the white matter of the cerebral hemispheres, nonspecific, most likely related to chronic microangiopathic changes. Moderate parenchymal volume loss. Vascular: Normal flow voids. Skull and upper cervical spine: Normal marrow signal. Sinuses/Orbits: Bilateral lens surgery. Paranasal sinuses are essentially clear. IMPRESSION: 1. Multiple enhancing lesions consistent with metastatic disease. 2. A 1.5 x 1.3 cm dural-based extra-axial enhancing lesion in the right frontal parietal convexity, likely a meningioma. However, given presence of metastatic disease to the brain, the possibility of secondary lesion cannot be excluded. 3. Moderate parenchymal volume loss and chronic microangiopathic changes. Remote lacunar infarcts in the bilateral cerebellar hemispheres and right caudate head. Electronically Signed   By: Pedro Earls M.D.   On: 06/07/2020  13:17   NM PET Image Initial (PI) Skull Base To Thigh  Result Date: 05/31/2020 CLINICAL DATA:  Initial treatment strategy for adenocarcinoma left upper lobe with malignant pleural effusion. EXAM: NUCLEAR MEDICINE PET SKULL BASE TO THIGH TECHNIQUE: 6.2 mCi F-18 FDG was injected intravenously. Full-ring PET imaging was performed from the skull base to thigh after the radiotracer. CT data was obtained and used for attenuation correction and anatomic localization. Fasting blood glucose: 96 mg/dl COMPARISON:  Chest CT 05/02/2020 FINDINGS: Mediastinal blood pool activity: SUV max 2.53 Liver activity: SUV max NA NECK: No hypermetabolic lymph nodes in the neck. Incidental CT findings: none CHEST: The medial left upper lobe lung mass is hypermetabolic with SUV max of 4.62. 9 mm left hilar node has an SUV max of 5.69. 13 mm subcarinal lymph node has an SUV max of 7.03 Hypermetabolic supraclavicular nodes on the right side. The largest node measures 7.5 mm and has an SUV max of 4.64. Fairly extensive pleural disease with SUV max ranging from 3.0-4.0. No hypermetabolic metastatic pulmonary nodules are identified. Incidental CT findings: Left-sided PleurX drainage catheter in place. Persistent small right pleural effusion. ABDOMEN/PELVIS: 2 small adjacent celiac axis lymph nodes are hypermetabolic with SUV max of 5.00. No other abdominal/pelvic metastatic disease is identified. No hepatic or adrenal gland lesions. Incidental CT findings: Stable large bilateral renal cysts. SKELETON: No focal hypermetabolic activity to suggest skeletal metastasis. Incidental CT findings: none IMPRESSION: 1. 4.5 cm medial left upper lobe mass is hypermetabolic and consistent with primary lung neoplasm. Associated hypermetabolic right supraclavicular, left hilar and subcarinal adenopathy. 2. Diffuse left-sided hypermetabolic pleural disease and associated malignant left pleural effusion. 3. Two small lymph nodes near the SMA are hypermetabolic  and consistent with metastatic adenopathy. No other abdominal/pelvic metastatic disease is identified. 4. No findings for osseous metastatic disease. Electronically Signed   By: Marijo Sanes M.D.   On: 05/31/2020 10:05    Assessment/Plan Essential hypertension Blood pressure is controlled, continue Metoprolol.  Hyperlipidemia Continue Atorvastatin.   Murmur SOB/mild valvular heart disease   GERD (gastroesophageal reflux disease) off Omeprazole  per patient's request, poor appetite, early satiety, f/u oncology eval for mass. Hgb 14.7 06/07/20   Hypophosphatemia P 3.3, Mg 2.2 05/22/20   Hypokalemia corrected, K 4.1 06/07/20   Hyponatremia corrected, Na 138 06/07/20   Edema Edema BLE BNP normal, EF 60-65%, negative DVT venous US. Takes Furosemide,  Bun/creat    OP (osteoporosis) the patient requested to be off Fosamax.    Pleural effusion Hospitalized 05/01/20-05/11/20 for a large left pleural effusion, s/p thoracentesis x2, drained 1.5L, Cytology showed adenocarcinoma, CT showed left upper lobe mass invades the mediastinum at the level of the aortic or pulmonayr window, concerning for lymphangitic spread of disease, pleurX placed, drain prn daily up to 1L/day, call for less than 185ml daily x 3 consecutive drains qod for possible removal of the catheter, f/u pulmonology Dr. Valeta Harms,  oncology Dr. Julien Nordmann, taking palliative chemotherapy/immunotherapy, [place]   Family/ staff Communication: plan of care reviewed with the patient and charge nurse.   Labs/tests ordered:  none  Time spend 35 minutes.

## 2020-06-20 NOTE — Assessment & Plan Note (Signed)
corrected, Na 138 06/07/20

## 2020-06-20 NOTE — Assessment & Plan Note (Signed)
Hospitalized 05/01/20-05/11/20 for a large left pleural effusion, s/p thoracentesis x2, drained 1.5L, Cytology showed adenocarcinoma, CT showed left upper lobe mass invades the mediastinum at the level of the aortic or pulmonayr window, concerning for lymphangitic spread of disease, pleurX placed, drain prn daily up to 1L/day, call for less than 122ml daily x 3 consecutive drains qod for possible removal of the catheter, f/u pulmonology Dr. Valeta Harms,  oncology Dr. Julien Nordmann, taking palliative chemotherapy/immunotherapy, [name]

## 2020-06-20 NOTE — Assessment & Plan Note (Signed)
Edema BLE BNP normal, EF 60-65%, negative DVT venous US. Takes Furosemide,  Bun/creat

## 2020-06-20 NOTE — Assessment & Plan Note (Signed)
Continue Atorvastatin

## 2020-06-20 NOTE — Assessment & Plan Note (Signed)
corrected, K 4.1 06/07/20

## 2020-06-21 ENCOUNTER — Encounter: Payer: Self-pay | Admitting: Nurse Practitioner

## 2020-06-27 ENCOUNTER — Encounter: Payer: Self-pay | Admitting: Nurse Practitioner

## 2020-06-27 ENCOUNTER — Non-Acute Institutional Stay (SKILLED_NURSING_FACILITY): Payer: Medicare PPO | Admitting: Nurse Practitioner

## 2020-06-27 DIAGNOSIS — I10 Essential (primary) hypertension: Secondary | ICD-10-CM

## 2020-06-27 DIAGNOSIS — B372 Candidiasis of skin and nail: Secondary | ICD-10-CM

## 2020-06-27 DIAGNOSIS — R233 Spontaneous ecchymoses: Secondary | ICD-10-CM

## 2020-06-27 DIAGNOSIS — R609 Edema, unspecified: Secondary | ICD-10-CM | POA: Diagnosis not present

## 2020-06-27 DIAGNOSIS — R011 Cardiac murmur, unspecified: Secondary | ICD-10-CM | POA: Diagnosis not present

## 2020-06-27 DIAGNOSIS — C3492 Malignant neoplasm of unspecified part of left bronchus or lung: Secondary | ICD-10-CM

## 2020-06-27 DIAGNOSIS — E782 Mixed hyperlipidemia: Secondary | ICD-10-CM

## 2020-06-27 NOTE — Progress Notes (Signed)
Location:    [facility] Room Number: 4 Place of Service:  SNF (31) Provider:  Marlana Latus NP  Cari Caraway, MD  Patient Care Team: Cari Caraway, MD as PCP - General (Family Medicine) Valrie Hart, RN as Oncology Nurse Navigator (Oncology)  Extended Emergency Contact Information Primary Emergency Contact: swiftmick, pat Mobile Phone: [phone] Relation: Friend Secondary Emergency Contact: Genice Rouge Address: [place], [place] [address] [place] Phone: [phone] Work Phone: [phone] Relation: Other  Code Status:  DNR Managed Care Goals of care: Advanced Directive information Advanced Directives 05/17/2020  Does Patient Have a Medical Advance Directive? Yes  Type of Advance Directive [place]  Does patient want to make changes to medical advance directive? No - Patient declined  Copy of [place] in Chart? Yes - validated most recent copy scanned in chart (See row information)     Chief Complaint  Patient presents with  . Acute Visit    Skin rash    HPI:  Pt is a 87 y.o. female seen today for an acute visit for reddened are under the left breast skin fold with satellite pattern rash, onset and duration are uncertain.    Hospitalized 05/01/20-05/11/20 for a large left pleural effusion, s/p thoracentesis x2, drained 1.5L, Cytology showed adenocarcinoma, CT showed left upper lobe mass invades the mediastinum at the level of the aortic or pulmonayr window, concerning for lymphangitic spread of disease, pleurX placed, f/u pulmonology Dr. Valeta Harms,  oncology Dr. Julien Nordmann, taking palliative chemotherapy/immunotherapy, Tagrisso HTN, takes Metoprolol. Hyperlipidemia, takes Atorvastatin SOB/mild valvular heart disease Hypophosphatemia, P3.3, Mg 2.2 05/22/20 Edema BLE BNP normal, EF 60-65%, negative DVT  venous US.Takes Furosemide, Bun/creat 11/0.76 06/07/20    Past Medical History:  Diagnosis Date  . Bruit    Abdominal bruit - Abdominal aorta/renal duplex Doppler evaluation 12/07/03 -Mildly abnormal evaluation. 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 This is consistant w/median arcuate ligament compression syndrome. *Right & Left Kidney: Essentially equal in size, symmetrical in shape w/no significant abnormalities visualized. *Right & Left Renal Arteries: No significant abnormalities.  . Edema extremities    LE edema   . H/O myocardial perfusion scan 02/20/00   To rule out ischemia - Negative adequate Bruce protocol exercise stress test with a deconditioned exercise response and normal static myocardial perfusion images with EF calculated by QGS of 77%. Represents a low risk study.  . Hyperlipidemia   . Hypertension   . Osteoarthritis   . Osteoporosis   . Pleural effusion 04/2020  . Valvular heart disease    Mild valvular heart disease by Echo in 2010, all mild and symptomatic, including concentric LVH, MR, TR, and AI with pulmonary artery pressure of 32. EF was normal.   Past Surgical History:  Procedure Laterality Date  . Abdominal aorta/Renal duplex Doppler Evaluation  12/07/03   For abdominal bruit. Mildly abnormal evaluation. (See bruit in medical history)  . BREAST EXCISIONAL BIOPSY Left 1966  . BREAST EXCISIONAL BIOPSY Left 1999  . CATARACT EXTRACTION  2003  . COLONOSCOPY  10/22/2004  . DEXA Bone Scan  06/23/2013  . IR PERC PLEURAL DRAIN W/INDWELL CATH W/IMG GUIDE  05/10/2020  . IR THORACENTESIS ASP PLEURAL SPACE W/IMG GUIDE  05/04/2020    Allergies  Allergen Reactions  . Irbesartan Nausea Only    Allergies as of 06/27/2020      Reactions   Irbesartan Nausea  Only      Medication List       Accurate as of June 27, 2020  3:04 PM. If you have any questions, ask your nurse or doctor.        acetaminophen 325 MG tablet Commonly known as:  TYLENOL Take 2 tablets (650 mg total) by mouth every 6 (six) hours as needed for mild pain (or Fever >/= 101).   amLODipine 2.5 MG tablet Commonly known as: NORVASC Take 2.5 mg by mouth daily. What changed: Another medication with the same name was removed. Continue taking this medication, and follow the directions you see here. Changed by: Shyia Fillingim X Uchechukwu Dhawan, NP   aspirin 81 MG tablet Take 81 mg by mouth daily.   atorvastatin 20 MG tablet Commonly known as: LIPITOR TAKE 1 TABLET BY MOUTH EVERY DAY   furosemide 20 MG tablet Commonly known as: LASIX Take 1 tablet (20 mg total) by mouth as needed for fluid or edema.   metoprolol succinate 50 MG 24 hr tablet Commonly known as: TOPROL-XL Take 50 mg by mouth daily. Take with or immediately following a meal.   multivitamin tablet Take 1 tablet by mouth daily.   osimertinib mesylate 80 MG tablet Commonly known as: TAGRISSO Take 1 tablet (80 mg total) by mouth daily. Days 1-28.   spironolactone 25 MG tablet Commonly known as: ALDACTONE Take 1 tablet (25 mg total) by mouth daily.   zinc oxide 20 % ointment Apply 1 application topically as needed for irritation. apply to buttocks/peri, topical, As Needed, To buttocks after every incontinent episode and as needed for redness. May keep at bedside.       Review of Systems  Constitutional: Negative for appetite change, fatigue and fever.       Persisted poor appetite and fatigue.   HENT: Positive for hearing loss. Negative for congestion and trouble swallowing.   Eyes: Negative for visual disturbance.  Respiratory: Positive for shortness of breath. Negative for cough and wheezing.   Cardiovascular: Positive for leg swelling. Negative for chest pain and palpitations.  Gastrointestinal: Negative for abdominal pain and constipation.       Poor appetite, early satiety.   Genitourinary: Negative for dysuria, hematuria and urgency.  Musculoskeletal: Positive for gait problem.  Skin: Positive  for rash. Negative for color change.  Neurological: Negative for speech difficulty, weakness, light-headedness and headaches.  Psychiatric/Behavioral: Negative for behavioral problems and sleep disturbance. The patient is not nervous/anxious.     Immunization History  Administered Date(s) Administered  . Fluad Quad(high Dose 65+) 01/06/2019, 02/08/2020  . Influenza Split 08/14/2010, 01/16/2012, 02/02/2013, 01/24/2015, 02/01/2016, 02/19/2017, 01/28/2018  . Influenza, High Dose Seasonal PF 01/24/2015, 02/01/2016, 01/28/2018  . Influenza-Unspecified 02/01/2016  . Moderna Sars-Covid-2 Vaccination 05/17/2019, 06/14/2019  . Pneumococcal Conjugate-13 08/11/2013, 09/03/2013  . Pneumococcal Polysaccharide-23 05/28/2004  . Td 07/04/2000  . Tdap 07/04/2009, 10/18/2019  . Unspecified SARS-COV-2 Vaccination 03/27/2020  . Zoster 07/02/2005   Pertinent  Health Maintenance Due  Topic Date Due  . INFLUENZA VACCINE  Completed  . DEXA SCAN  Completed  . PNA vac Low Risk Adult  Completed   No flowsheet data found. Functional Status Survey:    Vitals:   06/27/20 1423  BP: 140/76  Pulse: 76  Resp: 20  Temp: 97.8 F (36.6 C)  SpO2: 96%  Weight: 129 lb (58.5 kg)  Height: 5\' 4"  (1.626 m)   Body mass index is 22.14 kg/m. Physical Exam Vitals and nursing note reviewed.  Constitutional:  Appearance: Normal appearance.  HENT:     Head: Normocephalic and atraumatic.     Mouth/Throat:     Mouth: Mucous membranes are moist.  Eyes:     Extraocular Movements: Extraocular movements intact.     Conjunctiva/sclera: Conjunctivae normal.     Pupils: Pupils are equal, round, and reactive to light.  Cardiovascular:     Rate and Rhythm: Normal rate and regular rhythm.     Heart sounds: Murmur heard.    Pulmonary:     Effort: Pulmonary effort is normal.     Breath sounds: Rales present. No wheezing or rhonchi.     Comments: L pleurx Abdominal:     General: Bowel sounds are normal.      Palpations: Abdomen is soft.     Tenderness: There is no abdominal tenderness.  Musculoskeletal:     Cervical back: Normal range of motion and neck supple.     Right lower leg: Edema present.     Left lower leg: Edema present.     Comments: 1-2+  edema BLE  Skin:    General: Skin is warm and dry.     Findings: Erythema and rash present.     Comments: BLE below knees petechiae. Reddened area about the patient's palm sized with satellite pattern rash under the left breast skin fold.   Neurological:     General: No focal deficit present.     Mental Status: She is alert and oriented to person, place, and time. Mental status is at baseline.     Gait: Gait abnormal.  Psychiatric:        Mood and Affect: Mood normal.        Behavior: Behavior normal.        Thought Content: Thought content normal.        Judgment: Judgment normal.     Labs reviewed: Recent Labs    05/09/20 0319 05/10/20 0322 05/11/20 0246 05/17/20 1300 05/25/20 0000 06/07/20 1306  NA 136 137 135 136 137 138  K 4.3 4.2 3.9 3.4* 5.0 4.1  CL 106 105 104 99 103 105  CO2 26 23 24 26  23* 26  GLUCOSE 108* 112* 115* 114*  --  103*  BUN 8 11 12 16 12 11   CREATININE 0.79 0.77 0.81 0.90 0.8 0.76  CALCIUM 9.6 9.8 9.6 10.3 9.9 10.3  MG 2.1 2.1 2.1  --   --   --   PHOS 3.1 3.0 2.7  --   --   --    Recent Labs    05/11/20 0246 05/17/20 1300 06/07/20 1306  AST 19 27 22   ALT 17 21 17   ALKPHOS 48 76 77  BILITOT 0.8 0.7 0.5  PROT 5.2* 6.7 6.7  ALBUMIN 2.4* 3.2* 3.0*   Recent Labs    05/11/20 0246 05/17/20 1300 06/07/20 1306  WBC 11.1* 13.2* 12.0*  NEUTROABS 8.1* 10.9* 10.0*  HGB 14.6 15.2* 14.7  HCT 42.3 44.9 44.4  MCV 87.9 88.4 89.5  PLT 237 370 451*   No results found for: TSH No results found for: HGBA1C Lab Results  Component Value Date   CHOL 139 04/14/2020   HDL 53 04/14/2020   LDLCALC 68 04/14/2020   TRIG 97 04/14/2020   CHOLHDL 2.6 04/14/2020    Significant Diagnostic Results in last 30  days:  MR Brain W Wo Contrast  Result Date: 06/07/2020 CLINICAL DATA:  Non-small cell lung cancer, staging. EXAM: MRI HEAD WITHOUT AND WITH CONTRAST TECHNIQUE: Multiplanar, multiecho  pulse sequences of the brain and surrounding structures were obtained without and with intravenous contrast. CONTRAST:  26mL GADAVIST GADOBUTROL 1 MMOL/ML IV SOLN COMPARISON:  None. FINDINGS: Brain: No acute infarction, hemorrhage, hydrocephalus or extra-axial collection. Multiple enhancing lesions consistent with metastatic disease, labeled on series 16, as follows: *Right cerebellar hemisphere, 2 mm, image 40. *Right posterior temporal operculum, 10 mm, image 88. *Left parietal lobe, 5 mm, image 102. *Left parietal lobe, 5 mm, image 111. A 1.5 x 1.3 cm dural-based extra-axial enhancing lesion in the right frontal parietal convexity. No mass effect on the adjacent brain parenchyma. Remote lacunar infarcts in the bilateral cerebellar hemispheres and right caudate head. Scattered foci of T2 hyperintensity are seen within the white matter of the cerebral hemispheres, nonspecific, most likely related to chronic microangiopathic changes. Moderate parenchymal volume loss. Vascular: Normal flow voids. Skull and upper cervical spine: Normal marrow signal. Sinuses/Orbits: Bilateral lens surgery. Paranasal sinuses are essentially clear. IMPRESSION: 1. Multiple enhancing lesions consistent with metastatic disease. 2. A 1.5 x 1.3 cm dural-based extra-axial enhancing lesion in the right frontal parietal convexity, likely a meningioma. However, given presence of metastatic disease to the brain, the possibility of secondary lesion cannot be excluded. 3. Moderate parenchymal volume loss and chronic microangiopathic changes. Remote lacunar infarcts in the bilateral cerebellar hemispheres and right caudate head. Electronically Signed   By: Pedro Earls M.D.   On: 06/07/2020 13:17   NM PET Image Initial (PI) Skull Base To  Thigh  Result Date: 05/31/2020 CLINICAL DATA:  Initial treatment strategy for adenocarcinoma left upper lobe with malignant pleural effusion. EXAM: NUCLEAR MEDICINE PET SKULL BASE TO THIGH TECHNIQUE: 6.2 mCi F-18 FDG was injected intravenously. Full-ring PET imaging was performed from the skull base to thigh after the radiotracer. CT data was obtained and used for attenuation correction and anatomic localization. Fasting blood glucose: 96 mg/dl COMPARISON:  Chest CT 05/02/2020 FINDINGS: Mediastinal blood pool activity: SUV max 2.53 Liver activity: SUV max NA NECK: No hypermetabolic lymph nodes in the neck. Incidental CT findings: none CHEST: The medial left upper lobe lung mass is hypermetabolic with SUV max of 1.61. 9 mm left hilar node has an SUV max of 5.69. 13 mm subcarinal lymph node has an SUV max of 0.96 Hypermetabolic supraclavicular nodes on the right side. The largest node measures 7.5 mm and has an SUV max of 4.64. Fairly extensive pleural disease with SUV max ranging from 3.0-4.0. No hypermetabolic metastatic pulmonary nodules are identified. Incidental CT findings: Left-sided PleurX drainage catheter in place. Persistent small right pleural effusion. ABDOMEN/PELVIS: 2 small adjacent celiac axis lymph nodes are hypermetabolic with SUV max of 0.45. No other abdominal/pelvic metastatic disease is identified. No hepatic or adrenal gland lesions. Incidental CT findings: Stable large bilateral renal cysts. SKELETON: No focal hypermetabolic activity to suggest skeletal metastasis. Incidental CT findings: none IMPRESSION: 1. 4.5 cm medial left upper lobe mass is hypermetabolic and consistent with primary lung neoplasm. Associated hypermetabolic right supraclavicular, left hilar and subcarinal adenopathy. 2. Diffuse left-sided hypermetabolic pleural disease and associated malignant left pleural effusion. 3. Two small lymph nodes near the SMA are hypermetabolic and consistent with metastatic adenopathy. No  other abdominal/pelvic metastatic disease is identified. 4. No findings for osseous metastatic disease. Electronically Signed   By: Marijo Sanes M.D.   On: 05/31/2020 10:05    Assessment/Plan Candidiasis of skin reddened are under the left breast skin fold with satellite pattern rash, onset and duration are uncertain. Will apply Nystatin powder bid  to affected area x 2 weeks, avoid moist and heat.   Adenocarcinoma of left lung, stage 4 ([place]) Hospitalized 05/01/20-05/11/20 for a large left pleural effusion, s/p thoracentesis x2, drained 1.5L, Cytology showed adenocarcinoma, CT showed left upper lobe mass invades the mediastinum at the level of the aortic or pulmonayr window, concerning for lymphangitic spread of disease, pleurX placed, f/u pulmonology Dr. Valeta Harms,  oncology Dr. Julien Nordmann, taking palliative chemotherapy/immunotherapy, Tagrisso  Essential hypertension takes Metoprolol.  Hyperlipidemia takes Atorvastatin   Murmur SOB/mild valvular heart disease   Hypophosphatemia P3.3, Mg 2.2 05/22/20   Edema BLE BNP normal, EF 60-65%, negative DVT venous US.Takes Furosemide, Bun/creat 11/0.76 06/07/20  Petechiae BLE. Will f/u cancer center tomorrow    Family/ staff Communication: plan of care reviewed with the patient and charge nurse.   Labs/tests ordered:  None   Time spend 35 minutes.

## 2020-06-27 NOTE — Assessment & Plan Note (Signed)
P3.3, Mg 2.2 05/22/20

## 2020-06-27 NOTE — Assessment & Plan Note (Signed)
takes Metoprolol.

## 2020-06-27 NOTE — Assessment & Plan Note (Signed)
BLE. Will f/u cancer center tomorrow

## 2020-06-27 NOTE — Assessment & Plan Note (Signed)
Hospitalized 05/01/20-05/11/20 for a large left pleural effusion, s/p thoracentesis x2, drained 1.5L, Cytology showed adenocarcinoma, CT showed left upper lobe mass invades the mediastinum at the level of the aortic or pulmonayr window, concerning for lymphangitic spread of disease, pleurX placed, f/u pulmonology Dr. Valeta Harms,  oncology Dr. Julien Nordmann, taking palliative chemotherapy/immunotherapy, Shelley Flores

## 2020-06-27 NOTE — Assessment & Plan Note (Signed)
reddened are under the left breast skin fold with satellite pattern rash, onset and duration are uncertain. Will apply Nystatin powder bid to affected area x 2 weeks, avoid moist and heat.

## 2020-06-27 NOTE — Assessment & Plan Note (Signed)
BLE BNP normal, EF 60-65%, negative DVT venous US.Takes Furosemide, Bun/creat 11/0.76 06/07/20

## 2020-06-27 NOTE — Assessment & Plan Note (Signed)
SOB/mild valvular heart disease

## 2020-06-27 NOTE — Assessment & Plan Note (Signed)
takes Atorvastatin

## 2020-06-28 ENCOUNTER — Other Ambulatory Visit: Payer: Self-pay | Admitting: Pulmonary Disease

## 2020-06-28 ENCOUNTER — Inpatient Hospital Stay: Payer: Medicare PPO | Attending: Physician Assistant | Admitting: Internal Medicine

## 2020-06-28 ENCOUNTER — Other Ambulatory Visit: Payer: Self-pay

## 2020-06-28 ENCOUNTER — Encounter: Payer: Self-pay | Admitting: Internal Medicine

## 2020-06-28 ENCOUNTER — Telehealth: Payer: Self-pay | Admitting: *Deleted

## 2020-06-28 ENCOUNTER — Inpatient Hospital Stay: Payer: Medicare PPO

## 2020-06-28 VITALS — BP 141/65 | HR 85 | Temp 98.0°F | Resp 15 | Ht 64.0 in | Wt 124.5 lb

## 2020-06-28 DIAGNOSIS — C3412 Malignant neoplasm of upper lobe, left bronchus or lung: Secondary | ICD-10-CM | POA: Diagnosis not present

## 2020-06-28 DIAGNOSIS — J910 Malignant pleural effusion: Secondary | ICD-10-CM | POA: Insufficient documentation

## 2020-06-28 DIAGNOSIS — C7931 Secondary malignant neoplasm of brain: Secondary | ICD-10-CM | POA: Diagnosis not present

## 2020-06-28 DIAGNOSIS — R21 Rash and other nonspecific skin eruption: Secondary | ICD-10-CM | POA: Diagnosis not present

## 2020-06-28 DIAGNOSIS — C3492 Malignant neoplasm of unspecified part of left bronchus or lung: Secondary | ICD-10-CM | POA: Diagnosis not present

## 2020-06-28 DIAGNOSIS — J90 Pleural effusion, not elsewhere classified: Secondary | ICD-10-CM

## 2020-06-28 DIAGNOSIS — I10 Essential (primary) hypertension: Secondary | ICD-10-CM | POA: Diagnosis not present

## 2020-06-28 DIAGNOSIS — R590 Localized enlarged lymph nodes: Secondary | ICD-10-CM | POA: Diagnosis not present

## 2020-06-28 DIAGNOSIS — M810 Age-related osteoporosis without current pathological fracture: Secondary | ICD-10-CM | POA: Insufficient documentation

## 2020-06-28 DIAGNOSIS — E785 Hyperlipidemia, unspecified: Secondary | ICD-10-CM | POA: Diagnosis not present

## 2020-06-28 DIAGNOSIS — Z87891 Personal history of nicotine dependence: Secondary | ICD-10-CM | POA: Insufficient documentation

## 2020-06-28 DIAGNOSIS — Z5111 Encounter for antineoplastic chemotherapy: Secondary | ICD-10-CM

## 2020-06-28 DIAGNOSIS — C779 Secondary and unspecified malignant neoplasm of lymph node, unspecified: Secondary | ICD-10-CM | POA: Insufficient documentation

## 2020-06-28 LAB — CMP (CANCER CENTER ONLY)
ALT: 14 U/L (ref 0–44)
AST: 19 U/L (ref 15–41)
Albumin: 3.5 g/dL (ref 3.5–5.0)
Alkaline Phosphatase: 64 U/L (ref 38–126)
Anion gap: 6 (ref 5–15)
BUN: 15 mg/dL (ref 8–23)
CO2: 25 mmol/L (ref 22–32)
Calcium: 10.4 mg/dL — ABNORMAL HIGH (ref 8.9–10.3)
Chloride: 105 mmol/L (ref 98–111)
Creatinine: 0.85 mg/dL (ref 0.44–1.00)
GFR, Estimated: >60 mL/min (ref >60)
Glucose, Bld: 118 mg/dL — ABNORMAL HIGH (ref 70–99)
Potassium: 4.1 mmol/L (ref 3.5–5.1)
Sodium: 136 mmol/L (ref 135–145)
Total Bilirubin: 0.5 mg/dL (ref 0.3–1.2)
Total Protein: 7.1 g/dL (ref 6.5–8.1)

## 2020-06-28 LAB — CBC WITH DIFFERENTIAL (CANCER CENTER ONLY)
Abs Immature Granulocytes: 0.02 10*3/uL (ref 0.00–0.07)
Basophils Absolute: 0 10*3/uL (ref 0.0–0.1)
Basophils Relative: 1 %
Eosinophils Absolute: 0.3 10*3/uL (ref 0.0–0.5)
Eosinophils Relative: 4 %
HCT: 44 % (ref 36.0–46.0)
Hemoglobin: 14.2 g/dL (ref 12.0–15.0)
Immature Granulocytes: 0 %
Lymphocytes Relative: 12 %
Lymphs Abs: 0.9 10*3/uL (ref 0.7–4.0)
MCH: 29 pg (ref 26.0–34.0)
MCHC: 32.3 g/dL (ref 30.0–36.0)
MCV: 90 fL (ref 80.0–100.0)
Monocytes Absolute: 0.6 10*3/uL (ref 0.1–1.0)
Monocytes Relative: 9 %
Neutro Abs: 5.6 10*3/uL (ref 1.7–7.7)
Neutrophils Relative %: 74 %
Platelet Count: 263 10*3/uL (ref 150–400)
RBC: 4.89 MIL/uL (ref 3.87–5.11)
RDW: 16.3 % — ABNORMAL HIGH (ref 11.5–15.5)
WBC Count: 7.6 10*3/uL (ref 4.0–10.5)
nRBC: 0 % (ref 0.0–0.2)

## 2020-06-28 NOTE — Telephone Encounter (Signed)
If she cant arrange transport. Just arrange for her to come to the [facility] of 07/04/20 for removal  We will get a CXR before we pull it  Or can the SNF get a cxr there and send me a report?  Just need to make sure the tube is not clogged and there isnt a re-accumulated effusion   I am on nights and will see if one of the APPs can help [place] figure this out tomorrow. (thanks in advance)   Thanks  Garner Nash, [facility] 06/28/2020 8:02 PM

## 2020-06-28 NOTE — Progress Notes (Signed)
[place] [phone]   [phone]  OFFICE PROGRESS NOTE  Cari Caraway, MD [place] [address]  DIAGNOSIS: Stage IV (T2b, N2, M1c), non-small cell lung cancer, adenocarcinoma.  The patient presented with a left upper lobe mass, right supra clavicular, left hilar and subcarinal lymphadenopathy as well as diffuse left-sided pleural disease with associated malignant left pleural effusion and 2 small lymph nodes near the SMA in addition to multiple brain metastasis diagnosed in December 2021.   Molecular studies by [facility] showed positive EGFR mutation with deletion in exon 19  PRIOR THERAPY: None  CURRENT THERAPY: Tagrisso 80 mg p.o. daily. First dose June 10, 2020..  INTERVAL HISTORY: Shelley Flores 87 y.o. female returns to the clinic today for follow-up visit.  The patient is feeling fine today with no concerning complaints except for development of mild skin rash on the legs.  She had 1 or 2 episodes of diarrhea but this was related to spicy meal that she ate.  She denied having any current chest pain, shortness of breath, cough or hemoptysis.  She has no more drainage from the Pleurx catheter and she would like it to be removed.  She denied having any current nausea, vomiting, diarrhea or constipation.  She has no significant weight loss or night sweats.  She started her treatment with Tagrisso on June 10, 2020 and she is tolerating it fairly well.  The patient is here today for evaluation and repeat blood work.  MEDICAL HISTORY: Past Medical History:  Diagnosis Date  . Bruit    Abdominal bruit - Abdominal aorta/renal duplex Doppler evaluation 12/07/03 -Mildly abnormal evaluation. 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 This is consistant w/median arcuate ligament compression syndrome. *Right & Left Kidney: Essentially equal in size, symmetrical in shape w/no significant abnormalities visualized.  *Right & Left Renal Arteries: No significant abnormalities.  . Edema extremities    LE edema   . H/O myocardial perfusion scan 02/20/00   To rule out ischemia - Negative adequate Bruce protocol exercise stress test with a deconditioned exercise response and normal static myocardial perfusion images with EF calculated by QGS of 77%. Represents a low risk study.  . Hyperlipidemia   . Hypertension   . Osteoarthritis   . Osteoporosis   . Pleural effusion 04/2020  . Valvular heart disease    Mild valvular heart disease by Echo in 2010, all mild and symptomatic, including concentric LVH, MR, TR, and AI with pulmonary artery pressure of 32. EF was normal.    ALLERGIES:  is allergic to irbesartan.  MEDICATIONS:  Current Outpatient Medications  Medication Sig Dispense Refill  . acetaminophen (TYLENOL) 325 MG tablet Take 2 tablets (650 mg total) by mouth every 6 (six) hours as needed for mild pain (or Fever >/= 101). 20 tablet 0  . amLODipine (NORVASC) 2.5 MG tablet Take 2.5 mg by mouth daily.    [name] aspirin 81 MG tablet Take 81 mg by mouth daily.    [name] atorvastatin (LIPITOR) 20 MG tablet TAKE 1 TABLET BY MOUTH EVERY DAY 90 tablet 1  . furosemide (LASIX) 20 MG tablet Take 1 tablet (20 mg total) by mouth as needed for fluid or edema. 30 tablet 6  . metoprolol succinate (TOPROL-XL) 50 MG 24 hr tablet Take 50 mg by mouth daily. Take with or immediately following a meal.    . Multiple Vitamin (MULTIVITAMIN) tablet Take 1 tablet by mouth daily.    [name]  osimertinib mesylate (TAGRISSO) 80 MG tablet Take 1 tablet (80 mg total) by mouth daily. Days 1-28. 30 tablet 3  . spironolactone (ALDACTONE) 25 MG tablet Take 1 tablet (25 mg total) by mouth daily. 90 tablet 3  . zinc oxide 20 % ointment Apply 1 application topically as needed for irritation. apply to buttocks/peri, topical, As Needed, To buttocks after every incontinent episode and as needed for redness. May keep at bedside.     No current  facility-administered medications for this visit.    SURGICAL HISTORY:  Past Surgical History:  Procedure Laterality Date  . Abdominal aorta/Renal duplex Doppler Evaluation  12/07/03   For abdominal bruit. Mildly abnormal evaluation. (See bruit in medical history)  . BREAST EXCISIONAL BIOPSY Left 1966  . BREAST EXCISIONAL BIOPSY Left 1999  . CATARACT EXTRACTION  2003  . COLONOSCOPY  10/22/2004  . DEXA Bone Scan  06/23/2013  . IR PERC PLEURAL DRAIN W/INDWELL CATH W/IMG GUIDE  05/10/2020  . IR THORACENTESIS ASP PLEURAL SPACE W/IMG GUIDE  05/04/2020    REVIEW OF SYSTEMS:  Constitutional: negative Eyes: negative Ears, nose, mouth, throat, and face: negative Respiratory: negative Cardiovascular: negative Gastrointestinal: negative Genitourinary:negative Integument/breast: negative Hematologic/lymphatic: negative Musculoskeletal:positive for muscle weakness Neurological: negative Behavioral/Psych: negative Endocrine: negative Allergic/Immunologic: negative   PHYSICAL EXAMINATION: General appearance: alert, cooperative and no distress Head: Normocephalic, without obvious abnormality, atraumatic Neck: no adenopathy, no JVD, supple, symmetrical, trachea midline and thyroid not enlarged, symmetric, no tenderness/mass/nodules Lymph nodes: Cervical, supraclavicular, and axillary nodes normal. Resp: clear to auscultation bilaterally Back: symmetric, no curvature. ROM normal. No CVA tenderness. Cardio: regular rate and rhythm, S1, S2 normal, no murmur, click, rub or gallop GI: soft, non-tender; bowel sounds normal; no masses,  no organomegaly Extremities: edema 1+ edema bilaterally Neurologic: Alert and oriented X 3, normal strength and tone. Normal symmetric reflexes. Normal coordination and gait  ECOG PERFORMANCE STATUS: 1 - Symptomatic but completely ambulatory  Blood pressure (!) 141/65, pulse 85, temperature 98 F (36.7 C), temperature source Tympanic, resp. rate 15, height 5' 4"  (1.626 m), weight 124 lb 8 oz (56.5 kg), SpO2 99 %.  LABORATORY DATA: Lab Results  Component Value Date   WBC 7.6 06/28/2020   HGB 14.2 06/28/2020   HCT 44.0 06/28/2020   MCV 90.0 06/28/2020   PLT 263 06/28/2020      Chemistry      Component Value Date/Time   NA 136 06/28/2020 1030   NA 137 05/25/2020 0000   K 4.1 06/28/2020 1030   CL 105 06/28/2020 1030   CO2 25 06/28/2020 1030   BUN 15 06/28/2020 1030   BUN 12 05/25/2020 0000   CREATININE 0.85 06/28/2020 1030   GLU 83 05/25/2020 0000      Component Value Date/Time   CALCIUM 10.4 (H) 06/28/2020 1030   ALKPHOS 64 06/28/2020 1030   AST 19 06/28/2020 1030   ALT 14 06/28/2020 1030   BILITOT 0.5 06/28/2020 1030       RADIOGRAPHIC STUDIES: MR Brain W Wo Contrast  Result Date: 06/07/2020 CLINICAL DATA:  Non-small cell lung cancer, staging. EXAM: MRI HEAD WITHOUT AND WITH CONTRAST TECHNIQUE: Multiplanar, multiecho pulse sequences of the brain and surrounding structures were obtained without and with intravenous contrast. CONTRAST:  38m GADAVIST GADOBUTROL 1 MMOL/ML IV SOLN COMPARISON:  None. FINDINGS: Brain: No acute infarction, hemorrhage, hydrocephalus or extra-axial collection. Multiple enhancing lesions consistent with metastatic disease, labeled on series 16, as follows: *Right cerebellar hemisphere, 2 mm, image 40. *Right posterior temporal  operculum, 10 mm, image 88. *Left parietal lobe, 5 mm, image 102. *Left parietal lobe, 5 mm, image 111. A 1.5 x 1.3 cm dural-based extra-axial enhancing lesion in the right frontal parietal convexity. No mass effect on the adjacent brain parenchyma. Remote lacunar infarcts in the bilateral cerebellar hemispheres and right caudate head. Scattered foci of T2 hyperintensity are seen within the white matter of the cerebral hemispheres, nonspecific, most likely related to chronic microangiopathic changes. Moderate parenchymal volume loss. Vascular: Normal flow voids. Skull and upper cervical spine:  Normal marrow signal. Sinuses/Orbits: Bilateral lens surgery. Paranasal sinuses are essentially clear. IMPRESSION: 1. Multiple enhancing lesions consistent with metastatic disease. 2. A 1.5 x 1.3 cm dural-based extra-axial enhancing lesion in the right frontal parietal convexity, likely a meningioma. However, given presence of metastatic disease to the brain, the possibility of secondary lesion cannot be excluded. 3. Moderate parenchymal volume loss and chronic microangiopathic changes. Remote lacunar infarcts in the bilateral cerebellar hemispheres and right caudate head. Electronically Signed   By: Pedro Earls M.D.   On: 06/07/2020 13:17   NM PET Image Initial (PI) Skull Base To Thigh  Result Date: 05/31/2020 CLINICAL DATA:  Initial treatment strategy for adenocarcinoma left upper lobe with malignant pleural effusion. EXAM: NUCLEAR MEDICINE PET SKULL BASE TO THIGH TECHNIQUE: 6.2 mCi F-18 FDG was injected intravenously. Full-ring PET imaging was performed from the skull base to thigh after the radiotracer. CT data was obtained and used for attenuation correction and anatomic localization. Fasting blood glucose: 96 mg/dl COMPARISON:  Chest CT 05/02/2020 FINDINGS: Mediastinal blood pool activity: SUV max 2.53 Liver activity: SUV max NA NECK: No hypermetabolic lymph nodes in the neck. Incidental CT findings: none CHEST: The medial left upper lobe lung mass is hypermetabolic with SUV max of 8.37. 9 mm left hilar node has an SUV max of 5.69. 13 mm subcarinal lymph node has an SUV max of 2.90 Hypermetabolic supraclavicular nodes on the right side. The largest node measures 7.5 mm and has an SUV max of 4.64. Fairly extensive pleural disease with SUV max ranging from 3.0-4.0. No hypermetabolic metastatic pulmonary nodules are identified. Incidental CT findings: Left-sided PleurX drainage catheter in place. Persistent small right pleural effusion. ABDOMEN/PELVIS: 2 small adjacent celiac axis lymph  nodes are hypermetabolic with SUV max of 2.11. No other abdominal/pelvic metastatic disease is identified. No hepatic or adrenal gland lesions. Incidental CT findings: Stable large bilateral renal cysts. SKELETON: No focal hypermetabolic activity to suggest skeletal metastasis. Incidental CT findings: none IMPRESSION: 1. 4.5 cm medial left upper lobe mass is hypermetabolic and consistent with primary lung neoplasm. Associated hypermetabolic right supraclavicular, left hilar and subcarinal adenopathy. 2. Diffuse left-sided hypermetabolic pleural disease and associated malignant left pleural effusion. 3. Two small lymph nodes near the SMA are hypermetabolic and consistent with metastatic adenopathy. No other abdominal/pelvic metastatic disease is identified. 4. No findings for osseous metastatic disease. Electronically Signed   By: Marijo Sanes M.D.   On: 05/31/2020 10:05    ASSESSMENT AND PLAN: This is a very pleasant 87 years old white female recently diagnosed with a stage IV (T3, N3, M1 C) non-small cell lung cancer, adenocarcinoma with positive EGFR mutation with deletion in exon 19 diagnosed in December 2021 and presented with left lower lobe lung mass in addition to right supraclavicular, left hilar and subcarinal lymphadenopathy as well as metastatic lymphadenopathy in the abdomen and left pleural-based metastasis as well as malignant left pleural effusion and multiple brain metastasis. The molecular study showed  positive EGFR mutation with deletion in exon 19. The patient started treatment with Tagrisso on June 10, 2020 and has been tolerating it fairly well with no concerning adverse effects. I recommended for her to continue her current treatment with Tagrisso with the same dose. I will see her back for follow-up visit in 2 weeks for evaluation and repeat blood work. For the left pleural effusion, I will arrange for the patient to have an appointment with Dr. Valeta Harms to remove the Pleurx catheter  since she has no more drainage for the last few weeks. For the brain metastasis I will monitor this subcentimeter lesions on the treatment with Tagrisso which across the blood-brain barrier and has been very effective in the management of brain metastasis and the patient is currently asymptomatic. The patient was advised to call immediately if she has any other concerning symptoms in the interval. The patient voices understanding of current disease status and treatment options and is in agreement with the current care plan.  All questions were answered. The patient knows to call the clinic with any problems, questions or concerns. We can certainly see the patient much sooner if necessary. The total time spent in the appointment was 40 minutes.  Disclaimer: This note was dictated with voice recognition software. Similar sounding words can inadvertently be transcribed and may not be corrected upon review.

## 2020-06-28 NOTE — H&P (View-Only) (Signed)
    [facility] [phone]   [phone]  OFFICE PROGRESS NOTE  McNeill, Wendy, MD [address] [place] [address]  DIAGNOSIS: Stage IV (T2b, N2, M1c), non-small cell lung cancer, adenocarcinoma.  The patient presented with a left upper lobe mass, right supra clavicular, left hilar and subcarinal lymphadenopathy as well as diffuse left-sided pleural disease with associated malignant left pleural effusion and 2 small lymph nodes near the SMA in addition to multiple brain metastasis diagnosed in December 2021.   Molecular studies by [facility] [employer] showed positive EGFR mutation with deletion in exon 19  PRIOR THERAPY: None  CURRENT THERAPY: Tagrisso 80 mg p.o. daily. First dose June 10, 2020..  INTERVAL HISTORY: Shelley Flores 87 y.o. female returns to the clinic today for follow-up visit.  The patient is feeling fine today with no concerning complaints except for development of mild skin rash on the legs.  She had 1 or 2 episodes of diarrhea but this was related to spicy meal that she ate.  She denied having any current chest pain, shortness of breath, cough or hemoptysis.  She has no more drainage from the Pleurx catheter and she would like it to be removed.  She denied having any current nausea, vomiting, diarrhea or constipation.  She has no significant weight loss or night sweats.  She started her treatment with Tagrisso on June 10, 2020 and she is tolerating it fairly well.  The patient is here today for evaluation and repeat blood work.  MEDICAL HISTORY: Past Medical History:  Diagnosis Date  . Bruit    Abdominal bruit - Abdominal aorta/renal duplex Doppler evaluation 12/07/03 -Mildly abnormal evaluation. 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 This is consistant w/median arcuate ligament compression syndrome. *Right & Left Kidney: Essentially equal in size, symmetrical in shape w/no significant abnormalities visualized.  *Right & Left Renal Arteries: No significant abnormalities.  . Edema extremities    LE edema   . H/O myocardial perfusion scan 02/20/00   To rule out ischemia - Negative adequate Bruce protocol exercise stress test with a deconditioned exercise response and normal static myocardial perfusion images with EF calculated by QGS of 77%. Represents a low risk study.  . Hyperlipidemia   . Hypertension   . Osteoarthritis   . Osteoporosis   . Pleural effusion 04/2020  . Valvular heart disease    Mild valvular heart disease by Echo in 2010, all mild and symptomatic, including concentric LVH, MR, TR, and AI with pulmonary artery pressure of 32. EF was normal.    ALLERGIES:  is allergic to irbesartan.  MEDICATIONS:  Current Outpatient Medications  Medication Sig Dispense Refill  . acetaminophen (TYLENOL) 325 MG tablet Take 2 tablets (650 mg total) by mouth every 6 (six) hours as needed for mild pain (or Fever >/= 101). 20 tablet 0  . amLODipine (NORVASC) 2.5 MG tablet Take 2.5 mg by mouth daily.    . aspirin 81 MG tablet Take 81 mg by mouth daily.    . atorvastatin (LIPITOR) 20 MG tablet TAKE 1 TABLET BY MOUTH EVERY DAY 90 tablet 1  . furosemide (LASIX) 20 MG tablet Take 1 tablet (20 mg total) by mouth as needed for fluid or edema. 30 tablet 6  . metoprolol succinate (TOPROL-XL) 50 MG 24 hr tablet Take 50 mg by mouth daily. Take with or immediately following a meal.    . Multiple Vitamin (MULTIVITAMIN) tablet Take 1 tablet by mouth daily.    .   osimertinib mesylate (TAGRISSO) 80 MG tablet Take 1 tablet (80 mg total) by mouth daily. Days 1-28. 30 tablet 3  . spironolactone (ALDACTONE) 25 MG tablet Take 1 tablet (25 mg total) by mouth daily. 90 tablet 3  . zinc oxide 20 % ointment Apply 1 application topically as needed for irritation. apply to buttocks/peri, topical, As Needed, To buttocks after every incontinent episode and as needed for redness. May keep at bedside.     No current  facility-administered medications for this visit.    SURGICAL HISTORY:  Past Surgical History:  Procedure Laterality Date  . Abdominal aorta/Renal duplex Doppler Evaluation  12/07/03   For abdominal bruit. Mildly abnormal evaluation. (See bruit in medical history)  . BREAST EXCISIONAL BIOPSY Left 1966  . BREAST EXCISIONAL BIOPSY Left 1999  . CATARACT EXTRACTION  2003  . COLONOSCOPY  10/22/2004  . DEXA Bone Scan  06/23/2013  . IR PERC PLEURAL DRAIN W/INDWELL CATH W/IMG GUIDE  05/10/2020  . IR THORACENTESIS ASP PLEURAL SPACE W/IMG GUIDE  05/04/2020    REVIEW OF SYSTEMS:  Constitutional: negative Eyes: negative Ears, nose, mouth, throat, and face: negative Respiratory: negative Cardiovascular: negative Gastrointestinal: negative Genitourinary:negative Integument/breast: negative Hematologic/lymphatic: negative Musculoskeletal:positive for muscle weakness Neurological: negative Behavioral/Psych: negative Endocrine: negative Allergic/Immunologic: negative   PHYSICAL EXAMINATION: General appearance: alert, cooperative and no distress Head: Normocephalic, without obvious abnormality, atraumatic Neck: no adenopathy, no JVD, supple, symmetrical, trachea midline and thyroid not enlarged, symmetric, no tenderness/mass/nodules Lymph nodes: Cervical, supraclavicular, and axillary nodes normal. Resp: clear to auscultation bilaterally Back: symmetric, no curvature. ROM normal. No CVA tenderness. Cardio: regular rate and rhythm, S1, S2 normal, no murmur, click, rub or gallop GI: soft, non-tender; bowel sounds normal; no masses,  no organomegaly Extremities: edema 1+ edema bilaterally Neurologic: Alert and oriented X 3, normal strength and tone. Normal symmetric reflexes. Normal coordination and gait  ECOG PERFORMANCE STATUS: 1 - Symptomatic but completely ambulatory  Blood pressure (!) 141/65, pulse 85, temperature 98 F (36.7 C), temperature source Tympanic, resp. rate 15, height 5' 4"  (1.626 m), weight 124 lb 8 oz (56.5 kg), SpO2 99 %.  LABORATORY DATA: Lab Results  Component Value Date   WBC 7.6 06/28/2020   HGB 14.2 06/28/2020   HCT 44.0 06/28/2020   MCV 90.0 06/28/2020   PLT 263 06/28/2020      Chemistry      Component Value Date/Time   NA 136 06/28/2020 1030   NA 137 05/25/2020 0000   K 4.1 06/28/2020 1030   CL 105 06/28/2020 1030   CO2 25 06/28/2020 1030   BUN 15 06/28/2020 1030   BUN 12 05/25/2020 0000   CREATININE 0.85 06/28/2020 1030   GLU 83 05/25/2020 0000      Component Value Date/Time   CALCIUM 10.4 (H) 06/28/2020 1030   ALKPHOS 64 06/28/2020 1030   AST 19 06/28/2020 1030   ALT 14 06/28/2020 1030   BILITOT 0.5 06/28/2020 1030       RADIOGRAPHIC STUDIES: MR Brain W Wo Contrast  Result Date: 06/07/2020 CLINICAL DATA:  Non-small cell lung cancer, staging. EXAM: MRI HEAD WITHOUT AND WITH CONTRAST TECHNIQUE: Multiplanar, multiecho pulse sequences of the brain and surrounding structures were obtained without and with intravenous contrast. CONTRAST:  6mL GADAVIST GADOBUTROL 1 MMOL/ML IV SOLN COMPARISON:  None. FINDINGS: Brain: No acute infarction, hemorrhage, hydrocephalus or extra-axial collection. Multiple enhancing lesions consistent with metastatic disease, labeled on series 16, as follows: *Right cerebellar hemisphere, 2 mm, image 40. *Right posterior temporal   operculum, 10 mm, image 88. *Left parietal lobe, 5 mm, image 102. *Left parietal lobe, 5 mm, image 111. A 1.5 x 1.3 cm dural-based extra-axial enhancing lesion in the right frontal parietal convexity. No mass effect on the adjacent brain parenchyma. Remote lacunar infarcts in the bilateral cerebellar hemispheres and right caudate head. Scattered foci of T2 hyperintensity are seen within the white matter of the cerebral hemispheres, nonspecific, most likely related to chronic microangiopathic changes. Moderate parenchymal volume loss. Vascular: Normal flow voids. Skull and upper cervical spine:  Normal marrow signal. Sinuses/Orbits: Bilateral lens surgery. Paranasal sinuses are essentially clear. IMPRESSION: 1. Multiple enhancing lesions consistent with metastatic disease. 2. A 1.5 x 1.3 cm dural-based extra-axial enhancing lesion in the right frontal parietal convexity, likely a meningioma. However, given presence of metastatic disease to the brain, the possibility of secondary lesion cannot be excluded. 3. Moderate parenchymal volume loss and chronic microangiopathic changes. Remote lacunar infarcts in the bilateral cerebellar hemispheres and right caudate head. Electronically Signed   By: Katyucia  De Macedo Rodrigues M.D.   On: 06/07/2020 13:17   NM PET Image Initial (PI) Skull Base To Thigh  Result Date: 05/31/2020 CLINICAL DATA:  Initial treatment strategy for adenocarcinoma left upper lobe with malignant pleural effusion. EXAM: NUCLEAR MEDICINE PET SKULL BASE TO THIGH TECHNIQUE: 6.2 mCi F-18 FDG was injected intravenously. Full-ring PET imaging was performed from the skull base to thigh after the radiotracer. CT data was obtained and used for attenuation correction and anatomic localization. Fasting blood glucose: 96 mg/dl COMPARISON:  Chest CT 05/02/2020 FINDINGS: Mediastinal blood pool activity: SUV max 2.53 Liver activity: SUV max NA NECK: No hypermetabolic lymph nodes in the neck. Incidental CT findings: none CHEST: The medial left upper lobe lung mass is hypermetabolic with SUV max of 9.01. 9 mm left hilar node has an SUV max of 5.69. 13 mm subcarinal lymph node has an SUV max of 7.60 Hypermetabolic supraclavicular nodes on the right side. The largest node measures 7.5 mm and has an SUV max of 4.64. Fairly extensive pleural disease with SUV max ranging from 3.0-4.0. No hypermetabolic metastatic pulmonary nodules are identified. Incidental CT findings: Left-sided PleurX drainage catheter in place. Persistent small right pleural effusion. ABDOMEN/PELVIS: 2 small adjacent celiac axis lymph  nodes are hypermetabolic with SUV max of 3.79. No other abdominal/pelvic metastatic disease is identified. No hepatic or adrenal gland lesions. Incidental CT findings: Stable large bilateral renal cysts. SKELETON: No focal hypermetabolic activity to suggest skeletal metastasis. Incidental CT findings: none IMPRESSION: 1. 4.5 cm medial left upper lobe mass is hypermetabolic and consistent with primary lung neoplasm. Associated hypermetabolic right supraclavicular, left hilar and subcarinal adenopathy. 2. Diffuse left-sided hypermetabolic pleural disease and associated malignant left pleural effusion. 3. Two small lymph nodes near the SMA are hypermetabolic and consistent with metastatic adenopathy. No other abdominal/pelvic metastatic disease is identified. 4. No findings for osseous metastatic disease. Electronically Signed   By: P.  Gallerani M.D.   On: 05/31/2020 10:05    ASSESSMENT AND PLAN: This is a very pleasant 87 years old white female recently diagnosed with a stage IV (T3, N3, M1 C) non-small cell lung cancer, adenocarcinoma with positive EGFR mutation with deletion in exon 19 diagnosed in December 2021 and presented with left lower lobe lung mass in addition to right supraclavicular, left hilar and subcarinal lymphadenopathy as well as metastatic lymphadenopathy in the abdomen and left pleural-based metastasis as well as malignant left pleural effusion and multiple brain metastasis. The molecular study showed   positive EGFR mutation with deletion in exon 19. The patient started treatment with Tagrisso on June 10, 2020 and has been tolerating it fairly well with no concerning adverse effects. I recommended for her to continue her current treatment with Tagrisso with the same dose. I will see her back for follow-up visit in 2 weeks for evaluation and repeat blood work. For the left pleural effusion, I will arrange for the patient to have an appointment with Dr. Icard to remove the Pleurx catheter  since she has no more drainage for the last few weeks. For the brain metastasis I will monitor this subcentimeter lesions on the treatment with Tagrisso which across the blood-brain barrier and has been very effective in the management of brain metastasis and the patient is currently asymptomatic. The patient was advised to call immediately if she has any other concerning symptoms in the interval. The patient voices understanding of current disease status and treatment options and is in agreement with the current care plan.  All questions were answered. The patient knows to call the clinic with any problems, questions or concerns. We can certainly see the patient much sooner if necessary. The total time spent in the appointment was 40 minutes.  Disclaimer: This note was dictated with voice recognition software. Similar sounding words can inadvertently be transcribed and may not be corrected upon review.       

## 2020-06-28 NOTE — Telephone Encounter (Signed)
I have placed the order for the cxr to be done this week.  BI would like to see the cxr prior to scheduling the removal of the IPC.    I have called and spoke with the pt and she stated that she will try to set up transportation for this.  She stated that she is currently in the skilled care facility and she stated that it takes days to set up transportation for this.  She is going to check and will call me back.

## 2020-06-29 ENCOUNTER — Telehealth: Payer: Self-pay | Admitting: Internal Medicine

## 2020-06-29 NOTE — Telephone Encounter (Signed)
Scheduled appts per 2/16 los. Pt confirmed appt date and time.

## 2020-06-29 NOTE — Telephone Encounter (Signed)
Tammy,   Can you help me follow up on this cxr tomorrow?  Looking to see if there is re-acculumation of the pleural effusion. If there is fluid that likely means the pleurex is clogged and not functioning If the pleural effusion is gone it likely means there has been pleurodesis  Either why she will need to come to the hospital Tuesday next week [facility] Endo for [place] to remove or attempt to de-clogg.   Thanks,  BLI   Garner Nash, DO [facility] 06/29/2020 8:34 PM

## 2020-06-29 NOTE — Telephone Encounter (Signed)
Called and spoke with patient in regards to transportation to get CXR done. She states that she was able to get it arranged with the SNF that she is at. She stated they are going to bring her to the office tomorrow around 11.  Will route to Dr. Valeta Harms as Juluis Rainier.

## 2020-06-30 ENCOUNTER — Ambulatory Visit (INDEPENDENT_AMBULATORY_CARE_PROVIDER_SITE_OTHER): Payer: Medicare PPO

## 2020-06-30 ENCOUNTER — Encounter: Payer: Self-pay | Admitting: *Deleted

## 2020-06-30 DIAGNOSIS — J90 Pleural effusion, not elsewhere classified: Secondary | ICD-10-CM

## 2020-06-30 NOTE — Telephone Encounter (Signed)
Dr. Valeta Harms wants this patient scheduled for [facility] ENDO next Tuesday to remove her Pleurx.

## 2020-06-30 NOTE — Progress Notes (Signed)
  Oncology Nurse Navigator Documentation  Oncology Nurse Navigator Flowsheets 06/30/2020  Abnormal Finding Date 04/28/2020  Confirmed Diagnosis Date 05/04/2020  Diagnosis Status Confirmed Diagnosis Complete  Planned Course of Treatment Targeted Therapy  Phase of Treatment Targeted Therapy  Targeted Therapy Actual Start Date: 06/10/2020  Navigator Follow Up Date: 07/12/2020  Navigator Follow Up Reason: Follow-up Appointment  Navigator Location [facility]  Navigator Encounter Type Other:  Patient Visit Type Other  Treatment Phase Treatment/Patient is currently on oral biologic started on 1/29.  Patient has follow up with Dr. Julien Nordmann in 2 weeks.   Barriers/Navigation Needs Coordination of Care  Interventions Coordination of Care  Acuity Level 2-Minimal Needs (1-2 Barriers Identified)  Coordination of Care Other  Time Spent with Patient 30

## 2020-07-03 ENCOUNTER — Telehealth: Payer: Self-pay | Admitting: Family Medicine

## 2020-07-03 ENCOUNTER — Other Ambulatory Visit (HOSPITAL_COMMUNITY)
Admission: RE | Admit: 2020-07-03 | Discharge: 2020-07-03 | Disposition: A | Discharge status: Home / Self Care | Payer: Medicare PPO | Source: Ambulatory Visit | Attending: Pulmonary Disease | Admitting: Pulmonary Disease

## 2020-07-03 DIAGNOSIS — Z20822 Contact with and (suspected) exposure to covid-19: Secondary | ICD-10-CM | POA: Insufficient documentation

## 2020-07-03 DIAGNOSIS — Z01812 Encounter for preprocedural laboratory examination: Secondary | ICD-10-CM | POA: Insufficient documentation

## 2020-07-03 MED FILL — TAGRISSO 80 MG TABLET: 80 | 30 days supply | Qty: 30 | Fill #1

## 2020-07-03 NOTE — Telephone Encounter (Signed)
It does not appear that this has been arranged yet.   Routing to procedure pool as urgent.

## 2020-07-03 NOTE — Telephone Encounter (Signed)
Patient called back [facility] is not able to transport her for testing today.   However, transportation services did get a vendor to take patient to the drive through testing site today. I let the transportation services know patient has to be at  Hospital by 130 on 07/04/20 for the procedure and return home after. Procedure will be done around 2pm per [facility] services will pick up patient today in 30 minutes for testing. They will also have her to her appointment in ENDO tomorrow and take her home.   Nothing further needed at this time.

## 2020-07-03 NOTE — Telephone Encounter (Signed)
Shelley Flores, I dont see her on the schedule for tomorrow? I will touch base with you in the AM. Looks like she is coming from [facility]. I am there all day and will remove IPC if she is there.   Thanks for helping coordinate.   Thanks  Garner Nash, DO [facility] 07/03/2020 6:20 PM

## 2020-07-03 NOTE — Telephone Encounter (Signed)
Patient was never told about results of her chest xray she didn't know about having her pleurx removed.   She needs to set up transportation and hasn't been covid tested.  Transportation [phone] will call back to let us know if patient can get set up for testing today.  They will call patient and set up pick up and transportation with patient for tomorrow 07/04/20 in ENDO   Called spoke with patient. Let her know to ask her facility if they can transport her to the testing site today before 3pm

## 2020-07-03 NOTE — Telephone Encounter (Signed)
Did this get set up ?

## 2020-07-03 NOTE — Telephone Encounter (Signed)
   Shelley Flores DOB: 05-Jun-1932 MRN: [id number]   RIDER WAIVER AND RELEASE OF LIABILITY  For purposes of improving physical access to our facilities, [facility] is pleased to partner with third parties to provide [facility] [place] patients or other authorized individuals the option of convenient, on-demand ground transportation services (the [place]") through use of the technology service that enables users to request on-demand ground transportation from independent third-party providers.  By opting to use and accept these [employer], I, the undersigned, hereby agree on behalf of myself, and on behalf of any minor child using the [employer] for whom I am the parent or legal guardian, as follows:  1. Government social research officer provided to me are provided by independent third-party transportation providers who are not [employer] or employees and who are unaffiliated with [employer]. 2. [place] [facility] is neither a transportation carrier nor a common or public carrier. 3. [facility] has no control over the quality or safety of the transportation that occurs as a result of the [employer]. 4. [facility] cannot guarantee that any third-party transportation provider will complete any arranged transportation service. 5. [facility] makes no representation, warranty, or guarantee regarding the reliability, timeliness, quality, safety, suitability, or availability of any of the Transport Services or that they will be error free. 6. I fully understand that traveling by vehicle involves risks and dangers of serious bodily injury, including permanent disability, paralysis, and death. I agree, on behalf of myself and on behalf of any minor child using the Transport Services for whom I am the parent or legal guardian, that the entire risk arising out of my use of the [employer] remains solely with me, to the maximum extent permitted under applicable law. 7. The [employer] are provided "as is" and "as available." [facility] disclaims all representations and warranties, express, implied or statutory, not expressly set out in these terms, including the implied warranties of merchantability and fitness for a particular purpose. 8. I hereby waive and release [facility], its agents, employees, officers, directors, representatives, insurers, attorneys, assigns, successors, subsidiaries, and affiliates from any and all past, present, or future claims, demands, liabilities, actions, causes of action, or suits of any kind directly or indirectly arising from acceptance and use of the [employer]. 9. I further waive and release  and its affiliates from all present and future liability and responsibility for any injury or death to persons or damages to property caused by or related to the use of the [employer]. 10. I have read this Waiver and Release of Liability, and I understand the terms used in it and their legal significance. This Waiver is freely and voluntarily given with the understanding that my right (as well as the right of any minor child for whom I am the parent or legal guardian using the [employer]) to legal recourse against  in connection with the [employer] is knowingly surrendered in return for use of these services.   I attest that I read the consent document to Shelley Flores, gave Ms. Stokl[place] the opportunity to ask questions and answered the questions asked (if any). I affirm that Shelley Flores then provided consent for she's participation in this program.     Legrand Pitts

## 2020-07-04 ENCOUNTER — Encounter (HOSPITAL_COMMUNITY): Admission: RE | Disposition: A | Discharge status: Home / Self Care | Payer: Self-pay | Attending: Pulmonary Disease

## 2020-07-04 ENCOUNTER — Ambulatory Visit (HOSPITAL_COMMUNITY)
Admission: RE | Admit: 2020-07-04 | Discharge: 2020-07-04 | Disposition: A | Discharge status: Home / Self Care | Payer: Medicare PPO | Source: Other Acute Inpatient Hospital | Attending: Pulmonary Disease | Admitting: Pulmonary Disease

## 2020-07-04 DIAGNOSIS — C782 Secondary malignant neoplasm of pleura: Secondary | ICD-10-CM | POA: Diagnosis not present

## 2020-07-04 DIAGNOSIS — C3412 Malignant neoplasm of upper lobe, left bronchus or lung: Secondary | ICD-10-CM | POA: Insufficient documentation

## 2020-07-04 DIAGNOSIS — Z888 Allergy status to other drugs, medicaments and biological substances status: Secondary | ICD-10-CM | POA: Diagnosis not present

## 2020-07-04 DIAGNOSIS — C7931 Secondary malignant neoplasm of brain: Secondary | ICD-10-CM | POA: Diagnosis not present

## 2020-07-04 DIAGNOSIS — J90 Pleural effusion, not elsewhere classified: Secondary | ICD-10-CM

## 2020-07-04 DIAGNOSIS — Z9689 Presence of other specified functional implants: Secondary | ICD-10-CM

## 2020-07-04 DIAGNOSIS — Z79899 Other long term (current) drug therapy: Secondary | ICD-10-CM | POA: Diagnosis not present

## 2020-07-04 DIAGNOSIS — Z4689 Encounter for fitting and adjustment of other specified devices: Secondary | ICD-10-CM | POA: Diagnosis not present

## 2020-07-04 DIAGNOSIS — J910 Malignant pleural effusion: Secondary | ICD-10-CM | POA: Insufficient documentation

## 2020-07-04 DIAGNOSIS — Z7982 Long term (current) use of aspirin: Secondary | ICD-10-CM | POA: Diagnosis not present

## 2020-07-04 HISTORY — PX: CHEST TUBE INSERTION: SHX231

## 2020-07-04 LAB — SARS CORONAVIRUS 2 (TAT 6-24 HRS): SARS Coronavirus 2: NEGATIVE

## 2020-07-04 SURGERY — INSERTION, PLEURAL DRAINAGE CATHETER
Anesthesia: LOCAL

## 2020-07-04 NOTE — Interval H&P Note (Signed)
History and Physical Interval Note:  07/04/2020 1:32 PM  Shelley Flores  has presented today for surgery, with the diagnosis of pleural effusion.  The various methods of treatment have been discussed with the patient and family. After consideration of risks, benefits and other options for treatment, the patient has consented to  Procedure(s): REMOVAL PLEURAL DRAINAGE CATHETER (N/A) as a surgical intervention.  The patient's history has been reviewed, patient examined, no change in status, stable for surgery.  I have reviewed the patient's chart and labs.  Questions were answered to the patient's satisfaction.    Patient seen at the request of Dr. Julien Nordmann for removal of pleurex catheter. Patient seen and examined in the pre-operative area of endoscopy. Patients CXR was reviewed. Small amount of loculated fluid remains. No barriers to proceed with IPC w/ cuff removal.   Leory Plowman L Radin Raptis

## 2020-07-04 NOTE — Op Note (Signed)
Indwelling pleural catheter with cuff removal Procedure Note  Shelley Flores  [id number]  Apr 23, 1933  Date:07/04/20  Time:2:54 PM   Provider Performing:Bradley L Icard  Procedure: Indwelling tunneled Pleural Catheter removal (32552)  Indication(s)  nonfunctioning indwelling pleural catheter  Consent Risks of the procedure as well as the alternatives and risks of each were explained to the patient and/or caregiver.  Consent for the procedure was obtained.  Anesthesia Topical only with 1% lidocaine   Time Out Verified patient identification, verified procedure, site/side was marked, verified correct patient position, special equipment/implants available, medications/allergies/relevant history reviewed, required imaging and test results available.  Sterile Technique  sterile technique was used for skin cleansing prior to topical anesthetic.  Sterile gloves were used for manipulation of the skin site and removal of the catheter.  Procedure Description  patient was brought back to the minor procedure room here at [facility] in endoscopy.  Chlorhexidine was used to cleanse the site around the current indwelling pleural catheter.  5 cc of 1% lidocaine was injected around the insertion site and up to the cuff placement around the indwelling pleural catheter. A pair of tweezers was used to loosen the tissue around the indwelling pleural catheter cuff.  Blunt retraction on the catheter was used for removal from the patient's chest.  Once the catheter loosened the remainder of the tube was removed and the insertion site inspected.  There was no evidence of pus or erythema.  A small amount of serous sanguinous fluid drained.  The site was cleansed again and a 4 x 4 folded gauze and large Tegaderm was placed to cover.  Complications/Tolerance None; patient tolerated the procedure well.   No chest x-ray was needed.  EBL Minimal <1 cc  Specimen(s) none  Shelley Nash, DO [facility] 07/04/2020 2:59 PM

## 2020-07-04 NOTE — Discharge Instructions (Signed)
Wound Care, Adult Taking care of your wound properly can help to prevent pain, infection, and scarring. It can also help your wound heal more quickly. Follow instructions from your health care provider about how to care for your wound. Supplies needed:  Soap and water.  Wound cleanser.  Gauze.  If needed, a clean bandage (dressing) or other type of wound dressing material to cover or place in the wound. Follow your health care provider's instructions about what dressing supplies to use.  Cream or ointment to apply to the wound, if told by your health care provider. How to care for your wound Cleaning the wound Ask your health care provider how to clean the wound. This may include:  Using mild soap and water or a wound cleanser.  Using a clean gauze to pat the wound dry after cleaning it. Do not rub or scrub the wound. Dressing care  Wash your hands with soap and water for at least 20 seconds before and after you change the dressing. If soap and water are not available, use hand sanitizer.  Change your dressing as told by your health care provider. This may include: ? Cleaning or rinsing out (irrigating) the wound. ? Placing a dressing over the wound or in the wound (packing). ? Covering the wound with an outer dressing.  Leave any stitches (sutures), skin glue, or adhesive strips in place. These skin closures may need to stay in place for 2 weeks or longer. If adhesive strip edges start to loosen and curl up, you may trim the loose edges. Do not remove adhesive strips completely unless your health care provider tells you to do that.  Ask your health care provider when you can leave the wound uncovered. Checking for infection Check your wound area every day for signs of infection. Check for:  More redness, swelling, or pain.  Fluid or blood.  Warmth.  Pus or a bad smell.   Follow these instructions at home Medicines  If you were prescribed an antibiotic medicine, cream, or  ointment, take or apply it as told by your health care provider. Do not stop using the antibiotic even if your condition improves.  If you were prescribed pain medicine, take it 30 minutes before you do any wound care or as told by your health care provider.  Take over-the-counter and prescription medicines only as told by your health care provider. Eating and drinking  Eat a diet that includes protein, vitamin A, vitamin C, and other nutrient-rich foods to help the wound heal. ? Foods rich in protein include meat, fish, eggs, dairy, beans, and nuts. ? Foods rich in vitamin A include carrots and dark green, leafy vegetables. ? Foods rich in vitamin C include citrus fruits, tomatoes, broccoli, and peppers.  Drink enough fluid to keep your urine pale yellow. General instructions  Do not take baths, swim, use a hot tub, or do anything that would put the wound underwater until your health care provider approves. Ask your health care provider if you may take showers. You may only be allowed to take sponge baths.  Do not scratch or pick at the wound. Keep it covered as told by your health care provider.  Return to your normal activities as told by your health care provider. Ask your health care provider what activities are safe for you.  Protect your wound from the sun when you are outside for the first 6 months, or for as long as told by your health care provider. Cover  up the scar area or apply sunscreen that has an SPF of at least 30.  Do not use any products that contain nicotine or tobacco, such as cigarettes, e-cigarettes, and chewing tobacco. These may delay wound healing. If you need help quitting, ask your health care provider.  Keep all follow-up visits as told by your health care provider. This is important. Contact a health care provider if:  You received a tetanus shot and you have swelling, severe pain, redness, or bleeding at the injection site.  Your pain is not controlled  with medicine.  You have any of these signs of infection: ? More redness, swelling, or pain around the wound. ? Fluid or blood coming from the wound. ? Warmth coming from the wound. ? Pus or a bad smell coming from the wound. ? A fever or chills.  You are nauseous or you vomit.  You are dizzy. Get help right away if:  You have a red streak of skin near the area around your wound.  Your wound has been closed with staples, sutures, skin glue, or adhesive strips and it begins to open up and separate.  Your wound is bleeding, and the bleeding does not stop with gentle pressure.  You have a rash.  You faint.  You have trouble breathing. These symptoms may represent a serious problem that is an emergency. Do not wait to see if the symptoms will go away. Get medical help right away. Call your local emergency services (911 in the [place] Do not drive yourself to the hospital. Summary  Always wash your hands with soap and water for at least 20 seconds before and after changing your dressing.  Change your dressing as told by your health care provider.  To help with healing, eat foods that are rich in protein, vitamin A, vitamin C, and other nutrients.  Check your wound every day for signs of infection. Contact your health care provider if you suspect that your wound is infected. This information is not intended to replace advice given to you by your health care provider. Make sure you discuss any questions you have with your health care provider. Document Revised: 02/12/2019 Document Reviewed: 02/12/2019 Elsevier Patient Education  2021 [employer].

## 2020-07-05 ENCOUNTER — Encounter (HOSPITAL_COMMUNITY): Payer: Self-pay | Admitting: Pulmonary Disease

## 2020-07-06 ENCOUNTER — Non-Acute Institutional Stay (SKILLED_NURSING_FACILITY): Payer: Medicare PPO | Admitting: Internal Medicine

## 2020-07-06 DIAGNOSIS — K219 Gastro-esophageal reflux disease without esophagitis: Secondary | ICD-10-CM | POA: Diagnosis not present

## 2020-07-06 DIAGNOSIS — I10 Essential (primary) hypertension: Secondary | ICD-10-CM | POA: Diagnosis not present

## 2020-07-06 DIAGNOSIS — J90 Pleural effusion, not elsewhere classified: Secondary | ICD-10-CM

## 2020-07-06 DIAGNOSIS — R600 Localized edema: Secondary | ICD-10-CM

## 2020-07-06 DIAGNOSIS — C3492 Malignant neoplasm of unspecified part of left bronchus or lung: Secondary | ICD-10-CM | POA: Diagnosis not present

## 2020-07-06 DIAGNOSIS — R6881 Early satiety: Secondary | ICD-10-CM | POA: Diagnosis not present

## 2020-07-06 DIAGNOSIS — E782 Mixed hyperlipidemia: Secondary | ICD-10-CM | POA: Diagnosis not present

## 2020-07-07 ENCOUNTER — Encounter: Payer: Self-pay | Admitting: Internal Medicine

## 2020-07-07 NOTE — Progress Notes (Signed)
Location:    [facility] Room Number: 4 Place of Service:  SNF [phone])  Provider: Veleta Miners MD  PCP: Cari Caraway, MD Patient Care Team: Cari Caraway, MD as PCP - General (Family Medicine) Valrie Hart, RN as Oncology Nurse Navigator (Oncology)  Extended Emergency Contact Information Primary Emergency Contact: swiftmick, pat Mobile Phone: [phone] Relation: Denman George Secondary Emergency Contact: Genice Rouge Address: [place], [place] [address] [place] Phone: [phone] Work Phone: [phone] Relation: Other  Code Status: DNR Goals of care:  Advanced Directive information Advanced Directives 05/17/2020  Does Patient Have a Medical Advance Directive? Yes  Type of Advance Directive [place]  Does patient want to make changes to medical advance directive? No - Patient declined  Copy of [place] in Chart? Yes - validated most recent copy scanned in chart (See row information)     Allergies  Allergen Reactions  . Irbesartan Nausea Only    Chief Complaint  Patient presents with  . Discharge Note    Discharge from SNF    HPI:  87 y.o. female  Discharge from SNF to her Apartment in [place]   Patient has a history of hypertension hyperlipidemia, lower extremity edema Patient was admitted from 12/20-12/31 for Left Pleural effusion with Hypoxia Followed by Diagnosis of Lung cancer PET scan and MRI shows Metastatic Disease Oncology has started her on Tagrisso  Patient had Pleural Drainage Cathter removed and she now feels stable enough to go back to her Apartment  She is doing well.  Able to walk with a walker with mild shortness of breath.  Is tolerating take Tagrisso.  Her appetite continues to be poor.  Continues to have early satiety.  Her weight has stayed stable.  No heat chest pain.  Cough occasional but better. Continues to have edema in her lower  extremities   Past Medical History:  Diagnosis Date  . Bruit    Abdominal bruit - Abdominal aorta/renal duplex Doppler evaluation 12/07/03 -Mildly abnormal evaluation. 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 This is consistant w/median arcuate ligament compression syndrome. *Right & Left Kidney: Essentially equal in size, symmetrical in shape w/no significant abnormalities visualized. *Right & Left Renal Arteries: No significant abnormalities.  . Edema extremities    LE edema   . H/O myocardial perfusion scan 02/20/00   To rule out ischemia - Negative adequate Bruce protocol exercise stress test with a deconditioned exercise response and normal static myocardial perfusion images with EF calculated by QGS of 77%. Represents a low risk study.  . Hyperlipidemia   . Hypertension   . Osteoarthritis   . Osteoporosis   . Pleural effusion 04/2020  . Valvular heart disease    Mild valvular heart disease by Echo in 2010, all mild and symptomatic, including concentric LVH, MR, TR, and AI with pulmonary artery pressure of 32. EF was normal.    Past Surgical History:  Procedure Laterality Date  . Abdominal aorta/Renal duplex Doppler Evaluation  12/07/03   For abdominal bruit. Mildly abnormal evaluation. (See bruit in medical history)  . BREAST EXCISIONAL BIOPSY Left 1966  . BREAST EXCISIONAL BIOPSY Left 1999  . CATARACT EXTRACTION  2003  . CHEST TUBE INSERTION N/A 07/04/2020   Procedure: REMOVAL PLEURAL DRAINAGE CATHETER;  Surgeon: Garner Nash, DO;  Location: [facility];  Service: Pulmonary;  Laterality: N/A;  . COLONOSCOPY  10/22/2004  .  DEXA Bone Scan  06/23/2013  . IR PERC PLEURAL DRAIN W/INDWELL CATH W/IMG GUIDE  05/10/2020  . IR THORACENTESIS ASP PLEURAL SPACE W/IMG GUIDE  05/04/2020      reports that she quit smoking about 58 years ago. Her smoking use included cigarettes. She has a 1.50 pack-year smoking history. She has never used smokeless tobacco. She reports current  alcohol use of about 2.0 standard drinks of alcohol per week. She reports that she does not use drugs. Social History   Socioeconomic History  . Marital status: Single    Spouse name: Not on file  . Number of children: Not on file  . Years of education: Not on file  . Highest education level: Not on file  Occupational History  . Occupation: Retired    Fish farm manager: [employer]     Comment: Chemistry professor  Tobacco Use  . Smoking status: Former Smoker    Packs/day: 0.25    Years: 6.00    Pack years: 1.50    Types: Cigarettes    Quit date: 05/13/1962    Years since quitting: 58.1  . Smokeless tobacco: Never Used  Vaping Use  . Vaping Use: Never used  Substance and Sexual Activity  . Alcohol use: Yes    Alcohol/week: 2.0 standard drinks    Types: 2 Standard drinks or equivalent per week    Comment: eine  . Drug use: No  . Sexual activity: Not on file  Other Topics Concern  . Not on file  Social History Narrative  . Not on file   Social Determinants of Health   Financial Resource Strain: Not on file  Food Insecurity: Not on file  Transportation Needs: Not on file  Physical Activity: Not on file  Stress: Not on file  Social Connections: Not on file  Intimate Partner Violence: Not on file   Functional Status Survey:    Allergies  Allergen Reactions  . Irbesartan Nausea Only    Pertinent  Health Maintenance Due  Topic Date Due  . INFLUENZA VACCINE  Completed  . DEXA SCAN  Completed  . PNA vac Low Risk Adult  Completed    Medications: Allergies as of 07/06/2020      Reactions   Irbesartan Nausea Only      Medication List       Accurate as of July 06, 2020 11:59 PM. If you have any questions, ask your nurse or doctor.        acetaminophen 325 MG tablet Commonly known as: TYLENOL Take 2 tablets (650 mg total) by mouth every 6 (six) hours as needed for mild pain (or Fever >/= 101).   amLODipine 2.5 MG tablet Commonly known as: NORVASC Take 2.5  mg by mouth daily.   aspirin 81 MG tablet Take 81 mg by mouth daily.   atorvastatin 20 MG tablet Commonly known as: LIPITOR TAKE 1 TABLET BY MOUTH EVERY DAY   furosemide 20 MG tablet Commonly known as: LASIX Take 1 tablet (20 mg total) by mouth as needed for fluid or edema.   metoprolol succinate 50 MG 24 hr tablet Commonly known as: TOPROL-XL Take 50 mg by mouth daily. Take with or immediately following a meal.   multivitamin tablet Take 1 tablet by mouth daily.   nystatin powder Generic drug: nystatin Apply 1 application topically 2 (two) times daily.   osimertinib mesylate 80 MG tablet Commonly known as: TAGRISSO Take 1 tablet (80 mg total) by mouth daily. Days 1-28.   spironolactone 25 MG  tablet Commonly known as: ALDACTONE Take 1 tablet (25 mg total) by mouth daily.   zinc oxide 20 % ointment Apply 1 application topically as needed for irritation. apply to buttocks/peri, topical, As Needed, To buttocks after every incontinent episode and as needed for redness. May keep at bedside.       Review of Systems  Vitals:   07/07/20 0855  BP: 130/64  Pulse: 76  Resp: 20  Temp: 98 F (36.7 C)  SpO2: 97%  Weight: 129 lb (58.5 kg)  Height: 5\' 4"  (1.626 m)   Body mass index is 22.14 kg/m. Physical Exam  Constitutional: Oriented to person, place, and time. Well-developed and well-nourished.  HENT:  Head: Normocephalic.  Mouth/Throat: Oropharynx is clear and moist.  Eyes: Pupils are equal, round, and reactive to light.  Neck: Neck supple.  Cardiovascular: Normal rate and normal heart sounds.  No murmur heard. Pulmonary/Chest: Effort normal and breath sounds normal. No respiratory distress. No wheezes. She has no rales.  Abdominal: Soft. Bowel sounds are normal. No distension. There is no tenderness. There is no rebound.  Musculoskeletal: Moderate Edema BIlateral  Lymphadenopathy: none Neurological: Alert and oriented to person, place, and time.  Skin: Skin is  warm and dry.  Psychiatric: Normal mood and affect. Behavior is normal. Thought content normal.    Labs reviewed: Basic Metabolic Panel: Recent Labs    05/09/20 0319 05/10/20 0322 05/11/20 0246 05/17/20 1300 05/25/20 0000 06/07/20 1306 06/28/20 1030  NA 136 137 135 136 137 138 136  K 4.3 4.2 3.9 3.4* 5.0 4.1 4.1  CL 106 105 104 99 103 105 105  CO2 26 23 24 26  23* 26 25  GLUCOSE 108* 112* 115* 114*  --  103* 118*  BUN 8 11 12 16 12 11 15   CREATININE 0.79 0.77 0.81 0.90 0.8 0.76 0.85  CALCIUM 9.6 9.8 9.6 10.3 9.9 10.3 10.4*  MG 2.1 2.1 2.1  --   --   --   --   PHOS 3.1 3.0 2.7  --   --   --   --    Liver Function Tests: Recent Labs    05/17/20 1300 06/07/20 1306 06/28/20 1030  AST 27 22 19   ALT 21 17 14   ALKPHOS 76 77 64  BILITOT 0.7 0.5 0.5  PROT 6.7 6.7 7.1  ALBUMIN 3.2* 3.0* 3.5   No results for input(s): LIPASE, AMYLASE in the last 8760 hours. No results for input(s): AMMONIA in the last 8760 hours. CBC: Recent Labs    05/17/20 1300 06/07/20 1306 06/28/20 1030  WBC 13.2* 12.0* 7.6  NEUTROABS 10.9* 10.0* 5.6  HGB 15.2* 14.7 14.2  HCT 44.9 44.4 44.0  MCV 88.4 89.5 90.0  PLT 370 451* 263   Cardiac Enzymes: No results for input(s): CKTOTAL, CKMB, CKMBINDEX, TROPONINI in the last 8760 hours. BNP: Invalid input(s): POCBNP CBG: Recent Labs    05/31/20 0806  GLUCAP 96    Procedures and Imaging Studies During Stay: DG Chest 2 View  Result Date: 06/30/2020 CLINICAL DATA:  Pleural effusion EXAM: CHEST - 2 VIEW COMPARISON:  PET-CT 05/31/2020 FINDINGS: Perihilar opacity and loculated left pleural effusion/thickening. There is a tunneled pleural catheter traversing the lower and then medial left chest. No convincing change from 05/11/2020. No pneumothorax. Clear right lung. Normal heart size. IMPRESSION: Stable left pleural fluid/opacity when compared to 05/12/2020. Stable tunneled catheter positioning. Electronically Signed   By: Monte Fantasia M.D.   On:  06/30/2020 11:06   MR Brain  W Wo Contrast  Result Date: 06/07/2020 CLINICAL DATA:  Non-small cell lung cancer, staging. EXAM: MRI HEAD WITHOUT AND WITH CONTRAST TECHNIQUE: Multiplanar, multiecho pulse sequences of the brain and surrounding structures were obtained without and with intravenous contrast. CONTRAST:  90mL GADAVIST GADOBUTROL 1 MMOL/ML IV SOLN COMPARISON:  None. FINDINGS: Brain: No acute infarction, hemorrhage, hydrocephalus or extra-axial collection. Multiple enhancing lesions consistent with metastatic disease, labeled on series 16, as follows: *Right cerebellar hemisphere, 2 mm, image 40. *Right posterior temporal operculum, 10 mm, image 88. *Left parietal lobe, 5 mm, image 102. *Left parietal lobe, 5 mm, image 111. A 1.5 x 1.3 cm dural-based extra-axial enhancing lesion in the right frontal parietal convexity. No mass effect on the adjacent brain parenchyma. Remote lacunar infarcts in the bilateral cerebellar hemispheres and right caudate head. Scattered foci of T2 hyperintensity are seen within the white matter of the cerebral hemispheres, nonspecific, most likely related to chronic microangiopathic changes. Moderate parenchymal volume loss. Vascular: Normal flow voids. Skull and upper cervical spine: Normal marrow signal. Sinuses/Orbits: Bilateral lens surgery. Paranasal sinuses are essentially clear. IMPRESSION: 1. Multiple enhancing lesions consistent with metastatic disease. 2. A 1.5 x 1.3 cm dural-based extra-axial enhancing lesion in the right frontal parietal convexity, likely a meningioma. However, given presence of metastatic disease to the brain, the possibility of secondary lesion cannot be excluded. 3. Moderate parenchymal volume loss and chronic microangiopathic changes. Remote lacunar infarcts in the bilateral cerebellar hemispheres and right caudate head. Electronically Signed   By: Pedro Earls M.D.   On: 06/07/2020 13:17    Assessment/Plan:   1.  Adenocarcinoma of left lung, stage 4 ([place]) On Oral Chemo Follows with Oncology  2. Essential hypertension Stable on Norvasc and Aldactone  3. Mixed hyperlipidemia On Statin  4. Pleural effusion Cathter is removed  5. Gastroesophageal reflux disease, unspecified whether esophagitis present Prilosec Discontinued at her request  6. Early satiety If Continues ? GI referal as Outpatient  7. Bilateral leg edema On Lasix PRN        Patient has been advised to f/u with their PCP in 1-2 weeks to bring them up to date on their rehab stay.  Social services at facility was responsible for arranging this appointment.  Pt was provided with a 30 day supply of prescriptions for medications and refills must be obtained from their PCP.  For controlled substances, a more limited supply may be provided adequate until PCP appointment only.  Future labs/tests needed:

## 2020-07-10 ENCOUNTER — Telehealth: Payer: Self-pay | Admitting: Internal Medicine

## 2020-07-10 DIAGNOSIS — M1990 Unspecified osteoarthritis, unspecified site: Secondary | ICD-10-CM | POA: Diagnosis not present

## 2020-07-10 DIAGNOSIS — M6281 Muscle weakness (generalized): Secondary | ICD-10-CM | POA: Diagnosis not present

## 2020-07-10 DIAGNOSIS — J90 Pleural effusion, not elsewhere classified: Secondary | ICD-10-CM | POA: Diagnosis not present

## 2020-07-10 NOTE — Telephone Encounter (Signed)
R/s 3/2 appt per provider on call scheduled. Spoke with patient and confirmed new time

## 2020-07-11 ENCOUNTER — Telehealth: Payer: Self-pay | Admitting: Medical Oncology

## 2020-07-11 DIAGNOSIS — R2681 Unsteadiness on feet: Secondary | ICD-10-CM | POA: Diagnosis not present

## 2020-07-11 DIAGNOSIS — J90 Pleural effusion, not elsewhere classified: Secondary | ICD-10-CM | POA: Diagnosis not present

## 2020-07-11 DIAGNOSIS — M6281 Muscle weakness (generalized): Secondary | ICD-10-CM | POA: Diagnosis not present

## 2020-07-11 DIAGNOSIS — R2689 Other abnormalities of gait and mobility: Secondary | ICD-10-CM | POA: Diagnosis not present

## 2020-07-11 DIAGNOSIS — M1990 Unspecified osteoarthritis, unspecified site: Secondary | ICD-10-CM | POA: Diagnosis not present

## 2020-07-11 NOTE — Telephone Encounter (Signed)
Pt left voice mail with her new phone number . She is no longer at rehab . She only left a 6 digit number.  I left vm on her friend ,Mary"s  phone to call me with pt new number.

## 2020-07-12 ENCOUNTER — Other Ambulatory Visit: Payer: Self-pay

## 2020-07-12 ENCOUNTER — Encounter: Payer: Self-pay | Admitting: Internal Medicine

## 2020-07-12 ENCOUNTER — Telehealth: Payer: Self-pay

## 2020-07-12 ENCOUNTER — Inpatient Hospital Stay: Payer: Medicare PPO | Admitting: Internal Medicine

## 2020-07-12 ENCOUNTER — Inpatient Hospital Stay: Payer: Medicare PPO

## 2020-07-12 ENCOUNTER — Inpatient Hospital Stay: Payer: Medicare PPO | Attending: Physician Assistant

## 2020-07-12 VITALS — BP 140/88 | HR 86 | Temp 97.6°F | Resp 16 | Ht 64.0 in | Wt 119.3 lb

## 2020-07-12 DIAGNOSIS — C3412 Malignant neoplasm of upper lobe, left bronchus or lung: Secondary | ICD-10-CM | POA: Insufficient documentation

## 2020-07-12 DIAGNOSIS — C782 Secondary malignant neoplasm of pleura: Secondary | ICD-10-CM | POA: Diagnosis not present

## 2020-07-12 DIAGNOSIS — C772 Secondary and unspecified malignant neoplasm of intra-abdominal lymph nodes: Secondary | ICD-10-CM

## 2020-07-12 DIAGNOSIS — Z7982 Long term (current) use of aspirin: Secondary | ICD-10-CM | POA: Diagnosis not present

## 2020-07-12 DIAGNOSIS — C3492 Malignant neoplasm of unspecified part of left bronchus or lung: Secondary | ICD-10-CM | POA: Diagnosis not present

## 2020-07-12 DIAGNOSIS — C7931 Secondary malignant neoplasm of brain: Secondary | ICD-10-CM | POA: Diagnosis not present

## 2020-07-12 DIAGNOSIS — C3490 Malignant neoplasm of unspecified part of unspecified bronchus or lung: Secondary | ICD-10-CM | POA: Diagnosis not present

## 2020-07-12 DIAGNOSIS — C779 Secondary and unspecified malignant neoplasm of lymph node, unspecified: Secondary | ICD-10-CM | POA: Insufficient documentation

## 2020-07-12 DIAGNOSIS — Z79899 Other long term (current) drug therapy: Secondary | ICD-10-CM | POA: Diagnosis not present

## 2020-07-12 DIAGNOSIS — I10 Essential (primary) hypertension: Secondary | ICD-10-CM | POA: Insufficient documentation

## 2020-07-12 DIAGNOSIS — Z5111 Encounter for antineoplastic chemotherapy: Secondary | ICD-10-CM

## 2020-07-12 DIAGNOSIS — R630 Anorexia: Secondary | ICD-10-CM | POA: Diagnosis not present

## 2020-07-12 LAB — CBC WITH DIFFERENTIAL (CANCER CENTER ONLY)
Abs Immature Granulocytes: 0.01 10*3/uL (ref 0.00–0.07)
Basophils Absolute: 0 10*3/uL (ref 0.0–0.1)
Basophils Relative: 0 %
Eosinophils Absolute: 0.1 10*3/uL (ref 0.0–0.5)
Eosinophils Relative: 1 %
HCT: 39.4 % (ref 36.0–46.0)
Hemoglobin: 13 g/dL (ref 12.0–15.0)
Immature Granulocytes: 0 %
Lymphocytes Relative: 10 %
Lymphs Abs: 0.8 10*3/uL (ref 0.7–4.0)
MCH: 29.1 pg (ref 26.0–34.0)
MCHC: 33 g/dL (ref 30.0–36.0)
MCV: 88.1 fL (ref 80.0–100.0)
Monocytes Absolute: 0.9 10*3/uL (ref 0.1–1.0)
Monocytes Relative: 11 %
Neutro Abs: 6.4 10*3/uL (ref 1.7–7.7)
Neutrophils Relative %: 78 %
Platelet Count: 271 10*3/uL (ref 150–400)
RBC: 4.47 MIL/uL (ref 3.87–5.11)
RDW: 16.5 % — ABNORMAL HIGH (ref 11.5–15.5)
WBC Count: 8.2 10*3/uL (ref 4.0–10.5)
nRBC: 0 % (ref 0.0–0.2)

## 2020-07-12 LAB — CMP (CANCER CENTER ONLY)
ALT: 14 U/L (ref 0–44)
AST: 18 U/L (ref 15–41)
Albumin: 3.4 g/dL — ABNORMAL LOW (ref 3.5–5.0)
Alkaline Phosphatase: 58 U/L (ref 38–126)
Anion gap: 7 (ref 5–15)
BUN: 17 mg/dL (ref 8–23)
CO2: 25 mmol/L (ref 22–32)
Calcium: 10.6 mg/dL — ABNORMAL HIGH (ref 8.9–10.3)
Chloride: 103 mmol/L (ref 98–111)
Creatinine: 1.01 mg/dL — ABNORMAL HIGH (ref 0.44–1.00)
GFR, Estimated: 55 mL/min — ABNORMAL LOW (ref >60)
Glucose, Bld: 119 mg/dL — ABNORMAL HIGH (ref 70–99)
Potassium: 4.5 mmol/L (ref 3.5–5.1)
Sodium: 135 mmol/L (ref 135–145)
Total Bilirubin: 0.4 mg/dL (ref 0.3–1.2)
Total Protein: 7.1 g/dL (ref 6.5–8.1)

## 2020-07-12 MED ORDER — AMLODIPINE BESYLATE 2.5 MG PO TABS
2.5000 mg | ORAL_TABLET | Freq: Every day | ORAL | 3 refills | Status: DC
Start: 2020-07-12 — End: 2020-10-05

## 2020-07-12 NOTE — Telephone Encounter (Signed)
Patient requested refill

## 2020-07-12 NOTE — Progress Notes (Signed)
[place] [phone]   Fax:([phone]  OFFICE PROGRESS NOTE  Cari Caraway, MD [place] [address]  DIAGNOSIS: Stage IV (T2b, N2, M1c), non-small cell lung cancer, adenocarcinoma.  The patient presented with a left upper lobe mass, right supra clavicular, left hilar and subcarinal lymphadenopathy as well as diffuse left-sided pleural disease with associated malignant left pleural effusion and 2 small lymph nodes near the SMA in addition to multiple brain metastasis diagnosed in December 2021.   Molecular studies by [facility] showed positive EGFR mutation with deletion in exon 19  PRIOR THERAPY: None  CURRENT THERAPY: Tagrisso 80 mg p.o. daily. First dose June 10, 2020.  Status post 1 months of treatment  INTERVAL HISTORY: Shelley Flores 87 y.o. female returns to the clinic today for follow-up visit.  The patient is feeling fine today with no concerning complaints except for the lack of appetite.  She denied having any current chest pain, shortness of breath, cough or hemoptysis.  She denied having any fever or chills.  She has no nausea, vomiting, diarrhea or constipation.  She has no headache or visual changes.  She is back to independent living at her home.  The patient continues to tolerate her treatment with Tagrisso fairly well.  She is here today for evaluation and repeat blood work.  MEDICAL HISTORY: Past Medical History:  Diagnosis Date  . Bruit    Abdominal bruit - Abdominal aorta/renal duplex Doppler evaluation 12/07/03 -Mildly abnormal evaluation. 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 This is consistant w/median arcuate ligament compression syndrome. *Right & Left Kidney: Essentially equal in size, symmetrical in shape w/no significant abnormalities visualized. *Right & Left Renal Arteries: No significant abnormalities.  . Edema extremities    LE edema   . H/O myocardial perfusion scan  02/20/00   To rule out ischemia - Negative adequate Bruce protocol exercise stress test with a deconditioned exercise response and normal static myocardial perfusion images with EF calculated by QGS of 77%. Represents a low risk study.  . Hyperlipidemia   . Hypertension   . Osteoarthritis   . Osteoporosis   . Pleural effusion 04/2020  . Valvular heart disease    Mild valvular heart disease by Echo in 2010, all mild and symptomatic, including concentric LVH, MR, TR, and AI with pulmonary artery pressure of 32. EF was normal.    ALLERGIES:  is allergic to irbesartan.  MEDICATIONS:  Current Outpatient Medications  Medication Sig Dispense Refill  . acetaminophen (TYLENOL) 325 MG tablet Take 2 tablets (650 mg total) by mouth every 6 (six) hours as needed for mild pain (or Fever >/= 101). 20 tablet 0  . amLODipine (NORVASC) 2.5 MG tablet Take 2.5 mg by mouth daily.    [name] aspirin 81 MG tablet Take 81 mg by mouth daily.    [name] atorvastatin (LIPITOR) 20 MG tablet TAKE 1 TABLET BY MOUTH EVERY DAY 90 tablet 1  . furosemide (LASIX) 20 MG tablet Take 1 tablet (20 mg total) by mouth as needed for fluid or edema. 30 tablet 6  . metoprolol succinate (TOPROL-XL) 50 MG 24 hr tablet Take 50 mg by mouth daily. Take with or immediately following a meal.    . Multiple Vitamin (MULTIVITAMIN) tablet Take 1 tablet by mouth daily.    [name] osimertinib mesylate (TAGRISSO) 80 MG tablet Take 1 tablet (80 mg total) by mouth daily. Days 1-28. 30 tablet 3  . spironolactone (ALDACTONE)  25 MG tablet Take 1 tablet (25 mg total) by mouth daily. 90 tablet 3  . zinc oxide 20 % ointment Apply 1 application topically as needed for irritation. apply to buttocks/peri, topical, As Needed, To buttocks after every incontinent episode and as needed for redness. May keep at bedside.     No current facility-administered medications for this visit.    SURGICAL HISTORY:  Past Surgical History:  Procedure Laterality Date  . Abdominal  aorta/Renal duplex Doppler Evaluation  12/07/03   For abdominal bruit. Mildly abnormal evaluation. (See bruit in medical history)  . BREAST EXCISIONAL BIOPSY Left 1966  . BREAST EXCISIONAL BIOPSY Left 1999  . CATARACT EXTRACTION  2003  . CHEST TUBE INSERTION N/A 07/04/2020   Procedure: REMOVAL PLEURAL DRAINAGE CATHETER;  Surgeon: Garner Nash, DO;  Location: [facility];  Service: Pulmonary;  Laterality: N/A;  . COLONOSCOPY  10/22/2004  . DEXA Bone Scan  06/23/2013  . IR PERC PLEURAL DRAIN W/INDWELL CATH W/IMG GUIDE  05/10/2020  . IR THORACENTESIS ASP PLEURAL SPACE W/IMG GUIDE  05/04/2020    REVIEW OF SYSTEMS:  A comprehensive review of systems was negative except for: Constitutional: positive for anorexia and fatigue   PHYSICAL EXAMINATION: General appearance: alert, cooperative, fatigued and no distress Head: Normocephalic, without obvious abnormality, atraumatic Neck: no adenopathy, no JVD, supple, symmetrical, trachea midline and thyroid not enlarged, symmetric, no tenderness/mass/nodules Lymph nodes: Cervical, supraclavicular, and axillary nodes normal. Resp: clear to auscultation bilaterally Back: symmetric, no curvature. ROM normal. No CVA tenderness. Cardio: regular rate and rhythm, S1, S2 normal, no murmur, click, rub or gallop GI: soft, non-tender; bowel sounds normal; no masses,  no organomegaly Extremities: edema 1+ edema bilaterally  ECOG PERFORMANCE STATUS: 1 - Symptomatic but completely ambulatory  Blood pressure 140/88, pulse 86, temperature 97.6 F (36.4 C), temperature source Tympanic, resp. rate 16, height '5\' 4"'  (1.626 m), weight 119 lb 4.8 oz (54.1 kg), SpO2 100 %.  LABORATORY DATA: Lab Results  Component Value Date   WBC 8.2 07/12/2020   HGB 13.0 07/12/2020   HCT 39.4 07/12/2020   MCV 88.1 07/12/2020   PLT 271 07/12/2020      Chemistry      Component Value Date/Time   NA 136 06/28/2020 1030   NA 137 05/25/2020 0000   K 4.1 06/28/2020 1030   CL 105  06/28/2020 1030   CO2 25 06/28/2020 1030   BUN 15 06/28/2020 1030   BUN 12 05/25/2020 0000   CREATININE 0.85 06/28/2020 1030   GLU 83 05/25/2020 0000      Component Value Date/Time   CALCIUM 10.4 (H) 06/28/2020 1030   ALKPHOS 64 06/28/2020 1030   AST 19 06/28/2020 1030   ALT 14 06/28/2020 1030   BILITOT 0.5 06/28/2020 1030       RADIOGRAPHIC STUDIES: DG Chest 2 View  Result Date: 06/30/2020 CLINICAL DATA:  Pleural effusion EXAM: CHEST - 2 VIEW COMPARISON:  PET-CT 05/31/2020 FINDINGS: Perihilar opacity and loculated left pleural effusion/thickening. There is a tunneled pleural catheter traversing the lower and then medial left chest. No convincing change from 05/11/2020. No pneumothorax. Clear right lung. Normal heart size. IMPRESSION: Stable left pleural fluid/opacity when compared to 05/12/2020. Stable tunneled catheter positioning. Electronically Signed   By: Monte Fantasia M.D.   On: 06/30/2020 11:06    ASSESSMENT AND PLAN: This is a very pleasant 87 years old white female recently diagnosed with a stage IV (T3, N3, M1 C) non-small cell lung cancer, adenocarcinoma with positive  EGFR mutation with deletion in exon 73 diagnosed in December 2021 and presented with left lower lobe lung mass in addition to right supraclavicular, left hilar and subcarinal lymphadenopathy as well as metastatic lymphadenopathy in the abdomen and left pleural-based metastasis as well as malignant left pleural effusion and multiple brain metastasis. The molecular study showed positive EGFR mutation with deletion in exon 19. The patient started treatment with Tagrisso on June 10, 2020. The patient continues to tolerate her treatment well with no concerning adverse effects. I recommended for her to continue her current treatment with Tagrisso with the same dose. I will see her back for follow-up visit in 1 months for evaluation with repeat CT scan of the chest, abdomen pelvis as well as MRI of the brain for  close monitoring of her brain metastasis. For the lack of appetite I offered the patient treatment with Medrol Dosepak but she would work on her diet and increase her oral intake. She was advised to call immediately if she has any other concerning symptoms in the interval. The patient voices understanding of current disease status and treatment options and is in agreement with the current care plan.  All questions were answered. The patient knows to call the clinic with any problems, questions or concerns. We can certainly see the patient much sooner if necessary.   Disclaimer: This note was dictated with voice recognition software. Similar sounding words can inadvertently be transcribed and may not be corrected upon review.

## 2020-07-12 NOTE — Telephone Encounter (Signed)
Yes that is fine

## 2020-07-12 NOTE — Telephone Encounter (Signed)
Patient called stating that she is establishing care with you in 2 weeks and needs refill on Amlodipine. She states that she has about 1 week worth left. Is it ok to refill this or do you want to wait until she is seen by you first. Please advise.

## 2020-07-13 DIAGNOSIS — R2689 Other abnormalities of gait and mobility: Secondary | ICD-10-CM | POA: Diagnosis not present

## 2020-07-13 DIAGNOSIS — M1990 Unspecified osteoarthritis, unspecified site: Secondary | ICD-10-CM | POA: Diagnosis not present

## 2020-07-13 DIAGNOSIS — R2681 Unsteadiness on feet: Secondary | ICD-10-CM | POA: Diagnosis not present

## 2020-07-13 DIAGNOSIS — M6281 Muscle weakness (generalized): Secondary | ICD-10-CM | POA: Diagnosis not present

## 2020-07-13 DIAGNOSIS — J90 Pleural effusion, not elsewhere classified: Secondary | ICD-10-CM | POA: Diagnosis not present

## 2020-07-14 ENCOUNTER — Telehealth: Payer: Self-pay | Admitting: Internal Medicine

## 2020-07-14 NOTE — Telephone Encounter (Signed)
Scheduled per los. Called and spoke with patient. Confirmed appt 

## 2020-07-16 ENCOUNTER — Other Ambulatory Visit: Payer: Self-pay | Admitting: Cardiology

## 2020-07-17 ENCOUNTER — Telehealth: Payer: Self-pay | Admitting: Medical Oncology

## 2020-07-17 DIAGNOSIS — R2689 Other abnormalities of gait and mobility: Secondary | ICD-10-CM | POA: Diagnosis not present

## 2020-07-17 DIAGNOSIS — M6281 Muscle weakness (generalized): Secondary | ICD-10-CM | POA: Diagnosis not present

## 2020-07-17 DIAGNOSIS — R2681 Unsteadiness on feet: Secondary | ICD-10-CM | POA: Diagnosis not present

## 2020-07-17 DIAGNOSIS — M1990 Unspecified osteoarthritis, unspecified site: Secondary | ICD-10-CM | POA: Diagnosis not present

## 2020-07-17 DIAGNOSIS — J90 Pleural effusion, not elsewhere classified: Secondary | ICD-10-CM | POA: Diagnosis not present

## 2020-07-17 NOTE — Telephone Encounter (Signed)
Oral barium - Pt does not want to drink it due to N/V-Radiology notified.

## 2020-07-18 DIAGNOSIS — R2681 Unsteadiness on feet: Secondary | ICD-10-CM | POA: Diagnosis not present

## 2020-07-18 DIAGNOSIS — M6281 Muscle weakness (generalized): Secondary | ICD-10-CM | POA: Diagnosis not present

## 2020-07-18 DIAGNOSIS — J90 Pleural effusion, not elsewhere classified: Secondary | ICD-10-CM | POA: Diagnosis not present

## 2020-07-18 DIAGNOSIS — M1990 Unspecified osteoarthritis, unspecified site: Secondary | ICD-10-CM | POA: Diagnosis not present

## 2020-07-18 DIAGNOSIS — R2689 Other abnormalities of gait and mobility: Secondary | ICD-10-CM | POA: Diagnosis not present

## 2020-07-19 DIAGNOSIS — R2681 Unsteadiness on feet: Secondary | ICD-10-CM | POA: Diagnosis not present

## 2020-07-19 DIAGNOSIS — R2689 Other abnormalities of gait and mobility: Secondary | ICD-10-CM | POA: Diagnosis not present

## 2020-07-19 DIAGNOSIS — J90 Pleural effusion, not elsewhere classified: Secondary | ICD-10-CM | POA: Diagnosis not present

## 2020-07-19 DIAGNOSIS — M6281 Muscle weakness (generalized): Secondary | ICD-10-CM | POA: Diagnosis not present

## 2020-07-19 DIAGNOSIS — M1990 Unspecified osteoarthritis, unspecified site: Secondary | ICD-10-CM | POA: Diagnosis not present

## 2020-07-21 DIAGNOSIS — M6281 Muscle weakness (generalized): Secondary | ICD-10-CM | POA: Diagnosis not present

## 2020-07-21 DIAGNOSIS — R2681 Unsteadiness on feet: Secondary | ICD-10-CM | POA: Diagnosis not present

## 2020-07-21 DIAGNOSIS — M1990 Unspecified osteoarthritis, unspecified site: Secondary | ICD-10-CM | POA: Diagnosis not present

## 2020-07-21 DIAGNOSIS — J90 Pleural effusion, not elsewhere classified: Secondary | ICD-10-CM | POA: Diagnosis not present

## 2020-07-21 DIAGNOSIS — R2689 Other abnormalities of gait and mobility: Secondary | ICD-10-CM | POA: Diagnosis not present

## 2020-07-25 DIAGNOSIS — M1990 Unspecified osteoarthritis, unspecified site: Secondary | ICD-10-CM | POA: Diagnosis not present

## 2020-07-25 DIAGNOSIS — R2689 Other abnormalities of gait and mobility: Secondary | ICD-10-CM | POA: Diagnosis not present

## 2020-07-25 DIAGNOSIS — M6281 Muscle weakness (generalized): Secondary | ICD-10-CM | POA: Diagnosis not present

## 2020-07-25 DIAGNOSIS — R2681 Unsteadiness on feet: Secondary | ICD-10-CM | POA: Diagnosis not present

## 2020-07-25 DIAGNOSIS — J90 Pleural effusion, not elsewhere classified: Secondary | ICD-10-CM | POA: Diagnosis not present

## 2020-07-26 ENCOUNTER — Other Ambulatory Visit: Payer: Self-pay

## 2020-07-26 ENCOUNTER — Encounter: Payer: Self-pay | Admitting: Internal Medicine

## 2020-07-26 ENCOUNTER — Non-Acute Institutional Stay: Payer: Medicare PPO | Admitting: Internal Medicine

## 2020-07-26 VITALS — BP 128/68 | HR 72 | Temp 96.0°F | Ht 64.0 in | Wt 118.2 lb

## 2020-07-26 DIAGNOSIS — R600 Localized edema: Secondary | ICD-10-CM | POA: Diagnosis not present

## 2020-07-26 DIAGNOSIS — C3492 Malignant neoplasm of unspecified part of left bronchus or lung: Secondary | ICD-10-CM | POA: Diagnosis not present

## 2020-07-26 DIAGNOSIS — M6281 Muscle weakness (generalized): Secondary | ICD-10-CM | POA: Diagnosis not present

## 2020-07-26 DIAGNOSIS — M1990 Unspecified osteoarthritis, unspecified site: Secondary | ICD-10-CM | POA: Diagnosis not present

## 2020-07-26 DIAGNOSIS — I10 Essential (primary) hypertension: Secondary | ICD-10-CM

## 2020-07-26 DIAGNOSIS — J90 Pleural effusion, not elsewhere classified: Secondary | ICD-10-CM

## 2020-07-26 DIAGNOSIS — K219 Gastro-esophageal reflux disease without esophagitis: Secondary | ICD-10-CM

## 2020-07-26 DIAGNOSIS — E782 Mixed hyperlipidemia: Secondary | ICD-10-CM | POA: Diagnosis not present

## 2020-07-26 DIAGNOSIS — R6881 Early satiety: Secondary | ICD-10-CM

## 2020-07-26 DIAGNOSIS — R2681 Unsteadiness on feet: Secondary | ICD-10-CM | POA: Diagnosis not present

## 2020-07-26 DIAGNOSIS — R2689 Other abnormalities of gait and mobility: Secondary | ICD-10-CM | POA: Diagnosis not present

## 2020-07-27 DIAGNOSIS — R2681 Unsteadiness on feet: Secondary | ICD-10-CM | POA: Diagnosis not present

## 2020-07-27 DIAGNOSIS — M1990 Unspecified osteoarthritis, unspecified site: Secondary | ICD-10-CM | POA: Diagnosis not present

## 2020-07-27 DIAGNOSIS — M6281 Muscle weakness (generalized): Secondary | ICD-10-CM | POA: Diagnosis not present

## 2020-07-27 DIAGNOSIS — J90 Pleural effusion, not elsewhere classified: Secondary | ICD-10-CM | POA: Diagnosis not present

## 2020-07-27 DIAGNOSIS — R2689 Other abnormalities of gait and mobility: Secondary | ICD-10-CM | POA: Diagnosis not present

## 2020-07-27 NOTE — Progress Notes (Signed)
Location:  [facility] of Service:  Clinic (12)  Provider:   Code Status:  Goals of Care:  Advanced Directives 05/17/2020  Does Patient Have a Medical Advance Directive? Yes  Type of Advance Directive [place]  Does patient want to make changes to medical advance directive? No - Patient declined  Copy of [place] in Chart? Yes - validated most recent copy scanned in chart (See row information)     Chief Complaint  Patient presents with  . Medical Management of Chronic Issues    Patient returns to the clinic for follow up from SNF discharge.     HPI: Patient is a 87 y.o. female seen today for medical management of chronic diseases.    Patient has a history of hypertension hyperlipidemia, lower extremity edema Patient was admitted from 12/20-12/31 for Left Pleural effusion with Hypoxia Followed by Diagnosis of Lung cancer PET scan and MRI shows Metastatic Disease Oncology has started her on [place]  Patient had Pleural Drainage Cathter removed and she was discharged to her apartment  Patient continues to do well in her apartment.  Is going to be discharged by OT and PT tomorrow.  Able to perform her ADLs with no issues.  Able to walk with her walker with mild SOB Her only issue continues to be early satiety.  Has lost few more pounds.  She eats small amount.  Feels full all the time.  Denies any abdominal pain nausea or vomiting. Edema in her lower extremity is better  Patient does not have any close family.  She never married and had no Kids Her friend is her healthcare power of attorney  Past Medical History:  Diagnosis Date  . Bruit    Abdominal bruit - Abdominal aorta/renal duplex Doppler evaluation 12/07/03 -Mildly abnormal evaluation. 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 This is consistant w/median arcuate ligament compression syndrome. *Right & Left Kidney: Essentially equal in size, symmetrical in  shape w/no significant abnormalities visualized. *Right & Left Renal Arteries: No significant abnormalities.  . Edema extremities    LE edema   . H/O myocardial perfusion scan 02/20/00   To rule out ischemia - Negative adequate Bruce protocol exercise stress test with a deconditioned exercise response and normal static myocardial perfusion images with EF calculated by QGS of 77%. Represents a low risk study.  . Hyperlipidemia   . Hypertension   . Osteoarthritis   . Osteoporosis   . Pleural effusion 04/2020  . Valvular heart disease    Mild valvular heart disease by Echo in 2010, all mild and symptomatic, including concentric LVH, MR, TR, and AI with pulmonary artery pressure of 32. EF was normal.    Past Surgical History:  Procedure Laterality Date  . Abdominal aorta/Renal duplex Doppler Evaluation  12/07/03   For abdominal bruit. Mildly abnormal evaluation. (See bruit in medical history)  . BREAST EXCISIONAL BIOPSY Left 1966  . BREAST EXCISIONAL BIOPSY Left 1999  . CATARACT EXTRACTION  2003  . CHEST TUBE INSERTION N/A 07/04/2020   Procedure: REMOVAL PLEURAL DRAINAGE CATHETER;  Surgeon: Garner Nash, DO;  Location: [facility];  Service: Pulmonary;  Laterality: N/A;  . COLONOSCOPY  10/22/2004  . DEXA Bone Scan  06/23/2013  . IR PERC PLEURAL DRAIN W/INDWELL CATH W/IMG GUIDE  05/10/2020  . IR THORACENTESIS ASP PLEURAL SPACE W/IMG GUIDE  05/04/2020    Allergies  Allergen Reactions  . Irbesartan Nausea Only  Outpatient Encounter Medications as of 07/26/2020  Medication Sig  . acetaminophen (TYLENOL) 325 MG tablet Take 2 tablets (650 mg total) by mouth every 6 (six) hours as needed for mild pain (or Fever >/= 101).  [name] amLODipine (NORVASC) 2.5 MG tablet Take 1 tablet (2.5 mg total) by mouth daily.  [name] aspirin 81 MG tablet Take 81 mg by mouth daily.  [name] atorvastatin (LIPITOR) 20 MG tablet Take 1 tablet (20 mg total) by mouth at bedtime.  . furosemide (LASIX) 20 MG tablet Take 1  tablet (20 mg total) by mouth as needed for fluid or edema.  . metoprolol succinate (TOPROL-XL) 50 MG 24 hr tablet Take 50 mg by mouth daily. Take with or immediately following a meal.  . Multiple Vitamin (MULTIVITAMIN) tablet Take 1 tablet by mouth daily.  [name] osimertinib mesylate (TAGRISSO) 80 MG tablet Take 1 tablet (80 mg total) by mouth daily. Days 1-28.  [name] spironolactone (ALDACTONE) 25 MG tablet Take 1 tablet (25 mg total) by mouth daily.  [name] zinc oxide 20 % ointment Apply 1 application topically as needed for irritation. apply to buttocks/peri, topical, As Needed, To buttocks after every incontinent episode and as needed for redness. May keep at bedside.   No facility-administered encounter medications on file as of 07/26/2020.    Review of Systems:  Review of Systems  Constitutional: Positive for appetite change and unexpected weight change.  HENT: Negative.   Respiratory: Negative.   Cardiovascular: Positive for leg swelling.  Gastrointestinal: Negative.   Genitourinary: Negative.   Musculoskeletal: Negative.   Skin: Negative.   Neurological: Positive for weakness.  Psychiatric/Behavioral: Negative.     Health Maintenance  Topic Date Due  . COVID-19 Vaccine (4 - Booster) 09/24/2020  . TETANUS/TDAP  10/17/2029  . INFLUENZA VACCINE  Completed  . DEXA SCAN  Completed  . PNA vac Low Risk Adult  Completed  . HPV VACCINES  Aged Out    Physical Exam: Vitals:   07/26/20 1611  BP: 128/68  Pulse: 72  Temp: (!) 96 F (35.6 C)  SpO2: 99%  Weight: 118 lb 3.2 oz (53.6 kg)  Height: 5\' 4"  (1.626 m)   Body mass index is 20.29 kg/m. Physical Exam Constitutional: Oriented to person, place, and time. Well-developed and well-nourished.  HENT:  Head: Normocephalic.  Mouth/Throat: Oropharynx is clear and moist.  Eyes: Pupils are equal, round, and reactive to light.  Neck: Neck supple.  Cardiovascular: Normal rate and normal heart sounds.  No murmur heard. Pulmonary/Chest: Effort  normal and breath sounds normal. No respiratory distress. No wheezes. She has no rales.  Abdominal: Soft. Bowel sounds are normal. No distension. There is no tenderness. There is no rebound.  Musculoskeletal: Moderate edema Bilateral.  Lymphadenopathy: none Neurological: Alert and oriented to person, place, and time.  Skin: Skin is warm and dry.  Psychiatric: Normal mood and affect. Behavior is normal. Thought content normal.   Labs reviewed: Basic Metabolic Panel: Recent Labs    05/09/20 0319 05/10/20 0322 05/11/20 0246 05/17/20 1300 06/07/20 1306 06/28/20 1030 07/12/20 1018  NA 136 137 135   < > 138 136 135  K 4.3 4.2 3.9   < > 4.1 4.1 4.5  CL 106 105 104   < > 105 105 103  CO2 26 23 24    < > 26 25 25   GLUCOSE 108* 112* 115*   < > 103* 118* 119*  BUN 8 11 12    < > 11 15 17   CREATININE 0.79 0.77  0.81   < > 0.76 0.85 1.01*  CALCIUM 9.6 9.8 9.6   < > 10.3 10.4* 10.6*  MG 2.1 2.1 2.1  --   --   --   --   PHOS 3.1 3.0 2.7  --   --   --   --    < > = values in this interval not displayed.   Liver Function Tests: Recent Labs    06/07/20 1306 06/28/20 1030 07/12/20 1018  AST 22 19 18   ALT 17 14 14   ALKPHOS 77 64 58  BILITOT 0.5 0.5 0.4  PROT 6.7 7.1 7.1  ALBUMIN 3.0* 3.5 3.4*   No results for input(s): LIPASE, AMYLASE in the last 8760 hours. No results for input(s): AMMONIA in the last 8760 hours. CBC: Recent Labs    06/07/20 1306 06/28/20 1030 07/12/20 1018  WBC 12.0* 7.6 8.2  NEUTROABS 10.0* 5.6 6.4  HGB 14.7 14.2 13.0  HCT 44.4 44.0 39.4  MCV 89.5 90.0 88.1  PLT 451* 263 271   Lipid Panel: Recent Labs    04/14/20 0829  CHOL 139  HDL 53  LDLCALC 68  TRIG 97  CHOLHDL 2.6   No results found for: HGBA1C  Procedures since last visit: DG Chest 2 View  Result Date: 06/30/2020 CLINICAL DATA:  Pleural effusion EXAM: CHEST - 2 VIEW COMPARISON:  PET-CT 05/31/2020 FINDINGS: Perihilar opacity and loculated left pleural effusion/thickening. There is a tunneled  pleural catheter traversing the lower and then medial left chest. No convincing change from 05/11/2020. No pneumothorax. Clear right lung. Normal heart size. IMPRESSION: Stable left pleural fluid/opacity when compared to 05/12/2020. Stable tunneled catheter positioning. Electronically Signed   By: Monte Fantasia M.D.   On: 06/30/2020 11:06    Assessment/Plan 1. Adenocarcinoma of left lung, stage 4 ([place]) On Oral Chemo Per Oncology  2. Essential hypertension Controlled on Norvasc and Aldactone and Toprol  3. Mixed hyperlipidemia Continue Statin Last LDL in 12/21 68  4. Pleural effusion Mostly resolved Repeat CT scan [place]  5. Gastroesophageal reflux disease, unspecified whether esophagitis present Refused Prilosec Say her symptoms are controlled  6. Early satiety Discussed again with the patient.  We collected GI consult She is scheduled for repeat CT scan of the chest and abdomen.  7. Bilateral leg edema Doing well on Aldactone.  Not using Lasix    Labs/tests ordered:  * No order type specified * Next appt:  11/08/2020

## 2020-08-01 DIAGNOSIS — M6281 Muscle weakness (generalized): Secondary | ICD-10-CM | POA: Diagnosis not present

## 2020-08-01 DIAGNOSIS — J90 Pleural effusion, not elsewhere classified: Secondary | ICD-10-CM | POA: Diagnosis not present

## 2020-08-01 DIAGNOSIS — R2681 Unsteadiness on feet: Secondary | ICD-10-CM | POA: Diagnosis not present

## 2020-08-01 DIAGNOSIS — R2689 Other abnormalities of gait and mobility: Secondary | ICD-10-CM | POA: Diagnosis not present

## 2020-08-01 DIAGNOSIS — M1990 Unspecified osteoarthritis, unspecified site: Secondary | ICD-10-CM | POA: Diagnosis not present

## 2020-08-02 MED FILL — TAGRISSO 80 MG TABLET: 80 | 30 days supply | Qty: 30 | Fill #2

## 2020-08-08 ENCOUNTER — Encounter: Payer: Self-pay | Admitting: Internal Medicine

## 2020-08-08 ENCOUNTER — Other Ambulatory Visit (HOSPITAL_COMMUNITY): Payer: Self-pay

## 2020-08-08 ENCOUNTER — Other Ambulatory Visit: Payer: Self-pay

## 2020-08-08 ENCOUNTER — Ambulatory Visit: Payer: Medicare PPO | Admitting: Internal Medicine

## 2020-08-08 VITALS — BP 127/66 | HR 73 | Ht 64.0 in | Wt 116.6 lb

## 2020-08-08 DIAGNOSIS — J90 Pleural effusion, not elsewhere classified: Secondary | ICD-10-CM | POA: Diagnosis not present

## 2020-08-08 DIAGNOSIS — R600 Localized edema: Secondary | ICD-10-CM

## 2020-08-08 DIAGNOSIS — I10 Essential (primary) hypertension: Secondary | ICD-10-CM

## 2020-08-08 DIAGNOSIS — C3490 Malignant neoplasm of unspecified part of unspecified bronchus or lung: Secondary | ICD-10-CM | POA: Diagnosis not present

## 2020-08-08 NOTE — Progress Notes (Signed)
OFFICE NOTE  Chief Complaint:  Follow-up  Primary Care Physician: [name], MD  HPI:  Shelley Flores  Is a 87 year old female who comes in for followup. She has hypertension and hyperlipidemia and had had an echocardiogram in 2010 that showed mild concentric LVH, mild AI, mild MR/TR with pulmonary artery pressure of 32. Her EF was normal. She has no specific cardiac complaints. Blood pressure and cholesterol have been well-controlled. She is due for recheck of her lipid profile. Unfortunately she does not get much exercise but she is looking into it and possibly considering the [employer] program.  I saw Shelley Flores back in the office today. Overall she is doing well except for some leg swelling at the end of the day. Denies any chest pain or worsening shortness of breath. EKG looks unchanged.  02/26/2016  Mrs. Reeves Flores returns today for follow-up. Over the past year she is done fairly well. She denies any chest pain or worsening shortness of breath. Initially blood pressure was elevated 158/78 however recheck came down to 130/70. She is on low-dose aspirin. She is due for recheck of her lipid profile. She has not had any worsening palpitations.   03/11/2017  Shelley Flores returns today for follow-up.  She is done well over the past year.  She is since moved into [facility] [place].  She is very happy there.  This is an assisted living facility.  She denies any chest pain or worsening shortness of breath.  Blood pressure is improved today.  She gets annual lipid profiles and is due for that as well.  She denies any problems with her medications.  She says she is sleeping well.  The only issue is she had some imbalance however she has been working with physical therapy on that.  03/18/2018  Shelley Flores is seen today in routine follow-up.  Overall she is doing well without new complaints.  She continues to live at [facility] [place].  She says she is not as active as she like to  be.  Weight has fluctuated some.  She denies any chest pain or shortness of breath.  EKG shows sinus rhythm.  Blood pressure is well controlled today.  03/24/2019  Shelley Flores is seen today in follow-up.  She denies any new chest pain or worsening shortness of breath.  Her most recent lipid profile showed total cholesterol 168, triglycerides 102, HDL 53 and LDL 95.  This is actually at target with goal LDL less than 100.  The study however was almost a year ago.  She remains on a atorvastatin 20 mg.  Blood pressure is significantly elevated today at 179/84.  She brought a list of blood pressure readings that were taken at [facility] indicating her blood pressure was 120/80 generally by manual cuff.  Her personal automatic cuff however shows a blood pressure to be about 355-732 systolic.  She also is complaining of some lower extremity edema, particularly pedal edema which is worsened recently and does not necessarily go away with elevating her feet at night.  03/24/2020  Shelley Flores returns today for follow-up.  She continues to struggle with lower extremity edema and I think suboptimal blood pressure control.  I had previously put her on some thiazide diuretic however she does not seem like she has had significant improvement with that.  Pressure here today was 152/84.  She says at home she gets around 202 systolic.  At her clinic at [facility] they get around 120 but she doubts  that number.  06/15/2020  Shelley Flores is seen today for follow-up.  Unfortunately she was recently hospitalized for a pleural effusion.  Subsequently after thoracentesis she was found to have pulmonary nodule with evidence for metastases.  She was diagnosed with adenocarcinoma.  There also appears to be some extension into the brain.  She is seeing Dr. Earlie Server for this.  She could not tolerate irbesartan but it may have been side effects related to this.  Nonetheless she is not been on any blood pressure medications and is  hypertensive today.  He continues to have issues with swelling is not responding to Lasix.  08/08/2020  Shelley Flores seen today for follow-up of hypertension.  After restarting her amlodipine blood pressure is much better controlled.  Today she is 127/66.  She did ask me to listen to her noted that she does have some dullness to percussion at the left base which may represent some persistent pleural effusion.  She does have follow-up with a CT scan in April 1 and sees the pulmonologist on April 4.  PMHx:  Past Medical History:  Diagnosis Date  . Bruit    Abdominal bruit - Abdominal aorta/renal duplex Doppler evaluation 12/07/03 -Mildly abnormal evaluation. 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 This is consistant w/median arcuate ligament compression syndrome. *Right & Left Kidney: Essentially equal in size, symmetrical in shape w/no significant abnormalities visualized. *Right & Left Renal Arteries: No significant abnormalities.  . Edema extremities    LE edema   . H/O myocardial perfusion scan 02/20/00   To rule out ischemia - Negative adequate Bruce protocol exercise stress test with a deconditioned exercise response and normal static myocardial perfusion images with EF calculated by QGS of 77%. Represents a low risk study.  . Hyperlipidemia   . Hypertension   . Osteoarthritis   . Osteoporosis   . Pleural effusion 04/2020  . Valvular heart disease    Mild valvular heart disease by Echo in 2010, all mild and symptomatic, including concentric LVH, MR, TR, and AI with pulmonary artery pressure of 32. EF was normal.    Past Surgical History:  Procedure Laterality Date  . Abdominal aorta/Renal duplex Doppler Evaluation  12/07/03   For abdominal bruit. Mildly abnormal evaluation. (See bruit in medical history)  . BREAST EXCISIONAL BIOPSY Left 1966  . BREAST EXCISIONAL BIOPSY Left 1999  . CATARACT EXTRACTION  2003  . CHEST TUBE INSERTION N/A 07/04/2020   Procedure: REMOVAL PLEURAL  DRAINAGE CATHETER;  Surgeon: Garner Nash, DO;  Location: [facility];  Service: Pulmonary;  Laterality: N/A;  . COLONOSCOPY  10/22/2004  . DEXA Bone Scan  06/23/2013  . IR PERC PLEURAL DRAIN W/INDWELL CATH W/IMG GUIDE  05/10/2020  . IR THORACENTESIS ASP PLEURAL SPACE W/IMG GUIDE  05/04/2020    FAMHx:  Family History  Problem Relation Age of Onset  . Leukemia Mother 25  . Cancer Father        esophagial cancer  . Heart failure Maternal Grandmother   . Tuberculosis Maternal Grandfather   . Heart disease Paternal Grandmother   . Cancer Paternal Grandfather     SOCHx:   reports that she quit smoking about 58 years ago. Her smoking use included cigarettes. She has a 1.50 pack-year smoking history. She has never used smokeless tobacco. She reports current alcohol use of about 2.0 standard drinks of alcohol per week. She reports that she does not use drugs.  ALLERGIES:  Allergies  Allergen Reactions  . Irbesartan Nausea Only  ROS: Pertinent items noted in HPI and remainder of comprehensive ROS otherwise negative.  HOME MEDS: Current Outpatient Medications  Medication Sig Dispense Refill  . acetaminophen (TYLENOL) 325 MG tablet Take 2 tablets (650 mg total) by mouth every 6 (six) hours as needed for mild pain (or Fever >/= 101). 20 tablet 0  . amLODipine (NORVASC) 2.5 MG tablet Take 1 tablet (2.5 mg total) by mouth daily. 30 tablet 3  . aspirin 81 MG tablet Take 81 mg by mouth daily.    [name] atorvastatin (LIPITOR) 20 MG tablet Take 1 tablet (20 mg total) by mouth at bedtime. 90 tablet 1  . furosemide (LASIX) 20 MG tablet Take 1 tablet (20 mg total) by mouth as needed for fluid or edema. 30 tablet 6  . metoprolol succinate (TOPROL-XL) 50 MG 24 hr tablet Take 50 mg by mouth daily. Take with or immediately following a meal.    . Multiple Vitamin (MULTIVITAMIN) tablet Take 1 tablet by mouth daily.    [name] osimertinib mesylate (TAGRISSO) 80 MG tablet Take 1 tablet (80 mg total) by mouth  daily. Days 1-28. 30 tablet 3  . spironolactone (ALDACTONE) 25 MG tablet Take 1 tablet (25 mg total) by mouth daily. 90 tablet 3  . zinc oxide 20 % ointment Apply 1 application topically as needed for irritation. apply to buttocks/peri, topical, As Needed, To buttocks after every incontinent episode and as needed for redness. May keep at bedside.     No current facility-administered medications for this visit.    LABS/IMAGING: No results found for this or any previous visit (from the past 48 hour(s)). No results found.  VITALS: BP 127/66   Pulse 73   Ht 5\' 4"  (1.626 m)   Wt 116 lb 9.6 oz (52.9 kg)   SpO2 98%   BMI 20.01 kg/m   EXAM: General appearance: alert and no distress Lungs: dullness to percussion LLL Psych: Pleasant  EKG: Deferred  ASSESSMENT: 1. New diagnosis of metastatic NSCLC/pleural effusion 2. Hypertension 3. Dyslipidemia 4. Asymptomatic PVC's - no significant recurrence 5. History of valvular heart disease.  6. Bilateral LE edema  PLAN: 1.   Mrs. Hudson has better blood pressure control on amlodipine.  She still has some persistent dullness to percussion and decreased breath sounds in the left base.  She has follow-up with pulmonary next week.  I would continue her current blood pressure medications.  We will plan follow-up in 6 months or sooner as necessary  Pixie Casino, MD, [facility], [place] Director of the [facility] Lipid Disorders &  Cardiovascular Risk Reduction Clinic Diplomate of the American Board of Clinical Lipidology Attending Cardiologist  Direct Dial: [phone]  Fax: [phone]  Website:  [email or web address] 08/08/2020, 11:32 AM

## 2020-08-08 NOTE — Patient Instructions (Signed)

## 2020-08-11 ENCOUNTER — Other Ambulatory Visit: Payer: Self-pay

## 2020-08-11 ENCOUNTER — Inpatient Hospital Stay: Payer: Medicare PPO | Attending: Physician Assistant

## 2020-08-11 ENCOUNTER — Encounter (HOSPITAL_COMMUNITY): Payer: Self-pay

## 2020-08-11 ENCOUNTER — Ambulatory Visit (HOSPITAL_COMMUNITY)
Admission: RE | Admit: 2020-08-11 | Discharge: 2020-08-11 | Disposition: A | Discharge status: Home / Self Care | Payer: Medicare PPO | Source: Ambulatory Visit | Attending: Internal Medicine | Admitting: Internal Medicine

## 2020-08-11 DIAGNOSIS — J9811 Atelectasis: Secondary | ICD-10-CM | POA: Diagnosis not present

## 2020-08-11 DIAGNOSIS — C7931 Secondary malignant neoplasm of brain: Secondary | ICD-10-CM | POA: Insufficient documentation

## 2020-08-11 DIAGNOSIS — J90 Pleural effusion, not elsewhere classified: Secondary | ICD-10-CM | POA: Diagnosis not present

## 2020-08-11 DIAGNOSIS — Z7982 Long term (current) use of aspirin: Secondary | ICD-10-CM | POA: Insufficient documentation

## 2020-08-11 DIAGNOSIS — C3412 Malignant neoplasm of upper lobe, left bronchus or lung: Secondary | ICD-10-CM | POA: Insufficient documentation

## 2020-08-11 DIAGNOSIS — C7951 Secondary malignant neoplasm of bone: Secondary | ICD-10-CM | POA: Diagnosis not present

## 2020-08-11 DIAGNOSIS — K7689 Other specified diseases of liver: Secondary | ICD-10-CM | POA: Diagnosis not present

## 2020-08-11 DIAGNOSIS — I10 Essential (primary) hypertension: Secondary | ICD-10-CM | POA: Insufficient documentation

## 2020-08-11 DIAGNOSIS — Z79899 Other long term (current) drug therapy: Secondary | ICD-10-CM | POA: Diagnosis not present

## 2020-08-11 DIAGNOSIS — C3490 Malignant neoplasm of unspecified part of unspecified bronchus or lung: Secondary | ICD-10-CM | POA: Insufficient documentation

## 2020-08-11 DIAGNOSIS — D492 Neoplasm of unspecified behavior of bone, soft tissue, and skin: Secondary | ICD-10-CM | POA: Diagnosis not present

## 2020-08-11 DIAGNOSIS — J929 Pleural plaque without asbestos: Secondary | ICD-10-CM | POA: Diagnosis not present

## 2020-08-11 DIAGNOSIS — C779 Secondary and unspecified malignant neoplasm of lymph node, unspecified: Secondary | ICD-10-CM | POA: Diagnosis not present

## 2020-08-11 DIAGNOSIS — K5730 Diverticulosis of large intestine without perforation or abscess without bleeding: Secondary | ICD-10-CM | POA: Diagnosis not present

## 2020-08-11 HISTORY — DX: Malignant (primary) neoplasm, unspecified: C80.1

## 2020-08-11 LAB — CBC WITH DIFFERENTIAL (CANCER CENTER ONLY)
Abs Immature Granulocytes: 0.02 10*3/uL (ref 0.00–0.07)
Basophils Absolute: 0 10*3/uL (ref 0.0–0.1)
Basophils Relative: 1 %
Eosinophils Absolute: 0.2 10*3/uL (ref 0.0–0.5)
Eosinophils Relative: 3 %
HCT: 38.1 % (ref 36.0–46.0)
Hemoglobin: 12.5 g/dL (ref 12.0–15.0)
Immature Granulocytes: 0 %
Lymphocytes Relative: 16 %
Lymphs Abs: 1.1 10*3/uL (ref 0.7–4.0)
MCH: 29.1 pg (ref 26.0–34.0)
MCHC: 32.8 g/dL (ref 30.0–36.0)
MCV: 88.6 fL (ref 80.0–100.0)
Monocytes Absolute: 0.7 10*3/uL (ref 0.1–1.0)
Monocytes Relative: 11 %
Neutro Abs: 4.8 10*3/uL (ref 1.7–7.7)
Neutrophils Relative %: 69 %
Platelet Count: 203 10*3/uL (ref 150–400)
RBC: 4.3 MIL/uL (ref 3.87–5.11)
RDW: 18.2 % — ABNORMAL HIGH (ref 11.5–15.5)
WBC Count: 6.9 10*3/uL (ref 4.0–10.5)
nRBC: 0 % (ref 0.0–0.2)

## 2020-08-11 LAB — CMP (CANCER CENTER ONLY)
ALT: 16 U/L (ref 0–44)
AST: 20 U/L (ref 15–41)
Albumin: 3.9 g/dL (ref 3.5–5.0)
Alkaline Phosphatase: 42 U/L (ref 38–126)
Anion gap: 10 (ref 5–15)
BUN: 28 mg/dL — ABNORMAL HIGH (ref 8–23)
CO2: 23 mmol/L (ref 22–32)
Calcium: 10.3 mg/dL (ref 8.9–10.3)
Chloride: 104 mmol/L (ref 98–111)
Creatinine: 1.06 mg/dL — ABNORMAL HIGH (ref 0.44–1.00)
GFR, Estimated: 51 mL/min — ABNORMAL LOW (ref >60)
Glucose, Bld: 97 mg/dL (ref 70–99)
Potassium: 4.3 mmol/L (ref 3.5–5.1)
Sodium: 137 mmol/L (ref 135–145)
Total Bilirubin: 0.6 mg/dL (ref 0.3–1.2)
Total Protein: 7.4 g/dL (ref 6.5–8.1)

## 2020-08-11 MED ORDER — IOHEXOL 300 MG/ML  SOLN
100.0000 mL | Freq: Once | INTRAMUSCULAR | Status: AC | PRN
  Administered 2020-08-11: 100 mL via INTRAVENOUS

## 2020-08-14 ENCOUNTER — Ambulatory Visit (HOSPITAL_COMMUNITY)
Admission: RE | Admit: 2020-08-14 | Discharge: 2020-08-14 | Disposition: A | Discharge status: Home / Self Care | Payer: Medicare PPO | Source: Ambulatory Visit | Attending: Internal Medicine | Admitting: Internal Medicine

## 2020-08-14 ENCOUNTER — Ambulatory Visit: Payer: Medicare PPO | Admitting: Pulmonary Disease

## 2020-08-14 ENCOUNTER — Other Ambulatory Visit: Payer: Self-pay

## 2020-08-14 DIAGNOSIS — G9389 Other specified disorders of brain: Secondary | ICD-10-CM | POA: Diagnosis not present

## 2020-08-14 DIAGNOSIS — C3490 Malignant neoplasm of unspecified part of unspecified bronchus or lung: Secondary | ICD-10-CM | POA: Diagnosis not present

## 2020-08-14 MED ORDER — GADOBUTROL 1 MMOL/ML IV SOLN
5.0000 mL | Freq: Once | INTRAVENOUS | Status: AC | PRN
  Administered 2020-08-14: 5 mL via INTRAVENOUS

## 2020-08-15 ENCOUNTER — Encounter: Payer: Self-pay | Admitting: Internal Medicine

## 2020-08-15 ENCOUNTER — Inpatient Hospital Stay: Payer: Medicare PPO | Admitting: Internal Medicine

## 2020-08-15 VITALS — BP 131/64 | HR 80 | Temp 97.6°F | Resp 15 | Ht 64.0 in | Wt 114.3 lb

## 2020-08-15 DIAGNOSIS — C3492 Malignant neoplasm of unspecified part of left bronchus or lung: Secondary | ICD-10-CM

## 2020-08-15 DIAGNOSIS — Z7982 Long term (current) use of aspirin: Secondary | ICD-10-CM | POA: Diagnosis not present

## 2020-08-15 DIAGNOSIS — Z79899 Other long term (current) drug therapy: Secondary | ICD-10-CM | POA: Diagnosis not present

## 2020-08-15 DIAGNOSIS — C779 Secondary and unspecified malignant neoplasm of lymph node, unspecified: Secondary | ICD-10-CM | POA: Diagnosis not present

## 2020-08-15 DIAGNOSIS — I10 Essential (primary) hypertension: Secondary | ICD-10-CM | POA: Diagnosis not present

## 2020-08-15 DIAGNOSIS — C7931 Secondary malignant neoplasm of brain: Secondary | ICD-10-CM | POA: Diagnosis not present

## 2020-08-15 DIAGNOSIS — C3412 Malignant neoplasm of upper lobe, left bronchus or lung: Secondary | ICD-10-CM | POA: Diagnosis not present

## 2020-08-15 NOTE — Progress Notes (Signed)
[place] [phone]) [phone]   [phone]  OFFICE PROGRESS NOTE  Shelley Dad, MD [place] [address]  DIAGNOSIS: Stage IV (T2b, N2, M1c), non-small cell lung cancer, adenocarcinoma.  The patient presented with a left upper lobe mass, right supra clavicular, left hilar and subcarinal lymphadenopathy as well as diffuse left-sided pleural disease with associated malignant left pleural effusion and 2 small lymph nodes near the SMA in addition to multiple brain metastasis diagnosed in December 2021.   Molecular studies by [facility] showed positive EGFR mutation with deletion in exon 19  PRIOR THERAPY: None  CURRENT THERAPY: Tagrisso 80 mg p.o. daily. First dose June 10, 2020.  Status post 2 months of treatment  INTERVAL HISTORY: Shelley Flores 87 y.o. female returns to the clinic today for follow-up visit.  The patient is feeling fine today with no concerning complaints except for mild fatigue.  She also has lack of appetite and lost few pounds since her last visit.  She denied having any current chest pain, shortness of breath, cough or hemoptysis.  She denied having any fever or chills.  She has no nausea, vomiting, diarrhea or constipation.  She has no headache or visual changes.  She had repeat CT scan of the chest, abdomen pelvis as well as MRI of the brain performed recently and she is here for evaluation and discussion of her scan results.  MEDICAL HISTORY: Past Medical History:  Diagnosis Date  . Bruit    Abdominal bruit - Abdominal aorta/renal duplex Doppler evaluation 12/07/03 -Mildly abnormal evaluation. 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 This is consistant w/median arcuate ligament compression syndrome. *Right & Left Kidney: Essentially equal in size, symmetrical in shape w/no significant abnormalities visualized. *Right & Left Renal Arteries: No significant abnormalities.  . Cancer ([place])   . Edema  extremities    LE edema   . H/O myocardial perfusion scan 02/20/00   To rule out ischemia - Negative adequate Bruce protocol exercise stress test with a deconditioned exercise response and normal static myocardial perfusion images with EF calculated by QGS of 77%. Represents a low risk study.  . Hyperlipidemia   . Hypertension   . Osteoarthritis   . Osteoporosis   . Pleural effusion 04/2020  . Valvular heart disease    Mild valvular heart disease by Echo in 2010, all mild and symptomatic, including concentric LVH, MR, TR, and AI with pulmonary artery pressure of 32. EF was normal.    ALLERGIES:  is allergic to irbesartan.  MEDICATIONS:  Current Outpatient Medications  Medication Sig Dispense Refill  . acetaminophen (TYLENOL) 325 MG tablet Take 2 tablets (650 mg total) by mouth every 6 (six) hours as needed for mild pain (or Fever >/= 101). 20 tablet 0  . amLODipine (NORVASC) 2.5 MG tablet Take 1 tablet (2.5 mg total) by mouth daily. 30 tablet 3  . aspirin 81 MG tablet Take 81 mg by mouth daily.    [name] atorvastatin (LIPITOR) 20 MG tablet Take 1 tablet (20 mg total) by mouth at bedtime. 90 tablet 1  . furosemide (LASIX) 20 MG tablet Take 1 tablet (20 mg total) by mouth as needed for fluid or edema. 30 tablet 6  . metoprolol succinate (TOPROL-XL) 50 MG 24 hr tablet Take 50 mg by mouth daily. Take with or immediately following a meal.    . Multiple Vitamin (MULTIVITAMIN) tablet Take 1 tablet by mouth daily.    [name]  osimertinib mesylate (TAGRISSO) 80 MG tablet Take 1 tablet (80 mg total) by mouth daily. Days 1-28. 30 tablet 3  . spironolactone (ALDACTONE) 25 MG tablet Take 1 tablet (25 mg total) by mouth daily. 90 tablet 3  . zinc oxide 20 % ointment Apply 1 application topically as needed for irritation. apply to buttocks/peri, topical, As Needed, To buttocks after every incontinent episode and as needed for redness. May keep at bedside.     No current facility-administered medications for this  visit.    SURGICAL HISTORY:  Past Surgical History:  Procedure Laterality Date  . Abdominal aorta/Renal duplex Doppler Evaluation  12/07/03   For abdominal bruit. Mildly abnormal evaluation. (See bruit in medical history)  . BREAST EXCISIONAL BIOPSY Left 1966  . BREAST EXCISIONAL BIOPSY Left 1999  . CATARACT EXTRACTION  2003  . CHEST TUBE INSERTION N/A 07/04/2020   Procedure: REMOVAL PLEURAL DRAINAGE CATHETER;  Surgeon: Garner Nash, DO;  Location: [facility];  Service: Pulmonary;  Laterality: N/A;  . COLONOSCOPY  10/22/2004  . DEXA Bone Scan  06/23/2013  . IR PERC PLEURAL DRAIN W/INDWELL CATH W/IMG GUIDE  05/10/2020  . IR THORACENTESIS ASP PLEURAL SPACE W/IMG GUIDE  05/04/2020    REVIEW OF SYSTEMS:  Constitutional: positive for fatigue Eyes: negative Ears, nose, mouth, throat, and face: negative Respiratory: negative Cardiovascular: negative Gastrointestinal: negative Genitourinary:negative Integument/breast: negative Hematologic/lymphatic: negative Musculoskeletal:negative Neurological: negative Behavioral/Psych: negative Endocrine: negative Allergic/Immunologic: negative   PHYSICAL EXAMINATION: General appearance: alert, cooperative, fatigued and no distress Head: Normocephalic, without obvious abnormality, atraumatic Neck: no adenopathy, no JVD, supple, symmetrical, trachea midline and thyroid not enlarged, symmetric, no tenderness/mass/nodules Lymph nodes: Cervical, supraclavicular, and axillary nodes normal. Resp: clear to auscultation bilaterally Back: symmetric, no curvature. ROM normal. No CVA tenderness. Cardio: regular rate and rhythm, S1, S2 normal, no murmur, click, rub or gallop GI: soft, non-tender; bowel sounds normal; no masses,  no organomegaly Extremities: extremities normal, atraumatic, no cyanosis or edema Neurologic: Alert and oriented X 3, normal strength and tone. Normal symmetric reflexes. Normal coordination and gait  ECOG PERFORMANCE STATUS: 1  - Symptomatic but completely ambulatory  Blood pressure 131/64, pulse 80, temperature 97.6 F (36.4 C), temperature source Tympanic, resp. rate 15, height '5\' 4"'  (1.626 m), weight 114 lb 4.8 oz (51.8 kg), SpO2 100 %.  LABORATORY DATA: Lab Results  Component Value Date   WBC 6.9 08/11/2020   HGB 12.5 08/11/2020   HCT 38.1 08/11/2020   MCV 88.6 08/11/2020   PLT 203 08/11/2020      Chemistry      Component Value Date/Time   NA 137 08/11/2020 1008   NA 137 05/25/2020 0000   K 4.3 08/11/2020 1008   CL 104 08/11/2020 1008   CO2 23 08/11/2020 1008   BUN 28 (H) 08/11/2020 1008   BUN 12 05/25/2020 0000   CREATININE 1.06 (H) 08/11/2020 1008   GLU 83 05/25/2020 0000      Component Value Date/Time   CALCIUM 10.3 08/11/2020 1008   ALKPHOS 42 08/11/2020 1008   AST 20 08/11/2020 1008   ALT 16 08/11/2020 1008   BILITOT 0.6 08/11/2020 1008       RADIOGRAPHIC STUDIES: CT Chest W Contrast  Result Date: 08/11/2020 CLINICAL DATA:  Primary Cancer Type: Lung Imaging Indication: Assess response to therapy Interval therapy since last imaging? Yes Initial Cancer Diagnosis Date: 05/01/2020; Established by: Biopsy-proven Detailed Pathology: Stage IV non-small cell lung cancer, adenocarcinoma. Primary Tumor location: Left upper lobe.  Multiple brain metastases.  Surgeries: No. Chemotherapy: Yes; Ongoing? Yes; Most recent administration: Tagrisso daily Immunotherapy? No Radiation therapy? No EXAM: CT CHEST, ABDOMEN, AND PELVIS WITH CONTRAST TECHNIQUE: Multidetector CT imaging of the chest, abdomen and pelvis was performed following the standard protocol during bolus administration of intravenous contrast. CONTRAST:  183m OMNIPAQUE IOHEXOL 300 MG/ML  SOLN COMPARISON:  05/31/2020 PET-CT. CT abdomen and pelvis 05/05/2020. CT chest 05/04/2020. FINDINGS: CT CHEST FINDINGS Cardiovascular: Normal heart size. No significant pericardial effusion/thickening. Mildly atherosclerotic nonaneurysmal thoracic aorta. Normal  caliber pulmonary arteries. No central pulmonary emboli. Mediastinum/Nodes: Subcentimeter hypodense right thyroid nodule. Not clinically significant; no follow-up imaging recommended (ref: J Am Coll Radiol. 2015 Feb;12(2): 143-50). Unremarkable esophagus. No pathologically enlarged axillary, mediastinal or hilar lymph nodes. Lungs/Pleura: No pneumothorax. Small sub pulmonic left pleural effusion, stable. Stable smooth left pleural thickening. Resolved right pleural effusion. Anteromedial left upper lobe solid 2.9 x 2.2 cm pulmonary nodule (series 4/image 70), significantly decreased from 5.6 x 5.0 cm on 05/04/2020 chest CT using similar measurement technique. No acute consolidative airspace disease. Two tiny solid right lower lobe pulmonary nodules, largest 0.3 cm (series 4/image 101), previously obscured by atelectasis. No additional significant pulmonary nodules. Musculoskeletal: New sclerotic 0.7 cm T5 vertebral lesion (series 6/image 93). No additional focal osseous lesions in the chest. CT ABDOMEN PELVIS FINDINGS Hepatobiliary: Normal liver size. A few scattered subcentimeter hypodense liver lesions are too small to characterize and are unchanged. No new liver lesions. Contracted and otherwise normal gallbladder with no radiopaque cholelithiasis. No biliary ductal dilatation. Pancreas: Low-attenuation 1.0 cm pancreatic body lesion (series 2/image 62), stable. No new pancreatic lesions. No pancreatic duct dilation. Spleen: Normal size spleen. A few scattered subcentimeter hypodense splenic lesions are too small to characterize and not appreciably changed, presumably benign. No new splenic lesions. Adrenals/Urinary Tract: Right adrenal 1.1 cm nodule with density 113 HU, stable, previously non hypermetabolic, presumably benign. No discrete left adrenal nodules. No hydronephrosis. Simple bilateral renal cysts, largest 6.3 cm in the upper left kidney. Normal nondistended bladder. Stomach/Bowel: Normal non-distended  stomach. Normal caliber small bowel with no small bowel wall thickening. Appendix not discretely visualized. Minimal sigmoid diverticulosis with no large bowel wall thickening or significant pericolonic fat stranding. Vascular/Lymphatic: Atherosclerotic nonaneurysmal abdominal aorta. Patent portal, splenic, hepatic and renal veins. No pathologically enlarged lymph nodes in the abdomen or pelvis. Reproductive: Status post hysterectomy, with no abnormal findings at the vaginal cuff. No adnexal mass. Stable coarse 1 cm left adnexal calcification, presumably dystrophic. Other: No pneumoperitoneum, ascites or focal fluid collection. Musculoskeletal: Newly apparent sclerotic 0.7 cm medial left iliac bone lesion (series 2/image 86). Newly apparent 0.6 cm L3 vertebral sclerotic lesion (series 6/image 94). Marked lumbar spondylosis. IMPRESSION: 1. Medial left upper lobe pulmonary neoplasm is significantly decreased. 2. Resolved right pleural effusion. Stable small subpulmonic left pleural effusion. Stable smooth left pleural thickening. 3. Two tiny solid right lower lobe pulmonary nodules, largest 0.3 cm, previously obscured by atelectasis. Close chest CT surveillance recommended. 4. Newly apparent subcentimeter sclerotic osseous lesions in the T5 and L3 vertebral bodies and left iliac bone, compatible with osseous metastases, equivocal for either treatment response from previously occult lesions or new lesions. 5. Stable small low-attenuation pancreatic body lesion without pancreatic duct dilation. If clinically warranted, a follow-up MRI abdomen without and with IV contrast could be obtained in 6 months. 6. Minimal sigmoid diverticulosis. 7. Aortic Atherosclerosis (ICD10-I70.0). Electronically Signed   By: JIlona SorrelM.D.   On: 08/11/2020 11:59   MR Brain W Wo Contrast  Result Date: 08/14/2020 CLINICAL DATA:  Non-small cell lung cancer. EXAM: MRI HEAD WITHOUT AND WITH CONTRAST TECHNIQUE: Multiplanar, multiecho pulse  sequences of the brain and surrounding structures were obtained without and with intravenous contrast. CONTRAST:  90m GADAVIST GADOBUTROL 1 MMOL/ML IV SOLN COMPARISON:  06/07/2020. FINDINGS: Brain: Similar size of an extra-axial dural-based lesion along the right frontal parietal convexity, measuring up to 1.5 cm (series 16, image 132). Additional lesions described on the prior have decreased in size/conspicuity, including: * Decreased (now minimal) enhancement in the region of the previously described lesion in the right posterior temporal operculum (series 16, image 90 and series 18, image 3) with faint susceptibility artifact in this region. * No appreciable enhancement in the region of the previously described left parietal lesionswith possible faint susceptibility artifact in the region of the more inferior lesion. * No appreciable enhancement in the region of the previous described right cerebellar lesion No new enhancing lesions identified. No acute infarct. No evidence of acute hemorrhage. No midline shift. No extra-axial fluid collections. No hydrocephalus. Mild scattered T2/FLAIR hyperintensities within the white matter, nonspecific but most likely related to chronic microvascular ischemic disease. Partially empty sella. Vascular: Major arterial flow voids are maintained at the skull base. Skull and upper cervical spine: Normal marrow signal. Sinuses/Orbits: Sinuses are clear. Unremarkable orbits. Other: No mastoid effusions. IMPRESSION: 1. Interval response to treatment with decreased size/conspicuity of the previously described enhancing lesions that were compatible with metastastic disease, as detailed above. 2. Additional extradural enhancing 1.5 cm lesion along the right frontoparietal convexity is unchanged and therefore favored to represent a meningioma. 3. No new enhancing lesions identified. Electronically Signed   By: FMargaretha SheffieldMD   On: 08/14/2020 14:40   CT Abdomen Pelvis W  Contrast  Result Date: 08/11/2020 CLINICAL DATA:  Primary Cancer Type: Lung Imaging Indication: Assess response to therapy Interval therapy since last imaging? Yes Initial Cancer Diagnosis Date: 05/01/2020; Established by: Biopsy-proven Detailed Pathology: Stage IV non-small cell lung cancer, adenocarcinoma. Primary Tumor location: Left upper lobe.  Multiple brain metastases. Surgeries: No. Chemotherapy: Yes; Ongoing? Yes; Most recent administration: Tagrisso daily Immunotherapy? No Radiation therapy? No EXAM: CT CHEST, ABDOMEN, AND PELVIS WITH CONTRAST TECHNIQUE: Multidetector CT imaging of the chest, abdomen and pelvis was performed following the standard protocol during bolus administration of intravenous contrast. CONTRAST:  1032mOMNIPAQUE IOHEXOL 300 MG/ML  SOLN COMPARISON:  05/31/2020 PET-CT. CT abdomen and pelvis 05/05/2020. CT chest 05/04/2020. FINDINGS: CT CHEST FINDINGS Cardiovascular: Normal heart size. No significant pericardial effusion/thickening. Mildly atherosclerotic nonaneurysmal thoracic aorta. Normal caliber pulmonary arteries. No central pulmonary emboli. Mediastinum/Nodes: Subcentimeter hypodense right thyroid nodule. Not clinically significant; no follow-up imaging recommended (ref: J Am Coll Radiol. 2015 Feb;12(2): 143-50). Unremarkable esophagus. No pathologically enlarged axillary, mediastinal or hilar lymph nodes. Lungs/Pleura: No pneumothorax. Small sub pulmonic left pleural effusion, stable. Stable smooth left pleural thickening. Resolved right pleural effusion. Anteromedial left upper lobe solid 2.9 x 2.2 cm pulmonary nodule (series 4/image 70), significantly decreased from 5.6 x 5.0 cm on 05/04/2020 chest CT using similar measurement technique. No acute consolidative airspace disease. Two tiny solid right lower lobe pulmonary nodules, largest 0.3 cm (series 4/image 101), previously obscured by atelectasis. No additional significant pulmonary nodules. Musculoskeletal: New sclerotic  0.7 cm T5 vertebral lesion (series 6/image 93). No additional focal osseous lesions in the chest. CT ABDOMEN PELVIS FINDINGS Hepatobiliary: Normal liver size. A few scattered subcentimeter hypodense liver lesions are too small to characterize and are unchanged. No new liver lesions.  Contracted and otherwise normal gallbladder with no radiopaque cholelithiasis. No biliary ductal dilatation. Pancreas: Low-attenuation 1.0 cm pancreatic body lesion (series 2/image 62), stable. No new pancreatic lesions. No pancreatic duct dilation. Spleen: Normal size spleen. A few scattered subcentimeter hypodense splenic lesions are too small to characterize and not appreciably changed, presumably benign. No new splenic lesions. Adrenals/Urinary Tract: Right adrenal 1.1 cm nodule with density 113 HU, stable, previously non hypermetabolic, presumably benign. No discrete left adrenal nodules. No hydronephrosis. Simple bilateral renal cysts, largest 6.3 cm in the upper left kidney. Normal nondistended bladder. Stomach/Bowel: Normal non-distended stomach. Normal caliber small bowel with no small bowel wall thickening. Appendix not discretely visualized. Minimal sigmoid diverticulosis with no large bowel wall thickening or significant pericolonic fat stranding. Vascular/Lymphatic: Atherosclerotic nonaneurysmal abdominal aorta. Patent portal, splenic, hepatic and renal veins. No pathologically enlarged lymph nodes in the abdomen or pelvis. Reproductive: Status post hysterectomy, with no abnormal findings at the vaginal cuff. No adnexal mass. Stable coarse 1 cm left adnexal calcification, presumably dystrophic. Other: No pneumoperitoneum, ascites or focal fluid collection. Musculoskeletal: Newly apparent sclerotic 0.7 cm medial left iliac bone lesion (series 2/image 86). Newly apparent 0.6 cm L3 vertebral sclerotic lesion (series 6/image 94). Marked lumbar spondylosis. IMPRESSION: 1. Medial left upper lobe pulmonary neoplasm is significantly  decreased. 2. Resolved right pleural effusion. Stable small subpulmonic left pleural effusion. Stable smooth left pleural thickening. 3. Two tiny solid right lower lobe pulmonary nodules, largest 0.3 cm, previously obscured by atelectasis. Close chest CT surveillance recommended. 4. Newly apparent subcentimeter sclerotic osseous lesions in the T5 and L3 vertebral bodies and left iliac bone, compatible with osseous metastases, equivocal for either treatment response from previously occult lesions or new lesions. 5. Stable small low-attenuation pancreatic body lesion without pancreatic duct dilation. If clinically warranted, a follow-up MRI abdomen without and with IV contrast could be obtained in 6 months. 6. Minimal sigmoid diverticulosis. 7. Aortic Atherosclerosis (ICD10-I70.0). Electronically Signed   By: Ilona Sorrel M.D.   On: 08/11/2020 11:59    ASSESSMENT AND PLAN: This is a very pleasant 87 years old white female recently diagnosed with a stage IV (T3, N3, M1 C) non-small cell lung cancer, adenocarcinoma with positive EGFR mutation with deletion in exon 19 diagnosed in December 2021 and presented with left lower lobe lung mass in addition to right supraclavicular, left hilar and subcarinal lymphadenopathy as well as metastatic lymphadenopathy in the abdomen and left pleural-based metastasis as well as malignant left pleural effusion and multiple brain metastasis. The molecular study showed positive EGFR mutation with deletion in exon 19. The patient started treatment with Tagrisso on June 10, 2020.  Status post 2 months of treatment. The patient has been tolerating this treatment well with no concerning adverse effects. She had repeat CT scan of the chest, abdomen pelvis as well as MRI of the brain performed recently.  I personally and independently reviewed the scan images and discussed the result and showed the images to the patient today. Her scan showed significant improvement of her disease  especially in the chest. I recommended for her to continue her current treatment with Tagrisso with the same dose. I will see her back for follow-up visit in 1 months for evaluation with repeat blood work. She was advised to call immediately if she has any concerning symptoms in the interval. The patient voices understanding of current disease status and treatment options and is in agreement with the current care plan.  All questions were answered. The patient knows  to call the clinic with any problems, questions or concerns. We can certainly see the patient much sooner if necessary.   Disclaimer: This note was dictated with voice recognition software. Similar sounding words can inadvertently be transcribed and may not be corrected upon review.

## 2020-08-25 ENCOUNTER — Encounter: Payer: Medicare PPO | Admitting: Dietician

## 2020-08-25 ENCOUNTER — Telehealth: Payer: Self-pay | Admitting: Dietician

## 2020-08-25 NOTE — Telephone Encounter (Signed)
Nutrition Assessment   Reason for Assessment: MST (poor appetite, weight loss)   ASSESSMENT: 87 year old female with stage IV NSCLC metastatic to brain. She is receiving oral chemotherapy with Tagrisso. Patient followed by Dr. Julien Nordmann.  Past medical history of abdominal bruit, LE edema, HLD, HTN, osteoarthritis, valvular heart disease  Spoke with patient via phone. Introduced self as well as nutrition services offered at [employer]. Patient reports poor appetite and eating very little. She reports "not a great eater" prior to diagnosis, but states "everything taste bad" and all of the foods she previously liked no longer appeal to her. Patient resides at [facility] and is provided a meal daily. She will sometimes eat the chicken, but usually does not like anything brought. This morning she has eaten 5-7 spoonfuls of cereal and drank 1/2 glass of water. Patient is drinking a few sips of Boost mixed with milk daily. She is brushing teeth once/day, reports [employer] toothpaste taste horrible. Patient reports she is losing weight and needs to eat, but is "not going to if it all taste bad"   Medications: Lasix, MVI   Labs: 4/1: BUN 28, Cr 1.06   Anthropometrics: Weight 114 ln 4.8 oz on 4/5 down 2% in 7 days and decreased 12% from usual weight over the last 3 months; significant.  Height: 5'4" Weight: 51.8 kg  UBW: 125-130 lb (Jan-Feb) BMI: 19.62   NUTRITION DIAGNOSIS: Inadequate oral intake related to chemotherapy side effects as evidenced by poor appetite, altered taste, and 12% weight loss in 2 months; significant  Suspect patient likely meets criteria for malnutrition, however unable to identify without performing NFPE   INTERVENTION:  Educated on the importance of nutrition Encouraged small "mini" meals every 2-3 hours, using alarm for reminders Discussed strategies for altered taste, handout mailed Educated on oral care, encouraged baking soda/salt water rinse before meals, recipe  mailed Discussed strategies for increasing calories and protein, handout mailed Educated on alternate supplements and encouraged patient to try different varieties  Recommended 3-4 nutrient dense supplements daily, Ensure coupons mailed RD contact information provided   MONITORING, EVALUATION, GOAL:  Patient will tolerate increased calories and protein to minimize weight loss   Next Visit: Tuesday May 3 in clinic

## 2020-08-29 ENCOUNTER — Other Ambulatory Visit (HOSPITAL_COMMUNITY): Payer: Self-pay

## 2020-08-29 MED FILL — Osimertinib Mesylate Tab 80 MG (Base Equivalent): ORAL | 30 days supply | Qty: 30 | Fill #0 | Status: AC

## 2020-08-30 ENCOUNTER — Other Ambulatory Visit (HOSPITAL_COMMUNITY): Payer: Self-pay

## 2020-09-12 ENCOUNTER — Encounter: Payer: Self-pay | Admitting: Internal Medicine

## 2020-09-12 ENCOUNTER — Inpatient Hospital Stay: Payer: Medicare PPO | Attending: Physician Assistant

## 2020-09-12 ENCOUNTER — Other Ambulatory Visit: Payer: Self-pay

## 2020-09-12 ENCOUNTER — Inpatient Hospital Stay: Payer: Medicare PPO | Admitting: Dietician

## 2020-09-12 ENCOUNTER — Inpatient Hospital Stay: Payer: Medicare PPO | Admitting: Internal Medicine

## 2020-09-12 VITALS — BP 126/67 | HR 76 | Temp 97.8°F | Resp 18 | Ht 64.0 in | Wt 110.4 lb

## 2020-09-12 DIAGNOSIS — Z79899 Other long term (current) drug therapy: Secondary | ICD-10-CM | POA: Insufficient documentation

## 2020-09-12 DIAGNOSIS — C3492 Malignant neoplasm of unspecified part of left bronchus or lung: Secondary | ICD-10-CM

## 2020-09-12 DIAGNOSIS — C3412 Malignant neoplasm of upper lobe, left bronchus or lung: Secondary | ICD-10-CM | POA: Insufficient documentation

## 2020-09-12 DIAGNOSIS — Z7982 Long term (current) use of aspirin: Secondary | ICD-10-CM | POA: Insufficient documentation

## 2020-09-12 DIAGNOSIS — C7931 Secondary malignant neoplasm of brain: Secondary | ICD-10-CM | POA: Insufficient documentation

## 2020-09-12 DIAGNOSIS — I10 Essential (primary) hypertension: Secondary | ICD-10-CM | POA: Diagnosis not present

## 2020-09-12 LAB — CBC WITH DIFFERENTIAL (CANCER CENTER ONLY)
Abs Immature Granulocytes: 0.02 10*3/uL (ref 0.00–0.07)
Basophils Absolute: 0 10*3/uL (ref 0.0–0.1)
Basophils Relative: 0 %
Eosinophils Absolute: 0.1 10*3/uL (ref 0.0–0.5)
Eosinophils Relative: 2 %
HCT: 36.4 % (ref 36.0–46.0)
Hemoglobin: 12.1 g/dL (ref 12.0–15.0)
Immature Granulocytes: 0 %
Lymphocytes Relative: 14 %
Lymphs Abs: 0.9 10*3/uL (ref 0.7–4.0)
MCH: 29.8 pg (ref 26.0–34.0)
MCHC: 33.2 g/dL (ref 30.0–36.0)
MCV: 89.7 fL (ref 80.0–100.0)
Monocytes Absolute: 0.7 10*3/uL (ref 0.1–1.0)
Monocytes Relative: 10 %
Neutro Abs: 4.9 10*3/uL (ref 1.7–7.7)
Neutrophils Relative %: 74 %
Platelet Count: 185 10*3/uL (ref 150–400)
RBC: 4.06 MIL/uL (ref 3.87–5.11)
RDW: 18.9 % — ABNORMAL HIGH (ref 11.5–15.5)
WBC Count: 6.7 10*3/uL (ref 4.0–10.5)
nRBC: 0 % (ref 0.0–0.2)

## 2020-09-12 LAB — CMP (CANCER CENTER ONLY)
ALT: 11 U/L (ref 0–44)
AST: 19 U/L (ref 15–41)
Albumin: 4 g/dL (ref 3.5–5.0)
Alkaline Phosphatase: 40 U/L (ref 38–126)
Anion gap: 7 (ref 5–15)
BUN: 33 mg/dL — ABNORMAL HIGH (ref 8–23)
CO2: 27 mmol/L (ref 22–32)
Calcium: 10.7 mg/dL — ABNORMAL HIGH (ref 8.9–10.3)
Chloride: 106 mmol/L (ref 98–111)
Creatinine: 1.25 mg/dL — ABNORMAL HIGH (ref 0.44–1.00)
GFR, Estimated: 42 mL/min — ABNORMAL LOW (ref >60)
Glucose, Bld: 101 mg/dL — ABNORMAL HIGH (ref 70–99)
Potassium: 4.3 mmol/L (ref 3.5–5.1)
Sodium: 140 mmol/L (ref 135–145)
Total Bilirubin: 0.5 mg/dL (ref 0.3–1.2)
Total Protein: 7.2 g/dL (ref 6.5–8.1)

## 2020-09-12 NOTE — Progress Notes (Signed)
[facility] [phone]   Fax:([phone]  OFFICE PROGRESS NOTE  [name], MD [place] [address]  DIAGNOSIS: Stage IV (T2b, N2, M1c), non-small cell lung cancer, adenocarcinoma.  The patient presented with a left upper lobe mass, right supra clavicular, left hilar and subcarinal lymphadenopathy as well as diffuse left-sided pleural disease with associated malignant left pleural effusion and 2 small lymph nodes near the SMA in addition to multiple brain metastasis diagnosed in December 2021.   Molecular studies by [employer] showed positive EGFR mutation with deletion in exon 19  PRIOR THERAPY: None  CURRENT THERAPY: Tagrisso 80 mg p.o. daily. First dose June 10, 2020.  Status post 3 months of treatment  INTERVAL HISTORY: Shelley Flores 87 y.o. female returns to the clinic today for follow-up visit.  The patient is feeling fine today with no concerning complaints except for the lack of appetite and nothing tastes good to her.  She also has 1 episodes of diarrhea every day but she does not take any Imodium or other antidiarrheal medication.  She denied having any current shortness of breath, cough or hemoptysis but has intermittent pain on the left side of the chest.  She lost few pounds since her last visit.  She has been tolerating her treatment with Tagrisso fairly well.  She is here today for evaluation and repeat blood work.   MEDICAL HISTORY: Past Medical History:  Diagnosis Date  . Bruit    Abdominal bruit - Abdominal aorta/renal duplex Doppler evaluation 12/07/03 -Mildly abnormal evaluation. 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 This is consistant w/median arcuate ligament compression syndrome. *Right & Left Kidney: Essentially equal in size, symmetrical in shape w/no significant abnormalities visualized. *Right & Left Renal Arteries: No significant abnormalities.  . Cancer ([place])   . Edema  extremities    LE edema   . H/O myocardial perfusion scan 02/20/00   To rule out ischemia - Negative adequate Bruce protocol exercise stress test with a deconditioned exercise response and normal static myocardial perfusion images with EF calculated by QGS of 77%. Represents a low risk study.  . Hyperlipidemia   . Hypertension   . Osteoarthritis   . Osteoporosis   . Pleural effusion 04/2020  . Valvular heart disease    Mild valvular heart disease by Echo in 2010, all mild and symptomatic, including concentric LVH, MR, TR, and AI with pulmonary artery pressure of 32. EF was normal.    ALLERGIES:  is allergic to irbesartan.  MEDICATIONS:  Current Outpatient Medications  Medication Sig Dispense Refill  . acetaminophen (TYLENOL) 325 MG tablet Take 2 tablets (650 mg total) by mouth every 6 (six) hours as needed for mild pain (or Fever >/= 101). 20 tablet 0  . amLODipine (NORVASC) 2.5 MG tablet Take 1 tablet (2.5 mg total) by mouth daily. 30 tablet 3  . aspirin 81 MG tablet Take 81 mg by mouth daily.    [name] atorvastatin (LIPITOR) 20 MG tablet Take 1 tablet (20 mg total) by mouth at bedtime. 90 tablet 1  . furosemide (LASIX) 20 MG tablet Take 1 tablet (20 mg total) by mouth as needed for fluid or edema. 30 tablet 6  . metoprolol succinate (TOPROL-XL) 50 MG 24 hr tablet Take 50 mg by mouth daily. Take with or immediately following a meal.    . Multiple Vitamin (MULTIVITAMIN) tablet Take 1 tablet by mouth daily.    [name] osimertinib  mesylate (TAGRISSO) 80 MG tablet Take 1 tablet (80 mg total) by mouth daily. Days 1-28. 30 tablet 3  . osimertinib mesylate (TAGRISSO) 80 MG tablet TAKE 1 TABLET BY MOUTH DAILY. DAYS 1-28. 30 tablet 3  . spironolactone (ALDACTONE) 25 MG tablet Take 1 tablet (25 mg total) by mouth daily. 90 tablet 3  . zinc oxide 20 % ointment Apply 1 application topically as needed for irritation. apply to buttocks/peri, topical, As Needed, To buttocks after every incontinent episode and as  needed for redness. May keep at bedside.     No current facility-administered medications for this visit.    SURGICAL HISTORY:  Past Surgical History:  Procedure Laterality Date  . Abdominal aorta/Renal duplex Doppler Evaluation  12/07/03   For abdominal bruit. Mildly abnormal evaluation. (See bruit in medical history)  . BREAST EXCISIONAL BIOPSY Left 1966  . BREAST EXCISIONAL BIOPSY Left 1999  . CATARACT EXTRACTION  2003  . CHEST TUBE INSERTION N/A 07/04/2020   Procedure: REMOVAL PLEURAL DRAINAGE CATHETER;  Surgeon: Garner Nash, DO;  Location: [facility];  Service: Pulmonary;  Laterality: N/A;  . COLONOSCOPY  10/22/2004  . DEXA Bone Scan  06/23/2013  . IR PERC PLEURAL DRAIN W/INDWELL CATH W/IMG GUIDE  05/10/2020  . IR THORACENTESIS ASP PLEURAL SPACE W/IMG GUIDE  05/04/2020    REVIEW OF SYSTEMS:  A comprehensive review of systems was negative except for: Constitutional: positive for anorexia and weight loss Respiratory: positive for pleurisy/chest pain Gastrointestinal: positive for diarrhea   PHYSICAL EXAMINATION: General appearance: alert, cooperative, fatigued and no distress Head: Normocephalic, without obvious abnormality, atraumatic Neck: no adenopathy, no JVD, supple, symmetrical, trachea midline and thyroid not enlarged, symmetric, no tenderness/mass/nodules Lymph nodes: Cervical, supraclavicular, and axillary nodes normal. Resp: clear to auscultation bilaterally Back: symmetric, no curvature. ROM normal. No CVA tenderness. Cardio: regular rate and rhythm, S1, S2 normal, no murmur, click, rub or gallop GI: soft, non-tender; bowel sounds normal; no masses,  no organomegaly Extremities: extremities normal, atraumatic, no cyanosis or edema  ECOG PERFORMANCE STATUS: 1 - Symptomatic but completely ambulatory  Blood pressure 126/67, pulse 76, temperature 97.8 F (36.6 C), temperature source Tympanic, resp. rate 18, height _0  (1.626 m), weight 110 lb 6.4 oz (50.1 kg),  SpO2 100 %.  LABORATORY DATA: Lab Results  Component Value Date   WBC 6.7 09/12/2020   HGB 12.1 09/12/2020   HCT 36.4 09/12/2020   MCV 89.7 09/12/2020   PLT 185 09/12/2020      Chemistry      Component Value Date/Time   NA 140 09/12/2020 1039   NA 137 05/25/2020 0000   K 4.3 09/12/2020 1039   CL 106 09/12/2020 1039   CO2 27 09/12/2020 1039   BUN 33 (H) 09/12/2020 1039   BUN 12 05/25/2020 0000   CREATININE 1.25 (H) 09/12/2020 1039   GLU 83 05/25/2020 0000      Component Value Date/Time   CALCIUM 10.7 (H) 09/12/2020 1039   ALKPHOS 40 09/12/2020 1039   AST 19 09/12/2020 1039   ALT 11 09/12/2020 1039   BILITOT 0.5 09/12/2020 1039       RADIOGRAPHIC STUDIES: MR Brain W Wo Contrast  Result Date: 08/14/2020 CLINICAL DATA:  Non-small cell lung cancer. EXAM: MRI HEAD WITHOUT AND WITH CONTRAST TECHNIQUE: Multiplanar, multiecho pulse sequences of the brain and surrounding structures were obtained without and with intravenous contrast. CONTRAST:  54m GADAVIST GADOBUTROL 1 MMOL/ML IV SOLN COMPARISON:  06/07/2020. FINDINGS: Brain: Similar size of an extra-axial  dural-based lesion along the right frontal parietal convexity, measuring up to 1.5 cm (series 16, image 132). Additional lesions described on the prior have decreased in size/conspicuity, including: * Decreased (now minimal) enhancement in the region of the previously described lesion in the right posterior temporal operculum (series 16, image 90 and series 18, image 3) with faint susceptibility artifact in this region. * No appreciable enhancement in the region of the previously described left parietal lesionswith possible faint susceptibility artifact in the region of the more inferior lesion. * No appreciable enhancement in the region of the previous described right cerebellar lesion No new enhancing lesions identified. No acute infarct. No evidence of acute hemorrhage. No midline shift. No extra-axial fluid collections. No  hydrocephalus. Mild scattered T2/FLAIR hyperintensities within the white matter, nonspecific but most likely related to chronic microvascular ischemic disease. Partially empty sella. Vascular: Major arterial flow voids are maintained at the skull base. Skull and upper cervical spine: Normal marrow signal. Sinuses/Orbits: Sinuses are clear. Unremarkable orbits. Other: No mastoid effusions. IMPRESSION: 1. Interval response to treatment with decreased size/conspicuity of the previously described enhancing lesions that were compatible with metastastic disease, as detailed above. 2. Additional extradural enhancing 1.5 cm lesion along the right frontoparietal convexity is unchanged and therefore favored to represent a meningioma. 3. No new enhancing lesions identified. Electronically Signed   By: Margaretha Sheffield MD   On: 08/14/2020 14:40    ASSESSMENT AND PLAN: This is a very pleasant 87 years old white female recently diagnosed with a stage IV (T3, N3, M1 C) non-small cell lung cancer, adenocarcinoma with positive EGFR mutation with deletion in exon 27 diagnosed in December 2021 and presented with left lower lobe lung mass in addition to right supraclavicular, left hilar and subcarinal lymphadenopathy as well as metastatic lymphadenopathy in the abdomen and left pleural-based metastasis as well as malignant left pleural effusion and multiple brain metastasis. The molecular study showed positive EGFR mutation with deletion in exon 19. The patient started treatment with Tagrisso on June 10, 2020.  Status post 3 months of treatment. The patient has been tolerating her treatment well except recently she started having 1 episode of diarrhea every day. I recommended for her to continue her treatment with Tagrisso with the same dose but I advised her to take Imodium 1 tablet daily for the diarrhea and if needed she can take it after every episode of diarrhea. The patient is also meeting with the dietitian at the  cancer center for her malnutrition and weight loss. She will come back for follow-up visit in 1 months for evaluation and repeat blood work. She was advised to call immediately if she has any concerning symptoms in the interval. The patient voices understanding of current disease status and treatment options and is in agreement with the current care plan.  All questions were answered. The patient knows to call the clinic with any problems, questions or concerns. We can certainly see the patient much sooner if necessary.   Disclaimer: This note was dictated with voice recognition software. Similar sounding words can inadvertently be transcribed and may not be corrected upon review.

## 2020-09-12 NOTE — Progress Notes (Signed)
Nutrition Follow-up:  Patient with stage IV NSCLC metastatic to brain. She is receiving oral chemotherapy.   Met with patient in clinic today. She reports ongoing altered taste, "nothing taste good" She is typically eats a small bowl of oatmeal made with water, drinking 1-2 Boost mixed with milk, and bites of daily meal provided by facility which she typically does not like, eats 1/3 of tray. Reports foods brought have too much seasoning, she prefers bland. Patient has not requested no seasoning when placing daily orders, states "that message would never make it to the chef" Patient reports joining her friends in the dining hall would be nice, however concerned about being judged if she does not eat much. She is looking forward to taking the facility bus to the grocery store, hopeful of finding some things she likes. Patient has not tried mouthrinse and reports decreased oral care due to toothpaste tasting bad. She has an appointment with dentist next week. Patient is having single episode of watery diarrhea in the mornings. She does not want to take Imodium in the morning as MD recommended because she "already has to take 5 pills."    Medications: Lasix   Labs: Glucose, BUN 33, Cr 1.25, Ca 10.7  Anthropometrics: Weight 110 lb 6.4 oz decreased 3.5%  in the last month; concerning  4/7 - 114 lb 4.8 oz   NUTRITION DIAGNOSIS: Inadequate oral intake continues    INTERVENTION:  Reviewed strategies for altered taste Encouraged using mouth rinse prior and suggested trying different toothpaste Encouraged to inform dietary staff of no added seasonings when ordering meal Encouraged to return to dining hall with friends at meal times Discussed easy prepared snacks/meals (instance mashed potatoes, cheese sticks, yogurt, buttered noodles) Reviewed strategies for adding calories and protein Encouraged drinking 3-4 Ensure supplements daily Encouraged small frequent meals and snacks Ensure coupons  given Patient declined Ensure Max, Boost Breeze, Ensure Clear samples   MONITORING, EVALUATION, GOAL: weight trends, intake   NEXT VISIT: telephone follow-up ~3-4 weeks    

## 2020-09-26 ENCOUNTER — Other Ambulatory Visit: Payer: Self-pay | Admitting: Internal Medicine

## 2020-09-26 ENCOUNTER — Other Ambulatory Visit (HOSPITAL_COMMUNITY): Payer: Self-pay

## 2020-09-26 ENCOUNTER — Other Ambulatory Visit: Payer: Self-pay | Admitting: Pharmacist

## 2020-09-26 DIAGNOSIS — C3492 Malignant neoplasm of unspecified part of left bronchus or lung: Secondary | ICD-10-CM

## 2020-09-26 MED ORDER — OSIMERTINIB MESYLATE 80 MG PO TABS
80.0000 mg | ORAL_TABLET | Freq: Every day | ORAL | 3 refills | Status: DC
  Filled 2020-09-26: qty 30, 30d supply, fill #0
  Filled 2020-10-30: qty 30, 30d supply, fill #1
  Filled 2020-12-01: qty 30, 30d supply, fill #2
  Filled 2020-12-25: qty 30, 30d supply, fill #3

## 2020-09-26 MED ORDER — OSIMERTINIB MESYLATE 80 MG PO TABS
ORAL_TABLET | ORAL | 3 refills | Status: DC
  Filled 2020-09-26: qty 30, 30d supply, fill #0

## 2020-09-26 NOTE — Progress Notes (Signed)
Oral Oncology Pharmacist Encounter  Prescription refill for Tagrisso (osimertinib) sent to  [facility]. Prescription instructions updated to omit "days 1-28" since quantity of prescription is for #30 for insurance purposes.   Leron Croak, PharmD, BCPS Hematology/Oncology Clinical Pharmacist [facility] [phone] 09/26/2020 2:09 PM

## 2020-09-30 ENCOUNTER — Other Ambulatory Visit (HOSPITAL_COMMUNITY): Payer: Self-pay

## 2020-10-02 ENCOUNTER — Other Ambulatory Visit (HOSPITAL_COMMUNITY): Payer: Self-pay

## 2020-10-05 ENCOUNTER — Other Ambulatory Visit: Payer: Self-pay | Admitting: Internal Medicine

## 2020-10-10 ENCOUNTER — Telehealth: Payer: Self-pay

## 2020-10-10 NOTE — Telephone Encounter (Signed)
Pt LM wanting to know if it's ok for her to receive the second COVID booster.   I have LM for the pt advising this is ok.

## 2020-10-16 ENCOUNTER — Other Ambulatory Visit: Payer: Medicare PPO

## 2020-10-16 ENCOUNTER — Ambulatory Visit: Payer: Medicare PPO | Admitting: Internal Medicine

## 2020-10-23 ENCOUNTER — Telehealth: Payer: Self-pay | Admitting: Internal Medicine

## 2020-10-23 ENCOUNTER — Inpatient Hospital Stay (HOSPITAL_BASED_OUTPATIENT_CLINIC_OR_DEPARTMENT_OTHER): Payer: Medicare PPO | Admitting: Internal Medicine

## 2020-10-23 ENCOUNTER — Inpatient Hospital Stay: Payer: Medicare PPO | Attending: Physician Assistant

## 2020-10-23 ENCOUNTER — Other Ambulatory Visit: Payer: Self-pay

## 2020-10-23 VITALS — BP 139/62 | HR 83 | Temp 97.7°F | Resp 18 | Ht 64.0 in | Wt 107.2 lb

## 2020-10-23 DIAGNOSIS — C7931 Secondary malignant neoplasm of brain: Secondary | ICD-10-CM | POA: Insufficient documentation

## 2020-10-23 DIAGNOSIS — I10 Essential (primary) hypertension: Secondary | ICD-10-CM

## 2020-10-23 DIAGNOSIS — Z7982 Long term (current) use of aspirin: Secondary | ICD-10-CM | POA: Insufficient documentation

## 2020-10-23 DIAGNOSIS — C779 Secondary and unspecified malignant neoplasm of lymph node, unspecified: Secondary | ICD-10-CM

## 2020-10-23 DIAGNOSIS — C3412 Malignant neoplasm of upper lobe, left bronchus or lung: Secondary | ICD-10-CM | POA: Insufficient documentation

## 2020-10-23 DIAGNOSIS — C3492 Malignant neoplasm of unspecified part of left bronchus or lung: Secondary | ICD-10-CM

## 2020-10-23 DIAGNOSIS — C7982 Secondary malignant neoplasm of genital organs: Secondary | ICD-10-CM | POA: Diagnosis not present

## 2020-10-23 DIAGNOSIS — Z79899 Other long term (current) drug therapy: Secondary | ICD-10-CM | POA: Diagnosis not present

## 2020-10-23 DIAGNOSIS — C3490 Malignant neoplasm of unspecified part of unspecified bronchus or lung: Secondary | ICD-10-CM

## 2020-10-23 DIAGNOSIS — Z5111 Encounter for antineoplastic chemotherapy: Secondary | ICD-10-CM

## 2020-10-23 LAB — CBC WITH DIFFERENTIAL (CANCER CENTER ONLY)
Abs Immature Granulocytes: 0.02 10*3/uL (ref 0.00–0.07)
Basophils Absolute: 0 10*3/uL (ref 0.0–0.1)
Basophils Relative: 1 %
Eosinophils Absolute: 0.2 10*3/uL (ref 0.0–0.5)
Eosinophils Relative: 3 %
HCT: 35.7 % — ABNORMAL LOW (ref 36.0–46.0)
Hemoglobin: 11.8 g/dL — ABNORMAL LOW (ref 12.0–15.0)
Immature Granulocytes: 0 %
Lymphocytes Relative: 17 %
Lymphs Abs: 1 10*3/uL (ref 0.7–4.0)
MCH: 30.1 pg (ref 26.0–34.0)
MCHC: 33.1 g/dL (ref 30.0–36.0)
MCV: 91.1 fL (ref 80.0–100.0)
Monocytes Absolute: 0.5 10*3/uL (ref 0.1–1.0)
Monocytes Relative: 9 %
Neutro Abs: 3.9 10*3/uL (ref 1.7–7.7)
Neutrophils Relative %: 70 %
Platelet Count: 162 10*3/uL (ref 150–400)
RBC: 3.92 MIL/uL (ref 3.87–5.11)
RDW: 17.7 % — ABNORMAL HIGH (ref 11.5–15.5)
WBC Count: 5.6 10*3/uL (ref 4.0–10.5)
nRBC: 0 % (ref 0.0–0.2)

## 2020-10-23 LAB — CMP (CANCER CENTER ONLY)
ALT: 11 U/L (ref 0–44)
AST: 19 U/L (ref 15–41)
Albumin: 4 g/dL (ref 3.5–5.0)
Alkaline Phosphatase: 43 U/L (ref 38–126)
Anion gap: 7 (ref 5–15)
BUN: 31 mg/dL — ABNORMAL HIGH (ref 8–23)
CO2: 28 mmol/L (ref 22–32)
Calcium: 10.9 mg/dL — ABNORMAL HIGH (ref 8.9–10.3)
Chloride: 106 mmol/L (ref 98–111)
Creatinine: 1.37 mg/dL — ABNORMAL HIGH (ref 0.44–1.00)
GFR, Estimated: 38 mL/min — ABNORMAL LOW (ref >60)
Glucose, Bld: 106 mg/dL — ABNORMAL HIGH (ref 70–99)
Potassium: 4.2 mmol/L (ref 3.5–5.1)
Sodium: 141 mmol/L (ref 135–145)
Total Bilirubin: 0.4 mg/dL (ref 0.3–1.2)
Total Protein: 7.2 g/dL (ref 6.5–8.1)

## 2020-10-23 NOTE — Telephone Encounter (Signed)
Scheduled per los. Gave avs and calendar  

## 2020-10-23 NOTE — Progress Notes (Signed)
[facility] Telephone:([phone]   Fax:(336) [phone]  OFFICE PROGRESS NOTE  Shelley Dad, MD [place] [address]  DIAGNOSIS: Stage IV (T2b, N2, M1c), non-small cell lung cancer, adenocarcinoma.  The patient presented with a left upper lobe mass, right supra clavicular, left hilar and subcarinal lymphadenopathy as well as diffuse left-sided pleural disease with associated malignant left pleural effusion and 2 small lymph nodes near the SMA in addition to multiple brain metastasis diagnosed in December 2021.   Molecular studies by [employer] showed positive EGFR mutation with deletion in exon 19  PRIOR THERAPY: None  CURRENT THERAPY: Tagrisso 80 mg p.o. daily. First dose June 10, 2020.  Status post 4 months of treatment  INTERVAL HISTORY: Shelley Flores 87 y.o. female returns to the clinic today for follow-up visit.  The patient is feeling fine today with no concerning complaints except for few episodes of diarrhea and mild fatigue.  She also has occasional pain on the left side of the chest with sneezing.  She denied having any shortness of breath or hemoptysis.  She denied having any fever or chills.  She lost around 30 pounds since her last visit because of lack of appetite.  She continues to tolerate her treatment with Tagrisso fairly well.  The patient is here today for evaluation and repeat blood work.   MEDICAL HISTORY: Past Medical History:  Diagnosis Date   Bruit    Abdominal bruit - Abdominal aorta/renal duplex Doppler evaluation 12/07/03 -Mildly abnormal evaluation. 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 This is consistant w/median arcuate ligament compression syndrome. *Right & Left Kidney: Essentially equal in size, symmetrical in shape w/no significant abnormalities visualized. *Right & Left Renal Arteries: No significant abnormalities.   Cancer ([place])    Edema extremities    LE edema    H/O myocardial  perfusion scan 02/20/00   To rule out ischemia - Negative adequate Bruce protocol exercise stress test with a deconditioned exercise response and normal static myocardial perfusion images with EF calculated by QGS of 77%. Represents a low risk study.   Hyperlipidemia    Hypertension    Osteoarthritis    Osteoporosis    Pleural effusion 04/2020   Valvular heart disease    Mild valvular heart disease by Echo in 2010, all mild and symptomatic, including concentric LVH, MR, TR, and AI with pulmonary artery pressure of 32. EF was normal.    ALLERGIES:  is allergic to irbesartan.  MEDICATIONS:  Current Outpatient Medications  Medication Sig Dispense Refill   acetaminophen (TYLENOL) 325 MG tablet Take 2 tablets (650 mg total) by mouth every 6 (six) hours as needed for mild pain (or Fever >/= 101). 20 tablet 0   amLODipine (NORVASC) 2.5 MG tablet TAKE 1 TABLET BY MOUTH EVERY DAY 90 tablet 1   aspirin 81 MG tablet Take 81 mg by mouth daily.     atorvastatin (LIPITOR) 20 MG tablet Take 1 tablet (20 mg total) by mouth at bedtime. 90 tablet 1   furosemide (LASIX) 20 MG tablet Take 1 tablet (20 mg total) by mouth as needed for fluid or edema. 30 tablet 6   metoprolol succinate (TOPROL-XL) 50 MG 24 hr tablet Take 50 mg by mouth daily. Take with or immediately following a meal.     Multiple Vitamin (MULTIVITAMIN) tablet Take 1 tablet by mouth daily.     osimertinib mesylate (TAGRISSO) 80 MG tablet Take 1 tablet (80  mg total) by mouth daily. 30 tablet 3   spironolactone (ALDACTONE) 25 MG tablet Take 1 tablet (25 mg total) by mouth daily. 90 tablet 3   zinc oxide 20 % ointment Apply 1 application topically as needed for irritation. apply to buttocks/peri, topical, As Needed, To buttocks after every incontinent episode and as needed for redness. May keep at bedside.     No current facility-administered medications for this visit.    SURGICAL HISTORY:  Past Surgical History:  Procedure Laterality Date    Abdominal aorta/Renal duplex Doppler Evaluation  12/07/03   For abdominal bruit. Mildly abnormal evaluation. (See bruit in medical history)   BREAST EXCISIONAL BIOPSY Left 1966   BREAST EXCISIONAL BIOPSY Left 1999   CATARACT EXTRACTION  2003   CHEST TUBE INSERTION N/A 07/04/2020   Procedure: REMOVAL PLEURAL DRAINAGE CATHETER;  Surgeon: Garner Nash, DO;  Location: [facility];  Service: Pulmonary;  Laterality: N/A;   COLONOSCOPY  10/22/2004   DEXA Bone Scan  06/23/2013   IR PERC PLEURAL DRAIN W/INDWELL CATH W/IMG GUIDE  05/10/2020   IR THORACENTESIS ASP PLEURAL SPACE W/IMG GUIDE  05/04/2020    REVIEW OF SYSTEMS:  A comprehensive review of systems was negative except for: Constitutional: positive for anorexia and weight loss Respiratory: positive for pleurisy/chest pain Gastrointestinal: positive for diarrhea   PHYSICAL EXAMINATION: General appearance: alert, cooperative, fatigued, and no distress Head: Normocephalic, without obvious abnormality, atraumatic Neck: no adenopathy, no JVD, supple, symmetrical, trachea midline, and thyroid not enlarged, symmetric, no tenderness/mass/nodules Lymph nodes: Cervical, supraclavicular, and axillary nodes normal. Resp: clear to auscultation bilaterally Back: symmetric, no curvature. ROM normal. No CVA tenderness. Cardio: regular rate and rhythm, S1, S2 normal, no murmur, click, rub or gallop GI: soft, non-tender; bowel sounds normal; no masses,  no organomegaly Extremities: extremities normal, atraumatic, no cyanosis or edema  ECOG PERFORMANCE STATUS: 1 - Symptomatic but completely ambulatory  Blood pressure 139/62, pulse 83, temperature 97.7 F (36.5 C), temperature source Tympanic, resp. rate 18, height '5\' 4"'  (1.626 m), weight 107 lb 3.2 oz (48.6 kg), SpO2 100 %.  LABORATORY DATA: Lab Results  Component Value Date   WBC 5.6 10/23/2020   HGB 11.8 (L) 10/23/2020   HCT 35.7 (L) 10/23/2020   MCV 91.1 10/23/2020   PLT 162 10/23/2020       Chemistry      Component Value Date/Time   NA 140 09/12/2020 1039   NA 137 05/25/2020 0000   K 4.3 09/12/2020 1039   CL 106 09/12/2020 1039   CO2 27 09/12/2020 1039   BUN 33 (H) 09/12/2020 1039   BUN 12 05/25/2020 0000   CREATININE 1.25 (H) 09/12/2020 1039   GLU 83 05/25/2020 0000      Component Value Date/Time   CALCIUM 10.7 (H) 09/12/2020 1039   ALKPHOS 40 09/12/2020 1039   AST 19 09/12/2020 1039   ALT 11 09/12/2020 1039   BILITOT 0.5 09/12/2020 1039       RADIOGRAPHIC STUDIES: No results found.   ASSESSMENT AND PLAN: This is a very pleasant 87 years old white female recently diagnosed with a stage IV (T3, N3, M1 C) non-small cell lung cancer, adenocarcinoma with positive EGFR mutation with deletion in exon 19 diagnosed in December 2021 and presented with left lower lobe lung mass in addition to right supraclavicular, left hilar and subcarinal lymphadenopathy as well as metastatic lymphadenopathy in the abdomen and left pleural-based metastasis as well as malignant left pleural effusion and multiple brain metastasis. The  molecular study showed positive EGFR mutation with deletion in exon 19. The patient started treatment with Tagrisso on June 10, 2020.  Status post 4 months of treatment. The patient continues to tolerate her treatment with Tagrisso fairly well except for intermittent diarrhea I recommended for her to continue her treatment with the same dose for now. I will see her back for follow-up visit in 1 months for evaluation with repeat CT scan of the chest, abdomen pelvis for restaging of her disease. For the renal insufficiency and hypercalcemia, the patient was advised to increase her hydration and to decrease the calcium rich diet and supplements. She was advised to call immediately if she has any concerning symptoms in the interval. The patient voices understanding of current disease status and treatment options and is in agreement with the current care  plan.  All questions were answered. The patient knows to call the clinic with any problems, questions or concerns. We can certainly see the patient much sooner if necessary.   Disclaimer: This note was dictated with voice recognition software. Similar sounding words can inadvertently be transcribed and may not be corrected upon review.

## 2020-10-27 ENCOUNTER — Other Ambulatory Visit (HOSPITAL_COMMUNITY): Payer: Self-pay

## 2020-10-30 ENCOUNTER — Other Ambulatory Visit (HOSPITAL_COMMUNITY): Payer: Self-pay

## 2020-10-31 ENCOUNTER — Telehealth: Payer: Self-pay | Admitting: Dietician

## 2020-10-31 NOTE — Telephone Encounter (Signed)
Nutrition Follow-up:   Patient with stage IV NSCLC metastatic to brain. She is receiving oral chemotherapy.  Spoke with patient via telephone. Patient reports just having "breakfast of pills" She continues to have "bad taste" but is making herself eat more. She reports new dining room opening at [facility] and will be changing delivery system as well as offering new food items. She is hoping of finding food items she likes. Patient has been taking community bus to [employer]. She is enjoying shopping for herself and trying a variety of new foods that sound good to her. Patient has been eating oatmeal or grits for breakfast, sometimes dry cereal (cheerios, harvest grain). Had 1/2 package of grits and 1/2 english muffin with harvarti cheese today. She has been eating pickled beets, deli meats, cottage cheese, drinking some Boost (maybe one/day) and eating unsalted saltines with peanut butter. Patient reports diarrhea has improved, says she is having loose stools, more so in the morning. She is not taking Imodium. Patient is hopeful of decreasing chemo dosage pending upcoming scan results. Patient reports 20 years of hypercalcemia, she is not taking Ca supplements.   Medications: reviewed  Labs: 6/13 Glucose 106, BUN 31, Cr 1.37, Ca 10.9  Anthropometrics: Weight 107 lb 3.2 oz on 6/13 decreased ~3 lbs (2.7%) in 6 weeks which is insignificant for time frame  5/3 - 110 lb 6.4 oz 4/5 - 114 lb 4.8 oz 4/5 - 114 lb 4.8 oz   NUTRITION DIAGNOSIS: Inadequate oral intake continues   INTERVENTION:  Continue trying new foods, encouraged shopping for high calorie, high protein items Reviewed strategies for improving oral intake  Encouraged increasing Boost/Ensure supplements  Will mail Ensure coupons     MONITORING, EVALUATION, GOAL: weight trends, intake   NEXT VISIT: via telephone ~4 weeks

## 2020-11-01 ENCOUNTER — Other Ambulatory Visit (HOSPITAL_COMMUNITY): Payer: Self-pay

## 2020-11-08 ENCOUNTER — Non-Acute Institutional Stay: Payer: Medicare PPO | Admitting: Internal Medicine

## 2020-11-08 ENCOUNTER — Other Ambulatory Visit: Payer: Self-pay

## 2020-11-08 ENCOUNTER — Encounter: Payer: Self-pay | Admitting: Internal Medicine

## 2020-11-08 VITALS — BP 138/76 | HR 76 | Temp 96.3°F | Ht 64.0 in | Wt 105.7 lb

## 2020-11-08 DIAGNOSIS — R634 Abnormal weight loss: Secondary | ICD-10-CM | POA: Diagnosis not present

## 2020-11-08 DIAGNOSIS — C3492 Malignant neoplasm of unspecified part of left bronchus or lung: Secondary | ICD-10-CM

## 2020-11-08 DIAGNOSIS — R600 Localized edema: Secondary | ICD-10-CM

## 2020-11-08 DIAGNOSIS — K219 Gastro-esophageal reflux disease without esophagitis: Secondary | ICD-10-CM

## 2020-11-08 DIAGNOSIS — C3491 Malignant neoplasm of unspecified part of right bronchus or lung: Secondary | ICD-10-CM

## 2020-11-08 DIAGNOSIS — E782 Mixed hyperlipidemia: Secondary | ICD-10-CM | POA: Diagnosis not present

## 2020-11-08 DIAGNOSIS — I10 Essential (primary) hypertension: Secondary | ICD-10-CM | POA: Diagnosis not present

## 2020-11-09 NOTE — Progress Notes (Addendum)
Location:  [facility] of Service:  Clinic (12)  Provider:   Code Status: DNR Goals of Care:  Advanced Directives 11/08/2020  Does Patient Have a Medical Advance Directive? Yes  Type of Par[employer] of [place];Living will;Out of facility DNR (pink MOST or yellow form)  Does patient want to make changes to medical advance directive? No - Guardian declined  Copy of [place] in Chart? Yes - validated most recent copy scanned in chart (See row information)  Pre-existing out of facility DNR order (yellow form or pink MOST form) Yellow form placed in chart (order not valid for inpatient use)     Chief Complaint  Patient presents with   Medical Management of Chronic Issues    Patient returns the clinic for follow up.    Health Maintenance    Patient questions whether or not the oncologist would suggest she get vaccine.     HPI: Patient is a 87 y.o. female seen today for medical management of chronic diseases.    Patient has a history of hypertension hyperlipidemia, lower extremity edema Patient was admitted from 12/20-12/31 for Left Pleural effusion with Hypoxia Followed by Diagnosis of Lung cancer PET scan and MRI shows Metastatic Disease Oncology has started her on [place] with Dr Earlie Server Has lost 8-9 Lbs since her Last visit No Appetite. Feels Full very easily C/o Mild Pain in her Left Lower chest area but bearable Feels Easily Fatigue and Tired Denies any dizziness or fall Walking with her walker  Patient does not have any close family.  She never married and had no Kids Her friend is her healthcare power of attorney   Past Medical History:  Diagnosis Date   Bruit    Abdominal bruit - Abdominal aorta/renal duplex Doppler evaluation 12/07/03 -Mildly abnormal evaluation. 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 This is consistant w/median arcuate ligament compression syndrome.  *Right & Left Kidney: Essentially equal in size, symmetrical in shape w/no significant abnormalities visualized. *Right & Left Renal Arteries: No significant abnormalities.   Cancer ([place])    Edema extremities    LE edema    H/O myocardial perfusion scan 02/20/00   To rule out ischemia - Negative adequate Bruce protocol exercise stress test with a deconditioned exercise response and normal static myocardial perfusion images with EF calculated by QGS of 77%. Represents a low risk study.   Hyperlipidemia    Hypertension    Osteoarthritis    Osteoporosis    Pleural effusion 04/2020   Valvular heart disease    Mild valvular heart disease by Echo in 2010, all mild and symptomatic, including concentric LVH, MR, TR, and AI with pulmonary artery pressure of 32. EF was normal.    Past Surgical History:  Procedure Laterality Date   Abdominal aorta/Renal duplex Doppler Evaluation  12/07/03   For abdominal bruit. Mildly abnormal evaluation. (See bruit in medical history)   BREAST EXCISIONAL BIOPSY Left 1966   BREAST EXCISIONAL BIOPSY Left 1999   CATARACT EXTRACTION  2003   CHEST TUBE INSERTION N/A 07/04/2020   Procedure: REMOVAL PLEURAL DRAINAGE CATHETER;  Surgeon: Garner Nash, DO;  Location: [facility];  Service: Pulmonary;  Laterality: N/A;   COLONOSCOPY  10/22/2004   DEXA Bone Scan  06/23/2013   IR PERC PLEURAL DRAIN W/INDWELL CATH W/IMG GUIDE  05/10/2020   IR THORACENTESIS ASP PLEURAL SPACE W/IMG GUIDE  05/04/2020    Allergies  Allergen Reactions  Irbesartan Nausea Only    Outpatient Encounter Medications as of 11/08/2020  Medication Sig   acetaminophen (TYLENOL) 325 MG tablet Take 2 tablets (650 mg total) by mouth every 6 (six) hours as needed for mild pain (or Fever >/= 101).   amLODipine (NORVASC) 2.5 MG tablet TAKE 1 TABLET BY MOUTH EVERY DAY   aspirin 81 MG tablet Take 81 mg by mouth daily.   atorvastatin (LIPITOR) 20 MG tablet Take 1 tablet (20 mg total) by mouth at bedtime.    loperamide (IMODIUM A-D) 2 MG capsule Take by mouth as needed for diarrhea or loose stools.   metoprolol succinate (TOPROL-XL) 50 MG 24 hr tablet Take 50 mg by mouth daily. Take with or immediately following a meal.   Multiple Vitamin (MULTIVITAMIN) tablet Take 1 tablet by mouth daily.   osimertinib mesylate (TAGRISSO) 80 MG tablet Take 1 tablet (80 mg total) by mouth daily.   spironolactone (ALDACTONE) 25 MG tablet Take 1 tablet (25 mg total) by mouth daily.   zinc oxide 20 % ointment Apply 1 application topically as needed for irritation. apply to buttocks/peri, topical, As Needed, To buttocks after every incontinent episode and as needed for redness. May keep at bedside.   [DISCONTINUED] furosemide (LASIX) 20 MG tablet Take 1 tablet (20 mg total) by mouth as needed for fluid or edema. (Patient not taking: Reported on 11/08/2020)   No facility-administered encounter medications on file as of 11/08/2020.    Review of Systems:  Review of Systems  Constitutional:  Positive for activity change, appetite change and unexpected weight change.  HENT: Negative.    Respiratory:  Negative for cough and shortness of breath.   Cardiovascular:  Positive for leg swelling.  Gastrointestinal: Negative.   Genitourinary: Negative.   Musculoskeletal:  Positive for arthralgias and myalgias.  Skin: Negative.   Neurological:  Positive for weakness.  Psychiatric/Behavioral: Negative.     Health Maintenance  Topic Date Due   Zoster Vaccines- Shingrix (1 of 2) Never done   INFLUENZA VACCINE  12/11/2020   COVID-19 Vaccine (5 - Booster) 02/17/2021   TETANUS/TDAP  10/17/2029   DEXA SCAN  Completed   PNA vac Low Risk Adult  Completed   HPV VACCINES  Aged Out    Physical Exam: Vitals:   11/08/20 1328  BP: 138/76  Pulse: 76  Temp: (!) 96.3 F (35.7 C)  SpO2: 99%  Weight: 105 lb 11.2 oz (47.9 kg)  Height: 5\' 4"  (1.626 m)   Body mass index is 18.14 kg/m. Physical Exam Constitutional: Oriented to  person, place, and time. Very frail HENT:  Head: Normocephalic.  Mouth/Throat: Oropharynx is clear and moist.  Eyes: Pupils are equal, round, and reactive to light.  Neck: Neck supple.  Cardiovascular: Normal rate and normal heart sounds.  No murmur heard. Pulmonary/Chest: Effort normal and breath sounds normal. No respiratory distress. No wheezes. She has no rales.  Abdominal: Soft. Bowel sounds are normal. No distension. There is no tenderness. There is no rebound.  Musculoskeletal: Mild Edema Bilateral  Lymphadenopathy: none Neurological: Alert and oriented to person, place, and time.  Skin: Skin is warm and dry.  Psychiatric: Normal mood and affect. Behavior is normal. Thought content normal.   Labs reviewed: Basic Metabolic Panel: Recent Labs    05/09/20 0319 05/10/20 0322 05/11/20 0246 05/17/20 1300 08/11/20 1008 09/12/20 1039 10/23/20 1505  NA 136 137 135   < > 137 140 141  K 4.3 4.2 3.9   < > 4.3 4.3 4.2  CL 106 105 104   < > 104 106 106  CO2 26 23 24    < > 23 27 28   GLUCOSE 108* 112* 115*   < > 97 101* 106*  BUN 8 11 12    < > 28* 33* 31*  CREATININE 0.79 0.77 0.81   < > 1.06* 1.25* 1.37*  CALCIUM 9.6 9.8 9.6   < > 10.3 10.7* 10.9*  MG 2.1 2.1 2.1  --   --   --   --   PHOS 3.1 3.0 2.7  --   --   --   --    < > = values in this interval not displayed.   Liver Function Tests: Recent Labs    08/11/20 1008 09/12/20 1039 10/23/20 1505  AST 20 19 19   ALT 16 11 11   ALKPHOS 42 40 43  BILITOT 0.6 0.5 0.4  PROT 7.4 7.2 7.2  ALBUMIN 3.9 4.0 4.0   No results for input(s): LIPASE, AMYLASE in the last 8760 hours. No results for input(s): AMMONIA in the last 8760 hours. CBC: Recent Labs    08/11/20 1008 09/12/20 1039 10/23/20 1505  WBC 6.9 6.7 5.6  NEUTROABS 4.8 4.9 3.9  HGB 12.5 12.1 11.8*  HCT 38.1 36.4 35.7*  MCV 88.6 89.7 91.1  PLT 203 185 162   Lipid Panel: Recent Labs    04/14/20 0829  CHOL 139  HDL 53  LDLCALC 68  TRIG 97  CHOLHDL 2.6   No  results found for: HGBA1C  Procedures since last visit: No results found.  Assessment/Plan 1. Malignant neoplasm of both lungs ([place]) Tolerating Tagrisso Some loose stool but does not bother her Repeat Imaging Pending per Oncology SOB on exertion but otherwise staying stable  2. Essential hypertension I discussed about discontinuing Norvasc. Just concern about her Weight loss and Weakness She will d/w Cardiology  3. Mixed hyperlipidemia I also told her to stop statin as with her prognosis and Weight loss do not see the benefit She will Discuss with Cardiology  4. Gastroesophageal reflux disease, unspecified whether esophagitis present Can uses Pepcid PRn  5. Weight loss Very concerned about her weight loss Has repeat CT scan pending  6. Bilateral leg edema Controlled on Aldactone  Off lasix 7 Osteoporosis Was on Fosamax She wants to consider restarting I have told her to wait till her CT report comes I don't  want anything to make her Appetite worse   Labs/tests ordered:  Per Oncology and Cardiology per patient request  Next appt:  Visit date not found

## 2020-11-17 ENCOUNTER — Ambulatory Visit (HOSPITAL_COMMUNITY)
Admission: RE | Admit: 2020-11-17 | Discharge: 2020-11-17 | Disposition: A | Discharge status: Home / Self Care | Payer: Medicare PPO | Source: Ambulatory Visit | Attending: Internal Medicine | Admitting: Internal Medicine

## 2020-11-17 ENCOUNTER — Other Ambulatory Visit: Payer: Self-pay

## 2020-11-17 ENCOUNTER — Inpatient Hospital Stay: Payer: Medicare PPO | Attending: Physician Assistant

## 2020-11-17 DIAGNOSIS — R911 Solitary pulmonary nodule: Secondary | ICD-10-CM | POA: Diagnosis not present

## 2020-11-17 DIAGNOSIS — R630 Anorexia: Secondary | ICD-10-CM | POA: Diagnosis not present

## 2020-11-17 DIAGNOSIS — I10 Essential (primary) hypertension: Secondary | ICD-10-CM | POA: Insufficient documentation

## 2020-11-17 DIAGNOSIS — C3490 Malignant neoplasm of unspecified part of unspecified bronchus or lung: Secondary | ICD-10-CM | POA: Diagnosis not present

## 2020-11-17 DIAGNOSIS — I700 Atherosclerosis of aorta: Secondary | ICD-10-CM | POA: Diagnosis not present

## 2020-11-17 DIAGNOSIS — Z1501 Genetic susceptibility to malignant neoplasm of breast: Secondary | ICD-10-CM | POA: Insufficient documentation

## 2020-11-17 DIAGNOSIS — C7951 Secondary malignant neoplasm of bone: Secondary | ICD-10-CM | POA: Insufficient documentation

## 2020-11-17 DIAGNOSIS — K769 Liver disease, unspecified: Secondary | ICD-10-CM | POA: Diagnosis not present

## 2020-11-17 DIAGNOSIS — J479 Bronchiectasis, uncomplicated: Secondary | ICD-10-CM | POA: Diagnosis not present

## 2020-11-17 DIAGNOSIS — C7931 Secondary malignant neoplasm of brain: Secondary | ICD-10-CM | POA: Insufficient documentation

## 2020-11-17 DIAGNOSIS — C779 Secondary and unspecified malignant neoplasm of lymph node, unspecified: Secondary | ICD-10-CM | POA: Insufficient documentation

## 2020-11-17 DIAGNOSIS — C3412 Malignant neoplasm of upper lobe, left bronchus or lung: Secondary | ICD-10-CM | POA: Diagnosis not present

## 2020-11-17 DIAGNOSIS — Z7982 Long term (current) use of aspirin: Secondary | ICD-10-CM | POA: Insufficient documentation

## 2020-11-17 DIAGNOSIS — D7389 Other diseases of spleen: Secondary | ICD-10-CM | POA: Diagnosis not present

## 2020-11-17 DIAGNOSIS — Z79899 Other long term (current) drug therapy: Secondary | ICD-10-CM | POA: Insufficient documentation

## 2020-11-17 LAB — CMP (CANCER CENTER ONLY)
ALT: 14 U/L (ref 0–44)
AST: 18 U/L (ref 15–41)
Albumin: 4 g/dL (ref 3.5–5.0)
Alkaline Phosphatase: 45 U/L (ref 38–126)
Anion gap: 7 (ref 5–15)
BUN: 29 mg/dL — ABNORMAL HIGH (ref 8–23)
CO2: 27 mmol/L (ref 22–32)
Calcium: 10.9 mg/dL — ABNORMAL HIGH (ref 8.9–10.3)
Chloride: 105 mmol/L (ref 98–111)
Creatinine: 1.42 mg/dL — ABNORMAL HIGH (ref 0.44–1.00)
GFR, Estimated: 36 mL/min — ABNORMAL LOW (ref >60)
Glucose, Bld: 102 mg/dL — ABNORMAL HIGH (ref 70–99)
Potassium: 4.5 mmol/L (ref 3.5–5.1)
Sodium: 139 mmol/L (ref 135–145)
Total Bilirubin: 0.4 mg/dL (ref 0.3–1.2)
Total Protein: 7.2 g/dL (ref 6.5–8.1)

## 2020-11-17 LAB — CBC WITH DIFFERENTIAL (CANCER CENTER ONLY)
Abs Immature Granulocytes: 0.01 10*3/uL (ref 0.00–0.07)
Basophils Absolute: 0 10*3/uL (ref 0.0–0.1)
Basophils Relative: 1 %
Eosinophils Absolute: 0.2 10*3/uL (ref 0.0–0.5)
Eosinophils Relative: 3 %
HCT: 35.4 % — ABNORMAL LOW (ref 36.0–46.0)
Hemoglobin: 11.9 g/dL — ABNORMAL LOW (ref 12.0–15.0)
Immature Granulocytes: 0 %
Lymphocytes Relative: 17 %
Lymphs Abs: 1 10*3/uL (ref 0.7–4.0)
MCH: 31.3 pg (ref 26.0–34.0)
MCHC: 33.6 g/dL (ref 30.0–36.0)
MCV: 93.2 fL (ref 80.0–100.0)
Monocytes Absolute: 0.5 10*3/uL (ref 0.1–1.0)
Monocytes Relative: 8 %
Neutro Abs: 4.2 10*3/uL (ref 1.7–7.7)
Neutrophils Relative %: 71 %
Platelet Count: 154 10*3/uL (ref 150–400)
RBC: 3.8 MIL/uL — ABNORMAL LOW (ref 3.87–5.11)
RDW: 16.6 % — ABNORMAL HIGH (ref 11.5–15.5)
WBC Count: 5.9 10*3/uL (ref 4.0–10.5)
nRBC: 0 % (ref 0.0–0.2)

## 2020-11-17 MED ORDER — IOHEXOL 350 MG/ML SOLN
75.0000 mL | Freq: Once | INTRAVENOUS | Status: AC | PRN
  Administered 2020-11-17: 75 mL via INTRAVENOUS

## 2020-11-21 ENCOUNTER — Other Ambulatory Visit: Payer: Self-pay

## 2020-11-21 ENCOUNTER — Inpatient Hospital Stay: Payer: Medicare PPO | Admitting: Internal Medicine

## 2020-11-21 VITALS — BP 121/59 | HR 72 | Temp 98.1°F | Resp 16 | Ht 64.0 in | Wt 104.7 lb

## 2020-11-21 DIAGNOSIS — C3492 Malignant neoplasm of unspecified part of left bronchus or lung: Secondary | ICD-10-CM | POA: Diagnosis not present

## 2020-11-21 DIAGNOSIS — Z7982 Long term (current) use of aspirin: Secondary | ICD-10-CM | POA: Diagnosis not present

## 2020-11-21 DIAGNOSIS — I10 Essential (primary) hypertension: Secondary | ICD-10-CM | POA: Diagnosis not present

## 2020-11-21 DIAGNOSIS — Z1501 Genetic susceptibility to malignant neoplasm of breast: Secondary | ICD-10-CM | POA: Diagnosis not present

## 2020-11-21 DIAGNOSIS — C7951 Secondary malignant neoplasm of bone: Secondary | ICD-10-CM | POA: Diagnosis not present

## 2020-11-21 DIAGNOSIS — Z5111 Encounter for antineoplastic chemotherapy: Secondary | ICD-10-CM

## 2020-11-21 DIAGNOSIS — C7931 Secondary malignant neoplasm of brain: Secondary | ICD-10-CM | POA: Diagnosis not present

## 2020-11-21 DIAGNOSIS — C3412 Malignant neoplasm of upper lobe, left bronchus or lung: Secondary | ICD-10-CM | POA: Diagnosis not present

## 2020-11-21 DIAGNOSIS — R630 Anorexia: Secondary | ICD-10-CM | POA: Diagnosis not present

## 2020-11-21 DIAGNOSIS — Z79899 Other long term (current) drug therapy: Secondary | ICD-10-CM | POA: Diagnosis not present

## 2020-11-21 DIAGNOSIS — C779 Secondary and unspecified malignant neoplasm of lymph node, unspecified: Secondary | ICD-10-CM | POA: Diagnosis not present

## 2020-11-21 NOTE — Progress Notes (Signed)
[place] [phone]   [phone]  OFFICE PROGRESS NOTE  Shelley Dad, Shelley Flores [place] [address]  DIAGNOSIS: Stage IV (T2b, N2, M1c), non-small cell lung cancer, adenocarcinoma.  The patient presented with a left upper lobe mass, right supra clavicular, left hilar and subcarinal lymphadenopathy as well as diffuse left-sided pleural disease with associated malignant left pleural effusion and 2 small lymph nodes near the SMA in addition to multiple brain metastasis diagnosed in December 2021.   Molecular studies by [facility] showed positive EGFR mutation with deletion in exon 19  PRIOR THERAPY: None  CURRENT THERAPY: Tagrisso 80 mg p.o. daily. First dose June 10, 2020.  Status post 5.5 months of treatment  INTERVAL HISTORY: Shelley Flores 87 y.o. female returns to the clinic today for follow-up visit.  The patient is feeling fine except for the fatigue and lack of appetite.  She lost another 3 pounds since her last visit.  The previous progress note indicated 30 pound weight loss but this was a typing error and actually it was 3 pounds.  She denied having any current chest pain but definitely has shortness of breath with exertion with no cough or hemoptysis.  She denied having any nausea, vomiting, diarrhea or constipation.  She has no headache or visual changes.  She has been tolerating her treatment with Tagrisso fairly well.  The patient had repeat CT scan of the chest, abdomen pelvis performed recently and she is here for evaluation and discussion of her scan results.  MEDICAL HISTORY: Past Medical History:  Diagnosis Date   Bruit    Abdominal bruit - Abdominal aorta/renal duplex Doppler evaluation 12/07/03 -Mildly abnormal evaluation. 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 This is consistant w/median arcuate ligament compression syndrome. *Right & Left Kidney: Essentially equal in size, symmetrical in  shape w/no significant abnormalities visualized. *Right & Left Renal Arteries: No significant abnormalities.   Cancer ([place])    Edema extremities    LE edema    H/O myocardial perfusion scan 02/20/00   To rule out ischemia - Negative adequate Bruce protocol exercise stress test with a deconditioned exercise response and normal static myocardial perfusion images with EF calculated by QGS of 77%. Represents a low risk study.   Hyperlipidemia    Hypertension    Osteoarthritis    Osteoporosis    Pleural effusion 04/2020   Valvular heart disease    Mild valvular heart disease by Echo in 2010, all mild and symptomatic, including concentric LVH, MR, TR, and AI with pulmonary artery pressure of 32. EF was normal.    ALLERGIES:  is allergic to irbesartan.  MEDICATIONS:  Current Outpatient Medications  Medication Sig Dispense Refill   acetaminophen (TYLENOL) 325 MG tablet Take 2 tablets (650 mg total) by mouth every 6 (six) hours as needed for mild pain (or Fever >/= 101). 20 tablet 0   amLODipine (NORVASC) 2.5 MG tablet TAKE 1 TABLET BY MOUTH EVERY DAY 90 tablet 1   aspirin 81 MG tablet Take 81 mg by mouth daily.     atorvastatin (LIPITOR) 20 MG tablet Take 1 tablet (20 mg total) by mouth at bedtime. 90 tablet 1   loperamide (IMODIUM A-D) 2 MG capsule Take by mouth as needed for diarrhea or loose stools.     metoprolol succinate (TOPROL-XL) 50 MG 24 hr tablet Take 50 mg by mouth daily. Take with or immediately following a meal.  Multiple Vitamin (MULTIVITAMIN) tablet Take 1 tablet by mouth daily.     osimertinib mesylate (TAGRISSO) 80 MG tablet Take 1 tablet (80 mg total) by mouth daily. 30 tablet 3   spironolactone (ALDACTONE) 25 MG tablet Take 1 tablet (25 mg total) by mouth daily. 90 tablet 3   zinc oxide 20 % ointment Apply 1 application topically as needed for irritation. apply to buttocks/peri, topical, As Needed, To buttocks after every incontinent episode and as needed for redness. May  keep at bedside.     No current facility-administered medications for this visit.    SURGICAL HISTORY:  Past Surgical History:  Procedure Laterality Date   Abdominal aorta/Renal duplex Doppler Evaluation  12/07/03   For abdominal bruit. Mildly abnormal evaluation. (See bruit in medical history)   BREAST EXCISIONAL BIOPSY Left 1966   BREAST EXCISIONAL BIOPSY Left 1999   CATARACT EXTRACTION  2003   CHEST TUBE INSERTION N/A 07/04/2020   Procedure: REMOVAL PLEURAL DRAINAGE CATHETER;  Surgeon: Garner Nash, DO;  Location: [facility];  Service: Pulmonary;  Laterality: N/A;   COLONOSCOPY  10/22/2004   DEXA Bone Scan  06/23/2013   IR PERC PLEURAL DRAIN W/INDWELL CATH W/IMG GUIDE  05/10/2020   IR THORACENTESIS ASP PLEURAL SPACE W/IMG GUIDE  05/04/2020    REVIEW OF SYSTEMS:  Constitutional: positive for anorexia, fatigue, and weight loss Eyes: negative Ears, nose, mouth, throat, and face: negative Respiratory: positive for dyspnea on exertion Cardiovascular: negative Gastrointestinal: negative Genitourinary:negative Integument/breast: negative Hematologic/lymphatic: negative Musculoskeletal:positive for muscle weakness Neurological: negative Behavioral/Psych: negative Endocrine: negative Allergic/Immunologic: negative   PHYSICAL EXAMINATION: General appearance: alert, cooperative, fatigued, and no distress Head: Normocephalic, without obvious abnormality, atraumatic Neck: no adenopathy, no JVD, supple, symmetrical, trachea midline, and thyroid not enlarged, symmetric, no tenderness/mass/nodules Lymph nodes: Cervical, supraclavicular, and axillary nodes normal. Resp: clear to auscultation bilaterally Back: symmetric, no curvature. ROM normal. No CVA tenderness. Cardio: regular rate and rhythm, S1, S2 normal, no murmur, click, rub or gallop GI: soft, non-tender; bowel sounds normal; no masses,  no organomegaly Extremities: extremities normal, atraumatic, no cyanosis or  edema Neurologic: Alert and oriented X 3, normal strength and tone. Normal symmetric reflexes. Normal coordination and gait  ECOG PERFORMANCE STATUS: 1 - Symptomatic but completely ambulatory  Blood pressure (!) 121/59, pulse 72, temperature 98.1 F (36.7 C), temperature source Oral, resp. rate 16, height '5\' 4"'  (1.626 m), weight 104 lb 11.2 oz (47.5 kg), SpO2 100 %.  LABORATORY DATA: Lab Results  Component Value Date   WBC 5.9 11/17/2020   HGB 11.9 (L) 11/17/2020   HCT 35.4 (L) 11/17/2020   MCV 93.2 11/17/2020   PLT 154 11/17/2020      Chemistry      Component Value Date/Time   NA 139 11/17/2020 1127   NA 137 05/25/2020 0000   K 4.5 11/17/2020 1127   CL 105 11/17/2020 1127   CO2 27 11/17/2020 1127   BUN 29 (H) 11/17/2020 1127   BUN 12 05/25/2020 0000   CREATININE 1.42 (H) 11/17/2020 1127   GLU 83 05/25/2020 0000      Component Value Date/Time   CALCIUM 10.9 (H) 11/17/2020 1127   ALKPHOS 45 11/17/2020 1127   AST 18 11/17/2020 1127   ALT 14 11/17/2020 1127   BILITOT 0.4 11/17/2020 1127       RADIOGRAPHIC STUDIES: CT Chest W Contrast  Result Date: 11/18/2020 CLINICAL DATA:  Lung cancer, ongoing oral chemotherapy. EXAM: CT CHEST, ABDOMEN, AND PELVIS WITH CONTRAST TECHNIQUE: Multidetector CT  imaging of the chest, abdomen and pelvis was performed following the standard protocol during bolus administration of intravenous contrast. CONTRAST:  72m OMNIPAQUE IOHEXOL 350 MG/ML SOLN COMPARISON:  08/11/2020. FINDINGS: CT CHEST FINDINGS Cardiovascular: Atherosclerotic calcification of the aorta and aortic valve. Coronary artery calcification, including the left main coronary artery. Heart is at the upper limits of normal in size to mildly enlarged. No pericardial effusion. Mediastinum/Nodes: Low internal jugular lymph nodes are not enlarged by CT size criteria. Millimetric low-attenuation lesions in the thyroid. No follow-up recommended. (Ref: J Am Coll Radiol. 2015 Feb;12(2):  143-50).Mediastinal lymph nodes are not enlarged by CT size criteria. No hilar lymph nodes. Axillary lymph nodes are subcentimeter in short axis size, as before. Esophagus is grossly unremarkable. Lungs/Pleura: Mild biapical pleuroparenchymal scarring. Mild central bronchiectasis. 3 mm peripheral right lower lobe nodule (6/110), stable. Other tiny right lower lobe nodule seen on 08/11/2020 is not appreciated. Nodular consolidation in medial left upper lobe has decreased in size, now measuring 1.9 x 1.9 cm (6/77), compared to 2.2 x 2.9 cm on 08/11/2020. Associated volume loss in the left upper lobe. Scarring in the right middle lobe and inferior lingula. Additional linear scarring in the subpleural lateral left lower lobe. No pleural fluid. Airway is unremarkable. Musculoskeletal: Rounded sclerosis in the inferior T5 vertebral body, similar. New area of focal sclerosis in the posterior aspect of the T5 vertebral body (4/84). CT ABDOMEN PELVIS FINDINGS Hepatobiliary: Subcentimeter low-attenuation lesions in the liver, similar and likely cysts. Liver and gallbladder are otherwise unremarkable. No biliary ductal dilatation. Pancreas: 10 mm low-attenuation lesion in the pancreatic tail, similar (2/67). Spleen: Millimetric low-attenuation lesions in the spleen, too small to characterize. Adrenals/Urinary Tract: Slight nodular thickening of the lateral limb right adrenal gland. Left adrenal gland is unremarkable. Low-attenuation lesions in the kidneys measure up to 6.3 cm on the left with the largest lesion showing mild peripheral calcification. Findings are indicative of a combination of simple and minimally complex cysts. Subcentimeter lesions are too small to definitively characterize. Ureters are decompressed. Bladder is grossly unremarkable. Stomach/Bowel: Stomach, small bowel and colon are unremarkable. Appendix is not readily visualized. Vascular/Lymphatic: Atherosclerotic calcification of the aorta. Scattered lymph  nodes are not enlarged by CT size criteria. Reproductive: Hysterectomy.  No adnexal mass. Other: Trace pelvic free fluid. Musculoskeletal: Rounded sclerotic lesions are seen in the L1 and L3 vertebral bodies as well as medial left iliac wing, as before. Dense sclerotic lesion in the lateral right iliac wing, unchanged. No new lesions. IMPRESSION: 1. Left upper lobe nodule continues to decrease in size. 3 mm right lower lobe nodule is unchanged. A second tiny nodule seen in the right lower lobe on 08/11/2020 is no longer appreciated. 2. New sclerotic lesion in the T5 vertebral body. Other osseous metastases are unchanged. 3. 10 mm low-attenuation lesion in the pancreatic tail, unchanged. Continued attention on follow-up is recommended. 4. Aortic atherosclerosis (ICD10-I70.0). Coronary artery calcification, including the left main coronary artery. Electronically Signed   By: MLorin PicketM.D.   On: 11/18/2020 14:56   CT Abdomen Pelvis W Contrast  Result Date: 11/18/2020 CLINICAL DATA:  Lung cancer, ongoing oral chemotherapy. EXAM: CT CHEST, ABDOMEN, AND PELVIS WITH CONTRAST TECHNIQUE: Multidetector CT imaging of the chest, abdomen and pelvis was performed following the standard protocol during bolus administration of intravenous contrast. CONTRAST:  722mOMNIPAQUE IOHEXOL 350 MG/ML SOLN COMPARISON:  08/11/2020. FINDINGS: CT CHEST FINDINGS Cardiovascular: Atherosclerotic calcification of the aorta and aortic valve. Coronary artery calcification, including the left main  coronary artery. Heart is at the upper limits of normal in size to mildly enlarged. No pericardial effusion. Mediastinum/Nodes: Low internal jugular lymph nodes are not enlarged by CT size criteria. Millimetric low-attenuation lesions in the thyroid. No follow-up recommended. (Ref: J Am Coll Radiol. 2015 Feb;12(2): 143-50).Mediastinal lymph nodes are not enlarged by CT size criteria. No hilar lymph nodes. Axillary lymph nodes are subcentimeter in  short axis size, as before. Esophagus is grossly unremarkable. Lungs/Pleura: Mild biapical pleuroparenchymal scarring. Mild central bronchiectasis. 3 mm peripheral right lower lobe nodule (6/110), stable. Other tiny right lower lobe nodule seen on 08/11/2020 is not appreciated. Nodular consolidation in medial left upper lobe has decreased in size, now measuring 1.9 x 1.9 cm (6/77), compared to 2.2 x 2.9 cm on 08/11/2020. Associated volume loss in the left upper lobe. Scarring in the right middle lobe and inferior lingula. Additional linear scarring in the subpleural lateral left lower lobe. No pleural fluid. Airway is unremarkable. Musculoskeletal: Rounded sclerosis in the inferior T5 vertebral body, similar. New area of focal sclerosis in the posterior aspect of the T5 vertebral body (4/84). CT ABDOMEN PELVIS FINDINGS Hepatobiliary: Subcentimeter low-attenuation lesions in the liver, similar and likely cysts. Liver and gallbladder are otherwise unremarkable. No biliary ductal dilatation. Pancreas: 10 mm low-attenuation lesion in the pancreatic tail, similar (2/67). Spleen: Millimetric low-attenuation lesions in the spleen, too small to characterize. Adrenals/Urinary Tract: Slight nodular thickening of the lateral limb right adrenal gland. Left adrenal gland is unremarkable. Low-attenuation lesions in the kidneys measure up to 6.3 cm on the left with the largest lesion showing mild peripheral calcification. Findings are indicative of a combination of simple and minimally complex cysts. Subcentimeter lesions are too small to definitively characterize. Ureters are decompressed. Bladder is grossly unremarkable. Stomach/Bowel: Stomach, small bowel and colon are unremarkable. Appendix is not readily visualized. Vascular/Lymphatic: Atherosclerotic calcification of the aorta. Scattered lymph nodes are not enlarged by CT size criteria. Reproductive: Hysterectomy.  No adnexal mass. Other: Trace pelvic free fluid.  Musculoskeletal: Rounded sclerotic lesions are seen in the L1 and L3 vertebral bodies as well as medial left iliac wing, as before. Dense sclerotic lesion in the lateral right iliac wing, unchanged. No new lesions. IMPRESSION: 1. Left upper lobe nodule continues to decrease in size. 3 mm right lower lobe nodule is unchanged. A second tiny nodule seen in the right lower lobe on 08/11/2020 is no longer appreciated. 2. New sclerotic lesion in the T5 vertebral body. Other osseous metastases are unchanged. 3. 10 mm low-attenuation lesion in the pancreatic tail, unchanged. Continued attention on follow-up is recommended. 4. Aortic atherosclerosis (ICD10-I70.0). Coronary artery calcification, including the left main coronary artery. Electronically Signed   By: Lorin Picket M.D.   On: 11/18/2020 14:56     ASSESSMENT AND PLAN: This is a very pleasant 87 years old white female recently diagnosed with a stage IV (T3, N3, M1 C) non-small cell lung cancer, adenocarcinoma with positive EGFR mutation with deletion in exon 19 diagnosed in December 2021 and presented with left lower lobe lung mass in addition to right supraclavicular, left hilar and subcarinal lymphadenopathy as well as metastatic lymphadenopathy in the abdomen and left pleural-based metastasis as well as malignant left pleural effusion and multiple brain metastasis. The molecular study showed positive EGFR mutation with deletion in exon 19. The patient started treatment with Tagrisso on June 10, 2020.  Status post 5.5 months of treatment. The patient continues to tolerate her treatment fairly well with no concerning adverse effect except  for mild fatigue and lack of appetite. She had repeat CT scan of the chest, abdomen pelvis performed recently.  I personally and independently reviewed the scan images and discussed the results with the patient today. Her scan showed continuous improvement of the left upper lobe lung nodule as well as stable disease  except for a new sclerotic lesion in the T5 vertebral body which could be a healing process from the treatment with Tagrisso.  The patient is asymptomatic in this area and if she becomes symptomatic, I will consider referring her to radiation oncology for palliative radiotherapy to this lesion. I recommended for her to continue her current treatment with Tagrisso with the same dose. I will see her back for follow-up visit in 6 weeks for evaluation and repeat blood work. For the lack of appetite she was encouraged to increase her oral intake and eat more frequent meals.  I offered her treatment with Medrol Dosepak but she declined at this point. She was advised to call immediately if she has any other concerning symptoms in the interval. The patient voices understanding of current disease status and treatment options and is in agreement with the current care plan.  All questions were answered. The patient knows to call the clinic with any problems, questions or concerns. We can certainly see the patient much sooner if necessary.   Disclaimer: This note was dictated with voice recognition software. Similar sounding words can inadvertently be transcribed and may not be corrected upon review.

## 2020-11-27 ENCOUNTER — Other Ambulatory Visit (HOSPITAL_COMMUNITY): Payer: Self-pay

## 2020-12-01 ENCOUNTER — Other Ambulatory Visit (HOSPITAL_COMMUNITY): Payer: Self-pay

## 2020-12-04 ENCOUNTER — Other Ambulatory Visit (HOSPITAL_COMMUNITY): Payer: Self-pay

## 2020-12-06 ENCOUNTER — Other Ambulatory Visit (HOSPITAL_COMMUNITY): Payer: Self-pay

## 2020-12-08 DIAGNOSIS — H5213 Myopia, bilateral: Secondary | ICD-10-CM | POA: Diagnosis not present

## 2020-12-08 DIAGNOSIS — Z961 Presence of intraocular lens: Secondary | ICD-10-CM | POA: Diagnosis not present

## 2020-12-15 DIAGNOSIS — L602 Onychogryphosis: Secondary | ICD-10-CM | POA: Diagnosis not present

## 2020-12-15 DIAGNOSIS — L600 Ingrowing nail: Secondary | ICD-10-CM | POA: Diagnosis not present

## 2020-12-15 DIAGNOSIS — M2012 Hallux valgus (acquired), left foot: Secondary | ICD-10-CM | POA: Diagnosis not present

## 2020-12-25 ENCOUNTER — Other Ambulatory Visit (HOSPITAL_COMMUNITY): Payer: Self-pay

## 2020-12-28 ENCOUNTER — Other Ambulatory Visit (HOSPITAL_COMMUNITY): Payer: Self-pay

## 2021-01-02 ENCOUNTER — Inpatient Hospital Stay: Payer: Medicare PPO

## 2021-01-02 ENCOUNTER — Other Ambulatory Visit: Payer: Self-pay

## 2021-01-02 ENCOUNTER — Inpatient Hospital Stay: Payer: Medicare PPO | Attending: Physician Assistant | Admitting: Internal Medicine

## 2021-01-02 VITALS — BP 139/58 | HR 73 | Temp 97.5°F | Resp 16 | Ht 64.0 in | Wt 103.4 lb

## 2021-01-02 DIAGNOSIS — J910 Malignant pleural effusion: Secondary | ICD-10-CM

## 2021-01-02 DIAGNOSIS — Z7982 Long term (current) use of aspirin: Secondary | ICD-10-CM | POA: Diagnosis not present

## 2021-01-02 DIAGNOSIS — C779 Secondary and unspecified malignant neoplasm of lymph node, unspecified: Secondary | ICD-10-CM | POA: Insufficient documentation

## 2021-01-02 DIAGNOSIS — R5383 Other fatigue: Secondary | ICD-10-CM | POA: Insufficient documentation

## 2021-01-02 DIAGNOSIS — R21 Rash and other nonspecific skin eruption: Secondary | ICD-10-CM | POA: Diagnosis not present

## 2021-01-02 DIAGNOSIS — C3412 Malignant neoplasm of upper lobe, left bronchus or lung: Secondary | ICD-10-CM

## 2021-01-02 DIAGNOSIS — C7951 Secondary malignant neoplasm of bone: Secondary | ICD-10-CM | POA: Diagnosis not present

## 2021-01-02 DIAGNOSIS — I10 Essential (primary) hypertension: Secondary | ICD-10-CM | POA: Diagnosis not present

## 2021-01-02 DIAGNOSIS — R630 Anorexia: Secondary | ICD-10-CM | POA: Insufficient documentation

## 2021-01-02 DIAGNOSIS — C7931 Secondary malignant neoplasm of brain: Secondary | ICD-10-CM | POA: Diagnosis not present

## 2021-01-02 DIAGNOSIS — C3492 Malignant neoplasm of unspecified part of left bronchus or lung: Secondary | ICD-10-CM | POA: Diagnosis not present

## 2021-01-02 DIAGNOSIS — R197 Diarrhea, unspecified: Secondary | ICD-10-CM | POA: Insufficient documentation

## 2021-01-02 DIAGNOSIS — C3490 Malignant neoplasm of unspecified part of unspecified bronchus or lung: Secondary | ICD-10-CM

## 2021-01-02 DIAGNOSIS — Z1501 Genetic susceptibility to malignant neoplasm of breast: Secondary | ICD-10-CM | POA: Insufficient documentation

## 2021-01-02 DIAGNOSIS — Z79899 Other long term (current) drug therapy: Secondary | ICD-10-CM | POA: Diagnosis not present

## 2021-01-02 DIAGNOSIS — Z5111 Encounter for antineoplastic chemotherapy: Secondary | ICD-10-CM

## 2021-01-02 LAB — CBC WITH DIFFERENTIAL (CANCER CENTER ONLY)
Abs Immature Granulocytes: 0.02 10*3/uL (ref 0.00–0.07)
Basophils Absolute: 0 10*3/uL (ref 0.0–0.1)
Basophils Relative: 1 %
Eosinophils Absolute: 0.3 10*3/uL (ref 0.0–0.5)
Eosinophils Relative: 4 %
HCT: 35 % — ABNORMAL LOW (ref 36.0–46.0)
Hemoglobin: 11.4 g/dL — ABNORMAL LOW (ref 12.0–15.0)
Immature Granulocytes: 0 %
Lymphocytes Relative: 16 %
Lymphs Abs: 1 10*3/uL (ref 0.7–4.0)
MCH: 30.8 pg (ref 26.0–34.0)
MCHC: 32.6 g/dL (ref 30.0–36.0)
MCV: 94.6 fL (ref 80.0–100.0)
Monocytes Absolute: 0.7 10*3/uL (ref 0.1–1.0)
Monocytes Relative: 10 %
Neutro Abs: 4.4 10*3/uL (ref 1.7–7.7)
Neutrophils Relative %: 69 %
Platelet Count: 159 10*3/uL (ref 150–400)
RBC: 3.7 MIL/uL — ABNORMAL LOW (ref 3.87–5.11)
RDW: 15.5 % (ref 11.5–15.5)
WBC Count: 6.4 10*3/uL (ref 4.0–10.5)
nRBC: 0 % (ref 0.0–0.2)

## 2021-01-02 LAB — CMP (CANCER CENTER ONLY)
ALT: 10 U/L (ref 0–44)
AST: 17 U/L (ref 15–41)
Albumin: 4.1 g/dL (ref 3.5–5.0)
Alkaline Phosphatase: 47 U/L (ref 38–126)
Anion gap: 8 (ref 5–15)
BUN: 38 mg/dL — ABNORMAL HIGH (ref 8–23)
CO2: 26 mmol/L (ref 22–32)
Calcium: 11 mg/dL — ABNORMAL HIGH (ref 8.9–10.3)
Chloride: 104 mmol/L (ref 98–111)
Creatinine: 1.46 mg/dL — ABNORMAL HIGH (ref 0.44–1.00)
GFR, Estimated: 35 mL/min — ABNORMAL LOW (ref >60)
Glucose, Bld: 102 mg/dL — ABNORMAL HIGH (ref 70–99)
Potassium: 4.9 mmol/L (ref 3.5–5.1)
Sodium: 138 mmol/L (ref 135–145)
Total Bilirubin: 0.4 mg/dL (ref 0.3–1.2)
Total Protein: 7 g/dL (ref 6.5–8.1)

## 2021-01-02 NOTE — Progress Notes (Signed)
[place] [phone]   [phone]  OFFICE PROGRESS NOTE  [name], MD [place] [address]  DIAGNOSIS: Stage IV (T2b, N2, M1c), non-small cell lung cancer, adenocarcinoma.  The patient presented with a left upper lobe mass, right supra clavicular, left hilar and subcarinal lymphadenopathy as well as diffuse left-sided pleural disease with associated malignant left pleural effusion and 2 small lymph nodes near the SMA in addition to multiple brain metastasis diagnosed in December 2021.   Molecular studies by [facility] showed positive EGFR mutation with deletion in exon 19  PRIOR THERAPY: None  CURRENT THERAPY: Tagrisso 80 mg p.o. daily. First dose June 10, 2020.  Status post 7 months of treatment  INTERVAL HISTORY: Shelley Flores 87 y.o. female returns to the clinic today for follow-up visit.  The patient is feeling fine today with no concerning complaints except for the persistent fatigue and no weight gain.  She denied having any current chest pain, shortness of breath, cough or hemoptysis.  She denied having any fever or chills.  She has no nausea, vomiting, diarrhea or constipation.  She has no headache or visual changes.  She has no significant weight loss or night sweats.  She continues to tolerate her treatment with Tagrisso fairly well except for a small area of skin rash on the left elbow.  She is here today for evaluation and repeat blood work.   MEDICAL HISTORY: Past Medical History:  Diagnosis Date   Bruit    Abdominal bruit - Abdominal aorta/renal duplex Doppler evaluation 12/07/03 -Mildly abnormal evaluation. 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 This is consistant w/median arcuate ligament compression syndrome. *Right & Left Kidney: Essentially equal in size, symmetrical in shape w/no significant abnormalities visualized. *Right & Left Renal Arteries: No significant abnormalities.    Cancer ([facility])    Edema extremities    LE edema    H/O myocardial perfusion scan 02/20/00   To rule out ischemia - Negative adequate Bruce protocol exercise stress test with a deconditioned exercise response and normal static myocardial perfusion images with EF calculated by QGS of 77%. Represents a low risk study.   Hyperlipidemia    Hypertension    Osteoarthritis    Osteoporosis    Pleural effusion 04/2020   Valvular heart disease    Mild valvular heart disease by Echo in 2010, all mild and symptomatic, including concentric LVH, MR, TR, and AI with pulmonary artery pressure of 32. EF was normal.    ALLERGIES:  is allergic to irbesartan.  MEDICATIONS:  Current Outpatient Medications  Medication Sig Dispense Refill   acetaminophen (TYLENOL) 325 MG tablet Take 2 tablets (650 mg total) by mouth every 6 (six) hours as needed for mild pain (or Fever >/= 101). 20 tablet 0   amLODipine (NORVASC) 2.5 MG tablet TAKE 1 TABLET BY MOUTH EVERY DAY 90 tablet 1   aspirin 81 MG tablet Take 81 mg by mouth daily.     atorvastatin (LIPITOR) 20 MG tablet Take 1 tablet (20 mg total) by mouth at bedtime. 90 tablet 1   loperamide (IMODIUM A-D) 2 MG capsule Take by mouth as needed for diarrhea or loose stools.     metoprolol succinate (TOPROL-XL) 50 MG 24 hr tablet Take 50 mg by mouth daily. Take with or immediately following a meal.     Multiple Vitamin (MULTIVITAMIN) tablet Take 1 tablet by mouth daily.     osimertinib mesylate (  TAGRISSO) 80 MG tablet Take 1 tablet (80 mg total) by mouth daily. 30 tablet 3   spironolactone (ALDACTONE) 25 MG tablet Take 1 tablet (25 mg total) by mouth daily. 90 tablet 3   zinc oxide 20 % ointment Apply 1 application topically as needed for irritation. apply to buttocks/peri, topical, As Needed, To buttocks after every incontinent episode and as needed for redness. May keep at bedside.     No current facility-administered medications for this visit.    SURGICAL HISTORY:   Past Surgical History:  Procedure Laterality Date   Abdominal aorta/Renal duplex Doppler Evaluation  12/07/03   For abdominal bruit. Mildly abnormal evaluation. (See bruit in medical history)   BREAST EXCISIONAL BIOPSY Left 1966   BREAST EXCISIONAL BIOPSY Left 1999   CATARACT EXTRACTION  2003   CHEST TUBE INSERTION N/A 07/04/2020   Procedure: REMOVAL PLEURAL DRAINAGE CATHETER;  Surgeon: Garner Nash, DO;  Location: [facility];  Service: Pulmonary;  Laterality: N/A;   COLONOSCOPY  10/22/2004   DEXA Bone Scan  06/23/2013   IR PERC PLEURAL DRAIN W/INDWELL CATH W/IMG GUIDE  05/10/2020   IR THORACENTESIS ASP PLEURAL SPACE W/IMG GUIDE  05/04/2020    REVIEW OF SYSTEMS:  A comprehensive review of systems was negative except for: Constitutional: positive for anorexia and fatigue   PHYSICAL EXAMINATION: General appearance: alert, cooperative, fatigued, and no distress Head: Normocephalic, without obvious abnormality, atraumatic Neck: no adenopathy, no JVD, supple, symmetrical, trachea midline, and thyroid not enlarged, symmetric, no tenderness/mass/nodules Lymph nodes: Cervical, supraclavicular, and axillary nodes normal. Resp: clear to auscultation bilaterally Back: symmetric, no curvature. ROM normal. No CVA tenderness. Cardio: regular rate and rhythm, S1, S2 normal, no murmur, click, rub or gallop GI: soft, non-tender; bowel sounds normal; no masses,  no organomegaly Extremities: extremities normal, atraumatic, no cyanosis or edema  ECOG PERFORMANCE STATUS: 1 - Symptomatic but completely ambulatory  Blood pressure (!) 139/58, pulse 73, temperature (!) 97.5 F (36.4 C), temperature source Tympanic, resp. rate 16, height '5\' 4"'  (1.626 m), weight 103 lb 6.4 oz (46.9 kg), SpO2 100 %.  LABORATORY DATA: Lab Results  Component Value Date   WBC 6.4 01/02/2021   HGB 11.4 (L) 01/02/2021   HCT 35.0 (L) 01/02/2021   MCV 94.6 01/02/2021   PLT 159 01/02/2021      Chemistry      Component  Value Date/Time   NA 139 11/17/2020 1127   NA 137 05/25/2020 0000   K 4.5 11/17/2020 1127   CL 105 11/17/2020 1127   CO2 27 11/17/2020 1127   BUN 29 (H) 11/17/2020 1127   BUN 12 05/25/2020 0000   CREATININE 1.42 (H) 11/17/2020 1127   GLU 83 05/25/2020 0000      Component Value Date/Time   CALCIUM 10.9 (H) 11/17/2020 1127   ALKPHOS 45 11/17/2020 1127   AST 18 11/17/2020 1127   ALT 14 11/17/2020 1127   BILITOT 0.4 11/17/2020 1127       RADIOGRAPHIC STUDIES: No results found.   ASSESSMENT AND PLAN: This is a very pleasant 87 years old white female recently diagnosed with a stage IV (T3, N3, M1 C) non-small cell lung cancer, adenocarcinoma with positive EGFR mutation with deletion in exon 19 diagnosed in December 2021 and presented with left lower lobe lung mass in addition to right supraclavicular, left hilar and subcarinal lymphadenopathy as well as metastatic lymphadenopathy in the abdomen and left pleural-based metastasis as well as malignant left pleural effusion and multiple brain metastasis. The  molecular study showed positive EGFR mutation with deletion in exon 19. The patient started treatment with Tagrisso on June 10, 2020.  Status post 7 months of treatment. The patient continues to tolerate her treatment well except for fatigue and mild skin rash.  Her diarrhea has improved. I recommended for her to continue her current treatment with Tagrisso with the same dose. I will see her back for follow-up visit in 6 weeks for evaluation with repeat CT scan of the chest, abdomen pelvis for restaging of her disease. For the skin rash, she will apply hydrocortisone cream to these areas. She was advised to call immediately if she has any concerning symptoms in the interval. The patient voices understanding of current disease status and treatment options and is in agreement with the current care plan.  All questions were answered. The patient knows to call the clinic with any  problems, questions or concerns. We can certainly see the patient much sooner if necessary.   Disclaimer: This note was dictated with voice recognition software. Similar sounding words can inadvertently be transcribed and may not be corrected upon review.

## 2021-01-18 DIAGNOSIS — M810 Age-related osteoporosis without current pathological fracture: Secondary | ICD-10-CM | POA: Diagnosis not present

## 2021-01-18 DIAGNOSIS — R2681 Unsteadiness on feet: Secondary | ICD-10-CM | POA: Diagnosis not present

## 2021-01-18 DIAGNOSIS — M6281 Muscle weakness (generalized): Secondary | ICD-10-CM | POA: Diagnosis not present

## 2021-01-18 DIAGNOSIS — Z9181 History of falling: Secondary | ICD-10-CM | POA: Diagnosis not present

## 2021-01-19 DIAGNOSIS — Z9181 History of falling: Secondary | ICD-10-CM | POA: Diagnosis not present

## 2021-01-19 DIAGNOSIS — M6281 Muscle weakness (generalized): Secondary | ICD-10-CM | POA: Diagnosis not present

## 2021-01-19 DIAGNOSIS — M810 Age-related osteoporosis without current pathological fracture: Secondary | ICD-10-CM | POA: Diagnosis not present

## 2021-01-19 DIAGNOSIS — R2681 Unsteadiness on feet: Secondary | ICD-10-CM | POA: Diagnosis not present

## 2021-01-22 DIAGNOSIS — M6281 Muscle weakness (generalized): Secondary | ICD-10-CM | POA: Diagnosis not present

## 2021-01-22 DIAGNOSIS — Z9181 History of falling: Secondary | ICD-10-CM | POA: Diagnosis not present

## 2021-01-22 DIAGNOSIS — R2681 Unsteadiness on feet: Secondary | ICD-10-CM | POA: Diagnosis not present

## 2021-01-22 DIAGNOSIS — M810 Age-related osteoporosis without current pathological fracture: Secondary | ICD-10-CM | POA: Diagnosis not present

## 2021-01-23 ENCOUNTER — Other Ambulatory Visit: Payer: Self-pay

## 2021-01-23 ENCOUNTER — Encounter: Payer: Medicare PPO | Admitting: Dietician

## 2021-01-23 ENCOUNTER — Ambulatory Visit: Payer: Medicare PPO | Admitting: Internal Medicine

## 2021-01-23 ENCOUNTER — Telehealth: Payer: Self-pay | Admitting: Dietician

## 2021-01-23 ENCOUNTER — Encounter: Payer: Self-pay | Admitting: Internal Medicine

## 2021-01-23 VITALS — BP 136/66 | HR 72 | Ht 64.0 in | Wt 102.2 lb

## 2021-01-23 DIAGNOSIS — R600 Localized edema: Secondary | ICD-10-CM | POA: Diagnosis not present

## 2021-01-23 DIAGNOSIS — E785 Hyperlipidemia, unspecified: Secondary | ICD-10-CM

## 2021-01-23 DIAGNOSIS — Z9181 History of falling: Secondary | ICD-10-CM | POA: Diagnosis not present

## 2021-01-23 DIAGNOSIS — M6281 Muscle weakness (generalized): Secondary | ICD-10-CM | POA: Diagnosis not present

## 2021-01-23 DIAGNOSIS — I10 Essential (primary) hypertension: Secondary | ICD-10-CM | POA: Diagnosis not present

## 2021-01-23 DIAGNOSIS — M810 Age-related osteoporosis without current pathological fracture: Secondary | ICD-10-CM | POA: Diagnosis not present

## 2021-01-23 DIAGNOSIS — R2681 Unsteadiness on feet: Secondary | ICD-10-CM | POA: Diagnosis not present

## 2021-01-23 NOTE — Progress Notes (Signed)
OFFICE NOTE  Chief Complaint:  Follow-up  Primary Care Physician: [name], MD  HPI:  Shelley Flores  Is a 87 year old female who comes in for followup. She has hypertension and hyperlipidemia and had had an echocardiogram in 2010 that showed mild concentric LVH, mild AI, mild MR/TR with pulmonary artery pressure of 32. Her EF was normal. She has no specific cardiac complaints. Blood pressure and cholesterol have been well-controlled. She is due for recheck of her lipid profile. Unfortunately she does not get much exercise but she is looking into it and possibly considering the [employer] program.  I saw [name] back in the office today. Overall she is doing well except for some leg swelling at the end of the day. Denies any chest pain or worsening shortness of breath. EKG looks unchanged.  02/26/2016  [name] returns today for follow-up. Over the past year she is done fairly well. She denies any chest pain or worsening shortness of breath. Initially blood pressure was elevated 158/78 however recheck came down to 130/70. She is on low-dose aspirin. She is due for recheck of her lipid profile. She has not had any worsening palpitations.   03/11/2017  [name] returns today for follow-up.  She is done well over the past year.  She is since moved into [facility] [place].  She is very happy there.  This is an assisted living facility.  She denies any chest pain or worsening shortness of breath.  Blood pressure is improved today.  She gets annual lipid profiles and is due for that as well.  She denies any problems with her medications.  She says she is sleeping well.  The only issue is she had some imbalance however she has been working with physical therapy on that.  03/18/2018  [name] is seen today in routine follow-up.  Overall she is doing well without new complaints.  She continues to live at [facility] [place].  She says she is not as active as she like to  be.  Weight has fluctuated some.  She denies any chest pain or shortness of breath.  EKG shows sinus rhythm.  Blood pressure is well controlled today.  03/24/2019  [name] is seen today in follow-up.  She denies any new chest pain or worsening shortness of breath.  Her most recent lipid profile showed total cholesterol 168, triglycerides 102, HDL 53 and LDL 95.  This is actually at target with goal LDL less than 100.  The study however was almost a year ago.  She remains on a atorvastatin 20 mg.  Blood pressure is significantly elevated today at 179/84.  She brought a list of blood pressure readings that were taken at [facility] indicating her blood pressure was 120/80 generally by manual cuff.  Her personal automatic cuff however shows a blood pressure to be about 539-767 systolic.  She also is complaining of some lower extremity edema, particularly pedal edema which is worsened recently and does not necessarily go away with elevating her feet at night.  03/24/2020  Shelley Flores returns today for follow-up.  She continues to struggle with lower extremity edema and I think suboptimal blood pressure control.  I had previously put her on some thiazide diuretic however she does not seem like she has had significant improvement with that.  Pressure here today was 152/84.  She says at home she gets around 341 systolic.  At [facility] clinic [facility] they get around 120 but she doubts  that number.  06/15/2020  Shelley Flores is seen today for follow-up.  Unfortunately she was recently hospitalized for a pleural effusion.  Subsequently after thoracentesis she was found to have pulmonary nodule with evidence for metastases.  She was diagnosed with adenocarcinoma.  There also appears to be some extension into the brain.  She is seeing Dr. Earlie Server for this.  She could not tolerate irbesartan but it may have been side effects related to this.  Nonetheless she is not been on any blood pressure medications and is  hypertensive today.  He continues to have issues with swelling is not responding to Lasix.  08/08/2020  Shelley Flores seen today for follow-up of hypertension.  After restarting her amlodipine blood pressure is much better controlled.  Today she is 127/66.  She did ask me to listen to her noted that she does have some dullness to percussion at the left base which may represent some persistent pleural effusion.  She does have follow-up with a CT scan in April 1 and sees the pulmonologist on April 4.  01/23/2021  Shelley Flores returns today for follow-up.  Blood pressure was good today.  She is concerned that her home blood pressure readings are low but actually systolic is not necessarily below 100.  She is on low-dose amlodipine.  Her cholesterol appears to be well controlled as of December with total 139, HDL 53, LDL 68 and triglycerides 28.  EKG today shows a normal sinus rhythm.  She reports that her tumors are shrinking somewhat.  She does report overall fatigue.  She was noted to have coronary calcification and atherosclerosis by CT but had not had previous reports of this.  PMHx:  Past Medical History:  Diagnosis Date   Bruit    Abdominal bruit - Abdominal aorta/renal duplex Doppler evaluation 12/07/03 -Mildly abnormal evaluation. 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 This is consistant w/median arcuate ligament compression syndrome. *Right & Left Kidney: Essentially equal in size, symmetrical in shape w/no significant abnormalities visualized. *Right & Left Renal Arteries: No significant abnormalities.   Cancer ([facility])    Edema extremities    LE edema    H/O myocardial perfusion scan 02/20/00   To rule out ischemia - Negative adequate Bruce protocol exercise stress test with a deconditioned exercise response and normal static myocardial perfusion images with EF calculated by QGS of 77%. Represents a low risk study.   Hyperlipidemia    Hypertension    Osteoarthritis    Osteoporosis     Pleural effusion 04/2020   Valvular heart disease    Mild valvular heart disease by Echo in 2010, all mild and symptomatic, including concentric LVH, MR, TR, and AI with pulmonary artery pressure of 32. EF was normal.    Past Surgical History:  Procedure Laterality Date   Abdominal aorta/Renal duplex Doppler Evaluation  12/07/03   For abdominal bruit. Mildly abnormal evaluation. (See bruit in medical history)   BREAST EXCISIONAL BIOPSY Left 1966   BREAST EXCISIONAL BIOPSY Left 1999   CATARACT EXTRACTION  2003   CHEST TUBE INSERTION N/A 07/04/2020   Procedure: REMOVAL PLEURAL DRAINAGE CATHETER;  Surgeon: Garner Nash, DO;  Location: [facility];  Service: Pulmonary;  Laterality: N/A;   COLONOSCOPY  10/22/2004   DEXA Bone Scan  06/23/2013   IR PERC PLEURAL DRAIN W/INDWELL CATH W/IMG GUIDE  05/10/2020   IR THORACENTESIS ASP PLEURAL SPACE W/IMG GUIDE  05/04/2020    FAMHx:  Family History  Problem Relation Age of Onset  Leukemia Mother 61   Cancer Father        esophagial cancer   Heart failure Maternal Grandmother    Tuberculosis Maternal Grandfather    Heart disease Paternal Grandmother    Cancer Paternal Grandfather     SOCHx:   reports that she quit smoking about 58 years ago. Her smoking use included cigarettes. She has a 1.50 pack-year smoking history. She has never used smokeless tobacco. She reports current alcohol use of about 2.0 standard drinks per week. She reports that she does not use drugs.  ALLERGIES:  Allergies  Allergen Reactions   Irbesartan Nausea Only    ROS: Pertinent items noted in HPI and remainder of comprehensive ROS otherwise negative.  HOME MEDS: Current Outpatient Medications  Medication Sig Dispense Refill   acetaminophen (TYLENOL) 325 MG tablet Take 2 tablets (650 mg total) by mouth every 6 (six) hours as needed for mild pain (or Fever >/= 101). 20 tablet 0   amLODipine (NORVASC) 2.5 MG tablet TAKE 1 TABLET BY MOUTH EVERY DAY 90 tablet 1    aspirin 81 MG tablet Take 81 mg by mouth daily.     atorvastatin (LIPITOR) 20 MG tablet Take 1 tablet (20 mg total) by mouth at bedtime. 90 tablet 1   loperamide (IMODIUM) 2 MG capsule Take by mouth as needed for diarrhea or loose stools.     metoprolol succinate (TOPROL-XL) 50 MG 24 hr tablet Take 50 mg by mouth daily. Take with or immediately following a meal.     Multiple Vitamin (MULTIVITAMIN) tablet Take 1 tablet by mouth daily.     osimertinib mesylate (TAGRISSO) 80 MG tablet Take 1 tablet (80 mg total) by mouth daily. 30 tablet 3   spironolactone (ALDACTONE) 25 MG tablet Take 1 tablet (25 mg total) by mouth daily. 90 tablet 3   zinc oxide 20 % ointment Apply 1 application topically as needed for irritation. apply to buttocks/peri, topical, As Needed, To buttocks after every incontinent episode and as needed for redness. May keep at bedside.     No current facility-administered medications for this visit.    LABS/IMAGING: No results found for this or any previous visit (from the past 48 hour(s)). No results found.  VITALS: BP 136/66   Pulse 72   Ht 5\' 4"  (1.626 m)   Wt 102 lb 3.2 oz (46.4 kg)   SpO2 99%   BMI 17.54 kg/m   EXAM: General appearance: alert and no distress Neck: no carotid bruit, no JVD, and thyroid not enlarged, symmetric, no tenderness/mass/nodules Lungs: clear to auscultation bilaterally Heart: regular rate and rhythm, S1, S2 normal, no murmur, click, rub or gallop Abdomen: soft, non-tender; bowel sounds normal; no masses,  no organomegaly Extremities: edema trace left and 1+ RLE Pulses: 2+ and symmetric Skin: Skin color, texture, turgor normal. No rashes or lesions Neurologic: Grossly normal Psych: Pleasant  EKG: Normal sinus rhythm at 72, low voltage QRS-personally reviewed  ASSESSMENT: Metastatic NSCLC/pleural effusion Hypertension Dyslipidemia Asymptomatic PVC's - no significant recurrence History of valvular heart disease.  Bilateral LE  edema  PLAN: 1.   Shelley Flores says she is improved somewhat and has had recent response of her tumors to chemotherapy.  Breathing has improved and chest pain is improved.  Blood pressure is better controlled.  Her cholesterol is at goal.  She does have coronary calcification/aortic atherosclerosis noted by CT and therefore will need to remain on a statin.  She also needs low-dose amlodipine to control blood pressure along  with her metoprolol and Aldactone.  Plan follow-up in 6 months or sooner as necessary  Pixie Casino, MD, [facility], [place] Director of the Advanced Lipid Disorders &  Cardiovascular Risk Reduction Clinic Diplomate of the American Board of Clinical Lipidology Attending Cardiologist  Direct Dial: [phone]  Fax: [phone]  Website:  [email or web address] Shelley Flores 01/23/2021, 11:40 AM

## 2021-01-23 NOTE — Telephone Encounter (Signed)
Nutrition  Patient with stage IV NSCLC metastatic to brain. She is receiving oral chemotherapy.   Spoke with patient briefly via telephone for nutrition follow-up this afternoon. Patient reports physical therapist currently present for home PT session. Patient reports she was seen by cardiologist this morning, says she weighed 103 lbs today. Weights reviewed and stable. Patient weighed 103 lb 6.4 oz on 8/23 and 104 lb 11.2 oz on 7/12. Patient states nothing much has changed, appetite and intake are the same. Patient appreciative of call. She is unsure of a good time to schedule telephone follow-up. Patient has contact information and encouraged to call as needed with nutrition questions or concerns.

## 2021-01-23 NOTE — Patient Instructions (Signed)
Medication Instructions:  Your physician recommends that you continue on your current medications as directed. Please refer to the Current Medication list given to you today.  *If you need a refill on your cardiac medications before your next appointment, please call your pharmacy*   Lab Work: FASTING LIPID PANEL in 3 months If you have labs (blood work) drawn today and your tests are completely normal, you will receive your results only by: [place] (if you have MyChart) OR A paper copy in the mail If you have any lab test that is abnormal or we need to change your treatment, we will call you to review the results.  Follow-Up: At [facility], you and your health needs are our priority.  As part of our continuing mission to provide you with exceptional heart care, we have created designated Provider Care Teams.  These Care Teams include your primary Cardiologist (physician) and Advanced Practice Providers (APPs -  Physician Assistants and Nurse Practitioners) who all work together to provide you with the care you need, when you need it.  We recommend signing up for the patient portal called "MyChart".  Sign up information is provided on this After Visit Summary.  MyChart is used to connect with patients for Virtual Visits (Telemedicine).  Patients are able to view lab/test results, encounter notes, upcoming appointments, etc.  Non-urgent messages can be sent to your provider as well.   To learn more about what you can do with MyChart, go to [email or web address].    Your next appointment:   6 month(s)  The format for your next appointment:   In Person  Provider:   You may see Pixie Casino, MD or one of the following Advanced Practice Providers on your designated Care Team:   Almyra Deforest, PA-C Fabian Sharp, PA-C or  Roby Lofts, [place]   Other Instructions

## 2021-01-26 ENCOUNTER — Other Ambulatory Visit (HOSPITAL_COMMUNITY): Payer: Self-pay

## 2021-01-26 ENCOUNTER — Other Ambulatory Visit: Payer: Self-pay | Admitting: Internal Medicine

## 2021-01-26 DIAGNOSIS — M6281 Muscle weakness (generalized): Secondary | ICD-10-CM | POA: Diagnosis not present

## 2021-01-26 DIAGNOSIS — C3492 Malignant neoplasm of unspecified part of left bronchus or lung: Secondary | ICD-10-CM

## 2021-01-26 DIAGNOSIS — M810 Age-related osteoporosis without current pathological fracture: Secondary | ICD-10-CM | POA: Diagnosis not present

## 2021-01-26 DIAGNOSIS — R2681 Unsteadiness on feet: Secondary | ICD-10-CM | POA: Diagnosis not present

## 2021-01-26 DIAGNOSIS — Z9181 History of falling: Secondary | ICD-10-CM | POA: Diagnosis not present

## 2021-01-29 ENCOUNTER — Encounter: Payer: Self-pay | Admitting: Internal Medicine

## 2021-01-29 ENCOUNTER — Other Ambulatory Visit (HOSPITAL_COMMUNITY): Payer: Self-pay

## 2021-01-29 DIAGNOSIS — M810 Age-related osteoporosis without current pathological fracture: Secondary | ICD-10-CM | POA: Diagnosis not present

## 2021-01-29 DIAGNOSIS — M6281 Muscle weakness (generalized): Secondary | ICD-10-CM | POA: Diagnosis not present

## 2021-01-29 DIAGNOSIS — Z9181 History of falling: Secondary | ICD-10-CM | POA: Diagnosis not present

## 2021-01-29 DIAGNOSIS — R2681 Unsteadiness on feet: Secondary | ICD-10-CM | POA: Diagnosis not present

## 2021-01-29 MED ORDER — OSIMERTINIB MESYLATE 80 MG PO TABS
80.0000 mg | ORAL_TABLET | Freq: Every day | ORAL | 3 refills | Status: DC
  Filled 2021-01-29: qty 30, 30d supply, fill #0

## 2021-01-31 ENCOUNTER — Other Ambulatory Visit (HOSPITAL_COMMUNITY): Payer: Self-pay

## 2021-01-31 DIAGNOSIS — M810 Age-related osteoporosis without current pathological fracture: Secondary | ICD-10-CM | POA: Diagnosis not present

## 2021-01-31 DIAGNOSIS — Z9181 History of falling: Secondary | ICD-10-CM | POA: Diagnosis not present

## 2021-01-31 DIAGNOSIS — R2681 Unsteadiness on feet: Secondary | ICD-10-CM | POA: Diagnosis not present

## 2021-01-31 DIAGNOSIS — M6281 Muscle weakness (generalized): Secondary | ICD-10-CM | POA: Diagnosis not present

## 2021-02-02 DIAGNOSIS — M6281 Muscle weakness (generalized): Secondary | ICD-10-CM | POA: Diagnosis not present

## 2021-02-02 DIAGNOSIS — M810 Age-related osteoporosis without current pathological fracture: Secondary | ICD-10-CM | POA: Diagnosis not present

## 2021-02-02 DIAGNOSIS — Z9181 History of falling: Secondary | ICD-10-CM | POA: Diagnosis not present

## 2021-02-02 DIAGNOSIS — R2681 Unsteadiness on feet: Secondary | ICD-10-CM | POA: Diagnosis not present

## 2021-02-05 ENCOUNTER — Telehealth: Payer: Self-pay

## 2021-02-05 DIAGNOSIS — R2681 Unsteadiness on feet: Secondary | ICD-10-CM | POA: Diagnosis not present

## 2021-02-05 DIAGNOSIS — M6281 Muscle weakness (generalized): Secondary | ICD-10-CM | POA: Diagnosis not present

## 2021-02-05 DIAGNOSIS — Z9181 History of falling: Secondary | ICD-10-CM | POA: Diagnosis not present

## 2021-02-05 DIAGNOSIS — M810 Age-related osteoporosis without current pathological fracture: Secondary | ICD-10-CM | POA: Diagnosis not present

## 2021-02-05 NOTE — Telephone Encounter (Signed)
Pt states she has historically not had to drink the contrast for the abd/pelvis CT scan and wants to know if she needs to drink it this time?  Pt doesn't feel she will be able to drink and keep all the liquid down.

## 2021-02-06 NOTE — Telephone Encounter (Signed)
Pt has been advised she can have the CT w/o contrast.

## 2021-02-07 DIAGNOSIS — Z9181 History of falling: Secondary | ICD-10-CM | POA: Diagnosis not present

## 2021-02-07 DIAGNOSIS — R2681 Unsteadiness on feet: Secondary | ICD-10-CM | POA: Diagnosis not present

## 2021-02-07 DIAGNOSIS — M6281 Muscle weakness (generalized): Secondary | ICD-10-CM | POA: Diagnosis not present

## 2021-02-07 DIAGNOSIS — M810 Age-related osteoporosis without current pathological fracture: Secondary | ICD-10-CM | POA: Diagnosis not present

## 2021-02-09 DIAGNOSIS — M6281 Muscle weakness (generalized): Secondary | ICD-10-CM | POA: Diagnosis not present

## 2021-02-09 DIAGNOSIS — M810 Age-related osteoporosis without current pathological fracture: Secondary | ICD-10-CM | POA: Diagnosis not present

## 2021-02-09 DIAGNOSIS — R2681 Unsteadiness on feet: Secondary | ICD-10-CM | POA: Diagnosis not present

## 2021-02-09 DIAGNOSIS — Z9181 History of falling: Secondary | ICD-10-CM | POA: Diagnosis not present

## 2021-02-12 ENCOUNTER — Inpatient Hospital Stay: Payer: Medicare PPO | Attending: Physician Assistant

## 2021-02-12 ENCOUNTER — Other Ambulatory Visit: Payer: Self-pay

## 2021-02-12 ENCOUNTER — Other Ambulatory Visit: Payer: Medicare PPO

## 2021-02-12 ENCOUNTER — Ambulatory Visit (HOSPITAL_COMMUNITY)
Admission: RE | Admit: 2021-02-12 | Discharge: 2021-02-12 | Disposition: A | Discharge status: Home / Self Care | Payer: Medicare PPO | Source: Ambulatory Visit | Attending: Internal Medicine | Admitting: Internal Medicine

## 2021-02-12 DIAGNOSIS — C3490 Malignant neoplasm of unspecified part of unspecified bronchus or lung: Secondary | ICD-10-CM | POA: Diagnosis not present

## 2021-02-12 DIAGNOSIS — C7931 Secondary malignant neoplasm of brain: Secondary | ICD-10-CM | POA: Insufficient documentation

## 2021-02-12 DIAGNOSIS — R5383 Other fatigue: Secondary | ICD-10-CM | POA: Insufficient documentation

## 2021-02-12 DIAGNOSIS — Z7982 Long term (current) use of aspirin: Secondary | ICD-10-CM | POA: Insufficient documentation

## 2021-02-12 DIAGNOSIS — R197 Diarrhea, unspecified: Secondary | ICD-10-CM | POA: Diagnosis not present

## 2021-02-12 DIAGNOSIS — R630 Anorexia: Secondary | ICD-10-CM | POA: Insufficient documentation

## 2021-02-12 DIAGNOSIS — R21 Rash and other nonspecific skin eruption: Secondary | ICD-10-CM | POA: Insufficient documentation

## 2021-02-12 DIAGNOSIS — C779 Secondary and unspecified malignant neoplasm of lymph node, unspecified: Secondary | ICD-10-CM | POA: Diagnosis not present

## 2021-02-12 DIAGNOSIS — N281 Cyst of kidney, acquired: Secondary | ICD-10-CM | POA: Diagnosis not present

## 2021-02-12 DIAGNOSIS — I700 Atherosclerosis of aorta: Secondary | ICD-10-CM | POA: Diagnosis not present

## 2021-02-12 DIAGNOSIS — K7689 Other specified diseases of liver: Secondary | ICD-10-CM | POA: Diagnosis not present

## 2021-02-12 DIAGNOSIS — C7951 Secondary malignant neoplasm of bone: Secondary | ICD-10-CM | POA: Insufficient documentation

## 2021-02-12 DIAGNOSIS — Z1501 Genetic susceptibility to malignant neoplasm of breast: Secondary | ICD-10-CM | POA: Insufficient documentation

## 2021-02-12 DIAGNOSIS — I10 Essential (primary) hypertension: Secondary | ICD-10-CM | POA: Diagnosis not present

## 2021-02-12 DIAGNOSIS — Z79899 Other long term (current) drug therapy: Secondary | ICD-10-CM | POA: Insufficient documentation

## 2021-02-12 DIAGNOSIS — R911 Solitary pulmonary nodule: Secondary | ICD-10-CM | POA: Diagnosis not present

## 2021-02-12 DIAGNOSIS — I358 Other nonrheumatic aortic valve disorders: Secondary | ICD-10-CM | POA: Diagnosis not present

## 2021-02-12 DIAGNOSIS — C3412 Malignant neoplasm of upper lobe, left bronchus or lung: Secondary | ICD-10-CM | POA: Insufficient documentation

## 2021-02-12 DIAGNOSIS — I2510 Atherosclerotic heart disease of native coronary artery without angina pectoris: Secondary | ICD-10-CM | POA: Diagnosis not present

## 2021-02-12 LAB — CBC WITH DIFFERENTIAL (CANCER CENTER ONLY)
Abs Immature Granulocytes: 0.01 10*3/uL (ref 0.00–0.07)
Basophils Absolute: 0 10*3/uL (ref 0.0–0.1)
Basophils Relative: 0 %
Eosinophils Absolute: 0.2 10*3/uL (ref 0.0–0.5)
Eosinophils Relative: 4 %
HCT: 35.2 % — ABNORMAL LOW (ref 36.0–46.0)
Hemoglobin: 11.8 g/dL — ABNORMAL LOW (ref 12.0–15.0)
Immature Granulocytes: 0 %
Lymphocytes Relative: 14 %
Lymphs Abs: 0.9 10*3/uL (ref 0.7–4.0)
MCH: 31.4 pg (ref 26.0–34.0)
MCHC: 33.5 g/dL (ref 30.0–36.0)
MCV: 93.6 fL (ref 80.0–100.0)
Monocytes Absolute: 0.5 10*3/uL (ref 0.1–1.0)
Monocytes Relative: 8 %
Neutro Abs: 4.7 10*3/uL (ref 1.7–7.7)
Neutrophils Relative %: 74 %
Platelet Count: 155 10*3/uL (ref 150–400)
RBC: 3.76 MIL/uL — ABNORMAL LOW (ref 3.87–5.11)
RDW: 15.4 % (ref 11.5–15.5)
WBC Count: 6.3 10*3/uL (ref 4.0–10.5)
nRBC: 0 % (ref 0.0–0.2)

## 2021-02-12 LAB — CMP (CANCER CENTER ONLY)
ALT: 15 U/L (ref 0–44)
AST: 20 U/L (ref 15–41)
Albumin: 4.3 g/dL (ref 3.5–5.0)
Alkaline Phosphatase: 51 U/L (ref 38–126)
Anion gap: 9 (ref 5–15)
BUN: 38 mg/dL — ABNORMAL HIGH (ref 8–23)
CO2: 25 mmol/L (ref 22–32)
Calcium: 11.1 mg/dL — ABNORMAL HIGH (ref 8.9–10.3)
Chloride: 105 mmol/L (ref 98–111)
Creatinine: 1.39 mg/dL — ABNORMAL HIGH (ref 0.44–1.00)
GFR, Estimated: 37 mL/min — ABNORMAL LOW (ref >60)
Glucose, Bld: 107 mg/dL — ABNORMAL HIGH (ref 70–99)
Potassium: 4.8 mmol/L (ref 3.5–5.1)
Sodium: 139 mmol/L (ref 135–145)
Total Bilirubin: 0.4 mg/dL (ref 0.3–1.2)
Total Protein: 7.4 g/dL (ref 6.5–8.1)

## 2021-02-12 MED ORDER — IOHEXOL 350 MG/ML SOLN
60.0000 mL | Freq: Once | INTRAVENOUS | Status: AC | PRN
  Administered 2021-02-12: 60 mL via INTRAVENOUS

## 2021-02-13 DIAGNOSIS — Z9181 History of falling: Secondary | ICD-10-CM | POA: Diagnosis not present

## 2021-02-13 DIAGNOSIS — M6281 Muscle weakness (generalized): Secondary | ICD-10-CM | POA: Diagnosis not present

## 2021-02-13 DIAGNOSIS — R2681 Unsteadiness on feet: Secondary | ICD-10-CM | POA: Diagnosis not present

## 2021-02-14 ENCOUNTER — Other Ambulatory Visit (HOSPITAL_COMMUNITY): Payer: Self-pay

## 2021-02-14 ENCOUNTER — Inpatient Hospital Stay: Payer: Medicare PPO | Admitting: Internal Medicine

## 2021-02-14 ENCOUNTER — Other Ambulatory Visit: Payer: Self-pay

## 2021-02-14 VITALS — BP 138/64 | HR 92 | Temp 98.0°F | Resp 19 | Ht 64.0 in | Wt 100.5 lb

## 2021-02-14 DIAGNOSIS — C7951 Secondary malignant neoplasm of bone: Secondary | ICD-10-CM | POA: Diagnosis not present

## 2021-02-14 DIAGNOSIS — C779 Secondary and unspecified malignant neoplasm of lymph node, unspecified: Secondary | ICD-10-CM | POA: Diagnosis not present

## 2021-02-14 DIAGNOSIS — I10 Essential (primary) hypertension: Secondary | ICD-10-CM | POA: Diagnosis not present

## 2021-02-14 DIAGNOSIS — Z5111 Encounter for antineoplastic chemotherapy: Secondary | ICD-10-CM

## 2021-02-14 DIAGNOSIS — C3492 Malignant neoplasm of unspecified part of left bronchus or lung: Secondary | ICD-10-CM | POA: Diagnosis not present

## 2021-02-14 DIAGNOSIS — Z7982 Long term (current) use of aspirin: Secondary | ICD-10-CM | POA: Diagnosis not present

## 2021-02-14 DIAGNOSIS — Z79899 Other long term (current) drug therapy: Secondary | ICD-10-CM | POA: Diagnosis not present

## 2021-02-14 DIAGNOSIS — C7931 Secondary malignant neoplasm of brain: Secondary | ICD-10-CM | POA: Diagnosis not present

## 2021-02-14 DIAGNOSIS — Z1501 Genetic susceptibility to malignant neoplasm of breast: Secondary | ICD-10-CM | POA: Diagnosis not present

## 2021-02-14 DIAGNOSIS — R630 Anorexia: Secondary | ICD-10-CM | POA: Diagnosis not present

## 2021-02-14 DIAGNOSIS — C3412 Malignant neoplasm of upper lobe, left bronchus or lung: Secondary | ICD-10-CM | POA: Diagnosis not present

## 2021-02-14 MED ORDER — OSIMERTINIB MESYLATE 40 MG PO TABS
80.0000 mg | ORAL_TABLET | Freq: Every day | ORAL | 3 refills | Status: DC
  Filled 2021-02-14: qty 30, 15d supply, fill #0

## 2021-02-14 MED ORDER — OSIMERTINIB MESYLATE 40 MG PO TABS
40.0000 mg | ORAL_TABLET | Freq: Every day | ORAL | 3 refills | Status: DC
  Filled 2021-02-14 – 2021-02-15 (×2): qty 30, 30d supply, fill #0
  Filled 2021-03-07: qty 30, 30d supply, fill #1
  Filled 2021-04-03: qty 30, 30d supply, fill #2
  Filled 2021-05-08: qty 30, 30d supply, fill #3

## 2021-02-14 MED ORDER — METHYLPREDNISOLONE 4 MG PO TBPK
ORAL_TABLET | ORAL | 0 refills | Status: DC
  Filled 2021-02-14: qty 21, 6d supply, fill #0

## 2021-02-14 NOTE — Progress Notes (Signed)
[place] Telephone:([phone]) [phone]   Fax:([phone]) [phone]  OFFICE PROGRESS NOTE  Shelley Dad, MD [place] [address]  DIAGNOSIS: Stage IV (T2b, N2, M1c), non-small cell lung cancer, adenocarcinoma.  The patient presented with a left upper lobe mass, right supra clavicular, left hilar and subcarinal lymphadenopathy as well as diffuse left-sided pleural disease with associated malignant left pleural effusion and 2 small lymph nodes near the SMA in addition to multiple brain metastasis diagnosed in December 2021.   Molecular studies by [facility] showed positive EGFR mutation with deletion in exon 19  PRIOR THERAPY: None  CURRENT THERAPY: Tagrisso 80 mg p.o. daily. First dose June 10, 2020.  Status post 8 months of treatment.  Starting October 2022 the patient will be treated with Tagrisso 40 mg p.o. daily because of the intolerance.  INTERVAL HISTORY: Shelley Flores 87 y.o. female returns to the clinic today for follow-up visit.  The patient is feeling fine today with no concerning complaints except for few side effects from the treatment with Tagrisso including skin rash with few areas in the scalp as well as changes in the nails of the finger and toes.  She also has conjunctivitis recently with blepharitis.  She was seen by her ophthalmologist and the local treatment was not very effective. She has intermittent pain on the left side of the chest but no significant shortness of breath, cough or hemoptysis.  She denied having any fever or chills.  She has no nausea, vomiting, diarrhea or constipation.  She has no headache or visual changes.  She had repeat CT scan of the chest, abdomen pelvis performed recently and she is here for evaluation and discussion of her scan results.   MEDICAL HISTORY: Past Medical History:  Diagnosis Date   Bruit    Abdominal bruit - Abdominal aorta/renal duplex Doppler evaluation 12/07/03 -Mildly abnormal  evaluation. 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 This is consistant w/median arcuate ligament compression syndrome. *Right & Left Kidney: Essentially equal in size, symmetrical in shape w/no significant abnormalities visualized. *Right & Left Renal Arteries: No significant abnormalities.   Cancer ([facility])    Edema extremities    LE edema    H/O myocardial perfusion scan 02/20/00   To rule out ischemia - Negative adequate Bruce protocol exercise stress test with a deconditioned exercise response and normal static myocardial perfusion images with EF calculated by QGS of 77%. Represents a low risk study.   Hyperlipidemia    Hypertension    Osteoarthritis    Osteoporosis    Pleural effusion 04/2020   Valvular heart disease    Mild valvular heart disease by Echo in 2010, all mild and symptomatic, including concentric LVH, MR, TR, and AI with pulmonary artery pressure of 32. EF was normal.    ALLERGIES:  is allergic to irbesartan.  MEDICATIONS:  Current Outpatient Medications  Medication Sig Dispense Refill   acetaminophen (TYLENOL) 325 MG tablet Take 2 tablets (650 mg total) by mouth every 6 (six) hours as needed for mild pain (or Fever >/= 101). 20 tablet 0   amLODipine (NORVASC) 2.5 MG tablet TAKE 1 TABLET BY MOUTH EVERY DAY 90 tablet 1   aspirin 81 MG tablet Take 81 mg by mouth daily.     atorvastatin (LIPITOR) 20 MG tablet Take 1 tablet (20 mg total) by mouth at bedtime. 90 tablet 1   loperamide (IMODIUM) 2 MG capsule Take by mouth as needed  for diarrhea or loose stools.     metoprolol succinate (TOPROL-XL) 50 MG 24 hr tablet Take 50 mg by mouth daily. Take with or immediately following a meal.     Multiple Vitamin (MULTIVITAMIN) tablet Take 1 tablet by mouth daily.     osimertinib mesylate (TAGRISSO) 80 MG tablet Take 1 tablet (80 mg total) by mouth daily. 30 tablet 3   spironolactone (ALDACTONE) 25 MG tablet Take 1 tablet (25 mg total) by mouth daily. 90 tablet 3   zinc  oxide 20 % ointment Apply 1 application topically as needed for irritation. apply to buttocks/peri, topical, As Needed, To buttocks after every incontinent episode and as needed for redness. May keep at bedside.     No current facility-administered medications for this visit.    SURGICAL HISTORY:  Past Surgical History:  Procedure Laterality Date   Abdominal aorta/Renal duplex Doppler Evaluation  12/07/03   For abdominal bruit. Mildly abnormal evaluation. (See bruit in medical history)   BREAST EXCISIONAL BIOPSY Left 1966   BREAST EXCISIONAL BIOPSY Left 1999   CATARACT EXTRACTION  2003   CHEST TUBE INSERTION N/A 07/04/2020   Procedure: REMOVAL PLEURAL DRAINAGE CATHETER;  Surgeon: Garner Nash, DO;  Location: [facility];  Service: Pulmonary;  Laterality: N/A;   COLONOSCOPY  10/22/2004   DEXA Bone Scan  06/23/2013   IR PERC PLEURAL DRAIN W/INDWELL CATH W/IMG GUIDE  05/10/2020   IR THORACENTESIS ASP PLEURAL SPACE W/IMG GUIDE  05/04/2020    REVIEW OF SYSTEMS:  Constitutional: positive for fatigue Eyes: positive for irritation and redness Ears, nose, mouth, throat, and face: negative Respiratory: positive for pleurisy/chest pain Cardiovascular: negative Gastrointestinal: negative Genitourinary:negative Integument/breast: positive for dryness and rash Hematologic/lymphatic: negative Musculoskeletal:negative Neurological: negative Behavioral/Psych: negative Endocrine: negative Allergic/Immunologic: negative   PHYSICAL EXAMINATION: General appearance: alert, cooperative, fatigued, and no distress Head: Normocephalic, without obvious abnormality, atraumatic Neck: no adenopathy, no JVD, supple, symmetrical, trachea midline, and thyroid not enlarged, symmetric, no tenderness/mass/nodules Lymph nodes: Cervical, supraclavicular, and axillary nodes normal. Resp: clear to auscultation bilaterally Back: symmetric, no curvature. ROM normal. No CVA tenderness. Cardio: regular rate and  rhythm, S1, S2 normal, no murmur, click, rub or gallop GI: soft, non-tender; bowel sounds normal; no masses,  no organomegaly Extremities: extremities normal, atraumatic, no cyanosis or edema Neurologic: Alert and oriented X 3, normal strength and tone. Normal symmetric reflexes. Normal coordination and gait    ECOG PERFORMANCE STATUS: 1 - Symptomatic but completely ambulatory  Blood pressure 138/64, pulse 92, temperature 98 F (36.7 C), temperature source Tympanic, resp. rate 19, height '5\' 4"'  (1.626 m), weight 100 lb 8 oz (45.6 kg), SpO2 100 %.  LABORATORY DATA: Lab Results  Component Value Date   WBC 6.3 02/12/2021   HGB 11.8 (L) 02/12/2021   HCT 35.2 (L) 02/12/2021   MCV 93.6 02/12/2021   PLT 155 02/12/2021      Chemistry      Component Value Date/Time   NA 139 02/12/2021 1417   NA 137 05/25/2020 0000   K 4.8 02/12/2021 1417   CL 105 02/12/2021 1417   CO2 25 02/12/2021 1417   BUN 38 (H) 02/12/2021 1417   BUN 12 05/25/2020 0000   CREATININE 1.39 (H) 02/12/2021 1417   GLU 83 05/25/2020 0000      Component Value Date/Time   CALCIUM 11.1 (H) 02/12/2021 1417   ALKPHOS 51 02/12/2021 1417   AST 20 02/12/2021 1417   ALT 15 02/12/2021 1417   BILITOT 0.4 02/12/2021 1417  RADIOGRAPHIC STUDIES: CT Chest W Contrast  Result Date: 02/13/2021 CLINICAL DATA:  Restaging of non-small cell lung cancer EXAM: CT CHEST, ABDOMEN, AND PELVIS WITH CONTRAST TECHNIQUE: Multidetector CT imaging of the chest, abdomen and pelvis was performed following the standard protocol during bolus administration of intravenous contrast. CONTRAST:  16m OMNIPAQUE IOHEXOL 350 MG/ML SOLN COMPARISON:  Multiple exams, including 11/17/2020 FINDINGS: CT CHEST FINDINGS Cardiovascular: Mild aortic and branch vessel atherosclerotic calcification. Faint calcification of the left main coronary artery. There calcifications of the aortic and mitral valve. Mediastinum/Nodes: Stable small axillary and subpectoral  lymph nodes, not pathologically enlarged by size criteria. 0.8 cm right lower paratracheal lymph node on image 25 series 2, stable and not pathologically enlarged. Lungs/Pleura: Mild biapical pleuroparenchymal scarring. There is new atelectasis or scarring medially in the right upper lobe on image 62 series 4 with a slightly nodular component measuring 0.9 by 0.7 cm, surveillance suggested. Laterally in the right middle lobe, a stable 0.7 by 0.5 cm nodule is present on image 91 series 4. Stable bandlike density medially in the right middle lobe. Stable 3 mm right lower lobe nodule on image 104 series 4. Bandlike density with nodular component medially in the left upper lobe observed, nodular component measuring about 1.9 by 2.1 cm on image 72 series 4, stable. Stable nodularity anteriorly in the lingula on image 84 series 4. Stable scarring in the inferior lingula. Stable scarring laterally in the left lower lobe. Musculoskeletal: Sclerotic lesion with central lucency inferiorly in the T5 vertebral body, stable at 0.8 cm in diameter. A sclerotic lesion in the left posterior T5 vertebral body measures 0.7 by 0.5 cm on image 81 series 6, formerly 0.3 by 0.3 cm. CT ABDOMEN PELVIS FINDINGS Hepatobiliary: Stable small hypodense hepatic lesions, some are cysts but some are technically too small to characterize. No substantial biliary dilatation. Pancreas: 1.1 by 0.7 by 0.7 cm fluid density lesion in the pancreatic tail on image 63 series 2, stable from recent prior exams including 08/11/2020. Spleen: Unremarkable Adrenals/Urinary Tract: Mild thickening/nodularity along the lateral limb of the right adrenal gland without a well-defined mass. Stable complex left kidney upper pole cysts with septation and punctate calcifications separating 2 dominant regions, favoring a Bosniak category 2 cyst. Additional renal cysts are present bilaterally. Stomach/Bowel: Unremarkable Vascular/Lymphatic: Atherosclerosis is present,  including aortoiliac atherosclerotic disease. Scattered periaortic and pelvic lymph nodes are not currently pathologically enlarged. Reproductive: Uterus absent.  Adnexa unremarkable. Other: No supplemental non-categorized findings. Musculoskeletal: 7 mm sclerotic lesion in the L3 vertebral body appears stable. Stable 6 mm sclerotic lesion in the left iliac bone on image 87 series 2. 8 mm probable bone island in the right iliac bone on image 96 series 2. IMPRESSION: 1. Stable nodularity in the lungs including the medial left upper lobe dominant lesion measuring 1.9 by 2.1 cm. 2. In addition there is some new suspected atelectasis or scarring medially in the right upper lobe. This has a slightly nodular component measuring 7 by 9 mm, surveillance suggested. 3. One of the sclerotic lesions in the T5 vertebral body centrally is stable, but the left posterior T5 vertebral body sclerotic lesion has enlarged from previous 3 mm to current 6 mm in diameter. Stable sclerotic lesions in the L3 vertebral body and left iliac bone. 4. 11 by 7 by 7 mm stable fluid density lesion in the pancreatic tail, possibly intraductal papillary mucinous neoplasm or a postinflammatory cystic lesion. Surveillance in the context of the patient's expected imaging follow up is suggested.  5. Hepatic and renal lesions are stable and likely benign. 6. Aortic Atherosclerosis (ICD10-I70.0). Mild coronary atherosclerosis with aortic valve and mitral valve calcifications. 7. Small thoracic and abdominal lymph nodes are not pathologically enlarged by size criteria. Electronically Signed   By: Van Clines M.D.   On: 02/13/2021 14:17   CT Abdomen Pelvis W Contrast  Result Date: 02/13/2021 CLINICAL DATA:  Restaging of non-small cell lung cancer EXAM: CT CHEST, ABDOMEN, AND PELVIS WITH CONTRAST TECHNIQUE: Multidetector CT imaging of the chest, abdomen and pelvis was performed following the standard protocol during bolus administration of  intravenous contrast. CONTRAST:  65m OMNIPAQUE IOHEXOL 350 MG/ML SOLN COMPARISON:  Multiple exams, including 11/17/2020 FINDINGS: CT CHEST FINDINGS Cardiovascular: Mild aortic and branch vessel atherosclerotic calcification. Faint calcification of the left main coronary artery. There calcifications of the aortic and mitral valve. Mediastinum/Nodes: Stable small axillary and subpectoral lymph nodes, not pathologically enlarged by size criteria. 0.8 cm right lower paratracheal lymph node on image 25 series 2, stable and not pathologically enlarged. Lungs/Pleura: Mild biapical pleuroparenchymal scarring. There is new atelectasis or scarring medially in the right upper lobe on image 62 series 4 with a slightly nodular component measuring 0.9 by 0.7 cm, surveillance suggested. Laterally in the right middle lobe, a stable 0.7 by 0.5 cm nodule is present on image 91 series 4. Stable bandlike density medially in the right middle lobe. Stable 3 mm right lower lobe nodule on image 104 series 4. Bandlike density with nodular component medially in the left upper lobe observed, nodular component measuring about 1.9 by 2.1 cm on image 72 series 4, stable. Stable nodularity anteriorly in the lingula on image 84 series 4. Stable scarring in the inferior lingula. Stable scarring laterally in the left lower lobe. Musculoskeletal: Sclerotic lesion with central lucency inferiorly in the T5 vertebral body, stable at 0.8 cm in diameter. A sclerotic lesion in the left posterior T5 vertebral body measures 0.7 by 0.5 cm on image 81 series 6, formerly 0.3 by 0.3 cm. CT ABDOMEN PELVIS FINDINGS Hepatobiliary: Stable small hypodense hepatic lesions, some are cysts but some are technically too small to characterize. No substantial biliary dilatation. Pancreas: 1.1 by 0.7 by 0.7 cm fluid density lesion in the pancreatic tail on image 63 series 2, stable from recent prior exams including 08/11/2020. Spleen: Unremarkable Adrenals/Urinary Tract:  Mild thickening/nodularity along the lateral limb of the right adrenal gland without a well-defined mass. Stable complex left kidney upper pole cysts with septation and punctate calcifications separating 2 dominant regions, favoring a Bosniak category 2 cyst. Additional renal cysts are present bilaterally. Stomach/Bowel: Unremarkable Vascular/Lymphatic: Atherosclerosis is present, including aortoiliac atherosclerotic disease. Scattered periaortic and pelvic lymph nodes are not currently pathologically enlarged. Reproductive: Uterus absent.  Adnexa unremarkable. Other: No supplemental non-categorized findings. Musculoskeletal: 7 mm sclerotic lesion in the L3 vertebral body appears stable. Stable 6 mm sclerotic lesion in the left iliac bone on image 87 series 2. 8 mm probable bone island in the right iliac bone on image 96 series 2. IMPRESSION: 1. Stable nodularity in the lungs including the medial left upper lobe dominant lesion measuring 1.9 by 2.1 cm. 2. In addition there is some new suspected atelectasis or scarring medially in the right upper lobe. This has a slightly nodular component measuring 7 by 9 mm, surveillance suggested. 3. One of the sclerotic lesions in the T5 vertebral body centrally is stable, but the left posterior T5 vertebral body sclerotic lesion has enlarged from previous 3 mm to current 6  mm in diameter. Stable sclerotic lesions in the L3 vertebral body and left iliac bone. 4. 11 by 7 by 7 mm stable fluid density lesion in the pancreatic tail, possibly intraductal papillary mucinous neoplasm or a postinflammatory cystic lesion. Surveillance in the context of the patient's expected imaging follow up is suggested. 5. Hepatic and renal lesions are stable and likely benign. 6. Aortic Atherosclerosis (ICD10-I70.0). Mild coronary atherosclerosis with aortic valve and mitral valve calcifications. 7. Small thoracic and abdominal lymph nodes are not pathologically enlarged by size criteria.  Electronically Signed   By: Van Clines M.D.   On: 02/13/2021 14:17     ASSESSMENT AND PLAN: This is a very pleasant 87 years old white female recently diagnosed with a stage IV (T3, N3, M1 C) non-small cell lung cancer, adenocarcinoma with positive EGFR mutation with deletion in exon 19 diagnosed in December 2021 and presented with left lower lobe lung mass in addition to right supraclavicular, left hilar and subcarinal lymphadenopathy as well as metastatic lymphadenopathy in the abdomen and left pleural-based metastasis as well as malignant left pleural effusion and multiple brain metastasis. The molecular study showed positive EGFR mutation with deletion in exon 19. The patient started treatment with Tagrisso 80 mg p.o. daily on June 10, 2020.  Status post 8 months of treatment.  She has been tolerating her treatment well except for the worsening skin rash, conjunctivitis as well as nail changes. She had repeat CT scan of the chest, abdomen pelvis performed recently.  I personally and independently reviewed the scan and discussed the results with the patient today. Her scan showed no concerning findings for disease progression except for enlargement of a T5 vertebral body sclerotic lesion increased from 0.3 to 0.6 cm. I recommended for the patient to continue her current treatment with Tagrisso but I will reduce the dose of Tagrisso to 40 mg p.o. daily because of the significant adverse effects. For the skin rash she will continue to apply the hydrocortisone cream to the affected area and I will give her a Medrol Dosepak. The patient will come back for follow-up visit in 1 months for evaluation and repeat blood work. She was advised to call immediately if she has any other concerning symptoms in the interval.  The patient voices understanding of current disease status and treatment options and is in agreement with the current care plan.  All questions were answered. The patient knows to  call the clinic with any problems, questions or concerns. We can certainly see the patient much sooner if necessary.   Disclaimer: This note was dictated with voice recognition software. Similar sounding words can inadvertently be transcribed and may not be corrected upon review.

## 2021-02-15 ENCOUNTER — Other Ambulatory Visit (HOSPITAL_COMMUNITY): Payer: Self-pay

## 2021-02-16 DIAGNOSIS — R2681 Unsteadiness on feet: Secondary | ICD-10-CM | POA: Diagnosis not present

## 2021-02-16 DIAGNOSIS — Z9181 History of falling: Secondary | ICD-10-CM | POA: Diagnosis not present

## 2021-02-16 DIAGNOSIS — M6281 Muscle weakness (generalized): Secondary | ICD-10-CM | POA: Diagnosis not present

## 2021-02-20 DIAGNOSIS — Z9181 History of falling: Secondary | ICD-10-CM | POA: Diagnosis not present

## 2021-02-20 DIAGNOSIS — R2681 Unsteadiness on feet: Secondary | ICD-10-CM | POA: Diagnosis not present

## 2021-02-20 DIAGNOSIS — M6281 Muscle weakness (generalized): Secondary | ICD-10-CM | POA: Diagnosis not present

## 2021-02-22 DIAGNOSIS — Z9181 History of falling: Secondary | ICD-10-CM | POA: Diagnosis not present

## 2021-02-22 DIAGNOSIS — R2681 Unsteadiness on feet: Secondary | ICD-10-CM | POA: Diagnosis not present

## 2021-02-22 DIAGNOSIS — M6281 Muscle weakness (generalized): Secondary | ICD-10-CM | POA: Diagnosis not present

## 2021-02-27 DIAGNOSIS — R2681 Unsteadiness on feet: Secondary | ICD-10-CM | POA: Diagnosis not present

## 2021-02-27 DIAGNOSIS — Z9181 History of falling: Secondary | ICD-10-CM | POA: Diagnosis not present

## 2021-02-27 DIAGNOSIS — M6281 Muscle weakness (generalized): Secondary | ICD-10-CM | POA: Diagnosis not present

## 2021-03-01 DIAGNOSIS — M6281 Muscle weakness (generalized): Secondary | ICD-10-CM | POA: Diagnosis not present

## 2021-03-01 DIAGNOSIS — R2681 Unsteadiness on feet: Secondary | ICD-10-CM | POA: Diagnosis not present

## 2021-03-01 DIAGNOSIS — Z9181 History of falling: Secondary | ICD-10-CM | POA: Diagnosis not present

## 2021-03-06 DIAGNOSIS — Z9181 History of falling: Secondary | ICD-10-CM | POA: Diagnosis not present

## 2021-03-06 DIAGNOSIS — M6281 Muscle weakness (generalized): Secondary | ICD-10-CM | POA: Diagnosis not present

## 2021-03-06 DIAGNOSIS — R2681 Unsteadiness on feet: Secondary | ICD-10-CM | POA: Diagnosis not present

## 2021-03-07 ENCOUNTER — Other Ambulatory Visit (HOSPITAL_COMMUNITY): Payer: Self-pay

## 2021-03-08 DIAGNOSIS — M6281 Muscle weakness (generalized): Secondary | ICD-10-CM | POA: Diagnosis not present

## 2021-03-08 DIAGNOSIS — R2681 Unsteadiness on feet: Secondary | ICD-10-CM | POA: Diagnosis not present

## 2021-03-08 DIAGNOSIS — Z9181 History of falling: Secondary | ICD-10-CM | POA: Diagnosis not present

## 2021-03-12 ENCOUNTER — Other Ambulatory Visit (HOSPITAL_COMMUNITY): Payer: Self-pay

## 2021-03-13 DIAGNOSIS — M6281 Muscle weakness (generalized): Secondary | ICD-10-CM | POA: Diagnosis not present

## 2021-03-13 DIAGNOSIS — R2681 Unsteadiness on feet: Secondary | ICD-10-CM | POA: Diagnosis not present

## 2021-03-13 DIAGNOSIS — Z9181 History of falling: Secondary | ICD-10-CM | POA: Diagnosis not present

## 2021-03-15 DIAGNOSIS — M6281 Muscle weakness (generalized): Secondary | ICD-10-CM | POA: Diagnosis not present

## 2021-03-15 DIAGNOSIS — Z9181 History of falling: Secondary | ICD-10-CM | POA: Diagnosis not present

## 2021-03-15 DIAGNOSIS — R2681 Unsteadiness on feet: Secondary | ICD-10-CM | POA: Diagnosis not present

## 2021-03-20 ENCOUNTER — Other Ambulatory Visit: Payer: Self-pay | Admitting: Physician Assistant

## 2021-03-20 DIAGNOSIS — Z9181 History of falling: Secondary | ICD-10-CM | POA: Diagnosis not present

## 2021-03-20 DIAGNOSIS — R2681 Unsteadiness on feet: Secondary | ICD-10-CM | POA: Diagnosis not present

## 2021-03-20 DIAGNOSIS — C3492 Malignant neoplasm of unspecified part of left bronchus or lung: Secondary | ICD-10-CM

## 2021-03-20 DIAGNOSIS — M6281 Muscle weakness (generalized): Secondary | ICD-10-CM | POA: Diagnosis not present

## 2021-03-21 ENCOUNTER — Other Ambulatory Visit: Payer: Self-pay

## 2021-03-21 ENCOUNTER — Inpatient Hospital Stay: Payer: Medicare PPO | Attending: Physician Assistant

## 2021-03-21 ENCOUNTER — Inpatient Hospital Stay: Payer: Medicare PPO | Admitting: Internal Medicine

## 2021-03-21 VITALS — BP 134/57 | HR 84 | Temp 97.6°F | Resp 18 | Ht 64.0 in | Wt 101.1 lb

## 2021-03-21 DIAGNOSIS — Z79899 Other long term (current) drug therapy: Secondary | ICD-10-CM | POA: Insufficient documentation

## 2021-03-21 DIAGNOSIS — C3492 Malignant neoplasm of unspecified part of left bronchus or lung: Secondary | ICD-10-CM | POA: Diagnosis not present

## 2021-03-21 DIAGNOSIS — Z5111 Encounter for antineoplastic chemotherapy: Secondary | ICD-10-CM

## 2021-03-21 DIAGNOSIS — C7931 Secondary malignant neoplasm of brain: Secondary | ICD-10-CM | POA: Diagnosis not present

## 2021-03-21 DIAGNOSIS — C3412 Malignant neoplasm of upper lobe, left bronchus or lung: Secondary | ICD-10-CM | POA: Insufficient documentation

## 2021-03-21 DIAGNOSIS — Z7982 Long term (current) use of aspirin: Secondary | ICD-10-CM | POA: Diagnosis not present

## 2021-03-21 DIAGNOSIS — J910 Malignant pleural effusion: Secondary | ICD-10-CM | POA: Insufficient documentation

## 2021-03-21 LAB — CMP (CANCER CENTER ONLY)
ALT: 16 U/L (ref 0–44)
AST: 21 U/L (ref 15–41)
Albumin: 3.9 g/dL (ref 3.5–5.0)
Alkaline Phosphatase: 66 U/L (ref 38–126)
Anion gap: 6 (ref 5–15)
BUN: 34 mg/dL — ABNORMAL HIGH (ref 8–23)
CO2: 26 mmol/L (ref 22–32)
Calcium: 10.8 mg/dL — ABNORMAL HIGH (ref 8.9–10.3)
Chloride: 105 mmol/L (ref 98–111)
Creatinine: 1.34 mg/dL — ABNORMAL HIGH (ref 0.44–1.00)
GFR, Estimated: 39 mL/min — ABNORMAL LOW (ref >60)
Glucose, Bld: 121 mg/dL — ABNORMAL HIGH (ref 70–99)
Potassium: 4.5 mmol/L (ref 3.5–5.1)
Sodium: 137 mmol/L (ref 135–145)
Total Bilirubin: 0.4 mg/dL (ref 0.3–1.2)
Total Protein: 7 g/dL (ref 6.5–8.1)

## 2021-03-21 LAB — CBC WITH DIFFERENTIAL (CANCER CENTER ONLY)
Abs Immature Granulocytes: 0.02 10*3/uL (ref 0.00–0.07)
Basophils Absolute: 0 10*3/uL (ref 0.0–0.1)
Basophils Relative: 1 %
Eosinophils Absolute: 0.2 10*3/uL (ref 0.0–0.5)
Eosinophils Relative: 3 %
HCT: 35.9 % — ABNORMAL LOW (ref 36.0–46.0)
Hemoglobin: 11.9 g/dL — ABNORMAL LOW (ref 12.0–15.0)
Immature Granulocytes: 0 %
Lymphocytes Relative: 11 %
Lymphs Abs: 0.7 10*3/uL (ref 0.7–4.0)
MCH: 31.6 pg (ref 26.0–34.0)
MCHC: 33.1 g/dL (ref 30.0–36.0)
MCV: 95.2 fL (ref 80.0–100.0)
Monocytes Absolute: 0.7 10*3/uL (ref 0.1–1.0)
Monocytes Relative: 11 %
Neutro Abs: 4.8 10*3/uL (ref 1.7–7.7)
Neutrophils Relative %: 74 %
Platelet Count: 196 10*3/uL (ref 150–400)
RBC: 3.77 MIL/uL — ABNORMAL LOW (ref 3.87–5.11)
RDW: 15.3 % (ref 11.5–15.5)
WBC Count: 6.5 10*3/uL (ref 4.0–10.5)
nRBC: 0 % (ref 0.0–0.2)

## 2021-03-21 NOTE — Progress Notes (Signed)
[facility] [phone]   Fax:([phone]  OFFICE PROGRESS NOTE  [name], MD [place] [address]  DIAGNOSIS: Stage IV (T2b, N2, M1c), non-small cell lung cancer, adenocarcinoma.  The patient presented with a left upper lobe mass, right supra clavicular, left hilar and subcarinal lymphadenopathy as well as diffuse left-sided pleural disease with associated malignant left pleural effusion and 2 small lymph nodes near the SMA in addition to multiple brain metastasis diagnosed in December 2021.   Molecular studies by [employer] showed positive EGFR mutation with deletion in exon 19  PRIOR THERAPY: None  CURRENT THERAPY: Tagrisso 80 mg p.o. daily. First dose June 10, 2020.  Status post 9 months of treatment.  Starting October 2022 the patient will be treated with Tagrisso 40 mg p.o. daily because of the intolerance.  INTERVAL HISTORY: Shelley Flores 87 y.o. female returns to the clinic today for follow-up visit.  The patient is feeling fine today with no concerning complaints except for the conjunctivitis.  She denied having any current chest pain, shortness of breath, cough or hemoptysis.  She has no nausea, vomiting, diarrhea or constipation.  She has no headache or visual changes.  She denied having any significant weight loss or night sweats.  She continues to tolerate her treatment with Tagrisso fairly well.  She is here today for evaluation and repeat blood work.  MEDICAL HISTORY: Past Medical History:  Diagnosis Date   Bruit    Abdominal bruit - Abdominal aorta/renal duplex Doppler evaluation 12/07/03 -Mildly abnormal evaluation. 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 This is consistant w/median arcuate ligament compression syndrome. *Right & Left Kidney: Essentially equal in size, symmetrical in shape w/no significant abnormalities visualized. *Right & Left Renal Arteries: No significant abnormalities.    Cancer ([facility])    Edema extremities    LE edema    H/O myocardial perfusion scan 02/20/00   To rule out ischemia - Negative adequate Bruce protocol exercise stress test with a deconditioned exercise response and normal static myocardial perfusion images with EF calculated by QGS of 77%. Represents a low risk study.   Hyperlipidemia    Hypertension    Osteoarthritis    Osteoporosis    Pleural effusion 04/2020   Valvular heart disease    Mild valvular heart disease by Echo in 2010, all mild and symptomatic, including concentric LVH, MR, TR, and AI with pulmonary artery pressure of 32. EF was normal.    ALLERGIES:  is allergic to irbesartan.  MEDICATIONS:  Current Outpatient Medications  Medication Sig Dispense Refill   acetaminophen (TYLENOL) 325 MG tablet Take 2 tablets (650 mg total) by mouth every 6 (six) hours as needed for mild pain (or Fever >/= 101). 20 tablet 0   amLODipine (NORVASC) 2.5 MG tablet TAKE 1 TABLET BY MOUTH EVERY DAY 90 tablet 1   aspirin 81 MG tablet Take 81 mg by mouth daily.     atorvastatin (LIPITOR) 20 MG tablet Take 1 tablet (20 mg total) by mouth at bedtime. 90 tablet 1   loperamide (IMODIUM) 2 MG capsule Take by mouth as needed for diarrhea or loose stools.     methylPREDNISolone (MEDROL DOSEPAK) 4 MG TBPK tablet Use as instructed. 21 tablet 0   metoprolol succinate (TOPROL-XL) 50 MG 24 hr tablet Take 50 mg by mouth daily. Take with or immediately following a meal.     Multiple Vitamin (MULTIVITAMIN) tablet Take 1 tablet by  mouth daily.     osimertinib mesylate (TAGRISSO) 40 MG tablet Take 1 tablet (40 mg total) by mouth daily. 30 tablet 3   spironolactone (ALDACTONE) 25 MG tablet Take 1 tablet (25 mg total) by mouth daily. 90 tablet 3   zinc oxide 20 % ointment Apply 1 application topically as needed for irritation. apply to buttocks/peri, topical, As Needed, To buttocks after every incontinent episode and as needed for redness. May keep at bedside.     No  current facility-administered medications for this visit.    SURGICAL HISTORY:  Past Surgical History:  Procedure Laterality Date   Abdominal aorta/Renal duplex Doppler Evaluation  12/07/03   For abdominal bruit. Mildly abnormal evaluation. (See bruit in medical history)   BREAST EXCISIONAL BIOPSY Left 1966   BREAST EXCISIONAL BIOPSY Left 1999   CATARACT EXTRACTION  2003   CHEST TUBE INSERTION N/A 07/04/2020   Procedure: REMOVAL PLEURAL DRAINAGE CATHETER;  Surgeon: Garner Nash, DO;  Location: [facility];  Service: Pulmonary;  Laterality: N/A;   COLONOSCOPY  10/22/2004   DEXA Bone Scan  06/23/2013   IR PERC PLEURAL DRAIN W/INDWELL CATH W/IMG GUIDE  05/10/2020   IR THORACENTESIS ASP PLEURAL SPACE W/IMG GUIDE  05/04/2020    REVIEW OF SYSTEMS:  A comprehensive review of systems was negative except for: Constitutional: positive for fatigue Eyes: positive for irritation and redness Musculoskeletal: positive for muscle weakness   PHYSICAL EXAMINATION: General appearance: alert, cooperative, fatigued, and no distress Head: Normocephalic, without obvious abnormality, atraumatic Neck: no adenopathy, no JVD, supple, symmetrical, trachea midline, and thyroid not enlarged, symmetric, no tenderness/mass/nodules Lymph nodes: Cervical, supraclavicular, and axillary nodes normal. Resp: clear to auscultation bilaterally Back: symmetric, no curvature. ROM normal. No CVA tenderness. Cardio: regular rate and rhythm, S1, S2 normal, no murmur, click, rub or gallop GI: soft, non-tender; bowel sounds normal; no masses,  no organomegaly Extremities: extremities normal, atraumatic, no cyanosis or edema   Eye exam looks similar to a month ago.  ECOG PERFORMANCE STATUS: 1 - Symptomatic but completely ambulatory  Blood pressure (!) 134/57, pulse 84, temperature 97.6 F (36.4 C), temperature source Tympanic, resp. rate 18, height _0  (1.626 m), weight 101 lb 1.6 oz (45.9 kg), SpO2 100 %.  LABORATORY  DATA: Lab Results  Component Value Date   WBC 6.5 03/21/2021   HGB 11.9 (L) 03/21/2021   HCT 35.9 (L) 03/21/2021   MCV 95.2 03/21/2021   PLT 196 03/21/2021      Chemistry      Component Value Date/Time   NA 139 02/12/2021 1417   NA 137 05/25/2020 0000   K 4.8 02/12/2021 1417   CL 105 02/12/2021 1417   CO2 25 02/12/2021 1417   BUN 38 (H) 02/12/2021 1417   BUN 12 05/25/2020 0000   CREATININE 1.39 (H) 02/12/2021 1417   GLU 83 05/25/2020 0000      Component Value Date/Time   CALCIUM 11.1 (H) 02/12/2021 1417   ALKPHOS 51 02/12/2021 1417   AST 20 02/12/2021 1417   ALT 15 02/12/2021 1417   BILITOT 0.4 02/12/2021 1417       RADIOGRAPHIC STUDIES: No results found.   ASSESSMENT AND PLAN: This is a very pleasant 87 years old white female recently diagnosed with a stage IV (T3, N3, M1 C) non-small cell lung cancer, adenocarcinoma with positive EGFR mutation with deletion in exon 19 diagnosed in December 2021 and presented with left lower lobe lung mass in addition to right supraclavicular, left hilar and  subcarinal lymphadenopathy as well as metastatic lymphadenopathy in the abdomen and left pleural-based metastasis as well as malignant left pleural effusion and multiple brain metastasis. The molecular study showed positive EGFR mutation with deletion in exon 19. The patient started treatment with Tagrisso 80 mg p.o. daily on June 10, 2020.  Status post 9 months of treatment.  Starting from October 2022 her dose has changed to 40 mg p.o. daily.   She continues to tolerate the treatment well but she has persistent conjunctivitis. I recommended for her to continue her current treatment with Tagrisso 40 mg p.o. daily. I also strongly recommend for the patient to see ophthalmology for management of her persistent conjunctivitis. For the skin rash, she will continue with the hydrocortisone cream to the affected area. I will see the patient back for follow-up visit in 1 months for  evaluation with repeat CBC and comprehensive metabolic panel. She was advised to call immediately if she has any concerning symptoms in the interval. The patient voices understanding of current disease status and treatment options and is in agreement with the current care plan.  All questions were answered. The patient knows to call the clinic with any problems, questions or concerns. We can certainly see the patient much sooner if necessary.   Disclaimer: This note was dictated with voice recognition software. Similar sounding words can inadvertently be transcribed and may not be corrected upon review.

## 2021-03-22 DIAGNOSIS — Z9181 History of falling: Secondary | ICD-10-CM | POA: Diagnosis not present

## 2021-03-22 DIAGNOSIS — M6281 Muscle weakness (generalized): Secondary | ICD-10-CM | POA: Diagnosis not present

## 2021-03-22 DIAGNOSIS — R2681 Unsteadiness on feet: Secondary | ICD-10-CM | POA: Diagnosis not present

## 2021-03-27 DIAGNOSIS — R2681 Unsteadiness on feet: Secondary | ICD-10-CM | POA: Diagnosis not present

## 2021-03-27 DIAGNOSIS — M6281 Muscle weakness (generalized): Secondary | ICD-10-CM | POA: Diagnosis not present

## 2021-03-27 DIAGNOSIS — Z9181 History of falling: Secondary | ICD-10-CM | POA: Diagnosis not present

## 2021-03-29 DIAGNOSIS — M6281 Muscle weakness (generalized): Secondary | ICD-10-CM | POA: Diagnosis not present

## 2021-03-29 DIAGNOSIS — Z9181 History of falling: Secondary | ICD-10-CM | POA: Diagnosis not present

## 2021-03-29 DIAGNOSIS — R2681 Unsteadiness on feet: Secondary | ICD-10-CM | POA: Diagnosis not present

## 2021-03-30 ENCOUNTER — Telehealth: Payer: Self-pay | Admitting: Internal Medicine

## 2021-03-30 DIAGNOSIS — H02105 Unspecified ectropion of left lower eyelid: Secondary | ICD-10-CM | POA: Diagnosis not present

## 2021-03-30 DIAGNOSIS — H02102 Unspecified ectropion of right lower eyelid: Secondary | ICD-10-CM | POA: Diagnosis not present

## 2021-03-30 NOTE — Telephone Encounter (Signed)
Sch per 11/10 inbasket,pt aware

## 2021-04-03 ENCOUNTER — Other Ambulatory Visit (HOSPITAL_COMMUNITY): Payer: Self-pay

## 2021-04-03 ENCOUNTER — Other Ambulatory Visit: Payer: Self-pay | Admitting: Internal Medicine

## 2021-04-03 DIAGNOSIS — Z9181 History of falling: Secondary | ICD-10-CM | POA: Diagnosis not present

## 2021-04-03 DIAGNOSIS — M6281 Muscle weakness (generalized): Secondary | ICD-10-CM | POA: Diagnosis not present

## 2021-04-03 DIAGNOSIS — R2681 Unsteadiness on feet: Secondary | ICD-10-CM | POA: Diagnosis not present

## 2021-04-06 DIAGNOSIS — M6281 Muscle weakness (generalized): Secondary | ICD-10-CM | POA: Diagnosis not present

## 2021-04-06 DIAGNOSIS — R2681 Unsteadiness on feet: Secondary | ICD-10-CM | POA: Diagnosis not present

## 2021-04-06 DIAGNOSIS — Z9181 History of falling: Secondary | ICD-10-CM | POA: Diagnosis not present

## 2021-04-10 DIAGNOSIS — M6281 Muscle weakness (generalized): Secondary | ICD-10-CM | POA: Diagnosis not present

## 2021-04-10 DIAGNOSIS — Z9181 History of falling: Secondary | ICD-10-CM | POA: Diagnosis not present

## 2021-04-10 DIAGNOSIS — R2681 Unsteadiness on feet: Secondary | ICD-10-CM | POA: Diagnosis not present

## 2021-04-11 ENCOUNTER — Other Ambulatory Visit (HOSPITAL_COMMUNITY): Payer: Self-pay

## 2021-04-13 DIAGNOSIS — H02105 Unspecified ectropion of left lower eyelid: Secondary | ICD-10-CM | POA: Diagnosis not present

## 2021-04-13 DIAGNOSIS — H029 Unspecified disorder of eyelid: Secondary | ICD-10-CM | POA: Diagnosis not present

## 2021-04-13 DIAGNOSIS — H02102 Unspecified ectropion of right lower eyelid: Secondary | ICD-10-CM | POA: Diagnosis not present

## 2021-04-18 ENCOUNTER — Inpatient Hospital Stay: Payer: Medicare PPO | Admitting: Internal Medicine

## 2021-04-18 ENCOUNTER — Other Ambulatory Visit: Payer: Self-pay

## 2021-04-18 ENCOUNTER — Inpatient Hospital Stay: Payer: Medicare PPO | Attending: Physician Assistant

## 2021-04-18 VITALS — BP 137/65 | HR 90 | Temp 98.2°F | Resp 17 | Ht 64.0 in | Wt 102.5 lb

## 2021-04-18 DIAGNOSIS — H109 Unspecified conjunctivitis: Secondary | ICD-10-CM | POA: Diagnosis not present

## 2021-04-18 DIAGNOSIS — C3412 Malignant neoplasm of upper lobe, left bronchus or lung: Secondary | ICD-10-CM | POA: Insufficient documentation

## 2021-04-18 DIAGNOSIS — Z7982 Long term (current) use of aspirin: Secondary | ICD-10-CM | POA: Insufficient documentation

## 2021-04-18 DIAGNOSIS — C3490 Malignant neoplasm of unspecified part of unspecified bronchus or lung: Secondary | ICD-10-CM | POA: Diagnosis not present

## 2021-04-18 DIAGNOSIS — C7931 Secondary malignant neoplasm of brain: Secondary | ICD-10-CM | POA: Diagnosis not present

## 2021-04-18 DIAGNOSIS — J910 Malignant pleural effusion: Secondary | ICD-10-CM | POA: Insufficient documentation

## 2021-04-18 DIAGNOSIS — C3492 Malignant neoplasm of unspecified part of left bronchus or lung: Secondary | ICD-10-CM

## 2021-04-18 DIAGNOSIS — Z79899 Other long term (current) drug therapy: Secondary | ICD-10-CM | POA: Diagnosis not present

## 2021-04-18 LAB — CBC WITH DIFFERENTIAL (CANCER CENTER ONLY)
Abs Immature Granulocytes: 0.02 10*3/uL (ref 0.00–0.07)
Basophils Absolute: 0 10*3/uL (ref 0.0–0.1)
Basophils Relative: 1 %
Eosinophils Absolute: 0.4 10*3/uL (ref 0.0–0.5)
Eosinophils Relative: 4 %
HCT: 35.9 % — ABNORMAL LOW (ref 36.0–46.0)
Hemoglobin: 11.8 g/dL — ABNORMAL LOW (ref 12.0–15.0)
Immature Granulocytes: 0 %
Lymphocytes Relative: 9 %
Lymphs Abs: 0.8 10*3/uL (ref 0.7–4.0)
MCH: 30.7 pg (ref 26.0–34.0)
MCHC: 32.9 g/dL (ref 30.0–36.0)
MCV: 93.5 fL (ref 80.0–100.0)
Monocytes Absolute: 0.8 10*3/uL (ref 0.1–1.0)
Monocytes Relative: 10 %
Neutro Abs: 6.5 10*3/uL (ref 1.7–7.7)
Neutrophils Relative %: 76 %
Platelet Count: 302 10*3/uL (ref 150–400)
RBC: 3.84 MIL/uL — ABNORMAL LOW (ref 3.87–5.11)
RDW: 14.1 % (ref 11.5–15.5)
WBC Count: 8.4 10*3/uL (ref 4.0–10.5)
nRBC: 0 % (ref 0.0–0.2)

## 2021-04-18 LAB — CMP (CANCER CENTER ONLY)
ALT: 15 U/L (ref 0–44)
AST: 18 U/L (ref 15–41)
Albumin: 3.6 g/dL (ref 3.5–5.0)
Alkaline Phosphatase: 68 U/L (ref 38–126)
Anion gap: 10 (ref 5–15)
BUN: 34 mg/dL — ABNORMAL HIGH (ref 8–23)
CO2: 26 mmol/L (ref 22–32)
Calcium: 10.7 mg/dL — ABNORMAL HIGH (ref 8.9–10.3)
Chloride: 102 mmol/L (ref 98–111)
Creatinine: 1.33 mg/dL — ABNORMAL HIGH (ref 0.44–1.00)
GFR, Estimated: 39 mL/min — ABNORMAL LOW (ref >60)
Glucose, Bld: 104 mg/dL — ABNORMAL HIGH (ref 70–99)
Potassium: 4.6 mmol/L (ref 3.5–5.1)
Sodium: 138 mmol/L (ref 135–145)
Total Bilirubin: 0.3 mg/dL (ref 0.3–1.2)
Total Protein: 7.4 g/dL (ref 6.5–8.1)

## 2021-04-18 NOTE — Progress Notes (Signed)
[facility] Telephone:([phone]   Fax:([phone]  OFFICE PROGRESS NOTE  [name], MD [place] [address]  DIAGNOSIS: Stage IV (T2b, N2, M1c), non-small cell lung cancer, adenocarcinoma.  The patient presented with a left upper lobe mass, right supra clavicular, left hilar and subcarinal lymphadenopathy as well as diffuse left-sided pleural disease with associated malignant left pleural effusion and 2 small lymph nodes near the SMA in addition to multiple brain metastasis diagnosed in December 2021.   Molecular studies by [facility] [employer] showed positive EGFR mutation with deletion in exon 19  PRIOR THERAPY: None  CURRENT THERAPY: Tagrisso 80 mg p.o. daily. First dose June 10, 2020.  Status post 10 months of treatment.  Starting October 2022 the patient will be treated with Tagrisso 40 mg p.o. daily because of the intolerance.  INTERVAL HISTORY: Shelley Flores 87 y.o. female returns to the clinic today for follow-up visit.  The patient is feeling fine today with no concerning complaints except for mild fatigue and intermittent pain on the left side of the chest lower rib cage.  She was seen by ophthalmology recently and started treatment with lubricant eye ointment as well as Systane with significant improvement in her conjunctivitis.  The patient denied having any current shortness of breath, cough or hemoptysis.  She denied having any fever or chills.  She has no nausea, vomiting, diarrhea or constipation.  She has no headache or visual changes.  She is here today for evaluation and repeat blood work.  MEDICAL HISTORY: Past Medical History:  Diagnosis Date   Bruit    Abdominal bruit - Abdominal aorta/renal duplex Doppler evaluation 12/07/03 -Mildly abnormal evaluation. 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 This is consistant w/median arcuate ligament compression syndrome. *Right & Left Kidney: Essentially equal in size,  symmetrical in shape w/no significant abnormalities visualized. *Right & Left Renal Arteries: No significant abnormalities.   Cancer ([facility])    Edema extremities    LE edema    H/O myocardial perfusion scan 02/20/00   To rule out ischemia - Negative adequate Bruce protocol exercise stress test with a deconditioned exercise response and normal static myocardial perfusion images with EF calculated by QGS of 77%. Represents a low risk study.   Hyperlipidemia    Hypertension    Osteoarthritis    Osteoporosis    Pleural effusion 04/2020   Valvular heart disease    Mild valvular heart disease by Echo in 2010, all mild and symptomatic, including concentric LVH, MR, TR, and AI with pulmonary artery pressure of 32. EF was normal.    ALLERGIES:  is allergic to irbesartan.  MEDICATIONS:  Current Outpatient Medications  Medication Sig Dispense Refill   acetaminophen (TYLENOL) 325 MG tablet Take 2 tablets (650 mg total) by mouth every 6 (six) hours as needed for mild pain (or Fever >/= 101). 20 tablet 0   amLODipine (NORVASC) 2.5 MG tablet TAKE 1 TABLET BY MOUTH EVERY DAY 90 tablet 1   aspirin 81 MG tablet Take 81 mg by mouth daily.     atorvastatin (LIPITOR) 20 MG tablet Take 1 tablet (20 mg total) by mouth at bedtime. 90 tablet 1   metoprolol succinate (TOPROL-XL) 50 MG 24 hr tablet Take 50 mg by mouth daily. Take with or immediately following a meal.     Multiple Vitamin (MULTIVITAMIN) tablet Take 1 tablet by mouth daily.     osimertinib mesylate (TAGRISSO) 40 MG tablet  Take 1 tablet (40 mg total) by mouth daily. 30 tablet 3   Polyethyl Glycol-Propyl Glycol (SYSTANE HYDRATION PF OP) Apply to eye.     White Petrolatum-Mineral Oil ([place] PETROL-MINERAL OIL-LANOLIN) 0.1-0.1 % OINT Apply to eye.     zinc oxide 20 % ointment Apply 1 application topically as needed for irritation. apply to buttocks/peri, topical, As Needed, To buttocks after every incontinent episode and as needed for redness. May keep at  bedside.     loperamide (IMODIUM) 2 MG capsule Take by mouth as needed for diarrhea or loose stools. (Patient not taking: Reported on 04/18/2021)     spironolactone (ALDACTONE) 25 MG tablet Take 1 tablet (25 mg total) by mouth daily. 90 tablet 3   No current facility-administered medications for this visit.    SURGICAL HISTORY:  Past Surgical History:  Procedure Laterality Date   Abdominal aorta/Renal duplex Doppler Evaluation  12/07/03   For abdominal bruit. Mildly abnormal evaluation. (See bruit in medical history)   BREAST EXCISIONAL BIOPSY Left 1966   BREAST EXCISIONAL BIOPSY Left 1999   CATARACT EXTRACTION  2003   CHEST TUBE INSERTION N/A 07/04/2020   Procedure: REMOVAL PLEURAL DRAINAGE CATHETER;  Surgeon: Garner Nash, DO;  Location: [facility];  Service: Pulmonary;  Laterality: N/A;   COLONOSCOPY  10/22/2004   DEXA Bone Scan  06/23/2013   IR PERC PLEURAL DRAIN W/INDWELL CATH W/IMG GUIDE  05/10/2020   IR THORACENTESIS ASP PLEURAL SPACE W/IMG GUIDE  05/04/2020    REVIEW OF SYSTEMS:  A comprehensive review of systems was negative except for: Constitutional: positive for fatigue Respiratory: positive for pleurisy/chest pain   PHYSICAL EXAMINATION: General appearance: alert, cooperative, fatigued, and no distress Head: Normocephalic, without obvious abnormality, atraumatic Neck: no adenopathy, no JVD, supple, symmetrical, trachea midline, and thyroid not enlarged, symmetric, no tenderness/mass/nodules Lymph nodes: Cervical, supraclavicular, and axillary nodes normal. Resp: clear to auscultation bilaterally Back: symmetric, no curvature. ROM normal. No CVA tenderness. Cardio: regular rate and rhythm, S1, S2 normal, no murmur, click, rub or gallop GI: soft, non-tender; bowel sounds normal; no masses,  no organomegaly Extremities: extremities normal, atraumatic, no cyanosis or edema    After treatment with lubricant eye ointment and Systane    ECOG PERFORMANCE STATUS: 1 -  Symptomatic but completely ambulatory  Blood pressure 137/65, pulse 90, temperature 98.2 F (36.8 C), temperature source Tympanic, resp. rate 17, height '5\' 4"'  (1.626 m), weight 102 lb 8 oz (46.5 kg), SpO2 100 %.  LABORATORY DATA: Lab Results  Component Value Date   WBC 8.4 04/18/2021   HGB 11.8 (L) 04/18/2021   HCT 35.9 (L) 04/18/2021   MCV 93.5 04/18/2021   PLT 302 04/18/2021      Chemistry      Component Value Date/Time   NA 137 03/21/2021 1244   NA 137 05/25/2020 0000   K 4.5 03/21/2021 1244   CL 105 03/21/2021 1244   CO2 26 03/21/2021 1244   BUN 34 (H) 03/21/2021 1244   BUN 12 05/25/2020 0000   CREATININE 1.34 (H) 03/21/2021 1244   GLU 83 05/25/2020 0000      Component Value Date/Time   CALCIUM 10.8 (H) 03/21/2021 1244   ALKPHOS 66 03/21/2021 1244   AST 21 03/21/2021 1244   ALT 16 03/21/2021 1244   BILITOT 0.4 03/21/2021 1244       RADIOGRAPHIC STUDIES: No results found.   ASSESSMENT AND PLAN: This is a very pleasant 87 years old white female recently diagnosed with a stage  IV (T3, N3, M1 C) non-small cell lung cancer, adenocarcinoma with positive EGFR mutation with deletion in exon 19 diagnosed in December 2021 and presented with left lower lobe lung mass in addition to right supraclavicular, left hilar and subcarinal lymphadenopathy as well as metastatic lymphadenopathy in the abdomen and left pleural-based metastasis as well as malignant left pleural effusion and multiple brain metastasis. The molecular study showed positive EGFR mutation with deletion in exon 19. The patient started treatment with Tagrisso 80 mg p.o. daily on June 10, 2020.  Status post 9 months of treatment.  Starting from October 2022 her dose has changed to 40 mg p.o. daily.   She continues to tolerate her treatment well with no concerning adverse effects except for the conjunctivitis that has significantly improved with the over-the-counter lubricant ointment as well as Systane. I  recommended for her to continue her current treatment with Tagrisso 40 mg p.o. daily. I will see the patient back for follow-up visit in 1 months for evaluation with repeat CT scan of the chest, abdomen and pelvis for restaging of her disease. For the skin rash, she will continue with the hydrocortisone cream to the affected area. She was advised to call immediately if she has any other concerning symptoms in the interval. The patient voices understanding of current disease status and treatment options and is in agreement with the current care plan.  All questions were answered. The patient knows to call the clinic with any problems, questions or concerns. We can certainly see the patient much sooner if necessary.   Disclaimer: This note was dictated with voice recognition software. Similar sounding words can inadvertently be transcribed and may not be corrected upon review.

## 2021-04-18 NOTE — Progress Notes (Signed)
[place] [phone]) [phone]   [phone]  OFFICE PROGRESS NOTE  [name], MD [place] [address]  DIAGNOSIS: Stage IV (T2b, N2, M1c), non-small cell lung cancer, adenocarcinoma.  The patient presented with a left upper lobe mass, right supra clavicular, left hilar and subcarinal lymphadenopathy as well as diffuse left-sided pleural disease with associated malignant left pleural effusion and 2 small lymph nodes near the SMA in addition to multiple brain metastasis diagnosed in December 2021.   Molecular studies by [facility] showed positive EGFR mutation with deletion in exon 19  PRIOR THERAPY: None  CURRENT THERAPY: Tagrisso 80 mg p.o. daily. First dose June 10, 2020.  Status post 9 months of treatment.  Starting October 2022 the patient will be treated with Tagrisso 40 mg p.o. daily because of the intolerance.  INTERVAL HISTORY: Shelley Flores 87 y.o. female returns to the clinic today for follow-up visit.  The patient is feeling fine today with no concerning complaints except for the conjunctivitis.  She denied having any current chest pain, shortness of breath, cough or hemoptysis.  She has no nausea, vomiting, diarrhea or constipation.  She has no headache or visual changes.  She denied having any significant weight loss or night sweats.  She continues to tolerate her treatment with Tagrisso fairly well.  She is here today for evaluation and repeat blood work.  MEDICAL HISTORY: Past Medical History:  Diagnosis Date   Bruit    Abdominal bruit - Abdominal aorta/renal duplex Doppler evaluation 12/07/03 -Mildly abnormal evaluation. 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 This is consistant w/median arcuate ligament compression syndrome. *Right & Left Kidney: Essentially equal in size, symmetrical in shape w/no significant abnormalities visualized. *Right & Left Renal Arteries: No significant abnormalities.    Cancer ([place])    Edema extremities    LE edema    H/O myocardial perfusion scan 02/20/00   To rule out ischemia - Negative adequate Bruce protocol exercise stress test with a deconditioned exercise response and normal static myocardial perfusion images with EF calculated by QGS of 77%. Represents a low risk study.   Hyperlipidemia    Hypertension    Osteoarthritis    Osteoporosis    Pleural effusion 04/2020   Valvular heart disease    Mild valvular heart disease by Echo in 2010, all mild and symptomatic, including concentric LVH, MR, TR, and AI with pulmonary artery pressure of 32. EF was normal.    ALLERGIES:  is allergic to irbesartan.  MEDICATIONS:  Current Outpatient Medications  Medication Sig Dispense Refill   acetaminophen (TYLENOL) 325 MG tablet Take 2 tablets (650 mg total) by mouth every 6 (six) hours as needed for mild pain (or Fever >/= 101). 20 tablet 0   amLODipine (NORVASC) 2.5 MG tablet TAKE 1 TABLET BY MOUTH EVERY DAY 90 tablet 1   aspirin 81 MG tablet Take 81 mg by mouth daily.     atorvastatin (LIPITOR) 20 MG tablet Take 1 tablet (20 mg total) by mouth at bedtime. 90 tablet 1   metoprolol succinate (TOPROL-XL) 50 MG 24 hr tablet Take 50 mg by mouth daily. Take with or immediately following a meal.     Multiple Vitamin (MULTIVITAMIN) tablet Take 1 tablet by mouth daily.     osimertinib mesylate (TAGRISSO) 40 MG tablet Take 1 tablet (40 mg total) by mouth daily. 30 tablet 3   Polyethyl Glycol-Propyl Glycol (SYSTANE HYDRATION PF OP) Apply  to eye.     White Petrolatum-Mineral Oil ([place] PETROL-MINERAL OIL-LANOLIN) 0.1-0.1 % OINT Apply to eye.     zinc oxide 20 % ointment Apply 1 application topically as needed for irritation. apply to buttocks/peri, topical, As Needed, To buttocks after every incontinent episode and as needed for redness. May keep at bedside.     loperamide (IMODIUM) 2 MG capsule Take by mouth as needed for diarrhea or loose stools. (Patient not taking:  Reported on 04/18/2021)     spironolactone (ALDACTONE) 25 MG tablet Take 1 tablet (25 mg total) by mouth daily. 90 tablet 3   No current facility-administered medications for this visit.    SURGICAL HISTORY:  Past Surgical History:  Procedure Laterality Date   Abdominal aorta/Renal duplex Doppler Evaluation  12/07/03   For abdominal bruit. Mildly abnormal evaluation. (See bruit in medical history)   BREAST EXCISIONAL BIOPSY Left 1966   BREAST EXCISIONAL BIOPSY Left 1999   CATARACT EXTRACTION  2003   CHEST TUBE INSERTION N/A 07/04/2020   Procedure: REMOVAL PLEURAL DRAINAGE CATHETER;  Surgeon: Garner Nash, DO;  Location: [facility];  Service: Pulmonary;  Laterality: N/A;   COLONOSCOPY  10/22/2004   DEXA Bone Scan  06/23/2013   IR PERC PLEURAL DRAIN W/INDWELL CATH W/IMG GUIDE  05/10/2020   IR THORACENTESIS ASP PLEURAL SPACE W/IMG GUIDE  05/04/2020    REVIEW OF SYSTEMS:  A comprehensive review of systems was negative except for: Constitutional: positive for fatigue Eyes: positive for irritation and redness Musculoskeletal: positive for muscle weakness   PHYSICAL EXAMINATION: General appearance: alert, cooperative, fatigued, and no distress Head: Normocephalic, without obvious abnormality, atraumatic Neck: no adenopathy, no JVD, supple, symmetrical, trachea midline, and thyroid not enlarged, symmetric, no tenderness/mass/nodules Lymph nodes: Cervical, supraclavicular, and axillary nodes normal. Resp: clear to auscultation bilaterally Back: symmetric, no curvature. ROM normal. No CVA tenderness. Cardio: regular rate and rhythm, S1, S2 normal, no murmur, click, rub or gallop GI: soft, non-tender; bowel sounds normal; no masses,  no organomegaly Extremities: extremities normal, atraumatic, no cyanosis or edema   Eye exam looks similar to a month ago.  ECOG PERFORMANCE STATUS: 1 - Symptomatic but completely ambulatory  Blood pressure 137/65, pulse 90, temperature 98.2 F (36.8 C),  temperature source Tympanic, resp. rate 17, height _0  (1.626 m), weight 102 lb 8 oz (46.5 kg), SpO2 100 %.  LABORATORY DATA: Lab Results  Component Value Date   WBC 8.4 04/18/2021   HGB 11.8 (L) 04/18/2021   HCT 35.9 (L) 04/18/2021   MCV 93.5 04/18/2021   PLT 302 04/18/2021      Chemistry      Component Value Date/Time   NA 137 03/21/2021 1244   NA 137 05/25/2020 0000   K 4.5 03/21/2021 1244   CL 105 03/21/2021 1244   CO2 26 03/21/2021 1244   BUN 34 (H) 03/21/2021 1244   BUN 12 05/25/2020 0000   CREATININE 1.34 (H) 03/21/2021 1244   GLU 83 05/25/2020 0000      Component Value Date/Time   CALCIUM 10.8 (H) 03/21/2021 1244   ALKPHOS 66 03/21/2021 1244   AST 21 03/21/2021 1244   ALT 16 03/21/2021 1244   BILITOT 0.4 03/21/2021 1244       RADIOGRAPHIC STUDIES: No results found.   ASSESSMENT AND PLAN: This is a very pleasant 87 years old white female recently diagnosed with a stage IV (T3, N3, M1 C) non-small cell lung cancer, adenocarcinoma with positive EGFR mutation with deletion in exon 19  diagnosed in December 2021 and presented with left lower lobe lung mass in addition to right supraclavicular, left hilar and subcarinal lymphadenopathy as well as metastatic lymphadenopathy in the abdomen and left pleural-based metastasis as well as malignant left pleural effusion and multiple brain metastasis. The molecular study showed positive EGFR mutation with deletion in exon 19. The patient started treatment with Tagrisso 80 mg p.o. daily on June 10, 2020.  Status post 9 months of treatment.  Starting from October 2022 her dose has changed to 40 mg p.o. daily.   She continues to tolerate the treatment well but she has persistent conjunctivitis. I recommended for her to continue her current treatment with Tagrisso 40 mg p.o. daily. I also strongly recommend for the patient to see ophthalmology for management of her persistent conjunctivitis. For the skin rash, she will continue  with the hydrocortisone cream to the affected area. I will see the patient back for follow-up visit in 1 months for evaluation with repeat CBC and comprehensive metabolic panel. She was advised to call immediately if she has any concerning symptoms in the interval. The patient voices understanding of current disease status and treatment options and is in agreement with the current care plan.  All questions were answered. The patient knows to call the clinic with any problems, questions or concerns. We can certainly see the patient much sooner if necessary.   Disclaimer: This note was dictated with voice recognition software. Similar sounding words can inadvertently be transcribed and may not be corrected upon review.

## 2021-04-20 ENCOUNTER — Telehealth: Payer: Self-pay | Admitting: Internal Medicine

## 2021-04-20 NOTE — Telephone Encounter (Signed)
Sch per 12/7 los, pt aware

## 2021-05-04 DIAGNOSIS — L602 Onychogryphosis: Secondary | ICD-10-CM | POA: Diagnosis not present

## 2021-05-08 ENCOUNTER — Other Ambulatory Visit (HOSPITAL_COMMUNITY): Payer: Self-pay

## 2021-05-09 ENCOUNTER — Other Ambulatory Visit: Payer: Self-pay

## 2021-05-09 ENCOUNTER — Encounter: Payer: Self-pay | Admitting: Internal Medicine

## 2021-05-09 ENCOUNTER — Non-Acute Institutional Stay: Payer: Medicare PPO | Admitting: Internal Medicine

## 2021-05-09 VITALS — BP 129/53 | HR 76 | Temp 97.4°F | Ht 64.0 in | Wt 102.1 lb

## 2021-05-09 DIAGNOSIS — C3492 Malignant neoplasm of unspecified part of left bronchus or lung: Secondary | ICD-10-CM

## 2021-05-09 DIAGNOSIS — K219 Gastro-esophageal reflux disease without esophagitis: Secondary | ICD-10-CM | POA: Diagnosis not present

## 2021-05-09 DIAGNOSIS — C3491 Malignant neoplasm of unspecified part of right bronchus or lung: Secondary | ICD-10-CM | POA: Diagnosis not present

## 2021-05-09 DIAGNOSIS — I10 Essential (primary) hypertension: Secondary | ICD-10-CM | POA: Diagnosis not present

## 2021-05-09 DIAGNOSIS — E782 Mixed hyperlipidemia: Secondary | ICD-10-CM

## 2021-05-09 DIAGNOSIS — R634 Abnormal weight loss: Secondary | ICD-10-CM

## 2021-05-09 DIAGNOSIS — R600 Localized edema: Secondary | ICD-10-CM

## 2021-05-11 NOTE — Progress Notes (Signed)
Location:  [facility] of Service:  Clinic (12)  Provider:   Code Status: DNR Goals of Care:  Advanced Directives 11/08/2020  Does Patient Have a Medical Advance Directive? Yes  Type of Paramedic of [place];Living will;Out of facility DNR (pink MOST or yellow form)  Does patient want to make changes to medical advance directive? No - Guardian declined  Copy of [place] in Chart? Yes - validated most recent copy scanned in chart (See row information)  Pre-existing out of facility DNR order (yellow form or pink MOST form) Yellow form placed in chart (order not valid for inpatient use)     Chief Complaint  Patient presents with   Medical Management of Chronic Issues    Patient returns to the clinic for her 6 month . She would also like to discuss her status with cancer.    Quality Metric Gaps    Shingrix    HPI: Patient is a 87 y.o. female seen today for medical management of chronic diseases.    Patient has a history of hypertension hyperlipidemia, lower extremity edema Patient was admitted from 12/20-12/31 for Left Pleural effusion with Hypoxia Followed by Diagnosis of Lung cancer PET scan and MRI shows Metastatic Disease on Tagrisso  Dose of Tagrisso was reduced recently She feels much better since then Her weight has stabilized and she feels like she has more energy Appetite is better Repeat CT since then is pending. So far her cancer is stable  Per Dr Debara Pickett cardiology she need repeat Lipid panel. Also need to stay on Norvasc and Statin  Her Bowel movements are still erratic Continues to have Broken Nails and scalpy scabs due to Chemo Was seen By ophthalmologist for crusty eyes which are better on Lubricant No Falls. No SOB Just pain in left side with breathing sometimes  Patient does not have any close family.  She never married and had no Kids Her friend is her healthcare power of attorney  Past Medical  History:  Diagnosis Date   Bruit    Abdominal bruit - Abdominal aorta/renal duplex Doppler evaluation 12/07/03 -Mildly abnormal evaluation. 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 This is consistant w/median arcuate ligament compression syndrome. *Right & Left Kidney: Essentially equal in size, symmetrical in shape w/no significant abnormalities visualized. *Right & Left Renal Arteries: No significant abnormalities.   Cancer ([place])    Edema extremities    LE edema    H/O myocardial perfusion scan 02/20/00   To rule out ischemia - Negative adequate Bruce protocol exercise stress test with a deconditioned exercise response and normal static myocardial perfusion images with EF calculated by QGS of 77%. Represents a low risk study.   Hyperlipidemia    Hypertension    Osteoarthritis    Osteoporosis    Pleural effusion 04/2020   Valvular heart disease    Mild valvular heart disease by Echo in 2010, all mild and symptomatic, including concentric LVH, MR, TR, and AI with pulmonary artery pressure of 32. EF was normal.    Past Surgical History:  Procedure Laterality Date   Abdominal aorta/Renal duplex Doppler Evaluation  12/07/03   For abdominal bruit. Mildly abnormal evaluation. (See bruit in medical history)   BREAST EXCISIONAL BIOPSY Left 1966   BREAST EXCISIONAL BIOPSY Left 1999   CATARACT EXTRACTION  2003   CHEST TUBE INSERTION N/A 07/04/2020   Procedure: REMOVAL PLEURAL DRAINAGE CATHETER;  Surgeon: Garner Nash, DO;  Location: [facility];  Service: Pulmonary;  Laterality: N/A;   COLONOSCOPY  10/22/2004   DEXA Bone Scan  06/23/2013   IR PERC PLEURAL DRAIN W/INDWELL CATH W/IMG GUIDE  05/10/2020   IR THORACENTESIS ASP PLEURAL SPACE W/IMG GUIDE  05/04/2020    Allergies  Allergen Reactions   Irbesartan Nausea Only    Outpatient Encounter Medications as of 05/09/2021  Medication Sig   acetaminophen (TYLENOL) 325 MG tablet Take 2 tablets (650 mg total) by mouth every 6  (six) hours as needed for mild pain (or Fever >/= 101).   amLODipine (NORVASC) 2.5 MG tablet TAKE 1 TABLET BY MOUTH EVERY DAY   aspirin 81 MG tablet Take 81 mg by mouth daily.   atorvastatin (LIPITOR) 20 MG tablet Take 1 tablet (20 mg total) by mouth at bedtime.   loperamide (IMODIUM) 2 MG capsule Take by mouth as needed for diarrhea or loose stools. (Patient not taking: Reported on 04/18/2021)   metoprolol succinate (TOPROL-XL) 50 MG 24 hr tablet Take 50 mg by mouth daily. Take with or immediately following a meal.   Multiple Vitamin (MULTIVITAMIN) tablet Take 1 tablet by mouth daily.   osimertinib mesylate (TAGRISSO) 40 MG tablet Take 1 tablet (40 mg total) by mouth daily.   Polyethyl Glycol-Propyl Glycol (SYSTANE HYDRATION PF OP) Apply to eye.   spironolactone (ALDACTONE) 25 MG tablet Take 1 tablet (25 mg total) by mouth daily.   White Petrolatum-Mineral Oil ([place] PETROL-MINERAL OIL-LANOLIN) 0.1-0.1 % OINT Apply to eye.   zinc oxide 20 % ointment Apply 1 application topically as needed for irritation. apply to buttocks/peri, topical, As Needed, To buttocks after every incontinent episode and as needed for redness. May keep at bedside.   No facility-administered encounter medications on file as of 05/09/2021.    Review of Systems:  Review of Systems  Constitutional:  Negative for activity change and appetite change.  HENT: Negative.    Respiratory:  Negative for cough and shortness of breath.   Cardiovascular:  Negative for leg swelling.  Gastrointestinal:  Positive for abdominal distention and diarrhea. Negative for constipation.  Genitourinary: Negative.   Musculoskeletal:  Negative for arthralgias, gait problem and myalgias.  Skin: Negative.   Neurological:  Positive for weakness. Negative for dizziness.  Psychiatric/Behavioral:  Negative for confusion, dysphoric mood and sleep disturbance.    Health Maintenance  Topic Date Due   Zoster Vaccines- Shingrix (1 of 2) Never done    COVID-19 Vaccine (5 - Booster) 03/28/2021   TETANUS/TDAP  10/17/2029   Pneumonia Vaccine 57+ Years old  Completed   INFLUENZA VACCINE  Completed   DEXA SCAN  Completed   HPV VACCINES  Aged Out    Physical Exam: Vitals:   05/09/21 1353  BP: (!) 129/53  Pulse: 76  Temp: (!) 97.4 F (36.3 C)  SpO2: 100%  Weight: 102 lb 1.6 oz (46.3 kg)  Height: '5\' 4"'  (1.626 m)   Body mass index is 17.53 kg/m. Physical Exam Vitals reviewed.  Constitutional:      Appearance: Normal appearance.     Comments: Very frail  HENT:     Head: Normocephalic.     Nose: Nose normal.     Mouth/Throat:     Mouth: Mucous membranes are moist.     Pharynx: Oropharynx is clear.  Eyes:     Pupils: Pupils are equal, round, and reactive to light.  Cardiovascular:     Rate and Rhythm: Normal rate and regular rhythm.     Pulses: Normal  pulses.     Heart sounds: Normal heart sounds. No murmur heard. Pulmonary:     Effort: Pulmonary effort is normal.     Breath sounds: Normal breath sounds.  Abdominal:     General: Abdomen is flat. Bowel sounds are normal.     Palpations: Abdomen is soft.  Musculoskeletal:        General: Swelling present.     Cervical back: Neck supple.  Skin:    General: Skin is warm.  Neurological:     General: No focal deficit present.     Mental Status: She is alert and oriented to person, place, and time.  Psychiatric:        Mood and Affect: Mood normal.        Thought Content: Thought content normal.    Labs reviewed: Basic Metabolic Panel: Recent Labs    02/12/21 1417 03/21/21 1244 04/18/21 1528  NA 139 137 138  K 4.8 4.5 4.6  CL 105 105 102  CO2 '25 26 26  ' GLUCOSE 107* 121* 104*  BUN 38* 34* 34*  CREATININE 1.39* 1.34* 1.33*  CALCIUM 11.1* 10.8* 10.7*   Liver Function Tests: Recent Labs    02/12/21 1417 03/21/21 1244 04/18/21 1528  AST '20 21 18  ' ALT '15 16 15  ' ALKPHOS 51 66 68  BILITOT 0.4 0.4 0.3  PROT 7.4 7.0 7.4  ALBUMIN 4.3 3.9 3.6   No results for  input(s): LIPASE, AMYLASE in the last 8760 hours. No results for input(s): AMMONIA in the last 8760 hours. CBC: Recent Labs    02/12/21 1417 03/21/21 1244 04/18/21 1528  WBC 6.3 6.5 8.4  NEUTROABS 4.7 4.8 6.5  HGB 11.8* 11.9* 11.8*  HCT 35.2* 35.9* 35.9*  MCV 93.6 95.2 93.5  PLT 155 196 302   Lipid Panel: No results for input(s): CHOL, HDL, LDLCALC, TRIG, CHOLHDL, LDLDIRECT in the last 8760 hours. No results found for: HGBA1C  Procedures since last visit: No results found.  Assessment/Plan 1. Essential hypertension On Norvasc and Toprol and Aldactone - BMP with eGFR([facility]); Future  2. Malignant neoplasm of both lungs ([place]) Follows with Oncology Has been stable on Tagrisso Repeat CT pending  3. Mixed hyperlipidemia Continue statin - Lipid Panel; Future  4. Gastroesophageal reflux disease, unspecified whether esophagitis present On Pepcid prn  5. Bilateral leg edema Only on Aldactone  6. Weight loss Stabilized recently    Labs/tests ordered:  * No order type specified * Next appt:  05/21/2021

## 2021-05-14 ENCOUNTER — Other Ambulatory Visit (HOSPITAL_COMMUNITY): Payer: Self-pay

## 2021-05-17 ENCOUNTER — Ambulatory Visit (HOSPITAL_COMMUNITY)
Admission: RE | Admit: 2021-05-17 | Discharge: 2021-05-17 | Disposition: A | Discharge status: Home / Self Care | Payer: Medicare PPO | Source: Ambulatory Visit | Attending: Internal Medicine | Admitting: Internal Medicine

## 2021-05-17 ENCOUNTER — Other Ambulatory Visit (HOSPITAL_COMMUNITY): Payer: Self-pay

## 2021-05-17 ENCOUNTER — Other Ambulatory Visit: Payer: Self-pay

## 2021-05-17 ENCOUNTER — Inpatient Hospital Stay: Payer: Medicare PPO | Attending: Physician Assistant

## 2021-05-17 ENCOUNTER — Other Ambulatory Visit: Payer: Self-pay | Admitting: Internal Medicine

## 2021-05-17 DIAGNOSIS — K8689 Other specified diseases of pancreas: Secondary | ICD-10-CM | POA: Diagnosis not present

## 2021-05-17 DIAGNOSIS — R911 Solitary pulmonary nodule: Secondary | ICD-10-CM | POA: Diagnosis not present

## 2021-05-17 DIAGNOSIS — C3490 Malignant neoplasm of unspecified part of unspecified bronchus or lung: Secondary | ICD-10-CM | POA: Diagnosis not present

## 2021-05-17 DIAGNOSIS — R918 Other nonspecific abnormal finding of lung field: Secondary | ICD-10-CM | POA: Diagnosis not present

## 2021-05-17 DIAGNOSIS — N281 Cyst of kidney, acquired: Secondary | ICD-10-CM | POA: Diagnosis not present

## 2021-05-17 DIAGNOSIS — K769 Liver disease, unspecified: Secondary | ICD-10-CM | POA: Diagnosis not present

## 2021-05-17 LAB — CBC WITH DIFFERENTIAL (CANCER CENTER ONLY)
Abs Immature Granulocytes: 0.02 10*3/uL (ref 0.00–0.07)
Basophils Absolute: 0 10*3/uL (ref 0.0–0.1)
Basophils Relative: 1 %
Eosinophils Absolute: 0.4 10*3/uL (ref 0.0–0.5)
Eosinophils Relative: 6 %
HCT: 34.6 % — ABNORMAL LOW (ref 36.0–46.0)
Hemoglobin: 11.4 g/dL — ABNORMAL LOW (ref 12.0–15.0)
Immature Granulocytes: 0 %
Lymphocytes Relative: 14 %
Lymphs Abs: 0.9 10*3/uL (ref 0.7–4.0)
MCH: 30.9 pg (ref 26.0–34.0)
MCHC: 32.9 g/dL (ref 30.0–36.0)
MCV: 93.8 fL (ref 80.0–100.0)
Monocytes Absolute: 0.6 10*3/uL (ref 0.1–1.0)
Monocytes Relative: 9 %
Neutro Abs: 4.6 10*3/uL (ref 1.7–7.7)
Neutrophils Relative %: 70 %
Platelet Count: 179 10*3/uL (ref 150–400)
RBC: 3.69 MIL/uL — ABNORMAL LOW (ref 3.87–5.11)
RDW: 15 % (ref 11.5–15.5)
WBC Count: 6.5 10*3/uL (ref 4.0–10.5)
nRBC: 0 % (ref 0.0–0.2)

## 2021-05-17 LAB — CMP (CANCER CENTER ONLY)
ALT: 15 U/L (ref 0–44)
AST: 19 U/L (ref 15–41)
Albumin: 3.9 g/dL (ref 3.5–5.0)
Alkaline Phosphatase: 50 U/L (ref 38–126)
Anion gap: 3 — ABNORMAL LOW (ref 5–15)
BUN: 33 mg/dL — ABNORMAL HIGH (ref 8–23)
CO2: 29 mmol/L (ref 22–32)
Calcium: 10.6 mg/dL — ABNORMAL HIGH (ref 8.9–10.3)
Chloride: 105 mmol/L (ref 98–111)
Creatinine: 1.3 mg/dL — ABNORMAL HIGH (ref 0.44–1.00)
GFR, Estimated: 40 mL/min — ABNORMAL LOW (ref >60)
Glucose, Bld: 119 mg/dL — ABNORMAL HIGH (ref 70–99)
Potassium: 4.4 mmol/L (ref 3.5–5.1)
Sodium: 137 mmol/L (ref 135–145)
Total Bilirubin: 0.4 mg/dL (ref 0.3–1.2)
Total Protein: 6.5 g/dL (ref 6.5–8.1)

## 2021-05-17 MED ORDER — IOHEXOL 350 MG/ML SOLN
60.0000 mL | Freq: Once | INTRAVENOUS | Status: AC | PRN
Start: 2021-05-17 — End: 2021-05-17
  Administered 2021-05-17: 60 mL via INTRAVENOUS

## 2021-05-17 MED ORDER — IOHEXOL 9 MG/ML PO SOLN: 500.0000 mL | ORAL | Status: AC

## 2021-05-17 MED ORDER — SODIUM CHLORIDE (PF) 0.9 % IJ SOLN
INTRAMUSCULAR | Status: AC
  Filled 2021-05-17: qty 50

## 2021-05-17 MED ORDER — IOHEXOL 9 MG/ML PO SOLN
ORAL | Status: AC
  Filled 2021-05-17: qty 1000

## 2021-05-18 ENCOUNTER — Encounter: Payer: Self-pay | Admitting: Internal Medicine

## 2021-05-18 ENCOUNTER — Telehealth: Payer: Self-pay

## 2021-05-18 ENCOUNTER — Other Ambulatory Visit (HOSPITAL_COMMUNITY): Payer: Self-pay

## 2021-05-18 NOTE — Telephone Encounter (Signed)
Oral Oncology Patient Advocate Encounter   Was successful in securing patient an $57 grant from [facility]) to provide copayment coverage for Tagrisso.  This will keep the out of pocket expense at $0.     The billing information is as follows and has been shared with [place].   Member ID: [id number] Group ID: [id number] RxBin: [id number] Dates of Eligibility: 02/17/21 through 05/17/22  Fund:  [place] [facility] [place] Phone [phone] Fax [phone] 05/18/2021 5:07 PM

## 2021-05-21 ENCOUNTER — Other Ambulatory Visit: Payer: Self-pay

## 2021-05-21 DIAGNOSIS — I10 Essential (primary) hypertension: Secondary | ICD-10-CM

## 2021-05-21 DIAGNOSIS — E782 Mixed hyperlipidemia: Secondary | ICD-10-CM

## 2021-05-21 LAB — BASIC METABOLIC PANEL WITH GFR
BUN/Creatinine Ratio: 28 (calc) — ABNORMAL HIGH (ref 6–22)
BUN: 34 mg/dL — ABNORMAL HIGH (ref 7–25)
CO2: 25 mmol/L (ref 20–32)
Calcium: 10.6 mg/dL — ABNORMAL HIGH (ref 8.6–10.4)
Chloride: 103 mmol/L (ref 98–110)
Creat: 1.22 mg/dL — ABNORMAL HIGH (ref 0.60–0.95)
Glucose, Bld: 84 mg/dL (ref 65–99)
Potassium: 4.9 mmol/L (ref 3.5–5.3)
Sodium: 136 mmol/L (ref 135–146)
eGFR: 43 mL/min/{1.73_m2} — ABNORMAL LOW (ref >=60)

## 2021-05-21 LAB — LIPID PANEL
Cholesterol: 151 mg/dL (ref <200)
HDL: 64 mg/dL (ref >=50)
LDL Cholesterol (Calc): 70 mg/dL (calc)
Non-HDL Cholesterol (Calc): 87 mg/dL (calc) (ref <130)
Total CHOL/HDL Ratio: 2.4 (calc) (ref <5.0)
Triglycerides: 83 mg/dL (ref <150)

## 2021-05-22 ENCOUNTER — Inpatient Hospital Stay: Payer: Medicare PPO | Admitting: Internal Medicine

## 2021-05-22 ENCOUNTER — Other Ambulatory Visit: Payer: Self-pay | Admitting: Physician Assistant

## 2021-05-22 NOTE — Progress Notes (Deleted)
Established Patient Office Visit  Subjective:  Patient ID: Shelley Flores, female    DOB: [birth year]  Age: 88 y.o. MRN: [id number]  CC: No chief complaint on file.   HPI Shelley Flores presents for ***  Past Medical History:  Diagnosis Date   Bruit    Abdominal bruit - Abdominal aorta/renal duplex Doppler evaluation 12/07/03 -Mildly abnormal evaluation. 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 This is consistant w/median arcuate ligament compression syndrome. *Right & Left Kidney: Essentially equal in size, symmetrical in shape w/no significant abnormalities visualized. *Right & Left Renal Arteries: No significant abnormalities.   Cancer ([place])    Edema extremities    LE edema    H/O myocardial perfusion scan 02/20/00   To rule out ischemia - Negative adequate Bruce protocol exercise stress test with a deconditioned exercise response and normal static myocardial perfusion images with EF calculated by QGS of 77%. Represents a low risk study.   Hyperlipidemia    Hypertension    Osteoarthritis    Osteoporosis    Pleural effusion 04/2020   Valvular heart disease    Mild valvular heart disease by Echo in 2010, all mild and symptomatic, including concentric LVH, MR, TR, and AI with pulmonary artery pressure of 32. EF was normal.    Past Surgical History:  Procedure Laterality Date   Abdominal aorta/Renal duplex Doppler Evaluation  12/07/03   For abdominal bruit. Mildly abnormal evaluation. (See bruit in medical history)   BREAST EXCISIONAL BIOPSY Left 1966   BREAST EXCISIONAL BIOPSY Left 1999   CATARACT EXTRACTION  2003   CHEST TUBE INSERTION N/A 07/04/2020   Procedure: REMOVAL PLEURAL DRAINAGE CATHETER;  Surgeon: Garner Nash, DO;  Location: [facility];  Service: Pulmonary;  Laterality: N/A;   COLONOSCOPY  10/22/2004   DEXA Bone Scan  06/23/2013   IR PERC PLEURAL DRAIN W/INDWELL CATH W/IMG GUIDE  05/10/2020   IR THORACENTESIS ASP PLEURAL SPACE W/IMG GUIDE   05/04/2020    Family History  Problem Relation Age of Onset   Leukemia Mother 53   Cancer Father        esophagial cancer   Heart failure Maternal Grandmother    Tuberculosis Maternal Grandfather    Heart disease Paternal Grandmother    Cancer Paternal Grandfather     Social History   Socioeconomic History   Marital status: Single    Spouse name: Not on file   Number of children: Not on file   Years of education: Not on file   Highest education level: Not on file  Occupational History   Occupation: Retired    Fish farm manager: [employer]    Comment: Chemistry professor  Tobacco Use   Smoking status: Former    Packs/day: 0.25    Years: 6.00    Pack years: 1.50    Types: Cigarettes    Quit date: 05/13/1962    Years since quitting: 59.0   Smokeless tobacco: Never  Vaping Use   Vaping Use: Never used  Substance and Sexual Activity   Alcohol use: Yes    Alcohol/week: 2.0 standard drinks    Types: 2 Standard drinks or equivalent per week    Comment: eine   Drug use: No   Sexual activity: Not on file  Other Topics Concern   Not on file  Social History Narrative   Not on file   Social Determinants of Health   Financial Resource Strain: Not on file  Food Insecurity: Not on file  Transportation  Needs: Not on file  Physical Activity: Not on file  Stress: Not on file  Social Connections: Not on file  Intimate Partner Violence: Not on file    Outpatient Medications Prior to Visit  Medication Sig Dispense Refill   acetaminophen (TYLENOL) 325 MG tablet Take 2 tablets (650 mg total) by mouth every 6 (six) hours as needed for mild pain (or Fever >/= 101). 20 tablet 0   amLODipine (NORVASC) 2.5 MG tablet TAKE 1 TABLET BY MOUTH EVERY DAY 90 tablet 1   aspirin 81 MG tablet Take 81 mg by mouth daily.     atorvastatin (LIPITOR) 20 MG tablet TAKE 1 TABLET BY MOUTH EVERYDAY AT BEDTIME 90 tablet 1   loperamide (IMODIUM) 2 MG capsule Take by mouth as needed for diarrhea or loose  stools. (Patient not taking: Reported on 04/18/2021)     metoprolol succinate (TOPROL-XL) 50 MG 24 hr tablet Take 50 mg by mouth daily. Take with or immediately following a meal.     Multiple Vitamin (MULTIVITAMIN) tablet Take 1 tablet by mouth daily.     osimertinib mesylate (TAGRISSO) 40 MG tablet Take 1 tablet (40 mg total) by mouth daily. 30 tablet 3   Polyethyl Glycol-Propyl Glycol (SYSTANE HYDRATION PF OP) Apply to eye.     spironolactone (ALDACTONE) 25 MG tablet Take 1 tablet (25 mg total) by mouth daily. 90 tablet 3   White Petrolatum-Mineral Oil ([place] PETROL-MINERAL OIL-LANOLIN) 0.1-0.1 % OINT Apply to eye.     zinc oxide 20 % ointment Apply 1 application topically as needed for irritation. apply to buttocks/peri, topical, As Needed, To buttocks after every incontinent episode and as needed for redness. May keep at bedside.     No facility-administered medications prior to visit.    Allergies  Allergen Reactions   Irbesartan Nausea Only    ROS Review of Systems    Objective:    Physical Exam  BP (!) 143/59 (BP Location: Left Arm, Patient Position: Sitting) Comment: nurse notified   Pulse 74    Temp 97.6 F (36.4 C) (Axillary)    Resp 15    Ht _0  (1.626 m)    Wt 103 lb 11.2 oz (47 kg)    SpO2 100%    BMI 17.80 kg/m  Wt Readings from Last 3 Encounters:  05/23/21 103 lb 11.2 oz (47 kg)  05/09/21 102 lb 1.6 oz (46.3 kg)  04/18/21 102 lb 8 oz (46.5 kg)     Health Maintenance Due  Topic Date Due   Zoster Vaccines- Shingrix (1 of 2) Never done   COVID-19 Vaccine (5 - Booster) 03/28/2021    There are no preventive care reminders to display for this patient.  No results found for: TSH Lab Results  Component Value Date   WBC 6.5 05/17/2021   HGB 11.4 (L) 05/17/2021   HCT 34.6 (L) 05/17/2021   MCV 93.8 05/17/2021   PLT 179 05/17/2021   Lab Results  Component Value Date   NA 136 05/21/2021   K 4.9 05/21/2021   CO2 25 05/21/2021   GLUCOSE 84 05/21/2021   BUN 34  (H) 05/21/2021   CREATININE 1.22 (H) 05/21/2021   BILITOT 0.4 05/17/2021   ALKPHOS 50 05/17/2021   AST 19 05/17/2021   ALT 15 05/17/2021   PROT 6.5 05/17/2021   ALBUMIN 3.9 05/17/2021   CALCIUM 10.6 (H) 05/21/2021   ANIONGAP 3 (L) 05/17/2021   EGFR 43 (L) 05/21/2021   Lab Results  Component Value Date  CHOL 151 05/21/2021   Lab Results  Component Value Date   HDL 64 05/21/2021   Lab Results  Component Value Date   LDLCALC 70 05/21/2021   Lab Results  Component Value Date   TRIG 83 05/21/2021   Lab Results  Component Value Date   CHOLHDL 2.4 05/21/2021   No results found for: HGBA1C    Assessment & Plan:   Problem List Items Addressed This Visit       Oncology   Adenocarcinoma of left lung, stage 4 ([place]) - Primary   Relevant Orders   CBC with Differential ([place])   CMP ([facility] only)    No orders of the defined types were placed in this encounter.   Follow-up: No follow-ups on file.    [facility] OFFICE PROGRESS NOTE  [name], MD [place] [id number]  DIAGNOSIS:  Stage IV (T2b, N2, M1c), non-small cell lung cancer, adenocarcinoma.  The patient presented with a left upper lobe mass, right supra clavicular, left hilar and subcarinal lymphadenopathy as well as diffuse left-sided pleural disease with associated malignant left pleural effusion and 2 small lymph nodes near the SMA in addition to multiple brain metastasis diagnosed in December 2021.    Molecular studies by [facility] showed positive EGFR mutation with deletion in exon 19  PRIOR THERAPY: None  CURRENT THERAPY: Tagrisso 80 mg p.o. daily. First dose June 10, 2020.  Status post 9 months of treatment.  Starting October 2022 the patient will be treated with Tagrisso 40 mg p.o. daily because of the intolerance.  INTERVAL HISTORY: Shelley Flores 88 y.o. female returns to the clinic today for a follow-up visit.  The patient is  feeling "great" today without any concerning complaints.  She actually notes that compared to when she was diagnosed approximately 1 year ago that this is the best she has felt since her diagnosis.  She reports particularly in the last 2 to 3 weeks she has been feeling very well.  She used to have issues with early satiety, decreased appetite, bloating, and erratic bowel habits and a lots of gas.  She reports that this is all better.  She gained 1 pound since her last appointment.  She is tolerating her dose reduced Tagrisso fairly well without any concerning adverse side effects.  She denies any fever, chills, night sweats, or unexplained weight loss.  She denies any chest pain, cough, or hemoptysis.  She reports that she can tell that her shortness of breath is significantly better from her diagnosis when she had a a malignant pleural effusion.  She still sometimes reports some left-sided rib discomfort.  She denies any nausea or vomiting. Denies any headache or visual changes.  Denies any bone pain/back pain.  Denies any rashes or skin changes.  The patient recently had a restaging CT scan performed.  She is here today for evaluation to review her scan results.  MEDICAL HISTORY: Past Medical History:  Diagnosis Date   Bruit    Abdominal bruit - Abdominal aorta/renal duplex Doppler evaluation 12/07/03 -Mildly abnormal evaluation. 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 This is consistant w/median arcuate ligament compression syndrome. *Right & Left Kidney: Essentially equal in size, symmetrical in shape w/no significant abnormalities visualized. *Right & Left Renal Arteries: No significant abnormalities.   Cancer ([place])    Edema extremities    LE edema    H/O myocardial perfusion scan 02/20/00   To rule  out ischemia - Negative adequate Bruce protocol exercise stress test with a deconditioned exercise response and normal static myocardial perfusion images with EF calculated by QGS of 77%.  Represents a low risk study.   Hyperlipidemia    Hypertension    Osteoarthritis    Osteoporosis    Pleural effusion 04/2020   Valvular heart disease    Mild valvular heart disease by Echo in 2010, all mild and symptomatic, including concentric LVH, MR, TR, and AI with pulmonary artery pressure of 32. EF was normal.    ALLERGIES:  is allergic to irbesartan.  MEDICATIONS:  Current Outpatient Medications  Medication Sig Dispense Refill   acetaminophen (TYLENOL) 325 MG tablet Take 2 tablets (650 mg total) by mouth every 6 (six) hours as needed for mild pain (or Fever >/= 101). 20 tablet 0   amLODipine (NORVASC) 2.5 MG tablet TAKE 1 TABLET BY MOUTH EVERY DAY 90 tablet 1   aspirin 81 MG tablet Take 81 mg by mouth daily.     atorvastatin (LIPITOR) 20 MG tablet TAKE 1 TABLET BY MOUTH EVERYDAY AT BEDTIME 90 tablet 1   loperamide (IMODIUM) 2 MG capsule Take by mouth as needed for diarrhea or loose stools. (Patient not taking: Reported on 04/18/2021)     metoprolol succinate (TOPROL-XL) 50 MG 24 hr tablet Take 50 mg by mouth daily. Take with or immediately following a meal.     Multiple Vitamin (MULTIVITAMIN) tablet Take 1 tablet by mouth daily.     osimertinib mesylate (TAGRISSO) 40 MG tablet Take 1 tablet (40 mg total) by mouth daily. 30 tablet 3   Polyethyl Glycol-Propyl Glycol (SYSTANE HYDRATION PF OP) Apply to eye.     spironolactone (ALDACTONE) 25 MG tablet Take 1 tablet (25 mg total) by mouth daily. 90 tablet 3   White Petrolatum-Mineral Oil ([place] PETROL-MINERAL OIL-LANOLIN) 0.1-0.1 % OINT Apply to eye.     zinc oxide 20 % ointment Apply 1 application topically as needed for irritation. apply to buttocks/peri, topical, As Needed, To buttocks after every incontinent episode and as needed for redness. May keep at bedside.     No current facility-administered medications for this visit.    SURGICAL HISTORY:  Past Surgical History:  Procedure Laterality Date   Abdominal aorta/Renal duplex  Doppler Evaluation  12/07/03   For abdominal bruit. Mildly abnormal evaluation. (See bruit in medical history)   BREAST EXCISIONAL BIOPSY Left 1966   BREAST EXCISIONAL BIOPSY Left 1999   CATARACT EXTRACTION  2003   CHEST TUBE INSERTION N/A 07/04/2020   Procedure: REMOVAL PLEURAL DRAINAGE CATHETER;  Surgeon: Garner Nash, DO;  Location: [facility];  Service: Pulmonary;  Laterality: N/A;   COLONOSCOPY  10/22/2004   DEXA Bone Scan  06/23/2013   IR PERC PLEURAL DRAIN W/INDWELL CATH W/IMG GUIDE  05/10/2020   IR THORACENTESIS ASP PLEURAL SPACE W/IMG GUIDE  05/04/2020    REVIEW OF SYSTEMS:   Review of Systems  Constitutional: Negative for appetite change, chills, fatigue, fever and unexpected weight change.  HENT: Negative for mouth sores, nosebleeds, sore throat and trouble swallowing.   Eyes: Negative for eye problems and icterus.  Respiratory: Negative for cough, hemoptysis, shortness of breath and wheezing.   Cardiovascular: Negative for chest pain and leg swelling.  Gastrointestinal: Negative for abdominal pain, constipation, diarrhea, nausea and vomiting.  Genitourinary: Negative for bladder incontinence, difficulty urinating, dysuria, frequency and hematuria.   Musculoskeletal: Negative for back pain, gait problem, neck pain and neck stiffness.  Skin: Negative for itching  and rash.  Neurological: Negative for dizziness, extremity weakness, gait problem, headaches, light-headedness and seizures.  Hematological: Negative for adenopathy. Does not bruise/bleed easily.  Psychiatric/Behavioral: Negative for confusion, depression and sleep disturbance. The patient is not nervous/anxious.     PHYSICAL EXAMINATION:  Blood pressure (!) 143/59, pulse 74, temperature 97.6 F (36.4 C), temperature source Axillary, resp. rate 15, height $RemoveBe'5\' 4"'[id number]$  (1.626 m), weight 103 lb 11.2 oz (47 kg), SpO2 100 %.  ECOG PERFORMANCE STATUS: 1  Physical Exam  Constitutional: Oriented to person, place, and time  and thin appearing female well-nourished, and in no distress.  HENT:  Head: Normocephalic and atraumatic.  Mouth/Throat: Oropharynx is clear and moist. No oropharyngeal exudate.  Eyes: Conjunctivae are normal. Right eye exhibits no discharge. Left eye exhibits no discharge. No scleral icterus.  Neck: Normal range of motion. Neck supple.  Cardiovascular: Normal rate, regular rhythm, normal heart sounds and intact distal pulses.   Pulmonary/Chest: Effort normal and breath sounds normal. No respiratory distress. No wheezes. No rales.  Abdominal: Soft. Bowel sounds are normal. Exhibits no distension and no mass. There is no tenderness.  Musculoskeletal: Normal range of motion. Exhibits no edema.  Lymphadenopathy:    No cervical adenopathy.  Neurological: Alert and oriented to person, place, and time. Exhibits normal muscle tone. Gait normal. Coordination normal.  Skin: Skin is warm and dry. No rash noted. Not diaphoretic. No erythema. No pallor.  Psychiatric: Mood, memory and judgment normal.  Vitals reviewed.  LABORATORY DATA: Lab Results  Component Value Date   WBC 6.5 05/17/2021   HGB 11.4 (L) 05/17/2021   HCT 34.6 (L) 05/17/2021   MCV 93.8 05/17/2021   PLT 179 05/17/2021      Chemistry      Component Value Date/Time   NA 136 05/21/2021 0800   NA 137 05/25/2020 0000   K 4.9 05/21/2021 0800   CL 103 05/21/2021 0800   CO2 25 05/21/2021 0800   BUN 34 (H) 05/21/2021 0800   BUN 12 05/25/2020 0000   CREATININE 1.22 (H) 05/21/2021 0800   GLU 83 05/25/2020 0000      Component Value Date/Time   CALCIUM 10.6 (H) 05/21/2021 0800   ALKPHOS 50 05/17/2021 0856   AST 19 05/17/2021 0856   ALT 15 05/17/2021 0856   BILITOT 0.4 05/17/2021 0856       RADIOGRAPHIC STUDIES:  CT Chest W Contrast  Result Date: 05/18/2021 CLINICAL DATA:  Restaging non-small cell lung cancer. No current complaints. EXAM: CT CHEST, ABDOMEN, AND PELVIS WITH CONTRAST TECHNIQUE: Multidetector CT imaging of the  chest, abdomen and pelvis was performed following the standard protocol during bolus administration of intravenous contrast. CONTRAST:  32mL OMNIPAQUE IOHEXOL 350 MG/ML SOLN COMPARISON:  CT 02/12/2021 and 11/17/2020. PET-CT 05/31/2020. FINDINGS: CT CHEST FINDINGS Cardiovascular: No acute vascular findings. Mild atherosclerosis of the aorta, great vessels and coronary arteries again noted. There are calcifications of the aortic valve and mitral annulus. The heart size is normal. There is no pericardial effusion. Mediastinum/Nodes: There are no enlarged mediastinal, hilar or axillary lymph nodes. Small mediastinal and axillary lymph nodes are stable. The thyroid gland, trachea and esophagus appear stable without significant findings. Lungs/Pleura: No pleural effusion or pneumothorax. The right middle lobe nodule appears slightly smaller, partially obscured by adjacent atelectasis or scarring. It currently measures approximately 6 x 6 mm on image 90/4 (previously 7 x 5 mm). Band like density medially in the left upper lobe appears unchanged with a nodular component measuring up to 1.7  x 1.3 cm on image 73/4. Scattered additional tiny pulmonary nodules are unchanged. No new or enlarging nodules are seen. Musculoskeletal/Chest wall: No chest wall mass. The sclerotic lesion centrally in the T5 vertebral body is stable. The other sclerotic lesion more posteriorly near the left pedicle has slightly enlarged, now measuring 9 mm on image 38/4, previously 7 mm maximally. No lytic lesion or pathologic fracture identified. CT ABDOMEN AND PELVIS FINDINGS Hepatobiliary: The liver is normal in density without suspicious focal abnormality. Scattered small hypodense lesions are stable, likely all cysts. No evidence of gallstones, gallbladder wall thickening or biliary dilatation. Pancreas: Stable 9 mm low-density lesion in the pancreatic tail on image 70/2. No pancreatic ductal dilatation or surrounding inflammatory change. Spleen:  Normal in size without focal abnormality. Adrenals/Urinary Tract: The adrenal glands appear stable without suspicious findings. Bilateral renal cysts appear unchanged. No evidence of enhancing renal mass, urinary tract calculus or hydronephrosis. The bladder appears unremarkable. Stomach/Bowel: Enteric contrast was administered and has passed into the distal small bowel. The stomach appears unremarkable for its degree of distension. No evidence of bowel wall thickening, distention or surrounding inflammatory change. Vascular/Lymphatic: There are no enlarged abdominal or pelvic lymph nodes. Small retroperitoneal and inguinal lymph nodes appear unchanged. There is aortic and branch vessel atherosclerosis without acute vascular findings. Reproductive: Hysterectomy.  No adnexal mass. Other: No evidence of abdominal wall mass or hernia. No ascites. Musculoskeletal: Scattered sclerotic lesions within the L3 vertebral body left iliac bone and right acetabulum are stable. No new or enlarging lesions are identified. No evidence of lytic lesion or pathologic fracture. IMPRESSION: 1. Progressive enlargement of sclerotic lesion posteriorly in the T5 vertebral body near the left pedicle suspicious for metastasis, likely a at least partially treated based on sclerosis. Additional scattered sclerotic lesions are unchanged. 2. Interval decreased size of right middle lobe nodule. Nodular density in the left upper lobe has not significantly changed. 3. No other evidence of metastatic disease identified in the chest, abdomen or pelvis. 4. Stable low-density lesions within the liver and kidneys, likely incidental cysts. Low-density lesion in the pancreatic tail is also unchanged and likely benign/indolent. Attention on follow-up recommended. Electronically Signed   By: Richardean Sale M.D.   On: 05/18/2021 09:12   CT Abdomen Pelvis W Contrast  Result Date: 05/18/2021 CLINICAL DATA:  Restaging non-small cell lung cancer. No current  complaints. EXAM: CT CHEST, ABDOMEN, AND PELVIS WITH CONTRAST TECHNIQUE: Multidetector CT imaging of the chest, abdomen and pelvis was performed following the standard protocol during bolus administration of intravenous contrast. CONTRAST:  49mL OMNIPAQUE IOHEXOL 350 MG/ML SOLN COMPARISON:  CT 02/12/2021 and 11/17/2020. PET-CT 05/31/2020. FINDINGS: CT CHEST FINDINGS Cardiovascular: No acute vascular findings. Mild atherosclerosis of the aorta, great vessels and coronary arteries again noted. There are calcifications of the aortic valve and mitral annulus. The heart size is normal. There is no pericardial effusion. Mediastinum/Nodes: There are no enlarged mediastinal, hilar or axillary lymph nodes. Small mediastinal and axillary lymph nodes are stable. The thyroid gland, trachea and esophagus appear stable without significant findings. Lungs/Pleura: No pleural effusion or pneumothorax. The right middle lobe nodule appears slightly smaller, partially obscured by adjacent atelectasis or scarring. It currently measures approximately 6 x 6 mm on image 90/4 (previously 7 x 5 mm). Band like density medially in the left upper lobe appears unchanged with a nodular component measuring up to 1.7 x 1.3 cm on image 73/4. Scattered additional tiny pulmonary nodules are unchanged. No new or enlarging nodules are seen.  Musculoskeletal/Chest wall: No chest wall mass. The sclerotic lesion centrally in the T5 vertebral body is stable. The other sclerotic lesion more posteriorly near the left pedicle has slightly enlarged, now measuring 9 mm on image 38/4, previously 7 mm maximally. No lytic lesion or pathologic fracture identified. CT ABDOMEN AND PELVIS FINDINGS Hepatobiliary: The liver is normal in density without suspicious focal abnormality. Scattered small hypodense lesions are stable, likely all cysts. No evidence of gallstones, gallbladder wall thickening or biliary dilatation. Pancreas: Stable 9 mm low-density lesion in the  pancreatic tail on image 70/2. No pancreatic ductal dilatation or surrounding inflammatory change. Spleen: Normal in size without focal abnormality. Adrenals/Urinary Tract: The adrenal glands appear stable without suspicious findings. Bilateral renal cysts appear unchanged. No evidence of enhancing renal mass, urinary tract calculus or hydronephrosis. The bladder appears unremarkable. Stomach/Bowel: Enteric contrast was administered and has passed into the distal small bowel. The stomach appears unremarkable for its degree of distension. No evidence of bowel wall thickening, distention or surrounding inflammatory change. Vascular/Lymphatic: There are no enlarged abdominal or pelvic lymph nodes. Small retroperitoneal and inguinal lymph nodes appear unchanged. There is aortic and branch vessel atherosclerosis without acute vascular findings. Reproductive: Hysterectomy.  No adnexal mass. Other: No evidence of abdominal wall mass or hernia. No ascites. Musculoskeletal: Scattered sclerotic lesions within the L3 vertebral body left iliac bone and right acetabulum are stable. No new or enlarging lesions are identified. No evidence of lytic lesion or pathologic fracture. IMPRESSION: 1. Progressive enlargement of sclerotic lesion posteriorly in the T5 vertebral body near the left pedicle suspicious for metastasis, likely a at least partially treated based on sclerosis. Additional scattered sclerotic lesions are unchanged. 2. Interval decreased size of right middle lobe nodule. Nodular density in the left upper lobe has not significantly changed. 3. No other evidence of metastatic disease identified in the chest, abdomen or pelvis. 4. Stable low-density lesions within the liver and kidneys, likely incidental cysts. Low-density lesion in the pancreatic tail is also unchanged and likely benign/indolent. Attention on follow-up recommended. Electronically Signed   By: Richardean Sale M.D.   On: 05/18/2021 09:12      ASSESSMENT/PLAN:  This is a very pleasant 88 year old Caucasian female diagnosed with stage IV (T3, N3, M1 C) non-small cell lung cancer, adenocarcinoma.  She is positive for an EGFR mutation with deletion in exon 19.  She was diagnosed in December 2021.  She initially presented with a left lower lobe lung mass in addition to right supraclavicular, left hilar, and subcarinal lymphadenopathy.  She also has metastatic adenopathy in the abdomen and left pleural-based metastases.  She also has a malignant left pleural effusion and multiple brain metastases.  The patient is currently undergoing Tagrisso.  She started off with 80 mg p.o. daily on June 10, 2020.  She status post 9 months of treatment.  Starting from October 2022, her dose changed to 40 mg p.o. daily.  She is tolerating this well.  The patient recently had a restaging CT scan performed.  Dr. Julien Nordmann personally and independently reviewed the scan discussed results with the patient day.  The scan is stable.  The scan noted progressive enlargement of a sclerotic lesion in T5 vertebral body which is suspicious for metastasis but at least a partially treated based on the sclerosis.  Patient is not having any back pain at this time.  She will continue on the same treatment at the same dose.  We will see her back for follow-up visit in 6 weeks  for evaluation and repeat blood work.  Will then arrange for restaging CT scan in 6 weeks from her next appointment.  The patient was advised to call immediately if she has any concerning symptoms in the interval. The patient voices understanding of current disease status and treatment options and is in agreement with the current care plan. All questions were answered. The patient knows to call the clinic with any problems, questions or concerns. We can certainly see the patient much sooner if necessary   Orders Placed This Encounter  Procedures   CBC with Differential ([facility] Only)     Standing Status:   Future    Standing Expiration Date:   05/23/2022   CMP ([facility] only)    Standing Status:   Future    Standing Expiration Date:   05/23/2022      Tobe Sos Leigha Olberding, PA-C 05/23/21  ADDENDUM: Hematology/Oncology Attending:

## 2021-05-23 ENCOUNTER — Inpatient Hospital Stay: Payer: Medicare PPO | Admitting: Internal Medicine

## 2021-05-23 ENCOUNTER — Other Ambulatory Visit: Payer: Self-pay

## 2021-05-23 VITALS — BP 143/59 | HR 74 | Temp 97.6°F | Resp 15 | Ht 64.0 in | Wt 103.7 lb

## 2021-05-23 DIAGNOSIS — C3412 Malignant neoplasm of upper lobe, left bronchus or lung: Secondary | ICD-10-CM | POA: Insufficient documentation

## 2021-05-23 DIAGNOSIS — C7951 Secondary malignant neoplasm of bone: Secondary | ICD-10-CM | POA: Diagnosis not present

## 2021-05-23 DIAGNOSIS — C3492 Malignant neoplasm of unspecified part of left bronchus or lung: Secondary | ICD-10-CM | POA: Diagnosis not present

## 2021-05-23 DIAGNOSIS — C7931 Secondary malignant neoplasm of brain: Secondary | ICD-10-CM | POA: Insufficient documentation

## 2021-05-23 DIAGNOSIS — Z79899 Other long term (current) drug therapy: Secondary | ICD-10-CM | POA: Insufficient documentation

## 2021-05-23 DIAGNOSIS — Z7982 Long term (current) use of aspirin: Secondary | ICD-10-CM | POA: Insufficient documentation

## 2021-05-23 NOTE — Progress Notes (Deleted)
Established Patient Office Visit  Subjective:  Patient ID: Shelley Flores, female    DOB: [birth year]  Age: 88 y.o. MRN: [id number]  CC: No chief complaint on file.   HPI Shelley Flores presents for ***  Past Medical History:  Diagnosis Date   Bruit    Abdominal bruit - Abdominal aorta/renal duplex Doppler evaluation 12/07/03 -Mildly abnormal evaluation. 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 This is consistant w/median arcuate ligament compression syndrome. *Right & Left Kidney: Essentially equal in size, symmetrical in shape w/no significant abnormalities visualized. *Right & Left Renal Arteries: No significant abnormalities.   Cancer ([place])    Edema extremities    LE edema    H/O myocardial perfusion scan 02/20/00   To rule out ischemia - Negative adequate Bruce protocol exercise stress test with a deconditioned exercise response and normal static myocardial perfusion images with EF calculated by QGS of 77%. Represents a low risk study.   Hyperlipidemia    Hypertension    Osteoarthritis    Osteoporosis    Pleural effusion 04/2020   Valvular heart disease    Mild valvular heart disease by Echo in 2010, all mild and symptomatic, including concentric LVH, MR, TR, and AI with pulmonary artery pressure of 32. EF was normal.    Past Surgical History:  Procedure Laterality Date   Abdominal aorta/Renal duplex Doppler Evaluation  12/07/03   For abdominal bruit. Mildly abnormal evaluation. (See bruit in medical history)   BREAST EXCISIONAL BIOPSY Left 1966   BREAST EXCISIONAL BIOPSY Left 1999   CATARACT EXTRACTION  2003   CHEST TUBE INSERTION N/A 07/04/2020   Procedure: REMOVAL PLEURAL DRAINAGE CATHETER;  Surgeon: Garner Nash, DO;  Location: [facility];  Service: Pulmonary;  Laterality: N/A;   COLONOSCOPY  10/22/2004   DEXA Bone Scan  06/23/2013   IR PERC PLEURAL DRAIN W/INDWELL CATH W/IMG GUIDE  05/10/2020   IR THORACENTESIS ASP PLEURAL SPACE W/IMG GUIDE   05/04/2020    Family History  Problem Relation Age of Onset   Leukemia Mother 53   Cancer Father        esophagial cancer   Heart failure Maternal Grandmother    Tuberculosis Maternal Grandfather    Heart disease Paternal Grandmother    Cancer Paternal Grandfather     Social History   Socioeconomic History   Marital status: Single    Spouse name: Not on file   Number of children: Not on file   Years of education: Not on file   Highest education level: Not on file  Occupational History   Occupation: Retired    Fish farm manager: [employer]    Comment: Chemistry professor  Tobacco Use   Smoking status: Former    Packs/day: 0.25    Years: 6.00    Pack years: 1.50    Types: Cigarettes    Quit date: 05/13/1962    Years since quitting: 59.0   Smokeless tobacco: Never  Vaping Use   Vaping Use: Never used  Substance and Sexual Activity   Alcohol use: Yes    Alcohol/week: 2.0 standard drinks    Types: 2 Standard drinks or equivalent per week    Comment: eine   Drug use: No   Sexual activity: Not on file  Other Topics Concern   Not on file  Social History Narrative   Not on file   Social Determinants of Health   Financial Resource Strain: Not on file  Food Insecurity: Not on file  Transportation  Needs: Not on file  Physical Activity: Not on file  Stress: Not on file  Social Connections: Not on file  Intimate Partner Violence: Not on file    Outpatient Medications Prior to Visit  Medication Sig Dispense Refill   acetaminophen (TYLENOL) 325 MG tablet Take 2 tablets (650 mg total) by mouth every 6 (six) hours as needed for mild pain (or Fever >/= 101). 20 tablet 0   amLODipine (NORVASC) 2.5 MG tablet TAKE 1 TABLET BY MOUTH EVERY DAY 90 tablet 1   aspirin 81 MG tablet Take 81 mg by mouth daily.     atorvastatin (LIPITOR) 20 MG tablet TAKE 1 TABLET BY MOUTH EVERYDAY AT BEDTIME 90 tablet 1   loperamide (IMODIUM) 2 MG capsule Take by mouth as needed for diarrhea or loose  stools. (Patient not taking: Reported on 04/18/2021)     metoprolol succinate (TOPROL-XL) 50 MG 24 hr tablet Take 50 mg by mouth daily. Take with or immediately following a meal.     Multiple Vitamin (MULTIVITAMIN) tablet Take 1 tablet by mouth daily.     osimertinib mesylate (TAGRISSO) 40 MG tablet Take 1 tablet (40 mg total) by mouth daily. 30 tablet 3   Polyethyl Glycol-Propyl Glycol (SYSTANE HYDRATION PF OP) Apply to eye.     spironolactone (ALDACTONE) 25 MG tablet Take 1 tablet (25 mg total) by mouth daily. 90 tablet 3   White Petrolatum-Mineral Oil ([place] PETROL-MINERAL OIL-LANOLIN) 0.1-0.1 % OINT Apply to eye.     zinc oxide 20 % ointment Apply 1 application topically as needed for irritation. apply to buttocks/peri, topical, As Needed, To buttocks after every incontinent episode and as needed for redness. May keep at bedside.     No facility-administered medications prior to visit.    Allergies  Allergen Reactions   Irbesartan Nausea Only    ROS Review of Systems    Objective:    Physical Exam  BP (!) 143/59 (BP Location: Left Arm, Patient Position: Sitting) Comment: nurse notified   Pulse 74    Temp 97.6 F (36.4 C) (Axillary)    Resp 15    Ht _0  (1.626 m)    Wt 103 lb 11.2 oz (47 kg)    SpO2 100%    BMI 17.80 kg/m  Wt Readings from Last 3 Encounters:  05/23/21 103 lb 11.2 oz (47 kg)  05/09/21 102 lb 1.6 oz (46.3 kg)  04/18/21 102 lb 8 oz (46.5 kg)     Health Maintenance Due  Topic Date Due   Zoster Vaccines- Shingrix (1 of 2) Never done   COVID-19 Vaccine (5 - Booster) 03/28/2021    There are no preventive care reminders to display for this patient.  No results found for: TSH Lab Results  Component Value Date   WBC 6.5 05/17/2021   HGB 11.4 (L) 05/17/2021   HCT 34.6 (L) 05/17/2021   MCV 93.8 05/17/2021   PLT 179 05/17/2021   Lab Results  Component Value Date   NA 136 05/21/2021   K 4.9 05/21/2021   CO2 25 05/21/2021   GLUCOSE 84 05/21/2021   BUN 34  (H) 05/21/2021   CREATININE 1.22 (H) 05/21/2021   BILITOT 0.4 05/17/2021   ALKPHOS 50 05/17/2021   AST 19 05/17/2021   ALT 15 05/17/2021   PROT 6.5 05/17/2021   ALBUMIN 3.9 05/17/2021   CALCIUM 10.6 (H) 05/21/2021   ANIONGAP 3 (L) 05/17/2021   EGFR 43 (L) 05/21/2021   Lab Results  Component Value Date  CHOL 151 05/21/2021   Lab Results  Component Value Date   HDL 64 05/21/2021   Lab Results  Component Value Date   LDLCALC 70 05/21/2021   Lab Results  Component Value Date   TRIG 83 05/21/2021   Lab Results  Component Value Date   CHOLHDL 2.4 05/21/2021   No results found for: HGBA1C    Assessment & Plan:   Problem List Items Addressed This Visit       Oncology   Adenocarcinoma of left lung, stage 4 ([place]) - Primary   Relevant Orders   CBC with Differential ([place] [facility])   CMP ([employer] only)    No orders of the defined types were placed in this encounter.   Follow-up: No follow-ups on file.    Eilleen Kempf, MD

## 2021-05-23 NOTE — Progress Notes (Deleted)
[facility] OFFICE PROGRESS NOTE  [name], MD [place] [id number]  DIAGNOSIS:  Stage IV (T2b, N2, M1c), non-small cell lung cancer, adenocarcinoma.  The patient presented with a left upper lobe mass, right supra clavicular, left hilar and subcarinal lymphadenopathy as well as diffuse left-sided pleural disease with associated malignant left pleural effusion and 2 small lymph nodes near the SMA in addition to multiple brain metastasis diagnosed in December 2021.    Molecular studies by [facility] showed positive EGFR mutation with deletion in exon 19  PRIOR THERAPY: None  CURRENT THERAPY: Tagrisso 80 mg p.o. daily. First dose June 10, 2020.  Status post 9 months of treatment.  Starting October 2022 the patient will be treated with Tagrisso 40 mg p.o. daily because of the intolerance.  INTERVAL HISTORY: Shelley Flores 88 y.o. female returns to the clinic today for a follow-up visit.  The patient is feeling "great" today without any concerning complaints.  She actually notes that compared to when she was diagnosed approximately 1 year ago that this is the best she has felt since her diagnosis.  She reports particularly in the last 2 to 3 weeks she has been feeling very well.  She used to have issues with early satiety, decreased appetite, bloating, and erratic bowel habits and a lots of gas.  She reports that this is all better.  She gained 1 pound since her last appointment.  She is tolerating her dose reduced Tagrisso fairly well without any concerning adverse side effects.  She denies any fever, chills, night sweats, or unexplained weight loss.  She denies any chest pain, cough, or hemoptysis.  She reports that she can tell that her shortness of breath is significantly better from her diagnosis when she had a a malignant pleural effusion.  She still sometimes reports some left-sided rib discomfort.  She denies any nausea or vomiting. Denies any  headache or visual changes.  Denies any bone pain/back pain.  Denies any rashes or skin changes.  The patient recently had a restaging CT scan performed.  She is here today for evaluation to review her scan results.  MEDICAL HISTORY: Past Medical History:  Diagnosis Date   Bruit    Abdominal bruit - Abdominal aorta/renal duplex Doppler evaluation 12/07/03 -Mildly abnormal evaluation. 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 This is consistant w/median arcuate ligament compression syndrome. *Right & Left Kidney: Essentially equal in size, symmetrical in shape w/no significant abnormalities visualized. *Right & Left Renal Arteries: No significant abnormalities.   Cancer ([place])    Edema extremities    LE edema    H/O myocardial perfusion scan 02/20/00   To rule out ischemia - Negative adequate Bruce protocol exercise stress test with a deconditioned exercise response and normal static myocardial perfusion images with EF calculated by QGS of 77%. Represents a low risk study.   Hyperlipidemia    Hypertension    Osteoarthritis    Osteoporosis    Pleural effusion 04/2020   Valvular heart disease    Mild valvular heart disease by Echo in 2010, all mild and symptomatic, including concentric LVH, MR, TR, and AI with pulmonary artery pressure of 32. EF was normal.    ALLERGIES:  is allergic to irbesartan.  MEDICATIONS:  Current Outpatient Medications  Medication Sig Dispense Refill   acetaminophen (TYLENOL) 325 MG tablet Take 2 tablets (650 mg total) by mouth every 6 (six) hours as needed for mild pain (or Fever >/=  101). 20 tablet 0   amLODipine (NORVASC) 2.5 MG tablet TAKE 1 TABLET BY MOUTH EVERY DAY 90 tablet 1   aspirin 81 MG tablet Take 81 mg by mouth daily.     atorvastatin (LIPITOR) 20 MG tablet TAKE 1 TABLET BY MOUTH EVERYDAY AT BEDTIME 90 tablet 1   loperamide (IMODIUM) 2 MG capsule Take by mouth as needed for diarrhea or loose stools. (Patient not taking: Reported on  04/18/2021)     metoprolol succinate (TOPROL-XL) 50 MG 24 hr tablet Take 50 mg by mouth daily. Take with or immediately following a meal.     Multiple Vitamin (MULTIVITAMIN) tablet Take 1 tablet by mouth daily.     osimertinib mesylate (TAGRISSO) 40 MG tablet Take 1 tablet (40 mg total) by mouth daily. 30 tablet 3   Polyethyl Glycol-Propyl Glycol (SYSTANE HYDRATION PF OP) Apply to eye.     spironolactone (ALDACTONE) 25 MG tablet Take 1 tablet (25 mg total) by mouth daily. 90 tablet 3   White Petrolatum-Mineral Oil ([place] PETROL-MINERAL OIL-LANOLIN) 0.1-0.1 % OINT Apply to eye.     zinc oxide 20 % ointment Apply 1 application topically as needed for irritation. apply to buttocks/peri, topical, As Needed, To buttocks after every incontinent episode and as needed for redness. May keep at bedside.     No current facility-administered medications for this visit.    SURGICAL HISTORY:  Past Surgical History:  Procedure Laterality Date   Abdominal aorta/Renal duplex Doppler Evaluation  12/07/03   For abdominal bruit. Mildly abnormal evaluation. (See bruit in medical history)   BREAST EXCISIONAL BIOPSY Left 1966   BREAST EXCISIONAL BIOPSY Left 1999   CATARACT EXTRACTION  2003   CHEST TUBE INSERTION N/A 07/04/2020   Procedure: REMOVAL PLEURAL DRAINAGE CATHETER;  Surgeon: Garner Nash, DO;  Location: [facility];  Service: Pulmonary;  Laterality: N/A;   COLONOSCOPY  10/22/2004   DEXA Bone Scan  06/23/2013   IR PERC PLEURAL DRAIN W/INDWELL CATH W/IMG GUIDE  05/10/2020   IR THORACENTESIS ASP PLEURAL SPACE W/IMG GUIDE  05/04/2020    REVIEW OF SYSTEMS:   Review of Systems  Constitutional: Negative for appetite change, chills, fatigue, fever and unexpected weight change.  HENT: Negative for mouth sores, nosebleeds, sore throat and trouble swallowing.   Eyes: Negative for eye problems and icterus.  Respiratory: Negative for cough, hemoptysis, shortness of breath and wheezing.   Cardiovascular: Negative  for chest pain and leg swelling.  Gastrointestinal: Negative for abdominal pain, constipation, diarrhea, nausea and vomiting.  Genitourinary: Negative for bladder incontinence, difficulty urinating, dysuria, frequency and hematuria.   Musculoskeletal: Negative for back pain, gait problem, neck pain and neck stiffness.  Skin: Negative for itching and rash.  Neurological: Negative for dizziness, extremity weakness, gait problem, headaches, light-headedness and seizures.  Hematological: Negative for adenopathy. Does not bruise/bleed easily.  Psychiatric/Behavioral: Negative for confusion, depression and sleep disturbance. The patient is not nervous/anxious.     PHYSICAL EXAMINATION:  Blood pressure (!) 143/59, pulse 74, temperature 97.6 F (36.4 C), temperature source Axillary, resp. rate 15, height '5\' 4"'  (1.626 m), weight 103 lb 11.2 oz (47 kg), SpO2 100 %.  ECOG PERFORMANCE STATUS: 1  Physical Exam  Constitutional: Oriented to person, place, and time and thin appearing female well-nourished, and in no distress.  HENT:  Head: Normocephalic and atraumatic.  Mouth/Throat: Oropharynx is clear and moist. No oropharyngeal exudate.  Eyes: Conjunctivae are normal. Right eye exhibits no discharge. Left eye exhibits no discharge. No scleral icterus.  Neck: Normal range of motion. Neck supple.  Cardiovascular: Normal rate, regular rhythm, normal heart sounds and intact distal pulses.   Pulmonary/Chest: Effort normal and breath sounds normal. No respiratory distress. No wheezes. No rales.  Abdominal: Soft. Bowel sounds are normal. Exhibits no distension and no mass. There is no tenderness.  Musculoskeletal: Normal range of motion. Exhibits no edema.  Lymphadenopathy:    No cervical adenopathy.  Neurological: Alert and oriented to person, place, and time. Exhibits normal muscle tone. Gait normal. Coordination normal.  Skin: Skin is warm and dry. No rash noted. Not diaphoretic. No erythema. No pallor.   Psychiatric: Mood, memory and judgment normal.  Vitals reviewed.  LABORATORY DATA: Lab Results  Component Value Date   WBC 6.5 05/17/2021   HGB 11.4 (L) 05/17/2021   HCT 34.6 (L) 05/17/2021   MCV 93.8 05/17/2021   PLT 179 05/17/2021      Chemistry      Component Value Date/Time   NA 136 05/21/2021 0800   NA 137 05/25/2020 0000   K 4.9 05/21/2021 0800   CL 103 05/21/2021 0800   CO2 25 05/21/2021 0800   BUN 34 (H) 05/21/2021 0800   BUN 12 05/25/2020 0000   CREATININE 1.22 (H) 05/21/2021 0800   GLU 83 05/25/2020 0000      Component Value Date/Time   CALCIUM 10.6 (H) 05/21/2021 0800   ALKPHOS 50 05/17/2021 0856   AST 19 05/17/2021 0856   ALT 15 05/17/2021 0856   BILITOT 0.4 05/17/2021 0856       RADIOGRAPHIC STUDIES:  CT Chest W Contrast  Result Date: 05/18/2021 CLINICAL DATA:  Restaging non-small cell lung cancer. No current complaints. EXAM: CT CHEST, ABDOMEN, AND PELVIS WITH CONTRAST TECHNIQUE: Multidetector CT imaging of the chest, abdomen and pelvis was performed following the standard protocol during bolus administration of intravenous contrast. CONTRAST:  48m OMNIPAQUE IOHEXOL 350 MG/ML SOLN COMPARISON:  CT 02/12/2021 and 11/17/2020. PET-CT 05/31/2020. FINDINGS: CT CHEST FINDINGS Cardiovascular: No acute vascular findings. Mild atherosclerosis of the aorta, great vessels and coronary arteries again noted. There are calcifications of the aortic valve and mitral annulus. The heart size is normal. There is no pericardial effusion. Mediastinum/Nodes: There are no enlarged mediastinal, hilar or axillary lymph nodes. Small mediastinal and axillary lymph nodes are stable. The thyroid gland, trachea and esophagus appear stable without significant findings. Lungs/Pleura: No pleural effusion or pneumothorax. The right middle lobe nodule appears slightly smaller, partially obscured by adjacent atelectasis or scarring. It currently measures approximately 6 x 6 mm on image 90/4  (previously 7 x 5 mm). Band like density medially in the left upper lobe appears unchanged with a nodular component measuring up to 1.7 x 1.3 cm on image 73/4. Scattered additional tiny pulmonary nodules are unchanged. No new or enlarging nodules are seen. Musculoskeletal/Chest wall: No chest wall mass. The sclerotic lesion centrally in the T5 vertebral body is stable. The other sclerotic lesion more posteriorly near the left pedicle has slightly enlarged, now measuring 9 mm on image 38/4, previously 7 mm maximally. No lytic lesion or pathologic fracture identified. CT ABDOMEN AND PELVIS FINDINGS Hepatobiliary: The liver is normal in density without suspicious focal abnormality. Scattered small hypodense lesions are stable, likely all cysts. No evidence of gallstones, gallbladder wall thickening or biliary dilatation. Pancreas: Stable 9 mm low-density lesion in the pancreatic tail on image 70/2. No pancreatic ductal dilatation or surrounding inflammatory change. Spleen: Normal in size without focal abnormality. Adrenals/Urinary Tract: The adrenal glands appear stable  without suspicious findings. Bilateral renal cysts appear unchanged. No evidence of enhancing renal mass, urinary tract calculus or hydronephrosis. The bladder appears unremarkable. Stomach/Bowel: Enteric contrast was administered and has passed into the distal small bowel. The stomach appears unremarkable for its degree of distension. No evidence of bowel wall thickening, distention or surrounding inflammatory change. Vascular/Lymphatic: There are no enlarged abdominal or pelvic lymph nodes. Small retroperitoneal and inguinal lymph nodes appear unchanged. There is aortic and branch vessel atherosclerosis without acute vascular findings. Reproductive: Hysterectomy.  No adnexal mass. Other: No evidence of abdominal wall mass or hernia. No ascites. Musculoskeletal: Scattered sclerotic lesions within the L3 vertebral body left iliac bone and right  acetabulum are stable. No new or enlarging lesions are identified. No evidence of lytic lesion or pathologic fracture. IMPRESSION: 1. Progressive enlargement of sclerotic lesion posteriorly in the T5 vertebral body near the left pedicle suspicious for metastasis, likely a at least partially treated based on sclerosis. Additional scattered sclerotic lesions are unchanged. 2. Interval decreased size of right middle lobe nodule. Nodular density in the left upper lobe has not significantly changed. 3. No other evidence of metastatic disease identified in the chest, abdomen or pelvis. 4. Stable low-density lesions within the liver and kidneys, likely incidental cysts. Low-density lesion in the pancreatic tail is also unchanged and likely benign/indolent. Attention on follow-up recommended. Electronically Signed   By: Richardean Sale M.D.   On: 05/18/2021 09:12   CT Abdomen Pelvis W Contrast  Result Date: 05/18/2021 CLINICAL DATA:  Restaging non-small cell lung cancer. No current complaints. EXAM: CT CHEST, ABDOMEN, AND PELVIS WITH CONTRAST TECHNIQUE: Multidetector CT imaging of the chest, abdomen and pelvis was performed following the standard protocol during bolus administration of intravenous contrast. CONTRAST:  9m OMNIPAQUE IOHEXOL 350 MG/ML SOLN COMPARISON:  CT 02/12/2021 and 11/17/2020. PET-CT 05/31/2020. FINDINGS: CT CHEST FINDINGS Cardiovascular: No acute vascular findings. Mild atherosclerosis of the aorta, great vessels and coronary arteries again noted. There are calcifications of the aortic valve and mitral annulus. The heart size is normal. There is no pericardial effusion. Mediastinum/Nodes: There are no enlarged mediastinal, hilar or axillary lymph nodes. Small mediastinal and axillary lymph nodes are stable. The thyroid gland, trachea and esophagus appear stable without significant findings. Lungs/Pleura: No pleural effusion or pneumothorax. The right middle lobe nodule appears slightly smaller,  partially obscured by adjacent atelectasis or scarring. It currently measures approximately 6 x 6 mm on image 90/4 (previously 7 x 5 mm). Band like density medially in the left upper lobe appears unchanged with a nodular component measuring up to 1.7 x 1.3 cm on image 73/4. Scattered additional tiny pulmonary nodules are unchanged. No new or enlarging nodules are seen. Musculoskeletal/Chest wall: No chest wall mass. The sclerotic lesion centrally in the T5 vertebral body is stable. The other sclerotic lesion more posteriorly near the left pedicle has slightly enlarged, now measuring 9 mm on image 38/4, previously 7 mm maximally. No lytic lesion or pathologic fracture identified. CT ABDOMEN AND PELVIS FINDINGS Hepatobiliary: The liver is normal in density without suspicious focal abnormality. Scattered small hypodense lesions are stable, likely all cysts. No evidence of gallstones, gallbladder wall thickening or biliary dilatation. Pancreas: Stable 9 mm low-density lesion in the pancreatic tail on image 70/2. No pancreatic ductal dilatation or surrounding inflammatory change. Spleen: Normal in size without focal abnormality. Adrenals/Urinary Tract: The adrenal glands appear stable without suspicious findings. Bilateral renal cysts appear unchanged. No evidence of enhancing renal mass, urinary tract calculus or hydronephrosis. The  bladder appears unremarkable. Stomach/Bowel: Enteric contrast was administered and has passed into the distal small bowel. The stomach appears unremarkable for its degree of distension. No evidence of bowel wall thickening, distention or surrounding inflammatory change. Vascular/Lymphatic: There are no enlarged abdominal or pelvic lymph nodes. Small retroperitoneal and inguinal lymph nodes appear unchanged. There is aortic and branch vessel atherosclerosis without acute vascular findings. Reproductive: Hysterectomy.  No adnexal mass. Other: No evidence of abdominal wall mass or hernia. No  ascites. Musculoskeletal: Scattered sclerotic lesions within the L3 vertebral body left iliac bone and right acetabulum are stable. No new or enlarging lesions are identified. No evidence of lytic lesion or pathologic fracture. IMPRESSION: 1. Progressive enlargement of sclerotic lesion posteriorly in the T5 vertebral body near the left pedicle suspicious for metastasis, likely a at least partially treated based on sclerosis. Additional scattered sclerotic lesions are unchanged. 2. Interval decreased size of right middle lobe nodule. Nodular density in the left upper lobe has not significantly changed. 3. No other evidence of metastatic disease identified in the chest, abdomen or pelvis. 4. Stable low-density lesions within the liver and kidneys, likely incidental cysts. Low-density lesion in the pancreatic tail is also unchanged and likely benign/indolent. Attention on follow-up recommended. Electronically Signed   By: Richardean Sale M.D.   On: 05/18/2021 09:12     ASSESSMENT/PLAN:  This is a very pleasant 88 year old Caucasian female diagnosed with stage IV (T3, N3, M1 C) non-small cell lung cancer, adenocarcinoma.  She is positive for an EGFR mutation with deletion in exon 19.  She was diagnosed in December 2021.  She initially presented with a left lower lobe lung mass in addition to right supraclavicular, left hilar, and subcarinal lymphadenopathy.  She also has metastatic adenopathy in the abdomen and left pleural-based metastases.  She also has a malignant left pleural effusion and multiple brain metastases.  The patient is currently undergoing Tagrisso.  She started off with 80 mg p.o. daily on June 10, 2020.  She status post 9 months of treatment.  Starting from October 2022, her dose changed to 40 mg p.o. daily.  She is tolerating this well.  The patient recently had a restaging CT scan performed.  Dr. Julien Nordmann personally and independently reviewed the scan discussed results with the patient day.   The scan is stable.  The scan noted progressive enlargement of a sclerotic lesion in T5 vertebral body which is suspicious for metastasis but at least a partially treated based on the sclerosis.  Patient is not having any back pain at this time.  She will continue on the same treatment at the same dose.  We will see her back for follow-up visit in 6 weeks for evaluation and repeat blood work.  Will then arrange for restaging CT scan in 6 weeks from her next appointment.  The patient was advised to call immediately if she has any concerning symptoms in the interval. The patient voices understanding of current disease status and treatment options and is in agreement with the current care plan. All questions were answered. The patient knows to call the clinic with any problems, questions or concerns. We can certainly see the patient much sooner if necessary   Orders Placed This Encounter  Procedures   CBC with Differential ([facility] Only)    Standing Status:   Future    Standing Expiration Date:   05/23/2022   CMP ([facility] only)    Standing Status:   Future    Standing Expiration Date:  05/23/2022      Cassandra L Heilingoetter, PA-C 05/23/21  ADDENDUM: Hematology/Oncology Attending: I had a face-to-face encounter with the patient today.  I reviewed her record, lab, scan and recommended her care plan.  This is a very pleasant 88 years old white female with stage IV (T3, [place], M1 C) non-small cell lung cancer, adenocarcinoma with positive EGFR mutation with deletion in exon 19 diagnosed in December 2021. The patient is undergoing treatment with targeted therapy with Tagrisso 80 mg p.o. daily status post 1 year of treatment and has been tolerating her treatment well with no concerning adverse effects.  She denied having any significant skin rash but has occasional few episodes of diarrhea.  She continues to have dry skin and some nail changes and breaks in the skin of her  fingertips. She had repeat CT scan of the chest, abdomen pelvis performed recently.  I personally and independently reviewed the scan images and discussed the results with the patient today. Her scan showed no concerning findings for disease progression except for progressive enlargement of sclerotic osseous metastatic lesion of the T5 vertebral body close to the left pedicle.  The patient is asymptomatic in that area. I recommended for her to continue her current treatment with Tagrisso and we will continue to monitor this area closely and consider her for palliative radiotherapy if it continues to increase in size or the patient becomes symptomatic. She will come back for follow-up visit in 6 weeks for evaluation and repeat blood work. She was advised to call immediately if she has any other concerning symptoms in the interval.  The total time spent in the appointment was 30 minutes.

## 2021-05-23 NOTE — Progress Notes (Signed)
[place] [phone]   [phone]  OFFICE PROGRESS NOTE  [name], MD [place] [address]  DIAGNOSIS:  Stage IV (T2b, N2, M1c), non-small cell lung cancer, adenocarcinoma.  The patient presented with a left upper lobe mass, right supra clavicular, left hilar and subcarinal lymphadenopathy as well as diffuse left-sided pleural disease with associated malignant left pleural effusion and 2 small lymph nodes near the SMA in addition to multiple brain metastasis diagnosed in December 2021.    Molecular studies by [facility] showed positive EGFR mutation with deletion in exon 19   PRIOR THERAPY: None   CURRENT THERAPY: Tagrisso 80 mg p.o. daily. First dose June 10, 2020.  Status post 9 months of treatment.  Starting October 2022 the patient will be treated with Tagrisso 40 mg p.o. daily because of the intolerance.   INTERVAL HISTORY: Shelley Flores 88 y.o. female returns to the clinic today for a follow-up visit.  The patient is feeling "great" today without any concerning complaints.  She actually notes that compared to when she was diagnosed approximately 1 year ago that this is the best she has felt since her diagnosis.  She reports particularly in the last 2 to 3 weeks she has been feeling very well.  She used to have issues with early satiety, decreased appetite, bloating, and erratic bowel habits and a lots of gas.  She reports that this is all better.  She gained 1 pound since her last appointment.  She is tolerating her dose reduced Tagrisso fairly well without any concerning adverse side effects.  She denies any fever, chills, night sweats, or unexplained weight loss.  She denies any chest pain, cough, or hemoptysis.  She reports that she can tell that her shortness of breath is significantly better from her diagnosis when she had a a malignant pleural effusion.  She still sometimes reports some left-sided rib  discomfort.  She denies any nausea or vomiting. Denies any headache or visual changes.  Denies any bone pain/back pain.  Denies any rashes or skin changes.  The patient recently had a restaging CT scan performed.  She is here today for evaluation to review her scan results.   MEDICAL HISTORY: Past Medical History:  Diagnosis Date   Bruit    Abdominal bruit - Abdominal aorta/renal duplex Doppler evaluation 12/07/03 -Mildly abnormal evaluation. 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 This is consistant w/median arcuate ligament compression syndrome. *Right & Left Kidney: Essentially equal in size, symmetrical in shape w/no significant abnormalities visualized. *Right & Left Renal Arteries: No significant abnormalities.   Cancer ([place])    Edema extremities    LE edema    H/O myocardial perfusion scan 02/20/00   To rule out ischemia - Negative adequate Bruce protocol exercise stress test with a deconditioned exercise response and normal static myocardial perfusion images with EF calculated by QGS of 77%. Represents a low risk study.   Hyperlipidemia    Hypertension    Osteoarthritis    Osteoporosis    Pleural effusion 04/2020   Valvular heart disease    Mild valvular heart disease by Echo in 2010, all mild and symptomatic, including concentric LVH, MR, TR, and AI with pulmonary artery pressure of 32. EF was normal.    ALLERGIES:  is allergic to irbesartan.  MEDICATIONS:  Current Outpatient Medications  Medication Sig Dispense Refill   acetaminophen (TYLENOL) 325 MG tablet Take 2 tablets (650  mg total) by mouth every 6 (six) hours as needed for mild pain (or Fever >/= 101). 20 tablet 0   amLODipine (NORVASC) 2.5 MG tablet TAKE 1 TABLET BY MOUTH EVERY DAY 90 tablet 1   aspirin 81 MG tablet Take 81 mg by mouth daily.     atorvastatin (LIPITOR) 20 MG tablet TAKE 1 TABLET BY MOUTH EVERYDAY AT BEDTIME 90 tablet 1   loperamide (IMODIUM) 2 MG capsule Take by mouth as needed for  diarrhea or loose stools. (Patient not taking: Reported on 04/18/2021)     metoprolol succinate (TOPROL-XL) 50 MG 24 hr tablet Take 50 mg by mouth daily. Take with or immediately following a meal.     Multiple Vitamin (MULTIVITAMIN) tablet Take 1 tablet by mouth daily.     osimertinib mesylate (TAGRISSO) 40 MG tablet Take 1 tablet (40 mg total) by mouth daily. 30 tablet 3   Polyethyl Glycol-Propyl Glycol (SYSTANE HYDRATION PF OP) Apply to eye.     spironolactone (ALDACTONE) 25 MG tablet Take 1 tablet (25 mg total) by mouth daily. 90 tablet 3   White Petrolatum-Mineral Oil ([place] PETROL-MINERAL OIL-LANOLIN) 0.1-0.1 % OINT Apply to eye.     zinc oxide 20 % ointment Apply 1 application topically as needed for irritation. apply to buttocks/peri, topical, As Needed, To buttocks after every incontinent episode and as needed for redness. May keep at bedside.     No current facility-administered medications for this visit.    SURGICAL HISTORY:  Past Surgical History:  Procedure Laterality Date   Abdominal aorta/Renal duplex Doppler Evaluation  12/07/03   For abdominal bruit. Mildly abnormal evaluation. (See bruit in medical history)   BREAST EXCISIONAL BIOPSY Left 1966   BREAST EXCISIONAL BIOPSY Left 1999   CATARACT EXTRACTION  2003   CHEST TUBE INSERTION N/A 07/04/2020   Procedure: REMOVAL PLEURAL DRAINAGE CATHETER;  Surgeon: Garner Nash, DO;  Location: [facility];  Service: Pulmonary;  Laterality: N/A;   COLONOSCOPY  10/22/2004   DEXA Bone Scan  06/23/2013   IR PERC PLEURAL DRAIN W/INDWELL CATH W/IMG GUIDE  05/10/2020   IR THORACENTESIS ASP PLEURAL SPACE W/IMG GUIDE  05/04/2020    REVIEW OF SYSTEMS:  Constitutional: positive for fatigue Eyes: negative Ears, nose, mouth, throat, and face: negative Respiratory: positive for dyspnea on exertion Cardiovascular: negative Gastrointestinal: positive for diarrhea Genitourinary:negative Integument/breast: negative Hematologic/lymphatic:  negative Musculoskeletal:negative Neurological: negative Behavioral/Psych: negative Endocrine: negative Allergic/Immunologic: negative   PHYSICAL EXAMINATION: General appearance: alert, cooperative, fatigued, and no distress Head: Normocephalic, without obvious abnormality, atraumatic Neck: no adenopathy, no JVD, supple, symmetrical, trachea midline, and thyroid not enlarged, symmetric, no tenderness/mass/nodules Lymph nodes: Cervical, supraclavicular, and axillary nodes normal. Resp: clear to auscultation bilaterally Back: symmetric, no curvature. ROM normal. No CVA tenderness. Cardio: regular rate and rhythm, S1, S2 normal, no murmur, click, rub or gallop GI: soft, non-tender; bowel sounds normal; no masses,  no organomegaly Extremities: extremities normal, atraumatic, no cyanosis or edema Neurologic: Alert and oriented X 3, normal strength and tone. Normal symmetric reflexes. Normal coordination and gait  ECOG PERFORMANCE STATUS: 1 - Symptomatic but completely ambulatory  Blood pressure (!) 143/59, pulse 74, temperature 97.6 F (36.4 C), temperature source Axillary, resp. rate 15, height _0  (1.626 m), weight 103 lb 11.2 oz (47 kg), SpO2 100 %.  LABORATORY DATA: Lab Results  Component Value Date   WBC 6.5 05/17/2021   HGB 11.4 (L) 05/17/2021   HCT 34.6 (L) 05/17/2021   MCV 93.8 05/17/2021   PLT  179 05/17/2021      Chemistry      Component Value Date/Time   NA 136 05/21/2021 0800   NA 137 05/25/2020 0000   K 4.9 05/21/2021 0800   CL 103 05/21/2021 0800   CO2 25 05/21/2021 0800   BUN 34 (H) 05/21/2021 0800   BUN 12 05/25/2020 0000   CREATININE 1.22 (H) 05/21/2021 0800   GLU 83 05/25/2020 0000      Component Value Date/Time   CALCIUM 10.6 (H) 05/21/2021 0800   ALKPHOS 50 05/17/2021 0856   AST 19 05/17/2021 0856   ALT 15 05/17/2021 0856   BILITOT 0.4 05/17/2021 0856       RADIOGRAPHIC STUDIES: CT Chest W Contrast  Result Date: 05/18/2021 CLINICAL DATA:   Restaging non-small cell lung cancer. No current complaints. EXAM: CT CHEST, ABDOMEN, AND PELVIS WITH CONTRAST TECHNIQUE: Multidetector CT imaging of the chest, abdomen and pelvis was performed following the standard protocol during bolus administration of intravenous contrast. CONTRAST:  83mL OMNIPAQUE IOHEXOL 350 MG/ML SOLN COMPARISON:  CT 02/12/2021 and 11/17/2020. PET-CT 05/31/2020. FINDINGS: CT CHEST FINDINGS Cardiovascular: No acute vascular findings. Mild atherosclerosis of the aorta, great vessels and coronary arteries again noted. There are calcifications of the aortic valve and mitral annulus. The heart size is normal. There is no pericardial effusion. Mediastinum/Nodes: There are no enlarged mediastinal, hilar or axillary lymph nodes. Small mediastinal and axillary lymph nodes are stable. The thyroid gland, trachea and esophagus appear stable without significant findings. Lungs/Pleura: No pleural effusion or pneumothorax. The right middle lobe nodule appears slightly smaller, partially obscured by adjacent atelectasis or scarring. It currently measures approximately 6 x 6 mm on image 90/4 (previously 7 x 5 mm). Band like density medially in the left upper lobe appears unchanged with a nodular component measuring up to 1.7 x 1.3 cm on image 73/4. Scattered additional tiny pulmonary nodules are unchanged. No new or enlarging nodules are seen. Musculoskeletal/Chest wall: No chest wall mass. The sclerotic lesion centrally in the T5 vertebral body is stable. The other sclerotic lesion more posteriorly near the left pedicle has slightly enlarged, now measuring 9 mm on image 38/4, previously 7 mm maximally. No lytic lesion or pathologic fracture identified. CT ABDOMEN AND PELVIS FINDINGS Hepatobiliary: The liver is normal in density without suspicious focal abnormality. Scattered small hypodense lesions are stable, likely all cysts. No evidence of gallstones, gallbladder wall thickening or biliary dilatation.  Pancreas: Stable 9 mm low-density lesion in the pancreatic tail on image 70/2. No pancreatic ductal dilatation or surrounding inflammatory change. Spleen: Normal in size without focal abnormality. Adrenals/Urinary Tract: The adrenal glands appear stable without suspicious findings. Bilateral renal cysts appear unchanged. No evidence of enhancing renal mass, urinary tract calculus or hydronephrosis. The bladder appears unremarkable. Stomach/Bowel: Enteric contrast was administered and has passed into the distal small bowel. The stomach appears unremarkable for its degree of distension. No evidence of bowel wall thickening, distention or surrounding inflammatory change. Vascular/Lymphatic: There are no enlarged abdominal or pelvic lymph nodes. Small retroperitoneal and inguinal lymph nodes appear unchanged. There is aortic and branch vessel atherosclerosis without acute vascular findings. Reproductive: Hysterectomy.  No adnexal mass. Other: No evidence of abdominal wall mass or hernia. No ascites. Musculoskeletal: Scattered sclerotic lesions within the L3 vertebral body left iliac bone and right acetabulum are stable. No new or enlarging lesions are identified. No evidence of lytic lesion or pathologic fracture. IMPRESSION: 1. Progressive enlargement of sclerotic lesion posteriorly in the T5 vertebral body near the  left pedicle suspicious for metastasis, likely a at least partially treated based on sclerosis. Additional scattered sclerotic lesions are unchanged. 2. Interval decreased size of right middle lobe nodule. Nodular density in the left upper lobe has not significantly changed. 3. No other evidence of metastatic disease identified in the chest, abdomen or pelvis. 4. Stable low-density lesions within the liver and kidneys, likely incidental cysts. Low-density lesion in the pancreatic tail is also unchanged and likely benign/indolent. Attention on follow-up recommended. Electronically Signed   By: Richardean Sale M.D.   On: 05/18/2021 09:12   CT Abdomen Pelvis W Contrast  Result Date: 05/18/2021 CLINICAL DATA:  Restaging non-small cell lung cancer. No current complaints. EXAM: CT CHEST, ABDOMEN, AND PELVIS WITH CONTRAST TECHNIQUE: Multidetector CT imaging of the chest, abdomen and pelvis was performed following the standard protocol during bolus administration of intravenous contrast. CONTRAST:  80m OMNIPAQUE IOHEXOL 350 MG/ML SOLN COMPARISON:  CT 02/12/2021 and 11/17/2020. PET-CT 05/31/2020. FINDINGS: CT CHEST FINDINGS Cardiovascular: No acute vascular findings. Mild atherosclerosis of the aorta, great vessels and coronary arteries again noted. There are calcifications of the aortic valve and mitral annulus. The heart size is normal. There is no pericardial effusion. Mediastinum/Nodes: There are no enlarged mediastinal, hilar or axillary lymph nodes. Small mediastinal and axillary lymph nodes are stable. The thyroid gland, trachea and esophagus appear stable without significant findings. Lungs/Pleura: No pleural effusion or pneumothorax. The right middle lobe nodule appears slightly smaller, partially obscured by adjacent atelectasis or scarring. It currently measures approximately 6 x 6 mm on image 90/4 (previously 7 x 5 mm). Band like density medially in the left upper lobe appears unchanged with a nodular component measuring up to 1.7 x 1.3 cm on image 73/4. Scattered additional tiny pulmonary nodules are unchanged. No new or enlarging nodules are seen. Musculoskeletal/Chest wall: No chest wall mass. The sclerotic lesion centrally in the T5 vertebral body is stable. The other sclerotic lesion more posteriorly near the left pedicle has slightly enlarged, now measuring 9 mm on image 38/4, previously 7 mm maximally. No lytic lesion or pathologic fracture identified. CT ABDOMEN AND PELVIS FINDINGS Hepatobiliary: The liver is normal in density without suspicious focal abnormality. Scattered small hypodense lesions  are stable, likely all cysts. No evidence of gallstones, gallbladder wall thickening or biliary dilatation. Pancreas: Stable 9 mm low-density lesion in the pancreatic tail on image 70/2. No pancreatic ductal dilatation or surrounding inflammatory change. Spleen: Normal in size without focal abnormality. Adrenals/Urinary Tract: The adrenal glands appear stable without suspicious findings. Bilateral renal cysts appear unchanged. No evidence of enhancing renal mass, urinary tract calculus or hydronephrosis. The bladder appears unremarkable. Stomach/Bowel: Enteric contrast was administered and has passed into the distal small bowel. The stomach appears unremarkable for its degree of distension. No evidence of bowel wall thickening, distention or surrounding inflammatory change. Vascular/Lymphatic: There are no enlarged abdominal or pelvic lymph nodes. Small retroperitoneal and inguinal lymph nodes appear unchanged. There is aortic and branch vessel atherosclerosis without acute vascular findings. Reproductive: Hysterectomy.  No adnexal mass. Other: No evidence of abdominal wall mass or hernia. No ascites. Musculoskeletal: Scattered sclerotic lesions within the L3 vertebral body left iliac bone and right acetabulum are stable. No new or enlarging lesions are identified. No evidence of lytic lesion or pathologic fracture. IMPRESSION: 1. Progressive enlargement of sclerotic lesion posteriorly in the T5 vertebral body near the left pedicle suspicious for metastasis, likely a at least partially treated based on sclerosis. Additional scattered sclerotic lesions are unchanged.  2. Interval decreased size of right middle lobe nodule. Nodular density in the left upper lobe has not significantly changed. 3. No other evidence of metastatic disease identified in the chest, abdomen or pelvis. 4. Stable low-density lesions within the liver and kidneys, likely incidental cysts. Low-density lesion in the pancreatic tail is also  unchanged and likely benign/indolent. Attention on follow-up recommended. Electronically Signed   By: Richardean Sale M.D.   On: 05/18/2021 09:12    ASSESSMENT AND PLAN: This is a very pleasant 88 year old Caucasian female diagnosed with stage IV (T3, N3, M1 C) non-small cell lung cancer, adenocarcinoma.  She is positive for an EGFR mutation with deletion in exon 19.  She was diagnosed in December 2021.  She initially presented with a left lower lobe lung mass in addition to right supraclavicular, left hilar, and subcarinal lymphadenopathy.  She also has metastatic adenopathy in the abdomen and left pleural-based metastases.  She also has a malignant left pleural effusion and multiple brain metastases.  The patient is currently undergoing Tagrisso.  She started off with 80 mg p.o. daily on June 10, 2020.  She status post 9 months of treatment.  Starting from October 2022, her dose changed to 40 mg p.o. daily.  She is tolerating this well.  The patient recently had a restaging CT scan performed.  Dr. Julien Nordmann personally and independently reviewed the scan discussed results with the patient day.  The scan is stable.  The scan noted progressive enlargement of a sclerotic lesion in T5 vertebral body which is suspicious for metastasis but at least a partially treated based on the sclerosis.  Patient is not having any back pain at this time.   She will continue on the same treatment at the same dose.  We will see her back for follow-up visit in 6 weeks for evaluation and repeat blood work.  Will then arrange for restaging CT scan in 6 weeks from her next appointment.   The patient was advised to call immediately if she has any concerning symptoms in the interval. The patient voices understanding of current disease status and treatment options and is in agreement with the current care plan. All questions were answered. The patient knows to call the clinic with any problems, questions or concerns. We can  certainly see the patient much sooner if necessary          Orders Placed This Encounter  Procedures   CBC with Differential ([facility] Only)      Standing Status:   Future      Standing Expiration Date:   05/23/2022   CMP ([facility] only)      Standing Status:   Future      Standing Expiration Date:   05/23/2022        Tobe Sos Heilingoetter, PA-C 05/23/21   ADDENDUM: Hematology/Oncology Attending: I had a face-to-face encounter with the patient today.  I reviewed her record, lab, scan and recommended her care plan.  This is a very pleasant 88 years old white female with stage IV (T3, [place], M1 C) non-small cell lung cancer, adenocarcinoma with positive EGFR mutation with deletion in exon 19 diagnosed in December 2021. The patient is undergoing treatment with targeted therapy with Tagrisso 80 mg p.o. daily status post 1 year of treatment and has been tolerating her treatment well with no concerning adverse effects.  She denied having any significant skin rash but has occasional few episodes of diarrhea.  She continues to have dry skin and  some nail changes and breaks in the skin of her fingertips. She had repeat CT scan of the chest, abdomen pelvis performed recently.  I personally and independently reviewed the scan images and discussed the results with the patient today. Her scan showed no concerning findings for disease progression except for progressive enlargement of sclerotic osseous metastatic lesion of the T5 vertebral body close to the left pedicle.  The patient is asymptomatic in that area. I recommended for her to continue her current treatment with Tagrisso and we will continue to monitor this area closely and consider her for palliative radiotherapy if it continues to increase in size or the patient becomes symptomatic. She will come back for follow-up visit in 6 weeks for evaluation and repeat blood work. She was advised to call immediately if she has any other  concerning symptoms in the interval.  The patient voices understanding of current disease status and treatment options and is in agreement with the current care plan.  All questions were answered. The patient knows to call the clinic with any problems, questions or concerns. We can certainly see the patient much sooner if necessary. The total time spent in the appointment was 30 minutes.  Disclaimer: This note was dictated with voice recognition software. Similar sounding words can inadvertently be transcribed and may not be corrected upon review.

## 2021-05-25 DIAGNOSIS — H04123 Dry eye syndrome of bilateral lacrimal glands: Secondary | ICD-10-CM | POA: Diagnosis not present

## 2021-05-25 DIAGNOSIS — H0100A Unspecified blepharitis right eye, upper and lower eyelids: Secondary | ICD-10-CM | POA: Diagnosis not present

## 2021-05-25 DIAGNOSIS — H0100B Unspecified blepharitis left eye, upper and lower eyelids: Secondary | ICD-10-CM | POA: Diagnosis not present

## 2021-05-26 ENCOUNTER — Other Ambulatory Visit: Payer: Self-pay | Admitting: Internal Medicine

## 2021-06-05 ENCOUNTER — Other Ambulatory Visit: Payer: Self-pay | Admitting: Internal Medicine

## 2021-06-05 ENCOUNTER — Other Ambulatory Visit (HOSPITAL_COMMUNITY): Payer: Self-pay

## 2021-06-05 DIAGNOSIS — C3492 Malignant neoplasm of unspecified part of left bronchus or lung: Secondary | ICD-10-CM

## 2021-06-05 MED ORDER — OSIMERTINIB MESYLATE 40 MG PO TABS
40.0000 mg | ORAL_TABLET | Freq: Every day | ORAL | 3 refills | Status: DC
  Filled 2021-06-12: qty 30, 30d supply, fill #0
  Filled 2021-07-03: qty 30, 30d supply, fill #1
  Filled 2021-07-31: qty 30, 30d supply, fill #2
  Filled 2021-09-03: qty 30, 30d supply, fill #3

## 2021-06-12 ENCOUNTER — Other Ambulatory Visit (HOSPITAL_COMMUNITY): Payer: Self-pay

## 2021-06-14 ENCOUNTER — Other Ambulatory Visit (HOSPITAL_COMMUNITY): Payer: Self-pay

## 2021-06-14 ENCOUNTER — Telehealth: Payer: Self-pay | Admitting: Internal Medicine

## 2021-06-14 NOTE — Telephone Encounter (Signed)
Scheduled per los, patient has been called and notified. 

## 2021-06-15 ENCOUNTER — Other Ambulatory Visit (HOSPITAL_COMMUNITY): Payer: Self-pay

## 2021-07-02 ENCOUNTER — Other Ambulatory Visit: Payer: Self-pay | Admitting: Internal Medicine

## 2021-07-03 ENCOUNTER — Other Ambulatory Visit (HOSPITAL_COMMUNITY): Payer: Self-pay

## 2021-07-04 ENCOUNTER — Inpatient Hospital Stay: Payer: Medicare PPO | Attending: Physician Assistant

## 2021-07-04 ENCOUNTER — Inpatient Hospital Stay: Payer: Medicare PPO | Admitting: Internal Medicine

## 2021-07-04 ENCOUNTER — Other Ambulatory Visit: Payer: Self-pay

## 2021-07-04 VITALS — BP 132/65 | HR 74 | Temp 97.8°F | Resp 17 | Ht 64.0 in | Wt 105.7 lb

## 2021-07-04 DIAGNOSIS — I10 Essential (primary) hypertension: Secondary | ICD-10-CM | POA: Insufficient documentation

## 2021-07-04 DIAGNOSIS — C3492 Malignant neoplasm of unspecified part of left bronchus or lung: Secondary | ICD-10-CM

## 2021-07-04 DIAGNOSIS — Z5111 Encounter for antineoplastic chemotherapy: Secondary | ICD-10-CM

## 2021-07-04 DIAGNOSIS — C3412 Malignant neoplasm of upper lobe, left bronchus or lung: Secondary | ICD-10-CM | POA: Insufficient documentation

## 2021-07-04 DIAGNOSIS — C7931 Secondary malignant neoplasm of brain: Secondary | ICD-10-CM | POA: Diagnosis not present

## 2021-07-04 DIAGNOSIS — R21 Rash and other nonspecific skin eruption: Secondary | ICD-10-CM | POA: Insufficient documentation

## 2021-07-04 DIAGNOSIS — C779 Secondary and unspecified malignant neoplasm of lymph node, unspecified: Secondary | ICD-10-CM | POA: Insufficient documentation

## 2021-07-04 DIAGNOSIS — E785 Hyperlipidemia, unspecified: Secondary | ICD-10-CM | POA: Insufficient documentation

## 2021-07-04 DIAGNOSIS — J910 Malignant pleural effusion: Secondary | ICD-10-CM | POA: Diagnosis not present

## 2021-07-04 DIAGNOSIS — H109 Unspecified conjunctivitis: Secondary | ICD-10-CM | POA: Diagnosis not present

## 2021-07-04 DIAGNOSIS — Z79899 Other long term (current) drug therapy: Secondary | ICD-10-CM | POA: Insufficient documentation

## 2021-07-04 DIAGNOSIS — C3490 Malignant neoplasm of unspecified part of unspecified bronchus or lung: Secondary | ICD-10-CM

## 2021-07-04 LAB — CBC WITH DIFFERENTIAL (CANCER CENTER ONLY)
Abs Immature Granulocytes: 0.01 10*3/uL (ref 0.00–0.07)
Basophils Absolute: 0 10*3/uL (ref 0.0–0.1)
Basophils Relative: 1 %
Eosinophils Absolute: 0.2 10*3/uL (ref 0.0–0.5)
Eosinophils Relative: 4 %
HCT: 36.2 % (ref 36.0–46.0)
Hemoglobin: 12.1 g/dL (ref 12.0–15.0)
Immature Granulocytes: 0 %
Lymphocytes Relative: 15 %
Lymphs Abs: 0.9 10*3/uL (ref 0.7–4.0)
MCH: 30.9 pg (ref 26.0–34.0)
MCHC: 33.4 g/dL (ref 30.0–36.0)
MCV: 92.6 fL (ref 80.0–100.0)
Monocytes Absolute: 0.6 10*3/uL (ref 0.1–1.0)
Monocytes Relative: 10 %
Neutro Abs: 4.3 10*3/uL (ref 1.7–7.7)
Neutrophils Relative %: 70 %
Platelet Count: 171 10*3/uL (ref 150–400)
RBC: 3.91 MIL/uL (ref 3.87–5.11)
RDW: 15.6 % — ABNORMAL HIGH (ref 11.5–15.5)
WBC Count: 6.1 10*3/uL (ref 4.0–10.5)
nRBC: 0 % (ref 0.0–0.2)

## 2021-07-04 LAB — CMP (CANCER CENTER ONLY)
ALT: 14 U/L (ref 0–44)
AST: 19 U/L (ref 15–41)
Albumin: 4.2 g/dL (ref 3.5–5.0)
Alkaline Phosphatase: 53 U/L (ref 38–126)
Anion gap: 3 — ABNORMAL LOW (ref 5–15)
BUN: 35 mg/dL — ABNORMAL HIGH (ref 8–23)
CO2: 30 mmol/L (ref 22–32)
Calcium: 10.7 mg/dL — ABNORMAL HIGH (ref 8.9–10.3)
Chloride: 102 mmol/L (ref 98–111)
Creatinine: 1.31 mg/dL — ABNORMAL HIGH (ref 0.44–1.00)
GFR, Estimated: 40 mL/min — ABNORMAL LOW (ref >60)
Glucose, Bld: 97 mg/dL (ref 70–99)
Potassium: 4.5 mmol/L (ref 3.5–5.1)
Sodium: 135 mmol/L (ref 135–145)
Total Bilirubin: 0.4 mg/dL (ref 0.3–1.2)
Total Protein: 6.7 g/dL (ref 6.5–8.1)

## 2021-07-04 NOTE — Progress Notes (Signed)
[place] [phone]   [phone]) [phone]  OFFICE PROGRESS NOTE  [name], MD [place] [address]  DIAGNOSIS: Stage IV (T2b, N2, M1c), non-small cell lung cancer, adenocarcinoma.  The patient presented with a left upper lobe mass, right supra clavicular, left hilar and subcarinal lymphadenopathy as well as diffuse left-sided pleural disease with associated malignant left pleural effusion and 2 small lymph nodes near the SMA in addition to multiple brain metastasis diagnosed in December 2021.   Molecular studies by [employer] showed positive EGFR mutation with deletion in exon 19  PRIOR THERAPY: None  CURRENT THERAPY: Tagrisso 80 mg p.o. daily. First dose June 10, 2020.  Status post 11 months of treatment.  Starting October 2022 the patient is treated with Tagrisso 40 mg p.o. daily because of the intolerance.  INTERVAL HISTORY: Shelley Flores 88 y.o. female returns to the clinic today for follow-up visit.  The patient is feeling fine today with no concerning complaints except for the rash and scab in her scalp.  She has been using head and shoulder shampoo but no improvement in her condition.  She denied having any chest pain, shortness of breath, cough or hemoptysis.  She denied having any fever or chills.  She has no nausea, vomiting, diarrhea or constipation.  She has no headache or visual changes.  She has no recent weight loss or night sweats.  The patient continues to tolerate her treatment with Tagrisso fairly well.  MEDICAL HISTORY: Past Medical History:  Diagnosis Date   Bruit    Abdominal bruit - Abdominal aorta/renal duplex Doppler evaluation 12/07/03 -Mildly abnormal evaluation. 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 This is consistant w/median arcuate ligament compression syndrome. *Right & Left Kidney: Essentially equal in size, symmetrical in shape w/no significant abnormalities visualized.  *Right & Left Renal Arteries: No significant abnormalities.   Cancer ([facility])    Edema extremities    LE edema    H/O myocardial perfusion scan 02/20/00   To rule out ischemia - Negative adequate Bruce protocol exercise stress test with a deconditioned exercise response and normal static myocardial perfusion images with EF calculated by QGS of 77%. Represents a low risk study.   Hyperlipidemia    Hypertension    Osteoarthritis    Osteoporosis    Pleural effusion 04/2020   Valvular heart disease    Mild valvular heart disease by Echo in 2010, all mild and symptomatic, including concentric LVH, MR, TR, and AI with pulmonary artery pressure of 32. EF was normal.    ALLERGIES:  is allergic to irbesartan.  MEDICATIONS:  Current Outpatient Medications  Medication Sig Dispense Refill   acetaminophen (TYLENOL) 325 MG tablet Take 2 tablets (650 mg total) by mouth every 6 (six) hours as needed for mild pain (or Fever >/= 101). 20 tablet 0   amLODipine (NORVASC) 2.5 MG tablet TAKE 1 TABLET BY MOUTH EVERY DAY 90 tablet 1   aspirin 81 MG tablet Take 81 mg by mouth daily.     atorvastatin (LIPITOR) 20 MG tablet TAKE 1 TABLET BY MOUTH EVERYDAY AT BEDTIME 90 tablet 1   loperamide (IMODIUM) 2 MG capsule Take by mouth as needed for diarrhea or loose stools. (Patient not taking: Reported on 04/18/2021)     metoprolol succinate (TOPROL-XL) 50 MG 24 hr tablet TAKE 1 TABLET BY MOUTH DAILY. TAKE WITH OR IMMEDIATELY FOLLOWING A MEAL. 90 tablet 3   Multiple Vitamin (MULTIVITAMIN)  tablet Take 1 tablet by mouth daily.     osimertinib mesylate (TAGRISSO) 40 MG tablet Take 1 tablet (40 mg total) by mouth daily. 30 tablet 3   Polyethyl Glycol-Propyl Glycol (SYSTANE HYDRATION PF OP) Apply to eye.     spironolactone (ALDACTONE) 25 MG tablet Take 1 tablet (25 mg total) by mouth daily. 90 tablet 3   White Petrolatum-Mineral Oil ([place] PETROL-MINERAL OIL-LANOLIN) 0.1-0.1 % OINT Apply to eye.     zinc oxide 20 % ointment Apply  1 application topically as needed for irritation. apply to buttocks/peri, topical, As Needed, To buttocks after every incontinent episode and as needed for redness. May keep at bedside.     No current facility-administered medications for this visit.    SURGICAL HISTORY:  Past Surgical History:  Procedure Laterality Date   Abdominal aorta/Renal duplex Doppler Evaluation  12/07/03   For abdominal bruit. Mildly abnormal evaluation. (See bruit in medical history)   BREAST EXCISIONAL BIOPSY Left 1966   BREAST EXCISIONAL BIOPSY Left 1999   CATARACT EXTRACTION  2003   CHEST TUBE INSERTION N/A 07/04/2020   Procedure: REMOVAL PLEURAL DRAINAGE CATHETER;  Surgeon: Garner Nash, DO;  Location: [facility];  Service: Pulmonary;  Laterality: N/A;   COLONOSCOPY  10/22/2004   DEXA Bone Scan  06/23/2013   IR PERC PLEURAL DRAIN W/INDWELL CATH W/IMG GUIDE  05/10/2020   IR THORACENTESIS ASP PLEURAL SPACE W/IMG GUIDE  05/04/2020    REVIEW OF SYSTEMS:  A comprehensive review of systems was negative except for: Integument/breast: positive for dryness and rash   PHYSICAL EXAMINATION: General appearance: alert, cooperative, and no distress Head: Normocephalic, without obvious abnormality, atraumatic Neck: no adenopathy, no JVD, supple, symmetrical, trachea midline, and thyroid not enlarged, symmetric, no tenderness/mass/nodules Lymph nodes: Cervical, supraclavicular, and axillary nodes normal. Resp: clear to auscultation bilaterally Back: symmetric, no curvature. ROM normal. No CVA tenderness. Cardio: regular rate and rhythm, S1, S2 normal, no murmur, click, rub or gallop GI: soft, non-tender; bowel sounds normal; no masses,  no organomegaly Extremities: extremities normal, atraumatic, no cyanosis or edema  ECOG PERFORMANCE STATUS: 1 - Symptomatic but completely ambulatory  Blood pressure 132/65, pulse 74, temperature 97.8 F (36.6 C), temperature source Tympanic, resp. rate 17, height _0  (1.626 m),  weight 105 lb 11.2 oz (47.9 kg), SpO2 100 %.  LABORATORY DATA: Lab Results  Component Value Date   WBC 6.1 07/04/2021   HGB 12.1 07/04/2021   HCT 36.2 07/04/2021   MCV 92.6 07/04/2021   PLT 171 07/04/2021      Chemistry      Component Value Date/Time   NA 136 05/21/2021 0800   NA 137 05/25/2020 0000   K 4.9 05/21/2021 0800   CL 103 05/21/2021 0800   CO2 25 05/21/2021 0800   BUN 34 (H) 05/21/2021 0800   BUN 12 05/25/2020 0000   CREATININE 1.22 (H) 05/21/2021 0800   GLU 83 05/25/2020 0000      Component Value Date/Time   CALCIUM 10.6 (H) 05/21/2021 0800   ALKPHOS 50 05/17/2021 0856   AST 19 05/17/2021 0856   ALT 15 05/17/2021 0856   BILITOT 0.4 05/17/2021 0856       RADIOGRAPHIC STUDIES: No results found.   ASSESSMENT AND PLAN: This is a very pleasant 88 years old white female recently diagnosed with a stage IV (T3, N3, M1 C) non-small cell lung cancer, adenocarcinoma with positive EGFR mutation with deletion in exon 19 diagnosed in December 2021 and presented with left  lower lobe lung mass in addition to right supraclavicular, left hilar and subcarinal lymphadenopathy as well as metastatic lymphadenopathy in the abdomen and left pleural-based metastasis as well as malignant left pleural effusion and multiple brain metastasis. The molecular study showed positive EGFR mutation with deletion in exon 19. The patient started treatment with Tagrisso 80 mg p.o. daily on June 10, 2020.  Status post 9 months of treatment.  Starting from October 2022 her dose has changed to 40 mg p.o. daily.   She continues to tolerate her treatment well with no concerning adverse effects except for the conjunctivitis that has significantly improved with the over-the-counter lubricant ointment as well as Systane. I recommended for her to continue her current treatment with Tagrisso 40 mg p.o. daily. The patient continues to tolerate her treatment well with no concerning adverse effect except for  the rash and scabs in the scalp.  I advised her to change her shampoo to [facility] and also to check with dermatology for any other suggestion. Her lab work today is unremarkable and she has improvement in her hemoglobin and hematocrit. I will see her back for follow-up visit in 6 weeks for evaluation with repeat CT scan of the chest, abdomen and pelvis for restaging of her disease. The patient was advised to call immediately if she has any other concerning symptoms in the interval. The patient voices understanding of current disease status and treatment options and is in agreement with the current care plan.  All questions were answered. The patient knows to call the clinic with any problems, questions or concerns. We can certainly see the patient much sooner if necessary.   Disclaimer: This note was dictated with voice recognition software. Similar sounding words can inadvertently be transcribed and may not be corrected upon review.

## 2021-07-05 ENCOUNTER — Other Ambulatory Visit: Payer: Self-pay

## 2021-07-05 ENCOUNTER — Telehealth: Payer: Self-pay | Admitting: Internal Medicine

## 2021-07-05 MED ORDER — SPIRONOLACTONE 25 MG PO TABS: 25.0000 mg | ORAL_TABLET | Freq: Every day | ORAL | 3 refills | Status: DC

## 2021-07-05 NOTE — Telephone Encounter (Signed)
°*  STAT* If patient is at the pharmacy, call can be transferred to refill team.   1. Which medications need to be refilled? (please list name of each medication and dose if known) new prescription for Spironolactone  2. Which pharmacy/location (including street and city if local pharmacy) is medication to be sent to? [facility] [place] [place]  3. Do they need a 30 day or 90 day supply? 90 days and refills- need this asap

## 2021-07-05 NOTE — Telephone Encounter (Signed)
Called patient to advise refill sent to pharmacy.

## 2021-07-12 ENCOUNTER — Other Ambulatory Visit (HOSPITAL_COMMUNITY): Payer: Self-pay

## 2021-07-12 DIAGNOSIS — L218 Other seborrheic dermatitis: Secondary | ICD-10-CM | POA: Diagnosis not present

## 2021-07-12 DIAGNOSIS — S81809D Unspecified open wound, unspecified lower leg, subsequent encounter: Secondary | ICD-10-CM | POA: Diagnosis not present

## 2021-07-27 ENCOUNTER — Other Ambulatory Visit (HOSPITAL_COMMUNITY): Payer: Self-pay

## 2021-07-31 ENCOUNTER — Other Ambulatory Visit (HOSPITAL_COMMUNITY): Payer: Self-pay

## 2021-08-08 ENCOUNTER — Other Ambulatory Visit (HOSPITAL_COMMUNITY): Payer: Self-pay

## 2021-08-13 ENCOUNTER — Ambulatory Visit (HOSPITAL_COMMUNITY)
Admission: RE | Admit: 2021-08-13 | Discharge: 2021-08-13 | Disposition: A | Discharge status: Home / Self Care | Payer: Medicare PPO | Source: Ambulatory Visit | Attending: Internal Medicine | Admitting: Internal Medicine

## 2021-08-13 ENCOUNTER — Other Ambulatory Visit: Payer: Self-pay

## 2021-08-13 ENCOUNTER — Inpatient Hospital Stay: Payer: Medicare PPO | Attending: Physician Assistant

## 2021-08-13 DIAGNOSIS — C3490 Malignant neoplasm of unspecified part of unspecified bronchus or lung: Secondary | ICD-10-CM

## 2021-08-13 DIAGNOSIS — C779 Secondary and unspecified malignant neoplasm of lymph node, unspecified: Secondary | ICD-10-CM | POA: Insufficient documentation

## 2021-08-13 DIAGNOSIS — Z79899 Other long term (current) drug therapy: Secondary | ICD-10-CM | POA: Insufficient documentation

## 2021-08-13 DIAGNOSIS — C3412 Malignant neoplasm of upper lobe, left bronchus or lung: Secondary | ICD-10-CM | POA: Insufficient documentation

## 2021-08-13 DIAGNOSIS — E785 Hyperlipidemia, unspecified: Secondary | ICD-10-CM | POA: Insufficient documentation

## 2021-08-13 DIAGNOSIS — K5730 Diverticulosis of large intestine without perforation or abscess without bleeding: Secondary | ICD-10-CM | POA: Diagnosis not present

## 2021-08-13 DIAGNOSIS — R21 Rash and other nonspecific skin eruption: Secondary | ICD-10-CM | POA: Insufficient documentation

## 2021-08-13 DIAGNOSIS — R918 Other nonspecific abnormal finding of lung field: Secondary | ICD-10-CM | POA: Diagnosis not present

## 2021-08-13 DIAGNOSIS — I10 Essential (primary) hypertension: Secondary | ICD-10-CM | POA: Insufficient documentation

## 2021-08-13 DIAGNOSIS — N281 Cyst of kidney, acquired: Secondary | ICD-10-CM | POA: Diagnosis not present

## 2021-08-13 DIAGNOSIS — K7689 Other specified diseases of liver: Secondary | ICD-10-CM | POA: Diagnosis not present

## 2021-08-13 DIAGNOSIS — C7931 Secondary malignant neoplasm of brain: Secondary | ICD-10-CM | POA: Insufficient documentation

## 2021-08-13 DIAGNOSIS — J984 Other disorders of lung: Secondary | ICD-10-CM | POA: Diagnosis not present

## 2021-08-13 LAB — CBC WITH DIFFERENTIAL (CANCER CENTER ONLY)
Abs Immature Granulocytes: 0.02 10*3/uL (ref 0.00–0.07)
Basophils Absolute: 0 10*3/uL (ref 0.0–0.1)
Basophils Relative: 1 %
Eosinophils Absolute: 0.2 10*3/uL (ref 0.0–0.5)
Eosinophils Relative: 3 %
HCT: 37.4 % (ref 36.0–46.0)
Hemoglobin: 12 g/dL (ref 12.0–15.0)
Immature Granulocytes: 0 %
Lymphocytes Relative: 12 %
Lymphs Abs: 0.8 10*3/uL (ref 0.7–4.0)
MCH: 30.2 pg (ref 26.0–34.0)
MCHC: 32.1 g/dL (ref 30.0–36.0)
MCV: 94.2 fL (ref 80.0–100.0)
Monocytes Absolute: 0.5 10*3/uL (ref 0.1–1.0)
Monocytes Relative: 8 %
Neutro Abs: 5 10*3/uL (ref 1.7–7.7)
Neutrophils Relative %: 76 %
Platelet Count: 187 10*3/uL (ref 150–400)
RBC: 3.97 MIL/uL (ref 3.87–5.11)
RDW: 15.1 % (ref 11.5–15.5)
WBC Count: 6.5 10*3/uL (ref 4.0–10.5)
nRBC: 0 % (ref 0.0–0.2)

## 2021-08-13 LAB — CMP (CANCER CENTER ONLY)
ALT: 17 U/L (ref 0–44)
AST: 20 U/L (ref 15–41)
Albumin: 4.1 g/dL (ref 3.5–5.0)
Alkaline Phosphatase: 53 U/L (ref 38–126)
Anion gap: 4 — ABNORMAL LOW (ref 5–15)
BUN: 30 mg/dL — ABNORMAL HIGH (ref 8–23)
CO2: 29 mmol/L (ref 22–32)
Calcium: 10.8 mg/dL — ABNORMAL HIGH (ref 8.9–10.3)
Chloride: 103 mmol/L (ref 98–111)
Creatinine: 1.24 mg/dL — ABNORMAL HIGH (ref 0.44–1.00)
GFR, Estimated: 42 mL/min — ABNORMAL LOW (ref >60)
Glucose, Bld: 117 mg/dL — ABNORMAL HIGH (ref 70–99)
Potassium: 4.9 mmol/L (ref 3.5–5.1)
Sodium: 136 mmol/L (ref 135–145)
Total Bilirubin: 0.4 mg/dL (ref 0.3–1.2)
Total Protein: 6.8 g/dL (ref 6.5–8.1)

## 2021-08-13 MED ORDER — SODIUM CHLORIDE (PF) 0.9 % IJ SOLN
INTRAMUSCULAR | Status: AC
  Filled 2021-08-13: qty 50

## 2021-08-13 MED ORDER — IOHEXOL 300 MG/ML  SOLN
80.0000 mL | Freq: Once | INTRAMUSCULAR | Status: AC | PRN
  Administered 2021-08-13: 80 mL via INTRAVENOUS

## 2021-08-16 ENCOUNTER — Other Ambulatory Visit: Payer: Self-pay

## 2021-08-16 ENCOUNTER — Inpatient Hospital Stay: Payer: Medicare PPO | Admitting: Internal Medicine

## 2021-08-16 VITALS — BP 142/54 | HR 80 | Temp 98.6°F | Resp 17 | Wt 107.6 lb

## 2021-08-16 DIAGNOSIS — Z5111 Encounter for antineoplastic chemotherapy: Secondary | ICD-10-CM

## 2021-08-16 DIAGNOSIS — C779 Secondary and unspecified malignant neoplasm of lymph node, unspecified: Secondary | ICD-10-CM | POA: Diagnosis not present

## 2021-08-16 DIAGNOSIS — R21 Rash and other nonspecific skin eruption: Secondary | ICD-10-CM | POA: Diagnosis not present

## 2021-08-16 DIAGNOSIS — C7931 Secondary malignant neoplasm of brain: Secondary | ICD-10-CM | POA: Diagnosis not present

## 2021-08-16 DIAGNOSIS — E785 Hyperlipidemia, unspecified: Secondary | ICD-10-CM | POA: Diagnosis not present

## 2021-08-16 DIAGNOSIS — Z79899 Other long term (current) drug therapy: Secondary | ICD-10-CM | POA: Diagnosis not present

## 2021-08-16 DIAGNOSIS — C3432 Malignant neoplasm of lower lobe, left bronchus or lung: Secondary | ICD-10-CM

## 2021-08-16 DIAGNOSIS — C3492 Malignant neoplasm of unspecified part of left bronchus or lung: Secondary | ICD-10-CM | POA: Diagnosis not present

## 2021-08-16 DIAGNOSIS — I10 Essential (primary) hypertension: Secondary | ICD-10-CM | POA: Diagnosis not present

## 2021-08-16 DIAGNOSIS — C3412 Malignant neoplasm of upper lobe, left bronchus or lung: Secondary | ICD-10-CM | POA: Diagnosis not present

## 2021-08-16 NOTE — Progress Notes (Signed)
?    [facility] ?Telephone:([phone]   [phone] ? ?OFFICE PROGRESS NOTE ? ?Shelley Dad, MD ?[place] ?[place] [address] ? ?DIAGNOSIS: Stage IV (T2b, N2, M1c), non-small cell lung cancer, adenocarcinoma.  The patient presented with a left upper lobe mass, right supra clavicular, left hilar and subcarinal lymphadenopathy as well as diffuse left-sided pleural disease with associated malignant left pleural effusion and 2 small lymph nodes near the SMA in addition to multiple brain metastasis diagnosed in December 2021.  ? ?Molecular studies by [employer] showed positive EGFR mutation with deletion in exon 19 ? ?PRIOR THERAPY: None ? ?CURRENT THERAPY: Tagrisso 80 mg p.o. daily. First dose June 10, 2020.  Status post 13 months of treatment.  Starting October 2022 the patient is treated with Tagrisso 40 mg p.o. daily because of the intolerance. ? ?INTERVAL HISTORY: ?Shelley Flores 88 y.o. female returns to the clinic today for follow-up visit.  The patient is feeling fine today with no concerning complaints except for the dry skin and scalp with some had dandruff.  She has been using different shampoos and lotions recently including head and shoulder as well as [place] Blue with mild improvement.  She is also planning to start using ketoconazole shampoo.  She has few episodes of diarrhea.  She has occasional left-sided chest pain but no shortness of breath, cough or hemoptysis.  She denied having any recent weight loss or night sweats.  She has no headache or visual changes.  She had repeat CT scan of the chest, abdomen pelvis performed recently and she is here for evaluation and discussion of her scan results. ? ?MEDICAL HISTORY: ?Past Medical History:  ?Diagnosis Date  ? Bruit   ? Abdominal bruit - Abdominal aorta/renal duplex Doppler evaluation 12/07/03 -Mildly abnormal evaluation. 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 This is consistant w/median  arcuate ligament compression syndrome. *Right & Left Kidney: Essentially equal in size, symmetrical in shape w/no significant abnormalities visualized. *Right & Left Renal Arteries: No significant abnormalities.  ? Cancer ([place])   ? Edema extremities   ? LE edema   ? H/O myocardial perfusion scan 02/20/00  ? To rule out ischemia - Negative adequate Bruce protocol exercise stress test with a deconditioned exercise response and normal static myocardial perfusion images with EF calculated by QGS of 77%. Represents a low risk study.  ? Hyperlipidemia   ? Hypertension   ? Osteoarthritis   ? Osteoporosis   ? Pleural effusion 04/2020  ? Valvular heart disease   ? 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 EF was normal.  ? ? ?ALLERGIES:  is allergic to irbesartan. ? ?MEDICATIONS:  ?Current Outpatient Medications  ?Medication Sig Dispense Refill  ? acetaminophen (TYLENOL) 325 MG tablet Take 2 tablets (650 mg total) by mouth every 6 (six) hours as needed for mild pain (or Fever >/= 101). 20 tablet 0  ? amLODipine (NORVASC) 2.5 MG tablet TAKE 1 TABLET BY MOUTH EVERY DAY 90 tablet 1  ? aspirin 81 MG tablet Take 81 mg by mouth daily.    ? atorvastatin (LIPITOR) 20 MG tablet TAKE 1 TABLET BY MOUTH EVERYDAY AT BEDTIME 90 tablet 1  ? loperamide (IMODIUM) 2 MG capsule Take by mouth as needed for diarrhea or loose stools. (Patient not taking: Reported on 04/18/2021)    ? metoprolol succinate (TOPROL-XL) 50  MG 24 hr tablet TAKE 1 TABLET BY MOUTH DAILY. TAKE WITH OR IMMEDIATELY FOLLOWING A MEAL. 90 tablet 3  ? Multiple Vitamin (MULTIVITAMIN) tablet Take 1 tablet by mouth daily.    ? osimertinib mesylate (TAGRISSO) 40 MG tablet Take 1 tablet (40 mg total) by mouth daily. 30 tablet 3  ? Polyethyl Glycol-Propyl Glycol (SYSTANE HYDRATION PF OP) Apply to eye.    ? spironolactone (ALDACTONE) 25 MG tablet Take 1 tablet (25 mg total) by  mouth daily. 90 tablet 3  ? White Petrolatum-Mineral Oil ([place] PETROL-MINERAL OIL-LANOLIN) 0.1-0.1 % OINT Apply to eye.    ? ?No current facility-administered medications for this visit.  ? ? ?SURGICAL HISTORY:  ?Past Surgical History:  ?Procedure Laterality Date  ? Abdominal aorta/Renal duplex Doppler Evaluation  12/07/03  ? For abdominal bruit. Mildly abnormal evaluation. (See bruit in medical history)  ? BREAST EXCISIONAL BIOPSY Left 1966  ? BREAST EXCISIONAL BIOPSY Left 1999  ? CATARACT EXTRACTION  2003  ? CHEST TUBE INSERTION N/A 07/04/2020  ? Procedure: REMOVAL PLEURAL DRAINAGE CATHETER;  Surgeon: Garner Nash, DO;  Location: [facility];  Service: Pulmonary;  Laterality: N/A;  ? COLONOSCOPY  10/22/2004  ? DEXA Bone Scan  06/23/2013  ? IR PERC PLEURAL DRAIN W/INDWELL CATH W/IMG GUIDE  05/10/2020  ? IR THORACENTESIS ASP PLEURAL SPACE W/IMG GUIDE  05/04/2020  ? ? ?REVIEW OF SYSTEMS:  Constitutional: negative ?Eyes: negative ?Ears, nose, mouth, throat, and face: negative ?Respiratory: positive for pleurisy/chest pain ?Cardiovascular: negative ?Gastrointestinal: negative ?Genitourinary:negative ?Integument/breast: positive for dryness and rash ?Hematologic/lymphatic: negative ?Musculoskeletal:negative ?Neurological: negative ?Behavioral/Psych: negative ?Endocrine: negative ?Allergic/Immunologic: negative  ? ?PHYSICAL EXAMINATION: General appearance: alert, cooperative, and no distress ?Head: Normocephalic, without obvious abnormality, atraumatic ?Neck: no adenopathy, no JVD, supple, symmetrical, trachea midline, and thyroid not enlarged, symmetric, no tenderness/mass/nodules ?Lymph nodes: Cervical, supraclavicular, and axillary nodes normal. ?Resp: clear to auscultation bilaterally ?Back: symmetric, no curvature. ROM normal. No CVA tenderness. ?Cardio: regular rate and rhythm, S1, S2 normal, no murmur, click, rub or gallop ?GI: soft, non-tender; bowel sounds normal; no masses,  no organomegaly ?Extremities:  extremities normal, atraumatic, no cyanosis or edema ?Neurologic: Alert and oriented X 3, normal strength and tone. Normal symmetric reflexes. Normal coordination and gait ? ?ECOG PERFORMANCE STATUS: 1 - Symptomatic but completely ambulatory ? ?Blood pressure (!) 142/54, pulse 80, temperature 98.6 ?F (37 ?C), temperature source Tympanic, resp. rate 17, weight 107 lb 9 oz (48.8 kg), SpO2 100 %. ? ?LABORATORY DATA: ?Lab Results  ?Component Value Date  ? WBC 6.5 08/13/2021  ? HGB 12.0 08/13/2021  ? HCT 37.4 08/13/2021  ? MCV 94.2 08/13/2021  ? PLT 187 08/13/2021  ? ? ?  Chemistry   ?   ?Component Value Date/Time  ? NA 136 08/13/2021 1126  ? NA 137 05/25/2020 0000  ? K 4.9 08/13/2021 1126  ? CL 103 08/13/2021 1126  ? CO2 29 08/13/2021 1126  ? BUN 30 (H) 08/13/2021 1126  ? BUN 12 05/25/2020 0000  ? CREATININE 1.24 (H) 08/13/2021 1126  ? CREATININE 1.22 (H) 05/21/2021 0800  ? GLU 83 05/25/2020 0000  ?    ?Component Value Date/Time  ? CALCIUM 10.8 (H) 08/13/2021 1126  ? ALKPHOS 53 08/13/2021 1126  ? AST 20 08/13/2021 1126  ? ALT 17 08/13/2021 1126  ? BILITOT 0.4 08/13/2021 1126  ?  ? ? ? ?RADIOGRAPHIC STUDIES: ?CT Chest W Contrast ? ?Result Date: 08/14/2021 ?CLINICAL DATA:  Restaging non-small cell lung cancer. * Tracking Code: BO *  EXAM: CT CHEST, ABDOMEN, AND PELVIS WITH CONTRAST TECHNIQUE: Multidetector CT imaging of the chest, abdomen and pelvis was performed following the standard protocol during bolus administration of intravenous contrast. RADIATION DOSE REDUCTION: This exam was performed according to the departmental dose-optimization program which includes automated exposure control, adjustment of the mA and/or kV according to patient size and/or use of iterative reconstruction technique. CONTRAST:  32m OMNIPAQUE IOHEXOL 300 MG/ML  SOLN COMPARISON:  Multiple priors including most recent CT May 17, 2021 FINDINGS: CT CHEST FINDINGS Cardiovascular: Aortic atherosclerosis without thoracic aortic aneurysm. No central  pulmonary embolus on this nondedicated study. Calcifications of the aortic valve and mitral annulus. Normal size heart. No significant pericardial effusion/thickening. Mediastinum/Nodes: No supraclavicular adenop

## 2021-08-23 ENCOUNTER — Encounter: Payer: Self-pay | Admitting: Internal Medicine

## 2021-08-27 ENCOUNTER — Encounter: Payer: Self-pay | Admitting: Internal Medicine

## 2021-08-27 ENCOUNTER — Ambulatory Visit: Payer: Medicare PPO | Admitting: Internal Medicine

## 2021-08-27 VITALS — BP 132/62 | HR 70 | Ht 64.0 in | Wt 106.0 lb

## 2021-08-27 DIAGNOSIS — E785 Hyperlipidemia, unspecified: Secondary | ICD-10-CM | POA: Diagnosis not present

## 2021-08-27 DIAGNOSIS — I10 Essential (primary) hypertension: Secondary | ICD-10-CM | POA: Diagnosis not present

## 2021-08-27 NOTE — Patient Instructions (Signed)

## 2021-08-27 NOTE — Progress Notes (Signed)
? ?OFFICE NOTE ? ?Chief Complaint:  ?Follow-up ? ?Primary Care Physician: ?[name], MD ? ?HPI:  ?Shelley Flores  Is a 88 year old female who comes in for followup. She has hypertension and hyperlipidemia and had had an echocardiogram in 2010 that showed mild concentric LVH, mild AI, mild MR/TR with pulmonary artery pressure of 32. Her EF was normal. She has no specific cardiac complaints. Blood pressure and cholesterol have been well-controlled. She is due for recheck of her lipid profile. Unfortunately she does not get much exercise but she is looking into it and possibly considering the [employer] program. ? ?I saw [name] back in the office today. Overall she is doing well except for some leg swelling at the end of the day. Denies any chest pain or worsening shortness of breath. EKG looks unchanged. ? ?02/26/2016 ? ?[name] returns today for follow-up. Over the past year she is done fairly well. She denies any chest pain or worsening shortness of breath. Initially blood pressure was elevated 158/78 however recheck came down to 130/70. She is on low-dose aspirin. She is due for recheck of her lipid profile. She has not had any worsening palpitations.  ? ?03/11/2017 ? ?[name] returns today for follow-up.  She is done well over the past year.  She is since moved into [facility] [place].  She is very happy there.  This is an assisted living facility.  She denies any chest pain or worsening shortness of breath.  Blood pressure is improved today.  She gets annual lipid profiles and is due for that as well.  She denies any problems with her medications.  She says she is sleeping well.  The only issue is she had some imbalance however she has been working with physical therapy on that. ? ?03/18/2018 ? ?[name] is seen today in routine follow-up.  Overall she is doing well without new complaints.  She continues to live at [facility] [place].  She says she is not as active as she like to be.   Weight has fluctuated some.  She denies any chest pain or shortness of breath.  EKG shows sinus rhythm.  Blood pressure is well controlled today. ? ?03/24/2019 ? ?[name] is seen today in follow-up.  She denies any new chest pain or worsening shortness of breath.  Her most recent lipid profile showed total cholesterol 168, triglycerides 102, HDL 53 and LDL 95.  This is actually at target with goal LDL less than 100.  The study however was almost a year ago.  She remains on a atorvastatin 20 mg.  Blood pressure is significantly elevated today at 179/84.  She brought a list of blood pressure readings that were taken at [facility] indicating her blood pressure was 120/80 generally by manual cuff.  Her personal automatic cuff however shows a blood pressure to be about 397-673 systolic.  She also is complaining of some lower extremity edema, particularly pedal edema which is worsened recently and does not necessarily go away with elevating her feet at night. ? ?03/24/2020 ? ?Shelley Flores returns today for follow-up.  She continues to struggle with lower extremity edema and I think suboptimal blood pressure control.  I had previously put her on some thiazide diuretic however she does not seem like she has had significant improvement with that.  Pressure here today was 152/84.  She says at home she gets around 419 systolic.  At her clinic at [facility] they get around 120 but she doubts  that number. ? ?06/15/2020 ? ?Shelley Flores is seen today for follow-up.  Unfortunately she was recently hospitalized for a pleural effusion.  Subsequently after thoracentesis she was found to have pulmonary nodule with evidence for metastases.  She was diagnosed with adenocarcinoma.  There also appears to be some extension into the brain.  She is seeing Dr. Earlie Server for this.  She could not tolerate irbesartan but it may have been side effects related to this.  Nonetheless she is not been on any blood pressure medications and is hypertensive  today.  He continues to have issues with swelling is not responding to Lasix. ? ?08/08/2020 ? ?Shelley Flores seen today for follow-up of hypertension.  After restarting her amlodipine blood pressure is much better controlled.  Today she is 127/66.  She did ask me to listen to her noted that she does have some dullness to percussion at the left base which may represent some persistent pleural effusion.  She does have follow-up with a CT scan in April 1 and sees the pulmonologist on April 4. ? ?01/23/2021 ? ?Shelley Flores returns today for follow-up.  Blood pressure was good today.  She is concerned that her home blood pressure readings are low but actually systolic is not necessarily below 100.  She is on low-dose amlodipine.  Her cholesterol appears to be well controlled as of December with total 139, HDL 53, LDL 68 and triglycerides 28.  EKG today shows a normal sinus rhythm.  She reports that her tumors are shrinking somewhat.  She does report overall fatigue.  She was noted to have coronary calcification and atherosclerosis by CT but had not had previous reports of this. ? ?08/27/2021 ? ?[name] returns today for follow-up.  She has had recently fairly stable findings on her CT of the for follow-up of cancer.  She denies any worsening shortness of breath or chest pain.  Blood pressures been well controlled.  She has recently gained back a little weight as she was underweight.  EKG shows a normal sinus rhythm.  Cholesterol has been well controlled with LDL of 70 in January. ? ?PMHx:  ?Past Medical History:  ?Diagnosis Date  ? Bruit   ? Abdominal bruit - Abdominal aorta/renal duplex Doppler evaluation 12/07/03 -Mildly abnormal evaluation. 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 This is consistant w/median arcuate ligament compression syndrome. *Right & Left Kidney: Essentially equal in size, symmetrical in shape w/no significant abnormalities visualized. *Right & Left Renal Arteries: No significant  abnormalities.  ? Cancer ([place])   ? Edema extremities   ? LE edema   ? H/O myocardial perfusion scan 02/20/00  ? To rule out ischemia - Negative adequate Bruce protocol exercise stress test with a deconditioned exercise response and normal static myocardial perfusion images with EF calculated by QGS of 77%. Represents a low risk study.  ? Hyperlipidemia   ? Hypertension   ? Osteoarthritis   ? Osteoporosis   ? Pleural effusion 04/2020  ? Valvular heart disease   ? 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 EF was normal.  ? ? ?Past Surgical History:  ?Procedure Laterality Date  ? Abdominal aorta/Renal duplex Doppler Evaluation  12/07/03  ? For abdominal bruit. Mildly abnormal evaluation. (See bruit in medical history)  ? BREAST EXCISIONAL BIOPSY Left 1966  ? BREAST EXCISIONAL BIOPSY Left 1999  ? CATARACT EXTRACTION  2003  ? CHEST TUBE INSERTION  N/A 07/04/2020  ? Procedure: REMOVAL PLEURAL DRAINAGE CATHETER;  Surgeon: Garner Nash, DO;  Location: [facility];  Service: Pulmonary;  Laterality: N/A;  ? COLONOSCOPY  10/22/2004  ? DEXA Bone Scan  06/23/2013  ? IR PERC PLEURAL DRAIN W/INDWELL CATH W/IMG GUIDE  05/10/2020  ? IR THORACENTESIS ASP PLEURAL SPACE W/IMG GUIDE  05/04/2020  ? ? ?FAMHx:  ?Family History  ?Problem Relation Age of Onset  ? Leukemia Mother 24  ? Cancer Father   ?     esophagial cancer  ? Heart failure Maternal Grandmother   ? Tuberculosis Maternal Grandfather   ? Heart disease Paternal Grandmother   ? Cancer Paternal Grandfather   ? ? ?SOCHx:  ? reports that she quit smoking about 59 years ago. Her smoking use included cigarettes. She has a 1.50 pack-year smoking history. She has never used smokeless tobacco. She reports current alcohol use of about 2.0 standard drinks per week. She reports that she does not use drugs. ? ?ALLERGIES:  ?Allergies  ?Allergen Reactions  ? Irbesartan Nausea Only   ? ? ?ROS: ?Pertinent items noted in HPI and remainder of comprehensive ROS otherwise negative. ? ?HOME MEDS: ?Current Outpatient Medications  ?Medication Sig Dispense Refill  ? acetaminophen (TYLENOL) 325 MG tabl

## 2021-09-03 ENCOUNTER — Other Ambulatory Visit (HOSPITAL_COMMUNITY): Payer: Self-pay

## 2021-09-10 ENCOUNTER — Encounter: Payer: Self-pay | Admitting: Orthopedic Surgery

## 2021-09-10 ENCOUNTER — Other Ambulatory Visit (HOSPITAL_COMMUNITY): Payer: Self-pay

## 2021-09-10 ENCOUNTER — Non-Acute Institutional Stay: Payer: Medicare PPO | Admitting: Orthopedic Surgery

## 2021-09-10 VITALS — BP 141/63 | HR 74 | Temp 96.6°F | Resp 16 | Ht 64.0 in | Wt 109.9 lb

## 2021-09-10 DIAGNOSIS — Z0000 Encounter for general adult medical examination without abnormal findings: Secondary | ICD-10-CM

## 2021-09-10 NOTE — Progress Notes (Signed)
? ?Subjective:  ? Shelley Flores is a 88 y.o. female who presents for Medicare Annual (Subsequent) preventive examination. ? ?Place of Service: [facility] ?Provider: Windell Moulding, AGNP-C  ? ?Review of Systems    ? ?Cardiac Risk Factors include: advanced age (>60men, >97 women);hypertension ? ?   ?Objective:  ?  ?Today's Vitals  ? 09/10/21 1519  ?BP: (!) 141/63  ?Pulse: 74  ?Resp: 16  ?Temp: (!) 96.6 ?F (35.9 ?C)  ?SpO2: 100%  ?Weight: 109 lb 14.4 oz (49.9 kg)  ?Height: 5\' 4"  (1.626 m)  ? ?Body mass index is 18.86 kg/m?. ? ? ?  09/10/2021  ?  3:33 PM 11/08/2020  ?  1:34 PM 09/12/2020  ? 11:48 AM 08/15/2020  ? 10:59 AM 08/15/2020  ? 10:40 AM 05/17/2020  ?  1:19 PM 05/15/2020  ? 12:01 PM  ?Advanced Directives  ?Does Patient Have a Medical Advance Directive? Yes Yes Yes Yes Yes Yes Yes  ?Type of Paramedic of [place];Living will [place];Living will;Out of facility DNR (pink MOST or yellow form) [place];Living will Healthcare Power of [place] of [place] of [place];Living will  ?Does patient want to make changes to medical advance directive? No - Patient declined No - Guardian declined  No - Patient declined No - Patient declined No - Patient declined No - Patient declined  ?Copy of [place] in Chart? Yes - validated most recent copy scanned in chart (See row information) Yes - validated most recent copy scanned in chart (See row information) Yes - validated most recent copy scanned in chart (See row information) Yes - validated most recent copy scanned in chart (See row information) Yes - validated most recent copy scanned in chart (See row information) Yes - validated most recent copy scanned in chart (See row information) Yes - validated most recent copy scanned in chart (See row information)  ?Pre-existing out of facility DNR order (yellow form or pink MOST form)   Yellow form placed in chart (order not valid for inpatient use)       ? ? ?Current Medications (verified) ?Outpatient Encounter Medications as of 09/10/2021  ?Medication Sig  ? acetaminophen (TYLENOL) 325 MG tablet Take 2 tablets (650 mg total) by mouth every 6 (six) hours as needed for mild pain (or Fever >/= 101).  ? amLODipine (NORVASC) 2.5 MG tablet TAKE 1 TABLET BY MOUTH EVERY DAY  ? aspirin 81 MG tablet Take 81 mg by mouth daily.  ? atorvastatin (LIPITOR) 20 MG tablet TAKE 1 TABLET BY MOUTH EVERYDAY AT BEDTIME  ? ketoconazole (NIZORAL) 2 % shampoo Apply topically.  ? loperamide (IMODIUM) 2 MG capsule Take by mouth as needed for diarrhea or loose stools.  ? metoprolol succinate (TOPROL-XL) 50 MG 24 hr tablet TAKE 1 TABLET BY MOUTH DAILY. TAKE WITH OR IMMEDIATELY FOLLOWING A MEAL.  ? Multiple Vitamin (MULTIVITAMIN) tablet Take 1 tablet by mouth daily.  ? osimertinib mesylate (TAGRISSO) 40 MG tablet Take 1 tablet (40 mg total) by mouth daily.  ? Polyethyl Glycol-Propyl Glycol (SYSTANE HYDRATION PF OP) Apply to eye.  ? spironolactone (ALDACTONE) 25 MG tablet Take 1 tablet (25 mg total) by mouth daily.  ? White Petrolatum-Mineral Oil ([place] PETROL-MINERAL OIL-LANOLIN) 0.1-0.1 % OINT Apply to eye.  ? ?No facility-administered encounter medications on file as of 09/10/2021.  ? ? ?Allergies (verified) ?Irbesartan  ? ?History: ?Past Medical History:  ?Diagnosis  Date  ? Bruit   ? Abdominal bruit - Abdominal aorta/renal duplex Doppler evaluation 12/07/03 -Mildly abnormal evaluation. 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 This is consistant w/median arcuate ligament compression syndrome. *Right & Left Kidney: Essentially equal in size, symmetrical in shape w/no significant abnormalities visualized. *Right & Left Renal Arteries: No significant abnormalities.  ? Cancer ([place])   ? Edema extremities   ? LE edema   ? H/O myocardial perfusion scan 02/20/00  ? To rule out ischemia - Negative adequate Bruce protocol  exercise stress test with a deconditioned exercise response and normal static myocardial perfusion images with EF calculated by QGS of 77%. Represents a low risk study.  ? Hyperlipidemia   ? Hypertension   ? Osteoarthritis   ? Osteoporosis   ? Pleural effusion 04/2020  ? Valvular heart disease   ? 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 EF was normal.  ? ?Past Surgical History:  ?Procedure Laterality Date  ? Abdominal aorta/Renal duplex Doppler Evaluation  12/07/03  ? For abdominal bruit. Mildly abnormal evaluation. (See bruit in medical history)  ? BREAST EXCISIONAL BIOPSY Left 1966  ? BREAST EXCISIONAL BIOPSY Left 1999  ? CATARACT EXTRACTION  2003  ? CHEST TUBE INSERTION N/A 07/04/2020  ? Procedure: REMOVAL PLEURAL DRAINAGE CATHETER;  Surgeon: Garner Nash, DO;  Location: [facility];  Service: Pulmonary;  Laterality: N/A;  ? COLONOSCOPY  10/22/2004  ? DEXA Bone Scan  06/23/2013  ? IR PERC PLEURAL DRAIN W/INDWELL CATH W/IMG GUIDE  05/10/2020  ? IR THORACENTESIS ASP PLEURAL SPACE W/IMG GUIDE  05/04/2020  ? ?Family History  ?Problem Relation Age of Onset  ? Leukemia Mother 12  ? Cancer Father   ?     esophagial cancer  ? Heart failure Maternal Grandmother   ? Tuberculosis Maternal Grandfather   ? Heart disease Paternal Grandmother   ? Cancer Paternal Grandfather   ? ?Social History  ? ?Socioeconomic History  ? Marital status: Single  ?  Spouse name: Not on file  ? Number of children: Not on file  ? Years of education: Not on file  ? Highest education level: Not on file  ?Occupational History  ? Occupation: Retired  ?  Employer: [employer]  ?  Comment: Chemistry professor  ?Tobacco Use  ? Smoking status: Former  ?  Packs/day: 0.25  ?  Years: 6.00  ?  Pack years: 1.50  ?  Types: Cigarettes  ?  Quit date: 05/13/1962  ?  Years since quitting: 59.3  ? Smokeless tobacco: Never  ?Vaping Use  ? Vaping Use: Never  used  ?Substance and Sexual Activity  ? Alcohol use: Yes  ?  Alcohol/week: 2.0 standard drinks  ?  Types: 2 Standard drinks or equivalent per week  ?  Comment: eine  ? Drug use: No  ? Sexual activity: Not on file  ?Other Topics Concern  ? Not on file  ?Social History Narrative  ? Not on file  ? ?Social Determinants of Health  ? ?Financial Resource Strain: Low Risk   ? Difficulty of Paying Living Expenses: Not hard at all  ?Food Insecurity: No Food Insecurity  ? Worried About Charity fundraiser in the Last Year: Never true  ? Ran Out of Food in the Last Year: Never true  ?Transportation Needs: No Transportation Needs  ? Lack of Transportation (Medical): No  ?  Lack of Transportation (Non-Medical): No  ?Physical Activity: Sufficiently Active  ? Days of Exercise per Week: 7 days  ? Minutes of Exercise per Session: 30 min  ?Stress: No Stress Concern Present  ? Feeling of Stress : Not at all  ?Social Connections: Socially Isolated  ? Frequency of Communication with Friends and Family: Twice a week  ? Frequency of Social Gatherings with Friends and Family: Three times a week  ? Attends Religious Services: Never  ? Active Member of Clubs or Organizations: No  ? Attends Archivist Meetings: Never  ? Marital Status: Never married  ? ? ?Tobacco Counseling ?Counseling given: Not Answered ? ? ?Clinical Intake: ? ?Pre-visit preparation completed: No ? ?Pain : No/denies pain ? ?  ? ?BMI - recorded: 18.86 ?Nutritional Status: BMI <19  Underweight ?Nutritional Risks: None ?Diabetes: No ? ?How often do you need to have someone help you when you read instructions, pamphlets, or other written materials from your doctor or pharmacy?: 1 - Never ?What is the last grade level you completed in school?: PHD chemistry ? ?Diabetic?No ? ?  ? ?  ? ? ?Activities of Daily Living ? ?  09/10/2021  ?  3:54 PM  ?In your present state of health, do you have any difficulty performing the following activities:  ?Hearing? 1  ?Vision? 0   ?Difficulty concentrating or making decisions? 0  ?Walking or climbing stairs? 1  ?Dressing or bathing? 0  ?Doing errands, shopping? 0  ?Preparing Food and eating ? N  ?Using the Toilet? N  ?In the past six months, h

## 2021-09-10 NOTE — Patient Instructions (Addendum)
?  [name] , ?Thank you for taking time to come for your Medicare Wellness Visit. I appreciate your ongoing commitment to your health goals. Please review the following plan we discussed and let me know if I can assist you in the future.  ? ?These are the goals we discussed: ? Goals   ? ?  Maintain Mobility and Function   ?  Evidence-based guidance:  ?Emphasize the importance of physical activity and aerobic exercise as included in treatment plan; assess barriers to adherence; consider patient's abilities and preferences.  ?Encourage gradual increase in activity or exercise instead of stopping if pain occurs.  ?Reinforce individual therapy exercise prescription, such as strengthening, stabilization and stretching programs.  ?Promote optimal body mechanics to stabilize the spine with lifting and functional activity.  ?Encourage activity and mobility modifications to facilitate optimal function, such as using a log roll for bed mobility or dressing from a seated position.  ?Reinforce individual adaptive equipment recommendations to limit excessive spinal movements, such as a Systems analyst.  ?Assess adequacy of sleep; encourage use of sleep hygiene techniques, such as bedtime routine; use of white noise; dark, cool bedroom; avoiding daytime naps, heavy meals or exercise before bedtime.  ?Promote positions and modification to optimize sleep and sexual activity; consider pillows or positioning devices to assist in maintaining neutral spine.  ?Explore options for applying ergonomic principles at work and home, such as frequent position changes, using ergonomically designed equipment and working at optimal height.  ?Promote modifications to increase comfort with driving such as lumbar support, optimizing seat and steering wheel position, using cruise control and taking frequent rest stops to stretch and walk.   ?Notes:  ?  ? ?  ?  ?This is a list of the screening recommended for you and due dates:  ?Health  Maintenance  ?Topic Date Due  ? Zoster (Shingles) Vaccine (1 of 2) Never done  ? COVID-19 Vaccine (4 - Booster) 03/28/2021  ? Flu Shot  12/11/2021  ? Tetanus Vaccine  10/17/2029  ? Pneumonia Vaccine  Completed  ? DEXA scan (bone density measurement)  Completed  ? HPV Vaccine  Aged Out  ? ?Consider getting Prevnar 20 (newest pneumonia vaccine) and shingrix vacicne due to lung cancer  ? ? ?

## 2021-09-11 ENCOUNTER — Other Ambulatory Visit (HOSPITAL_COMMUNITY): Payer: Self-pay

## 2021-09-25 ENCOUNTER — Other Ambulatory Visit: Payer: Self-pay | Admitting: Internal Medicine

## 2021-09-26 ENCOUNTER — Inpatient Hospital Stay: Payer: Medicare PPO | Attending: Physician Assistant

## 2021-09-26 ENCOUNTER — Other Ambulatory Visit: Payer: Self-pay

## 2021-09-26 ENCOUNTER — Inpatient Hospital Stay: Payer: Medicare PPO | Admitting: Internal Medicine

## 2021-09-26 VITALS — BP 141/65 | HR 76 | Temp 97.3°F | Resp 18 | Wt 107.5 lb

## 2021-09-26 DIAGNOSIS — C3490 Malignant neoplasm of unspecified part of unspecified bronchus or lung: Secondary | ICD-10-CM | POA: Diagnosis not present

## 2021-09-26 DIAGNOSIS — Z7982 Long term (current) use of aspirin: Secondary | ICD-10-CM | POA: Insufficient documentation

## 2021-09-26 DIAGNOSIS — C7931 Secondary malignant neoplasm of brain: Secondary | ICD-10-CM | POA: Insufficient documentation

## 2021-09-26 DIAGNOSIS — R21 Rash and other nonspecific skin eruption: Secondary | ICD-10-CM | POA: Insufficient documentation

## 2021-09-26 DIAGNOSIS — C3492 Malignant neoplasm of unspecified part of left bronchus or lung: Secondary | ICD-10-CM

## 2021-09-26 DIAGNOSIS — Z5111 Encounter for antineoplastic chemotherapy: Secondary | ICD-10-CM

## 2021-09-26 DIAGNOSIS — C3412 Malignant neoplasm of upper lobe, left bronchus or lung: Secondary | ICD-10-CM | POA: Diagnosis not present

## 2021-09-26 DIAGNOSIS — Z79899 Other long term (current) drug therapy: Secondary | ICD-10-CM | POA: Insufficient documentation

## 2021-09-26 LAB — CBC WITH DIFFERENTIAL (CANCER CENTER ONLY)
Abs Immature Granulocytes: 0.02 10*3/uL (ref 0.00–0.07)
Basophils Absolute: 0 10*3/uL (ref 0.0–0.1)
Basophils Relative: 1 %
Eosinophils Absolute: 0.3 10*3/uL (ref 0.0–0.5)
Eosinophils Relative: 3 %
HCT: 37.7 % (ref 36.0–46.0)
Hemoglobin: 12.5 g/dL (ref 12.0–15.0)
Immature Granulocytes: 0 %
Lymphocytes Relative: 14 %
Lymphs Abs: 1.1 10*3/uL (ref 0.7–4.0)
MCH: 30.7 pg (ref 26.0–34.0)
MCHC: 33.2 g/dL (ref 30.0–36.0)
MCV: 92.6 fL (ref 80.0–100.0)
Monocytes Absolute: 0.7 10*3/uL (ref 0.1–1.0)
Monocytes Relative: 9 %
Neutro Abs: 5.6 10*3/uL (ref 1.7–7.7)
Neutrophils Relative %: 73 %
Platelet Count: 203 10*3/uL (ref 150–400)
RBC: 4.07 MIL/uL (ref 3.87–5.11)
RDW: 14.7 % (ref 11.5–15.5)
WBC Count: 7.8 10*3/uL (ref 4.0–10.5)
nRBC: 0 % (ref 0.0–0.2)

## 2021-09-26 LAB — CMP (CANCER CENTER ONLY)
ALT: 14 U/L (ref 0–44)
AST: 19 U/L (ref 15–41)
Albumin: 4.1 g/dL (ref 3.5–5.0)
Alkaline Phosphatase: 61 U/L (ref 38–126)
Anion gap: 4 — ABNORMAL LOW (ref 5–15)
BUN: 37 mg/dL — ABNORMAL HIGH (ref 8–23)
CO2: 29 mmol/L (ref 22–32)
Calcium: 10.7 mg/dL — ABNORMAL HIGH (ref 8.9–10.3)
Chloride: 102 mmol/L (ref 98–111)
Creatinine: 1.34 mg/dL — ABNORMAL HIGH (ref 0.44–1.00)
GFR, Estimated: 39 mL/min — ABNORMAL LOW (ref >60)
Glucose, Bld: 97 mg/dL (ref 70–99)
Potassium: 4.5 mmol/L (ref 3.5–5.1)
Sodium: 135 mmol/L (ref 135–145)
Total Bilirubin: 0.4 mg/dL (ref 0.3–1.2)
Total Protein: 6.7 g/dL (ref 6.5–8.1)

## 2021-09-26 NOTE — Progress Notes (Signed)
?    [place] ?Telephone:([phone]) [phone]   [phone] ? ?OFFICE PROGRESS NOTE ? ?Shelley Dad, MD ?[address] [place] ?[place] [phone] ? ?DIAGNOSIS: Stage IV (T2b, N2, M1c), non-small cell lung cancer, adenocarcinoma.  The patient presented with a left upper lobe mass, right supra clavicular, left hilar and subcarinal lymphadenopathy as well as diffuse left-sided pleural disease with associated malignant left pleural effusion and 2 small lymph nodes near the SMA in addition to multiple brain metastasis diagnosed in December 2021.  ? ?Molecular studies by [facility] showed positive EGFR mutation with deletion in exon 19 ? ?PRIOR THERAPY: None ? ?CURRENT THERAPY: Tagrisso 80 mg p.o. daily. First dose June 10, 2020.  Status post 14.5 months of treatment.  Starting October 2022 the patient is treated with Tagrisso 40 mg p.o. daily because of the intolerance. ? ?INTERVAL HISTORY: ?Shelley Flores 88 y.o. female returns to the clinic today for follow-up visit.  The patient is feeling fine today with no concerning complaints except for the mild rash on the scalp and she is currently using ketoconazole shampoo.  She has no significant diarrhea.  She has intermittent left-sided chest pain but no significant shortness of breath, cough or hemoptysis.  She has no nausea, vomiting, diarrhea or constipation.  She has no headache or visual changes.  She denied having any significant weight loss or night sweats.  She is here today for evaluation and repeat blood work. ? ?MEDICAL HISTORY: ?Past Medical History:  ?Diagnosis Date  ? Bruit   ? Abdominal bruit - Abdominal aorta/renal duplex Doppler evaluation 12/07/03 -Mildly abnormal evaluation. 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 This is consistant w/median arcuate ligament compression syndrome. *Right & Left Kidney: Essentially equal in size, symmetrical in shape w/no significant abnormalities visualized. *Right & Left Renal  Arteries: No significant abnormalities.  ? Cancer ([place])   ? Edema extremities   ? LE edema   ? H/O myocardial perfusion scan 02/20/00  ? To rule out ischemia - Negative adequate Bruce protocol exercise stress test with a deconditioned exercise response and normal static myocardial perfusion images with EF calculated by QGS of 77%. Represents a low risk study.  ? Hyperlipidemia   ? Hypertension   ? Osteoarthritis   ? Osteoporosis   ? Pleural effusion 04/2020  ? Valvular heart disease   ? 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 EF was normal.  ? ? ?ALLERGIES:  is allergic to irbesartan. ? ?MEDICATIONS:  ?Current Outpatient Medications  ?Medication Sig Dispense Refill  ? acetaminophen (TYLENOL) 325 MG tablet Take 2 tablets (650 mg total) by mouth every 6 (six) hours as needed for mild pain (or Fever >/= 101). 20 tablet 0  ? amLODipine (NORVASC) 2.5 MG tablet TAKE 1 TABLET BY MOUTH EVERY DAY 90 tablet 1  ? aspirin 81 MG tablet Take 81 mg by mouth daily.    ? atorvastatin (LIPITOR) 20 MG tablet TAKE 1 TABLET BY MOUTH EVERYDAY AT BEDTIME 90 tablet 1  ? ketoconazole (NIZORAL) 2 % shampoo Apply topically.    ? loperamide (IMODIUM) 2 MG capsule Take by mouth as needed for diarrhea or loose stools.    ? metoprolol succinate (TOPROL-XL) 50 MG 24 hr tablet TAKE 1 TABLET BY MOUTH DAILY. TAKE WITH OR IMMEDIATELY FOLLOWING A MEAL. 90 tablet 3  ? Multiple Vitamin (MULTIVITAMIN) tablet Take 1  tablet by mouth daily.    ? osimertinib mesylate (TAGRISSO) 40 MG tablet Take 1 tablet (40 mg total) by mouth daily. 30 tablet 3  ? Polyethyl Glycol-Propyl Glycol (SYSTANE HYDRATION PF OP) Apply to eye.    ? spironolactone (ALDACTONE) 25 MG tablet Take 1 tablet (25 mg total) by mouth daily. 90 tablet 3  ? White Petrolatum-Mineral Oil ([place] PETROL-MINERAL OIL-LANOLIN) 0.1-0.1 % OINT Apply to eye.    ? ?No current facility-administered  medications for this visit.  ? ? ?SURGICAL HISTORY:  ?Past Surgical History:  ?Procedure Laterality Date  ? Abdominal aorta/Renal duplex Doppler Evaluation  12/07/03  ? For abdominal bruit. Mildly abnormal evaluation. (See bruit in medical history)  ? BREAST EXCISIONAL BIOPSY Left 1966  ? BREAST EXCISIONAL BIOPSY Left 1999  ? CATARACT EXTRACTION  2003  ? CHEST TUBE INSERTION N/A 07/04/2020  ? Procedure: REMOVAL PLEURAL DRAINAGE CATHETER;  Surgeon: Garner Nash, DO;  Location: [facility];  Service: Pulmonary;  Laterality: N/A;  ? COLONOSCOPY  10/22/2004  ? DEXA Bone Scan  06/23/2013  ? IR PERC PLEURAL DRAIN W/INDWELL CATH W/IMG GUIDE  05/10/2020  ? IR THORACENTESIS ASP PLEURAL SPACE W/IMG GUIDE  05/04/2020  ? ? ?REVIEW OF SYSTEMS:  A comprehensive review of systems was negative except for: Respiratory: positive for pleurisy/chest pain ?Gastrointestinal: positive for constipation  ? ?PHYSICAL EXAMINATION: General appearance: alert, cooperative, and no distress ?Head: Normocephalic, without obvious abnormality, atraumatic ?Neck: no adenopathy, no JVD, supple, symmetrical, trachea midline, and thyroid not enlarged, symmetric, no tenderness/mass/nodules ?Lymph nodes: Cervical, supraclavicular, and axillary nodes normal. ?Resp: clear to auscultation bilaterally ?Back: symmetric, no curvature. ROM normal. No CVA tenderness. ?Cardio: regular rate and rhythm, S1, S2 normal, no murmur, click, rub or gallop ?GI: soft, non-tender; bowel sounds normal; no masses,  no organomegaly ?Extremities: extremities normal, atraumatic, no cyanosis or edema ? ?ECOG PERFORMANCE STATUS: 1 - Symptomatic but completely ambulatory ? ?Blood pressure (!) 141/65, pulse 76, temperature (!) 97.3 ?F (36.3 ?C), temperature source Tympanic, resp. rate 18, weight 107 lb 8 oz (48.8 kg), SpO2 99 %. ? ?LABORATORY DATA: ?Lab Results  ?Component Value Date  ? WBC 7.8 09/26/2021  ? HGB 12.5 09/26/2021  ? HCT 37.7 09/26/2021  ? MCV 92.6 09/26/2021  ? PLT 203  09/26/2021  ? ? ?  Chemistry   ?   ?Component Value Date/Time  ? NA 135 09/26/2021 0949  ? NA 137 05/25/2020 0000  ? K 4.5 09/26/2021 0949  ? CL 102 09/26/2021 0949  ? CO2 29 09/26/2021 0949  ? BUN 37 (H) 09/26/2021 0949  ? BUN 12 05/25/2020 0000  ? CREATININE 1.34 (H) 09/26/2021 0949  ? CREATININE 1.22 (H) 05/21/2021 0800  ? GLU 83 05/25/2020 0000  ?    ?Component Value Date/Time  ? CALCIUM 10.7 (H) 09/26/2021 0949  ? ALKPHOS 61 09/26/2021 0949  ? AST 19 09/26/2021 0949  ? ALT 14 09/26/2021 0949  ? BILITOT 0.4 09/26/2021 0949  ?  ? ? ? ?RADIOGRAPHIC STUDIES: ?No results found. ? ? ?ASSESSMENT AND PLAN: This is a very pleasant 88 years old white female recently diagnosed with a stage IV (T3, N3, M1 C) non-small cell lung cancer, adenocarcinoma with positive EGFR mutation with deletion in exon 19 diagnosed in December 2021 and presented with left lower lobe lung mass in addition to right supraclavicular, left hilar and subcarinal lymphadenopathy as well as metastatic lymphadenopathy in the abdomen and left pleural-based metastasis as well as malignant left pleural effusion and  multiple brain metastasis. ?The molecular study showed positive EGFR mutation with deletion in exon 19. ?The patient started treatment with Tagrisso 80 mg p.o. daily on June 10, 2020.  Starting from October 2022 her dose has changed to 40 mg p.o. daily.  She is status post a total of 14.5 months of treatment and has been tolerating this treatment well except for the mild scalp rash. ?I recommended for the patient to continue her current treatment with Tagrisso with the same dose. ?I will see her back for follow-up visit in 6 weeks for evaluation with repeat CT scan of the chest, abdomen and pelvis for restaging of her disease. ?For the scalp rash, she will try ketoconazole 2% shampoo. ?She was also advised to proceed with her COVID-19 booster shot as well as pneumonia vaccine as recommended by her primary care provider. ?The patient was  advised to call immediately if she has any other concerning symptoms in the interval. ?The patient voices understanding of current disease status and treatment options and is in agreement with the current care

## 2021-09-27 ENCOUNTER — Ambulatory Visit: Payer: Medicare PPO | Admitting: Internal Medicine

## 2021-09-27 ENCOUNTER — Other Ambulatory Visit: Payer: Medicare PPO

## 2021-10-03 ENCOUNTER — Other Ambulatory Visit: Payer: Self-pay | Admitting: Internal Medicine

## 2021-10-03 ENCOUNTER — Other Ambulatory Visit (HOSPITAL_COMMUNITY): Payer: Self-pay

## 2021-10-03 DIAGNOSIS — C3492 Malignant neoplasm of unspecified part of left bronchus or lung: Secondary | ICD-10-CM

## 2021-10-03 MED ORDER — OSIMERTINIB MESYLATE 40 MG PO TABS
40.0000 mg | ORAL_TABLET | Freq: Every day | ORAL | 3 refills | Status: DC
  Filled 2021-10-05: qty 30, 30d supply, fill #0
  Filled 2021-10-30: qty 30, 30d supply, fill #1
  Filled 2021-11-30: qty 30, 30d supply, fill #2
  Filled 2021-12-31: qty 30, 30d supply, fill #3

## 2021-10-05 ENCOUNTER — Encounter: Payer: Self-pay | Admitting: Internal Medicine

## 2021-10-05 ENCOUNTER — Other Ambulatory Visit (HOSPITAL_COMMUNITY): Payer: Self-pay

## 2021-10-10 ENCOUNTER — Other Ambulatory Visit (HOSPITAL_COMMUNITY): Payer: Self-pay

## 2021-10-15 ENCOUNTER — Telehealth: Payer: Self-pay | Admitting: Internal Medicine

## 2021-10-15 NOTE — Telephone Encounter (Signed)
Called patient regarding upcoming June appointments, patient is notified.  

## 2021-10-30 ENCOUNTER — Other Ambulatory Visit (HOSPITAL_COMMUNITY): Payer: Self-pay

## 2021-11-05 ENCOUNTER — Other Ambulatory Visit: Payer: Medicare PPO

## 2021-11-05 ENCOUNTER — Ambulatory Visit (HOSPITAL_COMMUNITY)
Admission: RE | Admit: 2021-11-05 | Discharge: 2021-11-05 | Disposition: A | Discharge status: Home / Self Care | Payer: Medicare PPO | Source: Ambulatory Visit | Attending: Internal Medicine | Admitting: Internal Medicine

## 2021-11-05 ENCOUNTER — Inpatient Hospital Stay: Payer: Medicare PPO | Attending: Physician Assistant

## 2021-11-05 DIAGNOSIS — K862 Cyst of pancreas: Secondary | ICD-10-CM | POA: Diagnosis not present

## 2021-11-05 DIAGNOSIS — R21 Rash and other nonspecific skin eruption: Secondary | ICD-10-CM | POA: Diagnosis not present

## 2021-11-05 DIAGNOSIS — J984 Other disorders of lung: Secondary | ICD-10-CM | POA: Diagnosis not present

## 2021-11-05 DIAGNOSIS — Z79899 Other long term (current) drug therapy: Secondary | ICD-10-CM | POA: Insufficient documentation

## 2021-11-05 DIAGNOSIS — K769 Liver disease, unspecified: Secondary | ICD-10-CM | POA: Diagnosis not present

## 2021-11-05 DIAGNOSIS — N281 Cyst of kidney, acquired: Secondary | ICD-10-CM | POA: Diagnosis not present

## 2021-11-05 DIAGNOSIS — C3412 Malignant neoplasm of upper lobe, left bronchus or lung: Secondary | ICD-10-CM | POA: Diagnosis not present

## 2021-11-05 DIAGNOSIS — C3490 Malignant neoplasm of unspecified part of unspecified bronchus or lung: Secondary | ICD-10-CM

## 2021-11-05 DIAGNOSIS — C7931 Secondary malignant neoplasm of brain: Secondary | ICD-10-CM | POA: Insufficient documentation

## 2021-11-05 DIAGNOSIS — Z7982 Long term (current) use of aspirin: Secondary | ICD-10-CM | POA: Insufficient documentation

## 2021-11-05 DIAGNOSIS — D7389 Other diseases of spleen: Secondary | ICD-10-CM | POA: Diagnosis not present

## 2021-11-05 DIAGNOSIS — J90 Pleural effusion, not elsewhere classified: Secondary | ICD-10-CM | POA: Diagnosis not present

## 2021-11-05 LAB — CMP (CANCER CENTER ONLY)
ALT: 14 U/L (ref 0–44)
AST: 18 U/L (ref 15–41)
Albumin: 4.2 g/dL (ref 3.5–5.0)
Alkaline Phosphatase: 58 U/L (ref 38–126)
Anion gap: 4 — ABNORMAL LOW (ref 5–15)
BUN: 33 mg/dL — ABNORMAL HIGH (ref 8–23)
CO2: 27 mmol/L (ref 22–32)
Calcium: 10.8 mg/dL — ABNORMAL HIGH (ref 8.9–10.3)
Chloride: 103 mmol/L (ref 98–111)
Creatinine: 1.26 mg/dL — ABNORMAL HIGH (ref 0.44–1.00)
GFR, Estimated: 42 mL/min — ABNORMAL LOW (ref >60)
Glucose, Bld: 102 mg/dL — ABNORMAL HIGH (ref 70–99)
Potassium: 4.6 mmol/L (ref 3.5–5.1)
Sodium: 134 mmol/L — ABNORMAL LOW (ref 135–145)
Total Bilirubin: 0.4 mg/dL (ref 0.3–1.2)
Total Protein: 6.7 g/dL (ref 6.5–8.1)

## 2021-11-05 LAB — CBC WITH DIFFERENTIAL (CANCER CENTER ONLY)
Abs Immature Granulocytes: 0.02 10*3/uL (ref 0.00–0.07)
Basophils Absolute: 0 10*3/uL (ref 0.0–0.1)
Basophils Relative: 0 %
Eosinophils Absolute: 0.2 10*3/uL (ref 0.0–0.5)
Eosinophils Relative: 3 %
HCT: 36.3 % (ref 36.0–46.0)
Hemoglobin: 12.4 g/dL (ref 12.0–15.0)
Immature Granulocytes: 0 %
Lymphocytes Relative: 10 %
Lymphs Abs: 0.8 10*3/uL (ref 0.7–4.0)
MCH: 31.2 pg (ref 26.0–34.0)
MCHC: 34.2 g/dL (ref 30.0–36.0)
MCV: 91.2 fL (ref 80.0–100.0)
Monocytes Absolute: 0.7 10*3/uL (ref 0.1–1.0)
Monocytes Relative: 8 %
Neutro Abs: 6.4 10*3/uL (ref 1.7–7.7)
Neutrophils Relative %: 79 %
Platelet Count: 218 10*3/uL (ref 150–400)
RBC: 3.98 MIL/uL (ref 3.87–5.11)
RDW: 14.9 % (ref 11.5–15.5)
WBC Count: 8.2 10*3/uL (ref 4.0–10.5)
nRBC: 0 % (ref 0.0–0.2)

## 2021-11-05 MED ORDER — IOHEXOL 300 MG/ML  SOLN
80.0000 mL | Freq: Once | INTRAMUSCULAR | Status: AC | PRN
  Administered 2021-11-05: 80 mL via INTRAVENOUS

## 2021-11-07 ENCOUNTER — Encounter: Payer: Medicare PPO | Admitting: Internal Medicine

## 2021-11-08 ENCOUNTER — Other Ambulatory Visit: Payer: Self-pay | Admitting: Internal Medicine

## 2021-11-08 ENCOUNTER — Inpatient Hospital Stay: Payer: Medicare PPO | Admitting: Internal Medicine

## 2021-11-08 ENCOUNTER — Other Ambulatory Visit (HOSPITAL_COMMUNITY): Payer: Self-pay

## 2021-11-08 ENCOUNTER — Other Ambulatory Visit: Payer: Self-pay

## 2021-11-08 VITALS — BP 141/54 | HR 79 | Temp 97.6°F | Resp 16 | Ht 64.0 in | Wt 109.0 lb

## 2021-11-08 DIAGNOSIS — R21 Rash and other nonspecific skin eruption: Secondary | ICD-10-CM | POA: Diagnosis not present

## 2021-11-08 DIAGNOSIS — C3492 Malignant neoplasm of unspecified part of left bronchus or lung: Secondary | ICD-10-CM | POA: Diagnosis not present

## 2021-11-08 DIAGNOSIS — Z5111 Encounter for antineoplastic chemotherapy: Secondary | ICD-10-CM

## 2021-11-08 DIAGNOSIS — C3412 Malignant neoplasm of upper lobe, left bronchus or lung: Secondary | ICD-10-CM | POA: Diagnosis not present

## 2021-11-08 DIAGNOSIS — Z7982 Long term (current) use of aspirin: Secondary | ICD-10-CM | POA: Diagnosis not present

## 2021-11-08 DIAGNOSIS — C7931 Secondary malignant neoplasm of brain: Secondary | ICD-10-CM | POA: Diagnosis not present

## 2021-11-08 DIAGNOSIS — Z79899 Other long term (current) drug therapy: Secondary | ICD-10-CM | POA: Diagnosis not present

## 2021-11-08 NOTE — Progress Notes (Signed)
[facility] [phone]   [phone]  OFFICE PROGRESS NOTE  [name], MD [place] [address]  DIAGNOSIS: Stage IV (T2b, N2, M1c), non-small cell lung cancer, adenocarcinoma.  The patient presented with a left upper lobe mass, right supra clavicular, left hilar and subcarinal lymphadenopathy as well as diffuse left-sided pleural disease with associated malignant left pleural effusion and 2 small lymph nodes near the SMA in addition to multiple brain metastasis diagnosed in December 2021.   Molecular studies by [facility] showed positive EGFR mutation with deletion in exon 19  PRIOR THERAPY: None  CURRENT THERAPY: Tagrisso 80 mg p.o. daily. First dose June 10, 2020.  Status post 16 months of treatment.  Starting October 2022 the patient is treated with Tagrisso 40 mg p.o. daily because of the intolerance.  INTERVAL HISTORY: Shelley Flores 88 y.o. female returns to the clinic today for follow-up visit.  The patient is feeling fine today with no concerning complaints except for the intermittent pain on the left side of the chest but no significant shortness of breath, cough or hemoptysis.  She has no nausea, vomiting, diarrhea or constipation.  She has no headache or visual changes.  She denied having any fever or chills.  She has been tolerating her treatment with Tagrisso fairly well.  The patient had repeat CT scan of the chest, abdomen and pelvis performed recently and she is here for evaluation and discussion of her scan results.  MEDICAL HISTORY: Past Medical History:  Diagnosis Date   Bruit    Abdominal bruit - Abdominal aorta/renal duplex Doppler evaluation 12/07/03 -Mildly abnormal evaluation. 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 This is consistant w/median arcuate ligament compression syndrome. *Right & Left Kidney: Essentially equal in size, symmetrical in shape w/no significant abnormalities  visualized. *Right & Left Renal Arteries: No significant abnormalities.   Cancer ([place])    Edema extremities    LE edema    H/O myocardial perfusion scan 02/20/00   To rule out ischemia - Negative adequate Bruce protocol exercise stress test with a deconditioned exercise response and normal static myocardial perfusion images with EF calculated by QGS of 77%. Represents a low risk study.   Hyperlipidemia    Hypertension    Osteoarthritis    Osteoporosis    Pleural effusion 04/2020   Valvular heart disease    Mild valvular heart disease by Echo in 2010, all mild and symptomatic, including concentric LVH, MR, TR, and AI with pulmonary artery pressure of 32. EF was normal.    ALLERGIES:  is allergic to irbesartan.  MEDICATIONS:  Current Outpatient Medications  Medication Sig Dispense Refill   acetaminophen (TYLENOL) 325 MG tablet Take 2 tablets (650 mg total) by mouth every 6 (six) hours as needed for mild pain (or Fever >/= 101). 20 tablet 0   amLODipine (NORVASC) 2.5 MG tablet TAKE 1 TABLET BY MOUTH EVERY DAY 90 tablet 1   aspirin 81 MG tablet Take 81 mg by mouth daily.     atorvastatin (LIPITOR) 20 MG tablet TAKE 1 TABLET BY MOUTH EVERYDAY AT BEDTIME 90 tablet 3   ketoconazole (NIZORAL) 2 % shampoo Apply topically.     loperamide (IMODIUM) 2 MG capsule Take by mouth as needed for diarrhea or loose stools.     metoprolol succinate (TOPROL-XL) 50 MG 24 hr tablet TAKE 1 TABLET BY MOUTH DAILY. TAKE WITH OR IMMEDIATELY FOLLOWING A MEAL. 90 tablet 3  Multiple Vitamin (MULTIVITAMIN) tablet Take 1 tablet by mouth daily.     osimertinib mesylate (TAGRISSO) 40 MG tablet Take 1 tablet (40 mg total) by mouth daily. 30 tablet 3   Polyethyl Glycol-Propyl Glycol (SYSTANE HYDRATION PF OP) Apply to eye.     spironolactone (ALDACTONE) 25 MG tablet Take 1 tablet (25 mg total) by mouth daily. 90 tablet 3   White Petrolatum-Mineral Oil ([place] PETROL-MINERAL OIL-LANOLIN) 0.1-0.1 % OINT Apply to eye.     No  current facility-administered medications for this visit.    SURGICAL HISTORY:  Past Surgical History:  Procedure Laterality Date   Abdominal aorta/Renal duplex Doppler Evaluation  12/07/03   For abdominal bruit. Mildly abnormal evaluation. (See bruit in medical history)   BREAST EXCISIONAL BIOPSY Left 1966   BREAST EXCISIONAL BIOPSY Left 1999   CATARACT EXTRACTION  2003   CHEST TUBE INSERTION N/A 07/04/2020   Procedure: REMOVAL PLEURAL DRAINAGE CATHETER;  Surgeon: Garner Nash, DO;  Location: [facility];  Service: Pulmonary;  Laterality: N/A;   COLONOSCOPY  10/22/2004   DEXA Bone Scan  06/23/2013   IR PERC PLEURAL DRAIN W/INDWELL CATH W/IMG GUIDE  05/10/2020   IR THORACENTESIS ASP PLEURAL SPACE W/IMG GUIDE  05/04/2020    REVIEW OF SYSTEMS:  Constitutional: positive for fatigue Eyes: negative Ears, nose, mouth, throat, and face: negative Respiratory: positive for pleurisy/chest pain Cardiovascular: negative Gastrointestinal: negative Genitourinary:negative Integument/breast: negative Hematologic/lymphatic: negative Musculoskeletal:negative Neurological: negative Behavioral/Psych: negative Endocrine: negative Allergic/Immunologic: negative   PHYSICAL EXAMINATION: General appearance: alert, cooperative, and no distress Head: Normocephalic, without obvious abnormality, atraumatic Neck: no adenopathy, no JVD, supple, symmetrical, trachea midline, and thyroid not enlarged, symmetric, no tenderness/mass/nodules Lymph nodes: Cervical, supraclavicular, and axillary nodes normal. Resp: clear to auscultation bilaterally Back: symmetric, no curvature. ROM normal. No CVA tenderness. Cardio: regular rate and rhythm, S1, S2 normal, no murmur, click, rub or gallop GI: soft, non-tender; bowel sounds normal; no masses,  no organomegaly Extremities: extremities normal, atraumatic, no cyanosis or edema Neurologic: Alert and oriented X 3, normal strength and tone. Normal symmetric reflexes.  Normal coordination and gait  ECOG PERFORMANCE STATUS: 1 - Symptomatic but completely ambulatory  Blood pressure (!) 141/54, pulse 79, temperature 97.6 F (36.4 C), temperature source Oral, resp. rate 16, height _0  (1.626 m), weight 109 lb (49.4 kg), SpO2 99 %.  LABORATORY DATA: Lab Results  Component Value Date   WBC 8.2 11/05/2021   HGB 12.4 11/05/2021   HCT 36.3 11/05/2021   MCV 91.2 11/05/2021   PLT 218 11/05/2021      Chemistry      Component Value Date/Time   NA 134 (L) 11/05/2021 0933   NA 137 05/25/2020 0000   K 4.6 11/05/2021 0933   CL 103 11/05/2021 0933   CO2 27 11/05/2021 0933   BUN 33 (H) 11/05/2021 0933   BUN 12 05/25/2020 0000   CREATININE 1.26 (H) 11/05/2021 0933   CREATININE 1.22 (H) 05/21/2021 0800   GLU 83 05/25/2020 0000      Component Value Date/Time   CALCIUM 10.8 (H) 11/05/2021 0933   ALKPHOS 58 11/05/2021 0933   AST 18 11/05/2021 0933   ALT 14 11/05/2021 0933   BILITOT 0.4 11/05/2021 0933       RADIOGRAPHIC STUDIES: CT Chest W Contrast  Result Date: 11/05/2021 CLINICAL DATA:  Non-small cell lung cancer restaging * Tracking Code: BO * EXAM: CT CHEST, ABDOMEN, AND PELVIS WITH CONTRAST TECHNIQUE: Multidetector CT imaging of the chest, abdomen and pelvis was  performed following the standard protocol during bolus administration of intravenous contrast. RADIATION DOSE REDUCTION: This exam was performed according to the departmental dose-optimization program which includes automated exposure control, adjustment of the mA and/or kV according to patient size and/or use of iterative reconstruction technique. CONTRAST:  52m OMNIPAQUE IOHEXOL 300 MG/ML  SOLN COMPARISON:  Multiple exams, including 08/13/2021 FINDINGS: CT CHEST FINDINGS Cardiovascular: Mild atherosclerotic calcification of the thoracic aorta and left main coronary artery. Faint calcifications noted along the mitral and aortic valves. Mediastinum/Nodes: Right lower paratracheal node 1.4 cm in  short axis on image 24 series 2, formerly 1.0 cm by my measurements. Right hilar node 1.0 cm in short axis on image 28 series 2, formerly the same by my measurements. Small bilateral axillary and subpectoral lymph nodes are present. Potential mild distal esophageal wall thickening, esophagitis would be a common cause. Lungs/Pleura: Small but increased left pleural effusion, with some enhancement along margins of the pleural effusion such that an exudative effusion is not excluded. Mild scarring medially in the right upper lobe. Scarring and volume loss in the right middle lobe. Stable left apical scarring. The dominant density in the right upper lobe measures up to 3.0 by 2.2 by 1.7 cm. By my measurements this was previously 2.1 by 2.5 by 1.5 cm, indicating some mild enlargement in the antrum. However, some of this density represents surrounding atelectasis or scarring, and there is increased bandlike scarring extending caudad from the lesion in the lingula. Interstitial accentuation in the right lower lobe noted inferiorly with some associated volume loss for example on image 104 of series 4. There is associated airway thickening in the left lower lobe. There is some mild confluent airspace opacity anteriorly in the right lower lobe, for example on image 78 series 4. Other minimal nodularity such as the 3 mm right lower lobe nodule on image 104 series 4 appears stable. Musculoskeletal: A sclerotic lesion inferiorly in the T5 vertebra body is present there is a sclerotic lesion in the left T5 vertebral body and pedicle. The left-sided lesion lesion measures 1.7 by 1.5 cm on image 99 series 6, previously 1.5 by 1.4 cm when measured in the same fashion. CT ABDOMEN PELVIS FINDINGS Hepatobiliary: Small hypodense hepatic lesions are stable and likely cysts. Mildly contracted gallbladder. No substantial biliary dilatation. Pancreas: 1.5 by 0.7 cm cystic lesion along the inferior margin of the pancreatic tail on image 52  series 5, stable when measured in a similar fashion on the prior exam, possibilities include a postinflammatory cystic lesion or indolent intraductal papillary mucinous neoplasm, surveillance in the context of the patient's ongoing oncology imaging recommended. Spleen: Small hypodense lesions are stable and likely benign. Adrenals/Urinary Tract: Stable bilateral benign-appearing cysts. These do not require specific follow up. Slightly increased right adrenal nodularity, measuring up to about 0.8 by 1.8 cm on image 55 series 2, surveillance suggested. Urinary bladder unremarkable. Stomach/Bowel: Unremarkable Vascular/Lymphatic: A left periaortic node just above the level of the left renal vein measures 1.0 cm in short axis on image 56 series 2, previously 0.9 cm. Other upper normal size retroperitoneal lymph nodes are present. Atherosclerosis is present, including aortoiliac atherosclerotic disease. Reproductive: Uterus absent.  Adnexa unremarkable. Other: No supplemental non-categorized findings. Musculoskeletal: Stable small sclerotic lesions in the left iliac bone, right upper acetabulum, and L3 vertebral body without new or progressive lesion. Grade 1 degenerative anterolisthesis at L5-S1 with mild grade 1 degenerative retrolisthesis at L2-3 and L3-4. IMPRESSION: 1. The dominant left upper lobe lesion appears mildly  increased in prominence compared to 08/13/2021, although there is notable adjacent scarring/atelectasis which may confused the actual size of the lesion. In addition there is a new small left pleural effusion likely with some enhancement along the pleural surface, as well as interstitial accentuation and reticulonodular opacities in the adjacent left lower lobe which could be from atypical infection. 2. Mildly increased lower paratracheal adenopathy, currently 1.4 cm in short axis. There is also a 1.0 cm in short axis right hilar lymph node, as well as some borderline retroperitoneal adenopathy. 3.  The left eccentric sclerotic lesion at the T5 vertebral level is slightly larger than on the prior exam. The other small suspected metastatic lesions (T5 inferior vertebral body, L3, and bony pelvis) appears stable. 4. Stable 1.5 by 0.7 cm cystic lesion along the inferior margin of the pancreatic tail, possibilities include indolent intraductal papillary mucinous neoplasm or postinflammatory cystic lesion. Surveillance in the context of the patient's ongoing oncology imaging is suggested. 5. Slightly increased conspicuity of right adrenal nodularity, careful attention on follow up studies suggested. 6. Other imaging findings of potential clinical significance: Aortic Atherosclerosis (ICD10-I70.0). Mitral and aortic valve calcification. Renal cysts. Electronically Signed   By: Van Clines M.D.   On: 11/05/2021 17:18   CT Abdomen Pelvis W Contrast  Result Date: 11/05/2021 CLINICAL DATA:  Non-small cell lung cancer restaging * Tracking Code: BO * EXAM: CT CHEST, ABDOMEN, AND PELVIS WITH CONTRAST TECHNIQUE: Multidetector CT imaging of the chest, abdomen and pelvis was performed following the standard protocol during bolus administration of intravenous contrast. RADIATION DOSE REDUCTION: This exam was performed according to the departmental dose-optimization program which includes automated exposure control, adjustment of the mA and/or kV according to patient size and/or use of iterative reconstruction technique. CONTRAST:  36m OMNIPAQUE IOHEXOL 300 MG/ML  SOLN COMPARISON:  Multiple exams, including 08/13/2021 FINDINGS: CT CHEST FINDINGS Cardiovascular: Mild atherosclerotic calcification of the thoracic aorta and left main coronary artery. Faint calcifications noted along the mitral and aortic valves. Mediastinum/Nodes: Right lower paratracheal node 1.4 cm in short axis on image 24 series 2, formerly 1.0 cm by my measurements. Right hilar node 1.0 cm in short axis on image 28 series 2, formerly the same by my  measurements. Small bilateral axillary and subpectoral lymph nodes are present. Potential mild distal esophageal wall thickening, esophagitis would be a common cause. Lungs/Pleura: Small but increased left pleural effusion, with some enhancement along margins of the pleural effusion such that an exudative effusion is not excluded. Mild scarring medially in the right upper lobe. Scarring and volume loss in the right middle lobe. Stable left apical scarring. The dominant density in the right upper lobe measures up to 3.0 by 2.2 by 1.7 cm. By my measurements this was previously 2.1 by 2.5 by 1.5 cm, indicating some mild enlargement in the antrum. However, some of this density represents surrounding atelectasis or scarring, and there is increased bandlike scarring extending caudad from the lesion in the lingula. Interstitial accentuation in the right lower lobe noted inferiorly with some associated volume loss for example on image 104 of series 4. There is associated airway thickening in the left lower lobe. There is some mild confluent airspace opacity anteriorly in the right lower lobe, for example on image 78 series 4. Other minimal nodularity such as the 3 mm right lower lobe nodule on image 104 series 4 appears stable. Musculoskeletal: A sclerotic lesion inferiorly in the T5 vertebra body is present there is a sclerotic lesion in the left  T5 vertebral body and pedicle. The left-sided lesion lesion measures 1.7 by 1.5 cm on image 99 series 6, previously 1.5 by 1.4 cm when measured in the same fashion. CT ABDOMEN PELVIS FINDINGS Hepatobiliary: Small hypodense hepatic lesions are stable and likely cysts. Mildly contracted gallbladder. No substantial biliary dilatation. Pancreas: 1.5 by 0.7 cm cystic lesion along the inferior margin of the pancreatic tail on image 52 series 5, stable when measured in a similar fashion on the prior exam, possibilities include a postinflammatory cystic lesion or indolent intraductal  papillary mucinous neoplasm, surveillance in the context of the patient's ongoing oncology imaging recommended. Spleen: Small hypodense lesions are stable and likely benign. Adrenals/Urinary Tract: Stable bilateral benign-appearing cysts. These do not require specific follow up. Slightly increased right adrenal nodularity, measuring up to about 0.8 by 1.8 cm on image 55 series 2, surveillance suggested. Urinary bladder unremarkable. Stomach/Bowel: Unremarkable Vascular/Lymphatic: A left periaortic node just above the level of the left renal vein measures 1.0 cm in short axis on image 56 series 2, previously 0.9 cm. Other upper normal size retroperitoneal lymph nodes are present. Atherosclerosis is present, including aortoiliac atherosclerotic disease. Reproductive: Uterus absent.  Adnexa unremarkable. Other: No supplemental non-categorized findings. Musculoskeletal: Stable small sclerotic lesions in the left iliac bone, right upper acetabulum, and L3 vertebral body without new or progressive lesion. Grade 1 degenerative anterolisthesis at L5-S1 with mild grade 1 degenerative retrolisthesis at L2-3 and L3-4. IMPRESSION: 1. The dominant left upper lobe lesion appears mildly increased in prominence compared to 08/13/2021, although there is notable adjacent scarring/atelectasis which may confused the actual size of the lesion. In addition there is a new small left pleural effusion likely with some enhancement along the pleural surface, as well as interstitial accentuation and reticulonodular opacities in the adjacent left lower lobe which could be from atypical infection. 2. Mildly increased lower paratracheal adenopathy, currently 1.4 cm in short axis. There is also a 1.0 cm in short axis right hilar lymph node, as well as some borderline retroperitoneal adenopathy. 3. The left eccentric sclerotic lesion at the T5 vertebral level is slightly larger than on the prior exam. The other small suspected metastatic lesions  (T5 inferior vertebral body, L3, and bony pelvis) appears stable. 4. Stable 1.5 by 0.7 cm cystic lesion along the inferior margin of the pancreatic tail, possibilities include indolent intraductal papillary mucinous neoplasm or postinflammatory cystic lesion. Surveillance in the context of the patient's ongoing oncology imaging is suggested. 5. Slightly increased conspicuity of right adrenal nodularity, careful attention on follow up studies suggested. 6. Other imaging findings of potential clinical significance: Aortic Atherosclerosis (ICD10-I70.0). Mitral and aortic valve calcification. Renal cysts. Electronically Signed   By: Van Clines M.D.   On: 11/05/2021 17:18     ASSESSMENT AND PLAN: This is a very pleasant 88 years old white female recently diagnosed with a stage IV (T3, N3, M1 C) non-small cell lung cancer, adenocarcinoma with positive EGFR mutation with deletion in exon 19 diagnosed in December 2021 and presented with left lower lobe lung mass in addition to right supraclavicular, left hilar and subcarinal lymphadenopathy as well as metastatic lymphadenopathy in the abdomen and left pleural-based metastasis as well as malignant left pleural effusion and multiple brain metastasis. The molecular study showed positive EGFR mutation with deletion in exon 19. The patient started treatment with Tagrisso 80 mg p.o. daily on June 10, 2020.  Starting from October 2022 her dose has changed to 40 mg p.o. daily.  She is status post  a total of 16 months of treatment. The patient has been tolerating this treatment well with no significant adverse effect except for the mild scalp rash. She had repeat CT scan of the chest, abdomen pelvis performed recently.  I personally and independently reviewed the scan images and discussed the result and showed the images to the patient today.  Her scan showed mild increase in the prominence of the left upper lobe lung lesion in addition to mild increase in lower  paratracheal adenopathy that need close monitoring. I recommended for the patient to continue her current treatment with Tagrisso with the same dose.  I may consider increasing her dose of Tagrisso to 80 mg again if she continues to have evidence for disease progression. For the scalp rash, she will try ketoconazole 2% shampoo. The patient will come back for follow-up visit in 6 weeks for evaluation and repeat blood work. She was advised to call immediately if she has any other concerning symptoms in the interval. The patient voices understanding of current disease status and treatment options and is in agreement with the current care plan.  All questions were answered. The patient knows to call the clinic with any problems, questions or concerns. We can certainly see the patient much sooner if necessary.   Disclaimer: This note was dictated with voice recognition software. Similar sounding words can inadvertently be transcribed and may not be corrected upon review.

## 2021-11-21 ENCOUNTER — Non-Acute Institutional Stay: Payer: Medicare PPO | Admitting: Internal Medicine

## 2021-11-21 ENCOUNTER — Encounter: Payer: Self-pay | Admitting: Internal Medicine

## 2021-11-21 VITALS — BP 137/73 | HR 60 | Temp 97.9°F | Ht 64.0 in | Wt 110.1 lb

## 2021-11-21 DIAGNOSIS — I10 Essential (primary) hypertension: Secondary | ICD-10-CM | POA: Diagnosis not present

## 2021-11-21 DIAGNOSIS — C3491 Malignant neoplasm of unspecified part of right bronchus or lung: Secondary | ICD-10-CM

## 2021-11-21 DIAGNOSIS — C3492 Malignant neoplasm of unspecified part of left bronchus or lung: Secondary | ICD-10-CM

## 2021-11-21 DIAGNOSIS — E782 Mixed hyperlipidemia: Secondary | ICD-10-CM | POA: Diagnosis not present

## 2021-11-21 DIAGNOSIS — K219 Gastro-esophageal reflux disease without esophagitis: Secondary | ICD-10-CM | POA: Diagnosis not present

## 2021-11-21 DIAGNOSIS — R600 Localized edema: Secondary | ICD-10-CM

## 2021-11-21 NOTE — Progress Notes (Signed)
Location:  [facility] of Service:  Clinic (12)  Provider:   Code Status: DNR Goals of Care:     09/10/2021    3:33 PM  Advanced Directives  Does Patient Have a Medical Advance Directive? Yes  Type of Estate agent of [place];Living will  Does patient want to make changes to medical advance directive? No - Patient declined  Copy of Healthcare Power of Attorney in Chart? Yes - validated most recent copy scanned in chart (See row information)     Chief Complaint  Patient presents with   Medical Management of Chronic Issues    Patient returns to the clinic for her 6 month follow up.     HPI: Patient is a 88 y.o. female seen today for medical management of chronic diseases.    Came for Regular Follow up  Patient has a history of hypertension hyperlipidemia, lower extremity edema Patient was admitted from 12/20-12/31 for Left Pleural effusion with Hypoxia Followed by Diagnosis of Lung cancer PET scan and MRI shows Metastatic Disease on Tagrisso   On Lower Dose of Tagrisso and is doing well.  Has gained weight almost 10 pounds.  Eating better has more energy.   Recent CT scan showed  showed mild increase in the prominence of the left upper lobe lung lesion in addition to mild increase in lower paratracheal adenopathy that need close monitoring Dr. Arbutus Ped has not increased her dose yet.  He is going to see her in 6 weeks and repeat the CT in 3 months to make that decision  Patient does have lower extremity edema.  She is on Aldactone.  Does not want to do anything else at this moment. Continues to walk with her walker.  Does not drive anymore.  States independent.  Past Medical History:  Diagnosis Date   Bruit    Abdominal bruit - Abdominal aorta/renal duplex Doppler evaluation 12/07/03 -Mildly abnormal evaluation. 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 This is consistant w/median arcuate ligament compression syndrome. *Right  & Left Kidney: Essentially equal in size, symmetrical in shape w/no significant abnormalities visualized. *Right & Left Renal Arteries: No significant abnormalities.   Cancer (HCC)    Edema extremities    LE edema    H/O myocardial perfusion scan 02/20/00   To rule out ischemia - Negative adequate Bruce protocol exercise stress test with a deconditioned exercise response and normal static myocardial perfusion images with EF calculated by QGS of 77%. Represents a low risk study.   Hyperlipidemia    Hypertension    Osteoarthritis    Osteoporosis    Pleural effusion 04/2020   Valvular heart disease    Mild valvular heart disease by Echo in 2010, all mild and symptomatic, including concentric LVH, MR, TR, and AI with pulmonary artery pressure of 32. EF was normal.    Past Surgical History:  Procedure Laterality Date   Abdominal aorta/Renal duplex Doppler Evaluation  12/07/03   For abdominal bruit. Mildly abnormal evaluation. (See bruit in medical history)   BREAST EXCISIONAL BIOPSY Left 1966   BREAST EXCISIONAL BIOPSY Left 1999   CATARACT EXTRACTION  2003   CHEST TUBE INSERTION N/A 07/04/2020   Procedure: REMOVAL PLEURAL DRAINAGE CATHETER;  Surgeon: Josephine Igo, DO;  Location: [facility];  Service: Pulmonary;  Laterality: N/A;   COLONOSCOPY  10/22/2004   DEXA Bone Scan  06/23/2013   IR PERC PLEURAL DRAIN W/INDWELL CATH W/IMG GUIDE  05/10/2020   IR  THORACENTESIS ASP PLEURAL SPACE W/IMG GUIDE  05/04/2020    Allergies  Allergen Reactions   Irbesartan Nausea Only    Outpatient Encounter Medications as of 11/21/2021  Medication Sig   acetaminophen (TYLENOL) 325 MG tablet Take 2 tablets (650 mg total) by mouth every 6 (six) hours as needed for mild pain (or Fever >/= 101).   amLODipine (NORVASC) 2.5 MG tablet TAKE 1 TABLET BY MOUTH EVERY DAY   aspirin 81 MG tablet Take 81 mg by mouth daily.   atorvastatin (LIPITOR) 20 MG tablet TAKE 1 TABLET BY MOUTH EVERYDAY AT BEDTIME   Glucosamine  HCl 1000 MG TABS Take 1 tablet by mouth daily.   ketoconazole (NIZORAL) 2 % shampoo Apply topically.   loperamide (IMODIUM) 2 MG capsule Take by mouth as needed for diarrhea or loose stools.   metoprolol succinate (TOPROL-XL) 50 MG 24 hr tablet TAKE 1 TABLET BY MOUTH DAILY. TAKE WITH OR IMMEDIATELY FOLLOWING A MEAL.   Multiple Vitamin (MULTIVITAMIN) tablet Take 1 tablet by mouth daily.   osimertinib mesylate (TAGRISSO) 40 MG tablet Take 1 tablet (40 mg total) by mouth daily.   Polyethyl Glycol-Propyl Glycol (SYSTANE HYDRATION PF OP) Apply to eye.   spironolactone (ALDACTONE) 25 MG tablet Take 1 tablet (25 mg total) by mouth daily.   White Petrolatum-Mineral Oil (WH PETROL-MINERAL OIL-LANOLIN) 0.1-0.1 % OINT Apply to eye.   No facility-administered encounter medications on file as of 11/21/2021.    Review of Systems:  Review of Systems  Constitutional:  Negative for activity change and appetite change.  HENT: Negative.    Respiratory:  Negative for cough and shortness of breath.   Cardiovascular:  Negative for leg swelling.  Gastrointestinal:  Negative for constipation.  Genitourinary: Negative.   Musculoskeletal:  Negative for arthralgias, gait problem and myalgias.  Skin: Negative.   Neurological:  Positive for weakness. Negative for dizziness.  Psychiatric/Behavioral:  Negative for confusion, dysphoric mood and sleep disturbance.     Health Maintenance  Topic Date Due   Zoster Vaccines- Shingrix (1 of 2) Never done   INFLUENZA VACCINE  12/11/2021   TETANUS/TDAP  10/17/2029   Pneumonia Vaccine 56+ Years old  Completed   DEXA SCAN  Completed   COVID-19 Vaccine  Completed   HPV VACCINES  Aged Out    Physical Exam: Vitals:   11/21/21 1301  BP: 137/73  Pulse: 60  Temp: 97.9 F (36.6 C)  SpO2: 100%  Weight: 110 lb 1.6 oz (49.9 kg)  Height: 5\' 4"  (1.626 m)   Body mass index is 18.9 kg/m. Physical Exam Vitals reviewed.  Constitutional:      Appearance: Normal  appearance.  HENT:     Head: Normocephalic.     Nose: Nose normal.     Mouth/Throat:     Mouth: Mucous membranes are moist.     Pharynx: Oropharynx is clear.  Eyes:     Pupils: Pupils are equal, round, and reactive to light.  Cardiovascular:     Rate and Rhythm: Normal rate and regular rhythm.     Pulses: Normal pulses.     Heart sounds: Normal heart sounds. No murmur heard. Pulmonary:     Effort: Pulmonary effort is normal.     Breath sounds: Normal breath sounds.     Comments: Rales in Left Lower Lung Abdominal:     General: Abdomen is flat. Bowel sounds are normal.     Palpations: Abdomen is soft.  Musculoskeletal:        General: Swelling  present.     Cervical back: Neck supple.  Skin:    General: Skin is warm.  Neurological:     General: No focal deficit present.     Mental Status: She is alert and oriented to person, place, and time.  Psychiatric:        Mood and Affect: Mood normal.        Thought Content: Thought content normal.     Labs reviewed: Basic Metabolic Panel: Recent Labs    08/13/21 1126 09/26/21 0949 11/05/21 0933  NA 136 135 134*  K 4.9 4.5 4.6  CL 103 102 103  CO2 29 29 27   GLUCOSE 117* 97 102*  BUN 30* 37* 33*  CREATININE 1.24* 1.34* 1.26*  CALCIUM 10.8* 10.7* 10.8*   Liver Function Tests: Recent Labs    08/13/21 1126 09/26/21 0949 11/05/21 0933  AST 20 19 18   ALT 17 14 14   ALKPHOS 53 61 58  BILITOT 0.4 0.4 0.4  PROT 6.8 6.7 6.7  ALBUMIN 4.1 4.1 4.2   No results for input(s): "LIPASE", "AMYLASE" in the last 8760 hours. No results for input(s): "AMMONIA" in the last 8760 hours. CBC: Recent Labs    08/13/21 1126 09/26/21 0949 11/05/21 0933  WBC 6.5 7.8 8.2  NEUTROABS 5.0 5.6 6.4  HGB 12.0 12.5 12.4  HCT 37.4 37.7 36.3  MCV 94.2 92.6 91.2  PLT 187 203 218   Lipid Panel: Recent Labs    05/21/21 0800  CHOL 151  HDL 64  LDLCALC 70  TRIG 83  CHOLHDL 2.4   No results found for: "HGBA1C"  Procedures since last  visit: CT Chest W Contrast  Result Date: 11/05/2021 CLINICAL DATA:  Non-small cell lung cancer restaging * Tracking Code: BO * EXAM: CT CHEST, ABDOMEN, AND PELVIS WITH CONTRAST TECHNIQUE: Multidetector CT imaging of the chest, abdomen and pelvis was performed following the standard protocol during bolus administration of intravenous contrast. RADIATION DOSE REDUCTION: This exam was performed according to the departmental dose-optimization program which includes automated exposure control, adjustment of the mA and/or kV according to patient size and/or use of iterative reconstruction technique. CONTRAST:  80mL OMNIPAQUE IOHEXOL 300 MG/ML  SOLN COMPARISON:  Multiple exams, including 08/13/2021 FINDINGS: CT CHEST FINDINGS Cardiovascular: Mild atherosclerotic calcification of the thoracic aorta and left main coronary artery. Faint calcifications noted along the mitral and aortic valves. Mediastinum/Nodes: Right lower paratracheal node 1.4 cm in short axis on image 24 series 2, formerly 1.0 cm by my measurements. Right hilar node 1.0 cm in short axis on image 28 series 2, formerly the same by my measurements. Small bilateral axillary and subpectoral lymph nodes are present. Potential mild distal esophageal wall thickening, esophagitis would be a common cause. Lungs/Pleura: Small but increased left pleural effusion, with some enhancement along margins of the pleural effusion such that an exudative effusion is not excluded. Mild scarring medially in the right upper lobe. Scarring and volume loss in the right middle lobe. Stable left apical scarring. The dominant density in the right upper lobe measures up to 3.0 by 2.2 by 1.7 cm. By my measurements this was previously 2.1 by 2.5 by 1.5 cm, indicating some mild enlargement in the antrum. However, some of this density represents surrounding atelectasis or scarring, and there is increased bandlike scarring extending caudad from the lesion in the lingula. Interstitial  accentuation in the right lower lobe noted inferiorly with some associated volume loss for example on image 104 of series 4. There is associated airway  thickening in the left lower lobe. There is some mild confluent airspace opacity anteriorly in the right lower lobe, for example on image 78 series 4. Other minimal nodularity such as the 3 mm right lower lobe nodule on image 104 series 4 appears stable. Musculoskeletal: A sclerotic lesion inferiorly in the T5 vertebra body is present there is a sclerotic lesion in the left T5 vertebral body and pedicle. The left-sided lesion lesion measures 1.7 by 1.5 cm on image 99 series 6, previously 1.5 by 1.4 cm when measured in the same fashion. CT ABDOMEN PELVIS FINDINGS Hepatobiliary: Small hypodense hepatic lesions are stable and likely cysts. Mildly contracted gallbladder. No substantial biliary dilatation. Pancreas: 1.5 by 0.7 cm cystic lesion along the inferior margin of the pancreatic tail on image 52 series 5, stable when measured in a similar fashion on the prior exam, possibilities include a postinflammatory cystic lesion or indolent intraductal papillary mucinous neoplasm, surveillance in the context of the patient's ongoing oncology imaging recommended. Spleen: Small hypodense lesions are stable and likely benign. Adrenals/Urinary Tract: Stable bilateral benign-appearing cysts. These do not require specific follow up. Slightly increased right adrenal nodularity, measuring up to about 0.8 by 1.8 cm on image 55 series 2, surveillance suggested. Urinary bladder unremarkable. Stomach/Bowel: Unremarkable Vascular/Lymphatic: A left periaortic node just above the level of the left renal vein measures 1.0 cm in short axis on image 56 series 2, previously 0.9 cm. Other upper normal size retroperitoneal lymph nodes are present. Atherosclerosis is present, including aortoiliac atherosclerotic disease. Reproductive: Uterus absent.  Adnexa unremarkable. Other: No supplemental  non-categorized findings. Musculoskeletal: Stable small sclerotic lesions in the left iliac bone, right upper acetabulum, and L3 vertebral body without new or progressive lesion. Grade 1 degenerative anterolisthesis at L5-S1 with mild grade 1 degenerative retrolisthesis at L2-3 and L3-4. IMPRESSION: 1. The dominant left upper lobe lesion appears mildly increased in prominence compared to 08/13/2021, although there is notable adjacent scarring/atelectasis which may confused the actual size of the lesion. In addition there is a new small left pleural effusion likely with some enhancement along the pleural surface, as well as interstitial accentuation and reticulonodular opacities in the adjacent left lower lobe which could be from atypical infection. 2. Mildly increased lower paratracheal adenopathy, currently 1.4 cm in short axis. There is also a 1.0 cm in short axis right hilar lymph node, as well as some borderline retroperitoneal adenopathy. 3. The left eccentric sclerotic lesion at the T5 vertebral level is slightly larger than on the prior exam. The other small suspected metastatic lesions (T5 inferior vertebral body, L3, and bony pelvis) appears stable. 4. Stable 1.5 by 0.7 cm cystic lesion along the inferior margin of the pancreatic tail, possibilities include indolent intraductal papillary mucinous neoplasm or postinflammatory cystic lesion. Surveillance in the context of the patient's ongoing oncology imaging is suggested. 5. Slightly increased conspicuity of right adrenal nodularity, careful attention on follow up studies suggested. 6. Other imaging findings of potential clinical significance: Aortic Atherosclerosis (ICD10-I70.0). Mitral and aortic valve calcification. Renal cysts. Electronically Signed   By: Gaylyn Rong M.D.   On: 11/05/2021 17:18   CT Abdomen Pelvis W Contrast  Result Date: 11/05/2021 CLINICAL DATA:  Non-small cell lung cancer restaging * Tracking Code: BO * EXAM: CT CHEST,  ABDOMEN, AND PELVIS WITH CONTRAST TECHNIQUE: Multidetector CT imaging of the chest, abdomen and pelvis was performed following the standard protocol during bolus administration of intravenous contrast. RADIATION DOSE REDUCTION: This exam was performed according to the departmental dose-optimization  program which includes automated exposure control, adjustment of the mA and/or kV according to patient size and/or use of iterative reconstruction technique. CONTRAST:  80mL OMNIPAQUE IOHEXOL 300 MG/ML  SOLN COMPARISON:  Multiple exams, including 08/13/2021 FINDINGS: CT CHEST FINDINGS Cardiovascular: Mild atherosclerotic calcification of the thoracic aorta and left main coronary artery. Faint calcifications noted along the mitral and aortic valves. Mediastinum/Nodes: Right lower paratracheal node 1.4 cm in short axis on image 24 series 2, formerly 1.0 cm by my measurements. Right hilar node 1.0 cm in short axis on image 28 series 2, formerly the same by my measurements. Small bilateral axillary and subpectoral lymph nodes are present. Potential mild distal esophageal wall thickening, esophagitis would be a common cause. Lungs/Pleura: Small but increased left pleural effusion, with some enhancement along margins of the pleural effusion such that an exudative effusion is not excluded. Mild scarring medially in the right upper lobe. Scarring and volume loss in the right middle lobe. Stable left apical scarring. The dominant density in the right upper lobe measures up to 3.0 by 2.2 by 1.7 cm. By my measurements this was previously 2.1 by 2.5 by 1.5 cm, indicating some mild enlargement in the antrum. However, some of this density represents surrounding atelectasis or scarring, and there is increased bandlike scarring extending caudad from the lesion in the lingula. Interstitial accentuation in the right lower lobe noted inferiorly with some associated volume loss for example on image 104 of series 4. There is associated airway  thickening in the left lower lobe. There is some mild confluent airspace opacity anteriorly in the right lower lobe, for example on image 78 series 4. Other minimal nodularity such as the 3 mm right lower lobe nodule on image 104 series 4 appears stable. Musculoskeletal: A sclerotic lesion inferiorly in the T5 vertebra body is present there is a sclerotic lesion in the left T5 vertebral body and pedicle. The left-sided lesion lesion measures 1.7 by 1.5 cm on image 99 series 6, previously 1.5 by 1.4 cm when measured in the same fashion. CT ABDOMEN PELVIS FINDINGS Hepatobiliary: Small hypodense hepatic lesions are stable and likely cysts. Mildly contracted gallbladder. No substantial biliary dilatation. Pancreas: 1.5 by 0.7 cm cystic lesion along the inferior margin of the pancreatic tail on image 52 series 5, stable when measured in a similar fashion on the prior exam, possibilities include a postinflammatory cystic lesion or indolent intraductal papillary mucinous neoplasm, surveillance in the context of the patient's ongoing oncology imaging recommended. Spleen: Small hypodense lesions are stable and likely benign. Adrenals/Urinary Tract: Stable bilateral benign-appearing cysts. These do not require specific follow up. Slightly increased right adrenal nodularity, measuring up to about 0.8 by 1.8 cm on image 55 series 2, surveillance suggested. Urinary bladder unremarkable. Stomach/Bowel: Unremarkable Vascular/Lymphatic: A left periaortic node just above the level of the left renal vein measures 1.0 cm in short axis on image 56 series 2, previously 0.9 cm. Other upper normal size retroperitoneal lymph nodes are present. Atherosclerosis is present, including aortoiliac atherosclerotic disease. Reproductive: Uterus absent.  Adnexa unremarkable. Other: No supplemental non-categorized findings. Musculoskeletal: Stable small sclerotic lesions in the left iliac bone, right upper acetabulum, and L3 vertebral body without  new or progressive lesion. Grade 1 degenerative anterolisthesis at L5-S1 with mild grade 1 degenerative retrolisthesis at L2-3 and L3-4. IMPRESSION: 1. The dominant left upper lobe lesion appears mildly increased in prominence compared to 08/13/2021, although there is notable adjacent scarring/atelectasis which may confused the actual size of the lesion. In addition  there is a new small left pleural effusion likely with some enhancement along the pleural surface, as well as interstitial accentuation and reticulonodular opacities in the adjacent left lower lobe which could be from atypical infection. 2. Mildly increased lower paratracheal adenopathy, currently 1.4 cm in short axis. There is also a 1.0 cm in short axis right hilar lymph node, as well as some borderline retroperitoneal adenopathy. 3. The left eccentric sclerotic lesion at the T5 vertebral level is slightly larger than on the prior exam. The other small suspected metastatic lesions (T5 inferior vertebral body, L3, and bony pelvis) appears stable. 4. Stable 1.5 by 0.7 cm cystic lesion along the inferior margin of the pancreatic tail, possibilities include indolent intraductal papillary mucinous neoplasm or postinflammatory cystic lesion. Surveillance in the context of the patient's ongoing oncology imaging is suggested. 5. Slightly increased conspicuity of right adrenal nodularity, careful attention on follow up studies suggested. 6. Other imaging findings of potential clinical significance: Aortic Atherosclerosis (ICD10-I70.0). Mitral and aortic valve calcification. Renal cysts. Electronically Signed   By: Gaylyn Rong M.D.   On: 11/05/2021 17:18    Assessment/Plan 1. Essential hypertension Amlodipine Toprol  BP good Follows with Cardiology also  2. Malignant neoplasm of both lungs (HCC) Doing well with Lower dose of Tagrisso Follows closely with Oncology Has gained weight also  3. Mixed hyperlipidemia On Low dose of statin  4.  Gastroesophageal reflux disease, unspecified whether esophagitis present Not on any Meds She says she is fine right now  5. Bilateral leg edema On Aldactone 6 CKD Creat stabe   Labs/tests ordered:  * No order type specified * Next appt:  Visit date not found

## 2021-11-23 ENCOUNTER — Telehealth: Payer: Self-pay | Admitting: Internal Medicine

## 2021-11-23 NOTE — Telephone Encounter (Signed)
Called patient regarding upcoming September appointments, patient has been called and notified.

## 2021-11-30 ENCOUNTER — Other Ambulatory Visit (HOSPITAL_COMMUNITY): Payer: Self-pay

## 2021-12-03 ENCOUNTER — Other Ambulatory Visit: Payer: Self-pay

## 2021-12-06 ENCOUNTER — Other Ambulatory Visit (HOSPITAL_COMMUNITY): Payer: Self-pay

## 2021-12-07 DIAGNOSIS — Z961 Presence of intraocular lens: Secondary | ICD-10-CM | POA: Diagnosis not present

## 2021-12-07 DIAGNOSIS — H524 Presbyopia: Secondary | ICD-10-CM | POA: Diagnosis not present

## 2021-12-19 ENCOUNTER — Other Ambulatory Visit: Payer: Self-pay

## 2021-12-19 ENCOUNTER — Inpatient Hospital Stay (HOSPITAL_BASED_OUTPATIENT_CLINIC_OR_DEPARTMENT_OTHER): Payer: Medicare PPO | Admitting: Internal Medicine

## 2021-12-19 ENCOUNTER — Inpatient Hospital Stay: Payer: Medicare PPO | Attending: Physician Assistant

## 2021-12-19 ENCOUNTER — Encounter: Payer: Self-pay | Admitting: Internal Medicine

## 2021-12-19 VITALS — BP 139/69 | HR 77 | Temp 98.0°F | Resp 15 | Wt 110.1 lb

## 2021-12-19 DIAGNOSIS — C3492 Malignant neoplasm of unspecified part of left bronchus or lung: Secondary | ICD-10-CM

## 2021-12-19 DIAGNOSIS — R21 Rash and other nonspecific skin eruption: Secondary | ICD-10-CM | POA: Diagnosis not present

## 2021-12-19 DIAGNOSIS — C782 Secondary malignant neoplasm of pleura: Secondary | ICD-10-CM | POA: Diagnosis not present

## 2021-12-19 DIAGNOSIS — C772 Secondary and unspecified malignant neoplasm of intra-abdominal lymph nodes: Secondary | ICD-10-CM

## 2021-12-19 DIAGNOSIS — R5383 Other fatigue: Secondary | ICD-10-CM | POA: Diagnosis not present

## 2021-12-19 DIAGNOSIS — Z5111 Encounter for antineoplastic chemotherapy: Secondary | ICD-10-CM

## 2021-12-19 DIAGNOSIS — C7931 Secondary malignant neoplasm of brain: Secondary | ICD-10-CM | POA: Diagnosis not present

## 2021-12-19 DIAGNOSIS — Z79899 Other long term (current) drug therapy: Secondary | ICD-10-CM | POA: Insufficient documentation

## 2021-12-19 DIAGNOSIS — J910 Malignant pleural effusion: Secondary | ICD-10-CM | POA: Diagnosis not present

## 2021-12-19 DIAGNOSIS — C3490 Malignant neoplasm of unspecified part of unspecified bronchus or lung: Secondary | ICD-10-CM | POA: Diagnosis not present

## 2021-12-19 DIAGNOSIS — C3412 Malignant neoplasm of upper lobe, left bronchus or lung: Secondary | ICD-10-CM | POA: Diagnosis not present

## 2021-12-19 LAB — CMP (CANCER CENTER ONLY)
ALT: 16 U/L (ref 0–44)
AST: 21 U/L (ref 15–41)
Albumin: 4.1 g/dL (ref 3.5–5.0)
Alkaline Phosphatase: 62 U/L (ref 38–126)
Anion gap: 6 (ref 5–15)
BUN: 30 mg/dL — ABNORMAL HIGH (ref 8–23)
CO2: 25 mmol/L (ref 22–32)
Calcium: 10.5 mg/dL — ABNORMAL HIGH (ref 8.9–10.3)
Chloride: 102 mmol/L (ref 98–111)
Creatinine: 1.22 mg/dL — ABNORMAL HIGH (ref 0.44–1.00)
GFR, Estimated: 43 mL/min — ABNORMAL LOW (ref >60)
Glucose, Bld: 100 mg/dL — ABNORMAL HIGH (ref 70–99)
Potassium: 4.6 mmol/L (ref 3.5–5.1)
Sodium: 133 mmol/L — ABNORMAL LOW (ref 135–145)
Total Bilirubin: 0.6 mg/dL (ref 0.3–1.2)
Total Protein: 6.8 g/dL (ref 6.5–8.1)

## 2021-12-19 LAB — CBC WITH DIFFERENTIAL (CANCER CENTER ONLY)
Abs Immature Granulocytes: 0.02 10*3/uL (ref 0.00–0.07)
Basophils Absolute: 0 10*3/uL (ref 0.0–0.1)
Basophils Relative: 0 %
Eosinophils Absolute: 0.2 10*3/uL (ref 0.0–0.5)
Eosinophils Relative: 3 %
HCT: 38.1 % (ref 36.0–46.0)
Hemoglobin: 13.2 g/dL (ref 12.0–15.0)
Immature Granulocytes: 0 %
Lymphocytes Relative: 9 %
Lymphs Abs: 0.8 10*3/uL (ref 0.7–4.0)
MCH: 31.4 pg (ref 26.0–34.0)
MCHC: 34.6 g/dL (ref 30.0–36.0)
MCV: 90.7 fL (ref 80.0–100.0)
Monocytes Absolute: 0.7 10*3/uL (ref 0.1–1.0)
Monocytes Relative: 8 %
Neutro Abs: 6.8 10*3/uL (ref 1.7–7.7)
Neutrophils Relative %: 80 %
Platelet Count: 252 10*3/uL (ref 150–400)
RBC: 4.2 MIL/uL (ref 3.87–5.11)
RDW: 14.6 % (ref 11.5–15.5)
WBC Count: 8.6 10*3/uL (ref 4.0–10.5)
nRBC: 0 % (ref 0.0–0.2)

## 2021-12-19 NOTE — Progress Notes (Signed)
[facility] [phone]   [phone]  OFFICE PROGRESS NOTE  [name], MD [place] [address]  DIAGNOSIS: Stage IV (T2b, N2, M1c), non-small cell lung cancer, adenocarcinoma.  The patient presented with a left upper lobe mass, right supra clavicular, left hilar and subcarinal lymphadenopathy as well as diffuse left-sided pleural disease with associated malignant left pleural effusion and 2 small lymph nodes near the SMA in addition to multiple brain metastasis diagnosed in December 2021.   Molecular studies by [employer] showed positive EGFR mutation with deletion in exon 19  PRIOR THERAPY: None  CURRENT THERAPY: Tagrisso 80 mg p.o. daily. First dose June 10, 2020.  Status post 17.5 months of treatment.  Starting October 2022 the patient is treated with Tagrisso 40 mg p.o. daily because of the intolerance.  INTERVAL HISTORY: Shelley Flores 88 y.o. female returns to the clinic today for follow-up visit.  The patient has no complaints today.  She denied having any current chest pain, shortness of breath, cough or hemoptysis.  She has no nausea, vomiting, diarrhea or constipation.  She denied having any headache or visual changes.  She denied having any weight loss or night sweats.  She continues to tolerate her treatment with Tagrisso fairly well.  The patient is here today for evaluation and repeat blood work.  MEDICAL HISTORY: Past Medical History:  Diagnosis Date   Bruit    Abdominal bruit - Abdominal aorta/renal duplex Doppler evaluation 12/07/03 -Mildly abnormal evaluation. 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 This is consistant w/median arcuate ligament compression syndrome. *Right & Left Kidney: Essentially equal in size, symmetrical in shape w/no significant abnormalities visualized. *Right & Left Renal Arteries: No significant abnormalities.   Cancer ([place])    Edema extremities    LE edema     H/O myocardial perfusion scan 02/20/00   To rule out ischemia - Negative adequate Bruce protocol exercise stress test with a deconditioned exercise response and normal static myocardial perfusion images with EF calculated by QGS of 77%. Represents a low risk study.   Hyperlipidemia    Hypertension    Osteoarthritis    Osteoporosis    Pleural effusion 04/2020   Valvular heart disease    Mild valvular heart disease by Echo in 2010, all mild and symptomatic, including concentric LVH, MR, TR, and AI with pulmonary artery pressure of 32. EF was normal.    ALLERGIES:  is allergic to irbesartan.  MEDICATIONS:  Current Outpatient Medications  Medication Sig Dispense Refill   acetaminophen (TYLENOL) 325 MG tablet Take 2 tablets (650 mg total) by mouth every 6 (six) hours as needed for mild pain (or Fever >/= 101). 20 tablet 0   amLODipine (NORVASC) 2.5 MG tablet TAKE 1 TABLET BY MOUTH EVERY DAY 90 tablet 1   aspirin 81 MG tablet Take 81 mg by mouth daily.     atorvastatin (LIPITOR) 20 MG tablet TAKE 1 TABLET BY MOUTH EVERYDAY AT BEDTIME 90 tablet 3   Glucosamine HCl 1000 MG TABS Take 1 tablet by mouth daily.     ketoconazole (NIZORAL) 2 % shampoo Apply topically.     loperamide (IMODIUM) 2 MG capsule Take by mouth as needed for diarrhea or loose stools.     metoprolol succinate (TOPROL-XL) 50 MG 24 hr tablet TAKE 1 TABLET BY MOUTH DAILY. TAKE WITH OR IMMEDIATELY FOLLOWING A MEAL. 90 tablet 3   Multiple Vitamin (MULTIVITAMIN) tablet Take 1  tablet by mouth daily.     osimertinib mesylate (TAGRISSO) 40 MG tablet Take 1 tablet (40 mg total) by mouth daily. 30 tablet 3   Polyethyl Glycol-Propyl Glycol (SYSTANE HYDRATION PF OP) Apply to eye.     White Petrolatum-Mineral Oil ([place] PETROL-MINERAL OIL-LANOLIN) 0.1-0.1 % OINT Apply to eye.     spironolactone (ALDACTONE) 25 MG tablet Take 1 tablet (25 mg total) by mouth daily. 90 tablet 3   No current facility-administered medications for this visit.     SURGICAL HISTORY:  Past Surgical History:  Procedure Laterality Date   Abdominal aorta/Renal duplex Doppler Evaluation  12/07/03   For abdominal bruit. Mildly abnormal evaluation. (See bruit in medical history)   BREAST EXCISIONAL BIOPSY Left 1966   BREAST EXCISIONAL BIOPSY Left 1999   CATARACT EXTRACTION  2003   CHEST TUBE INSERTION N/A 07/04/2020   Procedure: REMOVAL PLEURAL DRAINAGE CATHETER;  Surgeon: Garner Nash, DO;  Location: [facility];  Service: Pulmonary;  Laterality: N/A;   COLONOSCOPY  10/22/2004   DEXA Bone Scan  06/23/2013   IR PERC PLEURAL DRAIN W/INDWELL CATH W/IMG GUIDE  05/10/2020   IR THORACENTESIS ASP PLEURAL SPACE W/IMG GUIDE  05/04/2020    REVIEW OF SYSTEMS:  A comprehensive review of systems was negative except for: Constitutional: positive for fatigue   PHYSICAL EXAMINATION: General appearance: alert, cooperative, and no distress Head: Normocephalic, without obvious abnormality, atraumatic Neck: no adenopathy, no JVD, supple, symmetrical, trachea midline, and thyroid not enlarged, symmetric, no tenderness/mass/nodules Lymph nodes: Cervical, supraclavicular, and axillary nodes normal. Resp: clear to auscultation bilaterally Back: symmetric, no curvature. ROM normal. No CVA tenderness. Cardio: regular rate and rhythm, S1, S2 normal, no murmur, click, rub or gallop GI: soft, non-tender; bowel sounds normal; no masses,  no organomegaly Extremities: extremities normal, atraumatic, no cyanosis or edema  ECOG PERFORMANCE STATUS: 1 - Symptomatic but completely ambulatory  Blood pressure 139/69, pulse 77, temperature 98 F (36.7 C), temperature source Oral, resp. rate 15, weight 110 lb 1.6 oz (49.9 kg), SpO2 97 %.  LABORATORY DATA: Lab Results  Component Value Date   WBC 8.6 12/19/2021   HGB 13.2 12/19/2021   HCT 38.1 12/19/2021   MCV 90.7 12/19/2021   PLT 252 12/19/2021      Chemistry      Component Value Date/Time   NA 134 (L) 11/05/2021 0933    NA 137 05/25/2020 0000   K 4.6 11/05/2021 0933   CL 103 11/05/2021 0933   CO2 27 11/05/2021 0933   BUN 33 (H) 11/05/2021 0933   BUN 12 05/25/2020 0000   CREATININE 1.26 (H) 11/05/2021 0933   CREATININE 1.22 (H) 05/21/2021 0800   GLU 83 05/25/2020 0000      Component Value Date/Time   CALCIUM 10.8 (H) 11/05/2021 0933   ALKPHOS 58 11/05/2021 0933   AST 18 11/05/2021 0933   ALT 14 11/05/2021 0933   BILITOT 0.4 11/05/2021 0933       RADIOGRAPHIC STUDIES: No results found.   ASSESSMENT AND PLAN: This is a very pleasant 88 years old white female recently diagnosed with a stage IV (T3, N3, M1 C) non-small cell lung cancer, adenocarcinoma with positive EGFR mutation with deletion in exon 19 diagnosed in December 2021 and presented with left lower lobe lung mass in addition to right supraclavicular, left hilar and subcarinal lymphadenopathy as well as metastatic lymphadenopathy in the abdomen and left pleural-based metastasis as well as malignant left pleural effusion and multiple brain metastasis. The molecular study  showed positive EGFR mutation with deletion in exon 19. The patient started treatment with Tagrisso 80 mg p.o. daily on June 10, 2020.  Starting from October 2022 her dose has changed to 40 mg p.o. daily.  She is status post a total of 17.5 months of treatment. The patient continues to tolerate this treatment well with no concerning adverse effects. I recommended for her to continue her current treatment with Tagrisso 40 mg p.o. daily for now. I will see her back for follow-up visit in 6 weeks for evaluation with repeat CT scan of the chest, abdomen and pelvis for restaging of her disease. For the scalp rash, she will try ketoconazole 2% shampoo. The patient was advised to call immediately if she has any other concerning symptoms in the interval. The patient voices understanding of current disease status and treatment options and is in agreement with the current care  plan.  All questions were answered. The patient knows to call the clinic with any problems, questions or concerns. We can certainly see the patient much sooner if necessary.   Disclaimer: This note was dictated with voice recognition software. Similar sounding words can inadvertently be transcribed and may not be corrected upon review.

## 2021-12-20 ENCOUNTER — Telehealth: Payer: Self-pay | Admitting: Internal Medicine

## 2021-12-20 NOTE — Telephone Encounter (Signed)
Scheduled per 08/08 los, patient has been called and notified.

## 2021-12-31 ENCOUNTER — Other Ambulatory Visit (HOSPITAL_COMMUNITY): Payer: Self-pay

## 2022-01-01 ENCOUNTER — Other Ambulatory Visit (HOSPITAL_COMMUNITY): Payer: Self-pay

## 2022-01-03 ENCOUNTER — Telehealth: Payer: Self-pay | Admitting: Physician Assistant

## 2022-01-03 NOTE — Telephone Encounter (Signed)
Rescheduled 09/21 appointment due to provider pal, called to inform patient of new 09/20 appointment.

## 2022-01-28 ENCOUNTER — Other Ambulatory Visit: Payer: Self-pay

## 2022-01-28 ENCOUNTER — Inpatient Hospital Stay: Payer: Medicare PPO | Attending: Physician Assistant

## 2022-01-28 ENCOUNTER — Ambulatory Visit (HOSPITAL_COMMUNITY)
Admission: RE | Admit: 2022-01-28 | Discharge: 2022-01-28 | Disposition: A | Discharge status: Home / Self Care | Payer: Medicare PPO | Source: Ambulatory Visit | Attending: Internal Medicine | Admitting: Internal Medicine

## 2022-01-28 DIAGNOSIS — C7931 Secondary malignant neoplasm of brain: Secondary | ICD-10-CM | POA: Diagnosis not present

## 2022-01-28 DIAGNOSIS — C3490 Malignant neoplasm of unspecified part of unspecified bronchus or lung: Secondary | ICD-10-CM

## 2022-01-28 DIAGNOSIS — R590 Localized enlarged lymph nodes: Secondary | ICD-10-CM | POA: Insufficient documentation

## 2022-01-28 DIAGNOSIS — N281 Cyst of kidney, acquired: Secondary | ICD-10-CM | POA: Diagnosis not present

## 2022-01-28 DIAGNOSIS — C3432 Malignant neoplasm of lower lobe, left bronchus or lung: Secondary | ICD-10-CM | POA: Insufficient documentation

## 2022-01-28 DIAGNOSIS — J90 Pleural effusion, not elsewhere classified: Secondary | ICD-10-CM | POA: Diagnosis not present

## 2022-01-28 DIAGNOSIS — C782 Secondary malignant neoplasm of pleura: Secondary | ICD-10-CM | POA: Insufficient documentation

## 2022-01-28 DIAGNOSIS — Z79899 Other long term (current) drug therapy: Secondary | ICD-10-CM | POA: Insufficient documentation

## 2022-01-28 DIAGNOSIS — C778 Secondary and unspecified malignant neoplasm of lymph nodes of multiple regions: Secondary | ICD-10-CM | POA: Insufficient documentation

## 2022-01-28 LAB — CBC WITH DIFFERENTIAL (CANCER CENTER ONLY)
Abs Immature Granulocytes: 0.06 10*3/uL (ref 0.00–0.07)
Basophils Absolute: 0.1 10*3/uL (ref 0.0–0.1)
Basophils Relative: 1 %
Eosinophils Absolute: 0.3 10*3/uL (ref 0.0–0.5)
Eosinophils Relative: 3 %
HCT: 41.9 % (ref 36.0–46.0)
Hemoglobin: 14.3 g/dL (ref 12.0–15.0)
Immature Granulocytes: 1 %
Lymphocytes Relative: 7 %
Lymphs Abs: 0.8 10*3/uL (ref 0.7–4.0)
MCH: 31 pg (ref 26.0–34.0)
MCHC: 34.1 g/dL (ref 30.0–36.0)
MCV: 90.9 fL (ref 80.0–100.0)
Monocytes Absolute: 1 10*3/uL (ref 0.1–1.0)
Monocytes Relative: 10 %
Neutro Abs: 8.3 10*3/uL — ABNORMAL HIGH (ref 1.7–7.7)
Neutrophils Relative %: 78 %
Platelet Count: 295 10*3/uL (ref 150–400)
RBC: 4.61 MIL/uL (ref 3.87–5.11)
RDW: 14.8 % (ref 11.5–15.5)
WBC Count: 10.4 10*3/uL (ref 4.0–10.5)
nRBC: 0 % (ref 0.0–0.2)

## 2022-01-28 LAB — CMP (CANCER CENTER ONLY)
ALT: 13 U/L (ref 0–44)
AST: 22 U/L (ref 15–41)
Albumin: 4.1 g/dL (ref 3.5–5.0)
Alkaline Phosphatase: 68 U/L (ref 38–126)
Anion gap: 8 (ref 5–15)
BUN: 23 mg/dL (ref 8–23)
CO2: 23 mmol/L (ref 22–32)
Calcium: 10.5 mg/dL — ABNORMAL HIGH (ref 8.9–10.3)
Chloride: 102 mmol/L (ref 98–111)
Creatinine: 1.21 mg/dL — ABNORMAL HIGH (ref 0.44–1.00)
GFR, Estimated: 43 mL/min — ABNORMAL LOW (ref >60)
Glucose, Bld: 116 mg/dL — ABNORMAL HIGH (ref 70–99)
Potassium: 4.4 mmol/L (ref 3.5–5.1)
Sodium: 133 mmol/L — ABNORMAL LOW (ref 135–145)
Total Bilirubin: 0.5 mg/dL (ref 0.3–1.2)
Total Protein: 6.8 g/dL (ref 6.5–8.1)

## 2022-01-28 MED ORDER — IOHEXOL 300 MG/ML  SOLN
75.0000 mL | Freq: Once | INTRAMUSCULAR | Status: AC | PRN
  Administered 2022-01-28: 75 mL via INTRAVENOUS

## 2022-01-28 NOTE — Progress Notes (Unsigned)
[facility] OFFICE PROGRESS NOTE  Shelley Dad, MD [place] [address]  DIAGNOSIS: Stage IV (T2b, N2, M1c), non-small cell lung cancer, adenocarcinoma.  The patient presented with a left upper lobe mass, right supra clavicular, left hilar and subcarinal lymphadenopathy as well as diffuse left-sided pleural disease with associated malignant left pleural effusion and 2 small lymph nodes near the SMA in addition to multiple brain metastasis diagnosed in December 2021.    Molecular studies by [employer] showed positive EGFR mutation with deletion in exon 19  PRIOR THERAPY: None  CURRENT THERAPY:  Tagrisso 80 mg p.o. daily. First dose June 10, 2020.  Status post 17.5 months of treatment.  Starting October 2022 the patient is treated with Tagrisso 40 mg p.o. daily because of the intolerance.  INTERVAL HISTORY: Shelley Flores 88 y.o. female returns to the clinic for a follow up visit. The patient is feeling well today without any concerning complaints. The patient continues to tolerate targetted treatment with Tagrisso well without any adverse effects. Denies any fever, chills, night sweats, or weight loss. Denies any chest pain, shortness of breath, cough, or hemoptysis. Denies any nausea, vomiting, diarrhea, or constipation. Denies any headache or visual changes. Denies any rashes or skin changes. The patient recently had a restaging CT scan. The patient is here today for evaluation and to review her scan results.    MEDICAL HISTORY: Past Medical History:  Diagnosis Date   Bruit    Abdominal bruit - Abdominal aorta/renal duplex Doppler evaluation 12/07/03 -Mildly abnormal evaluation. 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 This is consistant w/median arcuate ligament compression syndrome. *Right & Left Kidney: Essentially equal in size, symmetrical in shape w/no significant abnormalities visualized. *Right & Left Renal Arteries: No  significant abnormalities.   Cancer ([place])    Edema extremities    LE edema    H/O myocardial perfusion scan 02/20/00   To rule out ischemia - Negative adequate Bruce protocol exercise stress test with a deconditioned exercise response and normal static myocardial perfusion images with EF calculated by QGS of 77%. Represents a low risk study.   Hyperlipidemia    Hypertension    Osteoarthritis    Osteoporosis    Pleural effusion 04/2020   Valvular heart disease    Mild valvular heart disease by Echo in 2010, all mild and symptomatic, including concentric LVH, MR, TR, and AI with pulmonary artery pressure of 32. EF was normal.    ALLERGIES:  is allergic to irbesartan.  MEDICATIONS:  Current Outpatient Medications  Medication Sig Dispense Refill   acetaminophen (TYLENOL) 325 MG tablet Take 2 tablets (650 mg total) by mouth every 6 (six) hours as needed for mild pain (or Fever >/= 101). 20 tablet 0   amLODipine (NORVASC) 2.5 MG tablet TAKE 1 TABLET BY MOUTH EVERY DAY 90 tablet 1   aspirin 81 MG tablet Take 81 mg by mouth daily.     atorvastatin (LIPITOR) 20 MG tablet TAKE 1 TABLET BY MOUTH EVERYDAY AT BEDTIME 90 tablet 3   Glucosamine HCl 1000 MG TABS Take 1 tablet by mouth daily.     ketoconazole (NIZORAL) 2 % shampoo Apply topically.     loperamide (IMODIUM) 2 MG capsule Take by mouth as needed for diarrhea or loose stools.     metoprolol succinate (TOPROL-XL) 50 MG 24 hr tablet TAKE 1 TABLET BY MOUTH DAILY. TAKE WITH OR IMMEDIATELY FOLLOWING A MEAL. 90 tablet 3   Multiple  Vitamin (MULTIVITAMIN) tablet Take 1 tablet by mouth daily.     osimertinib mesylate (TAGRISSO) 40 MG tablet Take 1 tablet (40 mg total) by mouth daily. 30 tablet 3   Polyethyl Glycol-Propyl Glycol (SYSTANE HYDRATION PF OP) Apply to eye.     spironolactone (ALDACTONE) 25 MG tablet Take 1 tablet (25 mg total) by mouth daily. 90 tablet 3   White Petrolatum-Mineral Oil ([place] PETROL-MINERAL OIL-LANOLIN) 0.1-0.1 % OINT Apply  to eye.     No current facility-administered medications for this visit.    SURGICAL HISTORY:  Past Surgical History:  Procedure Laterality Date   Abdominal aorta/Renal duplex Doppler Evaluation  12/07/03   For abdominal bruit. Mildly abnormal evaluation. (See bruit in medical history)   BREAST EXCISIONAL BIOPSY Left 1966   BREAST EXCISIONAL BIOPSY Left 1999   CATARACT EXTRACTION  2003   CHEST TUBE INSERTION N/A 07/04/2020   Procedure: REMOVAL PLEURAL DRAINAGE CATHETER;  Surgeon: Garner Nash, DO;  Location: [facility];  Service: Pulmonary;  Laterality: N/A;   COLONOSCOPY  10/22/2004   DEXA Bone Scan  06/23/2013   IR PERC PLEURAL DRAIN W/INDWELL CATH W/IMG GUIDE  05/10/2020   IR THORACENTESIS ASP PLEURAL SPACE W/IMG GUIDE  05/04/2020    REVIEW OF SYSTEMS:   Review of Systems  Constitutional: Negative for appetite change, chills, fatigue, fever and unexpected weight change.  HENT:   Negative for mouth sores, nosebleeds, sore throat and trouble swallowing.   Eyes: Negative for eye problems and icterus.  Respiratory: Negative for cough, hemoptysis, shortness of breath and wheezing.   Cardiovascular: Negative for chest pain and leg swelling.  Gastrointestinal: Negative for abdominal pain, constipation, diarrhea, nausea and vomiting.  Genitourinary: Negative for bladder incontinence, difficulty urinating, dysuria, frequency and hematuria.   Musculoskeletal: Negative for back pain, gait problem, neck pain and neck stiffness.  Skin: Negative for itching and rash.  Neurological: Negative for dizziness, extremity weakness, gait problem, headaches, light-headedness and seizures.  Hematological: Negative for adenopathy. Does not bruise/bleed easily.  Psychiatric/Behavioral: Negative for confusion, depression and sleep disturbance. The patient is not nervous/anxious.     PHYSICAL EXAMINATION:  There were no vitals taken for this visit.  ECOG PERFORMANCE STATUS: {CHL ONC ECOG  [id number]  Physical Exam  Constitutional: Oriented to person, place, and time and well-developed, well-nourished, and in no distress. No distress.  HENT:  Head: Normocephalic and atraumatic.  Mouth/Throat: Oropharynx is clear and moist. No oropharyngeal exudate.  Eyes: Conjunctivae are normal. Right eye exhibits no discharge. Left eye exhibits no discharge. No scleral icterus.  Neck: Normal range of motion. Neck supple.  Cardiovascular: Normal rate, regular rhythm, normal heart sounds and intact distal pulses.   Pulmonary/Chest: Effort normal and breath sounds normal. No respiratory distress. No wheezes. No rales.  Abdominal: Soft. Bowel sounds are normal. Exhibits no distension and no mass. There is no tenderness.  Musculoskeletal: Normal range of motion. Exhibits no edema.  Lymphadenopathy:    No cervical adenopathy.  Neurological: Alert and oriented to person, place, and time. Exhibits normal muscle tone. Gait normal. Coordination normal.  Skin: Skin is warm and dry. No rash noted. Not diaphoretic. No erythema. No pallor.  Psychiatric: Mood, memory and judgment normal.  Vitals reviewed.  LABORATORY DATA: Lab Results  Component Value Date   WBC 10.4 01/28/2022   HGB 14.3 01/28/2022   HCT 41.9 01/28/2022   MCV 90.9 01/28/2022   PLT 295 01/28/2022      Chemistry      Component Value Date/Time  NA 133 (L) 01/28/2022 0949   NA 137 05/25/2020 0000   K 4.4 01/28/2022 0949   CL 102 01/28/2022 0949   CO2 23 01/28/2022 0949   BUN 23 01/28/2022 0949   BUN 12 05/25/2020 0000   CREATININE 1.21 (H) 01/28/2022 0949   CREATININE 1.22 (H) 05/21/2021 0800   GLU 83 05/25/2020 0000      Component Value Date/Time   CALCIUM 10.5 (H) 01/28/2022 0949   ALKPHOS 68 01/28/2022 0949   AST 22 01/28/2022 0949   ALT 13 01/28/2022 0949   BILITOT 0.5 01/28/2022 0949       RADIOGRAPHIC STUDIES:  No results found.   ASSESSMENT/PLAN:  This is a very pleasant 88 years old Caucasian  female recently diagnosed with a stage IV (T3, N3, M1 C) non-small cell lung cancer, adenocarcinoma with positive EGFR mutation with deletion in exon 19 diagnosed in December 2021. She presented with with a left lower lobe lung mass in addition to right supraclavicular, left hilar, and subcarinal lymphadenopathy. She also had metastatic adenopathy in the abdomen and left pleural based metastasis, and a malignant left pleural effusion and multiple brain metastasis.   The patient started treatment with Tagrisso 80 mg p.o. daily on June 10, 2020.  Starting from October 2022 her dose has changed to 40 mg p.o. daily.  She is status post a total of 17.5 months of treatment.  The patient recently had a restaging CT scan. Dr. Julien Nordmann personally and independently reviewed the scan and discussed the results with the patient. The scan showed ***.   Dr. Julien Nordmann recommends that she continue on the same treatment at the same dose.   We will see her back for a follow up visit in 6 weeks for evaluation and repeat blood work.   The patient was advised to call immediately if she has any concerning symptoms in the interval. The patient voices understanding of current disease status and treatment options and is in agreement with the current care plan. All questions were answered. The patient knows to call the clinic with any problems, questions or concerns. We can certainly see the patient much sooner if necessary        No orders of the defined types were placed in this encounter.    I spent {CHL ONC TIME VISIT - IOMBT:[id number]} counseling the patient face to face. The total time spent in the appointment was {CHL ONC TIME VISIT - XMIWO:[id number]}.  Alif Petrak L Dannica Bickham, PA-C 01/28/22

## 2022-01-29 ENCOUNTER — Other Ambulatory Visit (HOSPITAL_COMMUNITY): Payer: Self-pay

## 2022-01-29 ENCOUNTER — Other Ambulatory Visit: Payer: Self-pay | Admitting: Internal Medicine

## 2022-01-29 DIAGNOSIS — C3492 Malignant neoplasm of unspecified part of left bronchus or lung: Secondary | ICD-10-CM

## 2022-01-30 ENCOUNTER — Other Ambulatory Visit (HOSPITAL_COMMUNITY): Payer: Self-pay

## 2022-01-30 ENCOUNTER — Encounter: Payer: Self-pay | Admitting: Physician Assistant

## 2022-01-30 ENCOUNTER — Inpatient Hospital Stay: Payer: Medicare PPO

## 2022-01-30 ENCOUNTER — Ambulatory Visit: Payer: Medicare PPO | Admitting: Internal Medicine

## 2022-01-30 ENCOUNTER — Inpatient Hospital Stay: Payer: Medicare PPO | Admitting: Physician Assistant

## 2022-01-30 ENCOUNTER — Other Ambulatory Visit: Payer: Self-pay

## 2022-01-30 VITALS — BP 132/61 | HR 95 | Temp 97.9°F | Resp 18 | Ht 64.0 in | Wt 105.9 lb

## 2022-01-30 DIAGNOSIS — Z7189 Other specified counseling: Secondary | ICD-10-CM | POA: Diagnosis not present

## 2022-01-30 DIAGNOSIS — C3492 Malignant neoplasm of unspecified part of left bronchus or lung: Secondary | ICD-10-CM

## 2022-01-30 DIAGNOSIS — C782 Secondary malignant neoplasm of pleura: Secondary | ICD-10-CM | POA: Diagnosis not present

## 2022-01-30 DIAGNOSIS — Z79899 Other long term (current) drug therapy: Secondary | ICD-10-CM | POA: Diagnosis not present

## 2022-01-30 DIAGNOSIS — R590 Localized enlarged lymph nodes: Secondary | ICD-10-CM | POA: Diagnosis not present

## 2022-01-30 DIAGNOSIS — C3432 Malignant neoplasm of lower lobe, left bronchus or lung: Secondary | ICD-10-CM | POA: Diagnosis not present

## 2022-01-30 DIAGNOSIS — C778 Secondary and unspecified malignant neoplasm of lymph nodes of multiple regions: Secondary | ICD-10-CM | POA: Diagnosis not present

## 2022-01-31 ENCOUNTER — Ambulatory Visit: Payer: Medicare PPO | Admitting: Internal Medicine

## 2022-02-07 DIAGNOSIS — C3492 Malignant neoplasm of unspecified part of left bronchus or lung: Secondary | ICD-10-CM | POA: Diagnosis not present

## 2022-02-08 ENCOUNTER — Other Ambulatory Visit (HOSPITAL_COMMUNITY): Payer: Self-pay

## 2022-02-08 ENCOUNTER — Other Ambulatory Visit: Payer: Self-pay | Admitting: Physician Assistant

## 2022-02-08 ENCOUNTER — Encounter: Payer: Self-pay | Admitting: Internal Medicine

## 2022-02-08 DIAGNOSIS — C3492 Malignant neoplasm of unspecified part of left bronchus or lung: Secondary | ICD-10-CM

## 2022-02-08 LAB — GUARDANT 360

## 2022-02-08 MED ORDER — OSIMERTINIB MESYLATE 40 MG PO TABS
40.0000 mg | ORAL_TABLET | Freq: Every day | ORAL | 2 refills | Status: DC
  Filled 2022-02-08: qty 30, 30d supply, fill #0

## 2022-02-11 ENCOUNTER — Other Ambulatory Visit (HOSPITAL_COMMUNITY): Payer: Self-pay

## 2022-02-11 NOTE — Progress Notes (Unsigned)
[facility] OFFICE PROGRESS NOTE  [name], MD [place] [id number]  DIAGNOSIS: Stage IV (T2b, N2, M1c), non-small cell lung cancer, adenocarcinoma.  The patient presented with a left upper lobe mass, right supra clavicular, left hilar and subcarinal lymphadenopathy as well as diffuse left-sided pleural disease with associated malignant left pleural effusion and 2 small lymph nodes near the SMA in addition to multiple brain metastasis diagnosed in December 2021.    Molecular studies by [employer] showed positive EGFR mutation with deletion in exon 19  PRIOR THERAPY: None  CURRENT THERAPY:  1) palliative systemic chemotherapy with carboplatin for an AUC of 5 Alimta 500 mg per metered squared, and Avastin IV every 3 weeks.  First dose expected on 10/11/thousand 23. 2) Tagrisso 80 mg p.o. daily. First dose June 10, 2020.  Status post 17.5 months of treatment.  Starting October 2022 the patient is treated with Tagrisso 40 mg p.o. daily because of the intolerance. The patient had progression in September 2023 but continues to take this due to hx of metastatic disease to the brain  INTERVAL HISTORY: Shelley Flores 88 y.o. female returns to clinic today for follow-up visit.  The patient was last seen in the clinic on 9/20/thousand 23.  At that point time, she had a repeat restaging CT scan which showed evidence of disease progression.  Dr. Julien Nordmann recommended repeating her molecular studies to ensure that the patient did not develop a new resistant mutation.  If negative for resistant mutation, then we would likely discuss starting palliative systemic chemotherapy.  The patient's molecular studies did not show any development of a new mutation.  Since last being seen, the patient denies any changes in her health.  Over the last couple weeks, the patient noted weight loss and early satiety which is a symptom she had when she was first diagnosed with her  malignancy.  Therefore she had a feeling before her last restaging scan that she had is disease progression.  She has a mild dry cough but is still not as bad as when she had a significant pleural effusion when she was first diagnosed.  She also has mild nausea and abdominal fullness.  She denies any fever, chills, or night sweats.  She denies any chest pain or hemoptysis.  Denies any significant shortness of breath denies any nausea, or diarrhea.  She has frequent constipation for which she takes Dulcolax.  Denies any headache or visual changes.  She is here today for more detailed discussion about her current condition and recommended treatment options.     MEDICAL HISTORY: Past Medical History:  Diagnosis Date   Bruit    Abdominal bruit - Abdominal aorta/renal duplex Doppler evaluation 12/07/03 -Mildly abnormal evaluation. 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 This is consistant w/median arcuate ligament compression syndrome. *Right & Left Kidney: Essentially equal in size, symmetrical in shape w/no significant abnormalities visualized. *Right & Left Renal Arteries: No significant abnormalities.   Cancer ([facility])    Edema extremities    LE edema    H/O myocardial perfusion scan 02/20/00   To rule out ischemia - Negative adequate Bruce protocol exercise stress test with a deconditioned exercise response and normal static myocardial perfusion images with EF calculated by QGS of 77%. Represents a low risk study.   Hyperlipidemia    Hypertension    Osteoarthritis    Osteoporosis    Pleural effusion 04/2020   Valvular heart disease  Mild valvular heart disease by Echo in 2010, all mild and symptomatic, including concentric LVH, MR, TR, and AI with pulmonary artery pressure of 32. EF was normal.    ALLERGIES:  is allergic to irbesartan.  MEDICATIONS:  Current Outpatient Medications  Medication Sig Dispense Refill   acetaminophen (TYLENOL) 325 MG tablet Take 2 tablets (650 mg  total) by mouth every 6 (six) hours as needed for mild pain (or Fever >/= 101). 20 tablet 0   amLODipine (NORVASC) 2.5 MG tablet TAKE 1 TABLET BY MOUTH EVERY DAY 90 tablet 1   aspirin 81 MG tablet Take 81 mg by mouth daily.     atorvastatin (LIPITOR) 20 MG tablet TAKE 1 TABLET BY MOUTH EVERYDAY AT BEDTIME 90 tablet 3   bisacodyl (DULCOLAX) 5 MG EC tablet Take 5 mg by mouth daily as needed for moderate constipation.     ketoconazole (NIZORAL) 2 % shampoo Apply topically.     loperamide (IMODIUM) 2 MG capsule Take by mouth as needed for diarrhea or loose stools.     metoprolol succinate (TOPROL-XL) 50 MG 24 hr tablet TAKE 1 TABLET BY MOUTH DAILY. TAKE WITH OR IMMEDIATELY FOLLOWING A MEAL. 90 tablet 3   Multiple Vitamin (MULTIVITAMIN) tablet Take 1 tablet by mouth daily.     osimertinib mesylate (TAGRISSO) 40 MG tablet Take 1 tablet (40 mg total) by mouth daily. 30 tablet 2   Polyethyl Glycol-Propyl Glycol (SYSTANE HYDRATION PF OP) Apply to eye daily.     spironolactone (ALDACTONE) 25 MG tablet Take 1 tablet (25 mg total) by mouth daily. 90 tablet 3   White Petrolatum-Mineral Oil (Kennedy PETROL-MINERAL OIL-LANOLIN) 0.1-0.1 % OINT Apply to eye as needed.     No current facility-administered medications for this visit.    SURGICAL HISTORY:  Past Surgical History:  Procedure Laterality Date   Abdominal aorta/Renal duplex Doppler Evaluation  12/07/03   For abdominal bruit. Mildly abnormal evaluation. (See bruit in medical history)   BREAST EXCISIONAL BIOPSY Left 1966   BREAST EXCISIONAL BIOPSY Left 1999   CATARACT EXTRACTION  2003   CHEST TUBE INSERTION N/A 07/04/2020   Procedure: REMOVAL PLEURAL DRAINAGE CATHETER;  Surgeon: Garner Nash, DO;  Location: [facility];  Service: Pulmonary;  Laterality: N/A;   COLONOSCOPY  10/22/2004   DEXA Bone Scan  06/23/2013   IR PERC PLEURAL DRAIN W/INDWELL CATH W/IMG GUIDE  05/10/2020   IR THORACENTESIS ASP PLEURAL SPACE W/IMG GUIDE  05/04/2020    REVIEW OF  SYSTEMS:   Review of Systems  Constitutional: Negative for appetite change, chills, fatigue, fever and unexpected weight change.  HENT:   Negative for mouth sores, nosebleeds, sore throat and trouble swallowing.   Eyes: Negative for eye problems and icterus.  Respiratory: Negative for cough, hemoptysis, shortness of breath and wheezing.   Cardiovascular: Negative for chest pain and leg swelling.  Gastrointestinal: Negative for abdominal pain, constipation, diarrhea, nausea and vomiting.  Genitourinary: Negative for bladder incontinence, difficulty urinating, dysuria, frequency and hematuria.   Musculoskeletal: Negative for back pain, gait problem, neck pain and neck stiffness.  Skin: Negative for itching and rash.  Neurological: Negative for dizziness, extremity weakness, gait problem, headaches, light-headedness and seizures.  Hematological: Negative for adenopathy. Does not bruise/bleed easily.  Psychiatric/Behavioral: Negative for confusion, depression and sleep disturbance. The patient is not nervous/anxious.     PHYSICAL EXAMINATION:  There were no vitals taken for this visit.  ECOG PERFORMANCE STATUS: {CHL ONC ECOG [id number]  Physical Exam  Constitutional: Oriented to  person, place, and time and well-developed, well-nourished, and in no distress. No distress.  HENT:  Head: Normocephalic and atraumatic.  Mouth/Throat: Oropharynx is clear and moist. No oropharyngeal exudate.  Eyes: Conjunctivae are normal. Right eye exhibits no discharge. Left eye exhibits no discharge. No scleral icterus.  Neck: Normal range of motion. Neck supple.  Cardiovascular: Normal rate, regular rhythm, normal heart sounds and intact distal pulses.   Pulmonary/Chest: Effort normal and breath sounds normal. No respiratory distress. No wheezes. No rales.  Abdominal: Soft. Bowel sounds are normal. Exhibits no distension and no mass. There is no tenderness.  Musculoskeletal: Normal range of motion.  Exhibits no edema.  Lymphadenopathy:    No cervical adenopathy.  Neurological: Alert and oriented to person, place, and time. Exhibits normal muscle tone. Gait normal. Coordination normal.  Skin: Skin is warm and dry. No rash noted. Not diaphoretic. No erythema. No pallor.  Psychiatric: Mood, memory and judgment normal.  Vitals reviewed.  LABORATORY DATA: Lab Results  Component Value Date   WBC 10.4 01/28/2022   HGB 14.3 01/28/2022   HCT 41.9 01/28/2022   MCV 90.9 01/28/2022   PLT 295 01/28/2022      Chemistry      Component Value Date/Time   NA 133 (L) 01/28/2022 0949   NA 137 05/25/2020 0000   K 4.4 01/28/2022 0949   CL 102 01/28/2022 0949   CO2 23 01/28/2022 0949   BUN 23 01/28/2022 0949   BUN 12 05/25/2020 0000   CREATININE 1.21 (H) 01/28/2022 0949   CREATININE 1.22 (H) 05/21/2021 0800   GLU 83 05/25/2020 0000      Component Value Date/Time   CALCIUM 10.5 (H) 01/28/2022 0949   ALKPHOS 68 01/28/2022 0949   AST 22 01/28/2022 0949   ALT 13 01/28/2022 0949   BILITOT 0.5 01/28/2022 0949       RADIOGRAPHIC STUDIES:  CT Chest W Contrast  Result Date: 01/29/2022 CLINICAL DATA:  Primary Cancer Type: Lung Imaging Indication: Assess response to therapy Interval therapy since last imaging? Yes Initial Cancer Diagnosis Date: 05/01/2020; Established by: Biopsy-proven Detailed Pathology: Stage IV non-small cell lung cancer, adenocarcinoma. Primary Tumor location:  Left upper lobe. Multiple brain metastases. Surgeries: No. Immunotherapy? Tagrisso daily Radiation therapy? No * Tracking Code: BO * EXAM: CT CHEST, ABDOMEN, AND PELVIS WITH CONTRAST TECHNIQUE: Multidetector CT imaging of the chest, abdomen and pelvis was performed following the standard protocol during bolus administration of intravenous contrast. RADIATION DOSE REDUCTION: This exam was performed according to the departmental dose-optimization program which includes automated exposure control, adjustment of the mA and/or  kV according to patient size and/or use of iterative reconstruction technique. CONTRAST:  34m OMNIPAQUE IOHEXOL 300 MG/ML  SOLN COMPARISON:  Most recent CT chest, abdomen and pelvis 11/05/2021. 05/31/2020 PET-CT. FINDINGS: CT CHEST FINDINGS Cardiovascular: Aortic atherosclerosis. Normal heart size, without pericardial effusion. Aortic valve calcification is nonspecific in this age group. No central pulmonary embolism, on this non-dedicated study. Mediastinum/Nodes: 9 mm left supraclavicular node of 11/2 is increased from 8 mm on the prior exam (when remeasured). Left axillary 8 mm node on 24/2 is increased from 6 mm on the prior (when remeasured). 1.7 cm right paratracheal node on 26/2 is increased from 1.2 cm previously (when remeasured). Subcarinal 1.5 cm node on 25/2 measured 9 mm on the prior. Lungs/Pleura: Trace right pleural fluid is new. Trace left pleural fluid is similar. Left-sided pleural thickening and mild hyperenhancement inferiorly are similar. The left upper lobe pulmonary nodule measures 2.9 x  2.1 cm on 74/4 versus 3.0 x 2.2 cm on the prior exam, stable. Worsening of left-greater-than-right areas of pleural-based nodularity, interstitial thickening throughout. For example, posterior left upper lobe pleural-based 6 mm nodule on 54/4 is new. Anteromedial left upper lobe 7 mm pleural-based nodule on 69/4 is new. Nodular consolidation in the posterior left lower lobe is pleural-based and new at 1.4 x 1.3 cm on 68/4. Right upper lobe peribronchovascular nodularity is new on 59/4 with more confluent 1.3 x 1.0 cm peribronchovascular nodule on 74/4. Anterior left lower lobe peribronchovascular nodular septal thickening is increased, including on 82/4 with a confluence area of pleural-based nodularity at 2.1 x 1.3 cm. Smooth septal thickening in the right lower lobe is mild, new on 109/4. Musculoskeletal: Left-sided T5 sclerotic lesion measures 1.5 cm on 16/2 versus 1.4 cm on the prior exam (when  remeasured). None CT ABDOMEN PELVIS FINDINGS Hepatobiliary: Scattered subcentimeter hepatic cysts. Normal gallbladder, without biliary ductal dilatation. Pancreas: 7 mm pancreatic body/tail junction low-density lesion is similar and of doubtful clinical significance given comorbidities. No duct dilatation or acute inflammation. Spleen: Normal in size, without focal abnormality. Adrenals/Urinary Tract: Normal left adrenal gland. Mild right adrenal nodularity is unchanged, including on 57/2. Bilateral renal cysts including at up to 5.3 cm in the lower pole right kidney. An upper pole left renal lesion of 5.2 cm on 60/2 demonstrates partially calcified septation on 61/2, most consistent with a Bosniak 2 F lesion. No hydronephrosis. Normal urinary bladder. Stomach/Bowel: Normal stomach, without wall thickening. Large amount of ascending colonic stool. Normal small bowel. Vascular/Lymphatic: Aortic atherosclerosis. 11 mm aortocaval node on 62/2 measured 8 mm on the prior exam (when remeasured). Left periaortic index 7 mm node on 68/2 measured 5 mm on the prior exam (when remeasured). Left common iliac node measures 9 mm on 78/2 versus 7 mm on the prior. Reproductive: Hysterectomy. Other: No significant free fluid. Moderate pelvic floor laxity. No free intraperitoneal air. Musculoskeletal: Mild osteopenia. 5 mm sclerotic lesion in the L3 vertebral body is unchanged. IMPRESSION: 1. Similar size of the left upper lobe pulmonary nodule. 2. Combination of findings within the left-greater-than-right lungs, which are highly suspicious for a combination of progressive lymphangitic tumor spread and left-sided pleural-based metastasis. 3. Progressive thoracic, lower cervical, and upper abdominal adenopathy, most consistent with nodal metastasis. 4. Minimally progressive sclerotic metastasis within the T5 vertebral body. 5. Left renal 5.2 cm lesion is favored to represent a Bosniak 2 F type cyst. This can be re-evaluated on  follow-up. 6. Similar mild right adrenal nodularity, nonspecific. 7. New trace right pleural fluid.  Similar trace left pleural fluid. Electronically Signed   By: Abigail Miyamoto M.D.   On: 01/29/2022 11:52   CT Abdomen Pelvis W Contrast  Result Date: 01/29/2022 CLINICAL DATA:  Primary Cancer Type: Lung Imaging Indication: Assess response to therapy Interval therapy since last imaging? Yes Initial Cancer Diagnosis Date: 05/01/2020; Established by: Biopsy-proven Detailed Pathology: Stage IV non-small cell lung cancer, adenocarcinoma. Primary Tumor location:  Left upper lobe. Multiple brain metastases. Surgeries: No. Immunotherapy? Tagrisso daily Radiation therapy? No * Tracking Code: BO * EXAM: CT CHEST, ABDOMEN, AND PELVIS WITH CONTRAST TECHNIQUE: Multidetector CT imaging of the chest, abdomen and pelvis was performed following the standard protocol during bolus administration of intravenous contrast. RADIATION DOSE REDUCTION: This exam was performed according to the departmental dose-optimization program which includes automated exposure control, adjustment of the mA and/or kV according to patient size and/or use of iterative reconstruction technique. CONTRAST:  70m OMNIPAQUE  IOHEXOL 300 MG/ML  SOLN COMPARISON:  Most recent CT chest, abdomen and pelvis 11/05/2021. 05/31/2020 PET-CT. FINDINGS: CT CHEST FINDINGS Cardiovascular: Aortic atherosclerosis. Normal heart size, without pericardial effusion. Aortic valve calcification is nonspecific in this age group. No central pulmonary embolism, on this non-dedicated study. Mediastinum/Nodes: 9 mm left supraclavicular node of 11/2 is increased from 8 mm on the prior exam (when remeasured). Left axillary 8 mm node on 24/2 is increased from 6 mm on the prior (when remeasured). 1.7 cm right paratracheal node on 26/2 is increased from 1.2 cm previously (when remeasured). Subcarinal 1.5 cm node on 25/2 measured 9 mm on the prior. Lungs/Pleura: Trace right pleural fluid is  new. Trace left pleural fluid is similar. Left-sided pleural thickening and mild hyperenhancement inferiorly are similar. The left upper lobe pulmonary nodule measures 2.9 x 2.1 cm on 74/4 versus 3.0 x 2.2 cm on the prior exam, stable. Worsening of left-greater-than-right areas of pleural-based nodularity, interstitial thickening throughout. For example, posterior left upper lobe pleural-based 6 mm nodule on 54/4 is new. Anteromedial left upper lobe 7 mm pleural-based nodule on 69/4 is new. Nodular consolidation in the posterior left lower lobe is pleural-based and new at 1.4 x 1.3 cm on 68/4. Right upper lobe peribronchovascular nodularity is new on 59/4 with more confluent 1.3 x 1.0 cm peribronchovascular nodule on 74/4. Anterior left lower lobe peribronchovascular nodular septal thickening is increased, including on 82/4 with a confluence area of pleural-based nodularity at 2.1 x 1.3 cm. Smooth septal thickening in the right lower lobe is mild, new on 109/4. Musculoskeletal: Left-sided T5 sclerotic lesion measures 1.5 cm on 16/2 versus 1.4 cm on the prior exam (when remeasured). None CT ABDOMEN PELVIS FINDINGS Hepatobiliary: Scattered subcentimeter hepatic cysts. Normal gallbladder, without biliary ductal dilatation. Pancreas: 7 mm pancreatic body/tail junction low-density lesion is similar and of doubtful clinical significance given comorbidities. No duct dilatation or acute inflammation. Spleen: Normal in size, without focal abnormality. Adrenals/Urinary Tract: Normal left adrenal gland. Mild right adrenal nodularity is unchanged, including on 57/2. Bilateral renal cysts including at up to 5.3 cm in the lower pole right kidney. An upper pole left renal lesion of 5.2 cm on 60/2 demonstrates partially calcified septation on 61/2, most consistent with a Bosniak 2 F lesion. No hydronephrosis. Normal urinary bladder. Stomach/Bowel: Normal stomach, without wall thickening. Large amount of ascending colonic stool.  Normal small bowel. Vascular/Lymphatic: Aortic atherosclerosis. 11 mm aortocaval node on 62/2 measured 8 mm on the prior exam (when remeasured). Left periaortic index 7 mm node on 68/2 measured 5 mm on the prior exam (when remeasured). Left common iliac node measures 9 mm on 78/2 versus 7 mm on the prior. Reproductive: Hysterectomy. Other: No significant free fluid. Moderate pelvic floor laxity. No free intraperitoneal air. Musculoskeletal: Mild osteopenia. 5 mm sclerotic lesion in the L3 vertebral body is unchanged. IMPRESSION: 1. Similar size of the left upper lobe pulmonary nodule. 2. Combination of findings within the left-greater-than-right lungs, which are highly suspicious for a combination of progressive lymphangitic tumor spread and left-sided pleural-based metastasis. 3. Progressive thoracic, lower cervical, and upper abdominal adenopathy, most consistent with nodal metastasis. 4. Minimally progressive sclerotic metastasis within the T5 vertebral body. 5. Left renal 5.2 cm lesion is favored to represent a Bosniak 2 F type cyst. This can be re-evaluated on follow-up. 6. Similar mild right adrenal nodularity, nonspecific. 7. New trace right pleural fluid.  Similar trace left pleural fluid. Electronically Signed   By: Adria Devon.D.  On: 01/29/2022 11:52     ASSESSMENT/PLAN:  This is a very pleasant 88 years old Caucasian female diagnosed with a stage IV (T3, N3, M1 C) non-small cell lung cancer, adenocarcinoma with positive EGFR mutation with deletion in exon 19 diagnosed in December 2021. She presented with a left lower lobe lung mass in addition to right supraclavicular, left hilar, and subcarinal lymphadenopathy. She also had metastatic adenopathy in the abdomen and left pleural based metastasis, and a malignant left pleural effusion and multiple brain metastasis.    The patient started treatment with Tagrisso 80 mg p.o. daily on June 10, 2020.  Starting from October 2022 her dose has changed  to 40 mg p.o. daily due to intolerance.     The patient recently had a restaging CT scan that was reviewed at her last appointment on 01/30/22. The scan showed concern with progressive lymphangitic tumor spread and left sided pleural-based metastasis.  There is also some progressive thoracic, lower cervical, and upper abdominal adenopathy.   The patient had repeat molecular studies by [employer] which was negative for new resistant mutation.  Therefore, the patient was seen with Dr. Julien Nordmann today.  Dr. Julien Nordmann had a lengthy discussion with the patient today about her current condition and recommended treatment options.  Dr. Julien Nordmann gave the patient the option of a referral to palliative care and hospice versus starting palliative systemic chemotherapy with carboplatin for an AUC of 5, Alimta 500 mg per metered square, and Avastin IV every 3 weeks.  The patient is interested in this option and she is expected to start her first dose of treatment next week on 02/20/2022.  The adverse side effects of treatment were discussed including but not limited to nausea, vomiting, myelosuppression, fatigue, kidney, liver dysfunction, and the adverse side effects of Avastin which include hypertension, proteinuria, abnormal bleeding, and in rare situations GI perforation.  We will see the patient back for follow-up visit in 2 weeks for evaluation and repeat blood work in 1 week follow-up visit to manage any adverse side effects of treatment.  I sent a prescription for 1 mg of folic acid to take p.o. daily, Compazine 10 mg p.o. every 6 hours as needed for nausea, and Decadron 1 tablet twice daily the day before, the day of, the day after chemotherapy.  I will arrange for the patient to have a B12 injection while in the clinic today.  The patient was advised to call immediately if she has any concerning symptoms in the interval. The patient voices understanding of current disease status and treatment options and is  in agreement with the current care plan. All questions were answered. The patient knows to call the clinic with any problems, questions or concerns. We can certainly see the patient much sooner if necessary            No orders of the defined types were placed in this encounter.    I spent {CHL ONC TIME VISIT - HXTAV:[id number]} counseling the patient face to face. The total time spent in the appointment was {CHL ONC TIME VISIT - VVZSM:[id number]}.  Shamecka Hocutt L Wasyl Dornfeld, PA-C 02/11/22

## 2022-02-13 ENCOUNTER — Inpatient Hospital Stay: Payer: Medicare PPO

## 2022-02-13 ENCOUNTER — Inpatient Hospital Stay: Payer: Medicare PPO | Attending: Physician Assistant | Admitting: Physician Assistant

## 2022-02-13 ENCOUNTER — Other Ambulatory Visit: Payer: Self-pay

## 2022-02-13 VITALS — BP 114/60 | HR 85 | Temp 98.2°F | Resp 14 | Ht 64.0 in | Wt 102.8 lb

## 2022-02-13 DIAGNOSIS — C7931 Secondary malignant neoplasm of brain: Secondary | ICD-10-CM

## 2022-02-13 DIAGNOSIS — C782 Secondary malignant neoplasm of pleura: Secondary | ICD-10-CM | POA: Diagnosis not present

## 2022-02-13 DIAGNOSIS — Z79899 Other long term (current) drug therapy: Secondary | ICD-10-CM | POA: Insufficient documentation

## 2022-02-13 DIAGNOSIS — R110 Nausea: Secondary | ICD-10-CM | POA: Insufficient documentation

## 2022-02-13 DIAGNOSIS — C3412 Malignant neoplasm of upper lobe, left bronchus or lung: Secondary | ICD-10-CM | POA: Diagnosis not present

## 2022-02-13 DIAGNOSIS — C3432 Malignant neoplasm of lower lobe, left bronchus or lung: Secondary | ICD-10-CM | POA: Insufficient documentation

## 2022-02-13 DIAGNOSIS — Z7189 Other specified counseling: Secondary | ICD-10-CM | POA: Diagnosis not present

## 2022-02-13 DIAGNOSIS — C3492 Malignant neoplasm of unspecified part of left bronchus or lung: Secondary | ICD-10-CM

## 2022-02-13 DIAGNOSIS — Z5112 Encounter for antineoplastic immunotherapy: Secondary | ICD-10-CM | POA: Diagnosis not present

## 2022-02-13 DIAGNOSIS — K5900 Constipation, unspecified: Secondary | ICD-10-CM | POA: Diagnosis not present

## 2022-02-13 DIAGNOSIS — C778 Secondary and unspecified malignant neoplasm of lymph nodes of multiple regions: Secondary | ICD-10-CM | POA: Diagnosis not present

## 2022-02-13 MED ORDER — PROCHLORPERAZINE MALEATE 10 MG PO TABS: 10.0000 mg | ORAL_TABLET | Freq: Four times a day (QID) | ORAL | 2 refills | Status: DC | PRN

## 2022-02-13 MED ORDER — FOLIC ACID 1 MG PO TABS: 1.0000 mg | ORAL_TABLET | Freq: Every day | ORAL | 2 refills | Status: DC

## 2022-02-13 MED ORDER — DEXAMETHASONE 4 MG PO TABS: ORAL_TABLET | ORAL | 1 refills | Status: DC

## 2022-02-13 MED ORDER — CYANOCOBALAMIN 1000 MCG/ML IJ SOLN
1000.0000 ug | Freq: Once | INTRAMUSCULAR | Status: AC
  Administered 2022-02-13: 1000 ug via INTRAMUSCULAR
  Filled 2022-02-13: qty 1

## 2022-02-13 NOTE — Patient Instructions (Addendum)
-  There are two main categories of lung cancer, they are named based on the size of the cancer cell. One is called Non-Small cell lung cancer. The other type is Small Cell Lung Cancer -The sample (biopsy) that they took of your tumor was consistent with a subtype of Non-small cell lung cancer called Adenocarcinoma. This is the most common type of lung cancer.  -We covered a lot of important information at your appointment today regarding what the treatment plan is moving forward. Here are the the main points that were discussed at your office visit with [place] today:  -The treatment that you will receive consists of three chemotherapy drugs, called Carboplatin and Alimta (also called Pemetrexed) and Avastin (Bevacizumab)  -We are planning on starting your treatment next week on 02/21/22 but before your start your treatment, I would like you to attend a Chemotherapy Education Class. This involves having you sit down with one of our nurse educators. She will discuss with your one-on-one more details about your treatment as well as general information about resources here at the [facility] treatment will be given once every 3 weeks. We will check your labs once a week for the first ~6treatments just to make sure that important components of your blood are in an acceptable range -We will get a CT scan after 3 treatments to check on the progress of treatment  Medications:  -I have sent a few important medication prescriptions to your pharmacy.  -Compazine was sent to your pharmacy. This medication is for nausea. You may take this every 6 hours as needed if you feel nausous.  -I have also sent a prescription for 1 mg of folic acid to your pharmacy. We need you to take 1 tablet every day. -You will receive a Vitamin B12 injection in the clinic every 9 weeks  -I have sent a prescription for Decadron (dexamethasone) to your pharmacy. This medication is a steroid. The purpose of thie medication is because  it is a pre-medication for your treatment. You will need to take 1 (4 mg) tablet TWICE a day the day before, the day of, and the day after chemotherapy. This means that you will will take 6 tablets total during those 3 consecutive days.  -Please continue Tagrisso   Side Effects:  -The adverse effect of this treatment including but not limited to alopecia (losing your hair), myelosuppression (drops in the blood counts), nausea and vomiting, peripheral neuropathy (numbness and tingling in the hands and feet), liver or renal dysfunction. This treatment may also cause bleeding or GI perforation.   Imaging: -We will repeat a brain MRI to make sure no progression in that area   Follow up:  -We will see you back for a follow up visit in 2 weeks for a 1 week follow up

## 2022-02-13 NOTE — Progress Notes (Signed)
DISCONTINUE ON PATHWAY REGIMEN - Non-Small Cell Lung     A cycle is every 28 days:     Osimertinib   **Always confirm dose/schedule in your pharmacy ordering system**  REASON: Disease Progression PRIOR TREATMENT: [id number]: Osimertinib 80 mg PO Once Daily Until Progression or Unacceptable Toxicity TREATMENT RESPONSE: Partial Response (PR)  START ON PATHWAY REGIMEN - Non-Small Cell Lung     Cycles 1 through up to 6: A cycle is every 21 days:     Bevacizumab-xxxx      Pemetrexed      Carboplatin   **Always confirm dose/schedule in your pharmacy ordering system**  Patient Characteristics: Stage IV Metastatic, Nonsquamous, Molecular Analysis Completed, Molecular Alteration Present and Targeted Therapy Exhausted OR EGFR Exon 20+ or KRAS G12C+ or HER2+ Present and No Prior Chemo/Immunotherapy OR No Alteration Present, Initial  Chemotherapy/Immunotherapy, PS = 0, 1, EGFR Mutation Positive Therapeutic Status: Stage IV Metastatic Histology: Nonsquamous Cell Broad Molecular Profiling Status: Molecular Analysis Completed Molecular Analysis Results: Alteration Present and Targeted Therapy Exhausted ECOG Performance Status: 1 Chemotherapy/Immunotherapy Line of Therapy: Initial Chemotherapy/Immunotherapy Intent of Therapy: Non-Curative / Palliative Intent, Discussed with Patient

## 2022-02-14 ENCOUNTER — Telehealth: Payer: Self-pay | Admitting: Internal Medicine

## 2022-02-14 ENCOUNTER — Other Ambulatory Visit: Payer: Self-pay

## 2022-02-14 NOTE — Telephone Encounter (Signed)
Scheduled per 10/04 los and work-queue, patient has been called and notified of upcoming appointments.

## 2022-02-18 ENCOUNTER — Telehealth: Payer: Self-pay

## 2022-02-18 NOTE — Telephone Encounter (Signed)
This nurse received a call from this patient wanting to know if she could take the Flu vaccine and COVID booster that is being offered by her Nursing Home.  She also request to know if she should stop taking her 81mg  Aspirin.  This nurse advised patient per provider that she can get her Flu vaccine and the COVID booster.  Also advised to continue taking her  Aspirin and other medications.  Patient acknowledged understanding.  No further questions at this time.

## 2022-02-19 ENCOUNTER — Other Ambulatory Visit: Payer: Self-pay

## 2022-02-19 MED FILL — Fosaprepitant Dimeglumine For IV Infusion 150 MG (Base Eq): INTRAVENOUS | Qty: 5 | Status: AC

## 2022-02-19 MED FILL — Dexamethasone Sodium Phosphate Inj 100 MG/10ML: INTRAMUSCULAR | Qty: 1 | Status: AC

## 2022-02-20 ENCOUNTER — Inpatient Hospital Stay: Payer: Medicare PPO

## 2022-02-20 ENCOUNTER — Other Ambulatory Visit: Payer: Self-pay

## 2022-02-20 VITALS — BP 132/76 | HR 69 | Temp 97.6°F | Resp 16 | Wt 101.8 lb

## 2022-02-20 DIAGNOSIS — C782 Secondary malignant neoplasm of pleura: Secondary | ICD-10-CM | POA: Diagnosis not present

## 2022-02-20 DIAGNOSIS — C3492 Malignant neoplasm of unspecified part of left bronchus or lung: Secondary | ICD-10-CM

## 2022-02-20 DIAGNOSIS — K5900 Constipation, unspecified: Secondary | ICD-10-CM | POA: Diagnosis not present

## 2022-02-20 DIAGNOSIS — C778 Secondary and unspecified malignant neoplasm of lymph nodes of multiple regions: Secondary | ICD-10-CM | POA: Diagnosis not present

## 2022-02-20 DIAGNOSIS — Z79899 Other long term (current) drug therapy: Secondary | ICD-10-CM | POA: Diagnosis not present

## 2022-02-20 DIAGNOSIS — C3412 Malignant neoplasm of upper lobe, left bronchus or lung: Secondary | ICD-10-CM

## 2022-02-20 DIAGNOSIS — Z5112 Encounter for antineoplastic immunotherapy: Secondary | ICD-10-CM | POA: Diagnosis not present

## 2022-02-20 DIAGNOSIS — R110 Nausea: Secondary | ICD-10-CM | POA: Diagnosis not present

## 2022-02-20 DIAGNOSIS — C3432 Malignant neoplasm of lower lobe, left bronchus or lung: Secondary | ICD-10-CM | POA: Diagnosis not present

## 2022-02-20 LAB — CBC WITH DIFFERENTIAL (CANCER CENTER ONLY)
Abs Immature Granulocytes: 0.1 10*3/uL — ABNORMAL HIGH (ref 0.00–0.07)
Basophils Absolute: 0 10*3/uL (ref 0.0–0.1)
Basophils Relative: 0 %
Eosinophils Absolute: 0 10*3/uL (ref 0.0–0.5)
Eosinophils Relative: 0 %
HCT: 39.7 % (ref 36.0–46.0)
Hemoglobin: 13.7 g/dL (ref 12.0–15.0)
Immature Granulocytes: 1 %
Lymphocytes Relative: 1 %
Lymphs Abs: 0.3 10*3/uL — ABNORMAL LOW (ref 0.7–4.0)
MCH: 31 pg (ref 26.0–34.0)
MCHC: 34.5 g/dL (ref 30.0–36.0)
MCV: 89.8 fL (ref 80.0–100.0)
Monocytes Absolute: 0.4 10*3/uL (ref 0.1–1.0)
Monocytes Relative: 2 %
Neutro Abs: 19.2 10*3/uL — ABNORMAL HIGH (ref 1.7–7.7)
Neutrophils Relative %: 96 %
Platelet Count: 256 10*3/uL (ref 150–400)
RBC: 4.42 MIL/uL (ref 3.87–5.11)
RDW: 14.7 % (ref 11.5–15.5)
WBC Count: 19.9 10*3/uL — ABNORMAL HIGH (ref 4.0–10.5)
nRBC: 0 % (ref 0.0–0.2)

## 2022-02-20 LAB — CMP (CANCER CENTER ONLY)
ALT: 14 U/L (ref 0–44)
AST: 22 U/L (ref 15–41)
Albumin: 4.3 g/dL (ref 3.5–5.0)
Alkaline Phosphatase: 68 U/L (ref 38–126)
Anion gap: 10 (ref 5–15)
BUN: 41 mg/dL — ABNORMAL HIGH (ref 8–23)
CO2: 20 mmol/L — ABNORMAL LOW (ref 22–32)
Calcium: 10.9 mg/dL — ABNORMAL HIGH (ref 8.9–10.3)
Chloride: 99 mmol/L (ref 98–111)
Creatinine: 1.38 mg/dL — ABNORMAL HIGH (ref 0.44–1.00)
GFR, Estimated: 37 mL/min — ABNORMAL LOW (ref >60)
Glucose, Bld: 168 mg/dL — ABNORMAL HIGH (ref 70–99)
Potassium: 4.9 mmol/L (ref 3.5–5.1)
Sodium: 129 mmol/L — ABNORMAL LOW (ref 135–145)
Total Bilirubin: 0.5 mg/dL (ref 0.3–1.2)
Total Protein: 7.4 g/dL (ref 6.5–8.1)

## 2022-02-20 LAB — TOTAL PROTEIN, URINE DIPSTICK: Protein, ur: <30 mg/dL — AB

## 2022-02-20 LAB — TSH: TSH: 0.995 u[IU]/mL (ref 0.350–4.500)

## 2022-02-20 MED ORDER — PALONOSETRON HCL INJECTION 0.25 MG/5ML
0.2500 mg | Freq: Once | INTRAVENOUS | Status: AC
  Administered 2022-02-20: 0.25 mg via INTRAVENOUS
  Filled 2022-02-20: qty 5

## 2022-02-20 MED ORDER — SODIUM CHLORIDE 0.9 % IV SOLN: 500.0000 mg/m2 | Freq: Once | INTRAVENOUS | Status: DC

## 2022-02-20 MED ORDER — SODIUM CHLORIDE 0.9 % IV SOLN
375.0000 mg/m2 | Freq: Once | INTRAVENOUS | Status: AC
  Administered 2022-02-20: 500 mg via INTRAVENOUS
  Filled 2022-02-20: qty 20

## 2022-02-20 MED ORDER — SODIUM CHLORIDE 0.9 % IV SOLN
150.0000 mg | Freq: Once | INTRAVENOUS | Status: AC
  Administered 2022-02-20: 150 mg via INTRAVENOUS
  Filled 2022-02-20: qty 150

## 2022-02-20 MED ORDER — SODIUM CHLORIDE 0.9 % IV SOLN
10.0000 mg | Freq: Once | INTRAVENOUS | Status: AC
  Administered 2022-02-20: 10 mg via INTRAVENOUS
  Filled 2022-02-20: qty 10

## 2022-02-20 MED ORDER — SODIUM CHLORIDE 0.9 % IV SOLN
15.0000 mg/kg | Freq: Once | INTRAVENOUS | Status: AC
  Administered 2022-02-20: 700 mg via INTRAVENOUS
  Filled 2022-02-20: qty 16

## 2022-02-20 MED ORDER — SODIUM CHLORIDE 0.9 % IV SOLN: Freq: Once | INTRAVENOUS | Status: AC

## 2022-02-20 MED ORDER — SODIUM CHLORIDE 0.9 % IV SOLN
240.0000 mg | Freq: Once | INTRAVENOUS | Status: AC
  Administered 2022-02-20: 240 mg via INTRAVENOUS
  Filled 2022-02-20: qty 24

## 2022-02-20 NOTE — Patient Instructions (Signed)
[facility]  Discharge Instructions: Thank you for choosing [facility] to provide your oncology and hematology care.   If you have a lab appointment with the [facility], please go directly to the [facility] and check in at the registration area.   Wear comfortable clothing and clothing appropriate for easy access to any Portacath or PICC line.   We strive to give you quality time with your provider. You may need to reschedule your appointment if you arrive late (15 or more minutes).  Arriving late affects you and other patients whose appointments are after yours.  Also, if you miss three or more appointments without notifying the office, you may be dismissed from the clinic at the provider's discretion.      For prescription refill requests, have your pharmacy contact our office and allow 72 hours for refills to be completed.    Today you received the following chemotherapy and/or immunotherapy agents : bevacizumab, pemetrexed, carboplatin      To help prevent nausea and vomiting after your treatment, we encourage you to take your nausea medication as directed.  BELOW ARE SYMPTOMS THAT SHOULD BE REPORTED IMMEDIATELY: *FEVER GREATER THAN 100.4 F (38 C) OR HIGHER *CHILLS OR SWEATING *NAUSEA AND VOMITING THAT IS NOT CONTROLLED WITH YOUR NAUSEA MEDICATION *UNUSUAL SHORTNESS OF BREATH *UNUSUAL BRUISING OR BLEEDING *URINARY PROBLEMS (pain or burning when urinating, or frequent urination) *BOWEL PROBLEMS (unusual diarrhea, constipation, pain near the anus) TENDERNESS IN MOUTH AND THROAT WITH OR WITHOUT PRESENCE OF ULCERS (sore throat, sores in mouth, or a toothache) UNUSUAL RASH, SWELLING OR PAIN  UNUSUAL VAGINAL DISCHARGE OR ITCHING   Items with * indicate a potential emergency and should be followed up as soon as possible or go to the Emergency Department if any problems should occur.  Please show the CHEMOTHERAPY ALERT CARD or  IMMUNOTHERAPY ALERT CARD at check-in to the Emergency Department and triage nurse.  Should you have questions after your visit or need to cancel or reschedule your appointment, please contact [facility]  Dept: [phone]  and follow the prompts.  Office hours are 8:00 a.m. to 4:30 p.m. Monday - Friday. Please note that voicemails left after 4:00 p.m. may not be returned until the following business day.  We are closed weekends and major holidays. You have access to a nurse at all times for urgent questions. Please call the main number to the clinic Dept: [phone] and follow the prompts.   For any non-urgent questions, you may also contact your provider using MyChart. We now offer e-Visits for anyone 86 and older to request care online for non-urgent symptoms. For details visit [email or web address].   Also download the MyChart app! Go to the app store, search "MyChart", open the app, select [facility], and log in with your MyChart username and password.  Masks are optional in the cancer centers. If you would like for your care team to wear a mask while they are taking care of you, please let them know. You may have one support person who is at least 86 years old accompany you for your appointments. Bevacizumab Injection What is this medication? BEVACIZUMAB (be va SIZ yoo mab) treats some types of cancer. It works by blocking a protein that causes cancer cells to grow and multiply. This helps to slow or stop the spread of cancer cells. It is a monoclonal antibody. This medicine may be used for other purposes; ask your health care  provider or pharmacist if you have questions. COMMON BRAND NAME(S): Alymsys, Avastin, MVASI, [facility] What should I tell my care team before I take this medication? They need to know if you have any of these conditions: Blood clots Coughing up blood Having or recent surgery Heart failure High blood pressure History of a connection  between 2 or more body parts that do not usually connect (fistula) History of a tear in your stomach or intestines Protein in your urine An unusual or allergic reaction to bevacizumab, other medications, foods, dyes, or preservatives Pregnant or trying to get pregnant Breast-feeding How should I use this medication? This medication is injected into a vein. It is given by your care team in a hospital or clinic setting. Talk to your care team the use of this medication in children. Special care may be needed. Overdosage: If you think you have taken too much of this medicine contact a poison control center or emergency room at once. NOTE: This medicine is only for you. Do not share this medicine with others. What if I miss a dose? Keep appointments for follow-up doses. It is important not to miss your dose. Call your care team if you are unable to keep an appointment. What may interact with this medication? Interactions are not expected. This list may not describe all possible interactions. Give your health care provider a list of all the medicines, herbs, non-prescription drugs, or dietary supplements you use. Also tell them if you smoke, drink alcohol, or use illegal drugs. Some items may interact with your medicine. What should I watch for while using this medication? Your condition will be monitored carefully while you are receiving this medication. You may need blood work while taking this medication. This medication may make you feel generally unwell. This is not uncommon as chemotherapy can affect healthy cells as well as cancer cells. Report any side effects. Continue your course of treatment even though you feel ill unless your care team tells you to stop. This medication may increase your risk to bruise or bleed. Call your care team if you notice any unusual bleeding. Before having surgery, talk to your care team to make sure it is ok. This medication can increase the risk of poor healing  of your surgical site or wound. You will need to stop this medication for 28 days before surgery. After surgery, wait at least 28 days before restarting this medication. Make sure the surgical site or wound is healed enough before restarting this medication. Talk to your care team if questions. Talk to your care team if you may be pregnant. Serious birth defects can occur if you take this medication during pregnancy and for 6 months after the last dose. Contraception is recommended while taking this medication and for 6 months after the last dose. Your care team can help you find the option that works for you. Do not breastfeed while taking this medication and for 6 months after the last dose. This medication can cause infertility. Talk to your care team if you are concerned about your fertility. What side effects may I notice from receiving this medication? Side effects that you should report to your care team as soon as possible: Allergic reactions--skin rash, itching, hives, swelling of the face, lips, tongue, or throat Bleeding--bloody or black, tar-like stools, vomiting blood or brown material that looks like coffee grounds, red or dark brown urine, small red or purple spots on skin, unusual bruising or bleeding Blood clot--pain, swelling, or warmth in  the leg, shortness of breath, chest pain Heart attack--pain or tightness in the chest, shoulders, arms, or jaw, nausea, shortness of breath, cold or clammy skin, feeling faint or lightheaded Heart failure--shortness of breath, swelling of the ankles, feet, or hands, sudden weight gain, unusual weakness or fatigue Increase in blood pressure Infection--fever, chills, cough, sore throat, wounds that don't heal, pain or trouble when passing urine, general feeling of discomfort or being unwell Infusion reactions--chest pain, shortness of breath or trouble breathing, feeling faint or lightheaded Kidney injury--decrease in the amount of urine, swelling of  the ankles, hands, or feet Stomach pain that is severe, does not go away, or gets worse Stroke--sudden numbness or weakness of the face, arm, or leg, trouble speaking, confusion, trouble walking, loss of balance or coordination, dizziness, severe headache, change in vision Sudden and severe headache, confusion, change in vision, seizures, which may be signs of posterior reversible encephalopathy syndrome (PRES) Side effects that usually do not require medical attention (report to your care team if they continue or are bothersome): Back pain Change in taste Diarrhea Dry skin Increased tears Nosebleed This list may not describe all possible side effects. Call your doctor for medical advice about side effects. You may report side effects to FDA at 1-800-FDA-1088. Where should I keep my medication? This medication is given in a hospital or clinic. It will not be stored at home. NOTE: This sheet is a summary. It may not cover all possible information. If you have questions about this medicine, talk to your doctor, pharmacist, or health care provider.  2023 Elsevier/Gold Standard (2021-09-11 00:00:00) Pemetrexed Injection What is this medication? PEMETREXED (PEM e TREX ed) treats some types of cancer. It works by slowing down the growth of cancer cells. This medicine may be used for other purposes; ask your health care provider or pharmacist if you have questions. COMMON BRAND NAME(S): Alimta, PEMFEXY What should I tell my care team before I take this medication? They need to know if you have any of these conditions: Infection, such as chickenpox, cold sores, or herpes Kidney disease Low blood cell levels (white cells, red cells, and platelets) Lung or breathing disease, such as asthma Radiation therapy An unusual or allergic reaction to pemetrexed, other medications, foods, dyes, or preservatives If you or your partner are pregnant or trying to get pregnant Breast-feeding How should I use  this medication? This medication is injected into a vein. It is given by your care team in a hospital or clinic setting. Talk to your care team about the use of this medication in children. Special care may be needed. Overdosage: If you think you have taken too much of this medicine contact a poison control center or emergency room at once. NOTE: This medicine is only for you. Do not share this medicine with others. What if I miss a dose? Keep appointments for follow-up doses. It is important not to miss your dose. Call your care team if you are unable to keep an appointment. What may interact with this medication? Do not take this medication with any of the following: Live virus vaccines This medication may also interact with the following: Ibuprofen This list may not describe all possible interactions. Give your health care provider a list of all the medicines, herbs, non-prescription drugs, or dietary supplements you use. Also tell them if you smoke, drink alcohol, or use illegal drugs. Some items may interact with your medicine. What should I watch for while using this medication? Your condition will  be monitored carefully while you are receiving this medication. This medication may make you feel generally unwell. This is not uncommon as chemotherapy can affect healthy cells as well as cancer cells. Report any side effects. Continue your course of treatment even though you feel ill unless your care team tells you to stop. This medication can cause serious side effects. To reduce the risk, your care team may give you other medications to take before receiving this one. Be sure to follow the directions from your care team. This medication can cause a rash or redness in areas of the body that have previously had radiation therapy. If you have had radiation therapy, tell your care team if you notice a rash in this area. This medication may increase your risk of getting an infection. Call your care  team for advice if you get a fever, chills, sore throat, or other symptoms of a cold or flu. Do not treat yourself. Try to avoid being around people who are sick. Be careful brushing or flossing your teeth or using a toothpick because you may get an infection or bleed more easily. If you have any dental work done, tell your dentist you are receiving this medication. Avoid taking medications that contain aspirin, acetaminophen, ibuprofen, naproxen, or ketoprofen unless instructed by your care team. These medications may hide a fever. Check with your care team if you have severe diarrhea, nausea, and vomiting, or if you sweat a lot. The loss of too much body fluid may make it dangerous for you to take this medication. Talk to your care team if you or your partner wish to become pregnant or think either of you might be pregnant. This medication can cause serious birth defects if taken during pregnancy and for 6 months after the last dose. A negative pregnancy test is required before starting this medication. A reliable form of contraception is recommended while taking this medication and for 6 months after the last dose. Talk to your care team about reliable forms of contraception. Do not father a child while taking this medication and for 3 months after the last dose. Use a condom while having sex during this time period. Do not breastfeed while taking this medication and for 1 week after the last dose. This medication may cause infertility. Talk to your care team if you are concerned about your fertility. What side effects may I notice from receiving this medication? Side effects that you should report to your care team as soon as possible: Allergic reactions--skin rash, itching, hives, swelling of the face, lips, tongue, or throat Dry cough, shortness of breath or trouble breathing Infection--fever, chills, cough, sore throat, wounds that don't heal, pain or trouble when passing urine, general feeling of  discomfort or being unwell Kidney injury--decrease in the amount of urine, swelling of the ankles, hands, or feet Low red blood cell level--unusual weakness or fatigue, dizziness, headache, trouble breathing Redness, blistering, peeling, or loosening of the skin, including inside the mouth Unusual bruising or bleeding Side effects that usually do not require medical attention (report to your care team if they continue or are bothersome): Fatigue Loss of appetite Nausea Vomiting This list may not describe all possible side effects. Call your doctor for medical advice about side effects. You may report side effects to FDA at 1-800-FDA-1088. Where should I keep my medication? This medication is given in a hospital or clinic. It will not be stored at home. NOTE: This sheet is a summary. It may not  cover all possible information. If you have questions about this medicine, talk to your doctor, pharmacist, or health care provider.  2023 Elsevier/Gold Standard (2021-09-03 00:00:00) Carboplatin Injection What is this medication? CARBOPLATIN (KAR boe pla tin) treats some types of cancer. It works by slowing down the growth of cancer cells. This medicine may be used for other purposes; ask your health care provider or pharmacist if you have questions. COMMON BRAND NAME(S): Paraplatin What should I tell my care team before I take this medication? They need to know if you have any of these conditions: Blood disorders Hearing problems Kidney disease Recent or ongoing radiation therapy An unusual or allergic reaction to carboplatin, cisplatin, other medications, foods, dyes, or preservatives Pregnant or trying to get pregnant Breast-feeding How should I use this medication? This medication is injected into a vein. It is given by your care team in a hospital or clinic setting. Talk to your care team about the use of this medication in children. Special care may be needed. Overdosage: If you think  you have taken too much of this medicine contact a poison control center or emergency room at once. NOTE: This medicine is only for you. Do not share this medicine with others. What if I miss a dose? Keep appointments for follow-up doses. It is important not to miss your dose. Call your care team if you are unable to keep an appointment. What may interact with this medication? Medications for seizures Some antibiotics, such as amikacin, gentamicin, neomycin, streptomycin, tobramycin Vaccines This list may not describe all possible interactions. Give your health care provider a list of all the medicines, herbs, non-prescription drugs, or dietary supplements you use. Also tell them if you smoke, drink alcohol, or use illegal drugs. Some items may interact with your medicine. What should I watch for while using this medication? Your condition will be monitored carefully while you are receiving this medication. You may need blood work while taking this medication. This medication may make you feel generally unwell. This is not uncommon, as chemotherapy can affect healthy cells as well as cancer cells. Report any side effects. Continue your course of treatment even though you feel ill unless your care team tells you to stop. In some cases, you may be given additional medications to help with side effects. Follow all directions for their use. This medication may increase your risk of getting an infection. Call your care team for advice if you get a fever, chills, sore throat, or other symptoms of a cold or flu. Do not treat yourself. Try to avoid being around people who are sick. Avoid taking medications that contain aspirin, acetaminophen, ibuprofen, naproxen, or ketoprofen unless instructed by your care team. These medications may hide a fever. Be careful brushing or flossing your teeth or using a toothpick because you may get an infection or bleed more easily. If you have any dental work done, tell your  dentist you are receiving this medication. Talk to your care team if you wish to become pregnant or think you might be pregnant. This medication can cause serious birth defects. Talk to your care team about effective forms of contraception. Do not breast-feed while taking this medication. What side effects may I notice from receiving this medication? Side effects that you should report to your care team as soon as possible: Allergic reactions--skin rash, itching, hives, swelling of the face, lips, tongue, or throat Infection--fever, chills, cough, sore throat, wounds that don't heal, pain or trouble when passing urine,  general feeling of discomfort or being unwell Low red blood cell level--unusual weakness or fatigue, dizziness, headache, trouble breathing Pain, tingling, or numbness in the hands or feet, muscle weakness, change in vision, confusion or trouble speaking, loss of balance or coordination, trouble walking, seizures Unusual bruising or bleeding Side effects that usually do not require medical attention (report to your care team if they continue or are bothersome): Hair loss Nausea Unusual weakness or fatigue Vomiting This list may not describe all possible side effects. Call your doctor for medical advice about side effects. You may report side effects to FDA at 1-800-FDA-1088. Where should I keep my medication? This medication is given in a hospital or clinic. It will not be stored at home. NOTE: This sheet is a summary. It may not cover all possible information. If you have questions about this medicine, talk to your doctor, pharmacist, or health care provider.  2023 Elsevier/Gold Standard (2021-08-21 00:00:00)

## 2022-02-20 NOTE — Progress Notes (Signed)
CrCl ~20 ml/min per MD Mohamed reduce alimta dose to 375 mg/m2  Larene Beach, PharmD

## 2022-02-25 ENCOUNTER — Encounter: Payer: Self-pay | Admitting: Orthopedic Surgery

## 2022-02-25 ENCOUNTER — Non-Acute Institutional Stay: Payer: Medicare PPO | Admitting: Orthopedic Surgery

## 2022-02-25 ENCOUNTER — Telehealth: Payer: Self-pay | Admitting: Medical Oncology

## 2022-02-25 VITALS — BP 110/72 | HR 91 | Temp 97.4°F | Resp 18 | Ht 64.0 in | Wt 103.0 lb

## 2022-02-25 DIAGNOSIS — R42 Dizziness and giddiness: Secondary | ICD-10-CM | POA: Diagnosis not present

## 2022-02-25 DIAGNOSIS — R296 Repeated falls: Secondary | ICD-10-CM

## 2022-02-25 DIAGNOSIS — I951 Orthostatic hypotension: Secondary | ICD-10-CM

## 2022-02-25 DIAGNOSIS — C3491 Malignant neoplasm of unspecified part of right bronchus or lung: Secondary | ICD-10-CM | POA: Diagnosis not present

## 2022-02-25 DIAGNOSIS — C3492 Malignant neoplasm of unspecified part of left bronchus or lung: Secondary | ICD-10-CM

## 2022-02-25 MED ORDER — METOPROLOL SUCCINATE ER 50 MG PO TB24: 25.0000 mg | ORAL_TABLET | Freq: Every day | ORAL | 3 refills | Status: DC

## 2022-02-25 NOTE — Progress Notes (Addendum)
Careteam: Patient Care Team: [name], MD as PCP - General (Internal Medicine) Debara Pickett Nadean Corwin, MD as PCP - Cardiology (Cardiology) Valrie Hart, RN as Oncology Nurse Navigator (Oncology)  Seen by: Windell Moulding, AGNP-C  PLACE OF SERVICE:  [facility] Advanced Directive information    Allergies  Allergen Reactions   Irbesartan Nausea Only    No chief complaint on file.    HPI: Patient is a 88 y.o. female seen today for acute visit due to lightheadedness.   Increased lightheadedness x 1 week. Episodes increased with changing positions. She reports falling into closet door and bruising right hand last week. Ambulating with walker more since incident. She is followed by Dr. Julien Nordmann due to stage 4 lung cancer. Recent infusion 02/20/2022. She is scheduled to be seen by them 02/28/2022. Sitting blood pressure 138/72> standing 110/72. She is on metoprolol and spironolactone. She did not take any medications this morning. Admits to not drinking fluids well. She only had a small cup of tea this morning.   Denies cold symptoms, chest pain, or sob.   Recent labs: WBC 19.9, Hgb 13.7, Hct 39.7, Na+ 129, BUN/creat 41/1.38, K+ 4.9, Calcium 10.9, AST/ALT 22/14, TSH 0.995 02/20/2022.     Review of Systems:  Review of Systems  Constitutional:  Positive for malaise/fatigue. Negative for chills, fever and weight loss.  HENT:  Negative for congestion and sore throat.   Eyes:  Negative for blurred vision and double vision.  Respiratory:  Negative for cough, shortness of breath and wheezing.   Cardiovascular:  Positive for leg swelling. Negative for chest pain.  Gastrointestinal:  Negative for abdominal pain, blood in stool, constipation, diarrhea, heartburn, nausea and vomiting.  Genitourinary:  Negative for dysuria, frequency and hematuria.  Musculoskeletal:  Positive for falls and joint pain.  Neurological:  Positive for dizziness and weakness. Negative for sensory  change and headaches.  Psychiatric/Behavioral:  Negative for depression and memory loss. The patient is not nervous/anxious and does not have insomnia.     Past Medical History:  Diagnosis Date   Bruit    Abdominal bruit - Abdominal aorta/renal duplex Doppler evaluation 12/07/03 -Mildly abnormal evaluation. 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 This is consistant w/median arcuate ligament compression syndrome. *Right & Left Kidney: Essentially equal in size, symmetrical in shape w/no significant abnormalities visualized. *Right & Left Renal Arteries: No significant abnormalities.   Cancer ([place])    Edema extremities    LE edema    H/O myocardial perfusion scan 02/20/00   To rule out ischemia - Negative adequate Bruce protocol exercise stress test with a deconditioned exercise response and normal static myocardial perfusion images with EF calculated by QGS of 77%. Represents a low risk study.   Hyperlipidemia    Hypertension    Osteoarthritis    Osteoporosis    Pleural effusion 04/2020   Valvular heart disease    Mild valvular heart disease by Echo in 2010, all mild and symptomatic, including concentric LVH, MR, TR, and AI with pulmonary artery pressure of 32. EF was normal.   Past Surgical History:  Procedure Laterality Date   Abdominal aorta/Renal duplex Doppler Evaluation  12/07/03   For abdominal bruit. Mildly abnormal evaluation. (See bruit in medical history)   BREAST EXCISIONAL BIOPSY Left 1966   BREAST EXCISIONAL BIOPSY Left 1999   CATARACT EXTRACTION  2003   CHEST TUBE INSERTION N/A 07/04/2020   Procedure: REMOVAL PLEURAL DRAINAGE CATHETER;  Surgeon: Garner Nash,  DO;  Location: [facility];  Service: Pulmonary;  Laterality: N/A;   COLONOSCOPY  10/22/2004   DEXA Bone Scan  06/23/2013   IR PERC PLEURAL DRAIN W/INDWELL CATH W/IMG GUIDE  05/10/2020   IR THORACENTESIS ASP PLEURAL SPACE W/IMG GUIDE  05/04/2020   Social History:   reports that she quit smoking  about 59 years ago. Her smoking use included cigarettes. She has a 1.50 pack-year smoking history. She has never used smokeless tobacco. She reports current alcohol use of about 2.0 standard drinks of alcohol per week. She reports that she does not use drugs.  Family History  Problem Relation Age of Onset   Leukemia Mother 77   Cancer Father        esophagial cancer   Heart failure Maternal Grandmother    Tuberculosis Maternal Grandfather    Heart disease Paternal Grandmother    Cancer Paternal Grandfather     Medications: Patient's Medications  New Prescriptions   No medications on file  Previous Medications   ACETAMINOPHEN (TYLENOL) 325 MG TABLET    Take 2 tablets (650 mg total) by mouth every 6 (six) hours as needed for mild pain (or Fever >/= 101).   AMLODIPINE (NORVASC) 2.5 MG TABLET    TAKE 1 TABLET BY MOUTH EVERY DAY   ASPIRIN 81 MG TABLET    Take 81 mg by mouth daily.   ATORVASTATIN (LIPITOR) 20 MG TABLET    TAKE 1 TABLET BY MOUTH EVERYDAY AT BEDTIME   BISACODYL (DULCOLAX) 5 MG EC TABLET    Take 5 mg by mouth daily as needed for moderate constipation.   DEXAMETHASONE (DECADRON) 4 MG TABLET    Please take 1 tablet twice a day the day before, the day of, and the day after chemotherapy   FOLIC ACID (FOLVITE) 1 MG TABLET    Take 1 tablet (1 mg total) by mouth daily.   KETOCONAZOLE (NIZORAL) 2 % SHAMPOO    Apply topically.   LOPERAMIDE (IMODIUM) 2 MG CAPSULE    Take by mouth as needed for diarrhea or loose stools.   METOPROLOL SUCCINATE (TOPROL-XL) 50 MG 24 HR TABLET    TAKE 1 TABLET BY MOUTH DAILY. TAKE WITH OR IMMEDIATELY FOLLOWING A MEAL.   MULTIPLE VITAMIN (MULTIVITAMIN) TABLET    Take 1 tablet by mouth daily.   OSIMERTINIB MESYLATE (TAGRISSO) 40 MG TABLET    Take 1 tablet (40 mg total) by mouth daily.   POLYETHYL GLYCOL-PROPYL GLYCOL (SYSTANE HYDRATION PF OP)    Apply to eye daily.   PROCHLORPERAZINE (COMPAZINE) 10 MG TABLET    Take 1 tablet (10 mg total) by mouth every 6 (six)  hours as needed.   SPIRONOLACTONE (ALDACTONE) 25 MG TABLET    Take 1 tablet (25 mg total) by mouth daily.   WHITE PETROLATUM-MINERAL OIL ([place] PETROL-MINERAL OIL-LANOLIN) 0.1-0.1 % OINT    Apply to eye as needed.  Modified Medications   No medications on file  Discontinued Medications   No medications on file    Physical Exam:  Vitals:   02/25/22 1259  BP: 138/72  Pulse: 91  Resp: 18  Temp: (!) 97.4 F (36.3 C)  SpO2: 99%  Weight: 103 lb (46.7 kg)  Height: 5\' 4"  (1.626 m)   Body mass index is 17.68 kg/m. Wt Readings from Last 3 Encounters:  02/25/22 103 lb (46.7 kg)  02/20/22 101 lb 12 oz (46.2 kg)  02/13/22 102 lb 12.8 oz (46.6 kg)    Physical Exam Vitals reviewed.  Constitutional:      Comments: Frail  HENT:     Head: Normocephalic.  Eyes:     General:        Right eye: No discharge.        Left eye: No discharge.  Cardiovascular:     Rate and Rhythm: Normal rate and regular rhythm.     Pulses: Normal pulses.     Heart sounds: Normal heart sounds.  Pulmonary:     Effort: Pulmonary effort is normal. No respiratory distress.     Breath sounds: Normal breath sounds. No wheezing or rales.  Abdominal:     General: Bowel sounds are normal. There is no distension.     Palpations: Abdomen is soft.     Tenderness: There is no abdominal tenderness.  Musculoskeletal:     Right wrist: Swelling and tenderness present. No snuff box tenderness or crepitus. Normal range of motion. Normal pulse.     Right hand: Swelling and tenderness present. Normal range of motion. Normal strength. Normal sensation. Normal pulse.     Cervical back: Neck supple.     Right lower leg: Edema present.     Left lower leg: Edema present.     Comments: Non pitting  Skin:    General: Skin is warm and dry.     Capillary Refill: Capillary refill takes less than 2 seconds.  Neurological:     General: No focal deficit present.     Mental Status: She is oriented to person, place, and time.      Motor: Weakness present.     Gait: Gait abnormal.     Comments: Walker/wheelchair  Psychiatric:        Mood and Affect: Mood normal.        Behavior: Behavior normal.     Labs reviewed: Basic Metabolic Panel: Recent Labs    12/19/21 1512 01/28/22 0949 02/20/22 1304  NA 133* 133* 129*  K 4.6 4.4 4.9  CL 102 102 99  CO2 25 23 20*  GLUCOSE 100* 116* 168*  BUN 30* 23 41*  CREATININE 1.22* 1.21* 1.38*  CALCIUM 10.5* 10.5* 10.9*  TSH  --   --  0.995   Liver Function Tests: Recent Labs    12/19/21 1512 01/28/22 0949 02/20/22 1304  AST 21 22 22   ALT 16 13 14   ALKPHOS 62 68 68  BILITOT 0.6 0.5 0.5  PROT 6.8 6.8 7.4  ALBUMIN 4.1 4.1 4.3   No results for input(s): "LIPASE", "AMYLASE" in the last 8760 hours. No results for input(s): "AMMONIA" in the last 8760 hours. CBC: Recent Labs    12/19/21 1512 01/28/22 0949 02/20/22 1304  WBC 8.6 10.4 19.9*  NEUTROABS 6.8 8.3* 19.2*  HGB 13.2 14.3 13.7  HCT 38.1 41.9 39.7  MCV 90.7 90.9 89.8  PLT 252 295 256   Lipid Panel: Recent Labs    05/21/21 0800  CHOL 151  HDL 64  LDLCALC 70  TRIG 83  CHOLHDL 2.4   TSH: Recent Labs    02/20/22 1304  TSH 0.995   A1C: No results found for: "HGBA1C"   Assessment/Plan 1. Lightheadedness - onset x 1 week - falling more- bruised right wrist/hand - orthostatic blood pressures: sitting 138/72> standing 110/72 - will reduce metoprolol - metoprolol succinate (TOPROL-XL) 50 MG 24 hr tablet; Take 0.5 tablets (25 mg total) by mouth daily. TAKE WITH OR IMMEDIATELY FOLLOWING A MEAL.  Dispense: 90 tablet; Refill: 3  2. Orthostatic hypotension - see above -  suspect dehydration as cause - advised increasing fluids intake- water/Gatorade/ avoid caffeine - f/u with oncology 02/28/2022 with labs  - metoprolol succinate (TOPROL-XL) 50 MG 24 hr tablet; Take 0.5 tablets (25 mg total) by mouth daily. TAKE WITH OR IMMEDIATELY FOLLOWING A MEAL.  Dispense: 90 tablet; Refill: 3  3. Frequent  falls - high risk for falls - PT/OT evaluation - recommend contacting comfort keepers at home  4. Malignant neoplasm of both lungs ([place]) - followed by Dr. Julien Nordmann  - cont Tagrisso daily - cont chemo- started Avastin 02/20/2022 - f/u 02/28/2022 with labs   Next appt: Visit date not found  [place], [facility] [phone]

## 2022-02-25 NOTE — Telephone Encounter (Signed)
Not feeling well. Did not leave any other information. She stated she does not know if her symptoms are related to last week infusions.  I LVM to return my call.

## 2022-02-25 NOTE — Telephone Encounter (Signed)
"  feeling Light headed , like I am going to fall.  I am off balance and not walking very well" .  BP check by RN 118/66   Provider appt today at [facility].  She has transportation for her MRI on wed.  She will call back with update after the provider visit.

## 2022-02-25 NOTE — Patient Instructions (Addendum)
Orthostatic hypotension- 138/72 sitting> 110/72 standing  Please check blood pressure twice daily  Start taking 1/2 tablet of metoprolol daily  Recommend drinking more fluids- avoid caffeine  1 Gatorade daily will help as well  Will order PT/OT evaluation for weakness  Recommend starting comfort keepers

## 2022-02-26 ENCOUNTER — Encounter (HOSPITAL_COMMUNITY): Payer: Self-pay | Admitting: Oncology

## 2022-02-26 ENCOUNTER — Emergency Department (HOSPITAL_COMMUNITY): Payer: Medicare PPO

## 2022-02-26 ENCOUNTER — Inpatient Hospital Stay (HOSPITAL_COMMUNITY): Payer: Medicare PPO

## 2022-02-26 ENCOUNTER — Inpatient Hospital Stay (HOSPITAL_COMMUNITY)
Admission: EM | Admit: 2022-02-26 | Discharge: 2022-03-02 | DRG: 065 | Disposition: A | Discharge status: Skilled Nursing Facility | Payer: Medicare PPO | Source: Skilled Nursing Facility | Attending: Internal Medicine | Admitting: Internal Medicine

## 2022-02-26 ENCOUNTER — Other Ambulatory Visit: Payer: Self-pay

## 2022-02-26 ENCOUNTER — Other Ambulatory Visit (HOSPITAL_COMMUNITY): Payer: Medicare PPO

## 2022-02-26 DIAGNOSIS — Z79899 Other long term (current) drug therapy: Secondary | ICD-10-CM | POA: Diagnosis not present

## 2022-02-26 DIAGNOSIS — Z8249 Family history of ischemic heart disease and other diseases of the circulatory system: Secondary | ICD-10-CM | POA: Diagnosis not present

## 2022-02-26 DIAGNOSIS — Z23 Encounter for immunization: Secondary | ICD-10-CM

## 2022-02-26 DIAGNOSIS — Z888 Allergy status to other drugs, medicaments and biological substances status: Secondary | ICD-10-CM

## 2022-02-26 DIAGNOSIS — Z87891 Personal history of nicotine dependence: Secondary | ICD-10-CM

## 2022-02-26 DIAGNOSIS — R29898 Other symptoms and signs involving the musculoskeletal system: Secondary | ICD-10-CM | POA: Diagnosis not present

## 2022-02-26 DIAGNOSIS — Z8673 Personal history of transient ischemic attack (TIA), and cerebral infarction without residual deficits: Secondary | ICD-10-CM | POA: Diagnosis present

## 2022-02-26 DIAGNOSIS — R7303 Prediabetes: Secondary | ICD-10-CM | POA: Diagnosis present

## 2022-02-26 DIAGNOSIS — I959 Hypotension, unspecified: Secondary | ICD-10-CM | POA: Diagnosis not present

## 2022-02-26 DIAGNOSIS — I2489 Other forms of acute ischemic heart disease: Secondary | ICD-10-CM | POA: Diagnosis not present

## 2022-02-26 DIAGNOSIS — I493 Ventricular premature depolarization: Secondary | ICD-10-CM | POA: Diagnosis not present

## 2022-02-26 DIAGNOSIS — D849 Immunodeficiency, unspecified: Secondary | ICD-10-CM | POA: Diagnosis present

## 2022-02-26 DIAGNOSIS — K219 Gastro-esophageal reflux disease without esophagitis: Secondary | ICD-10-CM | POA: Diagnosis present

## 2022-02-26 DIAGNOSIS — M810 Age-related osteoporosis without current pathological fracture: Secondary | ICD-10-CM | POA: Diagnosis present

## 2022-02-26 DIAGNOSIS — C3492 Malignant neoplasm of unspecified part of left bronchus or lung: Secondary | ICD-10-CM | POA: Diagnosis not present

## 2022-02-26 DIAGNOSIS — I6389 Other cerebral infarction: Secondary | ICD-10-CM | POA: Diagnosis not present

## 2022-02-26 DIAGNOSIS — E875 Hyperkalemia: Secondary | ICD-10-CM | POA: Diagnosis present

## 2022-02-26 DIAGNOSIS — E860 Dehydration: Secondary | ICD-10-CM | POA: Diagnosis present

## 2022-02-26 DIAGNOSIS — Z66 Do not resuscitate: Secondary | ICD-10-CM | POA: Diagnosis not present

## 2022-02-26 DIAGNOSIS — R531 Weakness: Secondary | ICD-10-CM

## 2022-02-26 DIAGNOSIS — E785 Hyperlipidemia, unspecified: Secondary | ICD-10-CM | POA: Diagnosis present

## 2022-02-26 DIAGNOSIS — Z1152 Encounter for screening for COVID-19: Secondary | ICD-10-CM | POA: Diagnosis not present

## 2022-02-26 DIAGNOSIS — R5383 Other fatigue: Secondary | ICD-10-CM | POA: Diagnosis not present

## 2022-02-26 DIAGNOSIS — J910 Malignant pleural effusion: Secondary | ICD-10-CM | POA: Diagnosis present

## 2022-02-26 DIAGNOSIS — R29700 NIHSS score 0: Secondary | ICD-10-CM | POA: Diagnosis present

## 2022-02-26 DIAGNOSIS — I639 Cerebral infarction, unspecified: Secondary | ICD-10-CM | POA: Diagnosis present

## 2022-02-26 DIAGNOSIS — C7931 Secondary malignant neoplasm of brain: Secondary | ICD-10-CM | POA: Diagnosis present

## 2022-02-26 DIAGNOSIS — I951 Orthostatic hypotension: Secondary | ICD-10-CM | POA: Diagnosis present

## 2022-02-26 DIAGNOSIS — Z299 Encounter for prophylactic measures, unspecified: Secondary | ICD-10-CM | POA: Diagnosis not present

## 2022-02-26 DIAGNOSIS — Z7982 Long term (current) use of aspirin: Secondary | ICD-10-CM | POA: Diagnosis not present

## 2022-02-26 DIAGNOSIS — Z806 Family history of leukemia: Secondary | ICD-10-CM

## 2022-02-26 DIAGNOSIS — I63442 Cerebral infarction due to embolism of left cerebellar artery: Principal | ICD-10-CM | POA: Diagnosis present

## 2022-02-26 DIAGNOSIS — I10 Essential (primary) hypertension: Secondary | ICD-10-CM | POA: Diagnosis not present

## 2022-02-26 DIAGNOSIS — E782 Mixed hyperlipidemia: Secondary | ICD-10-CM | POA: Diagnosis not present

## 2022-02-26 DIAGNOSIS — J90 Pleural effusion, not elsewhere classified: Secondary | ICD-10-CM | POA: Diagnosis not present

## 2022-02-26 DIAGNOSIS — D696 Thrombocytopenia, unspecified: Secondary | ICD-10-CM | POA: Diagnosis not present

## 2022-02-26 DIAGNOSIS — K5900 Constipation, unspecified: Secondary | ICD-10-CM | POA: Diagnosis present

## 2022-02-26 DIAGNOSIS — E871 Hypo-osmolality and hyponatremia: Secondary | ICD-10-CM | POA: Diagnosis not present

## 2022-02-26 DIAGNOSIS — R220 Localized swelling, mass and lump, head: Secondary | ICD-10-CM | POA: Diagnosis not present

## 2022-02-26 DIAGNOSIS — Z7401 Bed confinement status: Secondary | ICD-10-CM | POA: Diagnosis not present

## 2022-02-26 DIAGNOSIS — G9389 Other specified disorders of brain: Secondary | ICD-10-CM | POA: Diagnosis not present

## 2022-02-26 LAB — URINALYSIS, ROUTINE W REFLEX MICROSCOPIC
Bacteria, UA: NONE SEEN
Bilirubin Urine: NEGATIVE
Glucose, UA: NEGATIVE mg/dL
Ketones, ur: NEGATIVE mg/dL
Nitrite: NEGATIVE
Protein, ur: NEGATIVE mg/dL
Specific Gravity, Urine: 1.01 (ref 1.005–1.030)
pH: 6 (ref 5.0–8.0)

## 2022-02-26 LAB — CBC WITH DIFFERENTIAL/PLATELET
Abs Immature Granulocytes: 0.03 10*3/uL (ref 0.00–0.07)
Basophils Absolute: 0 10*3/uL (ref 0.0–0.1)
Basophils Relative: 0 %
Eosinophils Absolute: 0.2 10*3/uL (ref 0.0–0.5)
Eosinophils Relative: 2 %
HCT: 37.7 % (ref 36.0–46.0)
Hemoglobin: 12.7 g/dL (ref 12.0–15.0)
Immature Granulocytes: 0 %
Lymphocytes Relative: 5 %
Lymphs Abs: 0.4 10*3/uL — ABNORMAL LOW (ref 0.7–4.0)
MCH: 30.8 pg (ref 26.0–34.0)
MCHC: 33.7 g/dL (ref 30.0–36.0)
MCV: 91.5 fL (ref 80.0–100.0)
Monocytes Absolute: 0.1 10*3/uL (ref 0.1–1.0)
Monocytes Relative: 1 %
Neutro Abs: 7.4 10*3/uL (ref 1.7–7.7)
Neutrophils Relative %: 92 %
Platelets: 149 10*3/uL — ABNORMAL LOW (ref 150–400)
RBC: 4.12 MIL/uL (ref 3.87–5.11)
RDW: 14.8 % (ref 11.5–15.5)
WBC: 8.1 10*3/uL (ref 4.0–10.5)
nRBC: 0 % (ref 0.0–0.2)

## 2022-02-26 LAB — COMPREHENSIVE METABOLIC PANEL
ALT: 18 U/L (ref 0–44)
AST: 25 U/L (ref 15–41)
Albumin: 3.3 g/dL — ABNORMAL LOW (ref 3.5–5.0)
Alkaline Phosphatase: 55 U/L (ref 38–126)
Anion gap: 5 (ref 5–15)
BUN: 41 mg/dL — ABNORMAL HIGH (ref 8–23)
CO2: 22 mmol/L (ref 22–32)
Calcium: 9.8 mg/dL (ref 8.9–10.3)
Chloride: 103 mmol/L (ref 98–111)
Creatinine, Ser: 1.05 mg/dL — ABNORMAL HIGH (ref 0.44–1.00)
GFR, Estimated: 51 mL/min — ABNORMAL LOW (ref >60)
Glucose, Bld: 116 mg/dL — ABNORMAL HIGH (ref 70–99)
Potassium: 5.3 mmol/L — ABNORMAL HIGH (ref 3.5–5.1)
Sodium: 130 mmol/L — ABNORMAL LOW (ref 135–145)
Total Bilirubin: 0.8 mg/dL (ref 0.3–1.2)
Total Protein: 5.8 g/dL — ABNORMAL LOW (ref 6.5–8.1)

## 2022-02-26 LAB — HEMOGLOBIN A1C
Hgb A1c MFr Bld: 5.7 % — ABNORMAL HIGH (ref 4.8–5.6)
Mean Plasma Glucose: 116.89 mg/dL

## 2022-02-26 LAB — RESP PANEL BY RT-PCR (FLU A&B, COVID) ARPGX2
Influenza A by PCR: NEGATIVE
Influenza B by PCR: NEGATIVE
SARS Coronavirus 2 by RT PCR: NEGATIVE

## 2022-02-26 LAB — POTASSIUM: Potassium: 4.8 mmol/L (ref 3.5–5.1)

## 2022-02-26 LAB — STREP PNEUMONIAE URINARY ANTIGEN: Strep Pneumo Urinary Antigen: NEGATIVE

## 2022-02-26 LAB — TROPONIN I (HIGH SENSITIVITY)
Troponin I (High Sensitivity): 32 ng/L — ABNORMAL HIGH (ref <18)
Troponin I (High Sensitivity): 31 ng/L — ABNORMAL HIGH (ref <18)

## 2022-02-26 MED ORDER — ACETAMINOPHEN 325 MG PO TABS: 650.0000 mg | ORAL_TABLET | ORAL | Status: DC | PRN

## 2022-02-26 MED ORDER — INFLUENZA VAC A&B SA ADJ QUAD 0.5 ML IM PRSY
0.5000 mL | PREFILLED_SYRINGE | INTRAMUSCULAR | Status: AC
  Administered 2022-02-27: 0.5 mL via INTRAMUSCULAR
  Filled 2022-02-26: qty 0.5

## 2022-02-26 MED ORDER — ACETAMINOPHEN 650 MG RE SUPP: 650.0000 mg | RECTAL | Status: DC | PRN

## 2022-02-26 MED ORDER — ATORVASTATIN CALCIUM 20 MG PO TABS
20.0000 mg | ORAL_TABLET | Freq: Every day | ORAL | Status: DC
  Administered 2022-02-26 – 2022-03-01 (×4): 20 mg via ORAL
  Filled 2022-02-26 (×3): qty 1
  Filled 2022-02-26: qty 2

## 2022-02-26 MED ORDER — CLOPIDOGREL BISULFATE 75 MG PO TABS
75.0000 mg | ORAL_TABLET | Freq: Every day | ORAL | Status: DC
  Administered 2022-02-26 – 2022-03-02 (×5): 75 mg via ORAL
  Filled 2022-02-26 (×5): qty 1

## 2022-02-26 MED ORDER — SODIUM CHLORIDE 0.9 % IV SOLN
500.0000 mg | INTRAVENOUS | Status: DC
  Administered 2022-02-26: 500 mg via INTRAVENOUS
  Filled 2022-02-26: qty 5

## 2022-02-26 MED ORDER — ADULT MULTIVITAMIN W/MINERALS CH
1.0000 | ORAL_TABLET | Freq: Every day | ORAL | Status: DC
  Administered 2022-02-28 – 2022-03-02 (×3): 1 via ORAL
  Filled 2022-02-26 (×3): qty 1

## 2022-02-26 MED ORDER — ASPIRIN 81 MG PO TBEC
81.0000 mg | DELAYED_RELEASE_TABLET | Freq: Every day | ORAL | Status: DC
  Administered 2022-02-26 – 2022-03-02 (×5): 81 mg via ORAL
  Filled 2022-02-26 (×5): qty 1

## 2022-02-26 MED ORDER — SENNOSIDES-DOCUSATE SODIUM 8.6-50 MG PO TABS: 1.0000 | ORAL_TABLET | Freq: Every evening | ORAL | Status: DC | PRN

## 2022-02-26 MED ORDER — PROCHLORPERAZINE MALEATE 10 MG PO TABS: 10.0000 mg | ORAL_TABLET | Freq: Four times a day (QID) | ORAL | Status: DC | PRN

## 2022-02-26 MED ORDER — PANTOPRAZOLE SODIUM 40 MG PO TBEC
40.0000 mg | DELAYED_RELEASE_TABLET | Freq: Every day | ORAL | Status: DC
  Administered 2022-02-26 – 2022-03-02 (×4): 40 mg via ORAL
  Filled 2022-02-26 (×4): qty 1

## 2022-02-26 MED ORDER — IOHEXOL 350 MG/ML SOLN
75.0000 mL | Freq: Once | INTRAVENOUS | Status: AC | PRN
  Administered 2022-02-26: 75 mL via INTRAVENOUS

## 2022-02-26 MED ORDER — FOLIC ACID 1 MG PO TABS
1.0000 mg | ORAL_TABLET | Freq: Every day | ORAL | Status: DC
  Administered 2022-02-27 – 2022-03-02 (×4): 1 mg via ORAL
  Filled 2022-02-26 (×4): qty 1

## 2022-02-26 MED ORDER — ONDANSETRON HCL 4 MG/2ML IJ SOLN: 4.0000 mg | Freq: Four times a day (QID) | INTRAMUSCULAR | Status: DC | PRN

## 2022-02-26 MED ORDER — BISACODYL 5 MG PO TBEC: 5.0000 mg | DELAYED_RELEASE_TABLET | Freq: Every day | ORAL | Status: DC | PRN

## 2022-02-26 MED ORDER — ASPIRIN 300 MG RE SUPP: 300.0000 mg | Freq: Every day | RECTAL | Status: DC

## 2022-02-26 MED ORDER — STROKE: EARLY STAGES OF RECOVERY BOOK
Freq: Once | Status: DC
  Filled 2022-02-26: qty 1

## 2022-02-26 MED ORDER — SODIUM CHLORIDE 0.9 % IV BOLUS
500.0000 mL | Freq: Once | INTRAVENOUS | Status: AC
  Administered 2022-02-26: 500 mL via INTRAVENOUS

## 2022-02-26 MED ORDER — SODIUM CHLORIDE 0.9 % IV SOLN: INTRAVENOUS | Status: AC

## 2022-02-26 MED ORDER — GADOBUTROL 1 MMOL/ML IV SOLN
5.0000 mL | Freq: Once | INTRAVENOUS | Status: AC | PRN
  Administered 2022-02-26: 5 mL via INTRAVENOUS

## 2022-02-26 MED ORDER — ENOXAPARIN SODIUM 30 MG/0.3ML IJ SOSY
30.0000 mg | PREFILLED_SYRINGE | INTRAMUSCULAR | Status: DC
  Administered 2022-02-26 – 2022-03-01 (×4): 30 mg via SUBCUTANEOUS
  Filled 2022-02-26 (×4): qty 0.3

## 2022-02-26 MED ORDER — ACETAMINOPHEN 160 MG/5ML PO SOLN: 650.0000 mg | ORAL | Status: DC | PRN

## 2022-02-26 MED ORDER — ASPIRIN 81 MG PO TABS: 81.0000 mg | ORAL_TABLET | Freq: Every day | ORAL | Status: DC

## 2022-02-26 MED ORDER — SODIUM CHLORIDE 0.9 % IV SOLN
2.0000 g | INTRAVENOUS | Status: DC
  Administered 2022-02-26 – 2022-03-01 (×4): 2 g via INTRAVENOUS
  Filled 2022-02-26 (×4): qty 20

## 2022-02-26 MED ORDER — LOPERAMIDE HCL 2 MG PO CAPS: 2.0000 mg | ORAL_CAPSULE | ORAL | Status: DC | PRN

## 2022-02-26 MED ORDER — ASPIRIN 325 MG PO TABS: 325.0000 mg | ORAL_TABLET | Freq: Every day | ORAL | Status: DC

## 2022-02-26 NOTE — ED Notes (Signed)
Pt ambulatory to restroom with 1 assist.

## 2022-02-26 NOTE — ED Triage Notes (Signed)
Pt bib [facility] from [facility] d/t weakness that began after receiving IV chemotherapy.

## 2022-02-26 NOTE — H&P (Signed)
History and Physical    Shelley Flores DOB: [birth year] DOA: 02/26/2022  PCP: [name], MD  Patient coming from: Independent living/friends home  I have personally briefly reviewed patient's old medical records in [place]  Chief Complaint: Generalized weakness, gait abnormalities  HPI: Shelley Flores is a 88 y.o. female with medical history significant of stage IV lung cancer, recent chemotherapy 7 days prior to admission previously was on [place] per patient, hypertension, hyperlipidemia presenting to the ED with a 6-day history of generalized weakness, gait abnormalities with increased wobbliness having to hold onto things as she moved around and felt like she was going to lose her balance.  Patient does endorse some lightheadedness.  Patient denies any spinning sensations.  Patient also endorses some fatigue.  Patient denies any chest pain, no shortness of breath, no fever, no chills, no nausea, no vomiting, no abdominal pain, no diarrhea, no melena, no hematemesis, no hematochezia.  Patient denies any syncopal episodes.  Patient denies any slurred speech, no visual difficulties, no asymmetric weakness or numbness.  Patient does endorse constipation.  Patient states was post to have an MRI done tomorrow.  ED Course: Patient seen in the ED, CBC done with a platelet count of 149 otherwise unremarkable.  Comprehensive metabolic profile with a sodium of 130, potassium of 5.3, glucose of 116, BUN of 41, creatinine of 1.05, albumin of 3.3, protein of 5.8 otherwise within normal limits.  High-sensitivity troponin noted at 32.  Urinalysis with small hemoglobin, negative ketones, trace leukocytes, nitrite negative, 0-5 WBCs.  Chest x-ray done with small to moderate left pleural effusion.  Increased markings in the left lower lung fields suggest possible interstitial pneumonia.  MRI brain done with small acute infarct in the left cerebellum.  Slight edema but no mass  effect.  Similar versus slightly decreased conspicuity of a tiny treated lesion in the posterior right temporal lobe.  No progressive disease.  Unchanged extra-axial 1.4 cm lesion along the right chronic temporal convexity probably meningioma.  EDP spoke with neuro hospitalist and recommendation was admission to [facility] hospital for stroke work-up.  Review of Systems: As per HPI otherwise all other systems reviewed and are negative.  Past Medical History:  Diagnosis Date   Bruit    Abdominal bruit - Abdominal aorta/renal duplex Doppler evaluation 12/07/03 -Mildly abnormal evaluation. 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 This is consistant w/median arcuate ligament compression syndrome. *Right & Left Kidney: Essentially equal in size, symmetrical in shape w/no significant abnormalities visualized. *Right & Left Renal Arteries: No significant abnormalities.   Cancer ([place])    Edema extremities    LE edema    H/O myocardial perfusion scan 02/20/00   To rule out ischemia - Negative adequate Bruce protocol exercise stress test with a deconditioned exercise response and normal static myocardial perfusion images with EF calculated by QGS of 77%. Represents a low risk study.   Hyperlipidemia    Hypertension    Osteoarthritis    Osteoporosis    Pleural effusion 04/2020   Valvular heart disease    Mild valvular heart disease by Echo in 2010, all mild and symptomatic, including concentric LVH, MR, TR, and AI with pulmonary artery pressure of 32. EF was normal.    Past Surgical History:  Procedure Laterality Date   Abdominal aorta/Renal duplex Doppler Evaluation  12/07/03   For abdominal bruit. Mildly abnormal evaluation. (See bruit in medical history)   BREAST EXCISIONAL BIOPSY Left 1966   BREAST  EXCISIONAL BIOPSY Left 1999   CATARACT EXTRACTION  2003   CHEST TUBE INSERTION N/A 07/04/2020   Procedure: REMOVAL PLEURAL DRAINAGE CATHETER;  Surgeon: Garner Nash, DO;  Location: [place];  Service: Pulmonary;  Laterality: N/A;   COLONOSCOPY  10/22/2004   DEXA Bone Scan  06/23/2013   IR PERC PLEURAL DRAIN W/INDWELL CATH W/IMG GUIDE  05/10/2020   IR THORACENTESIS ASP PLEURAL SPACE W/IMG GUIDE  05/04/2020    Social History  reports that she quit smoking about 59 years ago. Her smoking use included cigarettes. She has a 1.50 pack-year smoking history. She has never used smokeless tobacco. She reports current alcohol use of about 2.0 standard drinks of alcohol per week. She reports that she does not use drugs.  Allergies  Allergen Reactions   Irbesartan Nausea Only    Family History  Problem Relation Age of Onset   Leukemia Mother 44   Cancer Father        esophagial cancer   Heart failure Maternal Grandmother    Tuberculosis Maternal Grandfather    Heart disease Paternal Grandmother    Cancer Paternal Grandfather    Mother deceased age 80 from Hodgkin's disease.  Father deceased age 9 from esophageal tumor.  Prior to Admission medications   Medication Sig Start Date End Date Taking? Authorizing Provider  acetaminophen (TYLENOL) 325 MG tablet Take 2 tablets (650 mg total) by mouth every 6 (six) hours as needed for mild pain (or Fever >/= 101). 05/11/20   Sheikh, Omair Latif, DO  amLODipine (NORVASC) 2.5 MG tablet TAKE 1 TABLET BY MOUTH EVERY DAY 09/25/21   [name], MD  aspirin 81 MG tablet Take 81 mg by mouth daily.    [provider]  atorvastatin (LIPITOR) 20 MG tablet TAKE 1 TABLET BY MOUTH EVERYDAY AT BEDTIME 11/08/21   Hilty, Nadean Corwin, MD  bisacodyl (DULCOLAX) 5 MG EC tablet Take 5 mg by mouth daily as needed for moderate constipation.    [provider]  dexamethasone (DECADRON) 4 MG tablet Please take 1 tablet twice a day the day before, the day of, and the day after chemotherapy 02/13/22   Heilingoetter, Cassandra L, PA-C  folic acid (FOLVITE) 1 MG tablet Take 1 tablet (1 mg total) by mouth daily. 02/13/22   Heilingoetter,  Cassandra L, PA-C  ketoconazole (NIZORAL) 2 % shampoo Apply topically. 07/12/21   [provider]  loperamide (IMODIUM) 2 MG capsule Take by mouth as needed for diarrhea or loose stools.    [provider]  metoprolol succinate (TOPROL-XL) 50 MG 24 hr tablet Take 0.5 tablets (25 mg total) by mouth daily. TAKE WITH OR IMMEDIATELY FOLLOWING A MEAL. 02/25/22   Fargo, Amy E, NP  Multiple Vitamin (MULTIVITAMIN) tablet Take 1 tablet by mouth daily.    [provider]  osimertinib mesylate (TAGRISSO) 40 MG tablet Take 1 tablet (40 mg total) by mouth daily. 02/08/22   Heilingoetter, Cassandra L, PA-C  Polyethyl Glycol-Propyl Glycol (SYSTANE HYDRATION PF OP) Apply to eye daily.    [provider]  prochlorperazine (COMPAZINE) 10 MG tablet Take 1 tablet (10 mg total) by mouth every 6 (six) hours as needed. 02/13/22   Heilingoetter, Cassandra L, PA-C  spironolactone (ALDACTONE) 25 MG tablet Take 1 tablet (25 mg total) by mouth daily. 07/05/21 11/21/21  Hilty, Nadean Corwin, MD  White Petrolatum-Mineral Oil [facility] PETROL-MINERAL OIL-LANOLIN) 0.1-0.1 % OINT Apply to eye as needed.    [provider]    Physical  Exam: Vitals:   02/26/22 1325 02/26/22 1504 02/26/22 1545 02/26/22 1600  BP: (!) 143/74 (!) 149/89 139/69 (!) 144/77  Pulse: 98 89 86 85  Resp: (!) 22 18  20   Temp: 97.8 F (36.6 C) 98 F (36.7 C)    TempSrc: Oral Oral    SpO2: 100% 100% 100% 100%    Constitutional: NAD, calm, comfortable Vitals:   02/26/22 1325 02/26/22 1504 02/26/22 1545 02/26/22 1600  BP: (!) 143/74 (!) 149/89 139/69 (!) 144/77  Pulse: 98 89 86 85  Resp: (!) 22 18  20   Temp: 97.8 F (36.6 C) 98 F (36.7 C)    TempSrc: Oral Oral    SpO2: 100% 100% 100% 100%   Eyes: PERRL, lids and conjunctivae normal ENMT: Mucous membranes are dry. Posterior pharynx clear of any exudate or lesions.Normal dentition.  Neck: normal, supple, no masses, no thyromegaly Respiratory: clear to auscultation  bilaterally, no wheezing, no crackles. Normal respiratory effort. No accessory muscle use.  Cardiovascular: Regular rate and rhythm, no murmurs / rubs / gallops. No extremity edema. 2+ pedal pulses. No carotid bruits.  Abdomen: no tenderness, no masses palpated. No hepatosplenomegaly. Bowel sounds positive.  Musculoskeletal: no clubbing / cyanosis. No joint deformity upper and lower extremities. Good ROM, no contractures. Normal muscle tone.  Skin: no rashes, lesions, ulcers. No induration Neurologic: CN 2-12 grossly intact. Sensation intact, unable to elicit reflexes symmetrically and diffusely.  5/5 bilateral upper extremity strength.  5/5 bilateral lower extremity strength.  Finger-to-nose intact.  Patient noted to be walking gingerly in the hallway from the bathroom back to her room in the ED holding onto the wall.  Psychiatric: Normal judgment and insight. Alert and oriented x 3. Normal mood.    Labs on Admission: I have personally reviewed following labs and imaging studies  CBC: Recent Labs  Lab 02/20/22 1304 02/26/22 1351  WBC 19.9* 8.1  NEUTROABS 19.2* 7.4  HGB 13.7 12.7  HCT 39.7 37.7  MCV 89.8 91.5  PLT 256 149*    Basic Metabolic Panel: Recent Labs  Lab 02/20/22 1304 02/26/22 1351  NA 129* 130*  K 4.9 5.3*  CL 99 103  CO2 20* 22  GLUCOSE 168* 116*  BUN 41* 41*  CREATININE 1.38* 1.05*  CALCIUM 10.9* 9.8    GFR: Estimated Creatinine Clearance: 27.8 mL/min (A) (by C-G formula based on SCr of 1.05 mg/dL (H)).  Liver Function Tests: Recent Labs  Lab 02/20/22 1304 02/26/22 1351  AST 22 25  ALT 14 18  ALKPHOS 68 55  BILITOT 0.5 0.8  PROT 7.4 5.8*  ALBUMIN 4.3 3.3*    Urine analysis:    Component Value Date/Time   COLORURINE YELLOW 02/26/2022 1636   APPEARANCEUR CLEAR 02/26/2022 1636   LABSPEC 1.010 02/26/2022 1636   PHURINE 6.0 02/26/2022 1636   GLUCOSEU NEGATIVE 02/26/2022 1636   HGBUR SMALL (A) 02/26/2022 1636   BILIRUBINUR NEGATIVE 02/26/2022  1636   KETONESUR NEGATIVE 02/26/2022 1636   PROTEINUR NEGATIVE 02/26/2022 1636   NITRITE NEGATIVE 02/26/2022 1636   LEUKOCYTESUR TRACE (A) 02/26/2022 1636    Radiological Exams on Admission: MR Brain W and Wo Contrast  Result Date: 02/26/2022 CLINICAL DATA:  Brain/CNS neoplasm, assess treatment response EXAM: MRI HEAD WITHOUT AND WITH CONTRAST TECHNIQUE: Multiplanar, multiecho pulse sequences of the brain and surrounding structures were obtained without and with intravenous contrast. CONTRAST:  65mL GADAVIST GADOBUTROL 1 MMOL/ML IV SOLN COMPARISON:  MRI August 14, 2020. FINDINGS: Brain: Similar size of an extra-axial dural-based  lesion along the right frontoparietal convexity, which measures 1.4 cm on series 16, image 143. Similar versus slightly decreased conspicuity of a tiny lesion in the posterior temporal lobe (series 16, image 104) with faint susceptibility artifact (series 11, image 33). Otherwise, no abnormal enhancement. No new lesions identified. 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 Slight edema but no mass effect. No evidence of acute hemorrhage or midline shift. No hydrocephalus. Vascular: Major arterial flow voids are maintained at the skull base. Skull and upper cervical spine: Normal marrow signal. Sinuses/Orbits: Clear sinuses.  No acute orbital findings. Other: No mastoid effusions. IMPRESSION: 1. 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 Slight edema but no mass effect. 2. Similar versus slightly decreased conspicuity of a tiny treated lesion the posterior right temporal lobe. No progressive disease. 3. Unchanged extra-axial 1.4 cm lesion along the right chronic temporal convexity, probably a meningioma. Electronically Signed   By: Margaretha Sheffield M.D.   On: 02/26/2022 15:07   DG Chest Port 1 View  Result Date: 02/26/2022 CLINICAL DATA:  Generalized weakness EXAM: PORTABLE CHEST 1 VIEW COMPARISON:  06/30/2020 FINDINGS: Cardiac size is within normal  limits. Low position of diaphragms suggests COPD. Small to moderate left pleural effusion is seen. There is interval removal of left chest tube. Increased interstitial markings are seen in left lower lung field. There is no focal consolidation. There is no significant right pleural effusion. There is no pneumothorax. IMPRESSION: Small to moderate left pleural effusion. Increased markings in left lower lung fields suggest possible interstitial pneumonia. Electronically Signed   By: Elmer Picker M.D.   On: 02/26/2022 14:14    EKG: Independently reviewed.  Sinus rhythm.  Q waves in 3 and aVF.  Assessment/Plan Principal Problem:   Cerebellar stroke, acute (HCC) Active Problems:   Essential hypertension   Hyperlipidemia   PVC's (premature ventricular contractions)   Hyponatremia   GERD (gastroesophageal reflux disease)   Adenocarcinoma of left lung, stage 4 (HCC)     #1 acute left cerebellar CVA -Patient with complaints of generalized weakness, gait abnormalities, recently received IV chemotherapy for stage IV lung cancer 1 week prior to admission. -Patient with no urinary symptoms. -Patient was supposed to get MRI of the brain done 02/27/2022 however was done in the ED and patient noted to have an acute small left cerebellar stroke. -EDP spoke with neuro hospitalist and recommendation was for patient to be admitted to [facility] for stroke work-up. -We will admit patient to [facility], check a CT angiogram head and neck, check a 2D echo, PT/OT/SLP. -Swallow evaluation pending. -Check a hemoglobin A1c, fasting lipid panel. -Permissive hypertension. -Normal saline 75 cc an hour x2 days. -Patient noted to have been on a baby aspirin prior to admission and will place on aspirin and Plavix for secondary stroke prophylaxis. -Continue home regimen statin, if LDL is greater than 70 will increase dose of statin from 20 mg to 40 mg daily. -Neurology consulted for further  evaluation and management.  2.  Dehydration -Fluids.  3.  Hypertension -Hold antihypertensive medications for permissive hypertension secondary to problem #1.  4.  Stage IV lung cancer -On chemotherapy, recent dose 7 days prior to admission. -We will inform patient's primary oncologist, Dr. Lorna Few of admission via epic.  5.  Hyperkalemia -Repeat labs.  6.  Chronic hyponatremia -Currently stable. -Gentle hydration with IV fluids. -Repeat labs in AM.  7.  Hyperlipidemia -Check fasting lipid panel. -Statin.  8.  GERD -PPI.  9.  Abnormal chest x-ray -Chest x-ray with small to moderate left pleural effusion.  Increased markings in the left lower lung field suggestive of possible interstitial pneumonia. -Patient currently afebrile, normal white count, no respiratory symptoms. -Patient immunocompromised with stage IV lung cancer on IV chemotherapy. -Check a urine Legionella antigen, urine pneumococcus antigen. -Due to immunocompromise status we will place empirically on IV Rocephin, IV azithromycin to complete a 5-day course of treatment.   DVT prophylaxis: Lovenox Code Status:   DNR Family Communication:  Updated patient.  No family at bedside. Disposition Plan:   Patient is from:  Independent living at friend's home  Anticipated DC to:  Back to friend's home  Anticipated DC date:  2 to 3 days  Anticipated DC barriers: Clinical improvement Consults called:  Neurology by EDP Admission status:  Telemetry  Severity of Illness: The appropriate patient status for this patient is INPATIENT. Inpatient status is judged to be reasonable and necessary in order to provide the required intensity of service to ensure the patient's safety. The patient's presenting symptoms, physical exam findings, and initial radiographic and laboratory data in the context of their chronic comorbidities is felt to place them at high risk for further clinical deterioration. Furthermore, it is not  anticipated that the patient will be medically stable for discharge from the hospital within 2 midnights of admission.   * I certify that at the point of admission it is my clinical judgment that the patient will require inpatient hospital care spanning beyond 2 midnights from the point of admission due to high intensity of service, high risk for further deterioration and high frequency of surveillance required.*    Irine Seal MD [facility]  How to contact the [facility] Attending or Consulting provider [place] or covering provider during after hours [place], for this patient?   Check the care team in [facility] and look for a) attending/consulting [facility] provider listed and b) the [facility] team listed Log into [email or web address] and use [place]'s universal password to access. If you do not have the password, please contact the hospital operator. Locate the [facility] provider you are looking for under [facility] and page to a number that you can be directly reached. If you still have difficulty reaching the provider, please page the [facility] (Director on Call) for the Hospitalists listed on amion for assistance.  02/26/2022, 5:00 PM

## 2022-02-26 NOTE — Consult Note (Addendum)
Neurology Consultation  Reason for Consult: stroke on MRI brain  Referring Physician: Dr. Grandville Silos   CC: generalized weakness and gait abnormalities  History is obtained from:patient and medical record   HPI: Shelley Flores is a 88 y.o. female with past medical history of stage IV lung cancer, recent chemotherapy 7 days prior to admission previously was on [place] per patient, hypertension, hyperlipidemia presenting to the ED with a 6-day history of generalized weakness, gait abnormalities with increased wobbliness having to hold onto things as she moved around and felt like she was going to lose her balance. Symptoms started about a week ago. Denies facial droop, slurred speech, weakness, numbness or tingling. Neurology consulted   LKW: 6 days ago  IV thrombolysis given?: no, outside window Premorbid modified Rankin scale (mRS): 1-No significant post stroke disability and can perform usual duties with stroke symptoms  ROS: Full ROS was performed and is negative except as noted in the HPI.    Past Medical History:  Diagnosis Date   Bruit    Abdominal bruit - Abdominal aorta/renal duplex Doppler evaluation 12/07/03 -Mildly abnormal evaluation. 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 This is consistant w/median arcuate ligament compression syndrome. *Right & Left Kidney: Essentially equal in size, symmetrical in shape w/no significant abnormalities visualized. *Right & Left Renal Arteries: No significant abnormalities.   Cancer ([place])    Edema extremities    LE edema    H/O myocardial perfusion scan 02/20/00   To rule out ischemia - Negative adequate Bruce protocol exercise stress test with a deconditioned exercise response and normal static myocardial perfusion images with EF calculated by QGS of 77%. Represents a low risk study.   Hyperlipidemia    Hypertension    Osteoarthritis    Osteoporosis    Pleural effusion 04/2020   Valvular heart disease    Mild valvular  heart disease by Echo in 2010, all mild and symptomatic, including concentric LVH, MR, TR, and AI with pulmonary artery pressure of 32. EF was normal.     Family History  Problem Relation Age of Onset   Leukemia Mother 69   Cancer Father        esophagial cancer   Heart failure Maternal Grandmother    Tuberculosis Maternal Grandfather    Heart disease Paternal Grandmother    Cancer Paternal Grandfather      Social History:   reports that she quit smoking about 59 years ago. Her smoking use included cigarettes. She has a 1.50 pack-year smoking history. She has never used smokeless tobacco. She reports current alcohol use of about 2.0 standard drinks of alcohol per week. She reports that she does not use drugs.  Medications  Current Facility-Administered Medications:    [START ON 02/27/2022]  stroke: early stages of recovery book, , Does not apply, Once, Eugenie Filler, MD   0.9 %  sodium chloride infusion, , Intravenous, Continuous, Eugenie Filler, MD, Last Rate: 75 mL/hr at 02/26/22 1745, New Bag at 02/26/22 1745   acetaminophen (TYLENOL) tablet 650 mg, 650 mg, Oral, Q4H PRN **OR** acetaminophen (TYLENOL) 160 MG/5ML solution 650 mg, 650 mg, Per Tube, Q4H PRN **OR** acetaminophen (TYLENOL) suppository 650 mg, 650 mg, Rectal, Q4H PRN, Eugenie Filler, MD   aspirin EC tablet 81 mg, 81 mg, Oral, Daily, Eugenie Filler, MD   atorvastatin (LIPITOR) tablet 20 mg, 20 mg, Oral, Daily, Eugenie Filler, MD   azithromycin (ZITHROMAX) 500 mg in sodium chloride 0.9 % 250 mL IVPB,  500 mg, Intravenous, Q24H, Eugenie Filler, MD   bisacodyl (DULCOLAX) EC tablet 5 mg, 5 mg, Oral, Daily PRN, Eugenie Filler, MD   cefTRIAXone (ROCEPHIN) 2 g in sodium chloride 0.9 % 100 mL IVPB, 2 g, Intravenous, Q24H, Eugenie Filler, MD   clopidogrel (PLAVIX) tablet 75 mg, 75 mg, Oral, Daily, Eugenie Filler, MD   enoxaparin (LOVENOX) injection 40 mg, 40 mg, Subcutaneous, Q24H, Eugenie Filler, MD   folic acid (FOLVITE) tablet 1 mg, 1 mg, Oral, Daily, Eugenie Filler, MD   [START ON 02/27/2022] influenza vaccine adjuvanted (FLUAD) injection 0.5 mL, 0.5 mL, Intramuscular, Tomorrow-1000, Messick, Wallis Bamberg, MD   loperamide (IMODIUM) capsule 2 mg, 2 mg, Oral, PRN, Eugenie Filler, MD   multivitamin tablet 1 tablet, 1 tablet, Oral, Daily, Eugenie Filler, MD   ondansetron [facility]) injection 4 mg, 4 mg, Intravenous, Q6H PRN, Eugenie Filler, MD   pantoprazole (PROTONIX) EC tablet 40 mg, 40 mg, Oral, Daily, Eugenie Filler, MD   prochlorperazine (COMPAZINE) tablet 10 mg, 10 mg, Oral, Q6H PRN, Eugenie Filler, MD   senna-docusate (Senokot-S) tablet 1 tablet, 1 tablet, Oral, QHS PRN, Eugenie Filler, MD  Current Outpatient Medications:    acetaminophen (TYLENOL) 325 MG tablet, Take 2 tablets (650 mg total) by mouth every 6 (six) hours as needed for mild pain (or Fever >/= 101)., Disp: 20 tablet, Rfl: 0   amLODipine (NORVASC) 2.5 MG tablet, TAKE 1 TABLET BY MOUTH EVERY DAY, Disp: 90 tablet, Rfl: 1   aspirin 81 MG tablet, Take 81 mg by mouth daily., Disp: , Rfl:    atorvastatin (LIPITOR) 20 MG tablet, TAKE 1 TABLET BY MOUTH EVERYDAY AT BEDTIME, Disp: 90 tablet, Rfl: 3   bisacodyl (DULCOLAX) 5 MG EC tablet, Take 5 mg by mouth daily as needed for moderate constipation., Disp: , Rfl:    dexamethasone (DECADRON) 4 MG tablet, Please take 1 tablet twice a day the day before, the day of, and the day after chemotherapy, Disp: 40 tablet, Rfl: 1   folic acid (FOLVITE) 1 MG tablet, Take 1 tablet (1 mg total) by mouth daily., Disp: 30 tablet, Rfl: 2   ketoconazole (NIZORAL) 2 % shampoo, Apply topically., Disp: , Rfl:    loperamide (IMODIUM) 2 MG capsule, Take by mouth as needed for diarrhea or loose stools., Disp: , Rfl:    metoprolol succinate (TOPROL-XL) 50 MG 24 hr tablet, Take 0.5 tablets (25 mg total) by mouth daily. TAKE WITH OR IMMEDIATELY FOLLOWING A MEAL., Disp: 90  tablet, Rfl: 3   Multiple Vitamin (MULTIVITAMIN) tablet, Take 1 tablet by mouth daily., Disp: , Rfl:    osimertinib mesylate (TAGRISSO) 40 MG tablet, Take 1 tablet (40 mg total) by mouth daily., Disp: 30 tablet, Rfl: 2   Polyethyl Glycol-Propyl Glycol (SYSTANE HYDRATION PF OP), Apply to eye daily., Disp: , Rfl:    prochlorperazine (COMPAZINE) 10 MG tablet, Take 1 tablet (10 mg total) by mouth every 6 (six) hours as needed., Disp: 30 tablet, Rfl: 2   spironolactone (ALDACTONE) 25 MG tablet, Take 1 tablet (25 mg total) by mouth daily., Disp: 90 tablet, Rfl: 3   White Petrolatum-Mineral Oil ([place] PETROL-MINERAL OIL-LANOLIN) 0.1-0.1 % OINT, Apply to eye as needed., Disp: , Rfl:    Exam: Current vital signs: BP (!) 144/77   Pulse 85   Temp 98 F (36.7 C) (Oral)   Resp 20   SpO2 100%  Vital signs in last 24 hours: Temp:  [  97.8 F (36.6 C)-98 F (36.7 C)] 98 F (36.7 C) (10/17 1504) Pulse Rate:  [85-98] 85 (10/17 1600) Resp:  [18-22] 20 (10/17 1600) BP: (139-149)/(69-89) 144/77 (10/17 1600) SpO2:  [98 %-100 %] 100 % (10/17 1600)  GENERAL: Awake, alert in NAD HEENT: - Normocephalic and atraumatic, dry mm LUNGS - Clear to auscultation bilaterally with no wheezes CV - S1S2 RRR, no m/r/g, equal pulses bilaterally. ABDOMEN - Soft, nontender, nondistended with normoactive BS Ext: warm, well perfused, intact peripheral pulses, mild edema bilateral legs  NEURO:  Mental Status: AA&Ox34 Language: speech is  clear.  Naming, repetition, fluency, and comprehension intact. Cranial Nerves: PERRL EOMI, visual fields full, no facial asymmetry, facial sensation intact, hearing intact, tongue/uvula/soft palate midline, normal sternocleidomastoid and trapezius muscle strength. No evidence of tongue atrophy or fibrillations Motor: 5/5 in all 4 extremities Tone: is normal and bulk is normal Sensation- Intact to light touch bilaterally Coordination: FTN intact bilaterally, no ataxia in BLE. Gait-  deferred  NIHSS 1a Level of Conscious.: 0 1b LOC Questions: 0 1c LOC Commands: 0 2 Best Gaze: 0 3 Visual: 0 4 Facial Palsy: 0 5a Motor Arm - left: 0 5b Motor Arm - Right: 0 6a Motor Leg - Left: 0 6b Motor Leg - Right: 0 7 Limb Ataxia: 0 8 Sensory: 0 9 Best Language: 0 10 Dysarthria: 0 11 Extinct. and Inatten.: 0 TOTAL: 0   Labs I have reviewed labs in epic and the results pertinent to this consultation are:  CBC    Component Value Date/Time   WBC 8.1 02/26/2022 1351   RBC 4.12 02/26/2022 1351   HGB 12.7 02/26/2022 1351   HGB 13.7 02/20/2022 1304   HCT 37.7 02/26/2022 1351   PLT 149 (L) 02/26/2022 1351   PLT 256 02/20/2022 1304   MCV 91.5 02/26/2022 1351   MCH 30.8 02/26/2022 1351   MCHC 33.7 02/26/2022 1351   RDW 14.8 02/26/2022 1351   LYMPHSABS 0.4 (L) 02/26/2022 1351   MONOABS 0.1 02/26/2022 1351   EOSABS 0.2 02/26/2022 1351   BASOSABS 0.0 02/26/2022 1351    CMP     Component Value Date/Time   NA 130 (L) 02/26/2022 1351   NA 137 05/25/2020 0000   K 4.8 02/26/2022 1722   CL 103 02/26/2022 1351   CO2 22 02/26/2022 1351   GLUCOSE 116 (H) 02/26/2022 1351   BUN 41 (H) 02/26/2022 1351   BUN 12 05/25/2020 0000   CREATININE 1.05 (H) 02/26/2022 1351   CREATININE 1.38 (H) 02/20/2022 1304   CREATININE 1.22 (H) 05/21/2021 0800   CALCIUM 9.8 02/26/2022 1351   PROT 5.8 (L) 02/26/2022 1351   ALBUMIN 3.3 (L) 02/26/2022 1351   AST 25 02/26/2022 1351   AST 22 02/20/2022 1304   ALT 18 02/26/2022 1351   ALT 14 02/20/2022 1304   ALKPHOS 55 02/26/2022 1351   BILITOT 0.8 02/26/2022 1351   BILITOT 0.5 02/20/2022 1304   GFRNONAA 51 (L) 02/26/2022 1351   GFRNONAA 37 (L) 02/20/2022 1304   GFRAA 71 04/14/2020 0829    Lipid Panel     Component Value Date/Time   CHOL 151 05/21/2021 0800   CHOL 139 04/14/2020 0829   CHOL 160 01/10/2014 0810   TRIG 83 05/21/2021 0800   TRIG 112 01/10/2014 0810   HDL 64 05/21/2021 0800   HDL 53 04/14/2020 0829   HDL 53 01/10/2014  0810   CHOLHDL 2.4 05/21/2021 0800   VLDL 16 02/26/2016 0849   LDLCALC 70 05/21/2021  0800   LDLCALC 85 01/10/2014 0810     Imaging I have reviewed the images obtained:  MRI examination of the brain 1. 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 Slight edema but no mass effect. 2. Similar versus slightly decreased conspicuity of a tiny treated lesion the posterior right temporal lobe. No progressive disease. 3. Unchanged extra-axial 1.4 cm lesion along the right chronic temporal convexity, probably a meningioma.  Assessment:  Shelley Flores is a 88 y.o. female with past medical history of stage IV lung cancer, recent chemotherapy 7 days prior to admission previously was on [place] per patient, hypertension, hyperlipidemia presenting to the ED with a 6-day history of generalized weakness, gait abnormalities with increased wobbliness having to hold onto things as she moved around and felt like she was going to lose her balance.   Small acute left cerebellum ischemic infarct   Recommendations: - transfer to [facility] and admit to medicine for stroke workup - HgbA1c, fasting lipid panel - Frequent neuro checks - Echocardiogram - CTA head and neck- results pending  - Prophylactic therapy-Antiplatelet med: Aspirin - dose 81mg  and plavix 75mg  daily - Risk factor modification - Telemetry monitoring - PT consult, OT consult, Speech consult - Stroke team to follow  Beulah Gandy DNP, ACNPC-Ag   I have seen the patient and reviewed the above note.  She has a small embolic appearing cerebellar infarct, my suspicion is that this is cardioembolic given the lack of atherosclerotic disease.  She is on chemotherapy that can cause an increased risk of ischemia (bevacizumab, carboplatinum) though I am not sure based on risk less benefit if there is a need to modify therapy.  May need to consider involvement of oncology team.  Given that these are rare side effects, I think that work-up of other  etiologies would be important.  She will need echocardiogram, and telemetry.  If no clear source is found then I would favor prolonged cardiac monitoring.  Also given her history of cancer, I think that performing lower extremity Dopplers would also be prudent, given that she does complain of some stiffness of her legs bilaterally.  Neurology will follow.  Roland Rack, MD [facility] [phone]  If 7pm- 7am, please page neurology on call as listed in [place]. Really nice

## 2022-02-26 NOTE — ED Provider Notes (Signed)
[facility] DEPT Provider Note   CSN: [id number] Arrival date & time: 02/26/22  1303     History  Chief Complaint  Patient presents with   Weakness    Shelley Flores is a 88 y.o. female.  88 year old female with prior medical history as detailed below presents for evaluation.  Patient reports that she resides at [facility].  Patient reports recent initiation of IV chemotherapy.  Patient reports increased weakness.  She reports difficulty ambulating.  She feels like the room is spinning around her.  She complains of significant weakness with prolonged standing or ambulation.  Patient reports that with use of walker she is able to ambulate although she has great difficulty given persistent vertiginous symptoms.  Weakness and vertigo have been present for the last 6 days since recent IV chemotherapy session.  Denies chest pain or fever.  She is followed by Dr. Earlie Server for treatment of stage IV lung cancer.  The history is provided by the patient and medical records.       Home Medications Prior to Admission medications   Medication Sig Start Date End Date Taking? Authorizing Provider  acetaminophen (TYLENOL) 325 MG tablet Take 2 tablets (650 mg total) by mouth every 6 (six) hours as needed for mild pain (or Fever >/= 101). 05/11/20   Sheikh, Omair Latif, DO  amLODipine (NORVASC) 2.5 MG tablet TAKE 1 TABLET BY MOUTH EVERY DAY 09/25/21   [name], MD  aspirin 81 MG tablet Take 81 mg by mouth daily.    [provider]  atorvastatin (LIPITOR) 20 MG tablet TAKE 1 TABLET BY MOUTH EVERYDAY AT BEDTIME 11/08/21   Hilty, Nadean Corwin, MD  bisacodyl (DULCOLAX) 5 MG EC tablet Take 5 mg by mouth daily as needed for moderate constipation.    [provider]  dexamethasone (DECADRON) 4 MG tablet Please take 1 tablet twice a day the day before, the day of, and the day after chemotherapy 02/13/22   Heilingoetter, Cassandra L, PA-C   folic acid (FOLVITE) 1 MG tablet Take 1 tablet (1 mg total) by mouth daily. 02/13/22   Heilingoetter, Cassandra L, PA-C  ketoconazole (NIZORAL) 2 % shampoo Apply topically. 07/12/21   [provider]  loperamide (IMODIUM) 2 MG capsule Take by mouth as needed for diarrhea or loose stools.    [provider]  metoprolol succinate (TOPROL-XL) 50 MG 24 hr tablet Take 0.5 tablets (25 mg total) by mouth daily. TAKE WITH OR IMMEDIATELY FOLLOWING A MEAL. 02/25/22   [place], Amy E, NP  Multiple Vitamin (MULTIVITAMIN) tablet Take 1 tablet by mouth daily.    [provider]  osimertinib mesylate (TAGRISSO) 40 MG tablet Take 1 tablet (40 mg total) by mouth daily. 02/08/22   Heilingoetter, Cassandra L, PA-C  Polyethyl Glycol-Propyl Glycol (SYSTANE HYDRATION PF OP) Apply to eye daily.    [provider]  prochlorperazine (COMPAZINE) 10 MG tablet Take 1 tablet (10 mg total) by mouth every 6 (six) hours as needed. 02/13/22   Heilingoetter, Cassandra L, PA-C  spironolactone (ALDACTONE) 25 MG tablet Take 1 tablet (25 mg total) by mouth daily. 07/05/21 11/21/21  Hilty, Nadean Corwin, MD  White Petrolatum-Mineral Oil [facility] PETROL-MINERAL OIL-LANOLIN) 0.1-0.1 % OINT Apply to eye as needed.    [provider]      Allergies    Irbesartan    Review of Systems   Review of Systems  All other systems reviewed and are negative.   Physical Exam Updated  Vital Signs BP (!) 143/74 (BP Location: Right Arm)   Pulse 98   Temp 97.8 F (36.6 C) (Oral)   Resp (!) 22   SpO2 100%  Physical Exam Vitals and nursing note reviewed.  Constitutional:      General: She is not in acute distress.    Appearance: Normal appearance. She is well-developed.  HENT:     Head: Normocephalic and atraumatic.     Mouth/Throat:     Mouth: Mucous membranes are dry.  Eyes:     Conjunctiva/sclera: Conjunctivae normal.     Pupils: Pupils are equal, round, and reactive to light.  Cardiovascular:     Rate and  Rhythm: Regular rhythm. Tachycardia present.     Heart sounds: Normal heart sounds.  Pulmonary:     Effort: Pulmonary effort is normal. No respiratory distress.     Breath sounds: Normal breath sounds.  Abdominal:     General: There is no distension.     Palpations: Abdomen is soft.     Tenderness: There is no abdominal tenderness.  Musculoskeletal:        General: No deformity. Normal range of motion.     Cervical back: Normal range of motion and neck supple.  Skin:    General: Skin is warm and dry.  Neurological:     General: No focal deficit present.     Mental Status: She is alert and oriented to person, place, and time.     Cranial Nerves: No cranial nerve deficit.     Sensory: No sensory deficit.     Comments: Alert, oriented x3, no facial droop, normal speech, 5 out of 5 strength in both upper and lower extremities     ED Results / Procedures / Treatments   Labs (all labs ordered are listed, but only abnormal results are displayed) Labs Reviewed  CBC WITH DIFFERENTIAL/PLATELET - Abnormal; Notable for the following components:      Result Value   Platelets 149 (*)    Lymphs Abs 0.4 (*)    All other components within normal limits  RESP PANEL BY RT-PCR (FLU A&B, COVID) ARPGX2  COMPREHENSIVE METABOLIC PANEL  URINALYSIS, ROUTINE W REFLEX MICROSCOPIC  TROPONIN I (HIGH SENSITIVITY)    EKG EKG Interpretation  Date/Time:  Tuesday February 26 2022 13:56:36 EDT Ventricular Rate:  79 PR Interval:  146 QRS Duration: 79 QT Interval:  359 QTC Calculation: 412 R Axis:   -9 Text Interpretation: Sinus rhythm Inferior infarct, old Probable anterior infarct, age indeterminate Lateral leads are also involved Confirmed by Dene Gentry [phone]) on 02/26/2022 2:24:07 PM  Radiology DG Chest Port 1 View  Result Date: 02/26/2022 CLINICAL DATA:  Generalized weakness EXAM: PORTABLE CHEST 1 VIEW COMPARISON:  06/30/2020 FINDINGS: Cardiac size is within normal limits. Low position of  diaphragms suggests COPD. Small to moderate left pleural effusion is seen. There is interval removal of left chest tube. Increased interstitial markings are seen in left lower lung field. There is no focal consolidation. There is no significant right pleural effusion. There is no pneumothorax. IMPRESSION: Small to moderate left pleural effusion. Increased markings in left lower lung fields suggest possible interstitial pneumonia. Electronically Signed   By: Elmer Picker M.D.   On: 02/26/2022 14:14    Procedures Procedures    Medications Ordered in ED Medications  sodium chloride 0.9 % bolus 500 mL (500 mLs Intravenous New Bag/Given 02/26/22 1355)    ED Course/ Medical Decision Making/ A&P  Medical Decision Making Amount and/or Complexity of Data Reviewed Labs: ordered. Radiology: ordered.  Risk Prescription drug management. Decision regarding hospitalization.    Medical Screen Complete  This patient presented to the ED with complaint of weakness, vertigo.  This complaint involves an extensive number of treatment options. The initial differential diagnosis includes, but is not limited to, dehydration, metabolic abnormality, intracranial pathology given history of CNS mets, etc.  This presentation is: Acute, Chronic, Self-Limited, Previously Undiagnosed, Uncertain Prognosis, Complicated, Systemic Symptoms, and Threat to Life/Bodily Function  Patient is presenting with complaint of persistent vertiginous symptoms and associated weakness.  Patient associates her symptoms onset with recent IV chemotherapy session.  Patient appears to be grossly intact neurologically.  Patient is due for MRI imaging tomorrow with Dr. Earlie Server.  Exam is suggestive of mild dehydration.  MRI obtained demonstrates evidence of small acute infarct in left cerebellum.  Dr. Leonel Ramsay with neuro is aware of case and will consult.  He request that patient be placed in  line for bed at [facility].  Hospitalist service made aware of case and will evaluate for admission.   Additional history obtained: External records from outside sources obtained and reviewed including prior ED visits and prior Inpatient records.    Lab Tests:  I ordered and personally interpreted labs.  The pertinent results include: CBC, CMP, troponin, COVID, flu, UA   Imaging Studies ordered:  I ordered imaging studies including x-ray, MRI brain I independently visualized and interpreted obtained imaging which showed small acute infarct I agree with the radiologist interpretation.   Cardiac Monitoring:  The patient was maintained on a cardiac monitor.  I personally viewed and interpreted the cardiac monitor which showed an underlying rhythm of: NSR   Medicines ordered:  I ordered medication including IV fluids for suspected dehydration Reevaluation of the patient after these medicines showed that the patient: improved   Problem List / ED Course:  CVA, weakness   Reevaluation:  After the interventions noted above, I reevaluated the patient and found that they have: improved   Disposition:  After consideration of the diagnostic results and the patients response to treatment, I feel that the patent would benefit from admission.   CRITICAL CARE Performed by: Valarie Merino   Total critical care time: 30 minutes  Critical care time was exclusive of separately billable procedures and treating other patients.  Critical care was necessary to treat or prevent imminent or life-threatening deterioration.  Critical care was time spent personally by me on the following activities: development of treatment plan with patient and/or surrogate as well as nursing, discussions with consultants, evaluation of patient's response to treatment, examination of patient, obtaining history from patient or surrogate, ordering and performing treatments and interventions, ordering and  review of laboratory studies, ordering and review of radiographic studies, pulse oximetry and re-evaluation of patient's condition.          Final Clinical Impression(s) / ED Diagnoses Final diagnoses:  Weakness  Cerebrovascular accident (CVA), unspecified mechanism ([place])    Rx / DC Orders ED Discharge Orders     None         Valarie Merino, MD 02/26/22 1540

## 2022-02-27 ENCOUNTER — Inpatient Hospital Stay (HOSPITAL_COMMUNITY): Payer: Medicare PPO

## 2022-02-27 ENCOUNTER — Ambulatory Visit: Payer: Medicare PPO

## 2022-02-27 ENCOUNTER — Ambulatory Visit: Payer: Medicare PPO | Admitting: Internal Medicine

## 2022-02-27 ENCOUNTER — Other Ambulatory Visit: Payer: Medicare PPO

## 2022-02-27 ENCOUNTER — Ambulatory Visit (HOSPITAL_COMMUNITY): Payer: Medicare PPO

## 2022-02-27 DIAGNOSIS — I6389 Other cerebral infarction: Secondary | ICD-10-CM

## 2022-02-27 DIAGNOSIS — I639 Cerebral infarction, unspecified: Secondary | ICD-10-CM | POA: Diagnosis present

## 2022-02-27 DIAGNOSIS — R29898 Other symptoms and signs involving the musculoskeletal system: Secondary | ICD-10-CM

## 2022-02-27 DIAGNOSIS — Z8673 Personal history of transient ischemic attack (TIA), and cerebral infarction without residual deficits: Secondary | ICD-10-CM | POA: Diagnosis present

## 2022-02-27 LAB — COMPREHENSIVE METABOLIC PANEL
ALT: 18 U/L (ref 0–44)
AST: 25 U/L (ref 15–41)
Albumin: 3 g/dL — ABNORMAL LOW (ref 3.5–5.0)
Alkaline Phosphatase: 56 U/L (ref 38–126)
Anion gap: 5 (ref 5–15)
BUN: 25 mg/dL — ABNORMAL HIGH (ref 8–23)
CO2: 20 mmol/L — ABNORMAL LOW (ref 22–32)
Calcium: 9.3 mg/dL (ref 8.9–10.3)
Chloride: 108 mmol/L (ref 98–111)
Creatinine, Ser: 0.81 mg/dL (ref 0.44–1.00)
GFR, Estimated: >60 mL/min (ref >60)
Glucose, Bld: 94 mg/dL (ref 70–99)
Potassium: 4.8 mmol/L (ref 3.5–5.1)
Sodium: 133 mmol/L — ABNORMAL LOW (ref 135–145)
Total Bilirubin: 0.7 mg/dL (ref 0.3–1.2)
Total Protein: 5.5 g/dL — ABNORMAL LOW (ref 6.5–8.1)

## 2022-02-27 LAB — LIPID PANEL
Cholesterol: 143 mg/dL (ref 0–200)
HDL: 59 mg/dL (ref >40)
LDL Cholesterol: 67 mg/dL (ref 0–99)
Total CHOL/HDL Ratio: 2.4 RATIO
Triglycerides: 83 mg/dL (ref <150)
VLDL: 17 mg/dL (ref 0–40)

## 2022-02-27 LAB — CBC WITH DIFFERENTIAL/PLATELET
Abs Immature Granulocytes: 0.02 10*3/uL (ref 0.00–0.07)
Basophils Absolute: 0 10*3/uL (ref 0.0–0.1)
Basophils Relative: 0 %
Eosinophils Absolute: 0.3 10*3/uL (ref 0.0–0.5)
Eosinophils Relative: 4 %
HCT: 38.1 % (ref 36.0–46.0)
Hemoglobin: 12.7 g/dL (ref 12.0–15.0)
Immature Granulocytes: 0 %
Lymphocytes Relative: 14 %
Lymphs Abs: 0.9 10*3/uL (ref 0.7–4.0)
MCH: 30.4 pg (ref 26.0–34.0)
MCHC: 33.3 g/dL (ref 30.0–36.0)
MCV: 91.1 fL (ref 80.0–100.0)
Monocytes Absolute: 0.2 10*3/uL (ref 0.1–1.0)
Monocytes Relative: 4 %
Neutro Abs: 5.1 10*3/uL (ref 1.7–7.7)
Neutrophils Relative %: 78 %
Platelets: 132 10*3/uL — ABNORMAL LOW (ref 150–400)
RBC: 4.18 MIL/uL (ref 3.87–5.11)
RDW: 14.6 % (ref 11.5–15.5)
WBC: 6.5 10*3/uL (ref 4.0–10.5)
nRBC: 0 % (ref 0.0–0.2)

## 2022-02-27 LAB — LEGIONELLA PNEUMOPHILA SEROGP 1 UR AG: L. pneumophila Serogp 1 Ur Ag: NEGATIVE

## 2022-02-27 LAB — PROCALCITONIN: Procalcitonin: <0.1 ng/mL

## 2022-02-27 LAB — ECHOCARDIOGRAM COMPLETE
Area-P 1/2: 1.72 cm2
Calc EF: 77.1 %
S' Lateral: 1.8 cm
Single Plane A2C EF: 76.3 %
Single Plane A4C EF: 76.6 %

## 2022-02-27 LAB — MAGNESIUM: Magnesium: 2 mg/dL (ref 1.7–2.4)

## 2022-02-27 LAB — PHOSPHORUS: Phosphorus: 2.2 mg/dL — ABNORMAL LOW (ref 2.5–4.6)

## 2022-02-27 MED ORDER — K PHOS MONO-SOD PHOS DI & MONO 155-852-130 MG PO TABS
500.0000 mg | ORAL_TABLET | Freq: Three times a day (TID) | ORAL | Status: DC
  Administered 2022-02-27 – 2022-03-01 (×8): 500 mg via ORAL
  Filled 2022-02-27 (×12): qty 2

## 2022-02-27 MED ORDER — AZITHROMYCIN 250 MG PO TABS
500.0000 mg | ORAL_TABLET | Freq: Every day | ORAL | Status: DC
  Administered 2022-02-27 – 2022-03-01 (×3): 500 mg via ORAL
  Filled 2022-02-27 (×3): qty 2

## 2022-02-27 NOTE — Progress Notes (Signed)
PHARMACIST - PHYSICIAN COMMUNICATION  CONCERNING: Antibiotic IV to Oral Route Change Policy  RECOMMENDATION: This patient is receiving azithromycin by the intravenous route.  Based on criteria approved by the Pharmacy and Therapeutics Committee, the antibiotic(s) is/are being converted to the equivalent oral dose form(s).   DESCRIPTION: These criteria include:  Patient being treated for a respiratory tract infection, urinary tract infection, cellulitis or clostridium difficile associated diarrhea if on metronidazole  The patient is not neutropenic and does not exhibit a GI malabsorption state  The patient is eating (either orally or via tube) and/or has been taking other orally administered medications for a least 24 hours  The patient is improving clinically and has a Tmax < 100.5  If you have questions about this conversion, please contact the Pharmacy Department  []  ( [phone] )  [facility] []  ( [phone] )   [facility] []  ( [phone]  [place] []  ( [phone]  [facility] [x]  ( [phone] )  [facility]  

## 2022-02-27 NOTE — ED Notes (Signed)
Attempted to take pt upstairs several time but pt is not ready d/t other care team providing care. Pt now s/p Physical Therapy sitting on the chair eating lunch.

## 2022-02-27 NOTE — Progress Notes (Signed)
  Echocardiogram 2D Echocardiogram has been performed.  Shelley Flores 02/27/2022, 1:54 PM

## 2022-02-27 NOTE — Evaluation (Signed)
Physical Therapy Evaluation Patient Details Name: Shelley Flores MRN: [id number] DOB: [birth year] Today's Date: 02/27/2022  History of Present Illness  Patient is a 88 year old female who presented to the hosptial with 6 day history of gait abnormalities, and weakness. 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 BLE doppler was negative PMH: chemotherapy, stage IV lung cancer.  Clinical Impression  The patient was admitted for above medical problems.  Patient reporting that [facility] and plans to return, can hire  caregivers as needed.  Patient states that she feels more unbalanced today. Patient relying on use of Rw for stability.  Obtained a  recliner so patient could get up and have her lunch. Patient  should progress to return home with support.  Pt admitted with above diagnosis.  Pt currently with functional limitations due to the deficits listed below (see PT Problem List). Pt will benefit from skilled PT to increase their independence and safety with mobility to allow discharge to the venue listed below.          Recommendations for follow up therapy are one component of a multi-disciplinary discharge planning process, led by the attending physician.  Recommendations may be updated based on patient status, additional functional criteria and insurance authorization.  Follow Up Recommendations Home health PT Can patient physically be transported by private vehicle: No    Assistance Recommended at Discharge Frequent or constant Supervision/Assistance  Patient can return home with the following  A little help with walking and/or transfers;Assistance with cooking/housework;Assist for transportation;A little help with bathing/dressing/bathroom;Help with stairs or ramp for entrance    Equipment Recommendations None recommended by PT  Recommendations for Other Services       Functional Status Assessment Patient has had a recent decline in  their functional status and demonstrates the ability to make significant improvements in function in a reasonable and predictable amount of time.     Precautions / Restrictions Precautions Precautions: Fall Restrictions Weight Bearing Restrictions: No      Mobility  Bed Mobility                    Transfers Overall transfer level: Needs assistance Equipment used: Rolling walker (2 wheels) Transfers: Sit to/from Stand Sit to Stand: Min guard           General transfer comment: from stretcher at [employer]    Ambulation/Gait Ambulation/Gait assistance: Min assist Gait Distance (Feet): 10 Feet Assistive device: Rolling walker (2 wheels) Gait Pattern/deviations: Step-to pattern, Step-through pattern       General Gait Details: managed around the stretcher to the recliner,  Stairs            Wheelchair Mobility    Modified Rankin (Stroke Patients Only)       Balance Overall balance assessment: History of Falls, Needs assistance Sitting-balance support: Feet supported, No upper extremity supported Sitting balance-Leahy Scale: Fair     Standing balance support: During functional activity, Bilateral upper extremity supported, Reliant on assistive device for balance Standing balance-Leahy Scale: Poor                               Pertinent Vitals/Pain Pain Assessment Pain Assessment: No/denies pain    Home Living Family/patient expects to be discharged to:: Private residence Living Arrangements: Alone   Type of Home: Independent living facility Home Access: Level entry       Home Layout:  One level Home Equipment: Rollator (4 wheels)      Prior Function Prior Level of Function : Independent/Modified Independent             Mobility Comments: uses Rollator ADLs Comments: IADL's     Hand Dominance   Dominant Hand: Right    Extremity/Trunk Assessment   Upper Extremity Assessment Upper Extremity Assessment: Defer to OT  evaluation RUE Deficits / Details: noted to have slight weakness when compared to L side. 3+/5 LUE Deficits / Details: shoulder and grip strenght 3+/5    Lower Extremity Assessment Lower Extremity Assessment: Generalized weakness    Cervical / Trunk Assessment Cervical / Trunk Assessment: Normal  Communication   Communication: No difficulties  Cognition Arousal/Alertness: Awake/alert Behavior During Therapy: WFL for tasks assessed/performed Overall Cognitive Status: Within Functional Limits for tasks assessed                                          General Comments      Exercises     Assessment/Plan    PT Assessment Patient needs continued PT services  PT Problem List Decreased strength;Decreased mobility;Decreased safety awareness;Decreased knowledge of precautions;Decreased activity tolerance;Decreased balance       PT Treatment Interventions DME instruction;Therapeutic activities;Gait training;Therapeutic exercise;Patient/family education;Functional mobility training    PT Goals (Current goals can be found in the Care Plan section)  Acute Rehab PT Goals Patient Stated Goal: go back to her apt. PT Goal Formulation: With patient Time For Goal Achievement: 03/13/22 Potential to Achieve Goals: Fair    Frequency Min 3X/week     Co-evaluation PT/OT/SLP Co-Evaluation/Treatment: Yes Reason for Co-Treatment: For patient/therapist safety PT goals addressed during session: Mobility/safety with mobility OT goals addressed during session: ADL's and self-care       AM-PAC PT "6 Clicks" Mobility  Outcome Measure Help needed turning from your back to your side while in a flat bed without using bedrails?: A Little Help needed moving from lying on your back to sitting on the side of a flat bed without using bedrails?: A Little Help needed moving to and from a bed to a chair (including a wheelchair)?: A Little Help needed standing up from a chair using your  arms (e.g., wheelchair or bedside chair)?: A Little Help needed to walk in hospital room?: A Lot Help needed climbing 3-5 steps with a railing? : Total 6 Click Score: 15    End of Session   Activity Tolerance: Patient tolerated treatment well Patient left: in chair;with call bell/phone within reach Nurse Communication: Mobility status PT Visit Diagnosis: Unsteadiness on feet (R26.81);Other symptoms and signs involving the nervous system (R29.898)    Time: 2993-7169 PT Time Calculation (min) (ACUTE ONLY): 14 min   Charges:   PT Evaluation $PT Eval Low Complexity: 1 Low          [facility] [phone] Weekend [phone]   Claretha Cooper 02/27/2022, 4:09 PM

## 2022-02-27 NOTE — Plan of Care (Signed)

## 2022-02-27 NOTE — Hospital Course (Addendum)
88 year old female with significant medical history of stage IV lung cancer, on IV chemotherapy last dose 7 days ago presented with generalized weakness gait abnormalities x6 days with increased wobbliness. She was seen in the ED underwent routine evaluation labs, hyperkalemia troponin 32 chest x-ray small to moderate pleural effusion but afebrile normal white counts, MRI small acute infarct in the left cerebellum slight edema but no mass effect increased conspicuity of tiny T2 lesion in the posterior right temporal lobe, no progressive disease 1 cm lesion along the right chronic temporal convexity probably meningioma. Patient was admitted neurology consulted-found to have small acute cerebellar ischemic infarct stroke work-up completed LDL at goal HbA1c 5.7, no atherosclerotic disease in in head and neck.  Echo with EF 65-70%, mild concentric LVH, G1 DD normal RV systolic function normal mitral valve aortic valve tricuspid, no regional wall motion abnormalities no atrial level shunt detected. Telemetry stable  she will continue with PT OT-did well, neurology advised cardiac monitor as outpatient and patient will continue on her antiplatelet regimen as prescribed by neurology.  Made her oncology aware that patient had stroke event after chemotherapy to see if chemotherapy needs to be modified.  She was found to have orthostatic hypotension given IV fluid boluses, also placed on low-dose midodrine, compression stocking.  She was asymptomatic on standing up- on OV stable on recheck(lying 114/75 sitting 153/83, standing 140/74 and 150/82 heart rate did not go above 107-she will be discharged to SNF.

## 2022-02-27 NOTE — Progress Notes (Signed)
PROGRESS NOTE Shelley Flores  RJJ:[id number] DOB: August 24, 1932 DOA: 02/26/2022 PCP: [name], MD   Brief Narrative/Hospital Course: 88 year old female with significant medical history of stage IV lung cancer, on IV chemotherapy last dose 7 days ago presented with generalized weakness gait abnormalities x6 days with increased wobbliness. She was seen in the ED underwent routine evaluation labs, hyperkalemia troponin 32 chest x-ray small to moderate pleural effusion but afebrile normal white counts, MRI small acute infarct in the left cerebellum slight edema but no mass effect increased conspicuity of tiny T2 lesion in the posterior right temporal lobe, no progressive disease 1 cm lesion along the right chronic temporal convexity probably meningioma. Patient was admitted neurology consulted-found to have small acute cerebellar ischemic infarct stroke work-up completed LDL at goal HbA1c 5.7, no atherosclerotic disease in in head and neck.  If echo looks unremarkable and telemetry stable patient will need cardiac monitor as outpatient and patient will continue on her antiplatelet regimen as prescribed by neurology.  Made her oncology aware that patient had stroke event after chemotherapy to see if chemotherapy needs to be modified.    Subjective: Seen and examined this morning in the ED, no new complaints   Assessment and Plan: Principal Problem:   Cerebellar stroke, acute ([place]) Active Problems:   Essential hypertension   Hyperlipidemia   PVC's (premature ventricular contractions)   Hyponatremia   GERD (gastroesophageal reflux disease)   Adenocarcinoma of left lung, stage 4 (HCC)   Acute stroke due to ischemia (HCC)   Small acute left cerebellar ischemic infarct: Neurology following, continues to work-up LDL 67:, HbA1c 5.7 indicating prediabetes. CTA head and neck-no atherosclerotic disease, TTE pending.  Continue to monitor and telemetry, continue PT OT speech eval.  Antiplatelet with  aspirin 81 mg and Plavix 75 mg per neurology> discussed with Dr. Leonel Ramsay no need to transfer to [facility].  Suspecting cardioembolic given lack of atherosclerotic disease, chemotherapy could cause increased risk of ischemia-I have made Dr. Julien Nordmann aware-feels it is less likely but we will review at her follow-up.lower extremity Dopplers no DVT.  Dehydration-managed w/ ivf. Hypertension allow permissive hypertension holding meds Hyperkalemia-resolved Hypophosphatemia repleted Mildly elevated troponin likely demand ischemia flat- tte done. Thrombocytopenia monitor as OP prediabetes and A1c 5.6 diet education. Chronic hyponatremia-stable Hyperlipidemia LDL at goal, statin to be continued GERD continue PPI Abnormal chest x-ray with moderate left pleural effusion, suggestive of interstitial pneumonia, but afebrile with normal white counts with immunocompromise status stage IV lung cancer on chemotherapy checked Legionella-pending, pneumococcal antigen negative, continue empiric antibiotics  DVT prophylaxis: enoxaparin (LOVENOX) injection 30 mg Start: 02/26/22 2200 Code Status:   Code Status: DNR Family Communication: plan of care discussed with patient at bedside. Patient status is: Inpatient because of stroke work-up Level of care: Telemetry   Dispo: The patient is from: home            Anticipated disposition: home tomorrow pending stroke work-up completion PT OT evaluation  Mobility Assessment (last 72 hours)     Mobility Assessment     Row Name 02/27/22 1601           What is the highest level of mobility based on the progressive mobility assessment? Level 5 (Walks with assist in room/hall) - Balance while stepping forward/back and can walk in room with assist - Complete                 Objective: Vitals last 24 hrs: Vitals:   02/27/22 0940 02/27/22 1030 02/27/22  1330 02/27/22 1502  BP: (!) 115/58 132/70 136/74 (!) 142/64  Pulse: 88 86 81 85  Resp: 16 16 18 20    Temp:  97.9 F (36.6 C)  97.9 F (36.6 C)  TempSrc:  Oral  Oral  SpO2: 98% 97% 98% 99%   Weight change:   Physical Examination: General exam: alert awake, older than stated age HEENT:Oral mucosa moist, Ear/Nose WNL grossly Respiratory system: bilaterally clear BS, no use of accessory muscle Cardiovascular system: S1 & S2 +, No JVD. Gastrointestinal system: Abdomen soft,NT,ND, BS+ Nervous System:Alert, awake, moving extremities. Extremities: LE edema neg,distal peripheral pulses palpable.  Skin: No rashes,no icterus. MSK: Normal muscle bulk,tone, power  Medications reviewed:  Scheduled Meds:   stroke: early stages of recovery book   Does not apply Once   aspirin EC  81 mg Oral Daily   atorvastatin  20 mg Oral q1800   azithromycin  500 mg Oral QHS   clopidogrel  75 mg Oral Daily   enoxaparin (LOVENOX) injection  30 mg Subcutaneous F81O   folic acid  1 mg Oral Daily   multivitamin with minerals  1 tablet Oral Daily   pantoprazole  40 mg Oral Daily   phosphorus  500 mg Oral TID   Continuous Infusions:  sodium chloride 75 mL/hr at 02/27/22 0110   cefTRIAXone (ROCEPHIN)  IV Stopped (02/26/22 2255)      Diet Order             Diet Heart Room service appropriate? Yes; Fluid consistency: Thin  Diet effective now                           No intake or output data in the 24 hours ending 02/27/22 1603 Net IO Since Admission: 500 mL [02/27/22 1603]  Wt Readings from Last 3 Encounters:  02/25/22 46.7 kg  02/20/22 46.2 kg  02/13/22 46.6 kg     Unresulted Labs (From admission, onward)     Start     Ordered   02/28/22 0500  Procalcitonin  Daily at 5am,   R      02/27/22 1426           Data Reviewed: I have personally reviewed following labs and imaging studies CBC: Recent Labs  Lab 02/26/22 1351 02/27/22 0430  WBC 8.1 6.5  NEUTROABS 7.4 5.1  HGB 12.7 12.7  HCT 37.7 38.1  MCV 91.5 91.1  PLT 149* 175*   Basic Metabolic Panel: Recent Labs  Lab  02/26/22 1351 02/26/22 1722 02/27/22 0430  NA 130*  --  133*  K 5.3* 4.8 4.8  CL 103  --  108  CO2 22  --  20*  GLUCOSE 116*  --  94  BUN 41*  --  25*  CREATININE 1.05*  --  0.81  CALCIUM 9.8  --  9.3  MG  --   --  2.0  PHOS  --   --  2.2*   GFR: Estimated Creatinine Clearance: 36.1 mL/min (by C-G formula based on SCr of 0.81 mg/dL). Liver Function Tests: Recent Labs  Lab 02/26/22 1351 02/27/22 0430  AST 25 25  ALT 18 18  ALKPHOS 55 56  BILITOT 0.8 0.7  PROT 5.8* 5.5*  ALBUMIN 3.3* 3.0*   No results for input(s): "LIPASE", "AMYLASE" in the last 168 hours. No results for input(s): "AMMONIA" in the last 168 hours. Coagulation Profile: No results for input(s): "INR", "PROTIME" in the last 168 hours.  BNP (last 3 results) No results for input(s): "PROBNP" in the last 8760 hours. HbA1C: Recent Labs    02/26/22 1351  HGBA1C 5.7*   CBG: No results for input(s): "GLUCAP" in the last 168 hours. Lipid Profile: Recent Labs    02/27/22 0430  CHOL 143  HDL 59  LDLCALC 67  TRIG 83  CHOLHDL 2.4   Thyroid Function Tests: No results for input(s): "TSH", "T4TOTAL", "FREET4", "T3FREE", "THYROIDAB" in the last 72 hours. Sepsis Labs: Recent Labs  Lab 02/27/22 0430  PROCALCITON <0.10    Recent Results (from the past 240 hour(s))  Resp Panel by RT-PCR (Flu A&B, Covid) Anterior Nasal Swab     Status: None   Collection Time: 02/26/22  2:05 PM   Specimen: Anterior Nasal Swab  Result Value Ref Range Status   SARS Coronavirus 2 by RT PCR NEGATIVE NEGATIVE Final    Comment: (NOTE) SARS-CoV-2 target nucleic acids are NOT DETECTED.  The SARS-CoV-2 RNA is generally detectable in upper respiratory specimens during the acute phase of infection. The lowest concentration of SARS-CoV-2 viral copies this assay can detect is 138 copies/mL. A negative result does not preclude SARS-Cov-2 infection and should not be used as the sole basis for treatment or other patient management  decisions. A negative result may occur with  improper specimen collection/handling, submission of specimen other than nasopharyngeal swab, presence of viral mutation(s) within the areas targeted by this assay, and inadequate number of viral copies(<138 copies/mL). A negative result must be combined with clinical observations, patient history, and epidemiological information. The expected result is Negative.  Fact Sheet for Patients:  EntrepreneurPulse.com.au  Fact Sheet for Healthcare Providers:  IncredibleEmployment.be  This test is no t yet approved or cleared by the [place] FDA and  has been authorized for detection and/or diagnosis of SARS-CoV-2 by FDA under an Emergency Use Authorization (EUA). This EUA will remain  in effect (meaning this test can be used) for the duration of the COVID-19 declaration under Section 564(b)(1) of the Act, 21 U.S.C.section 360bbb-3(b)(1), unless the authorization is terminated  or revoked sooner.       Influenza A by PCR NEGATIVE NEGATIVE Final   Influenza B by PCR NEGATIVE NEGATIVE Final    Comment: (NOTE) The Xpert Xpress SARS-CoV-2/FLU/RSV plus assay is intended as an aid in the diagnosis of influenza from Nasopharyngeal swab specimens and should not be used as a sole basis for treatment. Nasal washings and aspirates are unacceptable for Xpert Xpress SARS-CoV-2/FLU/RSV testing.  Fact Sheet for Patients: EntrepreneurPulse.com.au  Fact Sheet for Healthcare Providers: IncredibleEmployment.be  This test is not yet approved or cleared by the [place] FDA and has been authorized for detection and/or diagnosis of SARS-CoV-2 by FDA under an Emergency Use Authorization (EUA). This EUA will remain in effect (meaning this test can be used) for the duration of the COVID-19 declaration under Section 564(b)(1) of the Act, 21 U.S.C. section 360bbb-3(b)(1), unless the  authorization is terminated or revoked.  Performed at [facility], [place] [address], [place], [place] [address]     Antimicrobials: Anti-infectives (From admission, onward)    Start     Dose/Rate Route Frequency Ordered Stop   02/27/22 2200  azithromycin (ZITHROMAX) tablet 500 mg        500 mg Oral Daily at bedtime 02/27/22 1116 03/03/22 2159   02/26/22 1900  cefTRIAXone (ROCEPHIN) 2 g in sodium chloride 0.9 % 100 mL IVPB        2 g 200  mL/hr over 30 Minutes Intravenous Every 24 hours 02/26/22 1722 03/03/22 1859   02/26/22 1900  azithromycin (ZITHROMAX) 500 mg in sodium chloride 0.9 % 250 mL IVPB  Status:  Discontinued        500 mg 250 mL/hr over 60 Minutes Intravenous Every 24 hours 02/26/22 1722 02/27/22 1116      Culture/Microbiology    Component Value Date/Time   SDES PLEURAL FLUID 05/04/2020 1409   SPECREQUEST LEFT LUNG 05/04/2020 1409   CULT  05/04/2020 1409    NO GROWTH Performed at [facility], [place] [address] [place], [place] [address]    REPTSTATUS 05/07/2020 FINAL 05/04/2020 1409    Other culture-see note  Radiology Studies: VAS [place] LOWER EXTREMITY VENOUS (DVT)  Result Date: 02/27/2022  Lower Venous DVT Study Patient Name:  Shelley Flores  Date of Exam:   02/27/2022 Medical Rec #: [id number]           Accession #:    [id number] Date of Birth: April 26, 1933            Patient Gender: F Patient Age:   35 years Exam Location:  [facility] Procedure:      VAS [place] LOWER EXTREMITY VENOUS (DVT) Referring Phys: MCNEILL KIRKPATRICK --------------------------------------------------------------------------------  Indications: Stroke, and "leg stiffness".  Risk Factors: Chemotherapy. Comparison Study: Previous exam on 05/04/20 was negative for DVT Performing Technologist: Rogelia Rohrer RVT, RDMS  Examination Guidelines: A complete evaluation includes B-mode imaging, spectral Doppler, color Doppler, and power Doppler as needed of all accessible portions  of each vessel. Bilateral testing is considered an integral part of a complete examination. Limited examinations for reoccurring indications may be performed as noted. The reflux portion of the exam is performed with the patient in reverse Trendelenburg.  +---------+---------------+---------+-----------+----------+--------------+ RIGHT    CompressibilityPhasicitySpontaneityPropertiesThrombus Aging +---------+---------------+---------+-----------+----------+--------------+ CFV      Full           Yes      Yes                                 +---------+---------------+---------+-----------+----------+--------------+ SFJ      Full                                                        +---------+---------------+---------+-----------+----------+--------------+ FV Prox  Full           Yes      Yes                                 +---------+---------------+---------+-----------+----------+--------------+ FV Mid   Full           Yes      Yes                                 +---------+---------------+---------+-----------+----------+--------------+ FV DistalFull           Yes      Yes                                 +---------+---------------+---------+-----------+----------+--------------+ PFV  Full                                                        +---------+---------------+---------+-----------+----------+--------------+ POP      Full           Yes      Yes                                 +---------+---------------+---------+-----------+----------+--------------+ PTV      Full                                                        +---------+---------------+---------+-----------+----------+--------------+ PERO     Full                                                        +---------+---------------+---------+-----------+----------+--------------+   +---------+---------------+---------+-----------+----------+--------------+ LEFT      CompressibilityPhasicitySpontaneityPropertiesThrombus Aging +---------+---------------+---------+-----------+----------+--------------+ CFV      Full           Yes      Yes                                 +---------+---------------+---------+-----------+----------+--------------+ SFJ      Full                                                        +---------+---------------+---------+-----------+----------+--------------+ FV Prox  Full           Yes      Yes                                 +---------+---------------+---------+-----------+----------+--------------+ FV Mid   Full           Yes      Yes                                 +---------+---------------+---------+-----------+----------+--------------+ FV DistalFull           Yes      Yes                                 +---------+---------------+---------+-----------+----------+--------------+ PFV      Full                                                        +---------+---------------+---------+-----------+----------+--------------+ POP  Full           Yes      Yes                                 +---------+---------------+---------+-----------+----------+--------------+ PTV      Full                                                        +---------+---------------+---------+-----------+----------+--------------+ PERO     Full                                                        +---------+---------------+---------+-----------+----------+--------------+    Summary: BILATERAL: - No evidence of deep vein thrombosis seen in the lower extremities, bilaterally. -No evidence of popliteal cyst, bilaterally.   *See table(s) above for measurements and observations.    Preliminary    CT ANGIO HEAD NECK W WO CM  Result Date: 02/26/2022 CLINICAL DATA:  Weakness after receiving chemotherapy; small acute infarct in the left cerebellum on same-day MRI EXAM: CT ANGIOGRAPHY HEAD AND NECK  TECHNIQUE: Multidetector CT imaging of the head and neck was performed using the standard protocol during bolus administration of intravenous contrast. Multiplanar CT image reconstructions and MIPs were obtained to evaluate the vascular anatomy. Carotid stenosis measurements (when applicable) are obtained utilizing NASCET criteria, using the distal internal carotid diameter as the denominator. RADIATION DOSE REDUCTION: This exam was performed according to the departmental dose-optimization program which includes automated exposure control, adjustment of the mA and/or kV according to patient size and/or use of iterative reconstruction technique. CONTRAST:  56mL OMNIPAQUE IOHEXOL 350 MG/ML SOLN COMPARISON:  No prior CT or CTA, correlation is made with 02/26/2022 MRI head and 01/28/2022 CT chest FINDINGS: CT HEAD FINDINGS Brain: Linear hypodensity in the left cerebellum (series 14, image 7) correlates with the acute infarcts seen on the same-day MRI. No additional acute infarct. The known extra-axial mass along the right frontoparietal convexity is better seen on the same-day MRI (series 14, image 25). The known right posterior temporal lobe lesion is not appreciated on CT. No acute hemorrhage, mass effect, or midline shift. No hydrocephalus or extra-axial fluid collection. Vascular: No hyperdense vessel. Skull: Normal. Negative for fracture or focal lesion. Sinuses/Orbits: No acute finding. Status post bilateral lens replacements. Other: The mastoid air cells are well aerated. CTA NECK FINDINGS Aortic arch: Standard branching. Imaged portion shows no evidence of aneurysm or dissection. No significant stenosis of the major arch vessel origins. Right carotid system: No evidence of dissection, occlusion, or hemodynamically significant stenosis (greater than 50%). Left carotid system: No evidence of dissection, occlusion, or hemodynamically significant stenosis (greater than 50%). Vertebral arteries: No evidence of  dissection, occlusion, or hemodynamically significant stenosis (greater than 50%). Skeleton: Redemonstrated sclerotic lesion in the left aspect T5, as seen on the 01/28/2022 study. A punctate lesion in the left aspect of T1 is also noted, unchanged. Hyperdense focus in the left lateral mass of C2 (series 8, image 180), bone island versus additional sclerotic lesion. Other neck: Negative. Upper chest: Scattered nodular opacities in the superior left lower lobe (series  6, image 1), which appears similar to 01/28/2022. No pleural effusion. Review of the MIP images confirms the above findings CTA HEAD FINDINGS Anterior circulation: Both internal carotid arteries are patent to the termini, without significant stenosis. A1 segments patent. Normal anterior communicating artery. Anterior cerebral arteries are patent to their distal aspects. No M1 stenosis or occlusion. MCA branches perfused and symmetric. Posterior circulation: Vertebral arteries patent to the vertebrobasilar junction without stenosis. Posterior inferior cerebellar arteries patent proximally. Basilar patent to its distal aspect. Superior cerebellar arteries patent proximally. Patent P1 segments, diminutive on right. Near fetal origin of the right PCA from a prominent right posterior communicating artery. PCAs perfused to their distal aspects without stenosis. The left posterior communicating artery is not visualized. Venous sinuses: As permitted by contrast timing, patent. Anatomic variants: Near fetal origin of the right PCA. Review of the MIP images confirms the above findings IMPRESSION: 1. No intracranial large vessel occlusion or significant stenosis. 2. No hemodynamically significant stenosis in the neck. 3. Scattered nodular opacities in the superior left lower lobe, similar to 01/28/2022, which may be infectious or inflammatory. 4. Redemonstrated sclerotic lesion in the left aspect of T5, as well as a punctate lesion in the left aspect of T1.  Additional sclerotic focus in the left lateral mass of C2, concerning for an additional site of osseous metastatic disease. 5. Linear hypodensity in the left cerebellum correlates with the acute infarcts seen on the same-day MRI. No additional acute intracranial process. Electronically Signed   By: Merilyn Baba M.D.   On: 02/26/2022 18:48   MR Brain W and Wo Contrast  Result Date: 02/26/2022 CLINICAL DATA:  Brain/CNS neoplasm, assess treatment response EXAM: MRI HEAD WITHOUT AND WITH CONTRAST TECHNIQUE: Multiplanar, multiecho pulse sequences of the brain and surrounding structures were obtained without and with intravenous contrast. CONTRAST:  52mL GADAVIST GADOBUTROL 1 MMOL/ML IV SOLN COMPARISON:  MRI August 14, 2020. FINDINGS: Brain: Similar size of an extra-axial dural-based lesion along the right frontoparietal convexity, which measures 1.4 cm on series 16, image 143. Similar versus slightly decreased conspicuity of a tiny lesion in the posterior temporal lobe (series 16, image 104) with faint susceptibility artifact (series 11, image 33). Otherwise, no abnormal enhancement. No new lesions identified. 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 Slight edema but no mass effect. No evidence of acute hemorrhage or midline shift. No hydrocephalus. Vascular: Major arterial flow voids are maintained at the skull base. Skull and upper cervical spine: Normal marrow signal. Sinuses/Orbits: Clear sinuses.  No acute orbital findings. Other: No mastoid effusions. IMPRESSION: 1. 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 Slight edema but no mass effect. 2. Similar versus slightly decreased conspicuity of a tiny treated lesion the posterior right temporal lobe. No progressive disease. 3. Unchanged extra-axial 1.4 cm lesion along the right chronic temporal convexity, probably a meningioma. Electronically Signed   By: Margaretha Sheffield M.D.   On: 02/26/2022 15:07   DG Chest Port 1 View  Result Date:  02/26/2022 CLINICAL DATA:  Generalized weakness EXAM: PORTABLE CHEST 1 VIEW COMPARISON:  06/30/2020 FINDINGS: Cardiac size is within normal limits. Low position of diaphragms suggests COPD. Small to moderate left pleural effusion is seen. There is interval removal of left chest tube. Increased interstitial markings are seen in left lower lung field. There is no focal consolidation. There is no significant right pleural effusion. There is no pneumothorax. IMPRESSION: Small to moderate left pleural effusion. Increased markings in left lower lung fields  suggest possible interstitial pneumonia. Electronically Signed   By: Elmer Picker M.D.   On: 02/26/2022 14:14     LOS: 1 day   Antonieta Pert, MD [facility]  02/27/2022, 4:03 PM

## 2022-02-27 NOTE — Progress Notes (Signed)
BLE venous duplex has been completed.  Results can be found under chart review under CV PROC. 02/27/2022 1:31 PM Nichola Cieslinski RVT, RDMS

## 2022-02-27 NOTE — ED Notes (Signed)
Pt care taken, walked to bathroom, no complaints at this time.

## 2022-02-27 NOTE — Evaluation (Signed)
Occupational Therapy Evaluation Patient Details Name: Shelley Flores MRN: [id number] DOB: 04-17-1933 Today's Date: 02/27/2022   History of Present Illness Patient is a 88 year old female who presented to the hosptial with 6 day history of gait abnormalities, and weakness. 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 BLE doppler was negative PMH: chemotherapy, stage IV lung cancer.   Clinical Impression   Patient is a 88 year old female who was admitted for above. Patient lives at [place] at [place]. Patient reported she might be able to get caregiver assistance at time of d/c if needed. Patient was noted to need min A for bed mobility, min guard for standing with need for BUE support. Patient was noted to have decreased functional activity tolerance, decreased endurance, decreased standing balance, decreased BUE strength, decreased safety awareness, and decreased knowledge of AD/AE impacting participation in ADLs. Patient would continue to benefit from skilled OT services at this time while admitted and after d/c to address noted deficits in order to improve overall safety and independence in ADLs.        Recommendations for follow up therapy are one component of a multi-disciplinary discharge planning process, led by the attending physician.  Recommendations may be updated based on patient status, additional functional criteria and insurance authorization.   Follow Up Recommendations  Home health OT    Assistance Recommended at Discharge Intermittent Supervision/Assistance  Patient can return home with the following A lot of help with bathing/dressing/bathroom;A little help with walking and/or transfers;Assistance with cooking/housework;Direct supervision/assist for financial management;Assist for transportation;Help with stairs or ramp for entrance;Direct supervision/assist for medications management    Functional Status Assessment  Patient has had a recent decline in  their functional status and demonstrates the ability to make significant improvements in function in a reasonable and predictable amount of time.  Equipment Recommendations       Recommendations for Other Services       Precautions / Restrictions Precautions Precautions: Fall Restrictions Weight Bearing Restrictions: No      Mobility Bed Mobility Overal bed mobility: Needs Assistance Bed Mobility: Supine to Sit     Supine to sit: Min assist, HOB elevated     General bed mobility comments: to scoot to edge of stretcher in ED    Transfers                          Balance Overall balance assessment: Mild deficits observed, not formally tested                                         ADL either performed or assessed with clinical judgement   ADL Overall ADL's : Needs assistance/impaired Eating/Feeding: Set up;Sitting Eating/Feeding Details (indicate cue type and reason): sitting in recliner Grooming: Set up;Sitting;Wash/dry face   Upper Body Bathing: Set up;Sitting   Lower Body Bathing: Minimal assistance;Sit to/from stand;Min guard   Upper Body Dressing : Set up;Sitting   Lower Body Dressing: Minimal assistance;Sit to/from stand;Min guard Lower Body Dressing Details (indicate cue type and reason): patient was able to adjust bilateral socks in the bed. patient needed UE support in standing to maintain balance. Toilet Transfer: Minimal assistance;Rolling walker (2 wheels);Ambulation Toilet Transfer Details (indicate cue type and reason): patient was able to transfer from edge of bed to recliner in room on opposite side of bed. Toileting- Clothing  Manipulation and Hygiene: Minimal assistance;Sit to/from stand       Functional mobility during ADLs: Minimal assistance;Rolling walker (2 wheels)       Vision Baseline Vision/History: 1 Wears glasses Patient Visual Report: No change from baseline Additional Comments: patient reported some  blurriness at times but not at time of eval.     Perception     Praxis      Pertinent Vitals/Pain Pain Assessment Pain Assessment: No/denies pain     Hand Dominance Right   Extremity/Trunk Assessment Upper Extremity Assessment Upper Extremity Assessment: Defer to OT evaluation RUE Deficits / Details: noted to have slight weakness when compared to L side. 3+/5 LUE Deficits / Details: shoulder and grip strenght 3+/5   Lower Extremity Assessment Lower Extremity Assessment: Generalized weakness   Cervical / Trunk Assessment Cervical / Trunk Assessment: Normal   Communication Communication Communication: No difficulties   Cognition Arousal/Alertness: Awake/alert Behavior During Therapy: WFL for tasks assessed/performed Overall Cognitive Status: Within Functional Limits for tasks assessed                                       General Comments       Exercises     Shoulder Instructions      Home Living Family/patient expects to be discharged to:: Private residence Living Arrangements: Alone   Type of Home: Independent living facility Home Access: Level entry     Home Layout: One level               Home Equipment: Rollator (4 wheels)          Prior Functioning/Environment Prior Level of Function : Independent/Modified Independent             Mobility Comments: uses Rollator ADLs Comments: IADL's        OT Problem List: Decreased activity tolerance;Impaired balance (sitting and/or standing);Decreased safety awareness;Decreased knowledge of use of DME or AE;Decreased strength      OT Treatment/Interventions: Self-care/ADL training;Therapeutic exercise;Neuromuscular education;Energy conservation;DME and/or AE instruction;Therapeutic activities;Balance training;Patient/family education    OT Goals(Current goals can be found in the care plan section) Acute Rehab OT Goals Patient Stated Goal: to get stronger OT Goal Formulation:  With patient Time For Goal Achievement: 03/13/22 Potential to Achieve Goals: Fair  OT Frequency: Min 2X/week    Co-evaluation PT/OT/SLP Co-Evaluation/Treatment: Yes Reason for Co-Treatment: For patient/therapist safety PT goals addressed during session: Mobility/safety with mobility OT goals addressed during session: ADL's and self-care      AM-PAC OT "6 Clicks" Daily Activity     Outcome Measure Help from another person eating meals?: A Little Help from another person taking care of personal grooming?: A Little Help from another person toileting, which includes using toliet, bedpan, or urinal?: A Lot Help from another person bathing (including washing, rinsing, drying)?: A Lot Help from another person to put on and taking off regular upper body clothing?: A Little Help from another person to put on and taking off regular lower body clothing?: A Lot 6 Click Score: 15   End of Session Equipment Utilized During Treatment: Rolling walker (2 wheels) Nurse Communication: Mobility status  Activity Tolerance: Patient tolerated treatment well Patient left: in chair;with call bell/phone within reach;with nursing/sitter in room  OT Visit Diagnosis: Unsteadiness on feet (R26.81);Other abnormalities of gait and mobility (R26.89);Muscle weakness (generalized) (M62.81)  Time: 1353-1406 OT Time Calculation (min): 13 min Charges:  OT General Charges $OT Visit: 1 Visit OT Evaluation $OT Eval Low Complexity: 1 Low  Shelley Flores OTR/L, MS Acute Rehabilitation Department Office# [phone]   Feliz Beam Vonya Ohalloran 02/27/2022, 4:03 PM

## 2022-02-28 ENCOUNTER — Inpatient Hospital Stay: Payer: Medicare PPO | Admitting: Internal Medicine

## 2022-02-28 ENCOUNTER — Other Ambulatory Visit: Payer: Self-pay | Admitting: Cardiology

## 2022-02-28 ENCOUNTER — Other Ambulatory Visit (HOSPITAL_COMMUNITY): Payer: Self-pay

## 2022-02-28 ENCOUNTER — Inpatient Hospital Stay: Payer: Medicare PPO

## 2022-02-28 DIAGNOSIS — I639 Cerebral infarction, unspecified: Secondary | ICD-10-CM

## 2022-02-28 DIAGNOSIS — R42 Dizziness and giddiness: Secondary | ICD-10-CM

## 2022-02-28 LAB — PROCALCITONIN: Procalcitonin: <0.1 ng/mL

## 2022-02-28 MED ORDER — AMOXICILLIN-POT CLAVULANATE 875-125 MG PO TABS: 1.0000 | ORAL_TABLET | Freq: Two times a day (BID) | ORAL | 0 refills | Status: AC

## 2022-02-28 MED ORDER — CLOPIDOGREL BISULFATE 75 MG PO TABS: 75.0000 mg | ORAL_TABLET | Freq: Every day | ORAL | 0 refills | Status: AC

## 2022-02-28 NOTE — Progress Notes (Signed)
Physical Therapy Treatment Patient Details Name: Shelley Flores MRN: [id number] DOB: [birth year] Today's Date: 02/28/2022   History of Present Illness Patient is a 88 year old female who presented to the hosptial with 6 day history of gait abnormalities, and weakness. 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 BLE doppler was negative PMH: chemotherapy, stage IV lung cancer.    PT Comments    Patient reports that she feels tired, wants to ambulate more.  Patient  does present with fall risks that are now more  at risk, patient  expresses concern now being on a blood thinner. Patient will benefit from SNF for rehab top improve safety for ADL's/ambulation   Recommendations for follow up therapy are one component of a multi-disciplinary discharge planning process, led by the attending physician.  Recommendations may be updated based on patient status, additional functional criteria and insurance authorization.  Follow Up Recommendations  Skilled nursing-short term rehab (<3 hours/day) Can patient physically be transported by private vehicle: No   Assistance Recommended at Discharge Frequent or constant Supervision/Assistance  Patient can return home with the following A little help with walking and/or transfers;Assistance with cooking/housework;Assist for transportation;A little help with bathing/dressing/bathroom;Help with stairs or ramp for entrance   Equipment Recommendations  None recommended by PT    Recommendations for Other Services       Precautions / Restrictions Precautions Precautions: Fall     Mobility  Bed Mobility   Bed Mobility: Supine to Sit, Sit to Supine     Supine to sit: Min guard, Min assist Sit to supine: Min assist   General bed mobility comments: assist with legs and sccot to bed edge, assist with legs onto bed.    Transfers Overall transfer level: Needs assistance Equipment used: Rolling walker (2 wheels) Transfers: Sit to/from  Stand Sit to Stand: Min guard           General transfer comment: relies on Rw for support, extra effort to rise from the low bed surface    Ambulation/Gait Ambulation/Gait assistance: Min assist Gait Distance (Feet): 60 Feet Assistive device: Rolling walker (2 wheels) Gait Pattern/deviations: Step-through pattern, Staggering right, Staggering left Gait velocity: decr     General Gait Details: steady assist for balance, noted veers to right,  Assist with turning of RW,  uses rollator at baseline, required steady assist frequently.   Stairs             Wheelchair Mobility    Modified Rankin (Stroke Patients Only)       Balance Overall balance assessment: History of Falls Sitting-balance support: Feet supported, No upper extremity supported Sitting balance-Leahy Scale: Fair     Standing balance support: During functional activity, Bilateral upper extremity supported, Reliant on assistive device for balance Standing balance-Leahy Scale: Poor                              Cognition Arousal/Alertness: Awake/alert                                              Exercises      General Comments        Pertinent Vitals/Pain Pain Assessment Pain Assessment: No/denies pain    Home Living  Prior Function            PT Goals (current goals can now be found in the care plan section) Progress towards PT goals: Progressing toward goals    Frequency    Min 3X/week      PT Plan Discharge plan needs to be updated    Co-evaluation              AM-PAC PT "6 Clicks" Mobility   Outcome Measure  Help needed turning from your back to your side while in a flat bed without using bedrails?: A Little Help needed moving from lying on your back to sitting on the side of a flat bed without using bedrails?: A Little Help needed moving to and from a bed to a chair (including a wheelchair)?: A  Little Help needed standing up from a chair using your arms (e.g., wheelchair or bedside chair)?: A Little Help needed to walk in hospital room?: A Lot Help needed climbing 3-5 steps with a railing? : Total 6 Click Score: 15    End of Session Equipment Utilized During Treatment: Gait belt Activity Tolerance: Patient tolerated treatment well Patient left: in bed;with call bell/phone within reach;with bed alarm set Nurse Communication: Mobility status       Time: 1050-1110 PT Time Calculation (min) (ACUTE ONLY): 20 min  Charges:  $Gait Training: 8-22 mins                     [facility] Office [phone] Weekend pager-[phone]    Claretha Cooper 02/28/2022, 11:11 AM

## 2022-02-28 NOTE — TOC Initial Note (Signed)
Transition of Care [facility]) - Initial/Assessment Note    Patient Details  Name: Shelley Flores MRN: [id number] Date of Birth: 1932/11/14  [facility]) CM/SW Contact:    Leeroy Cha, RN Phone Number: 02/28/2022, 7:48 AM  Clinical Narrative:                  Transition of Care [facility]) Screening Note   Patient Details  Name: Shelley Flores Date of Birth: Oct 10, 1932   Transition of Care [facility]) CM/SW Contact:    Leeroy Cha, RN Phone Number: 02/28/2022, 7:48 AM    Transition of Care Department [facility]) has reviewed patient and no TOC needs have been identified at this time. We will continue to monitor patient advancement through interdisciplinary progression rounds. If new patient transition needs arise, please place a TOC consult.    Expected Discharge Plan: Home/Self Care Barriers to Discharge: Continued Medical Work up   Patient Goals and CMS Choice Patient states their goals for this hospitalization and ongoing recovery are:: to return to my home CMS Medicare.gov Compare Post Acute Care list provided to:: Patient    Expected Discharge Plan and Services Expected Discharge Plan: Home/Self Care   Discharge Planning Services: CM Consult   Living arrangements for the past 2 months: Apartment                                      Prior Living Arrangements/Services Living arrangements for the past 2 months: Apartment Lives with:: Self Patient language and need for interpreter reviewed:: Yes Do you feel safe going back to the place where you live?: Yes            Criminal Activity/Legal Involvement Pertinent to Current Situation/Hospitalization: No - Comment as needed  Activities of Daily Living Home Assistive Devices/Equipment: Eyeglasses ADL Screening (condition at time of admission) Patient's cognitive ability adequate to safely complete daily activities?: Yes Is the patient deaf or have difficulty hearing?: No Does the patient  have difficulty seeing, even when wearing glasses/contacts?: No Does the patient have difficulty concentrating, remembering, or making decisions?: No Patient able to express need for assistance with ADLs?: Yes Does the patient have difficulty dressing or bathing?: No Independently performs ADLs?: No Communication: Independent Dressing (OT): Independent Grooming: Independent Feeding: Independent Bathing: Independent Toileting: Needs assistance Is this a change from baseline?: Change from baseline, expected to last >3days In/Out Bed: Needs assistance Is this a change from baseline?: Change from baseline, expected to last >3 days Walks in Home: Needs assistance Is this a change from baseline?: Change from baseline, expected to last >3 days Does the patient have difficulty walking or climbing stairs?: Yes Weakness of Legs: Both Weakness of Arms/Hands: Both  Permission Sought/Granted                  Emotional Assessment Appearance:: Appears stated age Attitude/Demeanor/Rapport: Engaged Affect (typically observed): Calm Orientation: : Oriented to Self, Oriented to Place, Oriented to  Time, Oriented to Situation Alcohol / Substance Use: Alcohol Use (occasional use) Psych Involvement: No (comment)  Admission diagnosis:  Weakness [R53.1] Cerebellar stroke, acute (HCC) [I63.9] Cerebrovascular accident (CVA), unspecified mechanism ([place]) [I63.9] Acute stroke due to ischemia [facility]) [I63.9] Patient Active Problem List   Diagnosis Date Noted   Acute stroke due to ischemia ([place]) 02/27/2022   Cerebellar stroke, acute ([place]) 02/26/2022   Chest tube in place    Candidiasis of  skin 06/27/2020   Petechiae 06/27/2020   Adenocarcinoma of left lung, stage 4 ([place]) 06/07/2020   Lung cancer ([place]) 05/17/2020   Goals of care, counseling/discussion 05/17/2020   Encounter for antineoplastic chemotherapy 05/17/2020   Mass of upper lobe of left lung 05/15/2020   Edema 05/15/2020   OP  (osteoporosis) 05/15/2020   GERD (gastroesophageal reflux disease) 05/15/2020   Hypophosphatemia 05/15/2020   Pleural effusion 05/01/2020   Acute hypoxemic respiratory failure ([place]) 05/01/2020   Hyponatremia 05/01/2020   Hypokalemia 05/01/2020   PVC's (premature ventricular contractions) 02/26/2016   Essential hypertension 12/29/2012   Hyperlipidemia 12/29/2012   Murmur 12/29/2012   PCP:  [name], MD Pharmacy:   [facility] [phone] [place], [place] - [place] [address] Phone: [phone] Fax: [phone]  [id number] [address] [place] [address] Phone: [phone] Fax: [phone]     Social Determinants of Health (SDOH) Interventions    Readmission Risk Interventions   No data to display

## 2022-02-28 NOTE — TOC Progression Note (Addendum)
Transition of Care [facility]) - Progression Note    Patient Details  Name: Shelley Flores MRN: [id number] Date of Birth: Aug 19, 1932  Transition [facility] Care [facility]) CM/SW Contact  Leeroy Cha, RN Phone Number: 02/28/2022, 10:27 AM  Clinical Narrative:    [facility].  Question if alf or snf.  Pt did not recommend snf.  Will need to go by ptar. [facility] [place] [id number].  The facility [facility].  Orders faxed to [facility] at [phone]  Expected Discharge Plan: Home/Self Care Barriers to Discharge: Continued Medical Work up  Expected Discharge Plan and Services Expected Discharge Plan: Home/Self Care   Discharge Planning Services: CM Consult   Living arrangements for the past 2 months: Apartment                                       Social Determinants of Health (SDOH) Interventions    Readmission Risk Interventions   No data to display

## 2022-02-28 NOTE — NC FL2 (Signed)
[place] LEVEL OF CARE SCREENING TOOL     IDENTIFICATION  Patient Name: Shelley Flores Birthdate: [birth year] Sex: female Admission Date (Current Location): 02/26/2022  [facility] and [place] Number:  Herbalist and Address:  [facility],  [place] [address] [place]      Provider Number: [id number]  Attending Physician Name and Address:  Antonieta Pert, MD  Relative Name and Phone Number:       Current Level of Care: Hospital Recommended Level of Care: [place] Prior Approval Number:    Date Approved/Denied:   PASRR Number: [id number]  Discharge Plan: SNF    Current Diagnoses: Patient Active Problem List   Diagnosis Date Noted   Acute stroke due to ischemia ([place]) 02/27/2022   Cerebellar stroke, acute ([place]) 02/26/2022   Chest tube in place    Candidiasis of skin 06/27/2020   Petechiae 06/27/2020   Adenocarcinoma of left lung, stage 4 ([place]) 06/07/2020   Lung cancer ([place]) 05/17/2020   Goals of care, counseling/discussion 05/17/2020   Encounter for antineoplastic chemotherapy 05/17/2020   Mass of upper lobe of left lung 05/15/2020   Edema 05/15/2020   OP (osteoporosis) 05/15/2020   GERD (gastroesophageal reflux disease) 05/15/2020   Hypophosphatemia 05/15/2020   Pleural effusion 05/01/2020   Acute hypoxemic respiratory failure ([place]) 05/01/2020   Hyponatremia 05/01/2020   Hypokalemia 05/01/2020   PVC's (premature ventricular contractions) 02/26/2016   Essential hypertension 12/29/2012   Hyperlipidemia 12/29/2012   Murmur 12/29/2012    Orientation RESPIRATION BLADDER Height & Weight     Self, Time, Situation, Place  Normal Continent Weight: 47.2 kg Height:  5\' 4"  (162.6 cm)  BEHAVIORAL SYMPTOMS/MOOD NEUROLOGICAL BOWEL NUTRITION STATUS      Continent Diet (regular)  AMBULATORY STATUS COMMUNICATION OF NEEDS Skin   Extensive Assist Verbally Normal                       Personal Care Assistance  Level of Assistance  Bathing, Feeding, Dressing Bathing Assistance: Limited assistance Feeding assistance: Limited assistance Dressing Assistance: Limited assistance     Functional Limitations Info  Sight, Hearing, Speech Sight Info: Adequate Hearing Info: Adequate Speech Info: Adequate    SPECIAL CARE FACTORS FREQUENCY  PT (By licensed PT), OT (By licensed OT)     PT Frequency: 5 x weekly OT Frequency: 5 x weekly            Contractures      Additional Factors Info  Code Status Code Status Info: dnr             Current Medications (02/28/2022):  This is the current hospital active medication list Current Facility-Administered Medications  Medication Dose Route Frequency Provider Last Rate Last Admin    stroke: early stages of recovery book   Does not apply Once Eugenie Filler, MD       0.9 %  sodium chloride infusion   Intravenous Continuous Eugenie Filler, MD   Stopped at 02/27/22 1406   acetaminophen (TYLENOL) tablet 650 mg  650 mg Oral Q4H PRN Eugenie Filler, MD       Or   acetaminophen (TYLENOL) 160 MG/5ML solution 650 mg  650 mg Per Tube Q4H PRN Eugenie Filler, MD       Or   acetaminophen (TYLENOL) suppository 650 mg  650 mg Rectal Q4H PRN Eugenie Filler, MD       aspirin EC tablet 81 mg  81 mg Oral Daily Eugenie Filler, MD   81 mg at 02/28/22 1124   atorvastatin (LIPITOR) tablet 20 mg  20 mg Oral q1800 Eugenie Filler, MD   20 mg at 02/27/22 2120   azithromycin (ZITHROMAX) tablet 500 mg  500 mg Oral QHS Eudelia Bunch, RPH   500 mg at 02/27/22 2119   bisacodyl (DULCOLAX) EC tablet 5 mg  5 mg Oral Daily PRN Eugenie Filler, MD       cefTRIAXone (ROCEPHIN) 2 g in sodium chloride 0.9 % 100 mL IVPB  2 g Intravenous Q24H Eugenie Filler, MD   Stopped at 02/27/22 2133   clopidogrel (PLAVIX) tablet 75 mg  75 mg Oral Daily Eugenie Filler, MD   75 mg at 02/28/22 1124   enoxaparin (LOVENOX) injection 30 mg  30 mg Subcutaneous Q24H  Eugenie Filler, MD   30 mg at 70/14/10 3013   folic acid (FOLVITE) tablet 1 mg  1 mg Oral Daily Eugenie Filler, MD   1 mg at 02/28/22 1124   loperamide (IMODIUM) capsule 2 mg  2 mg Oral PRN Eugenie Filler, MD       multivitamin with minerals tablet 1 tablet  1 tablet Oral Daily Eugenie Filler, MD   1 tablet at 02/28/22 1124   ondansetron (ZOFRAN) injection 4 mg  4 mg Intravenous Q6H PRN Eugenie Filler, MD       pantoprazole (PROTONIX) EC tablet 40 mg  40 mg Oral Daily Eugenie Filler, MD   40 mg at 02/28/22 1124   phosphorus (K PHOS NEUTRAL) tablet 500 mg  500 mg Oral TID Antonieta Pert, MD   500 mg at 02/28/22 1124   prochlorperazine (COMPAZINE) tablet 10 mg  10 mg Oral Q6H PRN Eugenie Filler, MD       senna-docusate (Senokot-S) tablet 1 tablet  1 tablet Oral QHS PRN Eugenie Filler, MD         Discharge Medications: Please see discharge summary for a list of discharge medications.  Relevant Imaging Results:  Relevant Lab Results:   Additional Information [phone]  Leeroy Cha, RN

## 2022-02-28 NOTE — TOC Progression Note (Addendum)
Transition of Care [facility]) - Progression Note    Patient Details  Name: Shelley Flores MRN: [id number] Date of Birth: [birth year]  Transition [facility] Care [facility]) CM/SW Contact  Leeroy Cha, RN Phone Number: 02/28/2022, 1:26 PM  Clinical Narrative:    Insurance auth for snf placement started at 1327 Request for physical therapy in home cancelled. Pt is going to snf.  Expected Discharge Plan: [place] Barriers to Discharge: Barriers Resolved  Expected Discharge Plan and Services Expected Discharge Plan: [place]   Discharge Planning Services: CM Consult   Living arrangements for the past 2 months: Apartment Expected Discharge Date: 02/28/22                                     Social Determinants of Health (SDOH) Interventions    Readmission Risk Interventions   No data to display

## 2022-02-28 NOTE — Progress Notes (Signed)
30 day event monitor ordered following CVA. Dr. Debara Pickett to read. Follow up appointment has been arranged.

## 2022-02-28 NOTE — TOC Transition Note (Signed)
Transition of Care [facility]) - CM/SW Discharge Note   Patient Details  Name: Shelley Flores MRN: [id number] Date of Birth: Mar 08, 1933  Transition of Care [facility]) CM/SW Contact:  Leeroy Cha, RN Phone Number: 02/28/2022, 10:57 AM   Clinical Narrative:     Patient to be return via ambulance non emergency .   Final next level of care: [place] Barriers to Discharge: Barriers Resolved   Patient Goals and CMS Choice Patient states their goals for this hospitalization and ongoing recovery are:: to return to my home CMS Medicare.gov Compare Post Acute Care list provided to:: Patient    Discharge Placement                       Discharge Plan and Services   Discharge Planning Services: CM Consult                                 Social Determinants of Health (SDOH) Interventions     Readmission Risk Interventions   No data to display

## 2022-02-28 NOTE — Progress Notes (Signed)
Reviewed chart.   Echo with no embolic source and no afib noted. Continue to suspect it represents small embolus. LDL 67, ok. At this point, I would favor prolonged cardiac monitoring as outpatient, either a wearable like ziopatch or ILR. Dual antiplatelet therapy x 3 weeks from onset of symptoms, then asa 81mg  daily. Appreciate oncology involvement.   Please call with any further questions or concerns.   Roland Rack, MD [facility] [phone]  If 7pm- 7am, please page neurology on call as listed in [place].

## 2022-02-28 NOTE — Discharge Summary (Addendum)
Physician Discharge Summary  Shelley Flores RFX:[id number] DOB: 05-09-1933 DOA: 02/26/2022  PCP: [name], MD  Admit date: 02/26/2022 Discharge date: 03/02/2022 Recommendations for Outpatient Follow-up:  Follow up with PCP in 1 weeks-call for appointment Please obtain BMP/CBC in one week  Discharge Dispo: SNF Discharge Condition: Stable Code Status:   Code Status: DNR Diet recommendation:  Diet Order             Diet Heart Room service appropriate? Yes; Fluid consistency: Thin  Diet effective now                    Brief/Interim Summary: 88 year old female with significant medical history of stage IV lung cancer, on IV chemotherapy last dose 7 days ago presented with generalized weakness gait abnormalities x6 days with increased wobbliness. She was seen in the ED underwent routine evaluation labs, hyperkalemia troponin 32 chest x-ray small to moderate pleural effusion but afebrile normal white counts, MRI small acute infarct in the left cerebellum slight edema but no mass effect increased conspicuity of tiny T2 lesion in the posterior right temporal lobe, no progressive disease 1 cm lesion along the right chronic temporal convexity probably meningioma. Patient was admitted neurology consulted-found to have small acute cerebellar ischemic infarct stroke work-up completed LDL at goal HbA1c 5.7, no atherosclerotic disease in in head and neck.  Echo with EF 65-70%, mild concentric LVH, G1 DD normal RV systolic function normal mitral valve aortic valve tricuspid, no regional wall motion abnormalities no atrial level shunt detected. Telemetry stable  she will continue with PT OT-did well, neurology advised cardiac monitor as outpatient and patient will continue on her antiplatelet regimen as prescribed by neurology.  Made her oncology aware that patient had stroke event after chemotherapy to see if chemotherapy needs to be modified.  She was found to have orthostatic hypotension given  IV fluid boluses, also placed on low-dose midodrine, compression stocking.  She was asymptomatic on standing up- on OV stable on recheck(lying 114/75 sitting 153/83, standing 140/74 and 150/82 heart rate did not go above 107-she will be discharged to SNF.    Discharge Diagnoses:  Principal Problem:   Cerebellar stroke, acute ([place]) Active Problems:   Essential hypertension   Hyperlipidemia   PVC's (premature ventricular contractions)   Hyponatremia   GERD (gastroesophageal reflux disease)   Adenocarcinoma of left lung, stage 4 (HCC)   Acute stroke due to ischemia (HCC) Small acute left cerebellar ischemic infarct: Neurology following, continues to work-up LDL at goal HbA1c 5.7, no atherosclerotic disease in in head and neck.  Echo with EF 65-70%, mild concentric LVH, G1 DD normal RV systolic function normal mitral valve aortic valve tricuspid, no regional wall motion abnormalities no atrial level shunt detected. Telemetry stable  she will continue with PT OT-did well, neurology advised cardiac monitor as outpatient has been arranged.  Patient will continue with DAPT x3 weeks followed with aspirin alone.  Continue home statin.   Orthostatic hypotension symptomatic  Dehydration: Again give bolus fluid placed s/p 1 dose midodrine ( cont prn)> no more tachycardia or hypotensive  on standing-is stable for discharge back   Hypertension advised to hold antihypertensives,  Hyperkalemia-resolved Hypophosphatemia repleted Mildly elevated troponin likely demand ischemia flat- tte done-no acute finding Thrombocytopenia monitor as OP prediabetes HBA1c 5.6 diet education. Chronic hyponatremia-stable Hyperlipidemia LDL at goal, statin to be continued GERD continue PPI Abnormal chest x-ray Small to moderate left pleural effusion, suggestive of interstitial pneumonia, but afebrile with normal white counts  with immunocompromise status stage IV lung cancer on chemotherapy Legionella pneumococcal antigen  negative.  On empiric antibiotics  p.o. repeat chest x-ray 2 pa/lateral.    Consults: Neurology Subjective: Alert awake oriented resting comfortably no complaints.  Discharge Exam: Vitals:   03/02/22 0900 03/02/22 1319  BP:  (!) 141/74  Pulse: 96 83  Resp: 19   Temp: (!) 97.5 F (36.4 C) 98 F (36.7 C)  SpO2: 98% 98%   General: Pt is alert, awake, not in acute distress Cardiovascular: RRR, S1/S2 +, no rubs, no gallops Respiratory: CTA bilaterally, no wheezing, no rhonchi Abdominal: Soft, NT, ND, bowel sounds + Extremities: no edema, no cyanosis  Discharge Instructions  Discharge Instructions     Ambulatory referral to Neurology   Complete by: As directed    An appointment is requested in approximately: 2 weeks   Discharge instructions   Complete by: As directed    Follow-up with PCP, neurology as instructed  Please call call MD or return to ER for similar or worsening recurring problem that brought you to hospital or if any fever,nausea/vomiting,abdominal pain, uncontrolled pain, chest pain,  shortness of breath or any other alarming symptoms.  Please follow-up your doctor as instructed in a week time and call the office for appointment.  Please avoid alcohol, smoking, or any other illicit substance and maintain healthy habits including taking your regular medications as prescribed.  You were cared for by a hospitalist during your hospital stay. If you have any questions about your discharge medications or the care you received while you were in the hospital after you are discharged, you can call the unit and ask to speak with the hospitalist on call if the hospitalist that took care of you is not available.  Once you are discharged, your primary care physician will handle any further medical issues. Please note that NO REFILLS for any discharge medications will be authorized once you are discharged, as it is imperative that you return to your primary care physician (or  establish a relationship with a primary care physician if you do not have one) for your aftercare needs so that they can reassess your need for medications and monitor your lab values   Increase activity slowly   Complete by: As directed       Allergies as of 03/02/2022       Reactions   Irbesartan Nausea Only        Medication List     STOP taking these medications    amLODipine 2.5 MG tablet Commonly known as: NORVASC   loperamide 2 MG capsule Commonly known as: IMODIUM   metoprolol succinate 50 MG 24 hr tablet Commonly known as: TOPROL-XL   multivitamin tablet   spironolactone 25 MG tablet Commonly known as: ALDACTONE   Tagrisso 40 MG tablet Generic drug: osimertinib mesylate       TAKE these medications    acetaminophen 325 MG tablet Commonly known as: TYLENOL Take 2 tablets (650 mg total) by mouth every 6 (six) hours as needed for mild pain (or Fever >/= 101).   amoxicillin-clavulanate 875-125 MG tablet Commonly known as: AUGMENTIN Take 1 tablet by mouth 2 (two) times daily for 5 days.   aspirin 81 MG tablet Take 81 mg by mouth daily.   atorvastatin 20 MG tablet Commonly known as: LIPITOR TAKE 1 TABLET BY MOUTH EVERYDAY AT BEDTIME What changed: See the new instructions.   bisacodyl 5 MG EC tablet Commonly known as: DULCOLAX Take 5 mg by  mouth daily as needed for moderate constipation.   clopidogrel 75 MG tablet Commonly known as: PLAVIX Take 1 tablet (75 mg total) by mouth daily for 20 days.   dexamethasone 4 MG tablet Commonly known as: DECADRON Please take 1 tablet twice a day the day before, the day of, and the day after chemotherapy What changed:  how much to take how to take this when to take this additional instructions   folic acid 1 MG tablet Commonly known as: FOLVITE Take 1 tablet (1 mg total) by mouth daily.   iVIZIA Dry Eyes 0.5 % Soln Generic drug: Povidone (PF) Place 1 drop into both eyes in the morning.   magnesium  hydroxide 400 MG/5ML suspension Commonly known as: MILK OF MAGNESIA Take 15 mLs by mouth daily as needed for mild constipation.   midodrine 2.5 MG tablet Commonly known as: PROAMATINE Take 1 tablet (2.5 mg total) by mouth 2 (two) times daily as needed (if sbp <100 on standing.).   prochlorperazine 10 MG tablet Commonly known as: COMPAZINE Take 1 tablet (10 mg total) by mouth every 6 (six) hours as needed. What changed: reasons to take this         Follow-up Information     [name], MD Follow up in 1 week(s).   Specialty: Internal Medicine Contact information: [place] [address] [phone]                Allergies  Allergen Reactions   Irbesartan Nausea Only    The results of significant diagnostics from this hospitalization (including imaging, microbiology, ancillary and laboratory) are listed below for reference.    Microbiology: Recent Results (from the past 240 hour(s))  Resp Panel by RT-PCR (Flu A&B, Covid) Anterior Nasal Swab     Status: None   Collection Time: 02/26/22  2:05 PM   Specimen: Anterior Nasal Swab  Result Value Ref Range Status   SARS Coronavirus 2 by RT PCR NEGATIVE NEGATIVE Final    Comment: (NOTE) SARS-CoV-2 target nucleic acids are NOT DETECTED.  The SARS-CoV-2 RNA is generally detectable in upper respiratory specimens during the acute phase of infection. The lowest concentration of SARS-CoV-2 viral copies this assay can detect is 138 copies/mL. A negative result does not preclude SARS-Cov-2 infection and should not be used as the sole basis for treatment or other patient management decisions. A negative result may occur with  improper specimen collection/handling, submission of specimen other than nasopharyngeal swab, presence of viral mutation(s) within the areas targeted by this assay, and inadequate number of viral copies(<138 copies/mL). A negative result must be combined with clinical observations,  patient history, and epidemiological information. The expected result is Negative.  Fact Sheet for Patients:  EntrepreneurPulse.com.au  Fact Sheet for Healthcare Providers:  IncredibleEmployment.be  This test is no t yet approved or cleared by the [place] FDA and  has been authorized for detection and/or diagnosis of SARS-CoV-2 by FDA under an Emergency Use Authorization (EUA). This EUA will remain  in effect (meaning this test can be used) for the duration of the COVID-19 declaration under Section 564(b)(1) of the Act, 21 U.S.C.section 360bbb-3(b)(1), unless the authorization is terminated  or revoked sooner.       Influenza A by PCR NEGATIVE NEGATIVE Final   Influenza B by PCR NEGATIVE NEGATIVE Final    Comment: (NOTE) The Xpert Xpress SARS-CoV-2/FLU/RSV plus assay is intended as an aid in the diagnosis of influenza from Nasopharyngeal swab specimens and should  not be used as a sole basis for treatment. Nasal washings and aspirates are unacceptable for Xpert Xpress SARS-CoV-2/FLU/RSV testing.  Fact Sheet for Patients: EntrepreneurPulse.com.au  Fact Sheet for Healthcare Providers: IncredibleEmployment.be  This test is not yet approved or cleared by the [place] FDA and has been authorized for detection and/or diagnosis of SARS-CoV-2 by FDA under an Emergency Use Authorization (EUA). This EUA will remain in effect (meaning this test can be used) for the duration of the COVID-19 declaration under Section 564(b)(1) of the Act, 21 U.S.C. section 360bbb-3(b)(1), unless the authorization is terminated or revoked.  Performed at [facility], [place] [address] [place], [place] [address]     Procedures/Studies: DG Chest 2 View  Result Date: 03/01/2022 CLINICAL DATA:  Pleural effusions.  History of lung cancer. EXAM: CHEST - 2 VIEW COMPARISON:  Chest radiograph dated 02/26/2022.  FINDINGS: Bilateral pleural effusions, left greater than right relatively similar to prior radiograph. Left mid to lower lung field reticulonodular interstitial densities again noted. Left perihilar density there evaluated on the CT. Small linear lucency along the left upper mediastinum to the left of the trachea, likely artifactual. A small left pneumothorax is less likely. Attention on follow-up imaging recommended. The cardiac silhouette is within limits. Atherosclerotic calcification of the aorta. Degenerative changes of the spine. No acute osseous pathology. IMPRESSION: Similar appearance of the lungs with small bilateral pleural effusions. Electronically Signed   By: Anner Crete M.D.   On: 03/01/2022 19:09   VAS [place] LOWER EXTREMITY VENOUS (DVT)  Result Date: 02/27/2022  Lower Venous DVT Study Patient Name:  Shelley Flores  Date of Exam:   02/27/2022 Medical Rec #: [id number]           Accession #:    [id number] Date of Birth: April 17, 1933            Patient Gender: F Patient Age:   8 years Exam Location:  [facility] Procedure:      VAS [place] LOWER EXTREMITY VENOUS (DVT) Referring Phys: MCNEILL KIRKPATRICK --------------------------------------------------------------------------------  Indications: Stroke, and "leg stiffness".  Risk Factors: Chemotherapy. Comparison Study: Previous exam on 05/04/20 was negative for DVT Performing Technologist: Rogelia Rohrer RVT, RDMS  Examination Guidelines: A complete evaluation includes B-mode imaging, spectral Doppler, color Doppler, and power Doppler as needed of all accessible portions of each vessel. Bilateral testing is considered an integral part of a complete examination. Limited examinations for reoccurring indications may be performed as noted. The reflux portion of the exam is performed with the patient in reverse Trendelenburg.  +---------+---------------+---------+-----------+----------+--------------+ RIGHT     CompressibilityPhasicitySpontaneityPropertiesThrombus Aging +---------+---------------+---------+-----------+----------+--------------+ CFV      Full           Yes      Yes                                 +---------+---------------+---------+-----------+----------+--------------+ SFJ      Full                                                        +---------+---------------+---------+-----------+----------+--------------+ FV Prox  Full           Yes      Yes                                 +---------+---------------+---------+-----------+----------+--------------+  FV Mid   Full           Yes      Yes                                 +---------+---------------+---------+-----------+----------+--------------+ FV DistalFull           Yes      Yes                                 +---------+---------------+---------+-----------+----------+--------------+ PFV      Full                                                        +---------+---------------+---------+-----------+----------+--------------+ POP      Full           Yes      Yes                                 +---------+---------------+---------+-----------+----------+--------------+ PTV      Full                                                        +---------+---------------+---------+-----------+----------+--------------+ PERO     Full                                                        +---------+---------------+---------+-----------+----------+--------------+   +---------+---------------+---------+-----------+----------+--------------+ LEFT     CompressibilityPhasicitySpontaneityPropertiesThrombus Aging +---------+---------------+---------+-----------+----------+--------------+ CFV      Full           Yes      Yes                                 +---------+---------------+---------+-----------+----------+--------------+ SFJ      Full                                                         +---------+---------------+---------+-----------+----------+--------------+ FV Prox  Full           Yes      Yes                                 +---------+---------------+---------+-----------+----------+--------------+ FV Mid   Full           Yes      Yes                                 +---------+---------------+---------+-----------+----------+--------------+ FV DistalFull  Yes      Yes                                 +---------+---------------+---------+-----------+----------+--------------+ PFV      Full                                                        +---------+---------------+---------+-----------+----------+--------------+ POP      Full           Yes      Yes                                 +---------+---------------+---------+-----------+----------+--------------+ PTV      Full                                                        +---------+---------------+---------+-----------+----------+--------------+ PERO     Full                                                        +---------+---------------+---------+-----------+----------+--------------+     Summary: BILATERAL: - No evidence of deep vein thrombosis seen in the lower extremities, bilaterally. -No evidence of popliteal cyst, bilaterally.   *See table(s) above for measurements and observations. Electronically signed by Harold Barban MD on 02/27/2022 at 11:50:36 PM.    Final    ECHOCARDIOGRAM COMPLETE  Result Date: 02/27/2022    ECHOCARDIOGRAM REPORT   Patient Name:   Shelley Flores Date of Exam: 02/27/2022 Medical Rec #:  [id number]          Height:       64.0 in Accession #:    [id number]         Weight:       103.0 lb Date of Birth:  Oct 01, 1932           BSA:          1.476 m Patient Age:    24 years           BP:           148/90 mmHg Patient Gender: F                  HR:           83 bpm. Exam Location:  Inpatient Procedure: 2D Echo, Cardiac Doppler and  Color Doppler Indications:    Stroke  History:        Patient has prior history of Echocardiogram examinations, most                 recent 05/04/2020. Risk Factors:Hypertension, Dyslipidemia and                 Former Smoker. Lung cancer stg IV.  Sonographer:    Eartha Inch Referring Phys: [id number] DANIEL V THOMPSON  Sonographer Comments: Technically difficult study due to poor echo windows.  Image acquisition challenging due to patient body habitus and Image acquisition challenging due to respiratory motion. IMPRESSIONS  1. Left ventricular ejection fraction, by estimation, is 65 to 70%. The left ventricle has normal function. The left ventricle has no regional wall motion abnormalities. There is mild concentric left ventricular hypertrophy. Left ventricular diastolic parameters are consistent with Grade I diastolic dysfunction (impaired relaxation).  2. Right ventricular systolic function is normal. The right ventricular size is normal. There is normal pulmonary artery systolic pressure.  3. Right atrial size was mildly dilated.  4. The mitral valve is normal in structure. No evidence of mitral valve regurgitation. No evidence of mitral stenosis.  5. The aortic valve is tricuspid. There is mild calcification of the aortic valve. Aortic valve regurgitation is not visualized. Aortic valve sclerosis/calcification is present, without any evidence of aortic stenosis.  6. The inferior vena cava is normal in size with greater than 50% respiratory variability, suggesting right atrial pressure of 3 mmHg. FINDINGS  Left Ventricle: Left ventricular ejection fraction, by estimation, is 65 to 70%. The left ventricle has normal function. The left ventricle has no regional wall motion abnormalities. The left ventricular internal cavity size was normal in size. There is  mild concentric left ventricular hypertrophy. Left ventricular diastolic parameters are consistent with Grade I diastolic dysfunction (impaired relaxation).  Right Ventricle: The right ventricular size is normal. No increase in right ventricular wall thickness. Right ventricular systolic function is normal. There is normal pulmonary artery systolic pressure. The tricuspid regurgitant velocity is 2.44 m/s, and  with an assumed right atrial pressure of 8 mmHg, the estimated right ventricular systolic pressure is 25.9 mmHg. Left Atrium: Left atrial size was normal in size. Right Atrium: Right atrial size was mildly dilated. Pericardium: There is no evidence of pericardial effusion. Mitral Valve: The mitral valve is normal in structure. Mild mitral annular calcification. No evidence of mitral valve regurgitation. No evidence of mitral valve stenosis. Tricuspid Valve: The tricuspid valve is normal in structure. Tricuspid valve regurgitation is mild . No evidence of tricuspid stenosis. Aortic Valve: The aortic valve is tricuspid. There is mild calcification of the aortic valve. Aortic valve regurgitation is not visualized. Aortic valve sclerosis/calcification is present, without any evidence of aortic stenosis. Pulmonic Valve: The pulmonic valve was normal in structure. Pulmonic valve regurgitation is not visualized. No evidence of pulmonic stenosis. Aorta: The aortic root is normal in size and structure. Venous: The inferior vena cava is normal in size with greater than 50% respiratory variability, suggesting right atrial pressure of 3 mmHg. IAS/Shunts: No atrial level shunt detected by color flow Doppler.  LEFT VENTRICLE PLAX 2D LVIDd:         2.80 cm     Diastology LVIDs:         1.80 cm     LV e' medial:    5.02 cm/s LV PW:         1.00 cm     LV E/e' medial:  17.4 LV IVS:        1.00 cm     LV e' lateral:   3.30 cm/s LVOT diam:     1.80 cm     LV E/e' lateral: 26.5 LV SV:         82 LV SV Index:   56 LVOT Area:     2.54 cm  LV Volumes (MOD) LV vol d, MOD A2C: 44.7 ml LV vol d, MOD A4C: 37.5 ml LV vol s, MOD A2C: 10.6  ml LV vol s, MOD A4C: 8.8 ml LV SV MOD A2C:     34.1  ml LV SV MOD A4C:     37.5 ml LV SV MOD BP:      32.3 ml RIGHT VENTRICLE             IVC RV S prime:     14.90 cm/s  IVC diam: 2.10 cm TAPSE (M-mode): 2.5 cm LEFT ATRIUM           Index        RIGHT ATRIUM           Index LA diam:      2.60 cm 1.76 cm/m   RA Area:     10.60 cm LA Vol (A2C): 44.5 ml 30.16 ml/m  RA Volume:   22.40 ml  15.18 ml/m LA Vol (A4C): 18.7 ml 12.67 ml/m  AORTIC VALVE LVOT Vmax:   137.00 cm/s LVOT Vmean:  100.000 cm/s LVOT VTI:    0.322 m  AORTA Ao Root diam: 2.70 cm Ao Asc diam:  2.90 cm MITRAL VALVE                TRICUSPID VALVE MV Area (PHT): 1.72 cm     TR Peak grad:   23.8 mmHg MV Decel Time: 440 msec     TR Mean grad:   16.0 mmHg MV E velocity: 87.30 cm/s   TR Vmax:        244.00 cm/s MV A velocity: 156.00 cm/s  TR Vmean:       197.0 cm/s MV E/A ratio:  0.56                             SHUNTS                             Systemic VTI:  0.32 m                             Systemic Diam: 1.80 cm Glori Bickers MD Electronically signed by Glori Bickers MD Signature Date/Time: 02/27/2022/4:40:25 PM    Final    CT ANGIO HEAD NECK W WO CM  Result Date: 02/26/2022 CLINICAL DATA:  Weakness after receiving chemotherapy; small acute infarct in the left cerebellum on same-day MRI EXAM: CT ANGIOGRAPHY HEAD AND NECK TECHNIQUE: Multidetector CT imaging of the head and neck was performed using the standard protocol during bolus administration of intravenous contrast. Multiplanar CT image reconstructions and MIPs were obtained to evaluate the vascular anatomy. Carotid stenosis measurements (when applicable) are obtained utilizing NASCET criteria, using the distal internal carotid diameter as the denominator. RADIATION DOSE REDUCTION: This exam was performed according to the departmental dose-optimization program which includes automated exposure control, adjustment of the mA and/or kV according to patient size and/or use of iterative reconstruction technique. CONTRAST:  77mL OMNIPAQUE  IOHEXOL 350 MG/ML SOLN COMPARISON:  No prior CT or CTA, correlation is made with 02/26/2022 MRI head and 01/28/2022 CT chest FINDINGS: CT HEAD FINDINGS Brain: Linear hypodensity in the left cerebellum (series 14, image 7) correlates with the acute infarcts seen on the same-day MRI. No additional acute infarct. The known extra-axial mass along the right frontoparietal convexity is better seen on the same-day MRI (series 14, image 25). The known right posterior temporal lobe lesion is not appreciated on CT. No acute  hemorrhage, mass effect, or midline shift. No hydrocephalus or extra-axial fluid collection. Vascular: No hyperdense vessel. Skull: Normal. Negative for fracture or focal lesion. Sinuses/Orbits: No acute finding. Status post bilateral lens replacements. Other: The mastoid air cells are well aerated. CTA NECK FINDINGS Aortic arch: Standard branching. Imaged portion shows no evidence of aneurysm or dissection. No significant stenosis of the major arch vessel origins. Right carotid system: No evidence of dissection, occlusion, or hemodynamically significant stenosis (greater than 50%). Left carotid system: No evidence of dissection, occlusion, or hemodynamically significant stenosis (greater than 50%). Vertebral arteries: No evidence of dissection, occlusion, or hemodynamically significant stenosis (greater than 50%). Skeleton: Redemonstrated sclerotic lesion in the left aspect T5, as seen on the 01/28/2022 study. A punctate lesion in the left aspect of T1 is also noted, unchanged. Hyperdense focus in the left lateral mass of C2 (series 8, image 180), bone island versus additional sclerotic lesion. Other neck: Negative. Upper chest: Scattered nodular opacities in the superior left lower lobe (series 6, image 1), which appears similar to 01/28/2022. No pleural effusion. Review of the MIP images confirms the above findings CTA HEAD FINDINGS Anterior circulation: Both internal carotid arteries are patent to the  termini, without significant stenosis. A1 segments patent. Normal anterior communicating artery. Anterior cerebral arteries are patent to their distal aspects. No M1 stenosis or occlusion. MCA branches perfused and symmetric. Posterior circulation: Vertebral arteries patent to the vertebrobasilar junction without stenosis. Posterior inferior cerebellar arteries patent proximally. Basilar patent to its distal aspect. Superior cerebellar arteries patent proximally. Patent P1 segments, diminutive on right. Near fetal origin of the right PCA from a prominent right posterior communicating artery. PCAs perfused to their distal aspects without stenosis. The left posterior communicating artery is not visualized. Venous sinuses: As permitted by contrast timing, patent. Anatomic variants: Near fetal origin of the right PCA. Review of the MIP images confirms the above findings IMPRESSION: 1. No intracranial large vessel occlusion or significant stenosis. 2. No hemodynamically significant stenosis in the neck. 3. Scattered nodular opacities in the superior left lower lobe, similar to 01/28/2022, which may be infectious or inflammatory. 4. Redemonstrated sclerotic lesion in the left aspect of T5, as well as a punctate lesion in the left aspect of T1. Additional sclerotic focus in the left lateral mass of C2, concerning for an additional site of osseous metastatic disease. 5. Linear hypodensity in the left cerebellum correlates with the acute infarcts seen on the same-day MRI. No additional acute intracranial process. Electronically Signed   By: Merilyn Baba M.D.   On: 02/26/2022 18:48   MR Brain W and Wo Contrast  Result Date: 02/26/2022 CLINICAL DATA:  Brain/CNS neoplasm, assess treatment response EXAM: MRI HEAD WITHOUT AND WITH CONTRAST TECHNIQUE: Multiplanar, multiecho pulse sequences of the brain and surrounding structures were obtained without and with intravenous contrast. CONTRAST:  64mL GADAVIST GADOBUTROL 1 MMOL/ML  IV SOLN COMPARISON:  MRI August 14, 2020. FINDINGS: Brain: Similar size of an extra-axial dural-based lesion along the right frontoparietal convexity, which measures 1.4 cm on series 16, image 143. Similar versus slightly decreased conspicuity of a tiny lesion in the posterior temporal lobe (series 16, image 104) with faint susceptibility artifact (series 11, image 33). Otherwise, no abnormal enhancement. No new lesions identified. 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 Slight edema but no mass effect. No evidence of acute hemorrhage or midline shift. No hydrocephalus. Vascular: Major arterial flow voids are maintained at the skull base. Skull and upper cervical  spine: Normal marrow signal. Sinuses/Orbits: Clear sinuses.  No acute orbital findings. Other: No mastoid effusions. IMPRESSION: 1. 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 Slight edema but no mass effect. 2. Similar versus slightly decreased conspicuity of a tiny treated lesion the posterior right temporal lobe. No progressive disease. 3. Unchanged extra-axial 1.4 cm lesion along the right chronic temporal convexity, probably a meningioma. Electronically Signed   By: Margaretha Sheffield M.D.   On: 02/26/2022 15:07   DG Chest Port 1 View  Result Date: 02/26/2022 CLINICAL DATA:  Generalized weakness EXAM: PORTABLE CHEST 1 VIEW COMPARISON:  06/30/2020 FINDINGS: Cardiac size is within normal limits. Low position of diaphragms suggests COPD. Small to moderate left pleural effusion is seen. There is interval removal of left chest tube. Increased interstitial markings are seen in left lower lung field. There is no focal consolidation. There is no significant right pleural effusion. There is no pneumothorax. IMPRESSION: Small to moderate left pleural effusion. Increased markings in left lower lung fields suggest possible interstitial pneumonia. Electronically Signed   By: Elmer Picker M.D.   On: 02/26/2022 14:14    Labs: BNP  (last 3 results) No results for input(s): "BNP" in the last 8760 hours. Basic Metabolic Panel: Recent Labs  Lab 02/26/22 1351 02/26/22 1722 02/27/22 0430  NA 130*  --  133*  K 5.3* 4.8 4.8  CL 103  --  108  CO2 22  --  20*  GLUCOSE 116*  --  94  BUN 41*  --  25*  CREATININE 1.05*  --  0.81  CALCIUM 9.8  --  9.3  MG  --   --  2.0  PHOS  --   --  2.2*   Liver Function Tests: Recent Labs  Lab 02/26/22 1351 02/27/22 0430  AST 25 25  ALT 18 18  ALKPHOS 55 56  BILITOT 0.8 0.7  PROT 5.8* 5.5*  ALBUMIN 3.3* 3.0*   No results for input(s): "LIPASE", "AMYLASE" in the last 168 hours. No results for input(s): "AMMONIA" in the last 168 hours. CBC: Recent Labs  Lab 02/26/22 1351 02/27/22 0430  WBC 8.1 6.5  NEUTROABS 7.4 5.1  HGB 12.7 12.7  HCT 37.7 38.1  MCV 91.5 91.1  PLT 149* 132*   Cardiac Enzymes: No results for input(s): "CKTOTAL", "CKMB", "CKMBINDEX", "TROPONINI" in the last 168 hours. BNP: Invalid input(s): "POCBNP" CBG: No results for input(s): "GLUCAP" in the last 168 hours. D-Dimer No results for input(s): "DDIMER" in the last 72 hours. Hgb A1c No results for input(s): "HGBA1C" in the last 72 hours.  Lipid Profile No results for input(s): "CHOL", "HDL", "LDLCALC", "TRIG", "CHOLHDL", "LDLDIRECT" in the last 72 hours.  Thyroid function studies No results for input(s): "TSH", "T4TOTAL", "T3FREE", "THYROIDAB" in the last 72 hours.  Invalid input(s): "FREET3" Anemia work up No results for input(s): "VITAMINB12", "FOLATE", "FERRITIN", "TIBC", "IRON", "RETICCTPCT" in the last 72 hours. Urinalysis    Component Value Date/Time   COLORURINE YELLOW 02/26/2022 1636   APPEARANCEUR CLEAR 02/26/2022 1636   LABSPEC 1.010 02/26/2022 1636   PHURINE 6.0 02/26/2022 1636   GLUCOSEU NEGATIVE 02/26/2022 1636   HGBUR SMALL (A) 02/26/2022 1636   BILIRUBINUR NEGATIVE 02/26/2022 1636   KETONESUR NEGATIVE 02/26/2022 1636   PROTEINUR NEGATIVE 02/26/2022 1636   NITRITE  NEGATIVE 02/26/2022 1636   LEUKOCYTESUR TRACE (A) 02/26/2022 1636   Sepsis Labs Recent Labs  Lab 02/26/22 1351 02/27/22 0430  WBC 8.1 6.5   Microbiology Recent Results (from the past 240  hour(s))  Resp Panel by RT-PCR (Flu A&B, Covid) Anterior Nasal Swab     Status: None   Collection Time: 02/26/22  2:05 PM   Specimen: Anterior Nasal Swab  Result Value Ref Range Status   SARS Coronavirus 2 by RT PCR NEGATIVE NEGATIVE Final    Comment: (NOTE) SARS-CoV-2 target nucleic acids are NOT DETECTED.  The SARS-CoV-2 RNA is generally detectable in upper respiratory specimens during the acute phase of infection. The lowest concentration of SARS-CoV-2 viral copies this assay can detect is 138 copies/mL. A negative result does not preclude SARS-Cov-2 infection and should not be used as the sole basis for treatment or other patient management decisions. A negative result may occur with  improper specimen collection/handling, submission of specimen other than nasopharyngeal swab, presence of viral mutation(s) within the areas targeted by this assay, and inadequate number of viral copies(<138 copies/mL). A negative result must be combined with clinical observations, patient history, and epidemiological information. The expected result is Negative.  Fact Sheet for Patients:  EntrepreneurPulse.com.au  Fact Sheet for Healthcare Providers:  IncredibleEmployment.be  This test is no t yet approved or cleared by the [place] FDA and  has been authorized for detection and/or diagnosis of SARS-CoV-2 by FDA under an Emergency Use Authorization (EUA). This EUA will remain  in effect (meaning this test can be used) for the duration of the COVID-19 declaration under Section 564(b)(1) of the Act, 21 U.S.C.section 360bbb-3(b)(1), unless the authorization is terminated  or revoked sooner.       Influenza A by PCR NEGATIVE NEGATIVE Final   Influenza B by PCR  NEGATIVE NEGATIVE Final    Comment: (NOTE) The Xpert Xpress SARS-CoV-2/FLU/RSV plus assay is intended as an aid in the diagnosis of influenza from Nasopharyngeal swab specimens and should not be used as a sole basis for treatment. Nasal washings and aspirates are unacceptable for Xpert Xpress SARS-CoV-2/FLU/RSV testing.  Fact Sheet for Patients: EntrepreneurPulse.com.au  Fact Sheet for Healthcare Providers: IncredibleEmployment.be  This test is not yet approved or cleared by the [place] FDA and has been authorized for detection and/or diagnosis of SARS-CoV-2 by FDA under an Emergency Use Authorization (EUA). This EUA will remain in effect (meaning this test can be used) for the duration of the COVID-19 declaration under Section 564(b)(1) of the Act, 21 U.S.C. section 360bbb-3(b)(1), unless the authorization is terminated or revoked.  Performed at [facility], [place] [address], [place],  [address]   Time coordinating discharge: 36 minutes  SIGNED: Antonieta Pert, MD  [facility] 03/02/2022, 1:42 PM  If 7PM-7AM, please contact night-coverage [email or web address]

## 2022-02-28 NOTE — Progress Notes (Signed)
DIAGNOSIS: Stage IV (T2b, N2, M1c), non-small cell lung cancer, adenocarcinoma.  The patient presented with a left upper lobe mass, right supra clavicular, left hilar and subcarinal lymphadenopathy as well as diffuse left-sided pleural disease with associated malignant left pleural effusion and 2 small lymph nodes near the SMA in addition to multiple brain metastasis diagnosed in December 2021.    Molecular studies by [employer] showed positive EGFR mutation with deletion in exon 19   PRIOR THERAPY: None   CURRENT THERAPY:  1) palliative systemic chemotherapy with carboplatin for an AUC of 5 Alimta 500 mg per metered squared, and Avastin IV every 3 weeks.  First dose expected on 02/20/22. 2) Tagrisso 80 mg p.o. daily. First dose June 10, 2020.  Status post 17.5 months of treatment.  Starting October 2022 the patient is treated with Tagrisso 40 mg p.o. daily because of the intolerance. The patient had progression in September 2023 but continues to take this due to hx of metastatic disease to the brain.   Subjective: The patient is seen and examined today.  She is feeling fine this morning with no concerning complaints.  She had some dizzy spells and she was admitted to the hospital and MRI of the brain on 02/26/2022 showed small acute infarct in the left cerebellum with slight edema but no mass effect.  There was similar versus slightly decreased conspicuity of a tiny treated lesion in the posterior right temporal lobe with no progressive disease.  She started systemic chemotherapy with carboplatin, Alimta and Avastin on March 22, 2022 and tolerated the first week of her treatment well except for some lack of hydration and weakness.  She denied having any current chest pain, shortness of breath, cough or hemoptysis.  She has no nausea, vomiting, diarrhea or constipation.  Objective: Vital signs in last 24 hours: Temp:  [97.6 F (36.4 C)-97.9 F (36.6 C)] 97.9 F (36.6 C) (10/19 0326) Pulse  Rate:  [81-95] 81 (10/19 0326) Resp:  [16-23] 18 (10/19 0326) BP: (115-142)/(58-74) 135/72 (10/19 0326) SpO2:  [96 %-99 %] 96 % (10/19 0326) Weight:  [104 lb 0.9 oz (47.2 kg)] 104 lb 0.9 oz (47.2 kg) (10/18 2210)  Intake/Output from previous day: 10/18 0701 - 10/19 0700 In: 1561.2 [I.V.:1460; IV Piggyback:101.3] Out: -  Intake/Output this shift: No intake/output data recorded.  General appearance: alert, cooperative, fatigued, and no distress Resp: clear to auscultation bilaterally Cardio: regular rate and rhythm, S1, S2 normal, no murmur, click, rub or gallop GI: soft, non-tender; bowel sounds normal; no masses,  no organomegaly Extremities: extremities normal, atraumatic, no cyanosis or edema  Lab Results:  Recent Labs    02/26/22 1351 02/27/22 0430  WBC 8.1 6.5  HGB 12.7 12.7  HCT 37.7 38.1  PLT 149* 132*   BMET Recent Labs    02/26/22 1351 02/26/22 1722 02/27/22 0430  NA 130*  --  133*  K 5.3* 4.8 4.8  CL 103  --  108  CO2 22  --  20*  GLUCOSE 116*  --  94  BUN 41*  --  25*  CREATININE 1.05*  --  0.81  CALCIUM 9.8  --  9.3    Studies/Results: VAS [place] LOWER EXTREMITY VENOUS (DVT)  Result Date: 02/27/2022  Lower Venous DVT Study Patient Name:  ZARYIA MARKEL  Date of Exam:   02/27/2022 Medical Rec #: [id number]           Accession #:    [id number] Date of Birth: November 22, 1932  Patient Gender: F Patient Age:   88 years Exam Location:  [facility] Procedure:      VAS [place] LOWER EXTREMITY VENOUS (DVT) Referring Phys: MCNEILL KIRKPATRICK --------------------------------------------------------------------------------  Indications: Stroke, and "leg stiffness".  Risk Factors: Chemotherapy. Comparison Study: Previous exam on 05/04/20 was negative for DVT Performing Technologist: Rogelia Rohrer RVT, RDMS  Examination Guidelines: A complete evaluation includes B-mode imaging, spectral Doppler, color Doppler, and power Doppler as needed of all accessible portions of  each vessel. Bilateral testing is considered an integral part of a complete examination. Limited examinations for reoccurring indications may be performed as noted. The reflux portion of the exam is performed with the patient in reverse Trendelenburg.  +---------+---------------+---------+-----------+----------+--------------+ RIGHT    CompressibilityPhasicitySpontaneityPropertiesThrombus Aging +---------+---------------+---------+-----------+----------+--------------+ CFV      Full           Yes      Yes                                 +---------+---------------+---------+-----------+----------+--------------+ SFJ      Full                                                        +---------+---------------+---------+-----------+----------+--------------+ FV Prox  Full           Yes      Yes                                 +---------+---------------+---------+-----------+----------+--------------+ FV Mid   Full           Yes      Yes                                 +---------+---------------+---------+-----------+----------+--------------+ FV DistalFull           Yes      Yes                                 +---------+---------------+---------+-----------+----------+--------------+ PFV      Full                                                        +---------+---------------+---------+-----------+----------+--------------+ POP      Full           Yes      Yes                                 +---------+---------------+---------+-----------+----------+--------------+ PTV      Full                                                        +---------+---------------+---------+-----------+----------+--------------+ PERO     Full                                                        +---------+---------------+---------+-----------+----------+--------------+   +---------+---------------+---------+-----------+----------+--------------+  LEFT      CompressibilityPhasicitySpontaneityPropertiesThrombus Aging +---------+---------------+---------+-----------+----------+--------------+ CFV      Full           Yes      Yes                                 +---------+---------------+---------+-----------+----------+--------------+ SFJ      Full                                                        +---------+---------------+---------+-----------+----------+--------------+ FV Prox  Full           Yes      Yes                                 +---------+---------------+---------+-----------+----------+--------------+ FV Mid   Full           Yes      Yes                                 +---------+---------------+---------+-----------+----------+--------------+ FV DistalFull           Yes      Yes                                 +---------+---------------+---------+-----------+----------+--------------+ PFV      Full                                                        +---------+---------------+---------+-----------+----------+--------------+ POP      Full           Yes      Yes                                 +---------+---------------+---------+-----------+----------+--------------+ PTV      Full                                                        +---------+---------------+---------+-----------+----------+--------------+ PERO     Full                                                        +---------+---------------+---------+-----------+----------+--------------+     Summary: BILATERAL: - No evidence of deep vein thrombosis seen in the lower extremities, bilaterally. -No evidence of popliteal cyst, bilaterally.   *See table(s) above for measurements and observations. Electronically signed by Harold Barban MD on 02/27/2022 at 11:50:36 PM.    Final    ECHOCARDIOGRAM COMPLETE  Result Date: 02/27/2022    ECHOCARDIOGRAM REPORT  Patient Name:   JIAYI LENGACHER Date of Exam: 02/27/2022  Medical Rec #:  [id number]          Height:       64.0 in Accession #:    [id number]         Weight:       103.0 lb Date of Birth:  1933-01-30           BSA:          1.476 m Patient Age:    89 years           BP:           148/90 mmHg Patient Gender: F                  HR:           83 bpm. Exam Location:  Inpatient Procedure: 2D Echo, Cardiac Doppler and Color Doppler Indications:    Stroke  History:        Patient has prior history of Echocardiogram examinations, most                 recent 05/04/2020. Risk Factors:Hypertension, Dyslipidemia and                 Former Smoker. Lung cancer stg IV.  Sonographer:    Eartha Inch Referring Phys: [id number] DANIEL V THOMPSON  Sonographer Comments: Technically difficult study due to poor echo windows. Image acquisition challenging due to patient body habitus and Image acquisition challenging due to respiratory motion. IMPRESSIONS  1. Left ventricular ejection fraction, by estimation, is 65 to 70%. The left ventricle has normal function. The left ventricle has no regional wall motion abnormalities. There is mild concentric left ventricular hypertrophy. Left ventricular diastolic parameters are consistent with Grade I diastolic dysfunction (impaired relaxation).  2. Right ventricular systolic function is normal. The right ventricular size is normal. There is normal pulmonary artery systolic pressure.  3. Right atrial size was mildly dilated.  4. The mitral valve is normal in structure. No evidence of mitral valve regurgitation. No evidence of mitral stenosis.  5. The aortic valve is tricuspid. There is mild calcification of the aortic valve. Aortic valve regurgitation is not visualized. Aortic valve sclerosis/calcification is present, without any evidence of aortic stenosis.  6. The inferior vena cava is normal in size with greater than 50% respiratory variability, suggesting right atrial pressure of 3 mmHg. FINDINGS  Left Ventricle: Left ventricular ejection fraction, by  estimation, is 65 to 70%. The left ventricle has normal function. The left ventricle has no regional wall motion abnormalities. The left ventricular internal cavity size was normal in size. There is  mild concentric left ventricular hypertrophy. Left ventricular diastolic parameters are consistent with Grade I diastolic dysfunction (impaired relaxation). Right Ventricle: The right ventricular size is normal. No increase in right ventricular wall thickness. Right ventricular systolic function is normal. There is normal pulmonary artery systolic pressure. The tricuspid regurgitant velocity is 2.44 m/s, and  with an assumed right atrial pressure of 8 mmHg, the estimated right ventricular systolic pressure is 01.0 mmHg. Left Atrium: Left atrial size was normal in size. Right Atrium: Right atrial size was mildly dilated. Pericardium: There is no evidence of pericardial effusion. Mitral Valve: The mitral valve is normal in structure. Mild mitral annular calcification. No evidence of mitral valve regurgitation. No evidence of mitral valve stenosis. Tricuspid Valve: The tricuspid valve is normal in structure. Tricuspid valve regurgitation is mild . No  evidence of tricuspid stenosis. Aortic Valve: The aortic valve is tricuspid. There is mild calcification of the aortic valve. Aortic valve regurgitation is not visualized. Aortic valve sclerosis/calcification is present, without any evidence of aortic stenosis. Pulmonic Valve: The pulmonic valve was normal in structure. Pulmonic valve regurgitation is not visualized. No evidence of pulmonic stenosis. Aorta: The aortic root is normal in size and structure. Venous: The inferior vena cava is normal in size with greater than 50% respiratory variability, suggesting right atrial pressure of 3 mmHg. IAS/Shunts: No atrial level shunt detected by color flow Doppler.  LEFT VENTRICLE PLAX 2D LVIDd:         2.80 cm     Diastology LVIDs:         1.80 cm     LV e' medial:    5.02 cm/s LV  PW:         1.00 cm     LV E/e' medial:  17.4 LV IVS:        1.00 cm     LV e' lateral:   3.30 cm/s LVOT diam:     1.80 cm     LV E/e' lateral: 26.5 LV SV:         82 LV SV Index:   56 LVOT Area:     2.54 cm  LV Volumes (MOD) LV vol d, MOD A2C: 44.7 ml LV vol d, MOD A4C: 37.5 ml LV vol s, MOD A2C: 10.6 ml LV vol s, MOD A4C: 8.8 ml LV SV MOD A2C:     34.1 ml LV SV MOD A4C:     37.5 ml LV SV MOD BP:      32.3 ml RIGHT VENTRICLE             IVC RV S prime:     14.90 cm/s  IVC diam: 2.10 cm TAPSE (M-mode): 2.5 cm LEFT ATRIUM           Index        RIGHT ATRIUM           Index LA diam:      2.60 cm 1.76 cm/m   RA Area:     10.60 cm LA Vol (A2C): 44.5 ml 30.16 ml/m  RA Volume:   22.40 ml  15.18 ml/m LA Vol (A4C): 18.7 ml 12.67 ml/m  AORTIC VALVE LVOT Vmax:   137.00 cm/s LVOT Vmean:  100.000 cm/s LVOT VTI:    0.322 m  AORTA Ao Root diam: 2.70 cm Ao Asc diam:  2.90 cm MITRAL VALVE                TRICUSPID VALVE MV Area (PHT): 1.72 cm     TR Peak grad:   23.8 mmHg MV Decel Time: 440 msec     TR Mean grad:   16.0 mmHg MV E velocity: 87.30 cm/s   TR Vmax:        244.00 cm/s MV A velocity: 156.00 cm/s  TR Vmean:       197.0 cm/s MV E/A ratio:  0.56                             SHUNTS                             Systemic VTI:  0.32 m  Systemic Diam: 1.80 cm Glori Bickers MD Electronically signed by Glori Bickers MD Signature Date/Time: 02/27/2022/4:40:25 PM    Final    CT ANGIO HEAD NECK W WO CM  Result Date: 02/26/2022 CLINICAL DATA:  Weakness after receiving chemotherapy; small acute infarct in the left cerebellum on same-day MRI EXAM: CT ANGIOGRAPHY HEAD AND NECK TECHNIQUE: Multidetector CT imaging of the head and neck was performed using the standard protocol during bolus administration of intravenous contrast. Multiplanar CT image reconstructions and MIPs were obtained to evaluate the vascular anatomy. Carotid stenosis measurements (when applicable) are obtained utilizing NASCET  criteria, using the distal internal carotid diameter as the denominator. RADIATION DOSE REDUCTION: This exam was performed according to the departmental dose-optimization program which includes automated exposure control, adjustment of the mA and/or kV according to patient size and/or use of iterative reconstruction technique. CONTRAST:  11m OMNIPAQUE IOHEXOL 350 MG/ML SOLN COMPARISON:  No prior CT or CTA, correlation is made with 02/26/2022 MRI head and 01/28/2022 CT chest FINDINGS: CT HEAD FINDINGS Brain: Linear hypodensity in the left cerebellum (series 14, image 7) correlates with the acute infarcts seen on the same-day MRI. No additional acute infarct. The known extra-axial mass along the right frontoparietal convexity is better seen on the same-day MRI (series 14, image 25). The known right posterior temporal lobe lesion is not appreciated on CT. No acute hemorrhage, mass effect, or midline shift. No hydrocephalus or extra-axial fluid collection. Vascular: No hyperdense vessel. Skull: Normal. Negative for fracture or focal lesion. Sinuses/Orbits: No acute finding. Status post bilateral lens replacements. Other: The mastoid air cells are well aerated. CTA NECK FINDINGS Aortic arch: Standard branching. Imaged portion shows no evidence of aneurysm or dissection. No significant stenosis of the major arch vessel origins. Right carotid system: No evidence of dissection, occlusion, or hemodynamically significant stenosis (greater than 50%). Left carotid system: No evidence of dissection, occlusion, or hemodynamically significant stenosis (greater than 50%). Vertebral arteries: No evidence of dissection, occlusion, or hemodynamically significant stenosis (greater than 50%). Skeleton: Redemonstrated sclerotic lesion in the left aspect T5, as seen on the 01/28/2022 study. A punctate lesion in the left aspect of T1 is also noted, unchanged. Hyperdense focus in the left lateral mass of C2 (series 8, image 180), bone  island versus additional sclerotic lesion. Other neck: Negative. Upper chest: Scattered nodular opacities in the superior left lower lobe (series 6, image 1), which appears similar to 01/28/2022. No pleural effusion. Review of the MIP images confirms the above findings CTA HEAD FINDINGS Anterior circulation: Both internal carotid arteries are patent to the termini, without significant stenosis. A1 segments patent. Normal anterior communicating artery. Anterior cerebral arteries are patent to their distal aspects. No M1 stenosis or occlusion. MCA branches perfused and symmetric. Posterior circulation: Vertebral arteries patent to the vertebrobasilar junction without stenosis. Posterior inferior cerebellar arteries patent proximally. Basilar patent to its distal aspect. Superior cerebellar arteries patent proximally. Patent P1 segments, diminutive on right. Near fetal origin of the right PCA from a prominent right posterior communicating artery. PCAs perfused to their distal aspects without stenosis. The left posterior communicating artery is not visualized. Venous sinuses: As permitted by contrast timing, patent. Anatomic variants: Near fetal origin of the right PCA. Review of the MIP images confirms the above findings IMPRESSION: 1. No intracranial large vessel occlusion or significant stenosis. 2. No hemodynamically significant stenosis in the neck. 3. Scattered nodular opacities in the superior left lower lobe, similar to 01/28/2022, which may be infectious or inflammatory. 4. Redemonstrated  sclerotic lesion in the left aspect of T5, as well as a punctate lesion in the left aspect of T1. Additional sclerotic focus in the left lateral mass of C2, concerning for an additional site of osseous metastatic disease. 5. Linear hypodensity in the left cerebellum correlates with the acute infarcts seen on the same-day MRI. No additional acute intracranial process. Electronically Signed   By: Merilyn Baba M.D.   On:  02/26/2022 18:48   MR Brain W and Wo Contrast  Result Date: 02/26/2022 CLINICAL DATA:  Brain/CNS neoplasm, assess treatment response EXAM: MRI HEAD WITHOUT AND WITH CONTRAST TECHNIQUE: Multiplanar, multiecho pulse sequences of the brain and surrounding structures were obtained without and with intravenous contrast. CONTRAST:  31m GADAVIST GADOBUTROL 1 MMOL/ML IV SOLN COMPARISON:  MRI August 14, 2020. FINDINGS: Brain: Similar size of an extra-axial dural-based lesion along the right frontoparietal convexity, which measures 1.4 cm on series 16, image 143. Similar versus slightly decreased conspicuity of a tiny lesion in the posterior temporal lobe (series 16, image 104) with faint susceptibility artifact (series 11, image 33). Otherwise, no abnormal enhancement. No new lesions identified. 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 Slight edema but no mass effect. No evidence of acute hemorrhage or midline shift. No hydrocephalus. Vascular: Major arterial flow voids are maintained at the skull base. Skull and upper cervical spine: Normal marrow signal. Sinuses/Orbits: Clear sinuses.  No acute orbital findings. Other: No mastoid effusions. IMPRESSION: 1. 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 Slight edema but no mass effect. 2. Similar versus slightly decreased conspicuity of a tiny treated lesion the posterior right temporal lobe. No progressive disease. 3. Unchanged extra-axial 1.4 cm lesion along the right chronic temporal convexity, probably a meningioma. Electronically Signed   By: FMargaretha SheffieldM.D.   On: 02/26/2022 15:07   DG Chest Port 1 View  Result Date: 02/26/2022 CLINICAL DATA:  Generalized weakness EXAM: PORTABLE CHEST 1 VIEW COMPARISON:  06/30/2020 FINDINGS: Cardiac size is within normal limits. Low position of diaphragms suggests COPD. Small to moderate left pleural effusion is seen. There is interval removal of left chest tube. Increased interstitial markings are  seen in left lower lung field. There is no focal consolidation. There is no significant right pleural effusion. There is no pneumothorax. IMPRESSION: Small to moderate left pleural effusion. Increased markings in left lower lung fields suggest possible interstitial pneumonia. Electronically Signed   By: PElmer PickerM.D.   On: 02/26/2022 14:14    Medications: I have reviewed the patient's current medications.   Assessment/Plan: This is a very pleasant 88years old white female with Stage IV (T2b, N2, M1c), non-small cell lung cancer, adenocarcinoma.  The patient presented with a left upper lobe mass, right supra clavicular, left hilar and subcarinal lymphadenopathy as well as diffuse left-sided pleural disease with associated malignant left pleural effusion and 2 small lymph nodes near the SMA in addition to multiple brain metastasis diagnosed in December 2021.  Molecular studies by [employer] showed positive EGFR mutation with deletion in exon 19. The patient was treated with targeted therapy with Tagrisso status post 18 months before she had some evidence for disease progression and started systemic chemotherapy with carboplatin, Alimta and Avastin last week.  She tolerated the first week of her treatment well but she presented with dizzy spells and wounds found to have small acute infarct in the left cerebellum.  This is unlikely to be related to her chemotherapy treatment and could be secondary  to dehydration or other cerebrovascular etiology. I recommended for the patient to continue her current treatment for the stroke with Plavix as recommended by neurology. I will arrange for the patient a follow-up appointment with me after discharge for evaluation before the next cycle of her treatment. Thank you so much for taking good care of [name] .  We will continue to follow-up the patient with you and assist in her management on as-needed basis. Disclaimer: This note was dictated with  voice recognition software. Similar sounding words can inadvertently be transcribed and may be missed upon review.   LOS: 2 days    Eilleen Kempf 02/28/2022

## 2022-03-01 ENCOUNTER — Inpatient Hospital Stay (HOSPITAL_COMMUNITY): Payer: Medicare PPO

## 2022-03-01 ENCOUNTER — Encounter: Payer: Self-pay | Admitting: Nurse Practitioner

## 2022-03-01 DIAGNOSIS — I639 Cerebral infarction, unspecified: Secondary | ICD-10-CM | POA: Diagnosis not present

## 2022-03-01 LAB — PROCALCITONIN: Procalcitonin: <0.1 ng/mL

## 2022-03-01 MED ORDER — SODIUM CHLORIDE 0.9 % IV BOLUS
1000.0000 mL | Freq: Once | INTRAVENOUS | Status: AC
  Administered 2022-03-01: 1000 mL via INTRAVENOUS

## 2022-03-01 NOTE — Care Management Important Message (Signed)
Important Message  Patient Details IM Letter given Name: Shelley Flores MRN: [id number] Date of Birth: [birth year]   Medicare Important Message Given:  Yes     Kerin Salen 03/01/2022, 12:50 PM

## 2022-03-01 NOTE — Progress Notes (Signed)
Assist pt to bathroom, after urination, pt stand up feel little lightheadedness, RN introduce pt sit back, pt rest 1 or 2 minutes on toilet stated "feel better" then stand up, walk with front wheel walker back to bed.

## 2022-03-01 NOTE — Progress Notes (Signed)
PROGRESS NOTE TERRA AVENI  YKD:[id number] DOB: Aug 25, 1932 DOA: 02/26/2022 PCP: [name], MD   Brief Narrative/Hospital Course: 88 year old female with significant medical history of stage IV lung cancer, on IV chemotherapy last dose 7 days ago presented with generalized weakness gait abnormalities x6 days with increased wobbliness. She was seen in the ED underwent routine evaluation labs, hyperkalemia troponin 32 chest x-ray small to moderate pleural effusion but afebrile normal white counts, MRI small acute infarct in the left cerebellum slight edema but no mass effect increased conspicuity of tiny T2 lesion in the posterior right temporal lobe, no progressive disease 1 cm lesion along the right chronic temporal convexity probably meningioma. Patient was admitted neurology consulted-found to have small acute cerebellar ischemic infarct stroke work-up completed LDL at goal HbA1c 5.7, no atherosclerotic disease in in head and neck.  Echo with EF 65-70%, mild concentric LVH, G1 DD normal RV systolic function normal mitral valve aortic valve tricuspid, no regional wall motion abnormalities no atrial level shunt detected. Telemetry stable  she will continue with PT OT-did well, neurology advised cardiac monitor as outpatient and patient will continue on her antiplatelet regimen as prescribed by neurology.  Made her oncology aware that patient had stroke event after chemotherapy to see if chemotherapy needs to be modified.  [facility], [facility] is set up and is being discharged back to the facility On on further evaluation by PT she will need to skilled nursing facility, she will be discharged once bed available   Subjective: Seen and examined this morning.  Patient was lightheaded and orthostatic this morning symptomatic and going to the bathroom   Assessment and Plan: Principal Problem:   Cerebellar stroke, acute ([place]) Active Problems:   Essential hypertension    Hyperlipidemia   PVC's (premature ventricular contractions)   Hyponatremia   GERD (gastroesophageal reflux disease)   Adenocarcinoma of left lung, stage 4 (HCC)   Acute stroke due to ischemia (HCC)   Small acute left cerebellar ischemic infarct: Neurology following, continues to work-up LDL at goal HbA1c 5.7, no atherosclerotic disease in in head and neck.  Echo with EF 65-70%, mild concentric LVH, G1 DD normal RV systolic function normal mitral valve aortic valve tricuspid, no regional wall motion abnormalities no atrial level shunt detected. Telemetry stable  she will continue with PT OT-did well, neurology advised cardiac monitor as outpatient has been arranged.  Patient will continue with DAPT x3 weeks followed with aspirin alone.  Continue home statin.  Orthostatic hypotension symptomatic  Dehydration: Give bolus normal saline 1 L.Repeat orthostatics, monitor overnight.  Hypertension resume home meds upon discharge Hyperkalemia-resolved Hypophosphatemia repleted Mildly elevated troponin likely demand ischemia flat- tte done-no acute finding Thrombocytopenia monitor as OP prediabetes HBA1c 5.6 diet education. Chronic hyponatremia-stable Hyperlipidemia LDL at goal, statin to be continued GERD continue PPI Abnormal chest x-ray Small to moderate left pleural effusion, suggestive of interstitial pneumonia, but afebrile with normal white counts with immunocompromise status stage IV lung cancer on chemotherapy Legionella pneumococcal antigen negative.  On empiric antibiotics  p.o. repeat chest x-ray 2 pa/lateral.  DVT prophylaxis: enoxaparin (LOVENOX) injection 30 mg Start: 02/26/22 2200 Code Status:   Code Status: DNR Family Communication: plan of care discussed with patient at bedside. Patient status is: Inpatient because of stroke work-up Level of care: Telemetry   Dispo: [facility]            Anticipated disposition: Risk nursing  facility tomorrow  Mobility Assessment (last 72 hours)     Mobility Assessment     Row Name 03/01/22 1032 02/28/22 2007 02/28/22 1110 02/28/22 0900 02/27/22 1901   Does patient have an order for bedrest or is patient medically unstable -- No - Continue assessment -- No - Continue assessment No - Continue assessment   What is the highest level of mobility based on the progressive mobility assessment? Level 5 (Walks with assist in room/hall) - Balance while stepping forward/back and can walk in room with assist - Complete Level 5 (Walks with assist in room/hall) - Balance while stepping forward/back and can walk in room with assist - Complete Level 5 (Walks with assist in room/hall) - Balance while stepping forward/back and can walk in room with assist - Complete -- Level 5 (Walks with assist in room/hall) - Balance while stepping forward/back and can walk in room with assist - Complete    Row Name 02/27/22 1603 02/27/22 1601         Does patient have an order for bedrest or is patient medically unstable No - Continue assessment --      What is the highest level of mobility based on the progressive mobility assessment? Level 5 (Walks with assist in room/hall) - Balance while stepping forward/back and can walk in room with assist - Complete Level 5 (Walks with assist in room/hall) - Balance while stepping forward/back and can walk in room with assist - Complete                Objective: Vitals last 24 hrs: Vitals:   02/28/22 0326 02/28/22 1244 02/28/22 2106 03/01/22 0359  BP: 135/72 139/67 (!) 146/86 131/84  Pulse: 81 86 92 91  Resp: 18 18 18 18   Temp: 97.9 F (36.6 C) 98.1 F (36.7 C) 97.6 F (36.4 C) 98.3 F (36.8 C)  TempSrc: Oral Oral Oral Oral  SpO2: 96% 99% 99% 96%  Weight:      Height:       Weight change:   Physical Examination: General exam: AA, weak,older appearing HEENT:Oral mucosa moist, Ear/Nose WNL grossly, dentition normal. Respiratory system: bilaterally  clear BS, no use of accessory muscle Cardiovascular system: S1 & S2 +, regular rate, JVD neg. Gastrointestinal system: Abdomen soft, NT,ND,BS+ Nervous System:Alert, awake, moving extremities and grossly nonfocal Extremities: LE ankle edema neg, lower extremities warm Skin: No rashes,no icterus. MSK: Normal muscle bulk,tone, power   Medications reviewed:  Scheduled Meds:   stroke: early stages of recovery book   Does not apply Once   aspirin EC  81 mg Oral Daily   atorvastatin  20 mg Oral q1800   azithromycin  500 mg Oral QHS   clopidogrel  75 mg Oral Daily   enoxaparin (LOVENOX) injection  30 mg Subcutaneous L87F   folic acid  1 mg Oral Daily   multivitamin with minerals  1 tablet Oral Daily   pantoprazole  40 mg Oral Daily   phosphorus  500 mg Oral TID   Continuous Infusions:  cefTRIAXone (ROCEPHIN)  IV 2 g (02/28/22 2006)      Diet Order             Diet Heart Room service appropriate? Yes; Fluid consistency: Thin  Diet effective now                  Intake/Output Summary (Last 24 hours) at 03/01/2022 1312 Last data filed at 02/28/2022 2249 Gross per 24 hour  Intake 240 ml  Output --  Net 240 ml   Net IO Since Admission: 2,421.21 mL [03/01/22 1312]  Wt Readings from Last 3 Encounters:  02/27/22 47.2 kg  02/25/22 46.7 kg  02/20/22 46.2 kg     Unresulted Labs (From admission, onward)    None      Data Reviewed: I have personally reviewed following labs and imaging studies CBC: Recent Labs  Lab 02/26/22 1351 02/27/22 0430  WBC 8.1 6.5  NEUTROABS 7.4 5.1  HGB 12.7 12.7  HCT 37.7 38.1  MCV 91.5 91.1  PLT 149* 132*    Basic Metabolic Panel: Recent Labs  Lab 02/26/22 1351 02/26/22 1722 02/27/22 0430  NA 130*  --  133*  K 5.3* 4.8 4.8  CL 103  --  108  CO2 22  --  20*  GLUCOSE 116*  --  94  BUN 41*  --  25*  CREATININE 1.05*  --  0.81  CALCIUM 9.8  --  9.3  MG  --   --  2.0  PHOS  --   --  2.2*    GFR: Estimated Creatinine Clearance:  36.5 mL/min (by C-G formula based on SCr of 0.81 mg/dL). Liver Function Tests: Recent Labs  Lab 02/26/22 1351 02/27/22 0430  AST 25 25  ALT 18 18  ALKPHOS 55 56  BILITOT 0.8 0.7  PROT 5.8* 5.5*  ALBUMIN 3.3* 3.0*    No results for input(s): "LIPASE", "AMYLASE" in the last 168 hours. No results for input(s): "AMMONIA" in the last 168 hours. Coagulation Profile: No results for input(s): "INR", "PROTIME" in the last 168 hours. BNP (last 3 results) No results for input(s): "PROBNP" in the last 8760 hours. HbA1C:   Antimicrobials: Anti-infectives (From admission, onward)    Start     Dose/Rate Route Frequency Ordered Stop   02/28/22 0000  amoxicillin-clavulanate (AUGMENTIN) 875-125 MG tablet        1 tablet Oral 2 times daily 02/28/22 1034 03/05/22 2359   02/27/22 2200  azithromycin (ZITHROMAX) tablet 500 mg        500 mg Oral Daily at bedtime 02/27/22 1116 03/03/22 2159   02/26/22 1900  cefTRIAXone (ROCEPHIN) 2 g in sodium chloride 0.9 % 100 mL IVPB        2 g 200 mL/hr over 30 Minutes Intravenous Every 24 hours 02/26/22 1722 03/03/22 1859   02/26/22 1900  azithromycin (ZITHROMAX) 500 mg in sodium chloride 0.9 % 250 mL IVPB  Status:  Discontinued        500 mg 250 mL/hr over 60 Minutes Intravenous Every 24 hours 02/26/22 1722 02/27/22 1116      Culture/Microbiology    Component Value Date/Time   SDES PLEURAL FLUID 05/04/2020 1409   SPECREQUEST LEFT LUNG 05/04/2020 1409   CULT  05/04/2020 1409    NO GROWTH Performed at [facility], [place] [address], [place], [place] [address]    REPTSTATUS 05/07/2020 FINAL 05/04/2020 1409    Other culture-see note  Radiology Studies: ECHOCARDIOGRAM COMPLETE  Result Date: 02/27/2022    ECHOCARDIOGRAM REPORT   Patient Name:   RENESHA LIZAMA Date of Exam: 02/27/2022 Medical Rec #:  [id number]          Height:       64.0 in Accession #:    [id number]         Weight:       103.0 lb Date of Birth:  04-30-1933           BSA:  1.476 m Patient Age:    61 years           BP:           148/90 mmHg Patient Gender: F                  HR:           83 bpm. Exam Location:  Inpatient Procedure: 2D Echo, Cardiac Doppler and Color Doppler Indications:    Stroke  History:        Patient has prior history of Echocardiogram examinations, most                 recent 05/04/2020. Risk Factors:Hypertension, Dyslipidemia and                 Former Smoker. Lung cancer stg IV.  Sonographer:    Eartha Inch Referring Phys: [id number] DANIEL V THOMPSON  Sonographer Comments: Technically difficult study due to poor echo windows. Image acquisition challenging due to patient body habitus and Image acquisition challenging due to respiratory motion. IMPRESSIONS  1. Left ventricular ejection fraction, by estimation, is 65 to 70%. The left ventricle has normal function. The left ventricle has no regional wall motion abnormalities. There is mild concentric left ventricular hypertrophy. Left ventricular diastolic parameters are consistent with Grade I diastolic dysfunction (impaired relaxation).  2. Right ventricular systolic function is normal. The right ventricular size is normal. There is normal pulmonary artery systolic pressure.  3. Right atrial size was mildly dilated.  4. The mitral valve is normal in structure. No evidence of mitral valve regurgitation. No evidence of mitral stenosis.  5. The aortic valve is tricuspid. There is mild calcification of the aortic valve. Aortic valve regurgitation is not visualized. Aortic valve sclerosis/calcification is present, without any evidence of aortic stenosis.  6. The inferior vena cava is normal in size with greater than 50% respiratory variability, suggesting right atrial pressure of 3 mmHg. FINDINGS  Left Ventricle: Left ventricular ejection fraction, by estimation, is 65 to 70%. The left ventricle has normal function. The left ventricle has no regional wall motion abnormalities. The left ventricular internal cavity  size was normal in size. There is  mild concentric left ventricular hypertrophy. Left ventricular diastolic parameters are consistent with Grade I diastolic dysfunction (impaired relaxation). Right Ventricle: The right ventricular size is normal. No increase in right ventricular wall thickness. Right ventricular systolic function is normal. There is normal pulmonary artery systolic pressure. The tricuspid regurgitant velocity is 2.44 m/s, and  with an assumed right atrial pressure of 8 mmHg, the estimated right ventricular systolic pressure is 27.7 mmHg. Left Atrium: Left atrial size was normal in size. Right Atrium: Right atrial size was mildly dilated. Pericardium: There is no evidence of pericardial effusion. Mitral Valve: The mitral valve is normal in structure. Mild mitral annular calcification. No evidence of mitral valve regurgitation. No evidence of mitral valve stenosis. Tricuspid Valve: The tricuspid valve is normal in structure. Tricuspid valve regurgitation is mild . No evidence of tricuspid stenosis. Aortic Valve: The aortic valve is tricuspid. There is mild calcification of the aortic valve. Aortic valve regurgitation is not visualized. Aortic valve sclerosis/calcification is present, without any evidence of aortic stenosis. Pulmonic Valve: The pulmonic valve was normal in structure. Pulmonic valve regurgitation is not visualized. No evidence of pulmonic stenosis. Aorta: The aortic root is normal in size and structure. Venous: The inferior vena cava is normal in size with greater than 50% respiratory variability, suggesting right  atrial pressure of 3 mmHg. IAS/Shunts: No atrial level shunt detected by color flow Doppler.  LEFT VENTRICLE PLAX 2D LVIDd:         2.80 cm     Diastology LVIDs:         1.80 cm     LV e' medial:    5.02 cm/s LV PW:         1.00 cm     LV E/e' medial:  17.4 LV IVS:        1.00 cm     LV e' lateral:   3.30 cm/s LVOT diam:     1.80 cm     LV E/e' lateral: 26.5 LV SV:         82  LV SV Index:   56 LVOT Area:     2.54 cm  LV Volumes (MOD) LV vol d, MOD A2C: 44.7 ml LV vol d, MOD A4C: 37.5 ml LV vol s, MOD A2C: 10.6 ml LV vol s, MOD A4C: 8.8 ml LV SV MOD A2C:     34.1 ml LV SV MOD A4C:     37.5 ml LV SV MOD BP:      32.3 ml RIGHT VENTRICLE             IVC RV S prime:     14.90 cm/s  IVC diam: 2.10 cm TAPSE (M-mode): 2.5 cm LEFT ATRIUM           Index        RIGHT ATRIUM           Index LA diam:      2.60 cm 1.76 cm/m   RA Area:     10.60 cm LA Vol (A2C): 44.5 ml 30.16 ml/m  RA Volume:   22.40 ml  15.18 ml/m LA Vol (A4C): 18.7 ml 12.67 ml/m  AORTIC VALVE LVOT Vmax:   137.00 cm/s LVOT Vmean:  100.000 cm/s LVOT VTI:    0.322 m  AORTA Ao Root diam: 2.70 cm Ao Asc diam:  2.90 cm MITRAL VALVE                TRICUSPID VALVE MV Area (PHT): 1.72 cm     TR Peak grad:   23.8 mmHg MV Decel Time: 440 msec     TR Mean grad:   16.0 mmHg MV E velocity: 87.30 cm/s   TR Vmax:        244.00 cm/s MV A velocity: 156.00 cm/s  TR Vmean:       197.0 cm/s MV E/A ratio:  0.56                             SHUNTS                             Systemic VTI:  0.32 m                             Systemic Diam: 1.80 cm Glori Bickers MD Electronically signed by Glori Bickers MD Signature Date/Time: 02/27/2022/4:40:25 PM    Final      LOS: 3 days   Antonieta Pert, MD [facility]  03/01/2022, 1:12 PM

## 2022-03-01 NOTE — Evaluation (Signed)
Speech Language Pathology Evaluation Patient Details Name: Shelley Flores MRN: [id number] DOB: 02-16-1933 Today's Date: 03/01/2022 Time: 1715-1730 SLP Time Calculation (min) (ACUTE ONLY): 15 min  Problem List:  Patient Active Problem List   Diagnosis Date Noted   Acute stroke due to ischemia ([place]) 02/27/2022   Cerebellar stroke, acute ([place]) 02/26/2022   Chest tube in place    Candidiasis of skin 06/27/2020   Petechiae 06/27/2020   Adenocarcinoma of left lung, stage 4 ([place]) 06/07/2020   Lung cancer ([place]) 05/17/2020   Goals of care, counseling/discussion 05/17/2020   Encounter for antineoplastic chemotherapy 05/17/2020   Mass of upper lobe of left lung 05/15/2020   Edema 05/15/2020   OP (osteoporosis) 05/15/2020   GERD (gastroesophageal reflux disease) 05/15/2020   Hypophosphatemia 05/15/2020   Pleural effusion 05/01/2020   Acute hypoxemic respiratory failure ([place]) 05/01/2020   Hyponatremia 05/01/2020   Hypokalemia 05/01/2020   PVC's (premature ventricular contractions) 02/26/2016   Essential hypertension 12/29/2012   Hyperlipidemia 12/29/2012   Murmur 12/29/2012   Past Medical History:  Past Medical History:  Diagnosis Date   Bruit    Abdominal bruit - Abdominal aorta/renal duplex Doppler evaluation 12/07/03 -Mildly abnormal evaluation. 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 This is consistant w/median arcuate ligament compression syndrome. *Right & Left Kidney: Essentially equal in size, symmetrical in shape w/no significant abnormalities visualized. *Right & Left Renal Arteries: No significant abnormalities.   Cancer ([place])    Edema extremities    LE edema    H/O myocardial perfusion scan 02/20/00   To rule out ischemia - Negative adequate Bruce protocol exercise stress test with a deconditioned exercise response and normal static myocardial perfusion images with EF calculated by QGS of 77%. Represents a low risk study.   Hyperlipidemia    Hypertension     Osteoarthritis    Osteoporosis    Pleural effusion 04/2020   Valvular heart disease    Mild valvular heart disease by Echo in 2010, all mild and symptomatic, including concentric LVH, MR, TR, and AI with pulmonary artery pressure of 32. EF was normal.   Past Surgical History:  Past Surgical History:  Procedure Laterality Date   Abdominal aorta/Renal duplex Doppler Evaluation  12/07/03   For abdominal bruit. Mildly abnormal evaluation. (See bruit in medical history)   BREAST EXCISIONAL BIOPSY Left 1966   BREAST EXCISIONAL BIOPSY Left 1999   CATARACT EXTRACTION  2003   CHEST TUBE INSERTION N/A 07/04/2020   Procedure: REMOVAL PLEURAL DRAINAGE CATHETER;  Surgeon: Garner Nash, DO;  Location: [facility];  Service: Pulmonary;  Laterality: N/A;   COLONOSCOPY  10/22/2004   DEXA Bone Scan  06/23/2013   IR PERC PLEURAL DRAIN W/INDWELL CATH W/IMG GUIDE  05/10/2020   IR THORACENTESIS ASP PLEURAL SPACE W/IMG GUIDE  05/04/2020   HPI:  88 yo female with h/o stage IV lung cancer, now acute cva, MRI brain 10/17 Small acute infarct in the left cerebellum. Slight edema,  No progressive disease, Unchanged extra-axial 1.4 cm lesion along the right chronic temporal convexity, probably a meningioma. CXR 10/17 Small to moderate left pleural effusion. LLL increased markings suggest interstitial pneumonia. Pt passed yale 10/17. Speech eval ordered due to CVA.  Pt admits to some progressive memory deficits in the last year but denies changes with this event.   Assessment / Plan / Recommendation Clinical Impression  Pt presents with functional cognitive linguistic and speech/language skills based on [facility] Mental Status Exam.  She scored a perfect  30/30 points on the test!  No dysarthria noted and only minimal right facial asymmetry present.  No SLP follow up indicated as pt at baseline function.  Thanks for this consult.    SLP Assessment  SLP Recommendation/Assessment: Patient does not need any  further Speech [place] [facility] SLP Visit Diagnosis: Dysarthria and anarthria (R47.1)    Recommendations for follow up therapy are one component of a multi-disciplinary discharge planning process, led by the attending physician.  Recommendations may be updated based on patient status, additional functional criteria and insurance authorization.    Follow Up Recommendations  No SLP follow up    Assistance Recommended at Discharge  None  Functional Status Assessment Patient has not had a recent decline in their functional status  Frequency and Duration           SLP Evaluation Cognition  Overall Cognitive Status: Within Functional Limits for tasks assessed Orientation Level: Oriented X4 Year: 2023 Month: October Day of Week: Correct Attention: Selective;Sustained Sustained Attention: Appears intact Selective Attention: Appears intact Memory: Appears intact Awareness: Appears intact Problem Solving: Appears intact       Comprehension  Auditory Comprehension Overall Auditory Comprehension: Appears within functional limits for tasks assessed Yes/No Questions: Not tested Commands: Within Functional Limits Conversation: Complex Visual Recognition/Discrimination Discrimination: Not tested Reading Comprehension Reading Status: Not tested    Expression Expression Primary Mode of Expression: Verbal Verbal Expression Overall Verbal Expression: Appears within functional limits for tasks assessed Initiation: No impairment Level of Generative/Spontaneous Verbalization: Conversation Repetition: No impairment Naming: Not tested Pragmatics: No impairment Non-Verbal Means of Communication: Not applicable Written Expression Dominant Hand: Right Written Expression: Within Functional Limits   Oral / Motor  Oral Motor/Sensory Function Overall Oral Motor/Sensory Function: Mild impairment Facial ROM: Within Functional Limits Facial Symmetry: Abnormal symmetry right Facial  Strength: Within Functional Limits Facial Sensation: Within Functional Limits Lingual ROM: Within Functional Limits Lingual Symmetry: Within Functional Limits Lingual Strength: Within Functional Limits Lingual Sensation: Within Functional Limits Velum: Within Functional Limits Mandible: Within Functional Limits Motor Speech Overall Motor Speech: Appears within functional limits for tasks assessed Respiration: Within functional limits Resonance: Within functional limits Articulation: Within functional limitis Intelligibility: Intelligible Motor Planning: Not tested           Kathleen Lime, MS [place] Office [phone] Pager [phone]  Macario Golds 03/01/2022, 5:44 PM

## 2022-03-01 NOTE — Progress Notes (Signed)
Occupational Therapy Treatment Patient Details Name: Shelley Flores MRN: [id number] DOB: 13-Jul-1932 Today's Date: 03/01/2022   History of present illness Patient is a 88 year old female who presented to the hosptial with 6 day history of gait abnormalities, and weakness. 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 BLE doppler was negative PMH: chemotherapy, stage IV lung cancer.   OT comments  Treatment focused on completing grooming tasks to increase independence in ADLs. Patient supervision for supine to sit and min guard for sit to stand. Patient min guard for ambulation to/from bathroom with walker. While in bathroom patient stated feeling dizzy, patient sat on BSC in bathroom. BP was measured:72/48 in sitting on BSC. Patient was not showing physical symptoms but stating she could pass out and seeing black spots. Patient was then lifted on BSC  to near the bed. BP in sitting after 5 mins 88/59. Patient mod A for sit to supine in bed, and BP in laying 131/80. Patient requires increased assistance, and would not be able to return to independent living facility safely, recommend SNF.   Recommendations for follow up therapy are one component of a multi-disciplinary discharge planning process, led by the attending physician.  Recommendations may be updated based on patient status, additional functional criteria and insurance authorization.    Follow Up Recommendations  Skilled nursing-short term rehab (<3 hours/day)    Assistance Recommended at Discharge Frequent or constant Supervision/Assistance  Patient can return home with the following  A lot of help with bathing/dressing/bathroom;A little help with walking and/or transfers;Assistance with cooking/housework;Direct supervision/assist for financial management;Assist for transportation;Help with stairs or ramp for entrance;Direct supervision/assist for medications management   Equipment Recommendations       Recommendations  for Other Services      Precautions / Restrictions Precautions Precautions: Fall Precaution Comments: monitor BP Restrictions Weight Bearing Restrictions: No       Mobility Bed Mobility Overal bed mobility: Needs Assistance Bed Mobility: Supine to Sit, Sit to Supine     Supine to sit: Supervision Sit to supine: Min assist   General bed mobility comments: assist with legs onto bed.    Transfers Overall transfer level: Needs assistance Equipment used: Rolling walker (2 wheels) Transfers: Sit to/from Stand Sit to Stand: Min guard                 Balance Overall balance assessment: History of Falls Sitting-balance support: Feet supported, No upper extremity supported Sitting balance-Leahy Scale: Fair     Standing balance support: During functional activity, Bilateral upper extremity supported, Reliant on assistive device for balance Standing balance-Leahy Scale: Poor Standing balance comment: uses RW for balance, prior to hospitlization uses rollator                           ADL either performed or assessed with clinical judgement   ADL                                              Extremity/Trunk Assessment Upper Extremity Assessment Upper Extremity Assessment: LUE deficits/detail;RUE deficits/detail RUE Deficits / Details: noted to have slight weakness when compared to L side. 3+/5 LUE Deficits / Details: shoulder and grip strenght 3+/5   Lower Extremity Assessment Lower Extremity Assessment: Defer to PT evaluation   Cervical / Trunk Assessment Cervical / Trunk Assessment:  Normal    Vision Baseline Vision/History: 1 Wears glasses     Perception     Praxis      Cognition Arousal/Alertness: Awake/alert Behavior During Therapy: WFL for tasks assessed/performed Overall Cognitive Status: Within Functional Limits for tasks assessed                                          Exercises      Shoulder  Instructions       General Comments      Pertinent Vitals/ Pain       Pain Assessment Pain Assessment: Faces Faces Pain Scale: No hurt  Home Living                                          Prior Functioning/Environment              Frequency           Progress Toward Goals  OT Goals(current goals can now be found in the care plan section)  Progress towards OT goals: Progressing toward goals     Plan Discharge plan needs to be updated    Co-evaluation                 AM-PAC OT "6 Clicks" Daily Activity     Outcome Measure   Help from another person eating meals?: A Little Help from another person taking care of personal grooming?: A Little Help from another person toileting, which includes using toliet, bedpan, or urinal?: A Little Help from another person bathing (including washing, rinsing, drying)?: A Lot Help from another person to put on and taking off regular upper body clothing?: A Little Help from another person to put on and taking off regular lower body clothing?: A Lot 6 Click Score: 16    End of Session Equipment Utilized During Treatment: Rolling walker (2 wheels)  OT Visit Diagnosis: Unsteadiness on feet (R26.81);Other abnormalities of gait and mobility (R26.89);Muscle weakness (generalized) (M62.81)   Activity Tolerance Treatment limited secondary to medical complications (Comment) (patient orthostatic limiting activity.)   Patient Left with call bell/phone within reach;in bed   Nurse Communication Mobility status (patient is orthostatic after acitivty. monitor)        Time: 3202-3343 OT Time Calculation (min): 24 min  Charges: OT General Charges $OT Visit: 1 Visit OT Treatments $Self Care/Home Management : 23-37 mins  Charlann Lange, OTS Acute rehab services   Charlann Lange 03/01/2022, 11:04 AM

## 2022-03-01 NOTE — Progress Notes (Unsigned)
Location:  [facility] of Service:  SNF (31) Provider:  Biran Mayberry X , NP  Mahlon Gammon, MD  Patient Care Team: Mahlon Gammon, MD as PCP - General (Internal Medicine) Rennis Golden Lisette Abu, MD as PCP - Cardiology (Cardiology) Syliva Overman, RN as Oncology Nurse Navigator (Oncology)  Extended Emergency Contact Information Primary Emergency Contact: Smithwick,Pat Home Phone: [phone] Mobile Phone: [phone] Relation: Friend Secondary Emergency Contact: Janine Limbo Address: [address]          [place], [place] [address] [place] Home Phone: [phone] Work Phone: [phone] Relation: Other  Code Status:  DNR Goals of care: Advanced Directive information    03/01/2022    3:28 PM  Advanced Directives  Does Patient Have a Medical Advance Directive? Yes  Type of Estate agent of [place];Living will  Does patient want to make changes to medical advance directive? No - Patient declined  Copy of Healthcare Power of Attorney in Chart? Yes - validated most recent copy scanned in chart (See row information)     Chief Complaint  Patient presents with   Acute Visit    Hospital follow up    HPI:  [name] seen today for an acute visit for    Past Medical History:  Diagnosis Date   Bruit    Abdominal bruit - Abdominal aorta/renal duplex Doppler evaluation 12/07/03 -Mildly abnormal evaluation. 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 This is consistant w/median arcuate ligament compression syndrome. *Right & Left Kidney: Essentially equal in size, symmetrical in shape w/no significant abnormalities visualized. *Right & Left Renal Arteries: No significant abnormalities.   Cancer (HCC)    Edema extremities    LE edema    H/O myocardial perfusion scan 02/20/00   To rule out ischemia - Negative adequate Bruce protocol exercise stress test with a deconditioned exercise response and  normal static myocardial perfusion images with EF calculated by QGS of 77%. Represents a low risk study.   Hyperlipidemia    Hypertension    Osteoarthritis    Osteoporosis    Pleural effusion 04/2020   Valvular heart disease    Mild valvular heart disease by Echo in 2010, all mild and symptomatic, including concentric LVH, MR, TR, and AI with pulmonary artery pressure of 32. EF was normal.   Past Surgical History:  Procedure Laterality Date   Abdominal aorta/Renal duplex Doppler Evaluation  12/07/03   For abdominal bruit. Mildly abnormal evaluation. (See bruit in medical history)   BREAST EXCISIONAL BIOPSY Left 1966   BREAST EXCISIONAL BIOPSY Left 1999   CATARACT EXTRACTION  2003   CHEST TUBE INSERTION N/A 07/04/2020   Procedure: REMOVAL PLEURAL DRAINAGE CATHETER;  Surgeon: Josephine Igo, DO;  Location: [facility];  Service: Pulmonary;  Laterality: N/A;   COLONOSCOPY  10/22/2004   DEXA Bone Scan  06/23/2013   IR PERC PLEURAL DRAIN W/INDWELL CATH W/IMG GUIDE  05/10/2020   IR THORACENTESIS ASP PLEURAL SPACE W/IMG GUIDE  05/04/2020    Allergies  Allergen Reactions   Irbesartan Nausea Only    Facility-Administered Encounter Medications as of 03/01/2022  Medication    stroke: early stages of recovery book   acetaminophen (TYLENOL) tablet 650 mg   Or   acetaminophen (TYLENOL) 160 MG/5ML solution 650 mg   Or   acetaminophen (TYLENOL) suppository 650 mg   aspirin EC tablet 81 mg   atorvastatin (LIPITOR) tablet 20 mg   azithromycin (  ZITHROMAX) tablet 500 mg   bisacodyl (DULCOLAX) EC tablet 5 mg   cefTRIAXone (ROCEPHIN) 2 g in sodium chloride 0.9 % 100 mL IVPB   clopidogrel (PLAVIX) tablet 75 mg   enoxaparin (LOVENOX) injection 30 mg   folic acid (FOLVITE) tablet 1 mg   loperamide (IMODIUM) capsule 2 mg   multivitamin with minerals tablet 1 tablet   ondansetron (ZOFRAN) injection 4 mg   pantoprazole (PROTONIX) EC tablet 40 mg   phosphorus (K PHOS NEUTRAL) tablet 500 mg    prochlorperazine (COMPAZINE) tablet 10 mg   senna-docusate (Senokot-S) tablet 1 tablet   Outpatient Encounter Medications as of 03/01/2022  Medication Sig   acetaminophen (TYLENOL) 325 MG tablet Take 2 tablets (650 mg total) by mouth every 6 (six) hours as needed for mild pain (or Fever >/= 101).   amLODipine (NORVASC) 2.5 MG tablet TAKE 1 TABLET BY MOUTH EVERY DAY (Patient taking differently: Take 2.5 mg by mouth daily.)   amoxicillin-clavulanate (AUGMENTIN) 875-125 MG tablet Take 1 tablet by mouth 2 (two) times daily for 5 days.   aspirin 81 MG tablet Take 81 mg by mouth daily.   atorvastatin (LIPITOR) 20 MG tablet TAKE 1 TABLET BY MOUTH EVERYDAY AT BEDTIME (Patient taking differently: Take 20 mg by mouth at bedtime.)   bisacodyl (DULCOLAX) 5 MG EC tablet Take 5 mg by mouth daily as needed for moderate constipation.   clopidogrel (PLAVIX) 75 MG tablet Take 1 tablet (75 mg total) by mouth daily for 20 days.   dexamethasone (DECADRON) 4 MG tablet Please take 1 tablet twice a day the day before, the day of, and the day after chemotherapy (Patient taking differently: Take 4 mg by mouth See admin instructions. Take 4 mg by mouth two times a day the day before, day of, and day after chemotherapy)   folic acid (FOLVITE) 1 MG tablet Take 1 tablet (1 mg total) by mouth daily.   IVIZIA DRY EYES 0.5 % SOLN Place 1 drop into both eyes in the morning.   magnesium hydroxide (MILK OF MAGNESIA) 400 MG/5ML suspension Take 15 mLs by mouth daily as needed for mild constipation.   metoprolol succinate (TOPROL-XL) 50 MG 24 hr tablet Take 0.5 tablets (25 mg total) by mouth daily. TAKE WITH OR IMMEDIATELY FOLLOWING A MEAL.   Multiple Vitamin (MULTIVITAMIN) tablet Take 1 tablet by mouth daily.   osimertinib mesylate (TAGRISSO) 40 MG tablet Take 1 tablet (40 mg total) by mouth daily.   prochlorperazine (COMPAZINE) 10 MG tablet Take 1 tablet (10 mg total) by mouth every 6 (six) hours as needed. (Patient taking  differently: Take 10 mg by mouth every 6 (six) hours as needed for nausea or vomiting.)   loperamide (IMODIUM) 2 MG capsule Take 2 mg by mouth as needed for diarrhea or loose stools. (Patient not taking: Reported on 03/01/2022)   spironolactone (ALDACTONE) 25 MG tablet Take 1 tablet (25 mg total) by mouth daily.    Review of Systems  Immunization History  Administered Date(s) Administered   Fluad Quad(high Dose 65+) 01/06/2019, 02/08/2020, 02/27/2022   Influenza Split 08/14/2010, 01/16/2012, 02/02/2013, 01/24/2015, 02/01/2016, 02/19/2017, 01/28/2018   Influenza, High Dose Seasonal PF 01/24/2015, 02/01/2016, 01/28/2018, 02/15/2021   Influenza-Unspecified 02/01/2016   MODERNA COVID-19 SARS-COV-2 PEDS BIVALENT BOOSTER 6Y-11Y 10/24/2021   Moderna SARS-COV2 Booster Vaccination 10/18/2020   Moderna Sars-Covid-2 Vaccination 06/14/2019, 03/27/2020, 03/27/2020   PFIZER(Purple Top)SARS-COV-2 Vaccination 01/31/2021   Pneumococcal Conjugate-13 08/11/2013, 09/03/2013   Pneumococcal Polysaccharide-23 05/28/2004   Td 07/04/2000   Tdap  07/04/2009, 10/18/2019   Unspecified SARS-COV-2 Vaccination 03/27/2020   Zoster, Live 07/02/2005   Pertinent  Health Maintenance Due  Topic Date Due   INFLUENZA VACCINE  Completed   DEXA SCAN  Completed      02/27/2022    4:00 PM 02/27/2022    7:01 PM 02/28/2022    9:00 AM 02/28/2022   11:13 PM 03/01/2022    3:28 PM  Fall Risk  Falls in the past year?     0  Was there an injury with Fall?     0  Fall Risk Category Calculator     0  Fall Risk Category     Low  Patient Fall Risk Level High fall risk High fall risk High fall risk High fall risk High fall risk  Patient at Risk for Falls Due to     History of fall(s);Impaired balance/gait;Impaired mobility  Fall risk Follow up     Falls evaluation completed   Functional Status Survey:    Vitals:   03/01/22 1513  BP: (!) 151/82  Resp: (!) 23  Temp: 97.6 F (36.4 C)  TempSrc: Temporal  Weight: 104 lb  (47.2 kg)  Height: 5\' 4"  (1.626 m)   Body mass index is 17.85 kg/m. Physical Exam  Labs reviewed: Recent Labs    02/20/22 1304 02/26/22 1351 02/26/22 1722 02/27/22 0430  NA 129* 130*  --  133*  K 4.9 5.3* 4.8 4.8  CL 99 103  --  108  CO2 20* 22  --  20*  GLUCOSE 168* 116*  --  94  BUN 41* 41*  --  25*  CREATININE 1.38* 1.05*  --  0.81  CALCIUM 10.9* 9.8  --  9.3  MG  --   --   --  2.0  PHOS  --   --   --  2.2*   Recent Labs    02/20/22 1304 02/26/22 1351 02/27/22 0430  AST 22 25 25   ALT 14 18 18   ALKPHOS 68 55 56  BILITOT 0.5 0.8 0.7  PROT 7.4 5.8* 5.5*  ALBUMIN 4.3 3.3* 3.0*   Recent Labs    02/20/22 1304 02/26/22 1351 02/27/22 0430  WBC 19.9* 8.1 6.5  NEUTROABS 19.2* 7.4 5.1  HGB 13.7 12.7 12.7  HCT 39.7 37.7 38.1  MCV 89.8 91.5 91.1  PLT 256 149* 132*   Lab Results  Component Value Date   TSH 0.995 02/20/2022   Lab Results  Component Value Date   HGBA1C 5.7 (H) 02/26/2022   Lab Results  Component Value Date   CHOL 143 02/27/2022   HDL 59 02/27/2022   LDLCALC 67 02/27/2022   TRIG 83 02/27/2022   CHOLHDL 2.4 02/27/2022    Significant Diagnostic Results in last 30 days:  VAS [place] LOWER EXTREMITY VENOUS (DVT)  Result Date: 02/27/2022  Lower Venous DVT Study Patient Name:  Shelley Flores  Date of Exam:   02/27/2022 Medical Rec #: [id number]           Accession #:    [id number] Date of Birth: 1932/05/29            Patient Gender: F Patient Age:   27 years Exam Location:  [facility] Procedure:      VAS [place] LOWER EXTREMITY VENOUS (DVT) Referring Phys: MCNEILL KIRKPATRICK --------------------------------------------------------------------------------  Indications: Stroke, and "leg stiffness".  Risk Factors: Chemotherapy. Comparison Study: Previous exam on 05/04/20 was negative for DVT Performing Technologist: Ernestene Mention RVT, RDMS  Examination Guidelines:  A complete evaluation includes B-mode imaging, spectral Doppler, color Doppler, and power  Doppler as needed of all accessible portions of each vessel. Bilateral testing is considered an integral part of a complete examination. Limited examinations for reoccurring indications may be performed as noted. The reflux portion of the exam is performed with the patient in reverse Trendelenburg.  +---------+---------------+---------+-----------+----------+--------------+ RIGHT    CompressibilityPhasicitySpontaneityPropertiesThrombus Aging +---------+---------------+---------+-----------+----------+--------------+ CFV      Full           Yes      Yes                                 +---------+---------------+---------+-----------+----------+--------------+ SFJ      Full                                                        +---------+---------------+---------+-----------+----------+--------------+ FV Prox  Full           Yes      Yes                                 +---------+---------------+---------+-----------+----------+--------------+ FV Mid   Full           Yes      Yes                                 +---------+---------------+---------+-----------+----------+--------------+ FV DistalFull           Yes      Yes                                 +---------+---------------+---------+-----------+----------+--------------+ PFV      Full                                                        +---------+---------------+---------+-----------+----------+--------------+ POP      Full           Yes      Yes                                 +---------+---------------+---------+-----------+----------+--------------+ PTV      Full                                                        +---------+---------------+---------+-----------+----------+--------------+ PERO     Full                                                        +---------+---------------+---------+-----------+----------+--------------+    +---------+---------------+---------+-----------+----------+--------------+ LEFT  CompressibilityPhasicitySpontaneityPropertiesThrombus Aging +---------+---------------+---------+-----------+----------+--------------+ CFV      Full           Yes      Yes                                 +---------+---------------+---------+-----------+----------+--------------+ SFJ      Full                                                        +---------+---------------+---------+-----------+----------+--------------+ FV Prox  Full           Yes      Yes                                 +---------+---------------+---------+-----------+----------+--------------+ FV Mid   Full           Yes      Yes                                 +---------+---------------+---------+-----------+----------+--------------+ FV DistalFull           Yes      Yes                                 +---------+---------------+---------+-----------+----------+--------------+ PFV      Full                                                        +---------+---------------+---------+-----------+----------+--------------+ POP      Full           Yes      Yes                                 +---------+---------------+---------+-----------+----------+--------------+ PTV      Full                                                        +---------+---------------+---------+-----------+----------+--------------+ PERO     Full                                                        +---------+---------------+---------+-----------+----------+--------------+     Summary: BILATERAL: - No evidence of deep vein thrombosis seen in the lower extremities, bilaterally. -No evidence of popliteal cyst, bilaterally.   *See table(s) above for measurements and observations. Electronically signed by Coral Else MD on 02/27/2022 at 11:50:36 PM.    Final    ECHOCARDIOGRAM COMPLETE  Result Date: 02/27/2022     ECHOCARDIOGRAM REPORT   Patient Name:   [facility]  Trinna Balloon Date of Exam: 02/27/2022 Medical Rec #:  [id number]          Height:       64.0 in Accession #:    [id number]         Weight:       103.0 lb Date of Birth:  1932-10-18           BSA:          1.476 m Patient Age:    89 years           BP:           148/90 mmHg Patient Gender: F                  HR:           83 bpm. Exam Location:  Inpatient Procedure: 2D Echo, Cardiac Doppler and Color Doppler Indications:    Stroke  History:        Patient has prior history of Echocardiogram examinations, most                 recent 05/04/2020. Risk Factors:Hypertension, Dyslipidemia and                 Former Smoker. Lung cancer stg IV.  Sonographer:    Milda Smart Referring Phys: [id number] DANIEL V THOMPSON  Sonographer Comments: Technically difficult study due to poor echo windows. Image acquisition challenging due to patient body habitus and Image acquisition challenging due to respiratory motion. IMPRESSIONS  1. Left ventricular ejection fraction, by estimation, is 65 to 70%. The left ventricle has normal function. The left ventricle has no regional wall motion abnormalities. There is mild concentric left ventricular hypertrophy. Left ventricular diastolic parameters are consistent with Grade I diastolic dysfunction (impaired relaxation).  2. Right ventricular systolic function is normal. The right ventricular size is normal. There is normal pulmonary artery systolic pressure.  3. Right atrial size was mildly dilated.  4. The mitral valve is normal in structure. No evidence of mitral valve regurgitation. No evidence of mitral stenosis.  5. The aortic valve is tricuspid. There is mild calcification of the aortic valve. Aortic valve regurgitation is not visualized. Aortic valve sclerosis/calcification is present, without any evidence of aortic stenosis.  6. The inferior vena cava is normal in size with greater than 50% respiratory variability, suggesting right atrial  pressure of 3 mmHg. FINDINGS  Left Ventricle: Left ventricular ejection fraction, by estimation, is 65 to 70%. The left ventricle has normal function. The left ventricle has no regional wall motion abnormalities. The left ventricular internal cavity size was normal in size. There is  mild concentric left ventricular hypertrophy. Left ventricular diastolic parameters are consistent with Grade I diastolic dysfunction (impaired relaxation). Right Ventricle: The right ventricular size is normal. No increase in right ventricular wall thickness. Right ventricular systolic function is normal. There is normal pulmonary artery systolic pressure. The tricuspid regurgitant velocity is 2.44 m/s, and  with an assumed right atrial pressure of 8 mmHg, the estimated right ventricular systolic pressure is 31.8 mmHg. Left Atrium: Left atrial size was normal in size. Right Atrium: Right atrial size was mildly dilated. Pericardium: There is no evidence of pericardial effusion. Mitral Valve: The mitral valve is normal in structure. Mild mitral annular calcification. No evidence of mitral valve regurgitation. No evidence of mitral valve stenosis. Tricuspid Valve: The tricuspid valve is normal in structure. Tricuspid valve regurgitation is mild . No evidence of tricuspid stenosis. Aortic Valve:  The aortic valve is tricuspid. There is mild calcification of the aortic valve. Aortic valve regurgitation is not visualized. Aortic valve sclerosis/calcification is present, without any evidence of aortic stenosis. Pulmonic Valve: The pulmonic valve was normal in structure. Pulmonic valve regurgitation is not visualized. No evidence of pulmonic stenosis. Aorta: The aortic root is normal in size and structure. Venous: The inferior vena cava is normal in size with greater than 50% respiratory variability, suggesting right atrial pressure of 3 mmHg. IAS/Shunts: No atrial level shunt detected by color flow Doppler.  LEFT VENTRICLE PLAX 2D LVIDd:          2.80 cm     Diastology LVIDs:         1.80 cm     LV e' medial:    5.02 cm/s LV PW:         1.00 cm     LV E/e' medial:  17.4 LV IVS:        1.00 cm     LV e' lateral:   3.30 cm/s LVOT diam:     1.80 cm     LV E/e' lateral: 26.5 LV SV:         82 LV SV Index:   56 LVOT Area:     2.54 cm  LV Volumes (MOD) LV vol d, MOD A2C: 44.7 ml LV vol d, MOD A4C: 37.5 ml LV vol s, MOD A2C: 10.6 ml LV vol s, MOD A4C: 8.8 ml LV SV MOD A2C:     34.1 ml LV SV MOD A4C:     37.5 ml LV SV MOD BP:      32.3 ml RIGHT VENTRICLE             IVC RV S prime:     14.90 cm/s  IVC diam: 2.10 cm TAPSE (M-mode): 2.5 cm LEFT ATRIUM           Index        RIGHT ATRIUM           Index LA diam:      2.60 cm 1.76 cm/m   RA Area:     10.60 cm LA Vol (A2C): 44.5 ml 30.16 ml/m  RA Volume:   22.40 ml  15.18 ml/m LA Vol (A4C): 18.7 ml 12.67 ml/m  AORTIC VALVE LVOT Vmax:   137.00 cm/s LVOT Vmean:  100.000 cm/s LVOT VTI:    0.322 m  AORTA Ao Root diam: 2.70 cm Ao Asc diam:  2.90 cm MITRAL VALVE                TRICUSPID VALVE MV Area (PHT): 1.72 cm     TR Peak grad:   23.8 mmHg MV Decel Time: 440 msec     TR Mean grad:   16.0 mmHg MV E velocity: 87.30 cm/s   TR Vmax:        244.00 cm/s MV A velocity: 156.00 cm/s  TR Vmean:       197.0 cm/s MV E/A ratio:  0.56                             SHUNTS                             Systemic VTI:  0.32 m  Systemic Diam: 1.80 cm Arvilla Meres MD Electronically signed by Arvilla Meres MD Signature Date/Time: 02/27/2022/4:40:25 PM    Final    CT ANGIO HEAD NECK W WO CM  Result Date: 02/26/2022 CLINICAL DATA:  Weakness after receiving chemotherapy; small acute infarct in the left cerebellum on same-day MRI EXAM: CT ANGIOGRAPHY HEAD AND NECK TECHNIQUE: Multidetector CT imaging of the head and neck was performed using the standard protocol during bolus administration of intravenous contrast. Multiplanar CT image reconstructions and MIPs were obtained to evaluate the vascular  anatomy. Carotid stenosis measurements (when applicable) are obtained utilizing NASCET criteria, using the distal internal carotid diameter as the denominator. RADIATION DOSE REDUCTION: This exam was performed according to the departmental dose-optimization program which includes automated exposure control, adjustment of the mA and/or kV according to patient size and/or use of iterative reconstruction technique. CONTRAST:  75mL OMNIPAQUE IOHEXOL 350 MG/ML SOLN COMPARISON:  No prior CT or CTA, correlation is made with 02/26/2022 MRI head and 01/28/2022 CT chest FINDINGS: CT HEAD FINDINGS Brain: Linear hypodensity in the left cerebellum (series 14, image 7) correlates with the acute infarcts seen on the same-day MRI. No additional acute infarct. The known extra-axial mass along the right frontoparietal convexity is better seen on the same-day MRI (series 14, image 25). The known right posterior temporal lobe lesion is not appreciated on CT. No acute hemorrhage, mass effect, or midline shift. No hydrocephalus or extra-axial fluid collection. Vascular: No hyperdense vessel. Skull: Normal. Negative for fracture or focal lesion. Sinuses/Orbits: No acute finding. Status post bilateral lens replacements. Other: The mastoid air cells are well aerated. CTA NECK FINDINGS Aortic arch: Standard branching. Imaged portion shows no evidence of aneurysm or dissection. No significant stenosis of the major arch vessel origins. Right carotid system: No evidence of dissection, occlusion, or hemodynamically significant stenosis (greater than 50%). Left carotid system: No evidence of dissection, occlusion, or hemodynamically significant stenosis (greater than 50%). Vertebral arteries: No evidence of dissection, occlusion, or hemodynamically significant stenosis (greater than 50%). Skeleton: Redemonstrated sclerotic lesion in the left aspect T5, as seen on the 01/28/2022 study. A punctate lesion in the left aspect of T1 is also noted,  unchanged. Hyperdense focus in the left lateral mass of C2 (series 8, image 180), bone island versus additional sclerotic lesion. Other neck: Negative. Upper chest: Scattered nodular opacities in the superior left lower lobe (series 6, image 1), which appears similar to 01/28/2022. No pleural effusion. Review of the MIP images confirms the above findings CTA HEAD FINDINGS Anterior circulation: Both internal carotid arteries are patent to the termini, without significant stenosis. A1 segments patent. Normal anterior communicating artery. Anterior cerebral arteries are patent to their distal aspects. No M1 stenosis or occlusion. MCA branches perfused and symmetric. Posterior circulation: Vertebral arteries patent to the vertebrobasilar junction without stenosis. Posterior inferior cerebellar arteries patent proximally. Basilar patent to its distal aspect. Superior cerebellar arteries patent proximally. Patent P1 segments, diminutive on right. Near fetal origin of the right PCA from a prominent right posterior communicating artery. PCAs perfused to their distal aspects without stenosis. The left posterior communicating artery is not visualized. Venous sinuses: As permitted by contrast timing, patent. Anatomic variants: Near fetal origin of the right PCA. Review of the MIP images confirms the above findings IMPRESSION: 1. No intracranial large vessel occlusion or significant stenosis. 2. No hemodynamically significant stenosis in the neck. 3. Scattered nodular opacities in the superior left lower lobe, similar to 01/28/2022, which may be infectious or inflammatory. 4. Redemonstrated  sclerotic lesion in the left aspect of T5, as well as a punctate lesion in the left aspect of T1. Additional sclerotic focus in the left lateral mass of C2, concerning for an additional site of osseous metastatic disease. 5. Linear hypodensity in the left cerebellum correlates with the acute infarcts seen on the same-day MRI. No additional  acute intracranial process. Electronically Signed   By: Wiliam Ke M.D.   On: 02/26/2022 18:48   MR Brain W and Wo Contrast  Result Date: 02/26/2022 CLINICAL DATA:  Brain/CNS neoplasm, assess treatment response EXAM: MRI HEAD WITHOUT AND WITH CONTRAST TECHNIQUE: Multiplanar, multiecho pulse sequences of the brain and surrounding structures were obtained without and with intravenous contrast. CONTRAST:  5mL GADAVIST GADOBUTROL 1 MMOL/ML IV SOLN COMPARISON:  MRI August 14, 2020. FINDINGS: Brain: Similar size of an extra-axial dural-based lesion along the right frontoparietal convexity, which measures 1.4 cm on series 16, image 143. Similar versus slightly decreased conspicuity of a tiny lesion in the posterior temporal lobe (series 16, image 104) with faint susceptibility artifact (series 11, image 33). Otherwise, no abnormal enhancement. No new lesions identified. 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 Slight edema but no mass effect. No evidence of acute hemorrhage or midline shift. No hydrocephalus. Vascular: Major arterial flow voids are maintained at the skull base. Skull and upper cervical spine: Normal marrow signal. Sinuses/Orbits: Clear sinuses.  No acute orbital findings. Other: No mastoid effusions. IMPRESSION: 1. 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 Slight edema but no mass effect. 2. Similar versus slightly decreased conspicuity of a tiny treated lesion the posterior right temporal lobe. No progressive disease. 3. Unchanged extra-axial 1.4 cm lesion along the right chronic temporal convexity, probably a meningioma. Electronically Signed   By: Feliberto Harts M.D.   On: 02/26/2022 15:07   DG Chest Port 1 View  Result Date: 02/26/2022 CLINICAL DATA:  Generalized weakness EXAM: PORTABLE CHEST 1 VIEW COMPARISON:  06/30/2020 FINDINGS: Cardiac size is within normal limits. Low position of diaphragms suggests COPD. Small to moderate left pleural effusion is seen.  There is interval removal of left chest tube. Increased interstitial markings are seen in left lower lung field. There is no focal consolidation. There is no significant right pleural effusion. There is no pneumothorax. IMPRESSION: Small to moderate left pleural effusion. Increased markings in left lower lung fields suggest possible interstitial pneumonia. Electronically Signed   By: Ernie Avena M.D.   On: 02/26/2022 14:14    Assessment/Plan There are no diagnoses linked to this encounter.   Family/ staff Communication: ***  Labs/tests ordered:  ***

## 2022-03-01 NOTE — Plan of Care (Signed)
  Problem: Education: Goal: Knowledge of General Education information will improve Description: Including pain rating scale, medication(s)/side effects and non-pharmacologic comfort measures Outcome: Progressing   Problem: Clinical Measurements: Goal: Diagnostic test results will improve Outcome: Progressing   Problem: Safety: Goal: Ability to remain free from injury will improve Outcome: Progressing   

## 2022-03-02 DIAGNOSIS — Z7401 Bed confinement status: Secondary | ICD-10-CM | POA: Diagnosis not present

## 2022-03-02 DIAGNOSIS — I63442 Cerebral infarction due to embolism of left cerebellar artery: Secondary | ICD-10-CM | POA: Diagnosis not present

## 2022-03-02 DIAGNOSIS — E785 Hyperlipidemia, unspecified: Secondary | ICD-10-CM | POA: Diagnosis not present

## 2022-03-02 DIAGNOSIS — I10 Essential (primary) hypertension: Secondary | ICD-10-CM | POA: Diagnosis not present

## 2022-03-02 DIAGNOSIS — R0609 Other forms of dyspnea: Secondary | ICD-10-CM | POA: Diagnosis not present

## 2022-03-02 DIAGNOSIS — D638 Anemia in other chronic diseases classified elsewhere: Secondary | ICD-10-CM | POA: Diagnosis not present

## 2022-03-02 DIAGNOSIS — Z79899 Other long term (current) drug therapy: Secondary | ICD-10-CM | POA: Diagnosis not present

## 2022-03-02 DIAGNOSIS — R590 Localized enlarged lymph nodes: Secondary | ICD-10-CM | POA: Diagnosis not present

## 2022-03-02 DIAGNOSIS — C782 Secondary malignant neoplasm of pleura: Secondary | ICD-10-CM | POA: Diagnosis not present

## 2022-03-02 DIAGNOSIS — R5383 Other fatigue: Secondary | ICD-10-CM | POA: Diagnosis not present

## 2022-03-02 DIAGNOSIS — K219 Gastro-esophageal reflux disease without esophagitis: Secondary | ICD-10-CM | POA: Diagnosis not present

## 2022-03-02 DIAGNOSIS — E782 Mixed hyperlipidemia: Secondary | ICD-10-CM | POA: Diagnosis not present

## 2022-03-02 DIAGNOSIS — C3412 Malignant neoplasm of upper lobe, left bronchus or lung: Secondary | ICD-10-CM | POA: Diagnosis not present

## 2022-03-02 DIAGNOSIS — I639 Cerebral infarction, unspecified: Secondary | ICD-10-CM | POA: Diagnosis not present

## 2022-03-02 DIAGNOSIS — C3432 Malignant neoplasm of lower lobe, left bronchus or lung: Secondary | ICD-10-CM | POA: Diagnosis present

## 2022-03-02 DIAGNOSIS — I493 Ventricular premature depolarization: Secondary | ICD-10-CM | POA: Diagnosis not present

## 2022-03-02 DIAGNOSIS — C778 Secondary and unspecified malignant neoplasm of lymph nodes of multiple regions: Secondary | ICD-10-CM | POA: Diagnosis not present

## 2022-03-02 DIAGNOSIS — Z299 Encounter for prophylactic measures, unspecified: Secondary | ICD-10-CM | POA: Diagnosis not present

## 2022-03-02 DIAGNOSIS — E871 Hypo-osmolality and hyponatremia: Secondary | ICD-10-CM | POA: Diagnosis not present

## 2022-03-02 DIAGNOSIS — Z8673 Personal history of transient ischemic attack (TIA), and cerebral infarction without residual deficits: Secondary | ICD-10-CM | POA: Diagnosis not present

## 2022-03-02 DIAGNOSIS — Z5111 Encounter for antineoplastic chemotherapy: Secondary | ICD-10-CM | POA: Diagnosis present

## 2022-03-02 DIAGNOSIS — I951 Orthostatic hypotension: Secondary | ICD-10-CM | POA: Diagnosis not present

## 2022-03-02 DIAGNOSIS — Z23 Encounter for immunization: Secondary | ICD-10-CM | POA: Diagnosis present

## 2022-03-02 DIAGNOSIS — R000 Tachycardia, unspecified: Secondary | ICD-10-CM | POA: Diagnosis not present

## 2022-03-02 DIAGNOSIS — C3492 Malignant neoplasm of unspecified part of left bronchus or lung: Secondary | ICD-10-CM | POA: Diagnosis not present

## 2022-03-02 DIAGNOSIS — R531 Weakness: Secondary | ICD-10-CM | POA: Diagnosis not present

## 2022-03-02 DIAGNOSIS — R609 Edema, unspecified: Secondary | ICD-10-CM | POA: Diagnosis not present

## 2022-03-02 MED ORDER — MIDODRINE HCL 5 MG PO TABS
2.5000 mg | ORAL_TABLET | Freq: Three times a day (TID) | ORAL | Status: DC
  Administered 2022-03-02: 2.5 mg via ORAL
  Filled 2022-03-02: qty 1

## 2022-03-02 MED ORDER — MIDODRINE HCL 2.5 MG PO TABS: 2.5000 mg | ORAL_TABLET | Freq: Two times a day (BID) | ORAL | 0 refills | Status: DC | PRN

## 2022-03-02 MED ORDER — SODIUM CHLORIDE 0.9 % IV BOLUS
500.0000 mL | Freq: Once | INTRAVENOUS | Status: AC
  Administered 2022-03-02: 500 mL via INTRAVENOUS

## 2022-03-02 NOTE — Plan of Care (Signed)
  Problem: Education: Goal: Knowledge of General Education information will improve Description: Including pain rating scale, medication(s)/side effects and non-pharmacologic comfort measures 03/02/2022 0751 by Lennie Hummer, RN Outcome: Progressing 03/01/2022 1811 by Lennie Hummer, RN Outcome: Progressing   Problem: Clinical Measurements: Goal: Diagnostic test results will improve Outcome: Progressing   Problem: Safety: Goal: Ability to remain free from injury will improve Outcome: Progressing   Problem: Education: Goal: Knowledge of secondary prevention will improve (MUST DOCUMENT ALL) Outcome: Progressing Goal: Knowledge of patient specific risk factors will improve Elta [place] N/A or DELETE if not current risk factor) Outcome: Progressing   Problem: Ischemic Stroke/TIA Tissue Perfusion: Goal: Complications of ischemic stroke/TIA will be minimized Outcome: Progressing   Problem: Self-Care: Goal: Ability to participate in self-care as condition permits will improve Outcome: Progressing   Problem: Nutrition: Goal: Risk of aspiration will decrease Outcome: Progressing

## 2022-03-02 NOTE — Progress Notes (Signed)
Mobility Specialist - Progress Note   03/02/22 1216  Mobility  Activity Dangled on edge of bed;Stood at bedside  Level of Assistance Contact guard assist, steadying assist  Assistive Device None  Range of Motion/Exercises Right leg;Left leg  Activity Response Tolerated well  Mobility Referral Yes  $Mobility charge 1 Mobility   Pt received in bed and agreeable to mobility. Per pt request vitals were checked standing & sitting EOB. Pt opted out to do EOB exercises & then requested to ambulate in the afternoon due to elevated HR. Assisted pt w/ leg exercises. Pt to bed after session with all needs met.      Siting up (EOB): 93bpm HR, 141/86 BP, 98% SpO2 Standing: 115bpm HR, 127/72(88) BP, 100% SpO2     Set designer

## 2022-03-02 NOTE — Plan of Care (Signed)
  Problem: Education: Goal: Knowledge of General Education information will improve Description: Including pain rating scale, medication(s)/side effects and non-pharmacologic comfort measures 03/02/2022 1647 by Lennie Hummer, RN Outcome: Adequate for Discharge 03/02/2022 0751 by Lennie Hummer, RN Outcome: Progressing   Problem: Health Behavior/Discharge Planning: Goal: Ability to manage health-related needs will improve Outcome: Adequate for Discharge   Problem: Clinical Measurements: Goal: Ability to maintain clinical measurements within normal limits will improve Outcome: Adequate for Discharge Goal: Will remain free from infection Outcome: Adequate for Discharge Goal: Diagnostic test results will improve Outcome: Adequate for Discharge Goal: Respiratory complications will improve Outcome: Adequate for Discharge Goal: Cardiovascular complication will be avoided Outcome: Adequate for Discharge   Problem: Activity: Goal: Risk for activity intolerance will decrease Outcome: Adequate for Discharge   Problem: Nutrition: Goal: Adequate nutrition will be maintained Outcome: Adequate for Discharge   Problem: Coping: Goal: Level of anxiety will decrease Outcome: Adequate for Discharge   Problem: Elimination: Goal: Will not experience complications related to bowel motility Outcome: Adequate for Discharge Goal: Will not experience complications related to urinary retention Outcome: Adequate for Discharge   Problem: Pain Managment: Goal: General experience of comfort will improve Outcome: Adequate for Discharge   Problem: Safety: Goal: Ability to remain free from injury will improve Outcome: Adequate for Discharge   Problem: Skin Integrity: Goal: Risk for impaired skin integrity will decrease Outcome: Adequate for Discharge   Problem: Education: Goal: Knowledge of disease or condition will improve Outcome: Adequate for Discharge Goal: Knowledge of secondary  prevention will improve (MUST DOCUMENT ALL) 03/02/2022 1647 by Lennie Hummer, RN Outcome: Adequate for Discharge 03/02/2022 0751 by Lennie Hummer, RN Outcome: Progressing Goal: Knowledge of patient specific risk factors will improve Shelley Flores N/A or DELETE if not current risk factor) 03/02/2022 1647 by Lennie Hummer, RN Outcome: Adequate for Discharge 03/02/2022 0751 by Lennie Hummer, RN Outcome: Progressing   Problem: Ischemic Stroke/TIA Tissue Perfusion: Goal: Complications of ischemic stroke/TIA will be minimized 03/02/2022 1647 by Lennie Hummer, RN Outcome: Adequate for Discharge 03/02/2022 0751 by Lennie Hummer, RN Outcome: Progressing   Problem: Coping: Goal: Will verbalize positive feelings about self Outcome: Adequate for Discharge Goal: Will identify appropriate support needs Outcome: Adequate for Discharge   Problem: Health Behavior/Discharge Planning: Goal: Ability to manage health-related needs will improve Outcome: Adequate for Discharge Goal: Goals will be collaboratively established with patient/family Outcome: Adequate for Discharge   Problem: Self-Care: Goal: Ability to participate in self-care as condition permits will improve 03/02/2022 1647 by Lennie Hummer, RN Outcome: Adequate for Discharge 03/02/2022 0751 by Lennie Hummer, RN Outcome: Progressing Goal: Verbalization of feelings and concerns over difficulty with self-care will improve Outcome: Adequate for Discharge Goal: Ability to communicate needs accurately will improve Outcome: Adequate for Discharge   Problem: Nutrition: Goal: Risk of aspiration will decrease 03/02/2022 1647 by Lennie Hummer, RN Outcome: Adequate for Discharge 03/02/2022 0751 by Lennie Hummer, RN Outcome: Progressing Goal: Dietary intake will improve Outcome: Adequate for Discharge

## 2022-03-02 NOTE — Progress Notes (Signed)
Mobility Specialist - Progress Note   03/02/22 1600  Mobility  Activity Stood at bedside  Level of Assistance Contact guard assist, steadying assist  Assistive Device Front wheel walker  Range of Motion/Exercises Active  Activity Response Tolerated well  Mobility Referral Yes  $Mobility charge 1 Mobility   Pt was found in bed and agreeable to bed mobility and standing. Pt did x10 lateral theraband pulls on both arms, x3 practicing sitting to standing, and x10 marching steps on each leg. At EOS returned to bed with all necessities in reach.  Ferd Hibbs Mobility Specialist

## 2022-03-02 NOTE — Progress Notes (Signed)
Unable to contact the receiving nurse at [facility] unit for report. 2 attempts made contact [phone]).

## 2022-03-02 NOTE — Progress Notes (Signed)
PROGRESS NOTE Shelley Flores  WER:[id number] DOB: January 12, 1933 DOA: 02/26/2022 PCP: [name], MD   Brief Narrative/Hospital Course: 88 year old female with significant medical history of stage IV lung cancer, on IV chemotherapy last dose 7 days ago presented with generalized weakness gait abnormalities x6 days with increased wobbliness. She was seen in the ED underwent routine evaluation labs, hyperkalemia troponin 32 chest x-ray small to moderate pleural effusion but afebrile normal white counts, MRI small acute infarct in the left cerebellum slight edema but no mass effect increased conspicuity of tiny T2 lesion in the posterior right temporal lobe, no progressive disease 1 cm lesion along the right chronic temporal convexity probably meningioma. Patient was admitted neurology consulted-found to have small acute cerebellar ischemic infarct stroke work-up completed LDL at goal HbA1c 5.7, no atherosclerotic disease in in head and neck.  Echo with EF 65-70%, mild concentric LVH, G1 DD normal RV systolic function normal mitral valve aortic valve tricuspid, no regional wall motion abnormalities no atrial level shunt detected. Telemetry stable  she will continue with PT OT-did well, neurology advised cardiac monitor as outpatient and patient will continue on her antiplatelet regimen as prescribed by neurology.  Made her oncology aware that patient had stroke event after chemotherapy to see if chemotherapy needs to be modified.  [facility] is set up and is being discharged back to the facility On on further evaluation by PT she will need to skilled nursing facility, she will be discharged once bed available   Subjective: Seen this morning patient was not symptomatic and standing although orthostatic, given midodrine and 1000 cc bolus with improvement in tachycardia and orthostatic vitals negative, feels stable for discharge to facility today    Assessment and  Plan: Principal Problem:   Cerebellar stroke, acute ([place]) Active Problems:   Essential hypertension   Hyperlipidemia   PVC's (premature ventricular contractions)   Hyponatremia   GERD (gastroesophageal reflux disease)   Adenocarcinoma of left lung, stage 4 (HCC)   Acute stroke due to ischemia ([place])   Small acute left cerebellar ischemic infarct: Neurology following, continues to work-up LDL at goal HbA1c 5.7, no atherosclerotic disease in in head and neck.  Echo with EF 65-70%, mild concentric LVH, G1 DD normal RV systolic function normal mitral valve aortic valve tricuspid, no regional wall motion abnormalities no atrial level shunt detected. Telemetry stable  she will continue with PT OT-did well, neurology advised cardiac monitor as outpatient has been arranged.  Patient will continue with DAPT x3 weeks followed with aspirin alone.  Continue home statin.  Orthostatic hypotension symptomatic  Dehydration: Again give bolus fluid placed s/p 1 dose midodrine ( cont prn)> no more tachycardia or hypotensive  on standing-is stable for discharge back  Hypertension advised to hold antihypertensives,  Hyperkalemia-resolved Hypophosphatemia repleted Mildly elevated troponin likely demand ischemia flat- tte done-no acute finding Thrombocytopenia monitor as OP prediabetes HBA1c 5.6 diet education. Chronic hyponatremia-stable Hyperlipidemia LDL at goal, statin to be continued GERD continue PPI Abnormal chest x-ray Small to moderate left pleural effusion, suggestive of interstitial pneumonia, but afebrile with normal white counts with immunocompromise status stage IV lung cancer on chemotherapy Legionella pneumococcal antigen negative.  On empiric antibiotics  p.o. repeat chest x-ray 2 pa/lateral.  DVT prophylaxis: enoxaparin (LOVENOX) injection 30 mg Start: 02/26/22 2200 Code Status:   Code Status: DNR Family Communication: plan of care discussed with patient at bedside. Patient status is:  Inpatient because of stroke work-up Level of care: Telemetry  Dispo: [facility]            Anticipated disposition:snf today  Mobility Assessment (last 72 hours)     Mobility Assessment     Row Name 03/02/22 1000 03/02/22 0900 03/02/22 0735 03/01/22 2207 03/01/22 1032   Does patient have an order for bedrest or is patient medically unstable No - Continue assessment No - Continue assessment No - Continue assessment No - Continue assessment --   What is the highest level of mobility based on the progressive mobility assessment? Level 5 (Walks with assist in room/hall) - Balance while stepping forward/back and can walk in room with assist - Complete Level 5 (Walks with assist in room/hall) - Balance while stepping forward/back and can walk in room with assist - Complete Level 5 (Walks with assist in room/hall) - Balance while stepping forward/back and can walk in room with assist - Complete Level 5 (Walks with assist in room/hall) - Balance while stepping forward/back and can walk in room with assist - Complete Level 5 (Walks with assist in room/hall) - Balance while stepping forward/back and can walk in room with assist - Complete    Row Name 03/01/22 0730 02/28/22 2007 02/28/22 1110 02/28/22 0900 02/27/22 1901   Does patient have an order for bedrest or is patient medically unstable No - Continue assessment No - Continue assessment -- No - Continue assessment No - Continue assessment   What is the highest level of mobility based on the progressive mobility assessment? Level 5 (Walks with assist in room/hall) - Balance while stepping forward/back and can walk in room with assist - Complete Level 5 (Walks with assist in room/hall) - Balance while stepping forward/back and can walk in room with assist - Complete Level 5 (Walks with assist in room/hall) - Balance while stepping forward/back and can walk in room with assist - Complete -- Level 5 (Walks with  assist in room/hall) - Balance while stepping forward/back and can walk in room with assist - Complete    Row Name 02/27/22 1603 02/27/22 1601         Does patient have an order for bedrest or is patient medically unstable No - Continue assessment --      What is the highest level of mobility based on the progressive mobility assessment? Level 5 (Walks with assist in room/hall) - Balance while stepping forward/back and can walk in room with assist - Complete Level 5 (Walks with assist in room/hall) - Balance while stepping forward/back and can walk in room with assist - Complete                Objective: Vitals last 24 hrs: Vitals:   03/01/22 2105 03/02/22 0521 03/02/22 0900 03/02/22 1319  BP: (!) 149/85 127/81  (!) 141/74  Pulse: 95 95 96 83  Resp: 17 17 19    Temp: 97.8 F (36.6 C) 97.7 F (36.5 C) (!) 97.5 F (36.4 C) 98 F (36.7 C)  TempSrc: Oral Oral Oral Oral  SpO2: 96% 97% 98% 98%  Weight:      Height:       Weight change:   Physical Examination: General exam: AAox3, weak,older appearing HEENT:Oral mucosa moist, Ear/Nose WNL grossly, dentition normal. Respiratory system: bilaterally clear, no use of accessory muscle Cardiovascular system: S1 & S2 +, regular rate, JVD neg. Gastrointestinal system: Abdomen soft, NT,ND,BS+ Nervous System:Alert, awake, moving extremities and grossly nonfocal Extremities: LE ankle edema neg, lower extremities warm Skin:  No rashes,no icterus. MSK: Normal muscle bulk,tone, power   Medications reviewed:  Scheduled Meds:   stroke: early stages of recovery book   Does not apply Once   aspirin EC  81 mg Oral Daily   atorvastatin  20 mg Oral q1800   azithromycin  500 mg Oral QHS   clopidogrel  75 mg Oral Daily   enoxaparin (LOVENOX) injection  30 mg Subcutaneous W46K   folic acid  1 mg Oral Daily   midodrine  2.5 mg Oral TID WC   multivitamin with minerals  1 tablet Oral Daily   pantoprazole  40 mg Oral Daily   Continuous  Infusions:  cefTRIAXone (ROCEPHIN)  IV 2 g (03/01/22 1838)      Diet Order             Diet Heart Room service appropriate? Yes; Fluid consistency: Thin  Diet effective now                 No intake or output data in the 24 hours ending 03/02/22 1333  Net IO Since Admission: 2,421.21 mL [03/02/22 1333]  Wt Readings from Last 3 Encounters:  02/27/22 47.2 kg  03/01/22 47.2 kg  02/25/22 46.7 kg     Unresulted Labs (From admission, onward)    None      Data Reviewed: I have personally reviewed following labs and imaging studies CBC: Recent Labs  Lab 02/26/22 1351 02/27/22 0430  WBC 8.1 6.5  NEUTROABS 7.4 5.1  HGB 12.7 12.7  HCT 37.7 38.1  MCV 91.5 91.1  PLT 149* 599*   Basic Metabolic Panel: Recent Labs  Lab 02/26/22 1351 02/26/22 1722 02/27/22 0430  NA 130*  --  133*  K 5.3* 4.8 4.8  CL 103  --  108  CO2 22  --  20*  GLUCOSE 116*  --  94  BUN 41*  --  25*  CREATININE 1.05*  --  0.81  CALCIUM 9.8  --  9.3  MG  --   --  2.0  PHOS  --   --  2.2*   GFR: Estimated Creatinine Clearance: 36.5 mL/min (by C-G formula based on SCr of 0.81 mg/dL). Liver Function Tests: Recent Labs  Lab 02/26/22 1351 02/27/22 0430  AST 25 25  ALT 18 18  ALKPHOS 55 56  BILITOT 0.8 0.7  PROT 5.8* 5.5*  ALBUMIN 3.3* 3.0*   No results for input(s): "LIPASE", "AMYLASE" in the last 168 hours. No results for input(s): "AMMONIA" in the last 168 hours. Coagulation Profile: No results for input(s): "INR", "PROTIME" in the last 168 hours. BNP (last 3 results) No results for input(s): "PROBNP" in the last 8760 hours. HbA1C:   Antimicrobials: Anti-infectives (From admission, onward)    Start     Dose/Rate Route Frequency Ordered Stop   02/28/22 0000  amoxicillin-clavulanate (AUGMENTIN) 875-125 MG tablet        1 tablet Oral 2 times daily 02/28/22 1034 03/05/22 2359   02/27/22 2200  azithromycin (ZITHROMAX) tablet 500 mg        500 mg Oral Daily at bedtime 02/27/22 1116  03/03/22 2159   02/26/22 1900  cefTRIAXone (ROCEPHIN) 2 g in sodium chloride 0.9 % 100 mL IVPB        2 g 200 mL/hr over 30 Minutes Intravenous Every 24 hours 02/26/22 1722 03/03/22 1859   02/26/22 1900  azithromycin (ZITHROMAX) 500 mg in sodium chloride 0.9 % 250 mL IVPB  Status:  Discontinued  500 mg 250 mL/hr over 60 Minutes Intravenous Every 24 hours 02/26/22 1722 02/27/22 1116      Culture/Microbiology    Component Value Date/Time   SDES PLEURAL FLUID 05/04/2020 1409   SPECREQUEST LEFT LUNG 05/04/2020 1409   CULT  05/04/2020 1409    NO GROWTH Performed at [facility], [place] [facility] [address] [place], [place] [address]    REPTSTATUS 05/07/2020 FINAL 05/04/2020 1409    Other culture-see note  Radiology Studies: DG Chest 2 View  Result Date: 03/01/2022 CLINICAL DATA:  Pleural effusions.  History of lung cancer. EXAM: CHEST - 2 VIEW COMPARISON:  Chest radiograph dated 02/26/2022. FINDINGS: Bilateral pleural effusions, left greater than right relatively similar to prior radiograph. Left mid to lower lung field reticulonodular interstitial densities again noted. Left perihilar density there evaluated on the CT. Small linear lucency along the left upper mediastinum to the left of the trachea, likely artifactual. A small left pneumothorax is less likely. Attention on follow-up imaging recommended. The cardiac silhouette is within limits. Atherosclerotic calcification of the aorta. Degenerative changes of the spine. No acute osseous pathology. IMPRESSION: Similar appearance of the lungs with small bilateral pleural effusions. Electronically Signed   By: Anner Crete M.D.   On: 03/01/2022 19:09     LOS: 4 days   Antonieta Pert, MD [facility]  03/02/2022, 1:33 PM

## 2022-03-02 NOTE — TOC Transition Note (Addendum)
Transition of Care [facility]) - CM/SW Discharge Note   Patient Details  Name: Shelley Flores MRN: [id number] Date of Birth: [birth year]  [facility] CM/SW Contact:  Ross Ludwig, LCSW Phone Number: 03/02/2022, 2:52 PM   Clinical Narrative:     Patient to be d/c'ed today to [facility] unit.  Patient and family agreeable to plans will transport via ems RN to call report to [phone].  CSW notified patient's friend Kavin Leech [phone] that patient is discharging today.    Final next level of care: [place] Barriers to Discharge: Barriers Resolved   Patient Goals and CMS Choice Patient states their goals for this hospitalization and ongoing recovery are:: To return to [facility] for rehab CMS Medicare.gov Compare Post Acute Care list provided to:: Patient Choice offered to / list presented to : Patient  Discharge Placement PASRR number recieved: 02/28/22            Patient chooses bed at: [facility] Patient to be transferred to facility by: [facility] Name of family member notified: Patient's friend Kavin Leech, [phone].  Patient and family notified of of transfer: 03/02/22  Discharge Plan and Services   Discharge Planning Services: CM Consult                                 Social Determinants of Health (SDOH) Interventions     Readmission Risk Interventions     No data to display

## 2022-03-04 ENCOUNTER — Other Ambulatory Visit: Payer: Self-pay | Admitting: Physician Assistant

## 2022-03-04 ENCOUNTER — Non-Acute Institutional Stay (SKILLED_NURSING_FACILITY): Payer: Medicare PPO | Admitting: Nurse Practitioner

## 2022-03-04 ENCOUNTER — Telehealth: Payer: Self-pay | Admitting: Pharmacist

## 2022-03-04 ENCOUNTER — Other Ambulatory Visit (HOSPITAL_COMMUNITY): Payer: Self-pay

## 2022-03-04 DIAGNOSIS — E871 Hypo-osmolality and hyponatremia: Secondary | ICD-10-CM

## 2022-03-04 DIAGNOSIS — R000 Tachycardia, unspecified: Secondary | ICD-10-CM | POA: Diagnosis not present

## 2022-03-04 DIAGNOSIS — C3492 Malignant neoplasm of unspecified part of left bronchus or lung: Secondary | ICD-10-CM

## 2022-03-04 DIAGNOSIS — I10 Essential (primary) hypertension: Secondary | ICD-10-CM | POA: Diagnosis not present

## 2022-03-04 DIAGNOSIS — I639 Cerebral infarction, unspecified: Secondary | ICD-10-CM

## 2022-03-04 DIAGNOSIS — R0609 Other forms of dyspnea: Secondary | ICD-10-CM | POA: Diagnosis not present

## 2022-03-04 DIAGNOSIS — E782 Mixed hyperlipidemia: Secondary | ICD-10-CM | POA: Diagnosis not present

## 2022-03-04 DIAGNOSIS — I951 Orthostatic hypotension: Secondary | ICD-10-CM | POA: Diagnosis not present

## 2022-03-04 MED ORDER — OSIMERTINIB MESYLATE 40 MG PO TABS
40.0000 mg | ORAL_TABLET | Freq: Every day | ORAL | 2 refills | Status: DC
  Filled 2022-03-04 – 2022-03-05 (×2): qty 30, 30d supply, fill #0

## 2022-03-04 NOTE — Progress Notes (Deleted)
This encounter was created in error - please disregard.

## 2022-03-04 NOTE — Assessment & Plan Note (Signed)
started Midodrine.

## 2022-03-04 NOTE — Progress Notes (Signed)
Location:   [facility] Room Number: 74 Place of Service:  SNF (31) Provider: Lennie Odor Tysheem Accardo NP  [name], MD  Patient Care Team: [name], MD as PCP - General (Internal Medicine) Debara Pickett Nadean Corwin, MD as PCP - Cardiology (Cardiology) Valrie Hart, RN as Oncology Nurse Navigator (Oncology)  Extended Emergency Contact Information Primary Emergency Contact: Smithwick,Pat Home Phone: [phone] Mobile Phone: [phone] Relation: Friend Secondary Emergency Contact: Genice Rouge Address: [place], [place] [address] [place] Phone: [phone] Work Phone: [phone] Relation: Other  Code Status: DNR Goals of care: Advanced Directive information    03/01/2022    3:28 PM  Advanced Directives  Does Patient Have a Medical Advance Directive? Yes  Type of Paramedic of [place];Living will  Does patient want to make changes to medical advance directive? No - Patient declined  Copy of [place] in Chart? Yes - validated most recent copy scanned in chart (See row information)     Chief Complaint  Patient presents with   Acute Visit    Medication review following hospital stay.     HPI:  [name] seen today for an acute visit for medication review following hospital stay.   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    Hx of a large left pleural effusion, s/p thoracentesis x2, drained 1.5L, Cytology showed adenocarcinoma stage IV lung cancer, CT showed left upper lobe mass invades the mediastinum at the level of the aortic or pulmonayr window, concerning for lymphangitic spread of disease,  f/u pulmonology Dr. Valeta Harms,  oncology Dr. Julien Nordmann, taking palliative  chemotherapy/immunotherapy, on hold Tagrisso since her acute stroke onset following chemo.  CXR in this hospitalization showed small to moderate left pleural effusion. Decadron before and after chemo.              HTN, off Amlodipine, Metoprolol,              Hyperlipidemia, takes Atorvastatin, LDL 67 02/27/22             SOB/mild valvular heart disease, Echo EF 65-70%             Hypophosphatemia, 2.2 02/27/22  Hyponatremia, Na 133 02/27/22             Edema BLE, off Spironolactone.   Past Medical History:  Diagnosis Date   Bruit    Abdominal bruit - Abdominal aorta/renal duplex Doppler evaluation 12/07/03 -Mildly abnormal evaluation. 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 This is consistant w/median arcuate ligament compression syndrome. *Right & Left Kidney: Essentially equal in size, symmetrical in shape w/no significant abnormalities visualized. *Right & Left Renal Arteries: No significant abnormalities.   Cancer ([place])    Edema extremities    LE edema    H/O myocardial perfusion scan 02/20/00   To rule out ischemia - Negative adequate Bruce protocol exercise stress test with a deconditioned exercise response and normal static myocardial perfusion images with EF calculated by QGS of 77%. Represents a low risk study.   Hyperlipidemia    Hypertension    Osteoarthritis    Osteoporosis    Pleural effusion 04/2020   Valvular heart disease    Mild valvular  heart disease by Echo in 2010, all mild and symptomatic, including concentric LVH, MR, TR, and AI with pulmonary artery pressure of 32. EF was normal.   Past Surgical History:  Procedure Laterality Date   Abdominal aorta/Renal duplex Doppler Evaluation  12/07/03   For abdominal bruit. Mildly abnormal evaluation. (See bruit in medical history)   BREAST EXCISIONAL BIOPSY Left 1966   BREAST EXCISIONAL BIOPSY Left 1999   CATARACT EXTRACTION  2003   CHEST TUBE INSERTION N/A 07/04/2020   Procedure: REMOVAL PLEURAL DRAINAGE  CATHETER;  Surgeon: Garner Nash, DO;  Location: [facility];  Service: Pulmonary;  Laterality: N/A;   COLONOSCOPY  10/22/2004   DEXA Bone Scan  06/23/2013   IR PERC PLEURAL DRAIN W/INDWELL CATH W/IMG GUIDE  05/10/2020   IR THORACENTESIS ASP PLEURAL SPACE W/IMG GUIDE  05/04/2020    Allergies  Allergen Reactions   Irbesartan Nausea Only    Allergies as of 03/04/2022       Reactions   Irbesartan Nausea Only        Medication List        Accurate as of March 04, 2022 11:59 PM. If you have any questions, ask your nurse or doctor.          acetaminophen 325 MG tablet Commonly known as: TYLENOL Take 2 tablets (650 mg total) by mouth every 6 (six) hours as needed for mild pain (or Fever >/= 101).   amoxicillin-clavulanate 875-125 MG tablet Commonly known as: AUGMENTIN Take 1 tablet by mouth 2 (two) times daily for 5 days.   aspirin 81 MG tablet Take 81 mg by mouth daily.   atorvastatin 20 MG tablet Commonly known as: LIPITOR TAKE 1 TABLET BY MOUTH EVERYDAY AT BEDTIME What changed: See the new instructions.   bisacodyl 5 MG EC tablet Commonly known as: DULCOLAX Take 5 mg by mouth daily as needed for moderate constipation.   clopidogrel 75 MG tablet Commonly known as: PLAVIX Take 1 tablet (75 mg total) by mouth daily for 20 days.   dexamethasone 4 MG tablet Commonly known as: DECADRON Please take 1 tablet twice a day the day before, the day of, and the day after chemotherapy What changed:  how much to take how to take this when to take this additional instructions   folic acid 1 MG tablet Commonly known as: FOLVITE Take 1 tablet (1 mg total) by mouth daily.   iVIZIA Dry Eyes 0.5 % Soln Generic drug: Povidone (PF) Place 1 drop into both eyes in the morning.   magnesium hydroxide 400 MG/5ML suspension Commonly known as: MILK OF MAGNESIA Take 15 mLs by mouth daily as needed for mild constipation.   midodrine 2.5 MG tablet Commonly known as:  PROAMATINE Take 1 tablet (2.5 mg total) by mouth 2 (two) times daily as needed (if sbp <100 on standing.).   osimertinib mesylate 40 MG tablet Commonly known as: Tagrisso Take 1 tablet (40 mg total) by mouth daily. Started by: Tobe Sos Heilingoetter, PA-C   prochlorperazine 10 MG tablet Commonly known as: COMPAZINE Take 1 tablet (10 mg total) by mouth every 6 (six) hours as needed. What changed: reasons to take this        Review of Systems  Constitutional:  Negative for appetite change, fatigue and fever.       Persisted poor appetite and fatigue.   HENT:  Positive for hearing loss. Negative for congestion and trouble swallowing.   Eyes:  Negative for visual disturbance.  Respiratory:  Positive for shortness of breath. Negative for cough and wheezing.   Cardiovascular:  Positive for leg swelling. Negative for chest pain and palpitations.  Gastrointestinal:  Negative for abdominal pain and constipation.       Poor appetite, early satiety.   Genitourinary:  Negative for dysuria, hematuria and urgency.  Musculoskeletal:  Positive for gait problem.  Skin:  Negative for color change.  Neurological:  Negative for speech difficulty, weakness, light-headedness and headaches.  Psychiatric/Behavioral:  Negative for behavioral problems and sleep disturbance. The patient is not nervous/anxious.     Immunization History  Administered Date(s) Administered   Fluad Quad(high Dose 65+) 01/06/2019, 02/08/2020, 02/27/2022   Influenza Split 08/14/2010, 01/16/2012, 02/02/2013, 01/24/2015, 02/01/2016, 02/19/2017, 01/28/2018   Influenza, High Dose Seasonal PF 01/24/2015, 02/01/2016, 01/28/2018, 02/15/2021   Influenza-Unspecified 02/01/2016   MODERNA COVID-19 SARS-COV-2 PEDS BIVALENT BOOSTER 6Y-11Y 10/24/2021   Moderna SARS-COV2 Booster Vaccination 10/18/2020   Moderna Sars-Covid-2 Vaccination 06/14/2019, 03/27/2020, 03/27/2020   PFIZER(Purple Top)SARS-COV-2 Vaccination 01/31/2021    Pneumococcal Conjugate-13 08/11/2013, 09/03/2013   Pneumococcal Polysaccharide-23 05/28/2004   Td 07/04/2000   Tdap 07/04/2009, 10/18/2019   Unspecified SARS-COV-2 Vaccination 03/27/2020   Zoster, Live 07/02/2005   Pertinent  Health Maintenance Due  Topic Date Due   INFLUENZA VACCINE  Completed   DEXA SCAN  Completed      02/28/2022   11:13 PM 03/01/2022    7:30 AM 03/01/2022    3:28 PM 03/01/2022   10:09 PM 03/02/2022    7:35 AM  Fall Risk  Falls in the past year?   0    Was there an injury with Fall?   0    Fall Risk Category Calculator   0    Fall Risk Category   Low    Patient Fall Risk Level High fall risk High fall risk High fall risk High fall risk Moderate fall risk  Patient at Risk for Falls Due to   History of fall(s);Impaired balance/gait;Impaired mobility    Fall risk Follow up   Falls evaluation completed     Functional Status Survey:    Vitals:   03/05/22 1001  BP: 131/82  Pulse: 99  Resp: (!) 22  Temp: (!) 97 F (36.1 C)  SpO2: 97%   There is no height or weight on file to calculate BMI. Physical Exam Vitals and nursing note reviewed.  Constitutional:      Appearance: Normal appearance.  HENT:     Head: Normocephalic and atraumatic.     Mouth/Throat:     Mouth: Mucous membranes are moist.  Eyes:     Extraocular Movements: Extraocular movements intact.     Conjunctiva/sclera: Conjunctivae normal.     Pupils: Pupils are equal, round, and reactive to light.  Cardiovascular:     Rate and Rhythm: Regular rhythm. Tachycardia present.     Heart sounds: Murmur heard.  Pulmonary:     Effort: Pulmonary effort is normal.     Breath sounds: No wheezing, rhonchi or rales.     Comments: L pleurx Abdominal:     General: Bowel sounds are normal.     Palpations: Abdomen is soft.     Tenderness: There is no abdominal tenderness.  Musculoskeletal:     Cervical back: Normal range of motion and neck supple.     Right lower leg: Edema present.     Left  lower leg: Edema present.     Comments: Minimal edema BLE  Skin:    General: Skin is warm and dry.  Neurological:     General: No focal deficit present.     Mental Status: She is alert and oriented to person, place, and time. Mental status is at baseline.     Motor: Weakness present.     Gait: Gait abnormal.     Comments: Mild left face weakness.   Psychiatric:        Mood and Affect: Mood normal.        Behavior: Behavior normal.        Thought Content: Thought content normal.        Judgment: Judgment normal.     Labs reviewed: Recent Labs    02/20/22 1304 02/26/22 1351 02/26/22 1722 02/27/22 0430  NA 129* 130*  --  133*  K 4.9 5.3* 4.8 4.8  CL 99 103  --  108  CO2 20* 22  --  20*  GLUCOSE 168* 116*  --  94  BUN 41* 41*  --  25*  CREATININE 1.38* 1.05*  --  0.81  CALCIUM 10.9* 9.8  --  9.3  MG  --   --   --  2.0  PHOS  --   --   --  2.2*   Recent Labs    02/20/22 1304 02/26/22 1351 02/27/22 0430  AST 22 25 25   ALT 14 18 18   ALKPHOS 68 55 56  BILITOT 0.5 0.8 0.7  PROT 7.4 5.8* 5.5*  ALBUMIN 4.3 3.3* 3.0*   Recent Labs    02/20/22 1304 02/26/22 1351 02/27/22 0430  WBC 19.9* 8.1 6.5  NEUTROABS 19.2* 7.4 5.1  HGB 13.7 12.7 12.7  HCT 39.7 37.7 38.1  MCV 89.8 91.5 91.1  PLT 256 149* 132*   Lab Results  Component Value Date   TSH 0.995 02/20/2022   Lab Results  Component Value Date   HGBA1C 5.7 (H) 02/26/2022   Lab Results  Component Value Date   CHOL 143 02/27/2022   HDL 59 02/27/2022   LDLCALC 67 02/27/2022   TRIG 83 02/27/2022   CHOLHDL 2.4 02/27/2022    Significant Diagnostic Results in last 30 days:  DG Chest 2 View  Result Date: 03/01/2022 CLINICAL DATA:  Pleural effusions.  History of lung cancer. EXAM: CHEST - 2 VIEW COMPARISON:  Chest radiograph dated 02/26/2022. FINDINGS: Bilateral pleural effusions, left greater than right relatively similar to prior radiograph. Left mid to lower lung field reticulonodular interstitial densities  again noted. Left perihilar density there evaluated on the CT. Small linear lucency along the left upper mediastinum to the left of the trachea, likely artifactual. A small left pneumothorax is less likely. Attention on follow-up imaging recommended. The cardiac silhouette is within limits. Atherosclerotic calcification of the aorta. Degenerative changes of the spine. No acute osseous pathology. IMPRESSION: Similar appearance of the lungs with small bilateral pleural effusions. Electronically Signed   By: Anner Crete M.D.   On: 03/01/2022 19:09   VAS [place] LOWER EXTREMITY VENOUS (DVT)  Result Date: 02/27/2022  Lower Venous DVT Study Patient Name:  Shelley Flores  Date of Exam:   02/27/2022 Medical Rec #: [id number]           Accession #:    [id number] Date of Birth: 1932/05/14            Patient Gender: F Patient Age:   34 years Exam Location[facility]  [facility] Procedure:      VAS [place] LOWER EXTREMITY VENOUS (DVT) Referring Phys: MCNEILL KIRKPATRICK --------------------------------------------------------------------------------  Indications: Stroke, and "leg  stiffness".  Risk Factors: Chemotherapy. Comparison Study: Previous exam on 05/04/20 was negative for DVT Performing Technologist: Rogelia Rohrer RVT, RDMS  Examination Guidelines: A complete evaluation includes B-mode imaging, spectral Doppler, color Doppler, and power Doppler as needed of all accessible portions of each vessel. Bilateral testing is considered an integral part of a complete examination. Limited examinations for reoccurring indications may be performed as noted. The reflux portion of the exam is performed with the patient in reverse Trendelenburg.  +---------+---------------+---------+-----------+----------+--------------+ RIGHT    CompressibilityPhasicitySpontaneityPropertiesThrombus Aging +---------+---------------+---------+-----------+----------+--------------+ CFV      Full           Yes      Yes                                  +---------+---------------+---------+-----------+----------+--------------+ SFJ      Full                                                        +---------+---------------+---------+-----------+----------+--------------+ FV Prox  Full           Yes      Yes                                 +---------+---------------+---------+-----------+----------+--------------+ FV Mid   Full           Yes      Yes                                 +---------+---------------+---------+-----------+----------+--------------+ FV DistalFull           Yes      Yes                                 +---------+---------------+---------+-----------+----------+--------------+ PFV      Full                                                        +---------+---------------+---------+-----------+----------+--------------+ POP      Full           Yes      Yes                                 +---------+---------------+---------+-----------+----------+--------------+ PTV      Full                                                        +---------+---------------+---------+-----------+----------+--------------+ PERO     Full                                                        +---------+---------------+---------+-----------+----------+--------------+   +---------+---------------+---------+-----------+----------+--------------+  LEFT     CompressibilityPhasicitySpontaneityPropertiesThrombus Aging +---------+---------------+---------+-----------+----------+--------------+ CFV      Full           Yes      Yes                                 +---------+---------------+---------+-----------+----------+--------------+ SFJ      Full                                                        +---------+---------------+---------+-----------+----------+--------------+ FV Prox  Full           Yes      Yes                                  +---------+---------------+---------+-----------+----------+--------------+ FV Mid   Full           Yes      Yes                                 +---------+---------------+---------+-----------+----------+--------------+ FV DistalFull           Yes      Yes                                 +---------+---------------+---------+-----------+----------+--------------+ PFV      Full                                                        +---------+---------------+---------+-----------+----------+--------------+ POP      Full           Yes      Yes                                 +---------+---------------+---------+-----------+----------+--------------+ PTV      Full                                                        +---------+---------------+---------+-----------+----------+--------------+ PERO     Full                                                        +---------+---------------+---------+-----------+----------+--------------+     Summary: BILATERAL: - No evidence of deep vein thrombosis seen in the lower extremities, bilaterally. -No evidence of popliteal cyst, bilaterally.   *See table(s) above for measurements and observations. Electronically signed by Harold Barban MD on 02/27/2022 at 11:50:36 PM.    Final    ECHOCARDIOGRAM COMPLETE  Result Date: 02/27/2022    ECHOCARDIOGRAM REPORT  Patient Name:   GIANNAH ZAVADIL Date of Exam: 02/27/2022 Medical Rec #:  [id number]          Height:       64.0 in Accession #:    [id number]         Weight:       103.0 lb Date of Birth:  Oct 06, 1932           BSA:          1.476 m Patient Age:    68 years           BP:           148/90 mmHg Patient Gender: F                  HR:           83 bpm. Exam Location:  Inpatient Procedure: 2D Echo, Cardiac Doppler and Color Doppler Indications:    Stroke  History:        Patient has prior history of Echocardiogram examinations, most                 recent 05/04/2020. Risk  Factors:Hypertension, Dyslipidemia and                 Former Smoker. Lung cancer stg IV.  Sonographer:    Eartha Inch Referring Phys: [id number] DANIEL V THOMPSON  Sonographer Comments: Technically difficult study due to poor echo windows. Image acquisition challenging due to patient body habitus and Image acquisition challenging due to respiratory motion. IMPRESSIONS  1. Left ventricular ejection fraction, by estimation, is 65 to 70%. The left ventricle has normal function. The left ventricle has no regional wall motion abnormalities. There is mild concentric left ventricular hypertrophy. Left ventricular diastolic parameters are consistent with Grade I diastolic dysfunction (impaired relaxation).  2. Right ventricular systolic function is normal. The right ventricular size is normal. There is normal pulmonary artery systolic pressure.  3. Right atrial size was mildly dilated.  4. The mitral valve is normal in structure. No evidence of mitral valve regurgitation. No evidence of mitral stenosis.  5. The aortic valve is tricuspid. There is mild calcification of the aortic valve. Aortic valve regurgitation is not visualized. Aortic valve sclerosis/calcification is present, without any evidence of aortic stenosis.  6. The inferior vena cava is normal in size with greater than 50% respiratory variability, suggesting right atrial pressure of 3 mmHg. FINDINGS  Left Ventricle: Left ventricular ejection fraction, by estimation, is 65 to 70%. The left ventricle has normal function. The left ventricle has no regional wall motion abnormalities. The left ventricular internal cavity size was normal in size. There is  mild concentric left ventricular hypertrophy. Left ventricular diastolic parameters are consistent with Grade I diastolic dysfunction (impaired relaxation). Right Ventricle: The right ventricular size is normal. No increase in right ventricular wall thickness. Right ventricular systolic function is normal. There is  normal pulmonary artery systolic pressure. The tricuspid regurgitant velocity is 2.44 m/s, and  with an assumed right atrial pressure of 8 mmHg, the estimated right ventricular systolic pressure is 35.3 mmHg. Left Atrium: Left atrial size was normal in size. Right Atrium: Right atrial size was mildly dilated. Pericardium: There is no evidence of pericardial effusion. Mitral Valve: The mitral valve is normal in structure. Mild mitral annular calcification. No evidence of mitral valve regurgitation. No evidence of mitral valve stenosis. Tricuspid Valve: The tricuspid valve is normal in structure. Tricuspid valve regurgitation is mild . No  evidence of tricuspid stenosis. Aortic Valve: The aortic valve is tricuspid. There is mild calcification of the aortic valve. Aortic valve regurgitation is not visualized. Aortic valve sclerosis/calcification is present, without any evidence of aortic stenosis. Pulmonic Valve: The pulmonic valve was normal in structure. Pulmonic valve regurgitation is not visualized. No evidence of pulmonic stenosis. Aorta: The aortic root is normal in size and structure. Venous: The inferior vena cava is normal in size with greater than 50% respiratory variability, suggesting right atrial pressure of 3 mmHg. IAS/Shunts: No atrial level shunt detected by color flow Doppler.  LEFT VENTRICLE PLAX 2D LVIDd:         2.80 cm     Diastology LVIDs:         1.80 cm     LV e' medial:    5.02 cm/s LV PW:         1.00 cm     LV E/e' medial:  17.4 LV IVS:        1.00 cm     LV e' lateral:   3.30 cm/s LVOT diam:     1.80 cm     LV E/e' lateral: 26.5 LV SV:         82 LV SV Index:   56 LVOT Area:     2.54 cm  LV Volumes (MOD) LV vol d, MOD A2C: 44.7 ml LV vol d, MOD A4C: 37.5 ml LV vol s, MOD A2C: 10.6 ml LV vol s, MOD A4C: 8.8 ml LV SV MOD A2C:     34.1 ml LV SV MOD A4C:     37.5 ml LV SV MOD BP:      32.3 ml RIGHT VENTRICLE             IVC RV S prime:     14.90 cm/s  IVC diam: 2.10 cm TAPSE (M-mode): 2.5 cm  LEFT ATRIUM           Index        RIGHT ATRIUM           Index LA diam:      2.60 cm 1.76 cm/m   RA Area:     10.60 cm LA Vol (A2C): 44.5 ml 30.16 ml/m  RA Volume:   22.40 ml  15.18 ml/m LA Vol (A4C): 18.7 ml 12.67 ml/m  AORTIC VALVE LVOT Vmax:   137.00 cm/s LVOT Vmean:  100.000 cm/s LVOT VTI:    0.322 m  AORTA Ao Root diam: 2.70 cm Ao Asc diam:  2.90 cm MITRAL VALVE                TRICUSPID VALVE MV Area (PHT): 1.72 cm     TR Peak grad:   23.8 mmHg MV Decel Time: 440 msec     TR Mean grad:   16.0 mmHg MV E velocity: 87.30 cm/s   TR Vmax:        244.00 cm/s MV A velocity: 156.00 cm/s  TR Vmean:       197.0 cm/s MV E/A ratio:  0.56                             SHUNTS                             Systemic VTI:  0.32 m  Systemic Diam: 1.80 cm Shelley Bickers MD Electronically signed by Shelley Bickers MD Signature Date/Time: 02/27/2022/4:40:25 PM    Final    CT ANGIO HEAD NECK W WO CM  Result Date: 02/26/2022 CLINICAL DATA:  Weakness after receiving chemotherapy; small acute infarct in the left cerebellum on same-day MRI EXAM: CT ANGIOGRAPHY HEAD AND NECK TECHNIQUE: Multidetector CT imaging of the head and neck was performed using the standard protocol during bolus administration of intravenous contrast. Multiplanar CT image reconstructions and MIPs were obtained to evaluate the vascular anatomy. Carotid stenosis measurements (when applicable) are obtained utilizing NASCET criteria, using the distal internal carotid diameter as the denominator. RADIATION DOSE REDUCTION: This exam was performed according to the departmental dose-optimization program which includes automated exposure control, adjustment of the mA and/or kV according to patient size and/or use of iterative reconstruction technique. CONTRAST:  40mL OMNIPAQUE IOHEXOL 350 MG/ML SOLN COMPARISON:  No prior CT or CTA, correlation is made with 02/26/2022 MRI head and 01/28/2022 CT chest FINDINGS: CT HEAD FINDINGS Brain:  Linear hypodensity in the left cerebellum (series 14, image 7) correlates with the acute infarcts seen on the same-day MRI. No additional acute infarct. The known extra-axial mass along the right frontoparietal convexity is better seen on the same-day MRI (series 14, image 25). The known right posterior temporal lobe lesion is not appreciated on CT. No acute hemorrhage, mass effect, or midline shift. No hydrocephalus or extra-axial fluid collection. Vascular: No hyperdense vessel. Skull: Normal. Negative for fracture or focal lesion. Sinuses/Orbits: No acute finding. Status post bilateral lens replacements. Other: The mastoid air cells are well aerated. CTA NECK FINDINGS Aortic arch: Standard branching. Imaged portion shows no evidence of aneurysm or dissection. No significant stenosis of the major arch vessel origins. Right carotid system: No evidence of dissection, occlusion, or hemodynamically significant stenosis (greater than 50%). Left carotid system: No evidence of dissection, occlusion, or hemodynamically significant stenosis (greater than 50%). Vertebral arteries: No evidence of dissection, occlusion, or hemodynamically significant stenosis (greater than 50%). Skeleton: Redemonstrated sclerotic lesion in the left aspect T5, as seen on the 01/28/2022 study. A punctate lesion in the left aspect of T1 is also noted, unchanged. Hyperdense focus in the left lateral mass of C2 (series 8, image 180), bone island versus additional sclerotic lesion. Other neck: Negative. Upper chest: Scattered nodular opacities in the superior left lower lobe (series 6, image 1), which appears similar to 01/28/2022. No pleural effusion. Review of the MIP images confirms the above findings CTA HEAD FINDINGS Anterior circulation: Both internal carotid arteries are patent to the termini, without significant stenosis. A1 segments patent. Normal anterior communicating artery. Anterior cerebral arteries are patent to their distal aspects.  No M1 stenosis or occlusion. MCA branches perfused and symmetric. Posterior circulation: Vertebral arteries patent to the vertebrobasilar junction without stenosis. Posterior inferior cerebellar arteries patent proximally. Basilar patent to its distal aspect. Superior cerebellar arteries patent proximally. Patent P1 segments, diminutive on right. Near fetal origin of the right PCA from a prominent right posterior communicating artery. PCAs perfused to their distal aspects without stenosis. The left posterior communicating artery is not visualized. Venous sinuses: As permitted by contrast timing, patent. Anatomic variants: Near fetal origin of the right PCA. Review of the MIP images confirms the above findings IMPRESSION: 1. No intracranial large vessel occlusion or significant stenosis. 2. No hemodynamically significant stenosis in the neck. 3. Scattered nodular opacities in the superior left lower lobe, similar to 01/28/2022, which may be infectious or inflammatory. 4. Redemonstrated  sclerotic lesion in the left aspect of T5, as well as a punctate lesion in the left aspect of T1. Additional sclerotic focus in the left lateral mass of C2, concerning for an additional site of osseous metastatic disease. 5. Linear hypodensity in the left cerebellum correlates with the acute infarcts seen on the same-day MRI. No additional acute intracranial process. Electronically Signed   By: Merilyn Baba M.D.   On: 02/26/2022 18:48   MR Brain W and Wo Contrast  Result Date: 02/26/2022 CLINICAL DATA:  Brain/CNS neoplasm, assess treatment response EXAM: MRI HEAD WITHOUT AND WITH CONTRAST TECHNIQUE: Multiplanar, multiecho pulse sequences of the brain and surrounding structures were obtained without and with intravenous contrast. CONTRAST:  45mL GADAVIST GADOBUTROL 1 MMOL/ML IV SOLN COMPARISON:  MRI August 14, 2020. FINDINGS: Brain: Similar size of an extra-axial dural-based lesion along the right frontoparietal convexity, which  measures 1.4 cm on series 16, image 143. Similar versus slightly decreased conspicuity of a tiny lesion in the posterior temporal lobe (series 16, image 104) with faint susceptibility artifact (series 11, image 33). Otherwise, no abnormal enhancement. No new lesions identified. 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 Slight edema but no mass effect. No evidence of acute hemorrhage or midline shift. No hydrocephalus. Vascular: Major arterial flow voids are maintained at the skull base. Skull and upper cervical spine: Normal marrow signal. Sinuses/Orbits: Clear sinuses.  No acute orbital findings. Other: No mastoid effusions. IMPRESSION: 1. 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 Slight edema but no mass effect. 2. Similar versus slightly decreased conspicuity of a tiny treated lesion the posterior right temporal lobe. No progressive disease. 3. Unchanged extra-axial 1.4 cm lesion along the right chronic temporal convexity, probably a meningioma. Electronically Signed   By: Margaretha Sheffield M.D.   On: 02/26/2022 15:07   DG Chest Port 1 View  Result Date: 02/26/2022 CLINICAL DATA:  Generalized weakness EXAM: PORTABLE CHEST 1 VIEW COMPARISON:  06/30/2020 FINDINGS: Cardiac size is within normal limits. Low position of diaphragms suggests COPD. Small to moderate left pleural effusion is seen. There is interval removal of left chest tube. Increased interstitial markings are seen in left lower lung field. There is no focal consolidation. There is no significant right pleural effusion. There is no pneumothorax. IMPRESSION: Small to moderate left pleural effusion. Increased markings in left lower lung fields suggest possible interstitial pneumonia. Electronically Signed   By: Elmer Picker M.D.   On: 02/26/2022 14:14    Assessment/Plan: Cerebellar stroke, acute ([place]) she was found to have small acute infarct in the left cerebellum on MRI, DAPT x3 weeks, then ASA only, CBC/BMP one  week  Orthostatic hypotension  started Midodrine.   Adenocarcinoma of left lung, stage 4 (HCC) Hx of a large left pleural effusion, s/p thoracentesis x2, drained 1.5L, Cytology showed adenocarcinoma stage IV lung cancer, CT showed left upper lobe mass invades the mediastinum at the level of the aortic or pulmonayr window, concerning for lymphangitic spread of disease,  f/u pulmonology Dr. Valeta Harms,  oncology Dr. Julien Nordmann, taking palliative chemotherapy/immunotherapy, on hold Tagrisso since her acute stroke onset following chemo.  CXR in this hospitalization showed small to moderate left pleural effusion. Decadron before and after chemo.   Essential hypertension off Amlodipine, Metoprolol,   Hyperlipidemia  takes Atorvastatin, LDL 67 02/27/22  DOE (dyspnea on exertion)   SOB/mild valvular heart disease, Echo EF 65-70%  Hypophosphatemia 2.2 02/27/22  Hyponatremia Na 133 02/27/22  Tachycardia Asymptomatic, contributory factors: low Bp  vs discontinued Metoprolol, will schedule Midodrine 2.5mg  bid, have Metoprolol 12.5mg  bid prn for HR>100. F/u Cardiology.     Family/ staff Communication: plan of care reviewed with the patient and charge nurse.   Labs/tests ordered:  none  Time spend 35 minutes.

## 2022-03-04 NOTE — Telephone Encounter (Addendum)
Oral Chemotherapy Pharmacist Encounter   Called [facility] [phone]) regarding patient resuming Tagrisso while at SNF. Left voicemail for patient's nursing station [address] [facility] section) to call back to ensure they have specific documentation on their end to administer Tagrisso to patient.   Oral Oncology Clinic will continue to follow.   Leron Croak, PharmD, BCPS, [facility] Hematology/Oncology Clinical Pharmacist [facility] and [place] [phone] 03/04/2022 12:35 PM

## 2022-03-04 NOTE — Assessment & Plan Note (Signed)
2.2 02/27/22 

## 2022-03-04 NOTE — Assessment & Plan Note (Signed)
Hx of a large left pleural effusion, s/p thoracentesis x2, drained 1.5L, Cytology showed adenocarcinoma stage IV lung cancer, CT showed left upper lobe mass invades the mediastinum at the level of the aortic or pulmonayr window, concerning for lymphangitic spread of disease,  f/u pulmonology Dr. Valeta Harms,  oncology Dr. Julien Nordmann, taking palliative chemotherapy/immunotherapy, on hold Tagrisso since her acute stroke onset following chemo.  CXR in this hospitalization showed small to moderate left pleural effusion. Decadron before and after chemo.

## 2022-03-04 NOTE — Assessment & Plan Note (Signed)
SOB/mild valvular heart disease, Echo EF 65-70%

## 2022-03-04 NOTE — Assessment & Plan Note (Signed)
she was found to have small acute infarct in the left cerebellum on MRI, DAPT x3 weeks, then ASA only, CBC/BMP one week

## 2022-03-04 NOTE — Assessment & Plan Note (Signed)
Na 133 02/27/22

## 2022-03-04 NOTE — Assessment & Plan Note (Signed)
takes Atorvastatin, LDL 67 02/27/22

## 2022-03-04 NOTE — Assessment & Plan Note (Signed)
Asymptomatic, contributory factors: low Bp vs discontinued Metoprolol, will schedule Midodrine 2.5mg  bid, have Metoprolol 12.5mg  bid prn for HR>100. F/u Cardiology.

## 2022-03-04 NOTE — Assessment & Plan Note (Signed)
off Amlodipine, Metoprolol,

## 2022-03-05 ENCOUNTER — Non-Acute Institutional Stay (SKILLED_NURSING_FACILITY): Payer: Medicare PPO | Admitting: Family Medicine

## 2022-03-05 ENCOUNTER — Encounter: Payer: Self-pay | Admitting: Internal Medicine

## 2022-03-05 ENCOUNTER — Encounter: Payer: Self-pay | Admitting: Family Medicine

## 2022-03-05 ENCOUNTER — Encounter: Payer: Self-pay | Admitting: Nurse Practitioner

## 2022-03-05 ENCOUNTER — Other Ambulatory Visit: Payer: Self-pay | Admitting: Medical Oncology

## 2022-03-05 ENCOUNTER — Other Ambulatory Visit (HOSPITAL_COMMUNITY): Payer: Self-pay

## 2022-03-05 DIAGNOSIS — I10 Essential (primary) hypertension: Secondary | ICD-10-CM

## 2022-03-05 DIAGNOSIS — I639 Cerebral infarction, unspecified: Secondary | ICD-10-CM | POA: Diagnosis not present

## 2022-03-05 DIAGNOSIS — C3492 Malignant neoplasm of unspecified part of left bronchus or lung: Secondary | ICD-10-CM

## 2022-03-05 DIAGNOSIS — R000 Tachycardia, unspecified: Secondary | ICD-10-CM

## 2022-03-05 DIAGNOSIS — E782 Mixed hyperlipidemia: Secondary | ICD-10-CM

## 2022-03-05 MED ORDER — OSIMERTINIB MESYLATE 40 MG PO TABS: 40.0000 mg | ORAL_TABLET | Freq: Every day | ORAL | 2 refills | Status: DC

## 2022-03-05 MED ORDER — OSIMERTINIB MESYLATE 40 MG PO TABS
40.0000 mg | ORAL_TABLET | Freq: Every day | ORAL | 2 refills | Status: DC
  Filled 2022-03-05: qty 30, 30d supply, fill #0
  Filled 2022-03-26: qty 30, 30d supply, fill #1

## 2022-03-05 NOTE — Telephone Encounter (Signed)
Oral Chemotherapy Pharmacist Encounter   2nd attempt made to reach patient's nursing station at [facility] [phone]) to coordinate patient resuming [place]. Left voicemail at the [facility] for call back.   Leron Croak, PharmD, BCPS, [facility] Hematology/Oncology Clinical Pharmacist [facility] and [place] [phone] 03/05/2022 9:48 AM

## 2022-03-05 NOTE — Telephone Encounter (Signed)
Oral Chemotherapy Pharmacist Encounter   Received call back from [place], RN with [facility]. Confirmed for patient to resume [name] they will need a signed prescription faxed to their facility at [phone] at the Attn: Robin.  Confirmed mailing address for pharmacy to ship Tagrisso to facility for patient:  [place]: [address], [place] [address]  The above mailing address has been shared with the [facility]. [name] will be ordered and shipped out from the pharmacy on 10/25 for delivery to [facility] on 03/07/22.  Leron Croak, PharmD, BCPS, [facility] Hematology/Oncology Clinical Pharmacist [facility] and [place] [phone] 03/05/2022 12:02 PM

## 2022-03-05 NOTE — Progress Notes (Signed)
Provider:  Alain Honey, MD Location:      Place of Service:     PCP: [name], MD Patient Care Team: [name], MD as PCP - General (Internal Medicine) Debara Pickett Nadean Corwin, MD as PCP - Cardiology (Cardiology) Valrie Hart, RN as Oncology Nurse Navigator (Oncology)  Extended Emergency Contact Information Primary Emergency Contact: Smithwick,Pat Home Phone: [phone] Mobile Phone: [phone] Relation: Friend Secondary Emergency Contact: Genice Rouge Address: [place],  [address] [place] Phone: [phone] Work Phone: [phone] Relation: Other  Code Status:  Goals of Care: Advanced Directive information    03/01/2022    3:28 PM  Advanced Directives  Does Patient Have a Medical Advance Directive? Yes  Type of Paramedic of [place];Living will  Does patient want to make changes to medical advance directive? No - Patient declined  Copy of [place] in Chart? Yes - validated most recent copy scanned in chart (See row information)      No chief complaint on file.   HPI: Patient is a 88 y.o. female seen today for admission to [facility] for rehabilitation following hospitalization from February 26, 2022 to March 02, 2022 for acute cerebellar stroke. Patient was taken to the ER from friend's home with symptoms of generalized weakness gait abnormalities x6 days.  In the ER she was found to have small to moderate pleural effusion with history of same secondary to stage IV lung cancer.  MRI showed acute infarct in the left cerebellum with some slight edema but no mass effect.  Was seen in consultation by neurology.  Oncology was also consulted.  She was found to have orthostatic hypotension and given IV fluid boluses.  Prior to her hospitalization she had been on amlodipine as well as metoprolol per cardiology.  She had been seeing cardiology annually for  a flow murmur and for management of her blood pressure  Past Medical History:  Diagnosis Date   Bruit    Abdominal bruit - Abdominal aorta/renal duplex Doppler evaluation 12/07/03 -Mildly abnormal evaluation. 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 This is consistant w/median arcuate ligament compression syndrome. *Right & Left Kidney: Essentially equal in size, symmetrical in shape w/no significant abnormalities visualized. *Right & Left Renal Arteries: No significant abnormalities.   Cancer ([place])    Edema extremities    LE edema    H/O myocardial perfusion scan 02/20/00   To rule out ischemia - Negative adequate Bruce protocol exercise stress test with a deconditioned exercise response and normal static myocardial perfusion images with EF calculated by QGS of 77%. Represents a low risk study.   Hyperlipidemia    Hypertension    Osteoarthritis    Osteoporosis    Pleural effusion 04/2020   Valvular heart disease    Mild valvular heart disease by Echo in 2010, all mild and symptomatic, including concentric LVH, MR, TR, and AI with pulmonary artery pressure of 32. EF was normal.   Past Surgical History:  Procedure Laterality Date   Abdominal aorta/Renal duplex Doppler Evaluation  12/07/03   For abdominal bruit. Mildly abnormal evaluation. (See bruit in medical history)   BREAST EXCISIONAL BIOPSY Left 1966   BREAST EXCISIONAL BIOPSY Left 1999   CATARACT EXTRACTION  2003   CHEST TUBE INSERTION N/A 07/04/2020   Procedure: REMOVAL PLEURAL DRAINAGE CATHETER;  Surgeon: Garner Nash, DO;  Location: [facility];  Service:  Pulmonary;  Laterality: N/A;   COLONOSCOPY  10/22/2004   DEXA Bone Scan  06/23/2013   IR PERC PLEURAL DRAIN W/INDWELL CATH W/IMG GUIDE  05/10/2020   IR THORACENTESIS ASP PLEURAL SPACE W/IMG GUIDE  05/04/2020    reports that she quit smoking about 59 years ago. Her smoking use included cigarettes. She has a 1.50 pack-year smoking history. She has never used  smokeless tobacco. She reports current alcohol use of about 2.0 standard drinks of alcohol per week. She reports that she does not use drugs. Social History   Socioeconomic History   Marital status: Single    Spouse name: Not on file   Number of children: Not on file   Years of education: Not on file   Highest education level: Not on file  Occupational History   Occupation: Retired    Fish farm manager: [employer]    Comment: Chemistry professor  Tobacco Use   Smoking status: Former    Packs/day: 0.25    Years: 6.00    Total pack years: 1.50    Types: Cigarettes    Quit date: 05/13/1962    Years since quitting: 59.8   Smokeless tobacco: Never  Vaping Use   Vaping Use: Never used  Substance and Sexual Activity   Alcohol use: Yes    Alcohol/week: 2.0 standard drinks of alcohol    Types: 2 Standard drinks or equivalent per week    Comment: eine   Drug use: No   Sexual activity: Not on file  Other Topics Concern   Not on file  Social History Narrative   Not on file   Social Determinants of Health   Financial Resource Strain: Low Risk  (09/10/2021)   Overall Financial Resource Strain (CARDIA)    Difficulty of Paying Living Expenses: Not hard at all  Food Insecurity: No Food Insecurity (02/26/2022)   Hunger Vital Sign    Worried About Running Out of Food in the Last Year: Never true    Ran Out of Food in the Last Year: Never true  Transportation Needs: No Transportation Needs (02/26/2022)   PRAPARE - Hydrologist (Medical): No    Lack of Transportation (Non-Medical): No  Physical Activity: Sufficiently Active (09/10/2021)   Exercise Vital Sign    Days of Exercise per Week: 7 days    Minutes of Exercise per Session: 30 min  Stress: No Stress Concern Present (09/10/2021)   [place]    Feeling of Stress : Not at all  Social Connections: Socially Isolated (09/10/2021)   Social Connection  and Isolation Panel [NHANES]    Frequency of Communication with Friends and Family: Twice a week    Frequency of Social Gatherings with Friends and Family: Three times a week    Attends Religious Services: Never    Active Member of Clubs or Organizations: No    Attends Archivist Meetings: Never    Marital Status: Never married  Intimate Partner Violence: Not At Risk (02/26/2022)   Humiliation, Afraid, Rape, and Kick questionnaire    Fear of Current or Ex-Partner: No    Emotionally Abused: No    Physically Abused: No    Sexually Abused: No    Functional Status Survey:    Family History  Problem Relation Age of Onset   Leukemia Mother 34   Cancer Father        esophagial cancer   Heart failure Maternal Grandmother  Tuberculosis Maternal Grandfather    Heart disease Paternal Grandmother    Cancer Paternal Grandfather     Health Maintenance  Topic Date Due   Zoster Vaccines- Shingrix (1 of 2) Never done   COVID-19 Vaccine (5 - Mixed Product risk series) 12/19/2021   Medicare Annual Wellness (AWV)  10/11/2022   TETANUS/TDAP  10/17/2029   Pneumonia Vaccine 79+ Years old  Completed   INFLUENZA VACCINE  Completed   DEXA SCAN  Completed   HPV VACCINES  Aged Out    Allergies  Allergen Reactions   Irbesartan Nausea Only    Outpatient Encounter Medications as of 03/05/2022  Medication Sig   acetaminophen (TYLENOL) 325 MG tablet Take 2 tablets (650 mg total) by mouth every 6 (six) hours as needed for mild pain (or Fever >/= 101).   amoxicillin-clavulanate (AUGMENTIN) 875-125 MG tablet Take 1 tablet by mouth 2 (two) times daily for 5 days.   aspirin 81 MG tablet Take 81 mg by mouth daily.   atorvastatin (LIPITOR) 20 MG tablet TAKE 1 TABLET BY MOUTH EVERYDAY AT BEDTIME (Patient taking differently: Take 20 mg by mouth at bedtime.)   bisacodyl (DULCOLAX) 5 MG EC tablet Take 5 mg by mouth daily as needed for moderate constipation.   clopidogrel (PLAVIX) 75 MG tablet  Take 1 tablet (75 mg total) by mouth daily for 20 days.   dexamethasone (DECADRON) 4 MG tablet Please take 1 tablet twice a day the day before, the day of, and the day after chemotherapy (Patient taking differently: Take 4 mg by mouth See admin instructions. Take 4 mg by mouth two times a day the day before, day of, and day after chemotherapy)   folic acid (FOLVITE) 1 MG tablet Take 1 tablet (1 mg total) by mouth daily.   IVIZIA DRY EYES 0.5 % SOLN Place 1 drop into both eyes in the morning.   magnesium hydroxide (MILK OF MAGNESIA) 400 MG/5ML suspension Take 15 mLs by mouth daily as needed for mild constipation.   midodrine (PROAMATINE) 2.5 MG tablet Take 1 tablet (2.5 mg total) by mouth 2 (two) times daily as needed (if sbp <100 on standing.).   osimertinib mesylate (TAGRISSO) 40 MG tablet Take 1 tablet (40 mg total) by mouth daily.   prochlorperazine (COMPAZINE) 10 MG tablet Take 1 tablet (10 mg total) by mouth every 6 (six) hours as needed. (Patient taking differently: Take 10 mg by mouth every 6 (six) hours as needed for nausea or vomiting.)   No facility-administered encounter medications on file as of 03/05/2022.    Review of Systems  Constitutional: Negative.   HENT: Negative.    Eyes:  Positive for discharge.  Respiratory: Negative.    Cardiovascular: Negative.   Gastrointestinal: Negative.   Genitourinary: Negative.   Musculoskeletal:  Positive for gait problem.  Neurological:  Positive for dizziness and light-headedness.  Psychiatric/Behavioral: Negative.    All other systems reviewed and are negative.   There were no vitals filed for this visit. There is no height or weight on file to calculate BMI. Physical Exam Vitals and nursing note reviewed.  Constitutional:      Appearance: Normal appearance.  HENT:     Head: Normocephalic.     Mouth/Throat:     Mouth: Mucous membranes are moist.     Pharynx: Oropharynx is clear.  Eyes:     Extraocular Movements: Extraocular  movements intact.     Conjunctiva/sclera: Conjunctivae normal.     Pupils: Pupils are equal, round, and  reactive to light.  Cardiovascular:     Rate and Rhythm: Regular rhythm. Tachycardia present.     Heart sounds: Murmur heard.     Comments: Murmur is much more prominent upon standing and increased heart rate Pulmonary:     Comments: Decreased breath sounds on left consistent with effusion noted on chest x-ray Abdominal:     General: Abdomen is flat. Bowel sounds are normal.     Palpations: Abdomen is soft.  Musculoskeletal:     Cervical back: Normal range of motion.  Neurological:     General: No focal deficit present.     Mental Status: She is alert and oriented to person, place, and time.  Psychiatric:        Mood and Affect: Mood normal.        Behavior: Behavior normal.        Thought Content: Thought content normal.     Labs reviewed: Basic Metabolic Panel: Recent Labs    02/20/22 1304 02/26/22 1351 02/26/22 1722 02/27/22 0430  NA 129* 130*  --  133*  K 4.9 5.3* 4.8 4.8  CL 99 103  --  108  CO2 20* 22  --  20*  GLUCOSE 168* 116*  --  94  BUN 41* 41*  --  25*  CREATININE 1.38* 1.05*  --  0.81  CALCIUM 10.9* 9.8  --  9.3  MG  --   --   --  2.0  PHOS  --   --   --  2.2*   Liver Function Tests: Recent Labs    02/20/22 1304 02/26/22 1351 02/27/22 0430  AST 22 25 25   ALT 14 18 18   ALKPHOS 68 55 56  BILITOT 0.5 0.8 0.7  PROT 7.4 5.8* 5.5*  ALBUMIN 4.3 3.3* 3.0*   No results for input(s): "LIPASE", "AMYLASE" in the last 8760 hours. No results for input(s): "AMMONIA" in the last 8760 hours. CBC: Recent Labs    02/20/22 1304 02/26/22 1351 02/27/22 0430  WBC 19.9* 8.1 6.5  NEUTROABS 19.2* 7.4 5.1  HGB 13.7 12.7 12.7  HCT 39.7 37.7 38.1  MCV 89.8 91.5 91.1  PLT 256 149* 132*   Cardiac Enzymes: No results for input(s): "CKTOTAL", "CKMB", "CKMBINDEX", "TROPONINI" in the last 8760 hours. BNP: Invalid input(s): "POCBNP" Lab Results  Component Value  Date   HGBA1C 5.7 (H) 02/26/2022   Lab Results  Component Value Date   TSH 0.995 02/20/2022   No results found for: "VITAMINB12" No results found for: "FOLATE" No results found for: "IRON", "TIBC", "FERRITIN"  Imaging and Procedures obtained prior to SNF admission: VAS [place] LOWER EXTREMITY VENOUS (DVT)  Result Date: 02/27/2022  Lower Venous DVT Study Patient Name:  Shelley Flores  Date of Exam:   02/27/2022 Medical Rec #: [id number]           Accession #:    [id number] Date of Birth: Feb 20, 1933            Patient Gender: F Patient Age:   59 years Exam Location:  [facility] Procedure:      VAS [place] LOWER EXTREMITY VENOUS (DVT) Referring Phys: MCNEILL KIRKPATRICK --------------------------------------------------------------------------------  Indications: Stroke, and "leg stiffness".  Risk Factors: Chemotherapy. Comparison Study: Previous exam on 05/04/20 was negative for DVT Performing Technologist: Rogelia Rohrer RVT, RDMS  Examination Guidelines: A complete evaluation includes B-mode imaging, spectral Doppler, color Doppler, and power Doppler as needed of all accessible portions of each vessel. Bilateral testing is considered an integral  part of a complete examination. Limited examinations for reoccurring indications may be performed as noted. The reflux portion of the exam is performed with the patient in reverse Trendelenburg.  +---------+---------------+---------+-----------+----------+--------------+ RIGHT    CompressibilityPhasicitySpontaneityPropertiesThrombus Aging +---------+---------------+---------+-----------+----------+--------------+ CFV      Full           Yes      Yes                                 +---------+---------------+---------+-----------+----------+--------------+ SFJ      Full                                                        +---------+---------------+---------+-----------+----------+--------------+ FV Prox  Full           Yes      Yes                                  +---------+---------------+---------+-----------+----------+--------------+ FV Mid   Full           Yes      Yes                                 +---------+---------------+---------+-----------+----------+--------------+ FV DistalFull           Yes      Yes                                 +---------+---------------+---------+-----------+----------+--------------+ PFV      Full                                                        +---------+---------------+---------+-----------+----------+--------------+ POP      Full           Yes      Yes                                 +---------+---------------+---------+-----------+----------+--------------+ PTV      Full                                                        +---------+---------------+---------+-----------+----------+--------------+ PERO     Full                                                        +---------+---------------+---------+-----------+----------+--------------+   +---------+---------------+---------+-----------+----------+--------------+ LEFT     CompressibilityPhasicitySpontaneityPropertiesThrombus Aging +---------+---------------+---------+-----------+----------+--------------+ CFV      Full           Yes      Yes                                 +---------+---------------+---------+-----------+----------+--------------+  SFJ      Full                                                        +---------+---------------+---------+-----------+----------+--------------+ FV Prox  Full           Yes      Yes                                 +---------+---------------+---------+-----------+----------+--------------+ FV Mid   Full           Yes      Yes                                 +---------+---------------+---------+-----------+----------+--------------+ FV DistalFull           Yes      Yes                                  +---------+---------------+---------+-----------+----------+--------------+ PFV      Full                                                        +---------+---------------+---------+-----------+----------+--------------+ POP      Full           Yes      Yes                                 +---------+---------------+---------+-----------+----------+--------------+ PTV      Full                                                        +---------+---------------+---------+-----------+----------+--------------+ PERO     Full                                                        +---------+---------------+---------+-----------+----------+--------------+     Summary: BILATERAL: - No evidence of deep vein thrombosis seen in the lower extremities, bilaterally. -No evidence of popliteal cyst, bilaterally.   *See table(s) above for measurements and observations. Electronically signed by Harold Barban MD on 02/27/2022 at 11:50:36 PM.    Final    ECHOCARDIOGRAM COMPLETE  Result Date: 02/27/2022    ECHOCARDIOGRAM REPORT   Patient Name:   Shelley Flores Date of Exam: 02/27/2022 Medical Rec #:  [id number]          Height:       64.0 in Accession #:    [id number]         Weight:       103.0 lb Date of Birth:  07-28-1932  BSA:          1.476 m Patient Age:    76 years           BP:           148/90 mmHg Patient Gender: F                  HR:           83 bpm. Exam Location:  Inpatient Procedure: 2D Echo, Cardiac Doppler and Color Doppler Indications:    Stroke  History:        Patient has prior history of Echocardiogram examinations, most                 recent 05/04/2020. Risk Factors:Hypertension, Dyslipidemia and                 Former Smoker. Lung cancer stg IV.  Sonographer:    Eartha Inch Referring Phys: [id number] DANIEL V THOMPSON  Sonographer Comments: Technically difficult study due to poor echo windows. Image acquisition challenging due to patient body habitus and Image acquisition  challenging due to respiratory motion. IMPRESSIONS  1. Left ventricular ejection fraction, by estimation, is 65 to 70%. The left ventricle has normal function. The left ventricle has no regional wall motion abnormalities. There is mild concentric left ventricular hypertrophy. Left ventricular diastolic parameters are consistent with Grade I diastolic dysfunction (impaired relaxation).  2. Right ventricular systolic function is normal. The right ventricular size is normal. There is normal pulmonary artery systolic pressure.  3. Right atrial size was mildly dilated.  4. The mitral valve is normal in structure. No evidence of mitral valve regurgitation. No evidence of mitral stenosis.  5. The aortic valve is tricuspid. There is mild calcification of the aortic valve. Aortic valve regurgitation is not visualized. Aortic valve sclerosis/calcification is present, without any evidence of aortic stenosis.  6. The inferior vena cava is normal in size with greater than 50% respiratory variability, suggesting right atrial pressure of 3 mmHg. FINDINGS  Left Ventricle: Left ventricular ejection fraction, by estimation, is 65 to 70%. The left ventricle has normal function. The left ventricle has no regional wall motion abnormalities. The left ventricular internal cavity size was normal in size. There is  mild concentric left ventricular hypertrophy. Left ventricular diastolic parameters are consistent with Grade I diastolic dysfunction (impaired relaxation). Right Ventricle: The right ventricular size is normal. No increase in right ventricular wall thickness. Right ventricular systolic function is normal. There is normal pulmonary artery systolic pressure. The tricuspid regurgitant velocity is 2.44 m/s, and  with an assumed right atrial pressure of 8 mmHg, the estimated right ventricular systolic pressure is 98.3 mmHg. Left Atrium: Left atrial size was normal in size. Right Atrium: Right atrial size was mildly dilated.  Pericardium: There is no evidence of pericardial effusion. Mitral Valve: The mitral valve is normal in structure. Mild mitral annular calcification. No evidence of mitral valve regurgitation. No evidence of mitral valve stenosis. Tricuspid Valve: The tricuspid valve is normal in structure. Tricuspid valve regurgitation is mild . No evidence of tricuspid stenosis. Aortic Valve: The aortic valve is tricuspid. There is mild calcification of the aortic valve. Aortic valve regurgitation is not visualized. Aortic valve sclerosis/calcification is present, without any evidence of aortic stenosis. Pulmonic Valve: The pulmonic valve was normal in structure. Pulmonic valve regurgitation is not visualized. No evidence of pulmonic stenosis. Aorta: The aortic root is normal in size and structure. Venous: The inferior vena cava is  normal in size with greater than 50% respiratory variability, suggesting right atrial pressure of 3 mmHg. IAS/Shunts: No atrial level shunt detected by color flow Doppler.  LEFT VENTRICLE PLAX 2D LVIDd:         2.80 cm     Diastology LVIDs:         1.80 cm     LV e' medial:    5.02 cm/s LV PW:         1.00 cm     LV E/e' medial:  17.4 LV IVS:        1.00 cm     LV e' lateral:   3.30 cm/s LVOT diam:     1.80 cm     LV E/e' lateral: 26.5 LV SV:         82 LV SV Index:   56 LVOT Area:     2.54 cm  LV Volumes (MOD) LV vol d, MOD A2C: 44.7 ml LV vol d, MOD A4C: 37.5 ml LV vol s, MOD A2C: 10.6 ml LV vol s, MOD A4C: 8.8 ml LV SV MOD A2C:     34.1 ml LV SV MOD A4C:     37.5 ml LV SV MOD BP:      32.3 ml RIGHT VENTRICLE             IVC RV S prime:     14.90 cm/s  IVC diam: 2.10 cm TAPSE (M-mode): 2.5 cm LEFT ATRIUM           Index        RIGHT ATRIUM           Index LA diam:      2.60 cm 1.76 cm/m   RA Area:     10.60 cm LA Vol (A2C): 44.5 ml 30.16 ml/m  RA Volume:   22.40 ml  15.18 ml/m LA Vol (A4C): 18.7 ml 12.67 ml/m  AORTIC VALVE LVOT Vmax:   137.00 cm/s LVOT Vmean:  100.000 cm/s LVOT VTI:    0.322 m   AORTA Ao Root diam: 2.70 cm Ao Asc diam:  2.90 cm MITRAL VALVE                TRICUSPID VALVE MV Area (PHT): 1.72 cm     TR Peak grad:   23.8 mmHg MV Decel Time: 440 msec     TR Mean grad:   16.0 mmHg MV E velocity: 87.30 cm/s   TR Vmax:        244.00 cm/s MV A velocity: 156.00 cm/s  TR Vmean:       197.0 cm/s MV E/A ratio:  0.56                             SHUNTS                             Systemic VTI:  0.32 m                             Systemic Diam: 1.80 cm Glori Bickers MD Electronically signed by Glori Bickers MD Signature Date/Time: 02/27/2022/4:40:25 PM    Final    CT ANGIO HEAD NECK W WO CM  Result Date: 02/26/2022 CLINICAL DATA:  Weakness after receiving chemotherapy; small acute infarct in the left cerebellum on same-day MRI EXAM: CT ANGIOGRAPHY HEAD AND  NECK TECHNIQUE: Multidetector CT imaging of the head and neck was performed using the standard protocol during bolus administration of intravenous contrast. Multiplanar CT image reconstructions and MIPs were obtained to evaluate the vascular anatomy. Carotid stenosis measurements (when applicable) are obtained utilizing NASCET criteria, using the distal internal carotid diameter as the denominator. RADIATION DOSE REDUCTION: This exam was performed according to the departmental dose-optimization program which includes automated exposure control, adjustment of the mA and/or kV according to patient size and/or use of iterative reconstruction technique. CONTRAST:  69mL OMNIPAQUE IOHEXOL 350 MG/ML SOLN COMPARISON:  No prior CT or CTA, correlation is made with 02/26/2022 MRI head and 01/28/2022 CT chest FINDINGS: CT HEAD FINDINGS Brain: Linear hypodensity in the left cerebellum (series 14, image 7) correlates with the acute infarcts seen on the same-day MRI. No additional acute infarct. The known extra-axial mass along the right frontoparietal convexity is better seen on the same-day MRI (series 14, image 25). The known right posterior temporal  lobe lesion is not appreciated on CT. No acute hemorrhage, mass effect, or midline shift. No hydrocephalus or extra-axial fluid collection. Vascular: No hyperdense vessel. Skull: Normal. Negative for fracture or focal lesion. Sinuses/Orbits: No acute finding. Status post bilateral lens replacements. Other: The mastoid air cells are well aerated. CTA NECK FINDINGS Aortic arch: Standard branching. Imaged portion shows no evidence of aneurysm or dissection. No significant stenosis of the major arch vessel origins. Right carotid system: No evidence of dissection, occlusion, or hemodynamically significant stenosis (greater than 50%). Left carotid system: No evidence of dissection, occlusion, or hemodynamically significant stenosis (greater than 50%). Vertebral arteries: No evidence of dissection, occlusion, or hemodynamically significant stenosis (greater than 50%). Skeleton: Redemonstrated sclerotic lesion in the left aspect T5, as seen on the 01/28/2022 study. A punctate lesion in the left aspect of T1 is also noted, unchanged. Hyperdense focus in the left lateral mass of C2 (series 8, image 180), bone island versus additional sclerotic lesion. Other neck: Negative. Upper chest: Scattered nodular opacities in the superior left lower lobe (series 6, image 1), which appears similar to 01/28/2022. No pleural effusion. Review of the MIP images confirms the above findings CTA HEAD FINDINGS Anterior circulation: Both internal carotid arteries are patent to the termini, without significant stenosis. A1 segments patent. Normal anterior communicating artery. Anterior cerebral arteries are patent to their distal aspects. No M1 stenosis or occlusion. MCA branches perfused and symmetric. Posterior circulation: Vertebral arteries patent to the vertebrobasilar junction without stenosis. Posterior inferior cerebellar arteries patent proximally. Basilar patent to its distal aspect. Superior cerebellar arteries patent proximally.  Patent P1 segments, diminutive on right. Near fetal origin of the right PCA from a prominent right posterior communicating artery. PCAs perfused to their distal aspects without stenosis. The left posterior communicating artery is not visualized. Venous sinuses: As permitted by contrast timing, patent. Anatomic variants: Near fetal origin of the right PCA. Review of the MIP images confirms the above findings IMPRESSION: 1. No intracranial large vessel occlusion or significant stenosis. 2. No hemodynamically significant stenosis in the neck. 3. Scattered nodular opacities in the superior left lower lobe, similar to 01/28/2022, which may be infectious or inflammatory. 4. Redemonstrated sclerotic lesion in the left aspect of T5, as well as a punctate lesion in the left aspect of T1. Additional sclerotic focus in the left lateral mass of C2, concerning for an additional site of osseous metastatic disease. 5. Linear hypodensity in the left cerebellum correlates with the acute infarcts seen on the same-day MRI. No  additional acute intracranial process. Electronically Signed   By: Merilyn Baba M.D.   On: 02/26/2022 18:48   MR Brain W and Wo Contrast  Result Date: 02/26/2022 CLINICAL DATA:  Brain/CNS neoplasm, assess treatment response EXAM: MRI HEAD WITHOUT AND WITH CONTRAST TECHNIQUE: Multiplanar, multiecho pulse sequences of the brain and surrounding structures were obtained without and with intravenous contrast. CONTRAST:  41mL GADAVIST GADOBUTROL 1 MMOL/ML IV SOLN COMPARISON:  MRI August 14, 2020. FINDINGS: Brain: Similar size of an extra-axial dural-based lesion along the right frontoparietal convexity, which measures 1.4 cm on series 16, image 143. Similar versus slightly decreased conspicuity of a tiny lesion in the posterior temporal lobe (series 16, image 104) with faint susceptibility artifact (series 11, image 33). Otherwise, no abnormal enhancement. No new lesions identified. Small acute infarct in the left  cerebellum (series 5, image 8). Slight edema but no mass effect. No evidence of acute hemorrhage or midline shift. No hydrocephalus. Vascular: Major arterial flow voids are maintained at the skull base. Skull and upper cervical spine: Normal marrow signal. Sinuses/Orbits: Clear sinuses.  No acute orbital findings. Other: No mastoid effusions. IMPRESSION: 1. 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 Slight edema but no mass effect. 2. Similar versus slightly decreased conspicuity of a tiny treated lesion the posterior right temporal lobe. No progressive disease. 3. Unchanged extra-axial 1.4 cm lesion along the right chronic temporal convexity, probably a meningioma. Electronically Signed   By: Margaretha Sheffield M.D.   On: 02/26/2022 15:07   DG Chest Port 1 View  Result Date: 02/26/2022 CLINICAL DATA:  Generalized weakness EXAM: PORTABLE CHEST 1 VIEW COMPARISON:  06/30/2020 FINDINGS: Cardiac size is within normal limits. Low position of diaphragms suggests COPD. Small to moderate left pleural effusion is seen. There is interval removal of left chest tube. Increased interstitial markings are seen in left lower lung field. There is no focal consolidation. There is no significant right pleural effusion. There is no pneumothorax. IMPRESSION: Small to moderate left pleural effusion. Increased markings in left lower lung fields suggest possible interstitial pneumonia. Electronically Signed   By: Elmer Picker M.D.   On: 02/26/2022 14:14    Assessment/Plan 1. Acute stroke due to ischemia [facility]) Cerebellar stroke affects her balance and gait.  She will undergo physical therapy.  I observed her while physical therapy was doing preliminary evaluation.  I do believe she will benefit from some gait training  2. Adenocarcinoma of left lung, stage 4 (HCC) We will continue chemotherapy here while undergoing rehab per oncology  3. Cerebellar stroke, acute ([place]) We will continue Decadron and taper as per  hospitalist for the mild edema associated with stroke  4. Essential hypertension Blood pressures have been good off her medicine.  She has had some hypotension on midodrine was instituted.  We will restart low-dose metoprolol for tachycardia  5. Mixed hyperlipidemia She has been on atorvastatin for some years.  LDL is at goal  6. Tachycardia Unsure why she is tachycardic and less it is related to the fairly sudden stoppage of beta-blocker    Family/ staff Communication:   Labs/tests ordered:   Lillette Boxer. Sabra Heck, [place] [address] [place], [place] Office [phone]

## 2022-03-06 ENCOUNTER — Other Ambulatory Visit (HOSPITAL_COMMUNITY): Payer: Self-pay

## 2022-03-08 ENCOUNTER — Encounter: Payer: Self-pay | Admitting: Nurse Practitioner

## 2022-03-08 DIAGNOSIS — D638 Anemia in other chronic diseases classified elsewhere: Secondary | ICD-10-CM | POA: Insufficient documentation

## 2022-03-08 DIAGNOSIS — D696 Thrombocytopenia, unspecified: Secondary | ICD-10-CM | POA: Insufficient documentation

## 2022-03-12 NOTE — Progress Notes (Unsigned)
[place] OFFICE PROGRESS NOTE  [name], MD [place] [address]  DIAGNOSIS:  Stage IV (T2b, N2, M1c), non-small cell lung cancer, adenocarcinoma.  The patient presented with a left upper lobe mass, right supra clavicular, left hilar and subcarinal lymphadenopathy as well as diffuse left-sided pleural disease with associated malignant left pleural effusion and 2 small lymph nodes near the SMA in addition to multiple brain metastasis diagnosed in December 2021.    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  First dose on 02/20/22. Avastin discontinued starting from cycle #2 due to recent small CVA.  2) Tagrisso 80 mg p.o. daily. First dose June 10, 2020.  Status post 17.5 months of treatment.  Starting October 2022 the patient is treated with Tagrisso 40 mg p.o. daily because of the intolerance. The patient had progression in September 2023 but continues to take this due to hx of metastatic disease to the brain.   INTERVAL HISTORY: Shelley Flores 88 y.o. female returns to the clinic today for a follow-up visit.  The patient was last seen on 02/13/2022.  At that time, the patient was found to have evidence of disease progression.  Therefore, her treatment was changed to systemic chemotherapy with carboplatin, Alimta, and Avastin.  She underwent 1 cycle of treatment on 02/20/22.  She reports starting approximately 2 days after treatment, she was reporting lightheadedness. On 02/26/22 the patient presented to the emergency room with a chief complaint of difficulty ambulating, increased weakness, and dizziness the patient was found to have a small acute cerebellar stroke.   [facility] back at her [facility] living community.  She is undergoing PT  and OT. Since being discharged she is feeling fair.  She had a lower extremity doppler ultrasound while in the hospital which was negative for DVT. She does have some bilateral (R>L) ankle swelling; however, she has been elevating her lower extremities which has helped. She reports when she has soup at her senior living facility, that it is low sodium. PT recommended getting compression stockings. However, she is wondering how she would go about obtaining these. She denies any fever, chills, or night sweats.  She continues to have an up and down appetite, but she forces herself to eat.  Regarding her breathing, she denies significant shortness of breath. She sometimes has a cough if she talks for prolonged periods and when she eats. She has mild pleural effusion. Denies any headache or visual changes.  Denies bleeding or bruising.  She is here today for evaluation and consideration of starting cycle #2.   MEDICAL HISTORY: Past Medical History:  Diagnosis Date   Bruit    Abdominal bruit - Abdominal aorta/renal duplex Doppler evaluation 12/07/03 -Mildly abnormal evaluation. 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 This is consistant w/median arcuate ligament compression syndrome. *Right & Left Kidney: Essentially equal in size, symmetrical in shape w/no significant abnormalities visualized. *Right & Left Renal Arteries: No significant abnormalities.   Cancer ([facility])    Edema extremities    LE edema    H/O myocardial perfusion scan 02/20/00   To rule out ischemia - Negative adequate Bruce protocol exercise stress test with a deconditioned exercise response and normal static myocardial perfusion images with EF calculated by QGS  of 77%. Represents a low risk study.   Hyperlipidemia    Hypertension    Osteoarthritis    Osteoporosis    Pleural effusion 04/2020   Valvular heart disease    Mild valvular heart disease by Echo in 2010, all mild and symptomatic, including concentric LVH, MR, TR, and  AI with pulmonary artery pressure of 32. EF was normal.    ALLERGIES:  is allergic to irbesartan.  MEDICATIONS:  Current Outpatient Medications  Medication Sig Dispense Refill   acetaminophen (TYLENOL) 325 MG tablet Take 2 tablets (650 mg total) by mouth every 6 (six) hours as needed for mild pain (or Fever >/= 101). 20 tablet 0   aspirin 81 MG tablet Take 81 mg by mouth daily.     atorvastatin (LIPITOR) 20 MG tablet TAKE 1 TABLET BY MOUTH EVERYDAY AT BEDTIME (Patient taking differently: Take 20 mg by mouth at bedtime.) 90 tablet 3   bisacodyl (DULCOLAX) 5 MG EC tablet Take 5 mg by mouth daily as needed for moderate constipation.     clopidogrel (PLAVIX) 75 MG tablet Take 1 tablet (75 mg total) by mouth daily for 20 days. 20 tablet 0   dexamethasone (DECADRON) 4 MG tablet Please take 1 tablet twice a day the day before, the day of, and the day after chemotherapy (Patient taking differently: Take 4 mg by mouth See admin instructions. Take 4 mg by mouth two times a day the day before, day of, and day after chemotherapy) 40 tablet 1   folic acid (FOLVITE) 1 MG tablet Take 1 tablet (1 mg total) by mouth daily. 30 tablet 2   IVIZIA DRY EYES 0.5 % SOLN Place 1 drop into both eyes in the morning.     magnesium hydroxide (MILK OF MAGNESIA) 400 MG/5ML suspension Take 15 mLs by mouth daily as needed for mild constipation.     midodrine (PROAMATINE) 2.5 MG tablet Take 1 tablet (2.5 mg total) by mouth 2 (two) times daily as needed (if sbp <100 on standing.). 4 tablet 0   osimertinib mesylate (TAGRISSO) 40 MG tablet Take 1 tablet (40 mg total) by mouth daily. 30 tablet 2   prochlorperazine (COMPAZINE) 10 MG tablet Take 1 tablet (10 mg total) by mouth every 6 (six) hours as needed. (Patient taking differently: Take 10 mg by mouth every 6 (six) hours as needed for nausea or vomiting.) 30 tablet 2   No current facility-administered medications for this visit.    SURGICAL HISTORY:  Past Surgical History:   Procedure Laterality Date   Abdominal aorta/Renal duplex Doppler Evaluation  12/07/03   For abdominal bruit. Mildly abnormal evaluation. (See bruit in medical history)   BREAST EXCISIONAL BIOPSY Left 1966   BREAST EXCISIONAL BIOPSY Left 1999   CATARACT EXTRACTION  2003   CHEST TUBE INSERTION N/A 07/04/2020   Procedure: REMOVAL PLEURAL DRAINAGE CATHETER;  Surgeon: Garner Nash, DO;  Location: [facility];  Service: Pulmonary;  Laterality: N/A;   COLONOSCOPY  10/22/2004   DEXA Bone Scan  06/23/2013   IR PERC PLEURAL DRAIN W/INDWELL CATH W/IMG GUIDE  05/10/2020   IR THORACENTESIS ASP PLEURAL SPACE W/IMG GUIDE  05/04/2020    REVIEW OF SYSTEMS:   Constitutional: Positive for fatigue and decreased appetite. Negative for chills and fever.  HENT: Negative for mouth sores, nosebleeds, sore throat and trouble swallowing.   Eyes: Negative for eye problems and icterus.  Respiratory: Positive for stable dry cough. Negative for hemoptysis, shortness of breath and wheezing.   Cardiovascular:  Negative for chest pain. Positive for bilateral ankle  swelling R>L. No erythema.  Gastrointestinal: Positive for abdominal bloating. Positive for constipation Negative for diarrhea and vomiting.  Genitourinary: Negative for bladder incontinence, difficulty urinating, dysuria, frequency and hematuria.   Musculoskeletal: Negative for back pain, gait problem, neck pain and neck stiffness.  Skin: Negative for itching and rash.  Neurological: Positive for occasional lightheadedness. Negative for dizziness, extremity weakness, gait problem, headaches,  and seizures.  Hematological: Negative for adenopathy. Does not bruise/bleed easily.  Psychiatric/Behavioral: Negative for confusion, depression and sleep disturbance. The patient is not nervous/anxious.       PHYSICAL EXAMINATION:  Blood pressure (!) 150/72, pulse 78, temperature 97.8 F (36.6 C), temperature source Temporal, resp. rate 16, height _0  (1.626 m),  weight 104 lb 11.2 oz (47.5 kg), SpO2 99 %.  ECOG PERFORMANCE STATUS: 1  Physical Exam  Constitutional: Oriented to person, place, and time and thin appearing female, and in no distress.  HENT:  Head: Normocephalic and atraumatic.  Mouth/Throat: Oropharynx is clear and moist. No oropharyngeal exudate.  Eyes: Conjunctivae are normal. Right eye exhibits no discharge. Left eye exhibits no discharge. No scleral icterus.  Neck: Normal range of motion. Neck supple.  Cardiovascular: Normal rate, regular rhythm, normal heart sounds and intact distal pulses.   Pulmonary/Chest: Effort normal and breath sounds normal. No respiratory distress. No wheezes. No rales.  Abdominal: Soft. Bowel sounds are normal. Exhibits no distension and no mass. There is no tenderness.  Musculoskeletal: Normal range of motion.  Positive for bilateral ankle swelling right greater than left. Lymphadenopathy:    No cervical adenopathy.  Neurological: Alert and oriented to person, place, and time. Exhibits muscle wasting. Ambulates with a walker. Skin: Skin is warm and dry. No rash noted. Not diaphoretic. No erythema. No pallor.  Psychiatric: Mood, memory and judgment normal.  Vitals reviewed.    LABORATORY DATA: Lab Results  Component Value Date   WBC 7.2 03/13/2022   HGB 11.8 (L) 03/13/2022   HCT 33.9 (L) 03/13/2022   MCV 90.2 03/13/2022   PLT 450 (H) 03/13/2022      Chemistry      Component Value Date/Time   NA 134 (L) 03/13/2022 1102   NA 137 05/25/2020 0000   K 4.2 03/13/2022 1102   CL 103 03/13/2022 1102   CO2 25 03/13/2022 1102   BUN 16 03/13/2022 1102   BUN 12 05/25/2020 0000   CREATININE 0.90 03/13/2022 1102   CREATININE 1.22 (H) 05/21/2021 0800   GLU 83 05/25/2020 0000      Component Value Date/Time   CALCIUM 10.3 03/13/2022 1102   ALKPHOS 88 03/13/2022 1102   AST 20 03/13/2022 1102   ALT 29 03/13/2022 1102   BILITOT 0.3 03/13/2022 1102       RADIOGRAPHIC STUDIES:  DG Chest 2  View  Result Date: 03/01/2022 CLINICAL DATA:  Pleural effusions.  History of lung cancer. EXAM: CHEST - 2 VIEW COMPARISON:  Chest radiograph dated 02/26/2022. FINDINGS: Bilateral pleural effusions, left greater than right relatively similar to prior radiograph. Left mid to lower lung field reticulonodular interstitial densities again noted. Left perihilar density there evaluated on the CT. Small linear lucency along the left upper mediastinum to the left of the trachea, likely artifactual. A small left pneumothorax is less likely. Attention on follow-up imaging recommended. The cardiac silhouette is within limits. Atherosclerotic calcification of the aorta. Degenerative changes of the spine. No acute osseous pathology. IMPRESSION: Similar appearance of the lungs with small  bilateral pleural effusions. Electronically Signed   By: Anner Crete M.D.   On: 03/01/2022 19:09   VAS [place] LOWER EXTREMITY VENOUS (DVT)  Result Date: 02/27/2022  Lower Venous DVT Study Patient Name:  HAMPTON COST  Date of Exam:   02/27/2022 Medical Rec #: [id number]           Accession #:    [id number] Date of Birth: 01-Nov-1932            Patient Gender: F Patient Age:   67 years Exam Location:  [facility] Procedure:      VAS [place] LOWER EXTREMITY VENOUS (DVT) Referring Phys: MCNEILL KIRKPATRICK --------------------------------------------------------------------------------  Indications: Stroke, and "leg stiffness".  Risk Factors: Chemotherapy. Comparison Study: Previous exam on 05/04/20 was negative for DVT Performing Technologist: Rogelia Rohrer RVT, RDMS  Examination Guidelines: A complete evaluation includes B-mode imaging, spectral Doppler, color Doppler, and power Doppler as needed of all accessible portions of each vessel. Bilateral testing is considered an integral part of a complete examination. Limited examinations for reoccurring indications may be performed as noted. The reflux portion of the exam is performed with  the patient in reverse Trendelenburg.  +---------+---------------+---------+-----------+----------+--------------+ RIGHT    CompressibilityPhasicitySpontaneityPropertiesThrombus Aging +---------+---------------+---------+-----------+----------+--------------+ CFV      Full           Yes      Yes                                 +---------+---------------+---------+-----------+----------+--------------+ SFJ      Full                                                        +---------+---------------+---------+-----------+----------+--------------+ FV Prox  Full           Yes      Yes                                 +---------+---------------+---------+-----------+----------+--------------+ FV Mid   Full           Yes      Yes                                 +---------+---------------+---------+-----------+----------+--------------+ FV DistalFull           Yes      Yes                                 +---------+---------------+---------+-----------+----------+--------------+ PFV      Full                                                        +---------+---------------+---------+-----------+----------+--------------+ POP      Full           Yes      Yes                                 +---------+---------------+---------+-----------+----------+--------------+  PTV      Full                                                        +---------+---------------+---------+-----------+----------+--------------+ PERO     Full                                                        +---------+---------------+---------+-----------+----------+--------------+   +---------+---------------+---------+-----------+----------+--------------+ LEFT     CompressibilityPhasicitySpontaneityPropertiesThrombus Aging +---------+---------------+---------+-----------+----------+--------------+ CFV      Full           Yes      Yes                                  +---------+---------------+---------+-----------+----------+--------------+ SFJ      Full                                                        +---------+---------------+---------+-----------+----------+--------------+ FV Prox  Full           Yes      Yes                                 +---------+---------------+---------+-----------+----------+--------------+ FV Mid   Full           Yes      Yes                                 +---------+---------------+---------+-----------+----------+--------------+ FV DistalFull           Yes      Yes                                 +---------+---------------+---------+-----------+----------+--------------+ PFV      Full                                                        +---------+---------------+---------+-----------+----------+--------------+ POP      Full           Yes      Yes                                 +---------+---------------+---------+-----------+----------+--------------+ PTV      Full                                                        +---------+---------------+---------+-----------+----------+--------------+  PERO     Full                                                        +---------+---------------+---------+-----------+----------+--------------+     Summary: BILATERAL: - No evidence of deep vein thrombosis seen in the lower extremities, bilaterally. -No evidence of popliteal cyst, bilaterally.   *See table(s) above for measurements and observations. Electronically signed by Harold Barban MD on 02/27/2022 at 11:50:36 PM.    Final    ECHOCARDIOGRAM COMPLETE  Result Date: 02/27/2022    ECHOCARDIOGRAM REPORT   Patient Name:   KATIANNA MCCLENNEY Date of Exam: 02/27/2022 Medical Rec #:  [id number]          Height:       64.0 in Accession #:    [id number]         Weight:       103.0 lb Date of Birth:  December 23, 1932           BSA:          1.476 m Patient Age:    16 years           BP:            148/90 mmHg Patient Gender: F                  HR:           83 bpm. Exam Location:  Inpatient Procedure: 2D Echo, Cardiac Doppler and Color Doppler Indications:    Stroke  History:        Patient has prior history of Echocardiogram examinations, most                 recent 05/04/2020. Risk Factors:Hypertension, Dyslipidemia and                 Former Smoker. Lung cancer stg IV.  Sonographer:    Eartha Inch Referring Phys: [id number] DANIEL V THOMPSON  Sonographer Comments: Technically difficult study due to poor echo windows. Image acquisition challenging due to patient body habitus and Image acquisition challenging due to respiratory motion. IMPRESSIONS  1. Left ventricular ejection fraction, by estimation, is 65 to 70%. The left ventricle has normal function. The left ventricle has no regional wall motion abnormalities. There is mild concentric left ventricular hypertrophy. Left ventricular diastolic parameters are consistent with Grade I diastolic dysfunction (impaired relaxation).  2. Right ventricular systolic function is normal. The right ventricular size is normal. There is normal pulmonary artery systolic pressure.  3. Right atrial size was mildly dilated.  4. The mitral valve is normal in structure. No evidence of mitral valve regurgitation. No evidence of mitral stenosis.  5. The aortic valve is tricuspid. There is mild calcification of the aortic valve. Aortic valve regurgitation is not visualized. Aortic valve sclerosis/calcification is present, without any evidence of aortic stenosis.  6. The inferior vena cava is normal in size with greater than 50% respiratory variability, suggesting right atrial pressure of 3 mmHg. FINDINGS  Left Ventricle: Left ventricular ejection fraction, by estimation, is 65 to 70%. The left ventricle has normal function. The left ventricle has no regional wall motion abnormalities. The left ventricular internal cavity size was normal in size. There is  mild concentric left  ventricular hypertrophy. Left ventricular diastolic parameters are consistent with  Grade I diastolic dysfunction (impaired relaxation). Right Ventricle: The right ventricular size is normal. No increase in right ventricular wall thickness. Right ventricular systolic function is normal. There is normal pulmonary artery systolic pressure. The tricuspid regurgitant velocity is 2.44 m/s, and  with an assumed right atrial pressure of 8 mmHg, the estimated right ventricular systolic pressure is 35.5 mmHg. Left Atrium: Left atrial size was normal in size. Right Atrium: Right atrial size was mildly dilated. Pericardium: There is no evidence of pericardial effusion. Mitral Valve: The mitral valve is normal in structure. Mild mitral annular calcification. No evidence of mitral valve regurgitation. No evidence of mitral valve stenosis. Tricuspid Valve: The tricuspid valve is normal in structure. Tricuspid valve regurgitation is mild . No evidence of tricuspid stenosis. Aortic Valve: The aortic valve is tricuspid. There is mild calcification of the aortic valve. Aortic valve regurgitation is not visualized. Aortic valve sclerosis/calcification is present, without any evidence of aortic stenosis. Pulmonic Valve: The pulmonic valve was normal in structure. Pulmonic valve regurgitation is not visualized. No evidence of pulmonic stenosis. Aorta: The aortic root is normal in size and structure. Venous: The inferior vena cava is normal in size with greater than 50% respiratory variability, suggesting right atrial pressure of 3 mmHg. IAS/Shunts: No atrial level shunt detected by color flow Doppler.  LEFT VENTRICLE PLAX 2D LVIDd:         2.80 cm     Diastology LVIDs:         1.80 cm     LV e' medial:    5.02 cm/s LV PW:         1.00 cm     LV E/e' medial:  17.4 LV IVS:        1.00 cm     LV e' lateral:   3.30 cm/s LVOT diam:     1.80 cm     LV E/e' lateral: 26.5 LV SV:         82 LV SV Index:   56 LVOT Area:     2.54 cm  LV Volumes  (MOD) LV vol d, MOD A2C: 44.7 ml LV vol d, MOD A4C: 37.5 ml LV vol s, MOD A2C: 10.6 ml LV vol s, MOD A4C: 8.8 ml LV SV MOD A2C:     34.1 ml LV SV MOD A4C:     37.5 ml LV SV MOD BP:      32.3 ml RIGHT VENTRICLE             IVC RV S prime:     14.90 cm/s  IVC diam: 2.10 cm TAPSE (M-mode): 2.5 cm LEFT ATRIUM           Index        RIGHT ATRIUM           Index LA diam:      2.60 cm 1.76 cm/m   RA Area:     10.60 cm LA Vol (A2C): 44.5 ml 30.16 ml/m  RA Volume:   22.40 ml  15.18 ml/m LA Vol (A4C): 18.7 ml 12.67 ml/m  AORTIC VALVE LVOT Vmax:   137.00 cm/s LVOT Vmean:  100.000 cm/s LVOT VTI:    0.322 m  AORTA Ao Root diam: 2.70 cm Ao Asc diam:  2.90 cm MITRAL VALVE                TRICUSPID VALVE MV Area (PHT): 1.72 cm     TR Peak grad:   23.8 mmHg MV Decel Time:  440 msec     TR Mean grad:   16.0 mmHg MV E velocity: 87.30 cm/s   TR Vmax:        244.00 cm/s MV A velocity: 156.00 cm/s  TR Vmean:       197.0 cm/s MV E/A ratio:  0.56                             SHUNTS                             Systemic VTI:  0.32 m                             Systemic Diam: 1.80 cm Glori Bickers MD Electronically signed by Glori Bickers MD Signature Date/Time: 02/27/2022/4:40:25 PM    Final    CT ANGIO HEAD NECK W WO CM  Result Date: 02/26/2022 CLINICAL DATA:  Weakness after receiving chemotherapy; small acute infarct in the left cerebellum on same-day MRI EXAM: CT ANGIOGRAPHY HEAD AND NECK TECHNIQUE: Multidetector CT imaging of the head and neck was performed using the standard protocol during bolus administration of intravenous contrast. Multiplanar CT image reconstructions and MIPs were obtained to evaluate the vascular anatomy. Carotid stenosis measurements (when applicable) are obtained utilizing NASCET criteria, using the distal internal carotid diameter as the denominator. RADIATION DOSE REDUCTION: This exam was performed according to the departmental dose-optimization program which includes automated exposure control,  adjustment of the mA and/or kV according to patient size and/or use of iterative reconstruction technique. CONTRAST:  40m OMNIPAQUE IOHEXOL 350 MG/ML SOLN COMPARISON:  No prior CT or CTA, correlation is made with 02/26/2022 MRI head and 01/28/2022 CT chest FINDINGS: CT HEAD FINDINGS Brain: Linear hypodensity in the left cerebellum (series 14, image 7) correlates with the acute infarcts seen on the same-day MRI. No additional acute infarct. The known extra-axial mass along the right frontoparietal convexity is better seen on the same-day MRI (series 14, image 25). The known right posterior temporal lobe lesion is not appreciated on CT. No acute hemorrhage, mass effect, or midline shift. No hydrocephalus or extra-axial fluid collection. Vascular: No hyperdense vessel. Skull: Normal. Negative for fracture or focal lesion. Sinuses/Orbits: No acute finding. Status post bilateral lens replacements. Other: The mastoid air cells are well aerated. CTA NECK FINDINGS Aortic arch: Standard branching. Imaged portion shows no evidence of aneurysm or dissection. No significant stenosis of the major arch vessel origins. Right carotid system: No evidence of dissection, occlusion, or hemodynamically significant stenosis (greater than 50%). Left carotid system: No evidence of dissection, occlusion, or hemodynamically significant stenosis (greater than 50%). Vertebral arteries: No evidence of dissection, occlusion, or hemodynamically significant stenosis (greater than 50%). Skeleton: Redemonstrated sclerotic lesion in the left aspect T5, as seen on the 01/28/2022 study. A punctate lesion in the left aspect of T1 is also noted, unchanged. Hyperdense focus in the left lateral mass of C2 (series 8, image 180), bone island versus additional sclerotic lesion. Other neck: Negative. Upper chest: Scattered nodular opacities in the superior left lower lobe (series 6, image 1), which appears similar to 01/28/2022. No pleural effusion. Review of  the MIP images confirms the above findings CTA HEAD FINDINGS Anterior circulation: Both internal carotid arteries are patent to the termini, without significant stenosis. A1 segments patent. Normal anterior communicating artery. Anterior cerebral arteries are patent to  their distal aspects. No M1 stenosis or occlusion. MCA branches perfused and symmetric. Posterior circulation: Vertebral arteries patent to the vertebrobasilar junction without stenosis. Posterior inferior cerebellar arteries patent proximally. Basilar patent to its distal aspect. Superior cerebellar arteries patent proximally. Patent P1 segments, diminutive on right. Near fetal origin of the right PCA from a prominent right posterior communicating artery. PCAs perfused to their distal aspects without stenosis. The left posterior communicating artery is not visualized. Venous sinuses: As permitted by contrast timing, patent. Anatomic variants: Near fetal origin of the right PCA. Review of the MIP images confirms the above findings IMPRESSION: 1. No intracranial large vessel occlusion or significant stenosis. 2. No hemodynamically significant stenosis in the neck. 3. Scattered nodular opacities in the superior left lower lobe, similar to 01/28/2022, which may be infectious or inflammatory. 4. Redemonstrated sclerotic lesion in the left aspect of T5, as well as a punctate lesion in the left aspect of T1. Additional sclerotic focus in the left lateral mass of C2, concerning for an additional site of osseous metastatic disease. 5. Linear hypodensity in the left cerebellum correlates with the acute infarcts seen on the same-day MRI. No additional acute intracranial process. Electronically Signed   By: Merilyn Baba M.D.   On: 02/26/2022 18:48   MR Brain W and Wo Contrast  Result Date: 02/26/2022 CLINICAL DATA:  Brain/CNS neoplasm, assess treatment response EXAM: MRI HEAD WITHOUT AND WITH CONTRAST TECHNIQUE: Multiplanar, multiecho pulse sequences of  the brain and surrounding structures were obtained without and with intravenous contrast. CONTRAST:  43m GADAVIST GADOBUTROL 1 MMOL/ML IV SOLN COMPARISON:  MRI August 14, 2020. FINDINGS: Brain: Similar size of an extra-axial dural-based lesion along the right frontoparietal convexity, which measures 1.4 cm on series 16, image 143. Similar versus slightly decreased conspicuity of a tiny lesion in the posterior temporal lobe (series 16, image 104) with faint susceptibility artifact (series 11, image 33). Otherwise, no abnormal enhancement. No new lesions identified. 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 Slight edema but no mass effect. No evidence of acute hemorrhage or midline shift. No hydrocephalus. Vascular: Major arterial flow voids are maintained at the skull base. Skull and upper cervical spine: Normal marrow signal. Sinuses/Orbits: Clear sinuses.  No acute orbital findings. Other: No mastoid effusions. IMPRESSION: 1. 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 Slight edema but no mass effect. 2. Similar versus slightly decreased conspicuity of a tiny treated lesion the posterior right temporal lobe. No progressive disease. 3. Unchanged extra-axial 1.4 cm lesion along the right chronic temporal convexity, probably a meningioma. Electronically Signed   By: FMargaretha SheffieldM.D.   On: 02/26/2022 15:07   DG Chest Port 1 View  Result Date: 02/26/2022 CLINICAL DATA:  Generalized weakness EXAM: PORTABLE CHEST 1 VIEW COMPARISON:  06/30/2020 FINDINGS: Cardiac size is within normal limits. Low position of diaphragms suggests COPD. Small to moderate left pleural effusion is seen. There is interval removal of left chest tube. Increased interstitial markings are seen in left lower lung field. There is no focal consolidation. There is no significant right pleural effusion. There is no pneumothorax. IMPRESSION: Small to moderate left pleural effusion. Increased markings in left lower lung fields  suggest possible interstitial pneumonia. Electronically Signed   By: PElmer PickerM.D.   On: 02/26/2022 14:14     ASSESSMENT/PLAN:  This is a very pleasant 88year old Caucasian female diagnosed with a stage IV (T3, N3, M1 C) non-small cell lung cancer, adenocarcinoma with positive  EGFR mutation with deletion in exon 19 diagnosed in December 2021. She presented with a left lower lobe lung mass in addition to right supraclavicular, left hilar, and subcarinal lymphadenopathy. She also had metastatic adenopathy in the abdomen and left pleural based metastasis, and a malignant left pleural effusion and multiple brain metastasis.    The patient started treatment with Tagrisso 80 mg p.o. daily on June 10, 2020.  Starting from October 2022 her dose has changed to 40 mg p.o. daily due to intolerance.     The patient recently had a restaging CT scan that was reviewed at her last appointment on 01/30/22. The scan showed concern with progressive lymphangitic tumor spread and left sided pleural-based metastasis.  There is also some progressive thoracic, lower cervical, and upper abdominal adenopathy.   The patient had repeat molecular studies by [facility] which was negative for new resistant mutation.   Therefore, started palliative systemic chemotherapy with carboplatin for an AUC of 5, Alimta 500 mg per metered square, and Avastin IV every 3 weeks. She underwent her first dose of treatment on 02/20/2022.  Following cycle #1, the patient had a small acute cerebellar stroke.  Reviewed with Dr. Julien Nordmann.  Labs were reviewed.  Moving forward, we will remove Avastin from the care plan as Avastin can increase the risk of thromboembolic events.  She will proceed on treatment with carboplatin and Alimta.  Reviewed the patient's schedule with her.  She will require weekly labs.  I will reach out to scheduling.  Additionally, she is due for cycle #3 on 11/22.  I do not see any infusion scheduled.  I will  reach out to scheduling to continue to have appointments to reschedule.  We will see her back for follow-up visit in 3 weeks for evaluation and repeat blood work before undergoing cycle #3.  She will continue to follow with neurology due to her recent stroke.  She will continue to follow with PT and OT regarding her weakness and help with her balance.  She reports metoprolol dose was recently reduced due to orthostatic hypotension.  The patient reports she developed some bilateral lower extremity swelling from her hospitalization.  She had a Doppler ultrasound of lower extremities performed on 10/18 which was negative.  The patient has been elevating her legs and states this is helping.  Denies any calf pain or erythema.  She is going to talk to her physical therapist about measuring her for compression stockings.  I did strongly encourage the patient to please call the office for any new or worsening lower extremity swelling or developing pain or erythema.  We may consider repeating her lower extremity ultrasound if worsening symptoms.  The patient was advised to call immediately if she has any concerning symptoms in the interval. The patient voices understanding of current disease status and treatment options and is in agreement with the current care plan. All questions were answered. The patient knows to call the clinic with any problems, questions or concerns. We can certainly see the patient much sooner if necessary            Orders Placed This Encounter  Procedures   CBC with Differential ([facility] Only)    Standing Status:   Future    Standing Expiration Date:   03/14/2023   CMP ([facility] only)    Standing Status:   Future    Standing Expiration Date:   03/14/2023   CBC with Differential ([facility] Only)    Standing Status:  Future    Standing Expiration Date:   04/04/2023   CMP ([facility] only)    Standing Status:   Future    Standing Expiration Date:    04/04/2023   Total Protein, Urine dipstick    Standing Status:   Future    Standing Expiration Date:   04/04/2023     The total time spent in the appointment was 30-39 minutes  [place], PA-C 03/13/22

## 2022-03-13 ENCOUNTER — Inpatient Hospital Stay (HOSPITAL_BASED_OUTPATIENT_CLINIC_OR_DEPARTMENT_OTHER): Payer: Medicare PPO | Admitting: Physician Assistant

## 2022-03-13 ENCOUNTER — Inpatient Hospital Stay: Payer: Medicare PPO

## 2022-03-13 ENCOUNTER — Inpatient Hospital Stay: Payer: Medicare PPO | Attending: Physician Assistant

## 2022-03-13 VITALS — BP 150/72 | HR 78 | Temp 97.8°F | Resp 16 | Ht 64.0 in | Wt 104.7 lb

## 2022-03-13 VITALS — BP 146/78 | HR 75 | Resp 16

## 2022-03-13 DIAGNOSIS — Z8673 Personal history of transient ischemic attack (TIA), and cerebral infarction without residual deficits: Secondary | ICD-10-CM | POA: Insufficient documentation

## 2022-03-13 DIAGNOSIS — C3492 Malignant neoplasm of unspecified part of left bronchus or lung: Secondary | ICD-10-CM

## 2022-03-13 DIAGNOSIS — R590 Localized enlarged lymph nodes: Secondary | ICD-10-CM | POA: Diagnosis not present

## 2022-03-13 DIAGNOSIS — Z5111 Encounter for antineoplastic chemotherapy: Secondary | ICD-10-CM

## 2022-03-13 DIAGNOSIS — C778 Secondary and unspecified malignant neoplasm of lymph nodes of multiple regions: Secondary | ICD-10-CM | POA: Diagnosis not present

## 2022-03-13 DIAGNOSIS — C3412 Malignant neoplasm of upper lobe, left bronchus or lung: Secondary | ICD-10-CM

## 2022-03-13 DIAGNOSIS — C782 Secondary malignant neoplasm of pleura: Secondary | ICD-10-CM | POA: Insufficient documentation

## 2022-03-13 DIAGNOSIS — Z79899 Other long term (current) drug therapy: Secondary | ICD-10-CM | POA: Insufficient documentation

## 2022-03-13 DIAGNOSIS — C3432 Malignant neoplasm of lower lobe, left bronchus or lung: Secondary | ICD-10-CM | POA: Diagnosis not present

## 2022-03-13 LAB — CBC WITH DIFFERENTIAL (CANCER CENTER ONLY)
Abs Immature Granulocytes: 0.06 10*3/uL (ref 0.00–0.07)
Basophils Absolute: 0 10*3/uL (ref 0.0–0.1)
Basophils Relative: 0 %
Eosinophils Absolute: 0 10*3/uL (ref 0.0–0.5)
Eosinophils Relative: 0 %
HCT: 33.9 % — ABNORMAL LOW (ref 36.0–46.0)
Hemoglobin: 11.8 g/dL — ABNORMAL LOW (ref 12.0–15.0)
Immature Granulocytes: 1 %
Lymphocytes Relative: 5 %
Lymphs Abs: 0.4 10*3/uL — ABNORMAL LOW (ref 0.7–4.0)
MCH: 31.4 pg (ref 26.0–34.0)
MCHC: 34.8 g/dL (ref 30.0–36.0)
MCV: 90.2 fL (ref 80.0–100.0)
Monocytes Absolute: 1.2 10*3/uL — ABNORMAL HIGH (ref 0.1–1.0)
Monocytes Relative: 16 %
Neutro Abs: 5.6 10*3/uL (ref 1.7–7.7)
Neutrophils Relative %: 78 %
Platelet Count: 450 10*3/uL — ABNORMAL HIGH (ref 150–400)
RBC: 3.76 MIL/uL — ABNORMAL LOW (ref 3.87–5.11)
RDW: 15.2 % (ref 11.5–15.5)
WBC Count: 7.2 10*3/uL (ref 4.0–10.5)
nRBC: 0 % (ref 0.0–0.2)

## 2022-03-13 LAB — CMP (CANCER CENTER ONLY)
ALT: 29 U/L (ref 0–44)
AST: 20 U/L (ref 15–41)
Albumin: 3.7 g/dL (ref 3.5–5.0)
Alkaline Phosphatase: 88 U/L (ref 38–126)
Anion gap: 6 (ref 5–15)
BUN: 16 mg/dL (ref 8–23)
CO2: 25 mmol/L (ref 22–32)
Calcium: 10.3 mg/dL (ref 8.9–10.3)
Chloride: 103 mmol/L (ref 98–111)
Creatinine: 0.9 mg/dL (ref 0.44–1.00)
GFR, Estimated: >60 mL/min (ref >60)
Glucose, Bld: 144 mg/dL — ABNORMAL HIGH (ref 70–99)
Potassium: 4.2 mmol/L (ref 3.5–5.1)
Sodium: 134 mmol/L — ABNORMAL LOW (ref 135–145)
Total Bilirubin: 0.3 mg/dL (ref 0.3–1.2)
Total Protein: 6.3 g/dL — ABNORMAL LOW (ref 6.5–8.1)

## 2022-03-13 LAB — TSH: TSH: 2.294 u[IU]/mL (ref 0.350–4.500)

## 2022-03-13 LAB — TOTAL PROTEIN, URINE DIPSTICK: Protein, ur: <30 mg/dL — AB

## 2022-03-13 MED ORDER — SODIUM CHLORIDE 0.9 % IV SOLN
10.0000 mg | Freq: Once | INTRAVENOUS | Status: AC
  Administered 2022-03-13: 10 mg via INTRAVENOUS
  Filled 2022-03-13: qty 10

## 2022-03-13 MED ORDER — PALONOSETRON HCL INJECTION 0.25 MG/5ML
0.2500 mg | Freq: Once | INTRAVENOUS | Status: AC
  Administered 2022-03-13: 0.25 mg via INTRAVENOUS
  Filled 2022-03-13: qty 5

## 2022-03-13 MED ORDER — SODIUM CHLORIDE 0.9 % IV SOLN
245.5000 mg | Freq: Once | INTRAVENOUS | Status: AC
  Administered 2022-03-13: 250 mg via INTRAVENOUS
  Filled 2022-03-13: qty 25

## 2022-03-13 MED ORDER — SODIUM CHLORIDE 0.9 % IV SOLN
375.0000 mg/m2 | Freq: Once | INTRAVENOUS | Status: AC
  Administered 2022-03-13: 500 mg via INTRAVENOUS
  Filled 2022-03-13: qty 20

## 2022-03-13 MED ORDER — SODIUM CHLORIDE 0.9 % IV SOLN: Freq: Once | INTRAVENOUS | Status: AC

## 2022-03-13 MED ORDER — SODIUM CHLORIDE 0.9 % IV SOLN
150.0000 mg | Freq: Once | INTRAVENOUS | Status: AC
  Administered 2022-03-13: 150 mg via INTRAVENOUS
  Filled 2022-03-13: qty 150

## 2022-03-13 NOTE — Patient Instructions (Signed)
[facility]   Discharge Instructions: Thank you for choosing [facility] to provide your oncology and hematology care.   If you have a lab appointment with the [facility], please go directly to the [facility] and check in at the registration area.   Wear comfortable clothing and clothing appropriate for easy access to any Portacath or PICC line.   We strive to give you quality time with your provider. You may need to reschedule your appointment if you arrive late (15 or more minutes).  Arriving late affects you and other patients whose appointments are after yours.  Also, if you miss three or more appointments without notifying the office, you may be dismissed from the clinic at the provider's discretion.      For prescription refill requests, have your pharmacy contact our office and allow 72 hours for refills to be completed.    Today you received the following chemotherapy and/or immunotherapy agents: Pemetrexed (Alimta) and Carboplatin       To help prevent nausea and vomiting after your treatment, we encourage you to take your nausea medication as directed.  BELOW ARE SYMPTOMS THAT SHOULD BE REPORTED IMMEDIATELY: *FEVER GREATER THAN 100.4 F (38 C) OR HIGHER *CHILLS OR SWEATING *NAUSEA AND VOMITING THAT IS NOT CONTROLLED WITH YOUR NAUSEA MEDICATION *UNUSUAL SHORTNESS OF BREATH *UNUSUAL BRUISING OR BLEEDING *URINARY PROBLEMS (pain or burning when urinating, or frequent urination) *BOWEL PROBLEMS (unusual diarrhea, constipation, pain near the anus) TENDERNESS IN MOUTH AND THROAT WITH OR WITHOUT PRESENCE OF ULCERS (sore throat, sores in mouth, or a toothache) UNUSUAL RASH, SWELLING OR PAIN  UNUSUAL VAGINAL DISCHARGE OR ITCHING   Items with * indicate a potential emergency and should be followed up as soon as possible or go to the Emergency Department if any problems should occur.  Please show the CHEMOTHERAPY ALERT CARD or  IMMUNOTHERAPY ALERT CARD at check-in to the Emergency Department and triage nurse.  Should you have questions after your visit or need to cancel or reschedule your appointment, please contact [facility]  Dept: [phone]  and follow the prompts.  Office hours are 8:00 a.m. to 4:30 p.m. Monday - Friday. Please note that voicemails left after 4:00 p.m. may not be returned until the following business day.  We are closed weekends and major holidays. You have access to a nurse at all times for urgent questions. Please call the main number to the clinic Dept: [phone] and follow the prompts.   For any non-urgent questions, you may also contact your provider using MyChart. We now offer e-Visits for anyone 19 and older to request care online for non-urgent symptoms. For details visit [email or web address].   Also download the MyChart app! Go to the app store, search "MyChart", open the app, select [facility], and log in with your MyChart username and password.  Masks are optional in the cancer centers. If you would like for your care team to wear a mask while they are taking care of you, please let them know. You may have one support person who is at least 86 years old accompany you for your appointments.

## 2022-03-14 ENCOUNTER — Encounter: Payer: Self-pay | Admitting: *Deleted

## 2022-03-14 NOTE — Progress Notes (Signed)
Patient ID: Shelley Flores, female   DOB: October 21, 1932, 88 y.o.   MRN: [id number] Patient enrolled for [employer] to ship a 30 day cardiac event monitor to:  [facility] Attn:  Miliani De[address], [place], [place]  [address]  Telephone # [phone]  Letter with instructions mailed to patient and sent by MyChart.

## 2022-03-15 ENCOUNTER — Other Ambulatory Visit: Payer: Self-pay

## 2022-03-18 ENCOUNTER — Telehealth: Payer: Self-pay | Admitting: Medical Oncology

## 2022-03-18 NOTE — Telephone Encounter (Signed)
LVM for nursing to return my call to set up weekly labs to be drawn at [facility].

## 2022-03-19 ENCOUNTER — Telehealth: Payer: Self-pay | Admitting: Medical Oncology

## 2022-03-19 NOTE — Telephone Encounter (Signed)
I told Shelley Flores we have not received the fax number for [facility] to draw her labs,

## 2022-03-20 ENCOUNTER — Telehealth: Payer: Self-pay | Admitting: Medical Oncology

## 2022-03-20 ENCOUNTER — Telehealth: Payer: Self-pay | Admitting: Internal Medicine

## 2022-03-20 ENCOUNTER — Inpatient Hospital Stay: Payer: Medicare PPO

## 2022-03-20 NOTE — Telephone Encounter (Signed)
Lab Collection-Contacted [facility]  ( [place] [facility]) to set up lab collection.  LV to return my call and gave them fax number to fax any order sheets to complete if needed.

## 2022-03-20 NOTE — Telephone Encounter (Signed)
Called patient regarding upcoming all upcoming appointments, Informed patient she missed her 11/08 lab appointment and left a voicemail for her to reschedule.

## 2022-03-20 NOTE — Telephone Encounter (Signed)
Lab collection-[facility] [place] [facility] will collect blood every Thursday and send blood [facility]. [facility] is supposed to fax the results to Dr Julien Nordmann.

## 2022-03-21 LAB — CBC AND DIFFERENTIAL
HCT: 32 — AB (ref 36–46)
Hemoglobin: 10.8 — AB (ref 12.0–16.0)
Platelets: 149 10*3/uL — AB (ref 150–400)
WBC: 1.8

## 2022-03-21 LAB — COMPREHENSIVE METABOLIC PANEL
Albumin: 3.3 — AB (ref 3.5–5.0)
Calcium: 9.3 (ref 8.7–10.7)
Globulin: 1.7
eGFR: 84

## 2022-03-21 LAB — BASIC METABOLIC PANEL
BUN: 20 (ref 4–21)
CO2: 23 — AB (ref 13–22)
Chloride: 103 (ref 99–108)
Creatinine: 0.7 (ref 0.5–1.1)
Glucose: 78
Potassium: 4.5 mEq/L (ref 3.5–5.1)
Sodium: 132 — AB (ref 137–147)

## 2022-03-21 LAB — HEPATIC FUNCTION PANEL
ALT: 23 U/L (ref 7–35)
AST: 23 (ref 13–35)
Alkaline Phosphatase: 73 (ref 25–125)

## 2022-03-21 LAB — CBC: RBC: 3.44 — AB (ref 3.87–5.11)

## 2022-03-22 ENCOUNTER — Encounter: Payer: Self-pay | Admitting: Nurse Practitioner

## 2022-03-22 ENCOUNTER — Non-Acute Institutional Stay (SKILLED_NURSING_FACILITY): Payer: Medicare PPO | Admitting: Nurse Practitioner

## 2022-03-22 DIAGNOSIS — I951 Orthostatic hypotension: Secondary | ICD-10-CM | POA: Diagnosis not present

## 2022-03-22 DIAGNOSIS — R000 Tachycardia, unspecified: Secondary | ICD-10-CM

## 2022-03-22 DIAGNOSIS — E782 Mixed hyperlipidemia: Secondary | ICD-10-CM

## 2022-03-22 DIAGNOSIS — D638 Anemia in other chronic diseases classified elsewhere: Secondary | ICD-10-CM

## 2022-03-22 DIAGNOSIS — R0609 Other forms of dyspnea: Secondary | ICD-10-CM

## 2022-03-22 DIAGNOSIS — C3492 Malignant neoplasm of unspecified part of left bronchus or lung: Secondary | ICD-10-CM | POA: Diagnosis not present

## 2022-03-22 DIAGNOSIS — I10 Essential (primary) hypertension: Secondary | ICD-10-CM | POA: Diagnosis not present

## 2022-03-22 DIAGNOSIS — Z8673 Personal history of transient ischemic attack (TIA), and cerebral infarction without residual deficits: Secondary | ICD-10-CM

## 2022-03-22 DIAGNOSIS — R609 Edema, unspecified: Secondary | ICD-10-CM

## 2022-03-22 DIAGNOSIS — E871 Hypo-osmolality and hyponatremia: Secondary | ICD-10-CM

## 2022-03-22 LAB — CBC: RBC: 3.54 — AB (ref 3.87–5.11)

## 2022-03-22 LAB — CBC AND DIFFERENTIAL
HCT: 32 — AB (ref 36–46)
Hemoglobin: 11 — AB (ref 12.0–16.0)
Neutrophils Absolute: 2459
Platelets: 120 10*3/uL — AB (ref 150–400)
WBC: 3.3

## 2022-03-22 NOTE — Assessment & Plan Note (Signed)
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

## 2022-03-22 NOTE — Assessment & Plan Note (Signed)
Edema BLE, off Spironolactone.

## 2022-03-22 NOTE — Assessment & Plan Note (Signed)
Controlled, on Midodrine.

## 2022-03-22 NOTE — Assessment & Plan Note (Signed)
Na 132 03/21/22

## 2022-03-22 NOTE — Assessment & Plan Note (Signed)
Hgb 10.8 03/21/22

## 2022-03-22 NOTE — Assessment & Plan Note (Signed)
Blood pressure is controlled, on Metoprolol

## 2022-03-22 NOTE — Assessment & Plan Note (Signed)
2.2 02/27/22 

## 2022-03-22 NOTE — Assessment & Plan Note (Signed)
heart rate is in control, on Metoprolol

## 2022-03-22 NOTE — Assessment & Plan Note (Signed)
Hx of a large left pleural effusion, s/p thoracentesis x2, drained 1.5L, Cytology showed adenocarcinoma stage IV lung cancer, CT showed left upper lobe mass invades the mediastinum at the level of the aortic or pulmonayr window, concerning for lymphangitic spread of disease,  f/u pulmonology Dr. Valeta Harms,  oncology Dr. Julien Nordmann, taking palliative chemotherapy/immunotherapy, on Tagrisso. CXR in this hospitalization showed small to moderate left pleural effusion. Decadron before and after chemo.

## 2022-03-22 NOTE — Assessment & Plan Note (Signed)
SOB/mild valvular heart disease, Echo EF 65-70%

## 2022-03-22 NOTE — Assessment & Plan Note (Signed)
takes Atorvastatin, LDL 67 02/27/22

## 2022-03-22 NOTE — Progress Notes (Unsigned)
Location:   [facility] Room Number: 9 Place of Service:  SNF (31) Provider: Lennie Odor Chrisean Kloth NP  [name], MD  Patient Care Team: [name], MD as PCP - General (Internal Medicine) Debara Pickett Nadean Corwin, MD as PCP - Cardiology (Cardiology) Valrie Hart, RN as Oncology Nurse Navigator (Oncology)  Extended Emergency Contact Information Primary Emergency Contact: Smithwick,Pat Home Phone: [phone] Mobile Phone: [phone] Relation: Friend Secondary Emergency Contact: Genice Rouge Address: [place], [place] [address] [place] Phone: [phone] Work Phone: [phone] Relation: Other  Code Status:  DNR Goals of care: Advanced Directive information    03/26/2022    2:18 PM  Advanced Directives  Does Patient Have a Medical Advance Directive? Yes  Type of Paramedic of [place];Living will  Does patient want to make changes to medical advance directive? No - Patient declined  Copy of [place] in Chart? Yes - validated most recent copy scanned in chart (See row information)     Chief Complaint  Patient presents with   Medical Management of Chronic Issues    HPI:  [name] seen today for medical management of chronic diseases.       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              Orthostatic hypotension, on Midodrine.               Hx of a large left pleural effusion, s/p thoracentesis x2, drained 1.5L, Cytology showed adenocarcinoma stage IV lung cancer, CT showed left upper lobe mass invades the mediastinum at the level of the aortic or pulmonayr window, concerning for lymphangitic spread of disease,  f/u pulmonology Dr. Valeta Harms,  oncology Dr. Julien Nordmann, taking palliative chemotherapy/immunotherapy, on Tagrisso. CXR in this hospitalization showed  small to moderate left pleural effusion. Decadron before and after chemo.  Anemia, Hgb 10.8 03/21/22             HTN, controlled on Metoprolol  Tachycardia, heart rate is in control, on Metoprolol             Hyperlipidemia, takes Atorvastatin, LDL 67 02/27/22             SOB/mild valvular heart disease, Echo EF 65-70%             Hypophosphatemia, 2.2 02/27/22             Hyponatremia, Na 132 03/21/22             Edema BLE, off Spironolactone.   Past Medical History:  Diagnosis Date   Bruit    Abdominal bruit - Abdominal aorta/renal duplex Doppler evaluation 12/07/03 -Mildly abnormal evaluation. 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 This is consistant w/median arcuate ligament compression syndrome. *Right & Left Kidney: Essentially equal in size, symmetrical in shape w/no significant abnormalities visualized. *Right & Left Renal Arteries: No significant abnormalities.   Cancer ([place])    Edema extremities    LE edema    H/O myocardial perfusion scan 02/20/00   To rule out ischemia - Negative adequate Bruce protocol exercise stress test with a deconditioned exercise response and normal static myocardial perfusion images with EF calculated by QGS of 77%. Represents a low risk study.   Hyperlipidemia  Hypertension    Osteoarthritis    Osteoporosis    Pleural effusion 04/2020   Valvular heart disease    Mild valvular heart disease by Echo in 2010, all mild and symptomatic, including concentric LVH, MR, TR, and AI with pulmonary artery pressure of 32. EF was normal.   Past Surgical History:  Procedure Laterality Date   Abdominal aorta/Renal duplex Doppler Evaluation  12/07/03   For abdominal bruit. Mildly abnormal evaluation. (See bruit in medical history)   BREAST EXCISIONAL BIOPSY Left 1966   BREAST EXCISIONAL BIOPSY Left 1999   CATARACT EXTRACTION  2003   CHEST TUBE INSERTION N/A 07/04/2020   Procedure: REMOVAL PLEURAL DRAINAGE CATHETER;  Surgeon: Garner Nash, DO;   Location: [facility];  Service: Pulmonary;  Laterality: N/A;   COLONOSCOPY  10/22/2004   DEXA Bone Scan  06/23/2013   IR PERC PLEURAL DRAIN W/INDWELL CATH W/IMG GUIDE  05/10/2020   IR THORACENTESIS ASP PLEURAL SPACE W/IMG GUIDE  05/04/2020    Allergies  Allergen Reactions   Irbesartan Nausea Only    Allergies as of 03/22/2022       Reactions   Irbesartan Nausea Only        Medication List        Accurate as of March 22, 2022 11:59 PM. If you have any questions, ask your nurse or doctor.          acetaminophen 325 MG tablet Commonly known as: TYLENOL Take 2 tablets (650 mg total) by mouth every 6 (six) hours as needed for mild pain (or Fever >/= 101).   aspirin 81 MG tablet Take 81 mg by mouth daily.   atorvastatin 20 MG tablet Commonly known as: LIPITOR TAKE 1 TABLET BY MOUTH EVERYDAY AT BEDTIME   bisacodyl 5 MG EC tablet Commonly known as: DULCOLAX Take 5 mg by mouth daily as needed for moderate constipation.   clopidogrel 75 MG tablet Commonly known as: PLAVIX Take 75 mg by mouth daily.   dexamethasone 4 MG tablet Commonly known as: DECADRON Please take 1 tablet twice a day the day before, the day of, and the day after chemotherapy   folic acid 1 MG tablet Commonly known as: FOLVITE Take 1 tablet (1 mg total) by mouth daily.   iVIZIA Dry Eyes 0.5 % Soln Generic drug: Povidone (PF) Place 1 drop into both eyes in the morning.   magnesium hydroxide 400 MG/5ML suspension Commonly known as: MILK OF MAGNESIA Take 15 mLs by mouth daily as needed for mild constipation.   metoprolol tartrate 25 MG tablet Commonly known as: LOPRESSOR Take 12.5 mg by mouth 2 (two) times daily.   midodrine 2.5 MG tablet Commonly known as: PROAMATINE Take 1 tablet (2.5 mg total) by mouth 2 (two) times daily as needed (if sbp <100 on standing.).   prochlorperazine 10 MG tablet Commonly known as: COMPAZINE Take 1 tablet (10 mg total) by mouth every 6 (six) hours as  needed.   Tagrisso 40 MG tablet Generic drug: osimertinib mesylate Take 1 tablet (40 mg total) by mouth daily.        Review of Systems  Constitutional:  Negative for appetite change, fatigue and fever.       Persisted poor appetite and fatigue.   HENT:  Positive for hearing loss. Negative for congestion and trouble swallowing.   Eyes:  Negative for visual disturbance.  Respiratory:  Positive for shortness of breath. Negative for cough and wheezing.   Cardiovascular:  Positive for leg swelling. Negative  for chest pain and palpitations.  Gastrointestinal:  Negative for abdominal pain and constipation.       Poor appetite, early satiety.   Genitourinary:  Negative for dysuria, hematuria and urgency.  Musculoskeletal:  Positive for gait problem.  Skin:  Negative for color change.  Neurological:  Negative for speech difficulty, weakness, light-headedness and headaches.  Psychiatric/Behavioral:  Negative for behavioral problems and sleep disturbance. The patient is not nervous/anxious.     Immunization History  Administered Date(s) Administered   Fluad Quad(high Dose 65+) 01/06/2019, 02/08/2020, 02/27/2022   Influenza Split 08/14/2010, 01/16/2012, 02/02/2013, 01/24/2015, 02/01/2016, 02/19/2017, 01/28/2018   Influenza, High Dose Seasonal PF 01/24/2015, 02/01/2016, 01/28/2018, 02/15/2021   Influenza-Unspecified 02/01/2016   MODERNA COVID-19 SARS-COV-2 PEDS BIVALENT BOOSTER 6Y-11Y 10/24/2021   Moderna Covid-19 Vaccine Bivalent Booster 62yrs & up 03/14/2022   Moderna SARS-COV2 Booster Vaccination 10/18/2020   Moderna Sars-Covid-2 Vaccination 06/14/2019, 03/27/2020, 03/27/2020   PFIZER(Purple Top)SARS-COV-2 Vaccination 01/31/2021   Pneumococcal Conjugate-13 08/11/2013, 09/03/2013   Pneumococcal Polysaccharide-23 05/28/2004   Td 07/04/2000   Tdap 07/04/2009, 10/18/2019   Unspecified SARS-COV-2 Vaccination 03/27/2020   Zoster, Live 07/02/2005   Pertinent  Health Maintenance Due   Topic Date Due   INFLUENZA VACCINE  Completed   DEXA SCAN  Completed      03/01/2022   10:09 PM 03/02/2022    7:35 AM 03/13/2022   11:59 AM 03/22/2022    3:07 PM 03/26/2022    2:18 PM  Fall Risk  Falls in the past year?    0 0  Was there an injury with Fall?    0 0  Fall Risk Category Calculator    0 0  Fall Risk Category    Low Low  Patient Fall Risk Level High fall risk Moderate fall risk High fall risk High fall risk High fall risk  Patient at Risk for Falls Due to    History of fall(s);Impaired balance/gait;Impaired mobility History of fall(s);Impaired balance/gait;Impaired mobility  Fall risk Follow up    Falls evaluation completed Falls evaluation completed   Functional Status Survey:    Vitals:   03/22/22 1305  BP: (!) 147/72  Pulse: 78  Resp: 18  Temp: (!) 97.4 F (36.3 C)  SpO2: 98%   There is no height or weight on file to calculate BMI. Physical Exam Vitals and nursing note reviewed.  Constitutional:      Appearance: Normal appearance.  HENT:     Head: Normocephalic and atraumatic.     Mouth/Throat:     Mouth: Mucous membranes are moist.     Pharynx: Oropharynx is clear.  Eyes:     Extraocular Movements: Extraocular movements intact.     Conjunctiva/sclera: Conjunctivae normal.     Pupils: Pupils are equal, round, and reactive to light.  Cardiovascular:     Rate and Rhythm: Normal rate and regular rhythm.     Heart sounds: Murmur heard.     Comments: Murmur is much more prominent upon standing and increased heart rate Pulmonary:     Effort: Pulmonary effort is normal.     Breath sounds: No wheezing, rhonchi or rales.     Comments: Decreased breath sounds on left consistent with effusion noted on chest x-ray Abdominal:     General: Abdomen is flat. Bowel sounds are normal.     Palpations: Abdomen is soft.     Tenderness: There is no abdominal tenderness.  Musculoskeletal:     Cervical back: Normal range of motion and neck supple.  Right lower  leg: Edema present.     Left lower leg: Edema present.     Comments: 1+ edema BLE  Skin:    General: Skin is warm and dry.  Neurological:     General: No focal deficit present.     Mental Status: She is alert and oriented to person, place, and time. Mental status is at baseline.     Gait: Gait abnormal.     Comments: Mild left face weakness.   Psychiatric:        Mood and Affect: Mood normal.        Behavior: Behavior normal.        Thought Content: Thought content normal.        Judgment: Judgment normal.     Labs reviewed: Recent Labs    02/26/22 1351 02/26/22 1722 02/27/22 0430 03/13/22 1102  NA 130*  --  133* 134*  K 5.3* 4.8 4.8 4.2  CL 103  --  108 103  CO2 22  --  20* 25  GLUCOSE 116*  --  94 144*  BUN 41*  --  25* 16  CREATININE 1.05*  --  0.81 0.90  CALCIUM 9.8  --  9.3 10.3  MG  --   --  2.0  --   PHOS  --   --  2.2*  --    Recent Labs    02/26/22 1351 02/27/22 0430 03/13/22 1102  AST 25 25 20   ALT 18 18 29   ALKPHOS 55 56 88  BILITOT 0.8 0.7 0.3  PROT 5.8* 5.5* 6.3*  ALBUMIN 3.3* 3.0* 3.7   Recent Labs    02/26/22 1351 02/27/22 0430 03/13/22 1102  WBC 8.1 6.5 7.2  NEUTROABS 7.4 5.1 5.6  HGB 12.7 12.7 11.8*  HCT 37.7 38.1 33.9*  MCV 91.5 91.1 90.2  PLT 149* 132* 450*   Lab Results  Component Value Date   TSH 2.294 03/13/2022   Lab Results  Component Value Date   HGBA1C 5.7 (H) 02/26/2022   Lab Results  Component Value Date   CHOL 143 02/27/2022   HDL 59 02/27/2022   LDLCALC 67 02/27/2022   TRIG 83 02/27/2022   CHOLHDL 2.4 02/27/2022    Significant Diagnostic Results in last 30 days:  DG Chest 2 View  Result Date: 03/01/2022 CLINICAL DATA:  Pleural effusions.  History of lung cancer. EXAM: CHEST - 2 VIEW COMPARISON:  Chest radiograph dated 02/26/2022. FINDINGS: Bilateral pleural effusions, left greater than right relatively similar to prior radiograph. Left mid to lower lung field reticulonodular interstitial densities again  noted. Left perihilar density there evaluated on the CT. Small linear lucency along the left upper mediastinum to the left of the trachea, likely artifactual. A small left pneumothorax is less likely. Attention on follow-up imaging recommended. The cardiac silhouette is within limits. Atherosclerotic calcification of the aorta. Degenerative changes of the spine. No acute osseous pathology. IMPRESSION: Similar appearance of the lungs with small bilateral pleural effusions. Electronically Signed   By: Anner Crete M.D.   On: 03/01/2022 19:09   VAS [place] LOWER EXTREMITY VENOUS (DVT)  Result Date: 02/27/2022  Lower Venous DVT Study Patient Name:  RAIMI GUILLERMO  Date of Exam:   02/27/2022 Medical Rec #: [id number]           Accession #:    [id number] Date of Birth: [birth year]            Patient Gender: F Patient Age:   26 years  Exam Location:  [facility] Procedure:      VAS [place] LOWER EXTREMITY VENOUS (DVT) Referring Phys: MCNEILL KIRKPATRICK --------------------------------------------------------------------------------  Indications: Stroke, and "leg stiffness".  Risk Factors: Chemotherapy. Comparison Study: Previous exam on 05/04/20 was negative for DVT Performing Technologist: Rogelia Rohrer RVT, RDMS  Examination Guidelines: A complete evaluation includes B-mode imaging, spectral Doppler, color Doppler, and power Doppler as needed of all accessible portions of each vessel. Bilateral testing is considered an integral part of a complete examination. Limited examinations for reoccurring indications may be performed as noted. The reflux portion of the exam is performed with the patient in reverse Trendelenburg.  +---------+---------------+---------+-----------+----------+--------------+ RIGHT    CompressibilityPhasicitySpontaneityPropertiesThrombus Aging +---------+---------------+---------+-----------+----------+--------------+ CFV      Full           Yes      Yes                                  +---------+---------------+---------+-----------+----------+--------------+ SFJ      Full                                                        +---------+---------------+---------+-----------+----------+--------------+ FV Prox  Full           Yes      Yes                                 +---------+---------------+---------+-----------+----------+--------------+ FV Mid   Full           Yes      Yes                                 +---------+---------------+---------+-----------+----------+--------------+ FV DistalFull           Yes      Yes                                 +---------+---------------+---------+-----------+----------+--------------+ PFV      Full                                                        +---------+---------------+---------+-----------+----------+--------------+ POP      Full           Yes      Yes                                 +---------+---------------+---------+-----------+----------+--------------+ PTV      Full                                                        +---------+---------------+---------+-----------+----------+--------------+ PERO     Full                                                        +---------+---------------+---------+-----------+----------+--------------+   +---------+---------------+---------+-----------+----------+--------------+  LEFT     CompressibilityPhasicitySpontaneityPropertiesThrombus Aging +---------+---------------+---------+-----------+----------+--------------+ CFV      Full           Yes      Yes                                 +---------+---------------+---------+-----------+----------+--------------+ SFJ      Full                                                        +---------+---------------+---------+-----------+----------+--------------+ FV Prox  Full           Yes      Yes                                  +---------+---------------+---------+-----------+----------+--------------+ FV Mid   Full           Yes      Yes                                 +---------+---------------+---------+-----------+----------+--------------+ FV DistalFull           Yes      Yes                                 +---------+---------------+---------+-----------+----------+--------------+ PFV      Full                                                        +---------+---------------+---------+-----------+----------+--------------+ POP      Full           Yes      Yes                                 +---------+---------------+---------+-----------+----------+--------------+ PTV      Full                                                        +---------+---------------+---------+-----------+----------+--------------+ PERO     Full                                                        +---------+---------------+---------+-----------+----------+--------------+     Summary: BILATERAL: - No evidence of deep vein thrombosis seen in the lower extremities, bilaterally. -No evidence of popliteal cyst, bilaterally.   *See table(s) above for measurements and observations. Electronically signed by Harold Barban MD on 02/27/2022 at 11:50:36 PM.    Final    ECHOCARDIOGRAM COMPLETE  Result Date: 02/27/2022    ECHOCARDIOGRAM REPORT  Patient Name:   ANAIRIS KNICK Date of Exam: 02/27/2022 Medical Rec #:  [id number]          Height:       64.0 in Accession #:    [id number]         Weight:       103.0 lb Date of Birth:  10-Jun-1932           BSA:          1.476 m Patient Age:    44 years           BP:           148/90 mmHg Patient Gender: F                  HR:           83 bpm. Exam Location:  Inpatient Procedure: 2D Echo, Cardiac Doppler and Color Doppler Indications:    Stroke  History:        Patient has prior history of Echocardiogram examinations, most                 recent 05/04/2020. Risk  Factors:Hypertension, Dyslipidemia and                 Former Smoker. Lung cancer stg IV.  Sonographer:    Eartha Inch Referring Phys: [id number] DANIEL V THOMPSON  Sonographer Comments: Technically difficult study due to poor echo windows. Image acquisition challenging due to patient body habitus and Image acquisition challenging due to respiratory motion. IMPRESSIONS  1. Left ventricular ejection fraction, by estimation, is 65 to 70%. The left ventricle has normal function. The left ventricle has no regional wall motion abnormalities. There is mild concentric left ventricular hypertrophy. Left ventricular diastolic parameters are consistent with Grade I diastolic dysfunction (impaired relaxation).  2. Right ventricular systolic function is normal. The right ventricular size is normal. There is normal pulmonary artery systolic pressure.  3. Right atrial size was mildly dilated.  4. The mitral valve is normal in structure. No evidence of mitral valve regurgitation. No evidence of mitral stenosis.  5. The aortic valve is tricuspid. There is mild calcification of the aortic valve. Aortic valve regurgitation is not visualized. Aortic valve sclerosis/calcification is present, without any evidence of aortic stenosis.  6. The inferior vena cava is normal in size with greater than 50% respiratory variability, suggesting right atrial pressure of 3 mmHg. FINDINGS  Left Ventricle: Left ventricular ejection fraction, by estimation, is 65 to 70%. The left ventricle has normal function. The left ventricle has no regional wall motion abnormalities. The left ventricular internal cavity size was normal in size. There is  mild concentric left ventricular hypertrophy. Left ventricular diastolic parameters are consistent with Grade I diastolic dysfunction (impaired relaxation). Right Ventricle: The right ventricular size is normal. No increase in right ventricular wall thickness. Right ventricular systolic function is normal. There is  normal pulmonary artery systolic pressure. The tricuspid regurgitant velocity is 2.44 m/s, and  with an assumed right atrial pressure of 8 mmHg, the estimated right ventricular systolic pressure is 28.3 mmHg. Left Atrium: Left atrial size was normal in size. Right Atrium: Right atrial size was mildly dilated. Pericardium: There is no evidence of pericardial effusion. Mitral Valve: The mitral valve is normal in structure. Mild mitral annular calcification. No evidence of mitral valve regurgitation. No evidence of mitral valve stenosis. Tricuspid Valve: The tricuspid valve is normal in structure. Tricuspid valve regurgitation is mild . No  evidence of tricuspid stenosis. Aortic Valve: The aortic valve is tricuspid. There is mild calcification of the aortic valve. Aortic valve regurgitation is not visualized. Aortic valve sclerosis/calcification is present, without any evidence of aortic stenosis. Pulmonic Valve: The pulmonic valve was normal in structure. Pulmonic valve regurgitation is not visualized. No evidence of pulmonic stenosis. Aorta: The aortic root is normal in size and structure. Venous: The inferior vena cava is normal in size with greater than 50% respiratory variability, suggesting right atrial pressure of 3 mmHg. IAS/Shunts: No atrial level shunt detected by color flow Doppler.  LEFT VENTRICLE PLAX 2D LVIDd:         2.80 cm     Diastology LVIDs:         1.80 cm     LV e' medial:    5.02 cm/s LV PW:         1.00 cm     LV E/e' medial:  17.4 LV IVS:        1.00 cm     LV e' lateral:   3.30 cm/s LVOT diam:     1.80 cm     LV E/e' lateral: 26.5 LV SV:         82 LV SV Index:   56 LVOT Area:     2.54 cm  LV Volumes (MOD) LV vol d, MOD A2C: 44.7 ml LV vol d, MOD A4C: 37.5 ml LV vol s, MOD A2C: 10.6 ml LV vol s, MOD A4C: 8.8 ml LV SV MOD A2C:     34.1 ml LV SV MOD A4C:     37.5 ml LV SV MOD BP:      32.3 ml RIGHT VENTRICLE             IVC RV S prime:     14.90 cm/s  IVC diam: 2.10 cm TAPSE (M-mode): 2.5 cm  LEFT ATRIUM           Index        RIGHT ATRIUM           Index LA diam:      2.60 cm 1.76 cm/m   RA Area:     10.60 cm LA Vol (A2C): 44.5 ml 30.16 ml/m  RA Volume:   22.40 ml  15.18 ml/m LA Vol (A4C): 18.7 ml 12.67 ml/m  AORTIC VALVE LVOT Vmax:   137.00 cm/s LVOT Vmean:  100.000 cm/s LVOT VTI:    0.322 m  AORTA Ao Root diam: 2.70 cm Ao Asc diam:  2.90 cm MITRAL VALVE                TRICUSPID VALVE MV Area (PHT): 1.72 cm     TR Peak grad:   23.8 mmHg MV Decel Time: 440 msec     TR Mean grad:   16.0 mmHg MV E velocity: 87.30 cm/s   TR Vmax:        244.00 cm/s MV A velocity: 156.00 cm/s  TR Vmean:       197.0 cm/s MV E/A ratio:  0.56                             SHUNTS                             Systemic VTI:  0.32 m  Systemic Diam: 1.80 cm Glori Bickers MD Electronically signed by Glori Bickers MD Signature Date/Time: 02/27/2022/4:40:25 PM    Final    CT ANGIO HEAD NECK W WO CM  Result Date: 02/26/2022 CLINICAL DATA:  Weakness after receiving chemotherapy; small acute infarct in the left cerebellum on same-day MRI EXAM: CT ANGIOGRAPHY HEAD AND NECK TECHNIQUE: Multidetector CT imaging of the head and neck was performed using the standard protocol during bolus administration of intravenous contrast. Multiplanar CT image reconstructions and MIPs were obtained to evaluate the vascular anatomy. Carotid stenosis measurements (when applicable) are obtained utilizing NASCET criteria, using the distal internal carotid diameter as the denominator. RADIATION DOSE REDUCTION: This exam was performed according to the departmental dose-optimization program which includes automated exposure control, adjustment of the mA and/or kV according to patient size and/or use of iterative reconstruction technique. CONTRAST:  46mL OMNIPAQUE IOHEXOL 350 MG/ML SOLN COMPARISON:  No prior CT or CTA, correlation is made with 02/26/2022 MRI head and 01/28/2022 CT chest FINDINGS: CT HEAD FINDINGS Brain:  Linear hypodensity in the left cerebellum (series 14, image 7) correlates with the acute infarcts seen on the same-day MRI. No additional acute infarct. The known extra-axial mass along the right frontoparietal convexity is better seen on the same-day MRI (series 14, image 25). The known right posterior temporal lobe lesion is not appreciated on CT. No acute hemorrhage, mass effect, or midline shift. No hydrocephalus or extra-axial fluid collection. Vascular: No hyperdense vessel. Skull: Normal. Negative for fracture or focal lesion. Sinuses/Orbits: No acute finding. Status post bilateral lens replacements. Other: The mastoid air cells are well aerated. CTA NECK FINDINGS Aortic arch: Standard branching. Imaged portion shows no evidence of aneurysm or dissection. No significant stenosis of the major arch vessel origins. Right carotid system: No evidence of dissection, occlusion, or hemodynamically significant stenosis (greater than 50%). Left carotid system: No evidence of dissection, occlusion, or hemodynamically significant stenosis (greater than 50%). Vertebral arteries: No evidence of dissection, occlusion, or hemodynamically significant stenosis (greater than 50%). Skeleton: Redemonstrated sclerotic lesion in the left aspect T5, as seen on the 01/28/2022 study. A punctate lesion in the left aspect of T1 is also noted, unchanged. Hyperdense focus in the left lateral mass of C2 (series 8, image 180), bone island versus additional sclerotic lesion. Other neck: Negative. Upper chest: Scattered nodular opacities in the superior left lower lobe (series 6, image 1), which appears similar to 01/28/2022. No pleural effusion. Review of the MIP images confirms the above findings CTA HEAD FINDINGS Anterior circulation: Both internal carotid arteries are patent to the termini, without significant stenosis. A1 segments patent. Normal anterior communicating artery. Anterior cerebral arteries are patent to their distal aspects.  No M1 stenosis or occlusion. MCA branches perfused and symmetric. Posterior circulation: Vertebral arteries patent to the vertebrobasilar junction without stenosis. Posterior inferior cerebellar arteries patent proximally. Basilar patent to its distal aspect. Superior cerebellar arteries patent proximally. Patent P1 segments, diminutive on right. Near fetal origin of the right PCA from a prominent right posterior communicating artery. PCAs perfused to their distal aspects without stenosis. The left posterior communicating artery is not visualized. Venous sinuses: As permitted by contrast timing, patent. Anatomic variants: Near fetal origin of the right PCA. Review of the MIP images confirms the above findings IMPRESSION: 1. No intracranial large vessel occlusion or significant stenosis. 2. No hemodynamically significant stenosis in the neck. 3. Scattered nodular opacities in the superior left lower lobe, similar to 01/28/2022, which may be infectious or inflammatory. 4. Redemonstrated  sclerotic lesion in the left aspect of T5, as well as a punctate lesion in the left aspect of T1. Additional sclerotic focus in the left lateral mass of C2, concerning for an additional site of osseous metastatic disease. 5. Linear hypodensity in the left cerebellum correlates with the acute infarcts seen on the same-day MRI. No additional acute intracranial process. Electronically Signed   By: Merilyn Baba M.D.   On: 02/26/2022 18:48   MR Brain W and Wo Contrast  Result Date: 02/26/2022 CLINICAL DATA:  Brain/CNS neoplasm, assess treatment response EXAM: MRI HEAD WITHOUT AND WITH CONTRAST TECHNIQUE: Multiplanar, multiecho pulse sequences of the brain and surrounding structures were obtained without and with intravenous contrast. CONTRAST:  39mL GADAVIST GADOBUTROL 1 MMOL/ML IV SOLN COMPARISON:  MRI August 14, 2020. FINDINGS: Brain: Similar size of an extra-axial dural-based lesion along the right frontoparietal convexity, which  measures 1.4 cm on series 16, image 143. Similar versus slightly decreased conspicuity of a tiny lesion in the posterior temporal lobe (series 16, image 104) with faint susceptibility artifact (series 11, image 33). Otherwise, no abnormal enhancement. No new lesions identified. 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 Slight edema but no mass effect. No evidence of acute hemorrhage or midline shift. No hydrocephalus. Vascular: Major arterial flow voids are maintained at the skull base. Skull and upper cervical spine: Normal marrow signal. Sinuses/Orbits: Clear sinuses.  No acute orbital findings. Other: No mastoid effusions. IMPRESSION: 1. 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 Slight edema but no mass effect. 2. Similar versus slightly decreased conspicuity of a tiny treated lesion the posterior right temporal lobe. No progressive disease. 3. Unchanged extra-axial 1.4 cm lesion along the right chronic temporal convexity, probably a meningioma. Electronically Signed   By: Margaretha Sheffield M.D.   On: 02/26/2022 15:07   DG Chest Port 1 View  Result Date: 02/26/2022 CLINICAL DATA:  Generalized weakness EXAM: PORTABLE CHEST 1 VIEW COMPARISON:  06/30/2020 FINDINGS: Cardiac size is within normal limits. Low position of diaphragms suggests COPD. Small to moderate left pleural effusion is seen. There is interval removal of left chest tube. Increased interstitial markings are seen in left lower lung field. There is no focal consolidation. There is no significant right pleural effusion. There is no pneumothorax. IMPRESSION: Small to moderate left pleural effusion. Increased markings in left lower lung fields suggest possible interstitial pneumonia. Electronically Signed   By: Elmer Picker M.D.   On: 02/26/2022 14:14    Assessment/Plan  History of cerebellar stroke 02/26/22 small acute infarct in the left cerebellum on MRI, completed 3 weeks of DAPT, on ASA only, balance and gait  issue are improving slowly.   Orthostatic hypotension Controlled, on Midodrine.   Adenocarcinoma of left lung, stage 4 (HCC) Hx of a large left pleural effusion, s/p thoracentesis x2, drained 1.5L, Cytology showed adenocarcinoma stage IV lung cancer, CT showed left upper lobe mass invades the mediastinum at the level of the aortic or pulmonayr window, concerning for lymphangitic spread of disease,  f/u pulmonology Dr. Valeta Harms,  oncology Dr. Julien Nordmann, taking palliative chemotherapy/immunotherapy, on Tagrisso. CXR in this hospitalization showed small to moderate left pleural effusion. Decadron before and after chemo.  Anemia, chronic disease Hgb 10.8 03/21/22  Essential hypertension Blood pressure is controlled, on Metoprolol  Tachycardia heart rate is in control, on Metoprolol  Hyperlipidemia takes Atorvastatin, LDL 67 02/27/22  DOE (dyspnea on exertion)  SOB/mild valvular heart disease, Echo EF 65-70%  Hypophosphatemia  2.2 02/27/22  Hyponatremia  Na 132 03/21/22  Edema  Edema BLE, off Spironolactone.     Family/ staff Communication: plan of care reviewed with the patient and charge nurse.   Labs/tests ordered:  none  Time spend 35 minutes.

## 2022-03-22 NOTE — Progress Notes (Unsigned)
Location:  [facility] Room Number: Room 54A Place of Service:  SNF (31) Provider:  Wasim Hurlbut X, NP     Patient Care Team: Mahlon Gammon, MD as PCP - General (Internal Medicine) Rennis Golden Lisette Abu, MD as PCP - Cardiology (Cardiology) Syliva Overman, RN as Oncology Nurse Navigator (Oncology)  Extended Emergency Contact Information Primary Emergency Contact: Smithwick,Pat Home Phone: [phone] Mobile Phone: [phone] Relation: Friend Secondary Emergency Contact: Janine Limbo Address: [address]          [place], [place] [address] [place] Home Phone: [phone] Work Phone: [phone] Relation: Other  Code Status: DNR Goals of care: Advanced Directive information    03/22/2022    3:07 PM  Advanced Directives  Does Patient Have a Medical Advance Directive? Yes  Type of Estate agent of [place];Living will  Does patient want to make changes to medical advance directive? No - Patient declined  Copy of Healthcare Power of Attorney in Chart? Yes - validated most recent copy scanned in chart (See row information)     Chief Complaint  Patient presents with   Medical Management of Chronic Issues    Patient is here for a follow up for chronic conditions    Quality Metric Gaps    Needs to Discuss Update Vaccine    HPI:  [name] seen today for medical management of chronic diseases.     Past Medical History:  Diagnosis Date   Bruit    Abdominal bruit - Abdominal aorta/renal duplex Doppler evaluation 12/07/03 -Mildly abnormal evaluation. 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 This is consistant w/median arcuate ligament compression syndrome. *Right & Left Kidney: Essentially equal in size, symmetrical in shape w/no significant abnormalities visualized. *Right & Left Renal Arteries: No significant abnormalities.   Cancer (HCC)    Edema extremities    LE edema    H/O  myocardial perfusion scan 02/20/00   To rule out ischemia - Negative adequate Bruce protocol exercise stress test with a deconditioned exercise response and normal static myocardial perfusion images with EF calculated by QGS of 77%. Represents a low risk study.   Hyperlipidemia    Hypertension    Osteoarthritis    Osteoporosis    Pleural effusion 04/2020   Valvular heart disease    Mild valvular heart disease by Echo in 2010, all mild and symptomatic, including concentric LVH, MR, TR, and AI with pulmonary artery pressure of 32. EF was normal.   Past Surgical History:  Procedure Laterality Date   Abdominal aorta/Renal duplex Doppler Evaluation  12/07/03   For abdominal bruit. Mildly abnormal evaluation. (See bruit in medical history)   BREAST EXCISIONAL BIOPSY Left 1966   BREAST EXCISIONAL BIOPSY Left 1999   CATARACT EXTRACTION  2003   CHEST TUBE INSERTION N/A 07/04/2020   Procedure: REMOVAL PLEURAL DRAINAGE CATHETER;  Surgeon: Josephine Igo, DO;  Location: [facility];  Service: Pulmonary;  Laterality: N/A;   COLONOSCOPY  10/22/2004   DEXA Bone Scan  06/23/2013   IR PERC PLEURAL DRAIN W/INDWELL CATH W/IMG GUIDE  05/10/2020   IR THORACENTESIS ASP PLEURAL SPACE W/IMG GUIDE  05/04/2020    Allergies  Allergen Reactions   Irbesartan Nausea Only    Outpatient Encounter Medications as of 03/22/2022  Medication Sig   acetaminophen (TYLENOL) 325 MG tablet Take 2 tablets (650 mg total) by mouth every 6 (six) hours as needed for mild pain (or Fever >/= 101).  aspirin 81 MG tablet Take 81 mg by mouth daily.   atorvastatin (LIPITOR) 20 MG tablet TAKE 1 TABLET BY MOUTH EVERYDAY AT BEDTIME   bisacodyl (DULCOLAX) 5 MG EC tablet Take 5 mg by mouth daily as needed for moderate constipation.   clopidogrel (PLAVIX) 75 MG tablet Take 75 mg by mouth daily.   dexamethasone (DECADRON) 4 MG tablet Please take 1 tablet twice a day the day before, the day of, and the day after chemotherapy   folic acid  (FOLVITE) 1 MG tablet Take 1 tablet (1 mg total) by mouth daily.   IVIZIA DRY EYES 0.5 % SOLN Place 1 drop into both eyes in the morning.   magnesium hydroxide (MILK OF MAGNESIA) 400 MG/5ML suspension Take 15 mLs by mouth daily as needed for mild constipation.   metoprolol tartrate (LOPRESSOR) 25 MG tablet Take 12.5 mg by mouth 2 (two) times daily.   midodrine (PROAMATINE) 2.5 MG tablet Take 1 tablet (2.5 mg total) by mouth 2 (two) times daily as needed (if sbp <100 on standing.).   osimertinib mesylate (TAGRISSO) 40 MG tablet Take 1 tablet (40 mg total) by mouth daily.   prochlorperazine (COMPAZINE) 10 MG tablet Take 1 tablet (10 mg total) by mouth every 6 (six) hours as needed.   No facility-administered encounter medications on file as of 03/22/2022.    Review of Systems  Immunization History  Administered Date(s) Administered   Fluad Quad(high Dose 65+) 01/06/2019, 02/08/2020, 02/27/2022   Influenza Split 08/14/2010, 01/16/2012, 02/02/2013, 01/24/2015, 02/01/2016, 02/19/2017, 01/28/2018   Influenza, High Dose Seasonal PF 01/24/2015, 02/01/2016, 01/28/2018, 02/15/2021   Influenza-Unspecified 02/01/2016   MODERNA COVID-19 SARS-COV-2 PEDS BIVALENT BOOSTER 6Y-11Y 10/24/2021   Moderna Covid-19 Vaccine Bivalent Booster 38yrs & up 03/14/2022   Moderna SARS-COV2 Booster Vaccination 10/18/2020   Moderna Sars-Covid-2 Vaccination 06/14/2019, 03/27/2020, 03/27/2020   PFIZER(Purple Top)SARS-COV-2 Vaccination 01/31/2021   Pneumococcal Conjugate-13 08/11/2013, 09/03/2013   Pneumococcal Polysaccharide-23 05/28/2004   Td 07/04/2000   Tdap 07/04/2009, 10/18/2019   Unspecified SARS-COV-2 Vaccination 03/27/2020   Zoster, Live 07/02/2005   Pertinent  Health Maintenance Due  Topic Date Due   INFLUENZA VACCINE  Completed   DEXA SCAN  Completed      03/01/2022    3:28 PM 03/01/2022   10:09 PM 03/02/2022    7:35 AM 03/13/2022   11:59 AM 03/22/2022    3:07 PM  Fall Risk  Falls in the past year?  0    0  Was there an injury with Fall? 0    0  Fall Risk Category Calculator 0    0  Fall Risk Category Low    Low  Patient Fall Risk Level High fall risk High fall risk Moderate fall risk High fall risk High fall risk  Patient at Risk for Falls Due to History of fall(s);Impaired balance/gait;Impaired mobility    History of fall(s);Impaired balance/gait;Impaired mobility  Fall risk Follow up Falls evaluation completed    Falls evaluation completed   Functional Status Survey:    Vitals:   03/22/22 1500  BP: (!) 142/72  Pulse: 78  Resp: 18  Temp: (!) 97.4 F (36.3 C)  SpO2: 98%  Weight: 101 lb 1 oz (45.8 kg)  Height: 5\' 4"  (1.626 m)   Body mass index is 17.35 kg/m. Physical Exam  Labs reviewed: Recent Labs    02/26/22 1351 02/26/22 1722 02/27/22 0430 03/13/22 1102  NA 130*  --  133* 134*  K 5.3* 4.8 4.8 4.2  CL 103  --  108 103  CO2 22  --  20* 25  GLUCOSE 116*  --  94 144*  BUN 41*  --  25* 16  CREATININE 1.05*  --  0.81 0.90  CALCIUM 9.8  --  9.3 10.3  MG  --   --  2.0  --   PHOS  --   --  2.2*  --    Recent Labs    02/26/22 1351 02/27/22 0430 03/13/22 1102  AST 25 25 20   ALT 18 18 29   ALKPHOS 55 56 88  BILITOT 0.8 0.7 0.3  PROT 5.8* 5.5* 6.3*  ALBUMIN 3.3* 3.0* 3.7   Recent Labs    02/26/22 1351 02/27/22 0430 03/13/22 1102  WBC 8.1 6.5 7.2  NEUTROABS 7.4 5.1 5.6  HGB 12.7 12.7 11.8*  HCT 37.7 38.1 33.9*  MCV 91.5 91.1 90.2  PLT 149* 132* 450*   Lab Results  Component Value Date   TSH 2.294 03/13/2022   Lab Results  Component Value Date   HGBA1C 5.7 (H) 02/26/2022   Lab Results  Component Value Date   CHOL 143 02/27/2022   HDL 59 02/27/2022   LDLCALC 67 02/27/2022   TRIG 83 02/27/2022   CHOLHDL 2.4 02/27/2022    Significant Diagnostic Results in last 30 days:  DG Chest 2 View  Result Date: 03/01/2022 CLINICAL DATA:  Pleural effusions.  History of lung cancer. EXAM: CHEST - 2 VIEW COMPARISON:  Chest radiograph dated 02/26/2022.  FINDINGS: Bilateral pleural effusions, left greater than right relatively similar to prior radiograph. Left mid to lower lung field reticulonodular interstitial densities again noted. Left perihilar density there evaluated on the CT. Small linear lucency along the left upper mediastinum to the left of the trachea, likely artifactual. A small left pneumothorax is less likely. Attention on follow-up imaging recommended. The cardiac silhouette is within limits. Atherosclerotic calcification of the aorta. Degenerative changes of the spine. No acute osseous pathology. IMPRESSION: Similar appearance of the lungs with small bilateral pleural effusions. Electronically Signed   By: Elgie Collard M.D.   On: 03/01/2022 19:09   VAS [place] LOWER EXTREMITY VENOUS (DVT)  Result Date: 02/27/2022  Lower Venous DVT Study Patient Name:  Shelley Flores  Date of Exam:   02/27/2022 Medical Rec #: [id number]           Accession #:    [id number] Date of Birth: 11-11-1932            Patient Gender: F Patient Age:   88 years Exam [facility]:  [facility] Procedure:      VAS [place] LOWER EXTREMITY VENOUS (DVT) Referring Phys: MCNEILL KIRKPATRICK --------------------------------------------------------------------------------  Indications: Stroke, and "leg stiffness".  Risk Factors: Chemotherapy. Comparison Study: Previous exam on 05/04/20 was negative for DVT Performing Technologist: Ernestene Mention RVT, RDMS  Examination Guidelines: A complete evaluation includes B-mode imaging, spectral Doppler, color Doppler, and power Doppler as needed of all accessible portions of each vessel. Bilateral testing is considered an integral part of a complete examination. Limited examinations for reoccurring indications may be performed as noted. The reflux portion of the exam is performed with the patient in reverse Trendelenburg.  +---------+---------------+---------+-----------+----------+--------------+ RIGHT     CompressibilityPhasicitySpontaneityPropertiesThrombus Aging +---------+---------------+---------+-----------+----------+--------------+ CFV      Full           Yes      Yes                                 +---------+---------------+---------+-----------+----------+--------------+  SFJ      Full                                                        +---------+---------------+---------+-----------+----------+--------------+ FV Prox  Full           Yes      Yes                                 +---------+---------------+---------+-----------+----------+--------------+ FV Mid   Full           Yes      Yes                                 +---------+---------------+---------+-----------+----------+--------------+ FV DistalFull           Yes      Yes                                 +---------+---------------+---------+-----------+----------+--------------+ PFV      Full                                                        +---------+---------------+---------+-----------+----------+--------------+ POP      Full           Yes      Yes                                 +---------+---------------+---------+-----------+----------+--------------+ PTV      Full                                                        +---------+---------------+---------+-----------+----------+--------------+ PERO     Full                                                        +---------+---------------+---------+-----------+----------+--------------+   +---------+---------------+---------+-----------+----------+--------------+ LEFT     CompressibilityPhasicitySpontaneityPropertiesThrombus Aging +---------+---------------+---------+-----------+----------+--------------+ CFV      Full           Yes      Yes                                 +---------+---------------+---------+-----------+----------+--------------+ SFJ      Full                                                         +---------+---------------+---------+-----------+----------+--------------+  FV Prox  Full           Yes      Yes                                 +---------+---------------+---------+-----------+----------+--------------+ FV Mid   Full           Yes      Yes                                 +---------+---------------+---------+-----------+----------+--------------+ FV DistalFull           Yes      Yes                                 +---------+---------------+---------+-----------+----------+--------------+ PFV      Full                                                        +---------+---------------+---------+-----------+----------+--------------+ POP      Full           Yes      Yes                                 +---------+---------------+---------+-----------+----------+--------------+ PTV      Full                                                        +---------+---------------+---------+-----------+----------+--------------+ PERO     Full                                                        +---------+---------------+---------+-----------+----------+--------------+     Summary: BILATERAL: - No evidence of deep vein thrombosis seen in the lower extremities, bilaterally. -No evidence of popliteal cyst, bilaterally.   *See table(s) above for measurements and observations. Electronically signed by Coral Else MD on 02/27/2022 at 11:50:36 PM.    Final    ECHOCARDIOGRAM COMPLETE  Result Date: 02/27/2022    ECHOCARDIOGRAM REPORT   Patient Name:   RAQUAL MICHALS Date of Exam: 02/27/2022 Medical Rec #:  [id number]          Height:       64.0 in Accession #:    [id number]         Weight:       103.0 lb Date of Birth:  1933-05-12           BSA:          1.476 m Patient Age:    89 years           BP:           148/90 mmHg Patient Gender: F  HR:           83 bpm. Exam Location:  Inpatient Procedure: 2D Echo, Cardiac Doppler and  Color Doppler Indications:    Stroke  History:        Patient has prior history of Echocardiogram examinations, most                 recent 05/04/2020. Risk Factors:Hypertension, Dyslipidemia and                 Former Smoker. Lung cancer stg IV.  Sonographer:    Milda Smart Referring Phys: [id number] DANIEL V THOMPSON  Sonographer Comments: Technically difficult study due to poor echo windows. Image acquisition challenging due to patient body habitus and Image acquisition challenging due to respiratory motion. IMPRESSIONS  1. Left ventricular ejection fraction, by estimation, is 65 to 70%. The left ventricle has normal function. The left ventricle has no regional wall motion abnormalities. There is mild concentric left ventricular hypertrophy. Left ventricular diastolic parameters are consistent with Grade I diastolic dysfunction (impaired relaxation).  2. Right ventricular systolic function is normal. The right ventricular size is normal. There is normal pulmonary artery systolic pressure.  3. Right atrial size was mildly dilated.  4. The mitral valve is normal in structure. No evidence of mitral valve regurgitation. No evidence of mitral stenosis.  5. The aortic valve is tricuspid. There is mild calcification of the aortic valve. Aortic valve regurgitation is not visualized. Aortic valve sclerosis/calcification is present, without any evidence of aortic stenosis.  6. The inferior vena cava is normal in size with greater than 50% respiratory variability, suggesting right atrial pressure of 3 mmHg. FINDINGS  Left Ventricle: Left ventricular ejection fraction, by estimation, is 65 to 70%. The left ventricle has normal function. The left ventricle has no regional wall motion abnormalities. The left ventricular internal cavity size was normal in size. There is  mild concentric left ventricular hypertrophy. Left ventricular diastolic parameters are consistent with Grade I diastolic dysfunction (impaired relaxation).  Right Ventricle: The right ventricular size is normal. No increase in right ventricular wall thickness. Right ventricular systolic function is normal. There is normal pulmonary artery systolic pressure. The tricuspid regurgitant velocity is 2.44 m/s, and  with an assumed right atrial pressure of 8 mmHg, the estimated right ventricular systolic pressure is 31.8 mmHg. Left Atrium: Left atrial size was normal in size. Right Atrium: Right atrial size was mildly dilated. Pericardium: There is no evidence of pericardial effusion. Mitral Valve: The mitral valve is normal in structure. Mild mitral annular calcification. No evidence of mitral valve regurgitation. No evidence of mitral valve stenosis. Tricuspid Valve: The tricuspid valve is normal in structure. Tricuspid valve regurgitation is mild . No evidence of tricuspid stenosis. Aortic Valve: The aortic valve is tricuspid. There is mild calcification of the aortic valve. Aortic valve regurgitation is not visualized. Aortic valve sclerosis/calcification is present, without any evidence of aortic stenosis. Pulmonic Valve: The pulmonic valve was normal in structure. Pulmonic valve regurgitation is not visualized. No evidence of pulmonic stenosis. Aorta: The aortic root is normal in size and structure. Venous: The inferior vena cava is normal in size with greater than 50% respiratory variability, suggesting right atrial pressure of 3 mmHg. IAS/Shunts: No atrial level shunt detected by color flow Doppler.  LEFT VENTRICLE PLAX 2D LVIDd:         2.80 cm     Diastology LVIDs:         1.80 cm  LV e' medial:    5.02 cm/s LV PW:         1.00 cm     LV E/e' medial:  17.4 LV IVS:        1.00 cm     LV e' lateral:   3.30 cm/s LVOT diam:     1.80 cm     LV E/e' lateral: 26.5 LV SV:         82 LV SV Index:   56 LVOT Area:     2.54 cm  LV Volumes (MOD) LV vol d, MOD A2C: 44.7 ml LV vol d, MOD A4C: 37.5 ml LV vol s, MOD A2C: 10.6 ml LV vol s, MOD A4C: 8.8 ml LV SV MOD A2C:     34.1  ml LV SV MOD A4C:     37.5 ml LV SV MOD BP:      32.3 ml RIGHT VENTRICLE             IVC RV S prime:     14.90 cm/s  IVC diam: 2.10 cm TAPSE (M-mode): 2.5 cm LEFT ATRIUM           Index        RIGHT ATRIUM           Index LA diam:      2.60 cm 1.76 cm/m   RA Area:     10.60 cm LA Vol (A2C): 44.5 ml 30.16 ml/m  RA Volume:   22.40 ml  15.18 ml/m LA Vol (A4C): 18.7 ml 12.67 ml/m  AORTIC VALVE LVOT Vmax:   137.00 cm/s LVOT Vmean:  100.000 cm/s LVOT VTI:    0.322 m  AORTA Ao Root diam: 2.70 cm Ao Asc diam:  2.90 cm MITRAL VALVE                TRICUSPID VALVE MV Area (PHT): 1.72 cm     TR Peak grad:   23.8 mmHg MV Decel Time: 440 msec     TR Mean grad:   16.0 mmHg MV E velocity: 87.30 cm/s   TR Vmax:        244.00 cm/s MV A velocity: 156.00 cm/s  TR Vmean:       197.0 cm/s MV E/A ratio:  0.56                             SHUNTS                             Systemic VTI:  0.32 m                             Systemic Diam: 1.80 cm Arvilla Meres MD Electronically signed by Arvilla Meres MD Signature Date/Time: 02/27/2022/4:40:25 PM    Final    CT ANGIO HEAD NECK W WO CM  Result Date: 02/26/2022 CLINICAL DATA:  Weakness after receiving chemotherapy; small acute infarct in the left cerebellum on same-day MRI EXAM: CT ANGIOGRAPHY HEAD AND NECK TECHNIQUE: Multidetector CT imaging of the head and neck was performed using the standard protocol during bolus administration of intravenous contrast. Multiplanar CT image reconstructions and MIPs were obtained to evaluate the vascular anatomy. Carotid stenosis measurements (when applicable) are obtained utilizing NASCET criteria, using the distal internal carotid diameter as the denominator. RADIATION DOSE REDUCTION: This exam was performed according  to the departmental dose-optimization program which includes automated exposure control, adjustment of the mA and/or kV according to patient size and/or use of iterative reconstruction technique. CONTRAST:  75mL OMNIPAQUE  IOHEXOL 350 MG/ML SOLN COMPARISON:  No prior CT or CTA, correlation is made with 02/26/2022 MRI head and 01/28/2022 CT chest FINDINGS: CT HEAD FINDINGS Brain: Linear hypodensity in the left cerebellum (series 14, image 7) correlates with the acute infarcts seen on the same-day MRI. No additional acute infarct. The known extra-axial mass along the right frontoparietal convexity is better seen on the same-day MRI (series 14, image 25). The known right posterior temporal lobe lesion is not appreciated on CT. No acute hemorrhage, mass effect, or midline shift. No hydrocephalus or extra-axial fluid collection. Vascular: No hyperdense vessel. Skull: Normal. Negative for fracture or focal lesion. Sinuses/Orbits: No acute finding. Status post bilateral lens replacements. Other: The mastoid air cells are well aerated. CTA NECK FINDINGS Aortic arch: Standard branching. Imaged portion shows no evidence of aneurysm or dissection. No significant stenosis of the major arch vessel origins. Right carotid system: No evidence of dissection, occlusion, or hemodynamically significant stenosis (greater than 50%). Left carotid system: No evidence of dissection, occlusion, or hemodynamically significant stenosis (greater than 50%). Vertebral arteries: No evidence of dissection, occlusion, or hemodynamically significant stenosis (greater than 50%). Skeleton: Redemonstrated sclerotic lesion in the left aspect T5, as seen on the 01/28/2022 study. A punctate lesion in the left aspect of T1 is also noted, unchanged. Hyperdense focus in the left lateral mass of C2 (series 8, image 180), bone island versus additional sclerotic lesion. Other neck: Negative. Upper chest: Scattered nodular opacities in the superior left lower lobe (series 6, image 1), which appears similar to 01/28/2022. No pleural effusion. Review of the MIP images confirms the above findings CTA HEAD FINDINGS Anterior circulation: Both internal carotid arteries are patent to the  termini, without significant stenosis. A1 segments patent. Normal anterior communicating artery. Anterior cerebral arteries are patent to their distal aspects. No M1 stenosis or occlusion. MCA branches perfused and symmetric. Posterior circulation: Vertebral arteries patent to the vertebrobasilar junction without stenosis. Posterior inferior cerebellar arteries patent proximally. Basilar patent to its distal aspect. Superior cerebellar arteries patent proximally. Patent P1 segments, diminutive on right. Near fetal origin of the right PCA from a prominent right posterior communicating artery. PCAs perfused to their distal aspects without stenosis. The left posterior communicating artery is not visualized. Venous sinuses: As permitted by contrast timing, patent. Anatomic variants: Near fetal origin of the right PCA. Review of the MIP images confirms the above findings IMPRESSION: 1. No intracranial large vessel occlusion or significant stenosis. 2. No hemodynamically significant stenosis in the neck. 3. Scattered nodular opacities in the superior left lower lobe, similar to 01/28/2022, which may be infectious or inflammatory. 4. Redemonstrated sclerotic lesion in the left aspect of T5, as well as a punctate lesion in the left aspect of T1. Additional sclerotic focus in the left lateral mass of C2, concerning for an additional site of osseous metastatic disease. 5. Linear hypodensity in the left cerebellum correlates with the acute infarcts seen on the same-day MRI. No additional acute intracranial process. Electronically Signed   By: Wiliam Ke M.D.   On: 02/26/2022 18:48   MR Brain W and Wo Contrast  Result Date: 02/26/2022 CLINICAL DATA:  Brain/CNS neoplasm, assess treatment response EXAM: MRI HEAD WITHOUT AND WITH CONTRAST TECHNIQUE: Multiplanar, multiecho pulse sequences of the brain and surrounding structures were obtained without and with intravenous  contrast. CONTRAST:  5mL GADAVIST GADOBUTROL 1 MMOL/ML  IV SOLN COMPARISON:  MRI August 14, 2020. FINDINGS: Brain: Similar size of an extra-axial dural-based lesion along the right frontoparietal convexity, which measures 1.4 cm on series 16, image 143. Similar versus slightly decreased conspicuity of a tiny lesion in the posterior temporal lobe (series 16, image 104) with faint susceptibility artifact (series 11, image 33). Otherwise, no abnormal enhancement. No new lesions identified. 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 Slight edema but no mass effect. No evidence of acute hemorrhage or midline shift. No hydrocephalus. Vascular: Major arterial flow voids are maintained at the skull base. Skull and upper cervical spine: Normal marrow signal. Sinuses/Orbits: Clear sinuses.  No acute orbital findings. Other: No mastoid effusions. IMPRESSION: 1. 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 Slight edema but no mass effect. 2. Similar versus slightly decreased conspicuity of a tiny treated lesion the posterior right temporal lobe. No progressive disease. 3. Unchanged extra-axial 1.4 cm lesion along the right chronic temporal convexity, probably a meningioma. Electronically Signed   By: Feliberto Harts M.D.   On: 02/26/2022 15:07   DG Chest Port 1 View  Result Date: 02/26/2022 CLINICAL DATA:  Generalized weakness EXAM: PORTABLE CHEST 1 VIEW COMPARISON:  06/30/2020 FINDINGS: Cardiac size is within normal limits. Low position of diaphragms suggests COPD. Small to moderate left pleural effusion is seen. There is interval removal of left chest tube. Increased interstitial markings are seen in left lower lung field. There is no focal consolidation. There is no significant right pleural effusion. There is no pneumothorax. IMPRESSION: Small to moderate left pleural effusion. Increased markings in left lower lung fields suggest possible interstitial pneumonia. Electronically Signed   By: Ernie Avena M.D.   On: 02/26/2022 14:14     Assessment/Plan There are no diagnoses linked to this encounter.   Family/ staff Communication: ***  Labs/tests ordered:  ***

## 2022-03-25 ENCOUNTER — Encounter: Payer: Self-pay | Admitting: Internal Medicine

## 2022-03-25 NOTE — Progress Notes (Unsigned)
This encounter was created in error - please disregard.

## 2022-03-26 ENCOUNTER — Non-Acute Institutional Stay (SKILLED_NURSING_FACILITY): Payer: Medicare PPO | Admitting: Nurse Practitioner

## 2022-03-26 ENCOUNTER — Encounter: Payer: Self-pay | Admitting: Nurse Practitioner

## 2022-03-26 ENCOUNTER — Other Ambulatory Visit (HOSPITAL_COMMUNITY): Payer: Self-pay

## 2022-03-26 DIAGNOSIS — R0609 Other forms of dyspnea: Secondary | ICD-10-CM | POA: Diagnosis not present

## 2022-03-26 DIAGNOSIS — D638 Anemia in other chronic diseases classified elsewhere: Secondary | ICD-10-CM

## 2022-03-26 DIAGNOSIS — I951 Orthostatic hypotension: Secondary | ICD-10-CM

## 2022-03-26 DIAGNOSIS — C3412 Malignant neoplasm of upper lobe, left bronchus or lung: Secondary | ICD-10-CM | POA: Diagnosis not present

## 2022-03-26 DIAGNOSIS — Z8673 Personal history of transient ischemic attack (TIA), and cerebral infarction without residual deficits: Secondary | ICD-10-CM | POA: Diagnosis not present

## 2022-03-26 DIAGNOSIS — E871 Hypo-osmolality and hyponatremia: Secondary | ICD-10-CM

## 2022-03-26 DIAGNOSIS — R000 Tachycardia, unspecified: Secondary | ICD-10-CM | POA: Diagnosis not present

## 2022-03-26 DIAGNOSIS — E782 Mixed hyperlipidemia: Secondary | ICD-10-CM | POA: Diagnosis not present

## 2022-03-26 DIAGNOSIS — R609 Edema, unspecified: Secondary | ICD-10-CM

## 2022-03-26 NOTE — Assessment & Plan Note (Signed)
akes Atorvastatin, LDL 67 02/27/22

## 2022-03-26 NOTE — Assessment & Plan Note (Signed)
Hgb 10.8 03/21/22

## 2022-03-26 NOTE — Assessment & Plan Note (Signed)
Orthostatic hypotension, controlled on Midodrine. The patient has been feeling groggy even if prior to the stroke, worsened after getting up/moving about, not dizziness or lightheadedness in nature, no change vision, no associated with headache or focal weakness.

## 2022-03-26 NOTE — Assessment & Plan Note (Signed)
2.2 02/27/22 

## 2022-03-26 NOTE — Assessment & Plan Note (Signed)
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 upcoming neurological appointment in 2-3 weeks.

## 2022-03-26 NOTE — Progress Notes (Signed)
This encounter was created in error - please disregard.

## 2022-03-26 NOTE — Progress Notes (Addendum)
Location:  [place] Room Number: NO/54/A Place of Service:  SNF (31) Provider:  Marcos Ruelas X, NP  Patient Care Team: [name], MD as PCP - General (Internal Medicine) Debara Pickett Nadean Corwin, MD as PCP - Cardiology (Cardiology) Valrie Hart, RN as Oncology Nurse Navigator (Oncology)  Extended Emergency Contact Information Primary Emergency Contact: Smithwick,Pat Home Phone: [phone] Mobile Phone: [phone] Relation: Friend Secondary Emergency Contact: Genice Rouge Address: [place], [place] [address] [place] Phone: [phone] Work Phone: [phone] Relation: Other  Code Status:  DNR Goals of care: Advanced Directive information    03/26/2022    2:18 PM  Advanced Directives  Does Patient Have a Medical Advance Directive? Yes  Type of Paramedic of [place];Living will  Does patient want to make changes to medical advance directive? No - Patient declined  Copy of [place] in Chart? Yes - validated most recent copy scanned in chart (See row information)     Chief Complaint  Patient presents with   Acute Visit    Patient is being discharged to [facility]    HPI:  [name] with PMH significant of stage IV lung cancer, hypotension, anemia, HTN, tachycardia, HLD, and recent stroke was admitted to [facility] for therapy.   The patient has regained her physical strength, improved balance, gait, and ADL function. She is stable to transition to [place] for continue therapy.  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 upcoming neurological appointment in 2-3 weeks.              Orthostatic hypotension, on Midodrine. The patient has been feeling groggy even if prior to the stroke, worsened after getting up/moving about, not  dizziness or lightheadedness in nature, no change vision, no associated with headache or focal weakness.               Hx of a large left pleural effusion, s/p thoracentesis x2, drained 1.5L, Cytology showed adenocarcinoma stage IV lung cancer, CT showed left upper lobe mass invades the mediastinum at the level of the aortic or pulmonayr window, concerning for lymphangitic spread of disease,  f/u pulmonology Dr. Valeta Harms,  oncology Dr. Julien Nordmann, taking palliative chemotherapy/immunotherapy, on Tagrisso. CXR in this hospitalization showed small to moderate left pleural effusion. Decadron before and after chemo.             Anemia, Hgb 10.8 03/21/22             Tachycardia, heart rate is in control, on Metoprolol             Hyperlipidemia, takes Atorvastatin, LDL 67 02/27/22             SOB/mild valvular heart disease, Echo EF 65-70%             Hypophosphatemia, 2.2 02/27/22             Hyponatremia, Na 132 03/21/22             Edema BLE, off Spironolactone.           Past Medical History:  Diagnosis Date   Bruit    Abdominal bruit - Abdominal aorta/renal duplex Doppler evaluation 12/07/03 -Mildly abnormal evaluation. 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  This is consistant w/median arcuate ligament compression syndrome. *Right & Left Kidney: Essentially equal in size, symmetrical in shape w/no significant abnormalities visualized. *Right & Left Renal Arteries: No significant abnormalities.   Cancer ([place])    Edema extremities    LE edema    H/O myocardial perfusion scan 02/20/00   To rule out ischemia - Negative adequate Bruce protocol exercise stress test with a deconditioned exercise response and normal static myocardial perfusion images with EF calculated by QGS of 77%. Represents a low risk study.   Hyperlipidemia    Hypertension    Osteoarthritis    Osteoporosis    Pleural effusion 04/2020   Valvular heart disease    Mild valvular heart disease by Echo in 2010, all mild and  symptomatic, including concentric LVH, MR, TR, and AI with pulmonary artery pressure of 32. EF was normal.   Past Surgical History:  Procedure Laterality Date   Abdominal aorta/Renal duplex Doppler Evaluation  12/07/03   For abdominal bruit. Mildly abnormal evaluation. (See bruit in medical history)   BREAST EXCISIONAL BIOPSY Left 1966   BREAST EXCISIONAL BIOPSY Left 1999   CATARACT EXTRACTION  2003   CHEST TUBE INSERTION N/A 07/04/2020   Procedure: REMOVAL PLEURAL DRAINAGE CATHETER;  Surgeon: Garner Nash, DO;  Location: [place];  Service: Pulmonary;  Laterality: N/A;   COLONOSCOPY  10/22/2004   DEXA Bone Scan  06/23/2013   IR PERC PLEURAL DRAIN W/INDWELL CATH W/IMG GUIDE  05/10/2020   IR THORACENTESIS ASP PLEURAL SPACE W/IMG GUIDE  05/04/2020    Allergies  Allergen Reactions   Irbesartan Nausea Only    Outpatient Encounter Medications as of 03/26/2022  Medication Sig   acetaminophen (TYLENOL) 325 MG tablet Take 2 tablets (650 mg total) by mouth every 6 (six) hours as needed for mild pain (or Fever >/= 101).   aspirin 81 MG tablet Take 81 mg by mouth daily.   atorvastatin (LIPITOR) 20 MG tablet TAKE 1 TABLET BY MOUTH EVERYDAY AT BEDTIME   bisacodyl (DULCOLAX) 5 MG EC tablet Take 5 mg by mouth daily as needed for moderate constipation.   clopidogrel (PLAVIX) 75 MG tablet Take 75 mg by mouth daily.   dexamethasone (DECADRON) 4 MG tablet Please take 1 tablet twice a day the day before, the day of, and the day after chemotherapy   folic acid (FOLVITE) 1 MG tablet Take 1 tablet (1 mg total) by mouth daily.   IVIZIA DRY EYES 0.5 % SOLN Place 1 drop into both eyes in the morning.   magnesium hydroxide (MILK OF MAGNESIA) 400 MG/5ML suspension Take 15 mLs by mouth daily as needed for mild constipation.   metoprolol tartrate (LOPRESSOR) 25 MG tablet Take 12.5 mg by mouth 2 (two) times daily.   midodrine (PROAMATINE) 2.5 MG tablet Take 1 tablet (2.5 mg total) by mouth 2 (two) times daily  as needed (if sbp <100 on standing.).   osimertinib mesylate (TAGRISSO) 40 MG tablet Take 1 tablet (40 mg total) by mouth daily.   prochlorperazine (COMPAZINE) 10 MG tablet Take 1 tablet (10 mg total) by mouth every 6 (six) hours as needed.   No facility-administered encounter medications on file as of 03/26/2022.    Review of Systems  Constitutional:  Negative for appetite change, fatigue and fever.       Persisted poor appetite and fatigue.   HENT:  Positive for hearing loss. Negative for congestion and trouble swallowing.   Eyes:  Negative for visual disturbance.  Respiratory:  Positive for shortness of breath. Negative for cough and wheezing.   Cardiovascular:  Positive for leg swelling. Negative for chest pain and palpitations.  Gastrointestinal:  Negative for abdominal pain and constipation.       Poor appetite, early satiety.   Genitourinary:  Negative for dysuria, hematuria and urgency.  Musculoskeletal:  Positive for gait problem.  Skin:  Negative for color change.  Neurological:  Negative for speech difficulty, weakness, light-headedness and headaches.       Groggy feeling worsens when she is up and moving about  Psychiatric/Behavioral:  Negative for behavioral problems and sleep disturbance. The patient is not nervous/anxious.     Immunization History  Administered Date(s) Administered   Fluad Quad(high Dose 65+) 01/06/2019, 02/08/2020, 02/27/2022   Influenza Split 08/14/2010, 01/16/2012, 02/02/2013, 01/24/2015, 02/01/2016, 02/19/2017, 01/28/2018   Influenza, High Dose Seasonal PF 01/24/2015, 02/01/2016, 01/28/2018, 02/15/2021   Influenza-Unspecified 02/01/2016   MODERNA COVID-19 SARS-COV-2 PEDS BIVALENT BOOSTER 6Y-11Y 10/24/2021   Moderna Covid-19 Vaccine Bivalent Booster 16yrs & up 03/14/2022   Moderna SARS-COV2 Booster Vaccination 10/18/2020   Moderna Sars-Covid-2 Vaccination 06/14/2019, 03/27/2020, 03/27/2020   PFIZER(Purple Top)SARS-COV-2 Vaccination 01/31/2021    Pneumococcal Conjugate-13 08/11/2013, 09/03/2013   Pneumococcal Polysaccharide-23 05/28/2004   Td 07/04/2000   Tdap 07/04/2009, 10/18/2019   Unspecified SARS-COV-2 Vaccination 03/27/2020   Zoster, Live 07/02/2005   Pertinent  Health Maintenance Due  Topic Date Due   INFLUENZA VACCINE  Completed   DEXA SCAN  Completed      03/01/2022   10:09 PM 03/02/2022    7:35 AM 03/13/2022   11:59 AM 03/22/2022    3:07 PM 03/26/2022    2:18 PM  Fall Risk  Falls in the past year?    0 0  Was there an injury with Fall?    0 0  Fall Risk Category Calculator    0 0  Fall Risk Category    Low Low  Patient Fall Risk Level High fall risk Moderate fall risk High fall risk High fall risk High fall risk  Patient at Risk for Falls Due to    History of fall(s);Impaired balance/gait;Impaired mobility History of fall(s);Impaired balance/gait;Impaired mobility  Fall risk Follow up    Falls evaluation completed Falls evaluation completed   Functional Status Survey:    Vitals:   03/26/22 1419  BP: (!) 147/72  Pulse: 78  Resp: 18  Temp: (!) 97.4 F (36.3 C)  SpO2: 98%  Weight: 101 lb 8 oz (46 kg)  Height: 5\' 4"  (1.626 m)   Body mass index is 17.42 kg/m. Physical Exam Vitals and nursing note reviewed.  Constitutional:      Appearance: Normal appearance.  HENT:     Head: Normocephalic and atraumatic.     Mouth/Throat:     Mouth: Mucous membranes are moist.     Pharynx: Oropharynx is clear.  Eyes:     Extraocular Movements: Extraocular movements intact.     Conjunctiva/sclera: Conjunctivae normal.     Pupils: Pupils are equal, round, and reactive to light.  Cardiovascular:     Rate and Rhythm: Normal rate and regular rhythm.     Heart sounds: Murmur heard.     Comments: Murmur is much more prominent upon standing and increased heart rate Pulmonary:     Effort: Pulmonary effort is normal.     Breath sounds: No wheezing, rhonchi or rales.     Comments: Decreased breath sounds on left  consistent with effusion noted on chest x-ray Abdominal:  General: Abdomen is flat. Bowel sounds are normal.     Palpations: Abdomen is soft.     Tenderness: There is no abdominal tenderness.  Musculoskeletal:     Cervical back: Normal range of motion and neck supple.     Right lower leg: Edema present.     Left lower leg: Edema present.     Comments: 1+ edema BLE  Skin:    General: Skin is warm and dry.  Neurological:     General: No focal deficit present.     Mental Status: She is alert and oriented to person, place, and time. Mental status is at baseline.     Gait: Gait abnormal.     Comments: Mild left face weakness.   Psychiatric:        Mood and Affect: Mood normal.        Behavior: Behavior normal.        Thought Content: Thought content normal.        Judgment: Judgment normal.     Labs reviewed: Recent Labs    02/26/22 1351 02/26/22 1722 02/27/22 0430 03/13/22 1102  NA 130*  --  133* 134*  K 5.3* 4.8 4.8 4.2  CL 103  --  108 103  CO2 22  --  20* 25  GLUCOSE 116*  --  94 144*  BUN 41*  --  25* 16  CREATININE 1.05*  --  0.81 0.90  CALCIUM 9.8  --  9.3 10.3  MG  --   --  2.0  --   PHOS  --   --  2.2*  --    Recent Labs    02/26/22 1351 02/27/22 0430 03/13/22 1102  AST 25 25 20   ALT 18 18 29   ALKPHOS 55 56 88  BILITOT 0.8 0.7 0.3  PROT 5.8* 5.5* 6.3*  ALBUMIN 3.3* 3.0* 3.7   Recent Labs    02/26/22 1351 02/27/22 0430 03/13/22 1102  WBC 8.1 6.5 7.2  NEUTROABS 7.4 5.1 5.6  HGB 12.7 12.7 11.8*  HCT 37.7 38.1 33.9*  MCV 91.5 91.1 90.2  PLT 149* 132* 450*   Lab Results  Component Value Date   TSH 2.294 03/13/2022   Lab Results  Component Value Date   HGBA1C 5.7 (H) 02/26/2022   Lab Results  Component Value Date   CHOL 143 02/27/2022   HDL 59 02/27/2022   LDLCALC 67 02/27/2022   TRIG 83 02/27/2022   CHOLHDL 2.4 02/27/2022    Significant Diagnostic Results in last 30 days:  DG Chest 2 View  Result Date: 03/01/2022 CLINICAL DATA:   Pleural effusions.  History of lung cancer. EXAM: CHEST - 2 VIEW COMPARISON:  Chest radiograph dated 02/26/2022. FINDINGS: Bilateral pleural effusions, left greater than right relatively similar to prior radiograph. Left mid to lower lung field reticulonodular interstitial densities again noted. Left perihilar density there evaluated on the CT. Small linear lucency along the left upper mediastinum to the left of the trachea, likely artifactual. A small left pneumothorax is less likely. Attention on follow-up imaging recommended. The cardiac silhouette is within limits. Atherosclerotic calcification of the aorta. Degenerative changes of the spine. No acute osseous pathology. IMPRESSION: Similar appearance of the lungs with small bilateral pleural effusions. Electronically Signed   By: Anner Crete M.D.   On: 03/01/2022 19:09   VAS [place] LOWER EXTREMITY VENOUS (DVT)  Result Date: 02/27/2022  Lower Venous DVT Study Patient Name:  ARIAH MOWER  Date of Exam:   02/27/2022  Medical Rec #: [id number]           Accession #:    [id number] Date of Birth: 09-Oct-1932            Patient Gender: F Patient Age:   10 years Exam Location:  [facility] Procedure:      VAS [place] LOWER EXTREMITY VENOUS (DVT) Referring Phys: MCNEILL KIRKPATRICK --------------------------------------------------------------------------------  Indications: Stroke, and "leg stiffness".  Risk Factors: Chemotherapy. Comparison Study: Previous exam on 05/04/20 was negative for DVT Performing Technologist: Rogelia Rohrer RVT, RDMS  Examination Guidelines: A complete evaluation includes B-mode imaging, spectral Doppler, color Doppler, and power Doppler as needed of all accessible portions of each vessel. Bilateral testing is considered an integral part of a complete examination. Limited examinations for reoccurring indications may be performed as noted. The reflux portion of the exam is performed with the patient in reverse Trendelenburg.   +---------+---------------+---------+-----------+----------+--------------+ RIGHT    CompressibilityPhasicitySpontaneityPropertiesThrombus Aging +---------+---------------+---------+-----------+----------+--------------+ CFV      Full           Yes      Yes                                 +---------+---------------+---------+-----------+----------+--------------+ SFJ      Full                                                        +---------+---------------+---------+-----------+----------+--------------+ FV Prox  Full           Yes      Yes                                 +---------+---------------+---------+-----------+----------+--------------+ FV Mid   Full           Yes      Yes                                 +---------+---------------+---------+-----------+----------+--------------+ FV DistalFull           Yes      Yes                                 +---------+---------------+---------+-----------+----------+--------------+ PFV      Full                                                        +---------+---------------+---------+-----------+----------+--------------+ POP      Full           Yes      Yes                                 +---------+---------------+---------+-----------+----------+--------------+ PTV      Full                                                        +---------+---------------+---------+-----------+----------+--------------+  PERO     Full                                                        +---------+---------------+---------+-----------+----------+--------------+   +---------+---------------+---------+-----------+----------+--------------+ LEFT     CompressibilityPhasicitySpontaneityPropertiesThrombus Aging +---------+---------------+---------+-----------+----------+--------------+ CFV      Full           Yes      Yes                                  +---------+---------------+---------+-----------+----------+--------------+ SFJ      Full                                                        +---------+---------------+---------+-----------+----------+--------------+ FV Prox  Full           Yes      Yes                                 +---------+---------------+---------+-----------+----------+--------------+ FV Mid   Full           Yes      Yes                                 +---------+---------------+---------+-----------+----------+--------------+ FV DistalFull           Yes      Yes                                 +---------+---------------+---------+-----------+----------+--------------+ PFV      Full                                                        +---------+---------------+---------+-----------+----------+--------------+ POP      Full           Yes      Yes                                 +---------+---------------+---------+-----------+----------+--------------+ PTV      Full                                                        +---------+---------------+---------+-----------+----------+--------------+ PERO     Full                                                        +---------+---------------+---------+-----------+----------+--------------+  Summary: BILATERAL: - No evidence of deep vein thrombosis seen in the lower extremities, bilaterally. -No evidence of popliteal cyst, bilaterally.   *See table(s) above for measurements and observations. Electronically signed by Harold Barban MD on 02/27/2022 at 11:50:36 PM.    Final    ECHOCARDIOGRAM COMPLETE  Result Date: 02/27/2022    ECHOCARDIOGRAM REPORT   Patient Name:   DAYRIN STALLONE Date of Exam: 02/27/2022 Medical Rec #:  [id number]          Height:       64.0 in Accession #:    [id number]         Weight:       103.0 lb Date of Birth:  1932-06-18           BSA:          1.476 m Patient Age:    37 years           BP:            148/90 mmHg Patient Gender: F                  HR:           83 bpm. Exam Location:  Inpatient Procedure: 2D Echo, Cardiac Doppler and Color Doppler Indications:    Stroke  History:        Patient has prior history of Echocardiogram examinations, most                 recent 05/04/2020. Risk Factors:Hypertension, Dyslipidemia and                 Former Smoker. Lung cancer stg IV.  Sonographer:    Eartha Inch Referring Phys: [id number] DANIEL V THOMPSON  Sonographer Comments: Technically difficult study due to poor echo windows. Image acquisition challenging due to patient body habitus and Image acquisition challenging due to respiratory motion. IMPRESSIONS  1. Left ventricular ejection fraction, by estimation, is 65 to 70%. The left ventricle has normal function. The left ventricle has no regional wall motion abnormalities. There is mild concentric left ventricular hypertrophy. Left ventricular diastolic parameters are consistent with Grade I diastolic dysfunction (impaired relaxation).  2. Right ventricular systolic function is normal. The right ventricular size is normal. There is normal pulmonary artery systolic pressure.  3. Right atrial size was mildly dilated.  4. The mitral valve is normal in structure. No evidence of mitral valve regurgitation. No evidence of mitral stenosis.  5. The aortic valve is tricuspid. There is mild calcification of the aortic valve. Aortic valve regurgitation is not visualized. Aortic valve sclerosis/calcification is present, without any evidence of aortic stenosis.  6. The inferior vena cava is normal in size with greater than 50% respiratory variability, suggesting right atrial pressure of 3 mmHg. FINDINGS  Left Ventricle: Left ventricular ejection fraction, by estimation, is 65 to 70%. The left ventricle has normal function. The left ventricle has no regional wall motion abnormalities. The left ventricular internal cavity size was normal in size. There is  mild concentric left  ventricular hypertrophy. Left ventricular diastolic parameters are consistent with Grade I diastolic dysfunction (impaired relaxation). Right Ventricle: The right ventricular size is normal. No increase in right ventricular wall thickness. Right ventricular systolic function is normal. There is normal pulmonary artery systolic pressure. The tricuspid regurgitant velocity is 2.44 m/s, and  with an assumed right atrial pressure of 8 mmHg, the estimated right ventricular systolic pressure is 62.1 mmHg. Left Atrium: Left atrial size  was normal in size. Right Atrium: Right atrial size was mildly dilated. Pericardium: There is no evidence of pericardial effusion. Mitral Valve: The mitral valve is normal in structure. Mild mitral annular calcification. No evidence of mitral valve regurgitation. No evidence of mitral valve stenosis. Tricuspid Valve: The tricuspid valve is normal in structure. Tricuspid valve regurgitation is mild . No evidence of tricuspid stenosis. Aortic Valve: The aortic valve is tricuspid. There is mild calcification of the aortic valve. Aortic valve regurgitation is not visualized. Aortic valve sclerosis/calcification is present, without any evidence of aortic stenosis. Pulmonic Valve: The pulmonic valve was normal in structure. Pulmonic valve regurgitation is not visualized. No evidence of pulmonic stenosis. Aorta: The aortic root is normal in size and structure. Venous: The inferior vena cava is normal in size with greater than 50% respiratory variability, suggesting right atrial pressure of 3 mmHg. IAS/Shunts: No atrial level shunt detected by color flow Doppler.  LEFT VENTRICLE PLAX 2D LVIDd:         2.80 cm     Diastology LVIDs:         1.80 cm     LV e' medial:    5.02 cm/s LV PW:         1.00 cm     LV E/e' medial:  17.4 LV IVS:        1.00 cm     LV e' lateral:   3.30 cm/s LVOT diam:     1.80 cm     LV E/e' lateral: 26.5 LV SV:         82 LV SV Index:   56 LVOT Area:     2.54 cm  LV Volumes  (MOD) LV vol d, MOD A2C: 44.7 ml LV vol d, MOD A4C: 37.5 ml LV vol s, MOD A2C: 10.6 ml LV vol s, MOD A4C: 8.8 ml LV SV MOD A2C:     34.1 ml LV SV MOD A4C:     37.5 ml LV SV MOD BP:      32.3 ml RIGHT VENTRICLE             IVC RV S prime:     14.90 cm/s  IVC diam: 2.10 cm TAPSE (M-mode): 2.5 cm LEFT ATRIUM           Index        RIGHT ATRIUM           Index LA diam:      2.60 cm 1.76 cm/m   RA Area:     10.60 cm LA Vol (A2C): 44.5 ml 30.16 ml/m  RA Volume:   22.40 ml  15.18 ml/m LA Vol (A4C): 18.7 ml 12.67 ml/m  AORTIC VALVE LVOT Vmax:   137.00 cm/s LVOT Vmean:  100.000 cm/s LVOT VTI:    0.322 m  AORTA Ao Root diam: 2.70 cm Ao Asc diam:  2.90 cm MITRAL VALVE                TRICUSPID VALVE MV Area (PHT): 1.72 cm     TR Peak grad:   23.8 mmHg MV Decel Time: 440 msec     TR Mean grad:   16.0 mmHg MV E velocity: 87.30 cm/s   TR Vmax:        244.00 cm/s MV A velocity: 156.00 cm/s  TR Vmean:       197.0 cm/s MV E/A ratio:  0.56  SHUNTS                             Systemic VTI:  0.32 m                             Systemic Diam: 1.80 cm Glori Bickers MD Electronically signed by Glori Bickers MD Signature Date/Time: 02/27/2022/4:40:25 PM    Final    CT ANGIO HEAD NECK W WO CM  Result Date: 02/26/2022 CLINICAL DATA:  Weakness after receiving chemotherapy; small acute infarct in the left cerebellum on same-day MRI EXAM: CT ANGIOGRAPHY HEAD AND NECK TECHNIQUE: Multidetector CT imaging of the head and neck was performed using the standard protocol during bolus administration of intravenous contrast. Multiplanar CT image reconstructions and MIPs were obtained to evaluate the vascular anatomy. Carotid stenosis measurements (when applicable) are obtained utilizing NASCET criteria, using the distal internal carotid diameter as the denominator. RADIATION DOSE REDUCTION: This exam was performed according to the departmental dose-optimization program which includes automated exposure control,  adjustment of the mA and/or kV according to patient size and/or use of iterative reconstruction technique. CONTRAST:  84mL OMNIPAQUE IOHEXOL 350 MG/ML SOLN COMPARISON:  No prior CT or CTA, correlation is made with 02/26/2022 MRI head and 01/28/2022 CT chest FINDINGS: CT HEAD FINDINGS Brain: Linear hypodensity in the left cerebellum (series 14, image 7) correlates with the acute infarcts seen on the same-day MRI. No additional acute infarct. The known extra-axial mass along the right frontoparietal convexity is better seen on the same-day MRI (series 14, image 25). The known right posterior temporal lobe lesion is not appreciated on CT. No acute hemorrhage, mass effect, or midline shift. No hydrocephalus or extra-axial fluid collection. Vascular: No hyperdense vessel. Skull: Normal. Negative for fracture or focal lesion. Sinuses/Orbits: No acute finding. Status post bilateral lens replacements. Other: The mastoid air cells are well aerated. CTA NECK FINDINGS Aortic arch: Standard branching. Imaged portion shows no evidence of aneurysm or dissection. No significant stenosis of the major arch vessel origins. Right carotid system: No evidence of dissection, occlusion, or hemodynamically significant stenosis (greater than 50%). Left carotid system: No evidence of dissection, occlusion, or hemodynamically significant stenosis (greater than 50%). Vertebral arteries: No evidence of dissection, occlusion, or hemodynamically significant stenosis (greater than 50%). Skeleton: Redemonstrated sclerotic lesion in the left aspect T5, as seen on the 01/28/2022 study. A punctate lesion in the left aspect of T1 is also noted, unchanged. Hyperdense focus in the left lateral mass of C2 (series 8, image 180), bone island versus additional sclerotic lesion. Other neck: Negative. Upper chest: Scattered nodular opacities in the superior left lower lobe (series 6, image 1), which appears similar to 01/28/2022. No pleural effusion. Review of  the MIP images confirms the above findings CTA HEAD FINDINGS Anterior circulation: Both internal carotid arteries are patent to the termini, without significant stenosis. A1 segments patent. Normal anterior communicating artery. Anterior cerebral arteries are patent to their distal aspects. No M1 stenosis or occlusion. MCA branches perfused and symmetric. Posterior circulation: Vertebral arteries patent to the vertebrobasilar junction without stenosis. Posterior inferior cerebellar arteries patent proximally. Basilar patent to its distal aspect. Superior cerebellar arteries patent proximally. Patent P1 segments, diminutive on right. Near fetal origin of the right PCA from a prominent right posterior communicating artery. PCAs perfused to their distal aspects without stenosis. The left posterior communicating artery is not visualized. Venous sinuses: As  permitted by contrast timing, patent. Anatomic variants: Near fetal origin of the right PCA. Review of the MIP images confirms the above findings IMPRESSION: 1. No intracranial large vessel occlusion or significant stenosis. 2. No hemodynamically significant stenosis in the neck. 3. Scattered nodular opacities in the superior left lower lobe, similar to 01/28/2022, which may be infectious or inflammatory. 4. Redemonstrated sclerotic lesion in the left aspect of T5, as well as a punctate lesion in the left aspect of T1. Additional sclerotic focus in the left lateral mass of C2, concerning for an additional site of osseous metastatic disease. 5. Linear hypodensity in the left cerebellum correlates with the acute infarcts seen on the same-day MRI. No additional acute intracranial process. Electronically Signed   By: Merilyn Baba M.D.   On: 02/26/2022 18:48   MR Brain W and Wo Contrast  Result Date: 02/26/2022 CLINICAL DATA:  Brain/CNS neoplasm, assess treatment response EXAM: MRI HEAD WITHOUT AND WITH CONTRAST TECHNIQUE: Multiplanar, multiecho pulse sequences of  the brain and surrounding structures were obtained without and with intravenous contrast. CONTRAST:  13mL GADAVIST GADOBUTROL 1 MMOL/ML IV SOLN COMPARISON:  MRI August 14, 2020. FINDINGS: Brain: Similar size of an extra-axial dural-based lesion along the right frontoparietal convexity, which measures 1.4 cm on series 16, image 143. Similar versus slightly decreased conspicuity of a tiny lesion in the posterior temporal lobe (series 16, image 104) with faint susceptibility artifact (series 11, image 33). Otherwise, no abnormal enhancement. No new lesions identified. 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 Slight edema but no mass effect. No evidence of acute hemorrhage or midline shift. No hydrocephalus. Vascular: Major arterial flow voids are maintained at the skull base. Skull and upper cervical spine: Normal marrow signal. Sinuses/Orbits: Clear sinuses.  No acute orbital findings. Other: No mastoid effusions. IMPRESSION: 1. 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 Slight edema but no mass effect. 2. Similar versus slightly decreased conspicuity of a tiny treated lesion the posterior right temporal lobe. No progressive disease. 3. Unchanged extra-axial 1.4 cm lesion along the right chronic temporal convexity, probably a meningioma. Electronically Signed   By: Margaretha Sheffield M.D.   On: 02/26/2022 15:07   DG Chest Port 1 View  Result Date: 02/26/2022 CLINICAL DATA:  Generalized weakness EXAM: PORTABLE CHEST 1 VIEW COMPARISON:  06/30/2020 FINDINGS: Cardiac size is within normal limits. Low position of diaphragms suggests COPD. Small to moderate left pleural effusion is seen. There is interval removal of left chest tube. Increased interstitial markings are seen in left lower lung field. There is no focal consolidation. There is no significant right pleural effusion. There is no pneumothorax. IMPRESSION: Small to moderate left pleural effusion. Increased markings in left lower lung fields  suggest possible interstitial pneumonia. Electronically Signed   By: Elmer Picker M.D.   On: 02/26/2022 14:14    Assessment/Plan Orthostatic hypotension Orthostatic hypotension, controlled on Midodrine. The patient has been feeling groggy even if prior to the stroke, worsened after getting up/moving about, not dizziness or lightheadedness in nature, no change vision, no associated with headache or focal weakness.   Lung cancer ([place]) Hx of a large left pleural effusion, s/p thoracentesis x2, drained 1.5L, Cytology showed adenocarcinoma stage IV lung cancer, CT showed left upper lobe mass invades the mediastinum at the level of the aortic or pulmonayr window, concerning for lymphangitic spread of disease,  f/u pulmonology Dr. Valeta Harms,  oncology Dr. Julien Nordmann, taking palliative chemotherapy/immunotherapy, on Tagrisso. CXR in this hospitalization showed small  to moderate left pleural effusion. Decadron before and after chemo.  Anemia, chronic disease Hgb 10.8 03/21/22  Tachycardia heart rate is in control, in 80s at rest, on Metoprolol  Hyperlipidemia akes Atorvastatin, LDL 67 02/27/22  DOE (dyspnea on exertion)  better SOB/mild valvular heart disease, Echo EF 65-70%  Hypophosphatemia  2.2 02/27/22  Hyponatremia Na 132 03/21/22  Edema Chronic, in ankles, moderate, off Spironolactone.         History of cerebellar stroke 02/26/22 hospitalized for a small acute infarct in the left cerebellum on MRI, completed 3 weeks of DAPT, on ASA only, balance and gait issue are improving slowly. upcoming neurological appointment in 2-3 weeks.     Family/ staff Communication: plan of care reviewed with the patient and charge nurse.   Labs/tests ordered:  none

## 2022-03-26 NOTE — Assessment & Plan Note (Signed)
Chronic, in ankles, moderate, off Spironolactone.

## 2022-03-26 NOTE — Assessment & Plan Note (Signed)
Hx of a large left pleural effusion, s/p thoracentesis x2, drained 1.5L, Cytology showed adenocarcinoma stage IV lung cancer, CT showed left upper lobe mass invades the mediastinum at the level of the aortic or pulmonayr window, concerning for lymphangitic spread of disease,  f/u pulmonology Dr. Valeta Harms,  oncology Dr. Julien Nordmann, taking palliative chemotherapy/immunotherapy, on Tagrisso. CXR in this hospitalization showed small to moderate left pleural effusion. Decadron before and after chemo.

## 2022-03-26 NOTE — Assessment & Plan Note (Signed)
heart rate is in control, in 80s at rest, on Metoprolol

## 2022-03-26 NOTE — Assessment & Plan Note (Deleted)
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 upcoming neurological appointment in 2-3 weeks.

## 2022-03-26 NOTE — Assessment & Plan Note (Signed)
Na 132 03/21/22

## 2022-03-26 NOTE — Assessment & Plan Note (Signed)
better SOB/mild valvular heart disease, Echo EF 65-70%

## 2022-03-27 ENCOUNTER — Other Ambulatory Visit: Payer: Medicare PPO

## 2022-03-28 ENCOUNTER — Ambulatory Visit: Payer: Medicare PPO | Attending: Cardiology

## 2022-03-28 ENCOUNTER — Non-Acute Institutional Stay: Payer: Medicare PPO | Admitting: Internal Medicine

## 2022-03-28 ENCOUNTER — Encounter: Payer: Self-pay | Admitting: Internal Medicine

## 2022-03-28 DIAGNOSIS — M6281 Muscle weakness (generalized): Secondary | ICD-10-CM | POA: Diagnosis not present

## 2022-03-28 DIAGNOSIS — C3412 Malignant neoplasm of upper lobe, left bronchus or lung: Secondary | ICD-10-CM | POA: Diagnosis not present

## 2022-03-28 DIAGNOSIS — I639 Cerebral infarction, unspecified: Secondary | ICD-10-CM

## 2022-03-28 DIAGNOSIS — R42 Dizziness and giddiness: Secondary | ICD-10-CM | POA: Diagnosis not present

## 2022-03-28 DIAGNOSIS — R000 Tachycardia, unspecified: Secondary | ICD-10-CM

## 2022-03-28 DIAGNOSIS — I951 Orthostatic hypotension: Secondary | ICD-10-CM | POA: Diagnosis not present

## 2022-03-28 DIAGNOSIS — E781 Pure hyperglyceridemia: Secondary | ICD-10-CM | POA: Diagnosis not present

## 2022-03-28 DIAGNOSIS — I63442 Cerebral infarction due to embolism of left cerebellar artery: Secondary | ICD-10-CM | POA: Diagnosis not present

## 2022-03-28 DIAGNOSIS — R110 Nausea: Secondary | ICD-10-CM | POA: Diagnosis not present

## 2022-03-28 DIAGNOSIS — D638 Anemia in other chronic diseases classified elsewhere: Secondary | ICD-10-CM

## 2022-03-28 DIAGNOSIS — R600 Localized edema: Secondary | ICD-10-CM

## 2022-03-28 DIAGNOSIS — I10 Essential (primary) hypertension: Secondary | ICD-10-CM

## 2022-03-28 DIAGNOSIS — R29898 Other symptoms and signs involving the musculoskeletal system: Secondary | ICD-10-CM | POA: Diagnosis not present

## 2022-03-28 DIAGNOSIS — E782 Mixed hyperlipidemia: Secondary | ICD-10-CM

## 2022-03-28 LAB — CBC AND DIFFERENTIAL
HCT: 32 — AB (ref 36–46)
Hemoglobin: 11 — AB (ref 12.0–16.0)
Neutrophils Absolute: 1821
Platelets: 80 10*3/uL — AB (ref 150–400)
WBC: 3

## 2022-03-28 LAB — BASIC METABOLIC PANEL
BUN: 15 (ref 4–21)
CO2: 25 — AB (ref 13–22)
Chloride: 103 (ref 99–108)
Creatinine: 0.8 (ref 0.5–1.1)
Glucose: 85
Potassium: 3.8 mEq/L (ref 3.5–5.1)
Sodium: 136 — AB (ref 137–147)

## 2022-03-28 LAB — COMPREHENSIVE METABOLIC PANEL
Albumin: 3.7 (ref 3.5–5.0)
Calcium: 10.1 (ref 8.7–10.7)
Globulin: 2.2
eGFR: 69

## 2022-03-28 LAB — HEPATIC FUNCTION PANEL
ALT: 24 U/L (ref 7–35)
AST: 23 (ref 13–35)
Alkaline Phosphatase: 80 (ref 25–125)

## 2022-03-28 LAB — CBC: RBC: 3.55 — AB (ref 3.87–5.11)

## 2022-03-28 NOTE — Progress Notes (Signed)
Provider:   Location:  [facility] Room Number: 31/A Place of Service:  ALF ([phone])  PCP: [name], MD Patient Care Team: [name], MD as PCP - General (Internal Medicine) Pixie Casino, MD as PCP - Cardiology (Cardiology) Valrie Hart, RN as Oncology Nurse Navigator (Oncology)  Extended Emergency Contact Information Primary Emergency Contact: Smithwick,Pat Home Phone: [phone] Mobile Phone: [phone] Relation: Friend Secondary Emergency Contact: Genice Rouge Address: [place], [place] [address] [place] Phone: [phone] Work Phone: [phone] Relation: Other  Code Status: DNR Goals of Care: Advanced Directive information    03/28/2022    3:32 PM  Advanced Directives  Does Patient Have a Medical Advance Directive? Yes  Type of Paramedic of [place];Living will;Out of facility DNR (pink MOST or yellow form)  Does patient want to make changes to medical advance directive? No - Patient declined  Copy of [place] in Chart? Yes - validated most recent copy scanned in chart (See row information)      Chief Complaint  Patient presents with   new admit to AL    Patient is new Admit to AL   Immunizations    Discussed the need for Shingles vaccine    HPI: Patient is a 88 y.o. female seen today for admission to AL  Patient was admitted in the hospital from 10/17 to 10/21 for acute cerebellar ischemic infarct   Patient has a history of hypertension hyperlipidemia, lower extremity edema Metastatic lung disease Recently due to the progression of her disease she was started on palliative systemic chemotherapy.  Patient went to ED due to unsteady gait.  She had MRI of her brain done which showed acute infarct in left cerebellar Seen by neurology recommended DAPT  for 20 days followed by aspirin alone  She was discharged back [facility]  continue to be orthostatic was continued on midodrine Also had tachycardia and is now on low-dose of Lopressor Cardiology is also planning for a 30-day monitor Her echo showed EF of 65 to 70% with mild concentric LVH Patient did well with rehab and is now walking with her walker.  Continues to be little unstable.  Now planning to stay in AL for now Had no acute complains Poor appetite Has lost some weight Weakness continues SOB on exertion  Past Medical History:  Diagnosis Date   Bruit    Abdominal bruit - Abdominal aorta/renal duplex Doppler evaluation 12/07/03 -Mildly abnormal evaluation. 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 This is consistant w/median arcuate ligament compression syndrome. *Right & Left Kidney: Essentially equal in size, symmetrical in shape w/no significant abnormalities visualized. *Right & Left Renal Arteries: No significant abnormalities.   Cancer ([place])    Edema extremities    LE edema    H/O myocardial perfusion scan 02/20/00   To rule out ischemia - Negative adequate Bruce protocol exercise stress test with a deconditioned exercise response and normal static myocardial perfusion images with EF calculated by QGS of 77%. Represents a low risk study.   Hyperlipidemia    Hypertension    Osteoarthritis    Osteoporosis    Pleural effusion 04/2020   Valvular heart disease    Mild valvular heart disease by Echo in 2010, all mild and symptomatic, including concentric LVH, MR, TR, and AI with pulmonary artery pressure of 32. EF was normal.  Past Surgical History:  Procedure Laterality Date   Abdominal aorta/Renal duplex Doppler Evaluation  12/07/03   For abdominal bruit. Mildly abnormal evaluation. (See bruit in medical history)   BREAST EXCISIONAL BIOPSY Left 1966   BREAST EXCISIONAL BIOPSY Left 1999   CATARACT EXTRACTION  2003   CHEST TUBE INSERTION N/A 07/04/2020   Procedure: REMOVAL PLEURAL DRAINAGE CATHETER;  Surgeon: Garner Nash, DO;  Location: [facility];  Service: Pulmonary;  Laterality: N/A;   COLONOSCOPY  10/22/2004   DEXA Bone Scan  06/23/2013   IR PERC PLEURAL DRAIN W/INDWELL CATH W/IMG GUIDE  05/10/2020   IR THORACENTESIS ASP PLEURAL SPACE W/IMG GUIDE  05/04/2020    reports that she quit smoking about 59 years ago. Her smoking use included cigarettes. She has a 1.50 pack-year smoking history. She has never used smokeless tobacco. She reports current alcohol use of about 2.0 standard drinks of alcohol per week. She reports that she does not use drugs. Social History   Socioeconomic History   Marital status: Single    Spouse name: Not on file   Number of children: Not on file   Years of education: Not on file   Highest education level: Not on file  Occupational History   Occupation: Retired    Fish farm manager: [employer]    Comment: Chemistry professor  Tobacco Use   Smoking status: Former    Packs/day: 0.25    Years: 6.00    Total pack years: 1.50    Types: Cigarettes    Quit date: 05/13/1962    Years since quitting: 59.9   Smokeless tobacco: Never  Vaping Use   Vaping Use: Never used  Substance and Sexual Activity   Alcohol use: Yes    Alcohol/week: 2.0 standard drinks of alcohol    Types: 2 Standard drinks or equivalent per week    Comment: eine   Drug use: No   Sexual activity: Not on file  Other Topics Concern   Not on file  Social History Narrative   Not on file   Social Determinants of Health   Financial Resource Strain: Low Risk  (09/10/2021)   Overall Financial Resource Strain (CARDIA)    Difficulty of Paying Living Expenses: Not hard at all  Food Insecurity: No Food Insecurity (02/26/2022)   Hunger Vital Sign    Worried About Running Out of Food in the Last Year: Never true    Ran Out of Food in the Last Year: Never true  Transportation Needs: No Transportation Needs (02/26/2022)   PRAPARE - Hydrologist (Medical): No    Lack of  Transportation (Non-Medical): No  Physical Activity: Sufficiently Active (09/10/2021)   Exercise Vital Sign    Days of Exercise per Week: 7 days    Minutes of Exercise per Session: 30 min  Stress: No Stress Concern Present (09/10/2021)   [place]    Feeling of Stress : Not at all  Social Connections: Socially Isolated (09/10/2021)   Social Connection and Isolation Panel [NHANES]    Frequency of Communication with Friends and Family: Twice a week    Frequency of Social Gatherings with Friends and Family: Three times a week    Attends Religious Services: Never    Active Member of Clubs or Organizations: No    Attends Archivist Meetings: Never    Marital Status: Never married  Intimate Partner Violence: Not At Risk (02/26/2022)   Humiliation, Afraid,  Rape, and Kick questionnaire    Fear of Current or Ex-Partner: No    Emotionally Abused: No    Physically Abused: No    Sexually Abused: No    Functional Status Survey:    Family History  Problem Relation Age of Onset   Leukemia Mother 68   Cancer Father        esophagial cancer   Heart failure Maternal Grandmother    Tuberculosis Maternal Grandfather    Heart disease Paternal Grandmother    Cancer Paternal Grandfather     Health Maintenance  Topic Date Due   Zoster Vaccines- Shingrix (1 of 2) Never done   COVID-19 Vaccine (6 - Mixed Product risk series) 05/09/2022   Medicare Annual Wellness (AWV)  09/11/2022   TETANUS/TDAP  10/17/2029   Pneumonia Vaccine 72+ Years old  Completed   INFLUENZA VACCINE  Completed   DEXA SCAN  Completed   HPV VACCINES  Aged Out    Allergies  Allergen Reactions   Irbesartan Nausea Only    Outpatient Encounter Medications as of 03/28/2022  Medication Sig   acetaminophen (TYLENOL) 325 MG tablet Take 2 tablets (650 mg total) by mouth every 6 (six) hours as needed for mild pain (or Fever >/= 101).   aspirin 81 MG tablet  Take 81 mg by mouth daily.   atorvastatin (LIPITOR) 20 MG tablet TAKE 1 TABLET BY MOUTH EVERYDAY AT BEDTIME (Patient taking differently: Take 20 mg by mouth daily.)   folic acid (FOLVITE) 1 MG tablet Take 1 tablet (1 mg total) by mouth daily.   magnesium hydroxide (MILK OF MAGNESIA) 400 MG/5ML suspension Take 15 mLs by mouth daily as needed for mild constipation.   metoprolol tartrate (LOPRESSOR) 25 MG tablet Take 12.5 mg by mouth 2 (two) times daily.   midodrine (PROAMATINE) 2.5 MG tablet Take 1 tablet (2.5 mg total) by mouth 2 (two) times daily as needed (if sbp <100 on standing.).   osimertinib mesylate (TAGRISSO) 40 MG tablet Take 1 tablet (40 mg total) by mouth daily.   prochlorperazine (COMPAZINE) 10 MG tablet Take 1 tablet (10 mg total) by mouth every 6 (six) hours as needed.   [DISCONTINUED] bisacodyl (DULCOLAX) 5 MG EC tablet Take 5 mg by mouth daily as needed for moderate constipation.   [DISCONTINUED] clopidogrel (PLAVIX) 75 MG tablet Take 75 mg by mouth daily.   [DISCONTINUED] dexamethasone (DECADRON) 4 MG tablet Please take 1 tablet twice a day the day before, the day of, and the day after chemotherapy   [DISCONTINUED] IVIZIA DRY EYES 0.5 % SOLN Place 1 drop into both eyes in the morning.   No facility-administered encounter medications on file as of 03/28/2022.    Review of Systems  Constitutional:  Positive for activity change and appetite change.  HENT: Negative.    Respiratory:  Negative for cough and shortness of breath.   Cardiovascular:  Positive for leg swelling.  Gastrointestinal:  Negative for constipation.  Genitourinary: Negative.   Musculoskeletal:  Positive for gait problem. Negative for arthralgias and myalgias.  Skin: Negative.   Neurological:  Positive for weakness and light-headedness. Negative for dizziness.  Psychiatric/Behavioral:  Negative for confusion, dysphoric mood and sleep disturbance.     Vitals:   03/28/22 1525  BP: 132/82  Pulse: 98  Resp:  18  Temp: 97.9 F (36.6 C)  TempSrc: Temporal  SpO2: 99%  Height: 5\' 4"  (1.626 m)   Body mass index is 17.42 kg/m. Physical Exam Vitals reviewed.  Constitutional:  Appearance: Normal appearance.  HENT:     Head: Normocephalic.     Nose: Nose normal.     Mouth/Throat:     Mouth: Mucous membranes are moist.     Pharynx: Oropharynx is clear.  Eyes:     Pupils: Pupils are equal, round, and reactive to light.  Cardiovascular:     Rate and Rhythm: Normal rate and regular rhythm.     Pulses: Normal pulses.     Heart sounds: Normal heart sounds. No murmur heard. Pulmonary:     Effort: Pulmonary effort is normal.     Comments: Reduced Breadth sounds in Left Base Abdominal:     General: Abdomen is flat. Bowel sounds are normal.     Palpations: Abdomen is soft.  Musculoskeletal:        General: Swelling present.     Cervical back: Neck supple.  Skin:    General: Skin is warm.  Neurological:     General: No focal deficit present.     Mental Status: She is alert and oriented to person, place, and time.  Psychiatric:        Mood and Affect: Mood normal.        Thought Content: Thought content normal.     Labs reviewed: Basic Metabolic Panel: Recent Labs    02/26/22 1351 02/26/22 1722 02/27/22 0430 03/13/22 1102  NA 130*  --  133* 134*  K 5.3* 4.8 4.8 4.2  CL 103  --  108 103  CO2 22  --  20* 25  GLUCOSE 116*  --  94 144*  BUN 41*  --  25* 16  CREATININE 1.05*  --  0.81 0.90  CALCIUM 9.8  --  9.3 10.3  MG  --   --  2.0  --   PHOS  --   --  2.2*  --    Liver Function Tests: Recent Labs    02/26/22 1351 02/27/22 0430 03/13/22 1102  AST 25 25 20   ALT 18 18 29   ALKPHOS 55 56 88  BILITOT 0.8 0.7 0.3  PROT 5.8* 5.5* 6.3*  ALBUMIN 3.3* 3.0* 3.7   No results for input(s): "LIPASE", "AMYLASE" in the last 8760 hours. No results for input(s): "AMMONIA" in the last 8760 hours. CBC: Recent Labs    02/26/22 1351 02/27/22 0430 03/13/22 1102  WBC 8.1 6.5 7.2   NEUTROABS 7.4 5.1 5.6  HGB 12.7 12.7 11.8*  HCT 37.7 38.1 33.9*  MCV 91.5 91.1 90.2  PLT 149* 132* 450*   Cardiac Enzymes: No results for input(s): "CKTOTAL", "CKMB", "CKMBINDEX", "TROPONINI" in the last 8760 hours. BNP: Invalid input(s): "POCBNP" Lab Results  Component Value Date   HGBA1C 5.7 (H) 02/26/2022   Lab Results  Component Value Date   TSH 2.294 03/13/2022   No results found for: "VITAMINB12" No results found for: "FOLATE" No results found for: "IRON", "TIBC", "FERRITIN"  Imaging and Procedures obtained prior to SNF admission: VAS [place] LOWER EXTREMITY VENOUS (DVT)  Result Date: 02/27/2022  Lower Venous DVT Study Patient Name:  KACEY DYSERT  Date of Exam:   02/27/2022 Medical Rec #: [id number]           Accession #:    [id number] Date of Birth: [birth year]            Patient Gender: F Patient Age:   4 years Exam Location:  [facility] Procedure:      VAS [place] LOWER EXTREMITY VENOUS (DVT) Referring Phys: MCNEILL KIRKPATRICK --------------------------------------------------------------------------------  Indications: Stroke, and "leg stiffness".  Risk Factors: Chemotherapy. Comparison Study: Previous exam on 05/04/20 was negative for DVT Performing Technologist: Rogelia Rohrer RVT, RDMS  Examination Guidelines: A complete evaluation includes B-mode imaging, spectral Doppler, color Doppler, and power Doppler as needed of all accessible portions of each vessel. Bilateral testing is considered an integral part of a complete examination. Limited examinations for reoccurring indications may be performed as noted. The reflux portion of the exam is performed with the patient in reverse Trendelenburg.  +---------+---------------+---------+-----------+----------+--------------+ RIGHT    CompressibilityPhasicitySpontaneityPropertiesThrombus Aging +---------+---------------+---------+-----------+----------+--------------+ CFV      Full           Yes      Yes                                  +---------+---------------+---------+-----------+----------+--------------+ SFJ      Full                                                        +---------+---------------+---------+-----------+----------+--------------+ FV Prox  Full           Yes      Yes                                 +---------+---------------+---------+-----------+----------+--------------+ FV Mid   Full           Yes      Yes                                 +---------+---------------+---------+-----------+----------+--------------+ FV DistalFull           Yes      Yes                                 +---------+---------------+---------+-----------+----------+--------------+ PFV      Full                                                        +---------+---------------+---------+-----------+----------+--------------+ POP      Full           Yes      Yes                                 +---------+---------------+---------+-----------+----------+--------------+ PTV      Full                                                        +---------+---------------+---------+-----------+----------+--------------+ PERO     Full                                                        +---------+---------------+---------+-----------+----------+--------------+   +---------+---------------+---------+-----------+----------+--------------+  LEFT     CompressibilityPhasicitySpontaneityPropertiesThrombus Aging +---------+---------------+---------+-----------+----------+--------------+ CFV      Full           Yes      Yes                                 +---------+---------------+---------+-----------+----------+--------------+ SFJ      Full                                                        +---------+---------------+---------+-----------+----------+--------------+ FV Prox  Full           Yes      Yes                                  +---------+---------------+---------+-----------+----------+--------------+ FV Mid   Full           Yes      Yes                                 +---------+---------------+---------+-----------+----------+--------------+ FV DistalFull           Yes      Yes                                 +---------+---------------+---------+-----------+----------+--------------+ PFV      Full                                                        +---------+---------------+---------+-----------+----------+--------------+ POP      Full           Yes      Yes                                 +---------+---------------+---------+-----------+----------+--------------+ PTV      Full                                                        +---------+---------------+---------+-----------+----------+--------------+ PERO     Full                                                        +---------+---------------+---------+-----------+----------+--------------+     Summary: BILATERAL: - No evidence of deep vein thrombosis seen in the lower extremities, bilaterally. -No evidence of popliteal cyst, bilaterally.   *See table(s) above for measurements and observations. Electronically signed by Harold Barban MD on 02/27/2022 at 11:50:36 PM.    Final    ECHOCARDIOGRAM COMPLETE  Result Date: 02/27/2022    ECHOCARDIOGRAM REPORT  Patient Name:   RAEGAN WINDERS Date of Exam: 02/27/2022 Medical Rec #:  [id number]          Height:       64.0 in Accession #:    [id number]         Weight:       103.0 lb Date of Birth:  08-07-1932           BSA:          1.476 m Patient Age:    88 years           BP:           148/90 mmHg Patient Gender: F                  HR:           83 bpm. Exam Location:  Inpatient Procedure: 2D Echo, Cardiac Doppler and Color Doppler Indications:    Stroke  History:        Patient has prior history of Echocardiogram examinations, most                 recent 05/04/2020. Risk  Factors:Hypertension, Dyslipidemia and                 Former Smoker. Lung cancer stg IV.  Sonographer:    Eartha Inch Referring Phys: [id number] DANIEL V THOMPSON  Sonographer Comments: Technically difficult study due to poor echo windows. Image acquisition challenging due to patient body habitus and Image acquisition challenging due to respiratory motion. IMPRESSIONS  1. Left ventricular ejection fraction, by estimation, is 65 to 70%. The left ventricle has normal function. The left ventricle has no regional wall motion abnormalities. There is mild concentric left ventricular hypertrophy. Left ventricular diastolic parameters are consistent with Grade I diastolic dysfunction (impaired relaxation).  2. Right ventricular systolic function is normal. The right ventricular size is normal. There is normal pulmonary artery systolic pressure.  3. Right atrial size was mildly dilated.  4. The mitral valve is normal in structure. No evidence of mitral valve regurgitation. No evidence of mitral stenosis.  5. The aortic valve is tricuspid. There is mild calcification of the aortic valve. Aortic valve regurgitation is not visualized. Aortic valve sclerosis/calcification is present, without any evidence of aortic stenosis.  6. The inferior vena cava is normal in size with greater than 50% respiratory variability, suggesting right atrial pressure of 3 mmHg. FINDINGS  Left Ventricle: Left ventricular ejection fraction, by estimation, is 65 to 70%. The left ventricle has normal function. The left ventricle has no regional wall motion abnormalities. The left ventricular internal cavity size was normal in size. There is  mild concentric left ventricular hypertrophy. Left ventricular diastolic parameters are consistent with Grade I diastolic dysfunction (impaired relaxation). Right Ventricle: The right ventricular size is normal. No increase in right ventricular wall thickness. Right ventricular systolic function is normal. There is  normal pulmonary artery systolic pressure. The tricuspid regurgitant velocity is 2.44 m/s, and  with an assumed right atrial pressure of 8 mmHg, the estimated right ventricular systolic pressure is 93.5 mmHg. Left Atrium: Left atrial size was normal in size. Right Atrium: Right atrial size was mildly dilated. Pericardium: There is no evidence of pericardial effusion. Mitral Valve: The mitral valve is normal in structure. Mild mitral annular calcification. No evidence of mitral valve regurgitation. No evidence of mitral valve stenosis. Tricuspid Valve: The tricuspid valve is normal in structure. Tricuspid valve regurgitation is mild . No  evidence of tricuspid stenosis. Aortic Valve: The aortic valve is tricuspid. There is mild calcification of the aortic valve. Aortic valve regurgitation is not visualized. Aortic valve sclerosis/calcification is present, without any evidence of aortic stenosis. Pulmonic Valve: The pulmonic valve was normal in structure. Pulmonic valve regurgitation is not visualized. No evidence of pulmonic stenosis. Aorta: The aortic root is normal in size and structure. Venous: The inferior vena cava is normal in size with greater than 50% respiratory variability, suggesting right atrial pressure of 3 mmHg. IAS/Shunts: No atrial level shunt detected by color flow Doppler.  LEFT VENTRICLE PLAX 2D LVIDd:         2.80 cm     Diastology LVIDs:         1.80 cm     LV e' medial:    5.02 cm/s LV PW:         1.00 cm     LV E/e' medial:  17.4 LV IVS:        1.00 cm     LV e' lateral:   3.30 cm/s LVOT diam:     1.80 cm     LV E/e' lateral: 26.5 LV SV:         82 LV SV Index:   56 LVOT Area:     2.54 cm  LV Volumes (MOD) LV vol d, MOD A2C: 44.7 ml LV vol d, MOD A4C: 37.5 ml LV vol s, MOD A2C: 10.6 ml LV vol s, MOD A4C: 8.8 ml LV SV MOD A2C:     34.1 ml LV SV MOD A4C:     37.5 ml LV SV MOD BP:      32.3 ml RIGHT VENTRICLE             IVC RV S prime:     14.90 cm/s  IVC diam: 2.10 cm TAPSE (M-mode): 2.5 cm  LEFT ATRIUM           Index        RIGHT ATRIUM           Index LA diam:      2.60 cm 1.76 cm/m   RA Area:     10.60 cm LA Vol (A2C): 44.5 ml 30.16 ml/m  RA Volume:   22.40 ml  15.18 ml/m LA Vol (A4C): 18.7 ml 12.67 ml/m  AORTIC VALVE LVOT Vmax:   137.00 cm/s LVOT Vmean:  100.000 cm/s LVOT VTI:    0.322 m  AORTA Ao Root diam: 2.70 cm Ao Asc diam:  2.90 cm MITRAL VALVE                TRICUSPID VALVE MV Area (PHT): 1.72 cm     TR Peak grad:   23.8 mmHg MV Decel Time: 440 msec     TR Mean grad:   16.0 mmHg MV E velocity: 87.30 cm/s   TR Vmax:        244.00 cm/s MV A velocity: 156.00 cm/s  TR Vmean:       197.0 cm/s MV E/A ratio:  0.56                             SHUNTS                             Systemic VTI:  0.32 m  Systemic Diam: 1.80 cm Glori Bickers MD Electronically signed by Glori Bickers MD Signature Date/Time: 02/27/2022/4:40:25 PM    Final    CT ANGIO HEAD NECK W WO CM  Result Date: 02/26/2022 CLINICAL DATA:  Weakness after receiving chemotherapy; small acute infarct in the left cerebellum on same-day MRI EXAM: CT ANGIOGRAPHY HEAD AND NECK TECHNIQUE: Multidetector CT imaging of the head and neck was performed using the standard protocol during bolus administration of intravenous contrast. Multiplanar CT image reconstructions and MIPs were obtained to evaluate the vascular anatomy. Carotid stenosis measurements (when applicable) are obtained utilizing NASCET criteria, using the distal internal carotid diameter as the denominator. RADIATION DOSE REDUCTION: This exam was performed according to the departmental dose-optimization program which includes automated exposure control, adjustment of the mA and/or kV according to patient size and/or use of iterative reconstruction technique. CONTRAST:  71mL OMNIPAQUE IOHEXOL 350 MG/ML SOLN COMPARISON:  No prior CT or CTA, correlation is made with 02/26/2022 MRI head and 01/28/2022 CT chest FINDINGS: CT HEAD FINDINGS Brain:  Linear hypodensity in the left cerebellum (series 14, image 7) correlates with the acute infarcts seen on the same-day MRI. No additional acute infarct. The known extra-axial mass along the right frontoparietal convexity is better seen on the same-day MRI (series 14, image 25). The known right posterior temporal lobe lesion is not appreciated on CT. No acute hemorrhage, mass effect, or midline shift. No hydrocephalus or extra-axial fluid collection. Vascular: No hyperdense vessel. Skull: Normal. Negative for fracture or focal lesion. Sinuses/Orbits: No acute finding. Status post bilateral lens replacements. Other: The mastoid air cells are well aerated. CTA NECK FINDINGS Aortic arch: Standard branching. Imaged portion shows no evidence of aneurysm or dissection. No significant stenosis of the major arch vessel origins. Right carotid system: No evidence of dissection, occlusion, or hemodynamically significant stenosis (greater than 50%). Left carotid system: No evidence of dissection, occlusion, or hemodynamically significant stenosis (greater than 50%). Vertebral arteries: No evidence of dissection, occlusion, or hemodynamically significant stenosis (greater than 50%). Skeleton: Redemonstrated sclerotic lesion in the left aspect T5, as seen on the 01/28/2022 study. A punctate lesion in the left aspect of T1 is also noted, unchanged. Hyperdense focus in the left lateral mass of C2 (series 8, image 180), bone island versus additional sclerotic lesion. Other neck: Negative. Upper chest: Scattered nodular opacities in the superior left lower lobe (series 6, image 1), which appears similar to 01/28/2022. No pleural effusion. Review of the MIP images confirms the above findings CTA HEAD FINDINGS Anterior circulation: Both internal carotid arteries are patent to the termini, without significant stenosis. A1 segments patent. Normal anterior communicating artery. Anterior cerebral arteries are patent to their distal aspects.  No M1 stenosis or occlusion. MCA branches perfused and symmetric. Posterior circulation: Vertebral arteries patent to the vertebrobasilar junction without stenosis. Posterior inferior cerebellar arteries patent proximally. Basilar patent to its distal aspect. Superior cerebellar arteries patent proximally. Patent P1 segments, diminutive on right. Near fetal origin of the right PCA from a prominent right posterior communicating artery. PCAs perfused to their distal aspects without stenosis. The left posterior communicating artery is not visualized. Venous sinuses: As permitted by contrast timing, patent. Anatomic variants: Near fetal origin of the right PCA. Review of the MIP images confirms the above findings IMPRESSION: 1. No intracranial large vessel occlusion or significant stenosis. 2. No hemodynamically significant stenosis in the neck. 3. Scattered nodular opacities in the superior left lower lobe, similar to 01/28/2022, which may be infectious or inflammatory. 4. Redemonstrated  sclerotic lesion in the left aspect of T5, as well as a punctate lesion in the left aspect of T1. Additional sclerotic focus in the left lateral mass of C2, concerning for an additional site of osseous metastatic disease. 5. Linear hypodensity in the left cerebellum correlates with the acute infarcts seen on the same-day MRI. No additional acute intracranial process. Electronically Signed   By: Merilyn Baba M.D.   On: 02/26/2022 18:48   MR Brain W and Wo Contrast  Result Date: 02/26/2022 CLINICAL DATA:  Brain/CNS neoplasm, assess treatment response EXAM: MRI HEAD WITHOUT AND WITH CONTRAST TECHNIQUE: Multiplanar, multiecho pulse sequences of the brain and surrounding structures were obtained without and with intravenous contrast. CONTRAST:  25mL GADAVIST GADOBUTROL 1 MMOL/ML IV SOLN COMPARISON:  MRI August 14, 2020. FINDINGS: Brain: Similar size of an extra-axial dural-based lesion along the right frontoparietal convexity, which  measures 1.4 cm on series 16, image 143. Similar versus slightly decreased conspicuity of a tiny lesion in the posterior temporal lobe (series 16, image 104) with faint susceptibility artifact (series 11, image 33). Otherwise, no abnormal enhancement. No new lesions identified. 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 Slight edema but no mass effect. No evidence of acute hemorrhage or midline shift. No hydrocephalus. Vascular: Major arterial flow voids are maintained at the skull base. Skull and upper cervical spine: Normal marrow signal. Sinuses/Orbits: Clear sinuses.  No acute orbital findings. Other: No mastoid effusions. IMPRESSION: 1. 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 Slight edema but no mass effect. 2. Similar versus slightly decreased conspicuity of a tiny treated lesion the posterior right temporal lobe. No progressive disease. 3. Unchanged extra-axial 1.4 cm lesion along the right chronic temporal convexity, probably a meningioma. Electronically Signed   By: Margaretha Sheffield M.D.   On: 02/26/2022 15:07   DG Chest Port 1 View  Result Date: 02/26/2022 CLINICAL DATA:  Generalized weakness EXAM: PORTABLE CHEST 1 VIEW COMPARISON:  06/30/2020 FINDINGS: Cardiac size is within normal limits. Low position of diaphragms suggests COPD. Small to moderate left pleural effusion is seen. There is interval removal of left chest tube. Increased interstitial markings are seen in left lower lung field. There is no focal consolidation. There is no significant right pleural effusion. There is no pneumothorax. IMPRESSION: Small to moderate left pleural effusion. Increased markings in left lower lung fields suggest possible interstitial pneumonia. Electronically Signed   By: Elmer Picker M.D.   On: 02/26/2022 14:14    Assessment/Plan 1. Cerebellar stroke, acute [facility]) Did Dual therapy for 20 days Then Baby aspirin Cardiac monitor for 30 days  2. Orthostatic hypotension Will  continue Low dose of Midodrine   3. Malignant neoplasm of upper lobe of left lung ([place]) on Palliative therapy per oncology  4. Tachycardia Will continue Low dose of lopressor with hold parameter  5. Mixed hyperlipidemia On statin Will need follow op of her LDL  6. Anemia, chronic disease Hgb is good level Per oncology needs Labs q weekly  7. Bilateral leg edema Dopplers in the hospital were negative TED hose  8. Essential hypertension Off all meds now 9 Thrombocytopenia Results faxed to Oncology She is going to [place] [facility] for blood test next week   Family/ staff Communication:   Labs/tests ordered:

## 2022-03-29 DIAGNOSIS — R2689 Other abnormalities of gait and mobility: Secondary | ICD-10-CM | POA: Diagnosis not present

## 2022-03-29 DIAGNOSIS — R278 Other lack of coordination: Secondary | ICD-10-CM | POA: Diagnosis not present

## 2022-03-29 DIAGNOSIS — I639 Cerebral infarction, unspecified: Secondary | ICD-10-CM | POA: Diagnosis not present

## 2022-03-29 DIAGNOSIS — M6281 Muscle weakness (generalized): Secondary | ICD-10-CM | POA: Diagnosis not present

## 2022-03-29 DIAGNOSIS — R2681 Unsteadiness on feet: Secondary | ICD-10-CM | POA: Diagnosis not present

## 2022-03-29 DIAGNOSIS — I63442 Cerebral infarction due to embolism of left cerebellar artery: Secondary | ICD-10-CM | POA: Diagnosis not present

## 2022-03-31 DIAGNOSIS — R2681 Unsteadiness on feet: Secondary | ICD-10-CM | POA: Diagnosis not present

## 2022-03-31 DIAGNOSIS — I639 Cerebral infarction, unspecified: Secondary | ICD-10-CM | POA: Diagnosis not present

## 2022-03-31 DIAGNOSIS — R278 Other lack of coordination: Secondary | ICD-10-CM | POA: Diagnosis not present

## 2022-03-31 DIAGNOSIS — M6281 Muscle weakness (generalized): Secondary | ICD-10-CM | POA: Diagnosis not present

## 2022-03-31 DIAGNOSIS — R2689 Other abnormalities of gait and mobility: Secondary | ICD-10-CM | POA: Diagnosis not present

## 2022-03-31 DIAGNOSIS — I63442 Cerebral infarction due to embolism of left cerebellar artery: Secondary | ICD-10-CM | POA: Diagnosis not present

## 2022-04-01 ENCOUNTER — Encounter: Payer: Self-pay | Admitting: Internal Medicine

## 2022-04-01 ENCOUNTER — Other Ambulatory Visit (HOSPITAL_COMMUNITY): Payer: Self-pay

## 2022-04-01 DIAGNOSIS — I639 Cerebral infarction, unspecified: Secondary | ICD-10-CM | POA: Diagnosis not present

## 2022-04-01 DIAGNOSIS — R2689 Other abnormalities of gait and mobility: Secondary | ICD-10-CM | POA: Diagnosis not present

## 2022-04-01 DIAGNOSIS — M6281 Muscle weakness (generalized): Secondary | ICD-10-CM | POA: Diagnosis not present

## 2022-04-01 DIAGNOSIS — I63442 Cerebral infarction due to embolism of left cerebellar artery: Secondary | ICD-10-CM | POA: Diagnosis not present

## 2022-04-01 DIAGNOSIS — R29898 Other symptoms and signs involving the musculoskeletal system: Secondary | ICD-10-CM | POA: Diagnosis not present

## 2022-04-01 DIAGNOSIS — R278 Other lack of coordination: Secondary | ICD-10-CM | POA: Diagnosis not present

## 2022-04-01 DIAGNOSIS — R2681 Unsteadiness on feet: Secondary | ICD-10-CM | POA: Diagnosis not present

## 2022-04-02 DIAGNOSIS — R2681 Unsteadiness on feet: Secondary | ICD-10-CM | POA: Diagnosis not present

## 2022-04-02 DIAGNOSIS — R2689 Other abnormalities of gait and mobility: Secondary | ICD-10-CM | POA: Diagnosis not present

## 2022-04-02 DIAGNOSIS — M6281 Muscle weakness (generalized): Secondary | ICD-10-CM | POA: Diagnosis not present

## 2022-04-02 DIAGNOSIS — I639 Cerebral infarction, unspecified: Secondary | ICD-10-CM | POA: Diagnosis not present

## 2022-04-02 DIAGNOSIS — R278 Other lack of coordination: Secondary | ICD-10-CM | POA: Diagnosis not present

## 2022-04-02 DIAGNOSIS — R29898 Other symptoms and signs involving the musculoskeletal system: Secondary | ICD-10-CM | POA: Diagnosis not present

## 2022-04-02 DIAGNOSIS — R4789 Other speech disturbances: Secondary | ICD-10-CM | POA: Diagnosis not present

## 2022-04-02 DIAGNOSIS — I63442 Cerebral infarction due to embolism of left cerebellar artery: Secondary | ICD-10-CM | POA: Diagnosis not present

## 2022-04-02 MED FILL — Fosaprepitant Dimeglumine For IV Infusion 150 MG (Base Eq): INTRAVENOUS | Qty: 5 | Status: AC

## 2022-04-02 MED FILL — Dexamethasone Sodium Phosphate Inj 100 MG/10ML: INTRAMUSCULAR | Qty: 1 | Status: AC

## 2022-04-03 ENCOUNTER — Ambulatory Visit: Payer: Medicare PPO | Admitting: Physician Assistant

## 2022-04-03 ENCOUNTER — Inpatient Hospital Stay: Payer: Medicare PPO

## 2022-04-03 ENCOUNTER — Other Ambulatory Visit: Payer: Self-pay

## 2022-04-03 ENCOUNTER — Inpatient Hospital Stay (HOSPITAL_BASED_OUTPATIENT_CLINIC_OR_DEPARTMENT_OTHER): Payer: Medicare PPO | Admitting: Internal Medicine

## 2022-04-03 ENCOUNTER — Encounter: Payer: Self-pay | Admitting: Internal Medicine

## 2022-04-03 VITALS — BP 134/72 | HR 75 | Temp 97.8°F | Resp 18

## 2022-04-03 VITALS — BP 161/80 | HR 78 | Temp 98.2°F | Resp 14 | Wt 101.9 lb

## 2022-04-03 DIAGNOSIS — C782 Secondary malignant neoplasm of pleura: Secondary | ICD-10-CM | POA: Diagnosis not present

## 2022-04-03 DIAGNOSIS — C3490 Malignant neoplasm of unspecified part of unspecified bronchus or lung: Secondary | ICD-10-CM | POA: Diagnosis not present

## 2022-04-03 DIAGNOSIS — C3412 Malignant neoplasm of upper lobe, left bronchus or lung: Secondary | ICD-10-CM

## 2022-04-03 DIAGNOSIS — Z79899 Other long term (current) drug therapy: Secondary | ICD-10-CM | POA: Diagnosis not present

## 2022-04-03 DIAGNOSIS — C3492 Malignant neoplasm of unspecified part of left bronchus or lung: Secondary | ICD-10-CM

## 2022-04-03 DIAGNOSIS — R590 Localized enlarged lymph nodes: Secondary | ICD-10-CM | POA: Diagnosis not present

## 2022-04-03 DIAGNOSIS — Z8673 Personal history of transient ischemic attack (TIA), and cerebral infarction without residual deficits: Secondary | ICD-10-CM | POA: Diagnosis not present

## 2022-04-03 DIAGNOSIS — C3432 Malignant neoplasm of lower lobe, left bronchus or lung: Secondary | ICD-10-CM | POA: Diagnosis not present

## 2022-04-03 DIAGNOSIS — C778 Secondary and unspecified malignant neoplasm of lymph nodes of multiple regions: Secondary | ICD-10-CM | POA: Diagnosis not present

## 2022-04-03 DIAGNOSIS — Z5111 Encounter for antineoplastic chemotherapy: Secondary | ICD-10-CM | POA: Diagnosis not present

## 2022-04-03 LAB — CMP (CANCER CENTER ONLY)
ALT: 16 U/L (ref 0–44)
AST: 21 U/L (ref 15–41)
Albumin: 3.4 g/dL — ABNORMAL LOW (ref 3.5–5.0)
Alkaline Phosphatase: 75 U/L (ref 38–126)
Anion gap: 9 (ref 5–15)
BUN: 22 mg/dL (ref 8–23)
CO2: 22 mmol/L (ref 22–32)
Calcium: 10.2 mg/dL (ref 8.9–10.3)
Chloride: 104 mmol/L (ref 98–111)
Creatinine: 0.93 mg/dL (ref 0.44–1.00)
GFR, Estimated: 59 mL/min — ABNORMAL LOW (ref >60)
Glucose, Bld: 116 mg/dL — ABNORMAL HIGH (ref 70–99)
Potassium: 4.1 mmol/L (ref 3.5–5.1)
Sodium: 135 mmol/L (ref 135–145)
Total Bilirubin: 0.4 mg/dL (ref 0.3–1.2)
Total Protein: 6.3 g/dL — ABNORMAL LOW (ref 6.5–8.1)

## 2022-04-03 LAB — CBC WITH DIFFERENTIAL (CANCER CENTER ONLY)
Abs Immature Granulocytes: 0.04 10*3/uL (ref 0.00–0.07)
Basophils Absolute: 0 10*3/uL (ref 0.0–0.1)
Basophils Relative: 0 %
Eosinophils Absolute: 0 10*3/uL (ref 0.0–0.5)
Eosinophils Relative: 0 %
HCT: 30.9 % — ABNORMAL LOW (ref 36.0–46.0)
Hemoglobin: 10.8 g/dL — ABNORMAL LOW (ref 12.0–15.0)
Immature Granulocytes: 1 %
Lymphocytes Relative: 7 %
Lymphs Abs: 0.4 10*3/uL — ABNORMAL LOW (ref 0.7–4.0)
MCH: 31.7 pg (ref 26.0–34.0)
MCHC: 35 g/dL (ref 30.0–36.0)
MCV: 90.6 fL (ref 80.0–100.0)
Monocytes Absolute: 1 10*3/uL (ref 0.1–1.0)
Monocytes Relative: 20 %
Neutro Abs: 3.6 10*3/uL (ref 1.7–7.7)
Neutrophils Relative %: 72 %
Platelet Count: 237 10*3/uL (ref 150–400)
RBC: 3.41 MIL/uL — ABNORMAL LOW (ref 3.87–5.11)
RDW: 17.1 % — ABNORMAL HIGH (ref 11.5–15.5)
WBC Count: 5 10*3/uL (ref 4.0–10.5)
nRBC: 0 % (ref 0.0–0.2)

## 2022-04-03 LAB — TSH: TSH: 1.675 u[IU]/mL (ref 0.350–4.500)

## 2022-04-03 LAB — TOTAL PROTEIN, URINE DIPSTICK: Protein, ur: 30 mg/dL — AB

## 2022-04-03 MED ORDER — SODIUM CHLORIDE 0.9 % IV SOLN
150.0000 mg | Freq: Once | INTRAVENOUS | Status: AC
  Administered 2022-04-03: 150 mg via INTRAVENOUS
  Filled 2022-04-03: qty 150

## 2022-04-03 MED ORDER — SODIUM CHLORIDE 0.9 % IV SOLN
10.0000 mg | Freq: Once | INTRAVENOUS | Status: AC
  Administered 2022-04-03: 10 mg via INTRAVENOUS
  Filled 2022-04-03: qty 10

## 2022-04-03 MED ORDER — SODIUM CHLORIDE 0.9 % IV SOLN
375.0000 mg/m2 | Freq: Once | INTRAVENOUS | Status: AC
  Administered 2022-04-03: 500 mg via INTRAVENOUS
  Filled 2022-04-03: qty 20

## 2022-04-03 MED ORDER — SODIUM CHLORIDE 0.9 % IV SOLN: Freq: Once | INTRAVENOUS | Status: AC

## 2022-04-03 MED ORDER — PALONOSETRON HCL INJECTION 0.25 MG/5ML
0.2500 mg | Freq: Once | INTRAVENOUS | Status: AC
  Administered 2022-04-03: 0.25 mg via INTRAVENOUS
  Filled 2022-04-03: qty 5

## 2022-04-03 MED ORDER — SODIUM CHLORIDE 0.9 % IV SOLN
245.5000 mg | Freq: Once | INTRAVENOUS | Status: AC
  Administered 2022-04-03: 250 mg via INTRAVENOUS
  Filled 2022-04-03: qty 25

## 2022-04-03 MED ORDER — CYANOCOBALAMIN 1000 MCG/ML IJ SOLN
1000.0000 ug | Freq: Once | INTRAMUSCULAR | Status: AC
  Administered 2022-04-03: 1000 ug via INTRAMUSCULAR
  Filled 2022-04-03: qty 1

## 2022-04-03 NOTE — Patient Instructions (Signed)
[facility]  Discharge Instructions: Thank you for choosing [facility] to provide your oncology and hematology care.   If you have a lab appointment with the [facility], please go directly to the [facility] and check in at the registration area.   Wear comfortable clothing and clothing appropriate for easy access to any Portacath or PICC line.   We strive to give you quality time with your provider. You may need to reschedule your appointment if you arrive late (15 or more minutes).  Arriving late affects you and other patients whose appointments are after yours.  Also, if you miss three or more appointments without notifying the office, you may be dismissed from the clinic at the provider's discretion.      For prescription refill requests, have your pharmacy contact our office and allow 72 hours for refills to be completed.    Today you received the following chemotherapy and/or immunotherapy agents: Alimta and Carboplatin      To help prevent nausea and vomiting after your treatment, we encourage you to take your nausea medication as directed.  BELOW ARE SYMPTOMS THAT SHOULD BE REPORTED IMMEDIATELY: *FEVER GREATER THAN 100.4 F (38 C) OR HIGHER *CHILLS OR SWEATING *NAUSEA AND VOMITING THAT IS NOT CONTROLLED WITH YOUR NAUSEA MEDICATION *UNUSUAL SHORTNESS OF BREATH *UNUSUAL BRUISING OR BLEEDING *URINARY PROBLEMS (pain or burning when urinating, or frequent urination) *BOWEL PROBLEMS (unusual diarrhea, constipation, pain near the anus) TENDERNESS IN MOUTH AND THROAT WITH OR WITHOUT PRESENCE OF ULCERS (sore throat, sores in mouth, or a toothache) UNUSUAL RASH, SWELLING OR PAIN  UNUSUAL VAGINAL DISCHARGE OR ITCHING   Items with * indicate a potential emergency and should be followed up as soon as possible or go to the Emergency Department if any problems should occur.  Please show the CHEMOTHERAPY ALERT CARD or IMMUNOTHERAPY ALERT CARD at  check-in to the Emergency Department and triage nurse.  Should you have questions after your visit or need to cancel or reschedule your appointment, please contact [facility]  Dept: [phone]  and follow the prompts.  Office hours are 8:00 a.m. to 4:30 p.m. Monday - Friday. Please note that voicemails left after 4:00 p.m. may not be returned until the following business day.  We are closed weekends and major holidays. You have access to a nurse at all times for urgent questions. Please call the main number to the clinic Dept: [phone] and follow the prompts.   For any non-urgent questions, you may also contact your provider using MyChart. We now offer e-Visits for anyone 97 and older to request care online for non-urgent symptoms. For details visit [email or web address].   Also download the MyChart app! Go to the app store, search "MyChart", open the app, select [facility], and log in with your MyChart username and password.  Masks are optional in the cancer centers. If you would like for your care team to wear a mask while they are taking care of you, please let them know. You may have one support person who is at least 86 years old accompany you for your appointments.

## 2022-04-03 NOTE — Progress Notes (Signed)
[facility] [phone]   [phone]  OFFICE PROGRESS NOTE  [name], MD [place] [address]  DIAGNOSIS: Stage IV (T2b, N2, M1c), non-small cell lung cancer, adenocarcinoma.  The patient presented with a left upper lobe mass, right supra clavicular, left hilar and subcarinal lymphadenopathy as well as diffuse left-sided pleural disease with associated malignant left pleural effusion and 2 small lymph nodes near the SMA in addition to multiple brain metastasis diagnosed in December 2021.   Molecular studies by [facility] showed positive EGFR mutation with deletion in exon 19  PRIOR THERAPY: None   CURRENT THERAPY: 1) palliative systemic chemotherapy with carboplatin for an AUC of 5 Alimta 500 mg/m2 and Avastin IV every 3 weeks.  First dose on 02/20/22. Avastin discontinued starting from cycle #2 due to recent small CVA.  2) Tagrisso 80 mg p.o. daily. First dose June 10, 2020.  Status post 22 months of treatment.  Starting October 2022 the patient is treated with Tagrisso 40 mg p.o. daily because of the intolerance. The patient had progression in September 2023 but continues to take this due to hx of metastatic disease to the brain.   INTERVAL HISTORY: Shelley Flores 88 y.o. female returns to the clinic today for follow-up visit.  The patient is feeling fine today with no concerning complaints.  She has been tolerating her treatment with chemotherapy in addition to [place] fairly well.  She denied having any chest pain, shortness of breath, cough or hemoptysis.  She has no nausea, vomiting, diarrhea or constipation.  She has no headache or visual changes.  She is here today for evaluation before starting cycle #3.   MEDICAL HISTORY: Past Medical History:  Diagnosis Date   Bruit    Abdominal bruit - Abdominal aorta/renal duplex Doppler evaluation 12/07/03 -Mildly abnormal evaluation. 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 This is consistant w/median arcuate ligament compression syndrome. *Right & Left Kidney: Essentially equal in size, symmetrical in shape w/no significant abnormalities visualized. *Right & Left Renal Arteries: No significant abnormalities.   Cancer ([facility])    Edema extremities    LE edema    H/O myocardial perfusion scan 02/20/00   To rule out ischemia - Negative adequate Bruce protocol exercise stress test with a deconditioned exercise response and normal static myocardial perfusion images with EF calculated by QGS of 77%. Represents a low risk study.   Hyperlipidemia    Hypertension    Osteoarthritis    Osteoporosis    Pleural effusion 04/2020   Valvular heart disease    Mild valvular heart disease by Echo in 2010, all mild and symptomatic, including concentric LVH, MR, TR, and AI with pulmonary artery pressure of 32. EF was normal.    ALLERGIES:  is allergic to irbesartan.  MEDICATIONS:  Current Outpatient Medications  Medication Sig Dispense Refill   acetaminophen (TYLENOL) 325 MG tablet Take 2 tablets (650 mg total) by mouth every 6 (six) hours as needed for mild pain (or Fever >/= 101). 20 tablet 0   aspirin 81 MG tablet Take 81 mg by mouth daily.     atorvastatin (LIPITOR) 20 MG tablet TAKE 1 TABLET BY MOUTH EVERYDAY AT BEDTIME (Patient taking differently: Take 20 mg by mouth daily.) 90 tablet 3   dexamethasone (DECADRON) 4 MG tablet Take 4 mg by mouth as directed. Take one tablet bid the day before ,the day of and the day after chemotherapy to  prevent nausea.     folic acid (FOLVITE) 1 MG tablet Take 1 tablet (1 mg total) by mouth daily. 30 tablet 2   magnesium hydroxide (MILK OF MAGNESIA) 400 MG/5ML suspension Take 15 mLs by mouth daily as needed for mild constipation.     metoprolol tartrate (LOPRESSOR) 25 MG tablet Take 12.5 mg by mouth 2 (two) times daily.     midodrine (PROAMATINE) 2.5 MG tablet Take 1 tablet (2.5 mg total) by mouth 2 (two) times daily as  needed (if sbp <100 on standing.). 4 tablet 0   osimertinib mesylate (TAGRISSO) 40 MG tablet Take 1 tablet (40 mg total) by mouth daily. 30 tablet 2   Povidone, PF, (IVIZIA DRY EYES) 0.5 % SOLN Place 1 drop into both eyes daily.     prochlorperazine (COMPAZINE) 10 MG tablet Take 1 tablet (10 mg total) by mouth every 6 (six) hours as needed. 30 tablet 2   No current facility-administered medications for this visit.    SURGICAL HISTORY:  Past Surgical History:  Procedure Laterality Date   Abdominal aorta/Renal duplex Doppler Evaluation  12/07/03   For abdominal bruit. Mildly abnormal evaluation. (See bruit in medical history)   BREAST EXCISIONAL BIOPSY Left 1966   BREAST EXCISIONAL BIOPSY Left 1999   CATARACT EXTRACTION  2003   CHEST TUBE INSERTION N/A 07/04/2020   Procedure: REMOVAL PLEURAL DRAINAGE CATHETER;  Surgeon: Garner Nash, DO;  Location: [facility];  Service: Pulmonary;  Laterality: N/A;   COLONOSCOPY  10/22/2004   DEXA Bone Scan  06/23/2013   IR PERC PLEURAL DRAIN W/INDWELL CATH W/IMG GUIDE  05/10/2020   IR THORACENTESIS ASP PLEURAL SPACE W/IMG GUIDE  05/04/2020    REVIEW OF SYSTEMS:  A comprehensive review of systems was negative except for: Constitutional: positive for fatigue   PHYSICAL EXAMINATION: General appearance: alert, cooperative, and no distress Head: Normocephalic, without obvious abnormality, atraumatic Neck: no adenopathy, no JVD, supple, symmetrical, trachea midline, and thyroid not enlarged, symmetric, no tenderness/mass/nodules Lymph nodes: Cervical, supraclavicular, and axillary nodes normal. Resp: clear to auscultation bilaterally Back: symmetric, no curvature. ROM normal. No CVA tenderness. Cardio: regular rate and rhythm, S1, S2 normal, no murmur, click, rub or gallop GI: soft, non-tender; bowel sounds normal; no masses,  no organomegaly Extremities: extremities normal, atraumatic, no cyanosis or edema  ECOG PERFORMANCE STATUS: 1 - Symptomatic but  completely ambulatory  Blood pressure (!) 161/80, pulse 78, temperature 98.2 F (36.8 C), temperature source Oral, resp. rate 14, weight 101 lb 14.4 oz (46.2 kg), SpO2 97 %.  LABORATORY DATA: Lab Results  Component Value Date   WBC 5.0 04/03/2022   HGB 10.8 (L) 04/03/2022   HCT 30.9 (L) 04/03/2022   MCV 90.6 04/03/2022   PLT 237 04/03/2022      Chemistry      Component Value Date/Time   NA 135 04/03/2022 1027   NA 136 (A) 03/28/2022 0000   K 4.1 04/03/2022 1027   CL 104 04/03/2022 1027   CO2 22 04/03/2022 1027   BUN 22 04/03/2022 1027   BUN 15 03/28/2022 0000   CREATININE 0.93 04/03/2022 1027   CREATININE 1.22 (H) 05/21/2021 0800   GLU 85 03/28/2022 0000      Component Value Date/Time   CALCIUM 10.2 04/03/2022 1027   ALKPHOS 75 04/03/2022 1027   AST 21 04/03/2022 1027   ALT 16 04/03/2022 1027   BILITOT 0.4 04/03/2022 1027       RADIOGRAPHIC STUDIES: No results found.   ASSESSMENT AND  PLAN: This is a very pleasant 88 years old white female recently diagnosed with a stage IV (T3, N3, M1 C) non-small cell lung cancer, adenocarcinoma with positive EGFR mutation with deletion in exon 19 diagnosed in December 2021 and presented with left lower lobe lung mass in addition to right supraclavicular, left hilar and subcarinal lymphadenopathy as well as metastatic lymphadenopathy in the abdomen and left pleural-based metastasis as well as malignant left pleural effusion and multiple brain metastasis. The molecular study showed positive EGFR mutation with deletion in exon 19. The patient started treatment with Tagrisso 80 mg p.o. daily on June 10, 2020.  Starting from October 2022 her dose has changed to 40 mg p.o. daily.  She is status post a total of 22 months of treatment. She had evidence for disease progression and the patient started systemic chemotherapy initially with carboplatin for AUC of 5, Alimta 500 Mg/M2 and Avastin 15 Mg/KG every 3 weeks status post 2 cycles.   Avastin was discontinued after cycle #1 secondary to suspicious stroke.  The patient tolerated the second cycle of her treatment fairly well with no concerning adverse effects. I recommended for her to proceed with cycle #3 today as planned. I will see her back for follow-up visit in 3 weeks for evaluation with repeat CT scan of the chest for restaging of her disease. The patient was advised to call immediately if she has any other concerning symptoms in the interval. The patient voices understanding of current disease status and treatment options and is in agreement with the current care plan.  All questions were answered. The patient knows to call the clinic with any problems, questions or concerns. We can certainly see the patient much sooner if necessary.   Disclaimer: This note was dictated with voice recognition software. Similar sounding words can inadvertently be transcribed and may not be corrected upon review.

## 2022-04-05 DIAGNOSIS — I63442 Cerebral infarction due to embolism of left cerebellar artery: Secondary | ICD-10-CM | POA: Diagnosis not present

## 2022-04-05 DIAGNOSIS — R29898 Other symptoms and signs involving the musculoskeletal system: Secondary | ICD-10-CM | POA: Diagnosis not present

## 2022-04-05 DIAGNOSIS — I639 Cerebral infarction, unspecified: Secondary | ICD-10-CM | POA: Diagnosis not present

## 2022-04-05 DIAGNOSIS — M6281 Muscle weakness (generalized): Secondary | ICD-10-CM | POA: Diagnosis not present

## 2022-04-08 DIAGNOSIS — M6281 Muscle weakness (generalized): Secondary | ICD-10-CM | POA: Diagnosis not present

## 2022-04-08 DIAGNOSIS — I63442 Cerebral infarction due to embolism of left cerebellar artery: Secondary | ICD-10-CM | POA: Diagnosis not present

## 2022-04-08 DIAGNOSIS — I639 Cerebral infarction, unspecified: Secondary | ICD-10-CM | POA: Diagnosis not present

## 2022-04-08 DIAGNOSIS — R29898 Other symptoms and signs involving the musculoskeletal system: Secondary | ICD-10-CM | POA: Diagnosis not present

## 2022-04-09 DIAGNOSIS — R2689 Other abnormalities of gait and mobility: Secondary | ICD-10-CM | POA: Diagnosis not present

## 2022-04-09 DIAGNOSIS — M6281 Muscle weakness (generalized): Secondary | ICD-10-CM | POA: Diagnosis not present

## 2022-04-09 DIAGNOSIS — R278 Other lack of coordination: Secondary | ICD-10-CM | POA: Diagnosis not present

## 2022-04-09 DIAGNOSIS — I63442 Cerebral infarction due to embolism of left cerebellar artery: Secondary | ICD-10-CM | POA: Diagnosis not present

## 2022-04-09 DIAGNOSIS — I639 Cerebral infarction, unspecified: Secondary | ICD-10-CM | POA: Diagnosis not present

## 2022-04-09 DIAGNOSIS — R2681 Unsteadiness on feet: Secondary | ICD-10-CM | POA: Diagnosis not present

## 2022-04-10 ENCOUNTER — Inpatient Hospital Stay: Payer: Medicare PPO

## 2022-04-10 DIAGNOSIS — R278 Other lack of coordination: Secondary | ICD-10-CM | POA: Diagnosis not present

## 2022-04-10 DIAGNOSIS — M6281 Muscle weakness (generalized): Secondary | ICD-10-CM | POA: Diagnosis not present

## 2022-04-10 DIAGNOSIS — I639 Cerebral infarction, unspecified: Secondary | ICD-10-CM | POA: Diagnosis not present

## 2022-04-10 DIAGNOSIS — R2689 Other abnormalities of gait and mobility: Secondary | ICD-10-CM | POA: Diagnosis not present

## 2022-04-10 DIAGNOSIS — R29898 Other symptoms and signs involving the musculoskeletal system: Secondary | ICD-10-CM | POA: Diagnosis not present

## 2022-04-10 DIAGNOSIS — R2681 Unsteadiness on feet: Secondary | ICD-10-CM | POA: Diagnosis not present

## 2022-04-10 DIAGNOSIS — I63442 Cerebral infarction due to embolism of left cerebellar artery: Secondary | ICD-10-CM | POA: Diagnosis not present

## 2022-04-11 ENCOUNTER — Ambulatory Visit: Payer: Medicare PPO | Admitting: Nurse Practitioner

## 2022-04-11 DIAGNOSIS — I63442 Cerebral infarction due to embolism of left cerebellar artery: Secondary | ICD-10-CM | POA: Diagnosis not present

## 2022-04-11 DIAGNOSIS — R278 Other lack of coordination: Secondary | ICD-10-CM | POA: Diagnosis not present

## 2022-04-11 DIAGNOSIS — M6281 Muscle weakness (generalized): Secondary | ICD-10-CM | POA: Diagnosis not present

## 2022-04-11 DIAGNOSIS — R2689 Other abnormalities of gait and mobility: Secondary | ICD-10-CM | POA: Diagnosis not present

## 2022-04-11 DIAGNOSIS — R2681 Unsteadiness on feet: Secondary | ICD-10-CM | POA: Diagnosis not present

## 2022-04-11 DIAGNOSIS — D649 Anemia, unspecified: Secondary | ICD-10-CM | POA: Diagnosis not present

## 2022-04-11 DIAGNOSIS — I639 Cerebral infarction, unspecified: Secondary | ICD-10-CM | POA: Diagnosis not present

## 2022-04-12 ENCOUNTER — Non-Acute Institutional Stay: Payer: Medicare PPO | Admitting: Orthopedic Surgery

## 2022-04-12 ENCOUNTER — Encounter: Payer: Self-pay | Admitting: Orthopedic Surgery

## 2022-04-12 ENCOUNTER — Encounter: Payer: Self-pay | Admitting: Internal Medicine

## 2022-04-12 DIAGNOSIS — I639 Cerebral infarction, unspecified: Secondary | ICD-10-CM | POA: Diagnosis not present

## 2022-04-12 DIAGNOSIS — E871 Hypo-osmolality and hyponatremia: Secondary | ICD-10-CM

## 2022-04-12 DIAGNOSIS — I63442 Cerebral infarction due to embolism of left cerebellar artery: Secondary | ICD-10-CM | POA: Diagnosis not present

## 2022-04-12 DIAGNOSIS — M6281 Muscle weakness (generalized): Secondary | ICD-10-CM | POA: Diagnosis not present

## 2022-04-12 DIAGNOSIS — R110 Nausea: Secondary | ICD-10-CM

## 2022-04-12 DIAGNOSIS — C3412 Malignant neoplasm of upper lobe, left bronchus or lung: Secondary | ICD-10-CM

## 2022-04-12 DIAGNOSIS — R29898 Other symptoms and signs involving the musculoskeletal system: Secondary | ICD-10-CM | POA: Diagnosis not present

## 2022-04-12 NOTE — Progress Notes (Signed)
Location:  [facility] Room Number: [place] of Service:  [id number] [phone]) Provider:  Windell Moulding, NP   Patient Care Team: [name], MD as PCP - General (Internal Medicine) Pixie Casino, MD as PCP - Cardiology (Cardiology) Valrie Hart, RN as Oncology Nurse Navigator (Oncology)  Extended Emergency Contact Information Primary Emergency Contact: Smithwick,Pat Home Phone: [phone] Mobile Phone: [phone] Relation: Friend Secondary Emergency Contact: Genice Rouge Address: [place], [place] [address] [place] Phone: [phone] Work Phone: [phone] Relation: Other  Code Status:  DNR Goals of care: Advanced Directive information    04/12/2022    1:27 PM  Advanced Directives  Does Patient Have a Medical Advance Directive? Yes  Type of Paramedic of [place];Living will;Out of facility DNR (pink MOST or yellow form)  Does patient want to make changes to medical advance directive? No - Patient declined  Copy of [place] in Chart? Yes - validated most recent copy scanned in chart (See row information)     Chief Complaint  Patient presents with   Acute Visit    Acute Visit with nausea as provider is on site at the facility.     Quality Metric Gaps    Needs to Discuss Shingrix Vaccine.      HPI:  [name] seen today for an acute visit for nausea.   She currently resides in assisted living at [facility]. PMH: HTN, edema to BLE, metastatic lung cancer, GERD, osteoporosis, thrombocytopenia, anemia, and unstable gait.   Hospitalized 10/17-10/21 due to acute left cerebellar ischemic stroke. She is followed by oncology for metastatic lung cancer- mets to brain. She is currently receiving palliative systemic chemotherapy with carboplatin, Avastin and Tagrisso- last treatment 04/03/2022.   Today, she reports increased nausea. She is having  trouble drinking and eating. Believes food gets stuck after she swallows. She is followed by speech therapist at this time, remains on regular diet. She vomited her lunch a few days ago. LBM 12/01- moderate. She denies abdominal pain, diarrhea or constipation. She has not used compazine due to drowsiness.   11/30 Na+ 132> was 135 (11/22).   Past Medical History:  Diagnosis Date   Bruit    Abdominal bruit - Abdominal aorta/renal duplex Doppler evaluation 12/07/03 -Mildly abnormal evaluation. 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 This is consistant w/median arcuate ligament compression syndrome. *Right & Left Kidney: Essentially equal in size, symmetrical in shape w/no significant abnormalities visualized. *Right & Left Renal Arteries: No significant abnormalities.   Cancer ([place])    Edema extremities    LE edema    H/O myocardial perfusion scan 02/20/00   To rule out ischemia - Negative adequate Bruce protocol exercise stress test with a deconditioned exercise response and normal static myocardial perfusion images with EF calculated by QGS of 77%. Represents a low risk study.   Hyperlipidemia    Hypertension    Osteoarthritis    Osteoporosis    Pleural effusion 04/2020   Valvular heart disease    Mild valvular heart disease by Echo in 2010, all mild and symptomatic, including concentric LVH, MR, TR, and AI with pulmonary artery pressure of 32. EF was normal.   Past Surgical History:  Procedure Laterality Date   Abdominal aorta/Renal duplex Doppler Evaluation  12/07/03   For abdominal bruit. Mildly abnormal evaluation. (See bruit in medical  history)   BREAST EXCISIONAL BIOPSY Left 1966   BREAST EXCISIONAL BIOPSY Left 1999   CATARACT EXTRACTION  2003   CHEST TUBE INSERTION N/A 07/04/2020   Procedure: REMOVAL PLEURAL DRAINAGE CATHETER;  Surgeon: Garner Nash, DO;  Location: [facility];  Service: Pulmonary;  Laterality: N/A;   COLONOSCOPY  10/22/2004   DEXA Bone Scan   06/23/2013   IR PERC PLEURAL DRAIN W/INDWELL CATH W/IMG GUIDE  05/10/2020   IR THORACENTESIS ASP PLEURAL SPACE W/IMG GUIDE  05/04/2020    Allergies  Allergen Reactions   Irbesartan Nausea Only    Outpatient Encounter Medications as of 04/12/2022  Medication Sig   acetaminophen (TYLENOL) 325 MG tablet Take 2 tablets (650 mg total) by mouth every 6 (six) hours as needed for mild pain (or Fever >/= 101).   aspirin 81 MG tablet Take 81 mg by mouth daily.   atorvastatin (LIPITOR) 20 MG tablet Take 20 mg by mouth at bedtime.   folic acid (FOLVITE) 1 MG tablet Take 1 tablet (1 mg total) by mouth daily.   magnesium hydroxide (MILK OF MAGNESIA) 400 MG/5ML suspension Take 15 mLs by mouth daily as needed for mild constipation.   metoCLOPramide (REGLAN) 5 MG tablet Take 5 mg by mouth 2 (two) times daily. Take it Before meals   metoprolol tartrate (LOPRESSOR) 25 MG tablet Take 12.5 mg by mouth 2 (two) times daily.   midodrine (PROAMATINE) 2.5 MG tablet Take 1 tablet (2.5 mg total) by mouth 2 (two) times daily as needed (if sbp <100 on standing.).   osimertinib mesylate (TAGRISSO) 40 MG tablet Take 1 tablet (40 mg total) by mouth daily.   Povidone, PF, (IVIZIA DRY EYES) 0.5 % SOLN Place 1 drop into both eyes daily.   prochlorperazine (COMPAZINE) 10 MG tablet Take 1 tablet (10 mg total) by mouth every 6 (six) hours as needed.   [DISCONTINUED] atorvastatin (LIPITOR) 20 MG tablet TAKE 1 TABLET BY MOUTH EVERYDAY AT BEDTIME (Patient taking differently: Take 20 mg by mouth at bedtime.)   [DISCONTINUED] dexamethasone (DECADRON) 4 MG tablet Take 4 mg by mouth as directed. Take one tablet bid the day before ,the day of and the day after chemotherapy to prevent nausea.   No facility-administered encounter medications on file as of 04/12/2022.    Review of Systems  Constitutional:  Positive for fatigue. Negative for activity change and appetite change.  HENT:  Positive for trouble swallowing. Negative for  congestion.   Eyes:  Negative for visual disturbance.  Respiratory:  Negative for cough, shortness of breath and wheezing.   Cardiovascular:  Positive for leg swelling. Negative for chest pain.  Gastrointestinal:  Positive for nausea and vomiting. Negative for abdominal pain, constipation and diarrhea.  Genitourinary:  Negative for dysuria.  Musculoskeletal:  Positive for gait problem.  Skin:  Negative for wound.  Neurological:  Positive for weakness. Negative for dizziness and headaches.  Psychiatric/Behavioral:  Positive for confusion. Negative for dysphoric mood. The patient is not nervous/anxious.     Immunization History  Administered Date(s) Administered   Fluad Quad(high Dose 65+) 01/06/2019, 02/08/2020, 02/27/2022   Influenza Split 08/14/2010, 01/16/2012, 02/02/2013, 01/24/2015, 02/01/2016, 02/19/2017, 01/28/2018   Influenza, High Dose Seasonal PF 01/24/2015, 02/01/2016, 01/28/2018, 02/15/2021   Influenza-Unspecified 02/01/2016   MODERNA COVID-19 SARS-COV-2 PEDS BIVALENT BOOSTER 6Y-11Y 10/24/2021   Moderna Covid-19 Vaccine Bivalent Booster 31yrs & up 03/14/2022   Moderna SARS-COV2 Booster Vaccination 10/18/2020   Moderna Sars-Covid-2 Vaccination 06/14/2019, 03/27/2020, 03/27/2020   PFIZER(Purple Top)SARS-COV-2 Vaccination 01/31/2021  Pneumococcal Conjugate-13 08/11/2013, 09/03/2013   Pneumococcal Polysaccharide-23 05/28/2004   Td 07/04/2000   Tdap 07/04/2009, 10/18/2019   Unspecified SARS-COV-2 Vaccination 03/27/2020   Zoster, Live 07/02/2005   Pertinent  Health Maintenance Due  Topic Date Due   INFLUENZA VACCINE  Completed   DEXA SCAN  Completed      03/13/2022   11:59 AM 03/22/2022    3:07 PM 03/26/2022    2:18 PM 04/03/2022   11:04 AM 04/12/2022    1:25 PM  Fall Risk  Falls in the past year?  0 0  0  Was there an injury with Fall?  0 0  0  Fall Risk Category Calculator  0 0  0  Fall Risk Category  Low Low  Low  Patient Fall Risk Level High fall risk High fall  risk High fall risk High fall risk High fall risk  Patient at Risk for Falls Due to  History of fall(s);Impaired balance/gait;Impaired mobility History of fall(s);Impaired balance/gait;Impaired mobility  History of fall(s);Impaired balance/gait;Impaired mobility  Fall risk Follow up  Falls evaluation completed Falls evaluation completed  Falls evaluation completed   Functional Status Survey:    Vitals:   04/12/22 1329  BP: (!) 151/87  Pulse: 73  Resp: 17  Temp: (!) 96.7 F (35.9 C)  SpO2: 97%  Weight: 99 lb 4 oz (45 kg)  Height: 5\' 4"  (1.626 m)   Body mass index is 17.04 kg/m. Physical Exam Vitals reviewed.  Constitutional:      General: She is not in acute distress.    Comments: Frail  HENT:     Head: Normocephalic.  Eyes:     General:        Right eye: No discharge.        Left eye: No discharge.  Cardiovascular:     Rate and Rhythm: Normal rate and regular rhythm.     Pulses: Normal pulses.     Heart sounds: Normal heart sounds.  Pulmonary:     Effort: Pulmonary effort is normal. No respiratory distress.     Breath sounds: Normal breath sounds. No wheezing.  Abdominal:     General: Bowel sounds are normal. There is no distension.     Palpations: Abdomen is soft.     Tenderness: There is no abdominal tenderness.  Musculoskeletal:     Cervical back: Neck supple.     Right lower leg: Edema present.     Left lower leg: Edema present.     Comments: Non pitting  Skin:    General: Skin is warm and dry.     Capillary Refill: Capillary refill takes less than 2 seconds.  Neurological:     General: No focal deficit present.     Mental Status: She is alert. Mental status is at baseline.     Motor: Weakness present.     Gait: Gait abnormal.     Comments: walker  Psychiatric:        Mood and Affect: Mood normal.        Behavior: Behavior normal.     Labs reviewed: Recent Labs    02/27/22 0430 03/13/22 1102 03/21/22 0000 03/28/22 0000 04/03/22 1027  NA 133*  134* 132* 136* 135  K 4.8 4.2 4.5 3.8 4.1  CL 108 103 103 103 104  CO2 20* 25 23* 25* 22  GLUCOSE 94 144*  --   --  116*  BUN 25* 16 20 15 22   CREATININE 0.81 0.90 0.7 0.8 0.93  CALCIUM  9.3 10.3 9.3 10.1 10.2  MG 2.0  --   --   --   --   PHOS 2.2*  --   --   --   --    Recent Labs    02/27/22 0430 03/13/22 1102 03/21/22 0000 03/28/22 0000 04/03/22 1027  AST 25 20 23 23 21   ALT 18 29 23 24 16   ALKPHOS 56 88 73 80 75  BILITOT 0.7 0.3  --   --  0.4  PROT 5.5* 6.3*  --   --  6.3*  ALBUMIN 3.0* 3.7 3.3* 3.7 3.4*   Recent Labs    02/27/22 0430 03/13/22 1102 03/21/22 0000 03/22/22 0000 03/28/22 0000 04/03/22 1027  WBC 6.5 7.2   < > 3.3 3.0 5.0  NEUTROABS 5.1 5.6  --  2,459.00 1,821.00 3.6  HGB 12.7 11.8*   < > 11.0* 11.0* 10.8*  HCT 38.1 33.9*   < > 32* 32* 30.9*  MCV 91.1 90.2  --   --   --  90.6  PLT 132* 450*   < > 120* 80* 237   < > = values in this interval not displayed.   Lab Results  Component Value Date   TSH 1.675 04/03/2022   Lab Results  Component Value Date   HGBA1C 5.7 (H) 02/26/2022   Lab Results  Component Value Date   CHOL 143 02/27/2022   HDL 59 02/27/2022   LDLCALC 67 02/27/2022   TRIG 83 02/27/2022   CHOLHDL 2.4 02/27/2022    Significant Diagnostic Results in last 30 days:  No results found.  Assessment/Plan 1. Nausea - receiving palliative systemic chemotherapy- suspect from this - exam unremarkable - does not like compazine due to drowsiness - start reglan 5 mg po BID  2. Hyponatremia - Na+ 132 (11/30)> was 135 (11/22) - encouraged to drink Gatorade daily for a few days - repeat labs scheduled with oncology 12/06  3. Malignant neoplasm of upper lobe of left lung ([place]) - followed by Dr. Earlie Server - remains on palliative systemic chemotherapy - 12/08 CT chest/abdomen scheduled to restage disease   Family/ staff Communication: plan discussed with patient and nurse  Labs/tests ordered:  none

## 2022-04-15 DIAGNOSIS — R2681 Unsteadiness on feet: Secondary | ICD-10-CM | POA: Diagnosis not present

## 2022-04-15 DIAGNOSIS — R278 Other lack of coordination: Secondary | ICD-10-CM | POA: Diagnosis not present

## 2022-04-15 DIAGNOSIS — I639 Cerebral infarction, unspecified: Secondary | ICD-10-CM | POA: Diagnosis not present

## 2022-04-15 DIAGNOSIS — R2689 Other abnormalities of gait and mobility: Secondary | ICD-10-CM | POA: Diagnosis not present

## 2022-04-15 DIAGNOSIS — R29898 Other symptoms and signs involving the musculoskeletal system: Secondary | ICD-10-CM | POA: Diagnosis not present

## 2022-04-15 DIAGNOSIS — M6281 Muscle weakness (generalized): Secondary | ICD-10-CM | POA: Diagnosis not present

## 2022-04-15 DIAGNOSIS — I63442 Cerebral infarction due to embolism of left cerebellar artery: Secondary | ICD-10-CM | POA: Diagnosis not present

## 2022-04-17 ENCOUNTER — Telehealth: Payer: Self-pay | Admitting: Medical Oncology

## 2022-04-17 ENCOUNTER — Inpatient Hospital Stay: Payer: Medicare PPO

## 2022-04-17 DIAGNOSIS — R278 Other lack of coordination: Secondary | ICD-10-CM | POA: Diagnosis not present

## 2022-04-17 DIAGNOSIS — R29898 Other symptoms and signs involving the musculoskeletal system: Secondary | ICD-10-CM | POA: Diagnosis not present

## 2022-04-17 DIAGNOSIS — M6281 Muscle weakness (generalized): Secondary | ICD-10-CM | POA: Diagnosis not present

## 2022-04-17 DIAGNOSIS — I63442 Cerebral infarction due to embolism of left cerebellar artery: Secondary | ICD-10-CM | POA: Diagnosis not present

## 2022-04-17 DIAGNOSIS — R2689 Other abnormalities of gait and mobility: Secondary | ICD-10-CM | POA: Diagnosis not present

## 2022-04-17 DIAGNOSIS — I639 Cerebral infarction, unspecified: Secondary | ICD-10-CM | POA: Diagnosis not present

## 2022-04-17 DIAGNOSIS — R2681 Unsteadiness on feet: Secondary | ICD-10-CM | POA: Diagnosis not present

## 2022-04-17 NOTE — Telephone Encounter (Signed)
LVM to fax lab results today -pt has CT scan 12/08 and results are needed before pt can have IV contrast.

## 2022-04-18 DIAGNOSIS — I63442 Cerebral infarction due to embolism of left cerebellar artery: Secondary | ICD-10-CM | POA: Diagnosis not present

## 2022-04-18 DIAGNOSIS — R2681 Unsteadiness on feet: Secondary | ICD-10-CM | POA: Diagnosis not present

## 2022-04-18 DIAGNOSIS — M6281 Muscle weakness (generalized): Secondary | ICD-10-CM | POA: Diagnosis not present

## 2022-04-18 DIAGNOSIS — I639 Cerebral infarction, unspecified: Secondary | ICD-10-CM | POA: Diagnosis not present

## 2022-04-18 DIAGNOSIS — R278 Other lack of coordination: Secondary | ICD-10-CM | POA: Diagnosis not present

## 2022-04-18 DIAGNOSIS — R2689 Other abnormalities of gait and mobility: Secondary | ICD-10-CM | POA: Diagnosis not present

## 2022-04-19 ENCOUNTER — Ambulatory Visit (HOSPITAL_COMMUNITY)
Admission: RE | Admit: 2022-04-19 | Discharge: 2022-04-19 | Disposition: A | Discharge status: Home / Self Care | Payer: Medicare PPO | Source: Ambulatory Visit | Attending: Internal Medicine | Admitting: Internal Medicine

## 2022-04-19 DIAGNOSIS — I63442 Cerebral infarction due to embolism of left cerebellar artery: Secondary | ICD-10-CM | POA: Diagnosis not present

## 2022-04-19 DIAGNOSIS — C7951 Secondary malignant neoplasm of bone: Secondary | ICD-10-CM | POA: Diagnosis not present

## 2022-04-19 DIAGNOSIS — I639 Cerebral infarction, unspecified: Secondary | ICD-10-CM | POA: Diagnosis not present

## 2022-04-19 DIAGNOSIS — K8689 Other specified diseases of pancreas: Secondary | ICD-10-CM | POA: Diagnosis not present

## 2022-04-19 DIAGNOSIS — M6281 Muscle weakness (generalized): Secondary | ICD-10-CM | POA: Diagnosis not present

## 2022-04-19 DIAGNOSIS — J90 Pleural effusion, not elsewhere classified: Secondary | ICD-10-CM | POA: Diagnosis not present

## 2022-04-19 DIAGNOSIS — R29898 Other symptoms and signs involving the musculoskeletal system: Secondary | ICD-10-CM | POA: Diagnosis not present

## 2022-04-19 DIAGNOSIS — N281 Cyst of kidney, acquired: Secondary | ICD-10-CM | POA: Diagnosis not present

## 2022-04-19 DIAGNOSIS — C3490 Malignant neoplasm of unspecified part of unspecified bronchus or lung: Secondary | ICD-10-CM

## 2022-04-19 MED ORDER — SODIUM CHLORIDE (PF) 0.9 % IJ SOLN
INTRAMUSCULAR | Status: AC
  Filled 2022-04-19: qty 50

## 2022-04-19 MED ORDER — IOHEXOL 300 MG/ML  SOLN
75.0000 mL | Freq: Once | INTRAMUSCULAR | Status: AC | PRN
  Administered 2022-04-19: 75 mL via INTRAVENOUS

## 2022-04-19 MED ORDER — IOHEXOL 9 MG/ML PO SOLN
ORAL | Status: AC
  Filled 2022-04-19: qty 1000

## 2022-04-22 DIAGNOSIS — R29898 Other symptoms and signs involving the musculoskeletal system: Secondary | ICD-10-CM | POA: Diagnosis not present

## 2022-04-22 DIAGNOSIS — M6281 Muscle weakness (generalized): Secondary | ICD-10-CM | POA: Diagnosis not present

## 2022-04-22 DIAGNOSIS — I639 Cerebral infarction, unspecified: Secondary | ICD-10-CM | POA: Diagnosis not present

## 2022-04-22 DIAGNOSIS — I63442 Cerebral infarction due to embolism of left cerebellar artery: Secondary | ICD-10-CM | POA: Diagnosis not present

## 2022-04-22 DIAGNOSIS — R2689 Other abnormalities of gait and mobility: Secondary | ICD-10-CM | POA: Diagnosis not present

## 2022-04-22 DIAGNOSIS — R2681 Unsteadiness on feet: Secondary | ICD-10-CM | POA: Diagnosis not present

## 2022-04-22 DIAGNOSIS — R278 Other lack of coordination: Secondary | ICD-10-CM | POA: Diagnosis not present

## 2022-04-23 ENCOUNTER — Other Ambulatory Visit (HOSPITAL_COMMUNITY): Payer: Self-pay

## 2022-04-23 DIAGNOSIS — R29898 Other symptoms and signs involving the musculoskeletal system: Secondary | ICD-10-CM | POA: Diagnosis not present

## 2022-04-23 DIAGNOSIS — R2681 Unsteadiness on feet: Secondary | ICD-10-CM | POA: Diagnosis not present

## 2022-04-23 DIAGNOSIS — I639 Cerebral infarction, unspecified: Secondary | ICD-10-CM | POA: Diagnosis not present

## 2022-04-23 DIAGNOSIS — I63442 Cerebral infarction due to embolism of left cerebellar artery: Secondary | ICD-10-CM | POA: Diagnosis not present

## 2022-04-23 DIAGNOSIS — M6281 Muscle weakness (generalized): Secondary | ICD-10-CM | POA: Diagnosis not present

## 2022-04-23 DIAGNOSIS — R278 Other lack of coordination: Secondary | ICD-10-CM | POA: Diagnosis not present

## 2022-04-23 DIAGNOSIS — R2689 Other abnormalities of gait and mobility: Secondary | ICD-10-CM | POA: Diagnosis not present

## 2022-04-23 MED FILL — Fosaprepitant Dimeglumine For IV Infusion 150 MG (Base Eq): INTRAVENOUS | Qty: 5 | Status: AC

## 2022-04-23 MED FILL — Dexamethasone Sodium Phosphate Inj 100 MG/10ML: INTRAMUSCULAR | Qty: 1 | Status: AC

## 2022-04-24 ENCOUNTER — Encounter: Payer: Self-pay | Admitting: Internal Medicine

## 2022-04-24 ENCOUNTER — Telehealth: Payer: Self-pay | Admitting: Medical Oncology

## 2022-04-24 ENCOUNTER — Other Ambulatory Visit: Payer: Self-pay | Admitting: Medical Oncology

## 2022-04-24 ENCOUNTER — Inpatient Hospital Stay: Payer: Medicare PPO | Attending: Physician Assistant | Admitting: Internal Medicine

## 2022-04-24 ENCOUNTER — Other Ambulatory Visit: Payer: Self-pay

## 2022-04-24 ENCOUNTER — Inpatient Hospital Stay: Payer: Medicare PPO

## 2022-04-24 VITALS — BP 162/74 | HR 89 | Temp 97.9°F | Resp 17 | Wt 100.2 lb

## 2022-04-24 DIAGNOSIS — C3490 Malignant neoplasm of unspecified part of unspecified bronchus or lung: Secondary | ICD-10-CM

## 2022-04-24 DIAGNOSIS — C3432 Malignant neoplasm of lower lobe, left bronchus or lung: Secondary | ICD-10-CM | POA: Insufficient documentation

## 2022-04-24 DIAGNOSIS — R5383 Other fatigue: Secondary | ICD-10-CM | POA: Diagnosis not present

## 2022-04-24 DIAGNOSIS — Z8673 Personal history of transient ischemic attack (TIA), and cerebral infarction without residual deficits: Secondary | ICD-10-CM | POA: Insufficient documentation

## 2022-04-24 DIAGNOSIS — C3412 Malignant neoplasm of upper lobe, left bronchus or lung: Secondary | ICD-10-CM

## 2022-04-24 DIAGNOSIS — Z79899 Other long term (current) drug therapy: Secondary | ICD-10-CM | POA: Diagnosis not present

## 2022-04-24 DIAGNOSIS — R590 Localized enlarged lymph nodes: Secondary | ICD-10-CM | POA: Diagnosis not present

## 2022-04-24 DIAGNOSIS — R0602 Shortness of breath: Secondary | ICD-10-CM | POA: Diagnosis not present

## 2022-04-24 DIAGNOSIS — C782 Secondary malignant neoplasm of pleura: Secondary | ICD-10-CM | POA: Insufficient documentation

## 2022-04-24 DIAGNOSIS — C778 Secondary and unspecified malignant neoplasm of lymph nodes of multiple regions: Secondary | ICD-10-CM | POA: Insufficient documentation

## 2022-04-24 DIAGNOSIS — C3492 Malignant neoplasm of unspecified part of left bronchus or lung: Secondary | ICD-10-CM

## 2022-04-24 LAB — CBC WITH DIFFERENTIAL (CANCER CENTER ONLY)
Abs Immature Granulocytes: 0.28 10*3/uL — ABNORMAL HIGH (ref 0.00–0.07)
Basophils Absolute: 0.1 10*3/uL (ref 0.0–0.1)
Basophils Relative: 1 %
Eosinophils Absolute: 0 10*3/uL (ref 0.0–0.5)
Eosinophils Relative: 0 %
HCT: 26.4 % — ABNORMAL LOW (ref 36.0–46.0)
Hemoglobin: 9.3 g/dL — ABNORMAL LOW (ref 12.0–15.0)
Immature Granulocytes: 4 %
Lymphocytes Relative: 6 %
Lymphs Abs: 0.4 10*3/uL — ABNORMAL LOW (ref 0.7–4.0)
MCH: 31.5 pg (ref 26.0–34.0)
MCHC: 35.2 g/dL (ref 30.0–36.0)
MCV: 89.5 fL (ref 80.0–100.0)
Monocytes Absolute: 1.2 10*3/uL — ABNORMAL HIGH (ref 0.1–1.0)
Monocytes Relative: 18 %
Neutro Abs: 5 10*3/uL (ref 1.7–7.7)
Neutrophils Relative %: 71 %
Platelet Count: 348 10*3/uL (ref 150–400)
RBC: 2.95 MIL/uL — ABNORMAL LOW (ref 3.87–5.11)
RDW: 17.9 % — ABNORMAL HIGH (ref 11.5–15.5)
WBC Count: 7 10*3/uL (ref 4.0–10.5)
nRBC: 0 % (ref 0.0–0.2)

## 2022-04-24 LAB — CMP (CANCER CENTER ONLY)
ALT: 9 U/L (ref 0–44)
AST: 16 U/L (ref 15–41)
Albumin: 3.1 g/dL — ABNORMAL LOW (ref 3.5–5.0)
Alkaline Phosphatase: 67 U/L (ref 38–126)
Anion gap: 7 (ref 5–15)
BUN: 22 mg/dL (ref 8–23)
CO2: 26 mmol/L (ref 22–32)
Calcium: 10.3 mg/dL (ref 8.9–10.3)
Chloride: 101 mmol/L (ref 98–111)
Creatinine: 0.81 mg/dL (ref 0.44–1.00)
GFR, Estimated: >60 mL/min (ref >60)
Glucose, Bld: 145 mg/dL — ABNORMAL HIGH (ref 70–99)
Potassium: 4 mmol/L (ref 3.5–5.1)
Sodium: 134 mmol/L — ABNORMAL LOW (ref 135–145)
Total Bilirubin: 0.4 mg/dL (ref 0.3–1.2)
Total Protein: 5.6 g/dL — ABNORMAL LOW (ref 6.5–8.1)

## 2022-04-24 LAB — TSH: TSH: 2.797 u[IU]/mL (ref 0.350–4.500)

## 2022-04-24 LAB — TOTAL PROTEIN, URINE DIPSTICK: Protein, ur: 30 mg/dL — AB

## 2022-04-24 MED ORDER — PREDNISONE 10 MG PO TABS: ORAL_TABLET | ORAL | 0 refills | Status: DC

## 2022-04-24 MED ORDER — OMEPRAZOLE 20 MG PO CPDR: 20.0000 mg | DELAYED_RELEASE_CAPSULE | Freq: Every day | ORAL | 1 refills | Status: DC

## 2022-04-24 NOTE — Patient Instructions (Signed)
1) hold treatment with chemotherapy and Tagrisso for now. 2) prednisone taper as follows: 5 tablet p.o. daily for 1 week followed by 4 tablets p.o. daily for 1 week followed by 3 tablets p.o. daily for 1 week followed by 2 tablets p.o. daily for 1 week followed by 1 tablet p.o. daily for 1 week.  3) Prilosec 20 mg p.o. daily. 4) lab and scan before next MD visit with Dr. Julien Nordmann on May 30, 2022.

## 2022-04-24 NOTE — Telephone Encounter (Signed)
LVM to return my call and give me the names of her antiemetics on the [facility].

## 2022-04-24 NOTE — Progress Notes (Signed)
[place] [phone]   [phone]  OFFICE PROGRESS NOTE  [name], MD [place] [address]  DIAGNOSIS: Stage IV (T2b, N2, M1c), non-small cell lung cancer, adenocarcinoma.  The patient presented with a left upper lobe mass, right supra clavicular, left hilar and subcarinal lymphadenopathy as well as diffuse left-sided pleural disease with associated malignant left pleural effusion and 2 small lymph nodes near the SMA in addition to multiple brain metastasis diagnosed in December 2021.   Molecular studies by [facility] showed positive EGFR mutation with deletion in exon 19  PRIOR THERAPY: None   CURRENT THERAPY: 1) palliative systemic chemotherapy with carboplatin for an AUC of 5 Alimta 500 mg/m2 and Avastin IV every 3 weeks.  First dose on 02/20/22. Avastin discontinued starting from cycle #2 due to recent small CVA.  Status post 3 cycles. 2) Tagrisso 80 mg p.o. daily. First dose June 10, 2020.  Status post 23 months of treatment.  Starting October 2022 the patient is treated with Tagrisso 40 mg p.o. daily because of the intolerance. The patient had progression in September 2023 but continues to take this due to hx of metastatic disease to the brain.  Her treatment will be on hold because of concern about drug-induced pneumonitis.  INTERVAL HISTORY: Shelley Flores 88 y.o. female returns to the clinic today for follow-up visit.  The patient is feeling fine except for the generalized fatigue and weakness.  She has mild cough but no significant shortness of breath, chest pain or hemoptysis.  She has no nausea, vomiting, diarrhea or constipation.  She feels some fullness in her stomach.  She denied having any recent weight loss or night sweats.  She has been tolerating her systemic chemotherapy with carboplatin and Alimta fairly well.  She was also on treatment with Tagrisso 40 mg p.o. daily. She had repeat CT scan of the  chest, abdomen and pelvis performed recently and she is here for evaluation and discussion of her scan results and treatment options.    MEDICAL HISTORY: Past Medical History:  Diagnosis Date   Bruit    Abdominal bruit - Abdominal aorta/renal duplex Doppler evaluation 12/07/03 -Mildly abnormal evaluation. 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 This is consistant w/median arcuate ligament compression syndrome. *Right & Left Kidney: Essentially equal in size, symmetrical in shape w/no significant abnormalities visualized. *Right & Left Renal Arteries: No significant abnormalities.   Cancer ([place])    Edema extremities    LE edema    H/O myocardial perfusion scan 02/20/00   To rule out ischemia - Negative adequate Bruce protocol exercise stress test with a deconditioned exercise response and normal static myocardial perfusion images with EF calculated by QGS of 77%. Represents a low risk study.   Hyperlipidemia    Hypertension    Osteoarthritis    Osteoporosis    Pleural effusion 04/2020   Valvular heart disease    Mild valvular heart disease by Echo in 2010, all mild and symptomatic, including concentric LVH, MR, TR, and AI with pulmonary artery pressure of 32. EF was normal.    ALLERGIES:  is allergic to irbesartan.  MEDICATIONS:  Current Outpatient Medications  Medication Sig Dispense Refill   acetaminophen (TYLENOL) 325 MG tablet Take 2 tablets (650 mg total) by mouth every 6 (six) hours as needed for mild pain (or Fever >/= 101). 20 tablet 0   aspirin 81 MG tablet Take 81 mg by mouth  daily.     atorvastatin (LIPITOR) 20 MG tablet Take 20 mg by mouth at bedtime.     folic acid (FOLVITE) 1 MG tablet Take 1 tablet (1 mg total) by mouth daily. 30 tablet 2   magnesium hydroxide (MILK OF MAGNESIA) 400 MG/5ML suspension Take 15 mLs by mouth daily as needed for mild constipation.     metoCLOPramide (REGLAN) 5 MG tablet Take 5 mg by mouth 2 (two) times daily. Take it Before  meals     metoprolol tartrate (LOPRESSOR) 25 MG tablet Take 12.5 mg by mouth 2 (two) times daily.     midodrine (PROAMATINE) 2.5 MG tablet Take 1 tablet (2.5 mg total) by mouth 2 (two) times daily as needed (if sbp <100 on standing.). 4 tablet 0   osimertinib mesylate (TAGRISSO) 40 MG tablet Take 1 tablet (40 mg total) by mouth daily. 30 tablet 2   Povidone, PF, (IVIZIA DRY EYES) 0.5 % SOLN Place 1 drop into both eyes daily.     prochlorperazine (COMPAZINE) 10 MG tablet Take 1 tablet (10 mg total) by mouth every 6 (six) hours as needed. 30 tablet 2   No current facility-administered medications for this visit.    SURGICAL HISTORY:  Past Surgical History:  Procedure Laterality Date   Abdominal aorta/Renal duplex Doppler Evaluation  12/07/03   For abdominal bruit. Mildly abnormal evaluation. (See bruit in medical history)   BREAST EXCISIONAL BIOPSY Left 1966   BREAST EXCISIONAL BIOPSY Left 1999   CATARACT EXTRACTION  2003   CHEST TUBE INSERTION N/A 07/04/2020   Procedure: REMOVAL PLEURAL DRAINAGE CATHETER;  Surgeon: Garner Nash, DO;  Location: [facility];  Service: Pulmonary;  Laterality: N/A;   COLONOSCOPY  10/22/2004   DEXA Bone Scan  06/23/2013   IR PERC PLEURAL DRAIN W/INDWELL CATH W/IMG GUIDE  05/10/2020   IR THORACENTESIS ASP PLEURAL SPACE W/IMG GUIDE  05/04/2020    REVIEW OF SYSTEMS:  Constitutional: positive for fatigue Eyes: negative Ears, nose, mouth, throat, and face: negative Respiratory: positive for dyspnea on exertion Cardiovascular: negative Gastrointestinal: negative Genitourinary:negative Integument/breast: negative Hematologic/lymphatic: negative Musculoskeletal:positive for muscle weakness Neurological: negative Behavioral/Psych: negative Endocrine: negative Allergic/Immunologic: negative   PHYSICAL EXAMINATION: General appearance: alert, cooperative, fatigued, and no distress Head: Normocephalic, without obvious abnormality, atraumatic Neck: no  adenopathy, no JVD, supple, symmetrical, trachea midline, and thyroid not enlarged, symmetric, no tenderness/mass/nodules Lymph nodes: Cervical, supraclavicular, and axillary nodes normal. Resp: clear to auscultation bilaterally Back: symmetric, no curvature. ROM normal. No CVA tenderness. Cardio: regular rate and rhythm, S1, S2 normal, no murmur, click, rub or gallop GI: soft, non-tender; bowel sounds normal; no masses,  no organomegaly Extremities: extremities normal, atraumatic, no cyanosis or edema Neurologic: Alert and oriented X 3, normal strength and tone. Normal symmetric reflexes. Normal coordination and gait  ECOG PERFORMANCE STATUS: 1 - Symptomatic but completely ambulatory  Blood pressure (!) 162/74, pulse 89, temperature 97.9 F (36.6 C), temperature source Oral, resp. rate 17, weight 100 lb 4 oz (45.5 kg), SpO2 98 %.  LABORATORY DATA: Lab Results  Component Value Date   WBC 7.0 04/24/2022   HGB 9.3 (L) 04/24/2022   HCT 26.4 (L) 04/24/2022   MCV 89.5 04/24/2022   PLT 348 04/24/2022      Chemistry      Component Value Date/Time   NA 134 (L) 04/24/2022 1018   NA 136 (A) 03/28/2022 0000   K 4.0 04/24/2022 1018   CL 101 04/24/2022 1018   CO2 26 04/24/2022 1018  BUN 22 04/24/2022 1018   BUN 15 03/28/2022 0000   CREATININE 0.81 04/24/2022 1018   CREATININE 1.22 (H) 05/21/2021 0800   GLU 85 03/28/2022 0000      Component Value Date/Time   CALCIUM 10.3 04/24/2022 1018   ALKPHOS 67 04/24/2022 1018   AST 16 04/24/2022 1018   ALT 9 04/24/2022 1018   BILITOT 0.4 04/24/2022 1018       RADIOGRAPHIC STUDIES: CT Chest W Contrast  Result Date: 04/22/2022 CLINICAL DATA:  Non-small cell lung cancer restaging, ongoing chemotherapy and oral chemotherapy, former smoker, weight loss * Tracking Code: BO * EXAM: CT CHEST, ABDOMEN, AND PELVIS WITH CONTRAST TECHNIQUE: Multidetector CT imaging of the chest, abdomen and pelvis was performed following the standard protocol during  bolus administration of intravenous contrast. RADIATION DOSE REDUCTION: This exam was performed according to the departmental dose-optimization program which includes automated exposure control, adjustment of the mA and/or kV according to patient size and/or use of iterative reconstruction technique. CONTRAST:  76m OMNIPAQUE IOHEXOL 300 MG/ML SOLN additional oral enteric contrast COMPARISON:  01/28/2022 FINDINGS: CT CHEST FINDINGS Cardiovascular: Scattered aortic atherosclerosis. Normal heart size. Scattered left and right coronary artery calcifications. No pericardial effusion. Mediastinum/Nodes: Slightly diminished, although prominent left axillary and subpectoral lymph nodes, index node measuring 0.8 x 0.4 cm, previously 1.2 x 0.7 cm (series 2, image 16). Thyroid gland, trachea, and esophagus demonstrate no significant findings. Lungs/Pleura: No significant change in a spiculated nodule of the medial lingula measuring 2.6 x 1.8 cm (series 4, image 69). Significant interval worsening in the very extensive heterogeneous and ground-glass opacity, confluent nodularity and pleural thickening, and interlobular septal thickening throughout the lower left lung. Slight interval worsening of interlobular septal thickening throughout the right lung base (series 4, image 95). Unchanged small nodule of the peripheral posterior right upper lobe measuring 0.4 cm (series 4, image 46). Small right pleural effusion and associated atelectasis or consolidation, increased compared to prior. Similar appearance of a loculated small left pleural effusion extensive pleural thickening. Musculoskeletal: No chest wall abnormality. No acute osseous findings. Unchanged sclerotic lesion of the left aspect of T5 (series 2, image 15). CT ABDOMEN PELVIS FINDINGS Hepatobiliary: No solid liver abnormality is seen. No gallstones, gallbladder wall thickening, or biliary dilatation. Pancreas: Unchanged subcentimeter cystic lesion of the pancreatic  body (series 2, image 61). No pancreatic ductal dilatation or surrounding inflammatory changes. Spleen: Normal in size without significant abnormality. Adrenals/Urinary Tract: Adrenal glands are unremarkable. Multiple bilateral renal cortical and parapelvic cysts, including a septated cyst of the superior pole of the left kidney. Kidneys are otherwise normal, without renal calculi, solid lesion, or hydronephrosis. Bladder is unremarkable. Stomach/Bowel: Stomach is within normal limits. Appendix is not clearly visualized and may be surgically absent. No evidence of bowel wall thickening, distention, or inflammatory changes. Vascular/Lymphatic: No significant vascular findings are present. Interval decrease in size of enlarged gastrohepatic ligament, celiac axis, and retroperitoneal lymph nodes, index left retroperitoneal node measuring 1.1 x 0.4 cm, previously 1.8 x 0.9 cm (series 2, image 66). Reproductive: Status post hysterectomy. Other: No abdominal wall hernia or abnormality. New, trace ascites in the low pelvis (series 2, image 101). Musculoskeletal: No acute osseous findings. Unchanged small sclerotic metastasis of L3 (series 5, image 88) and of the right femoral neck (series 5, image 68). IMPRESSION: 1. No significant change in a treated, spiculated nodule of the medial lingula. 2. Significant interval worsening of very extensive heterogeneous and ground-glass opacity, confluent nodularity and pleural thickening throughout the lower  left lung. Slight interval worsening of interlobular septal thickening throughout the right lung base as well as an increased right pleural effusion and a small left pleural effusion with associated pleural thickening. Findings are consistent with worsened lymphangitic metastatic disease. 3. Diminished size of prominent left axillary and abdominal lymph nodes, consistent with treatment response of nodal metastatic disease. 4. Unchanged sclerotic osseous metastases. 5. New, trace  ascites in the low pelvis, nonspecific although modestly suspicious for malignant ascites. No directly visualized peritoneal or omental metastatic disease. 6. Coronary artery disease. Aortic Atherosclerosis (ICD10-I70.0). Electronically Signed   By: Delanna Ahmadi M.D.   On: 04/22/2022 06:31   CT Abdomen Pelvis W Contrast  Result Date: 04/22/2022 CLINICAL DATA:  Non-small cell lung cancer restaging, ongoing chemotherapy and oral chemotherapy, former smoker, weight loss * Tracking Code: BO * EXAM: CT CHEST, ABDOMEN, AND PELVIS WITH CONTRAST TECHNIQUE: Multidetector CT imaging of the chest, abdomen and pelvis was performed following the standard protocol during bolus administration of intravenous contrast. RADIATION DOSE REDUCTION: This exam was performed according to the departmental dose-optimization program which includes automated exposure control, adjustment of the mA and/or kV according to patient size and/or use of iterative reconstruction technique. CONTRAST:  62m OMNIPAQUE IOHEXOL 300 MG/ML SOLN additional oral enteric contrast COMPARISON:  01/28/2022 FINDINGS: CT CHEST FINDINGS Cardiovascular: Scattered aortic atherosclerosis. Normal heart size. Scattered left and right coronary artery calcifications. No pericardial effusion. Mediastinum/Nodes: Slightly diminished, although prominent left axillary and subpectoral lymph nodes, index node measuring 0.8 x 0.4 cm, previously 1.2 x 0.7 cm (series 2, image 16). Thyroid gland, trachea, and esophagus demonstrate no significant findings. Lungs/Pleura: No significant change in a spiculated nodule of the medial lingula measuring 2.6 x 1.8 cm (series 4, image 69). Significant interval worsening in the very extensive heterogeneous and ground-glass opacity, confluent nodularity and pleural thickening, and interlobular septal thickening throughout the lower left lung. Slight interval worsening of interlobular septal thickening throughout the right lung base (series 4,  image 95). Unchanged small nodule of the peripheral posterior right upper lobe measuring 0.4 cm (series 4, image 46). Small right pleural effusion and associated atelectasis or consolidation, increased compared to prior. Similar appearance of a loculated small left pleural effusion extensive pleural thickening. Musculoskeletal: No chest wall abnormality. No acute osseous findings. Unchanged sclerotic lesion of the left aspect of T5 (series 2, image 15). CT ABDOMEN PELVIS FINDINGS Hepatobiliary: No solid liver abnormality is seen. No gallstones, gallbladder wall thickening, or biliary dilatation. Pancreas: Unchanged subcentimeter cystic lesion of the pancreatic body (series 2, image 61). No pancreatic ductal dilatation or surrounding inflammatory changes. Spleen: Normal in size without significant abnormality. Adrenals/Urinary Tract: Adrenal glands are unremarkable. Multiple bilateral renal cortical and parapelvic cysts, including a septated cyst of the superior pole of the left kidney. Kidneys are otherwise normal, without renal calculi, solid lesion, or hydronephrosis. Bladder is unremarkable. Stomach/Bowel: Stomach is within normal limits. Appendix is not clearly visualized and may be surgically absent. No evidence of bowel wall thickening, distention, or inflammatory changes. Vascular/Lymphatic: No significant vascular findings are present. Interval decrease in size of enlarged gastrohepatic ligament, celiac axis, and retroperitoneal lymph nodes, index left retroperitoneal node measuring 1.1 x 0.4 cm, previously 1.8 x 0.9 cm (series 2, image 66). Reproductive: Status post hysterectomy. Other: No abdominal wall hernia or abnormality. New, trace ascites in the low pelvis (series 2, image 101). Musculoskeletal: No acute osseous findings. Unchanged small sclerotic metastasis of L3 (series 5, image 88) and of the right femoral neck (series  5, image 68). IMPRESSION: 1. No significant change in a treated, spiculated  nodule of the medial lingula. 2. Significant interval worsening of very extensive heterogeneous and ground-glass opacity, confluent nodularity and pleural thickening throughout the lower left lung. Slight interval worsening of interlobular septal thickening throughout the right lung base as well as an increased right pleural effusion and a small left pleural effusion with associated pleural thickening. Findings are consistent with worsened lymphangitic metastatic disease. 3. Diminished size of prominent left axillary and abdominal lymph nodes, consistent with treatment response of nodal metastatic disease. 4. Unchanged sclerotic osseous metastases. 5. New, trace ascites in the low pelvis, nonspecific although modestly suspicious for malignant ascites. No directly visualized peritoneal or omental metastatic disease. 6. Coronary artery disease. Aortic Atherosclerosis (ICD10-I70.0). Electronically Signed   By: Delanna Ahmadi M.D.   On: 04/22/2022 06:31     ASSESSMENT AND PLAN: This is a very pleasant 88 years old white female recently diagnosed with a stage IV (T3, N3, M1 C) non-small cell lung cancer, adenocarcinoma with positive EGFR mutation with deletion in exon 19 diagnosed in December 2021 and presented with left lower lobe lung mass in addition to right supraclavicular, left hilar and subcarinal lymphadenopathy as well as metastatic lymphadenopathy in the abdomen and left pleural-based metastasis as well as malignant left pleural effusion and multiple brain metastasis. The molecular study showed positive EGFR mutation with deletion in exon 19. The patient started treatment with Tagrisso 80 mg p.o. daily on June 10, 2020.  Starting from October 2022 her dose has changed to 40 mg p.o. daily.  She is status post a total of 22 months of treatment. She had evidence for disease progression and the patient started systemic chemotherapy initially with carboplatin for AUC of 5, Alimta 500 Mg/M2 and Avastin 15  Mg/KG every 3 weeks status post 3 cycles.  Avastin was discontinued after cycle #1 secondary to suspicious stroke.   The patient has been tolerating this treatment well except for the fatigue and mild shortness of breath.  She also has been on treatment with Tagrisso during the course of the chemotherapy. She had repeat CT scan of the chest, abdomen and pelvis performed recently.  I personally and independently reviewed the scan images and discussed the result and showed the images to the patient today. Her scan showed no significant change in the treated spiculated nodule of the medial lingula but there was significant interval worsening of the very extensive heterogeneous and groundglass opacity, confluent nodularity and pleural thickening throughout the lower left lung with interval worsening of interlobular septal thickening throughout the right lung base with increased right pleural effusion and small left pleural effusion.  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  There was diminished size of the prominent left axillary and abdominal lymph nodes consistent with treatment response. I recommended for the patient to hold her current systemic chemotherapy as well as Tagrisso for the next few weeks until improvement of the drug-induced pneumonitis. For the pneumonitis, I will start her on a tapered dose of prednisone over the next 4-5 weeks. I will arrange for the patient to have repeat CT scan of the chest in 5 weeks for close monitoring of these opacities. If she has improvement in her disease, she will resume her treatment. For the GI prophylaxis, I will start the patient on Prilosec 20 mg p.o. daily. She was advised to call immediately if she has any other concerning symptoms in the interval. The patient voices  understanding of current disease status and treatment options and is in agreement with the current care plan.  All questions were  answered. The patient knows to call the clinic with any problems, questions or concerns. We can certainly see the patient much sooner if necessary.  The total time spent in the appointment was 40 minutes.  Disclaimer: This note was dictated with voice recognition software. Similar sounding words can inadvertently be transcribed and may not be corrected upon review.

## 2022-04-25 ENCOUNTER — Other Ambulatory Visit: Payer: Self-pay

## 2022-04-25 ENCOUNTER — Encounter: Payer: Self-pay | Admitting: Neurology

## 2022-04-25 ENCOUNTER — Other Ambulatory Visit (HOSPITAL_COMMUNITY): Payer: Self-pay

## 2022-04-25 ENCOUNTER — Ambulatory Visit: Payer: Medicare PPO | Admitting: Neurology

## 2022-04-25 VITALS — BP 161/75 | HR 89

## 2022-04-25 DIAGNOSIS — R2681 Unsteadiness on feet: Secondary | ICD-10-CM | POA: Diagnosis not present

## 2022-04-25 DIAGNOSIS — I639 Cerebral infarction, unspecified: Secondary | ICD-10-CM

## 2022-04-25 NOTE — Telephone Encounter (Signed)
Med tech stated pt has zofran and compazine on her MAR at [facility] and both are prn.

## 2022-04-25 NOTE — Patient Instructions (Signed)
I had a long d/w patient about his recent silent cerebellar stroke, risk for recurrent stroke/TIAs, personally independently reviewed imaging studies and stroke evaluation results and answered questions.follow results ongoing 30-day heart monitoring.  Continue aspirin 81 mg daily  for secondary stroke prevention and maintain strict control of hypertension with blood pressure goal below 130/90, diabetes with hemoglobin A1c goal below 6.5% and lipids with LDL cholesterol goal below 70 mg/dL. I also advised the patient to eat a healthy diet with plenty of whole grains, cereals, fruits and vegetables, exercise regularly and maintain ideal body weight.  Discontinue midodrine patient blood pressure has been quite high.  Continue ongoing physical occupational therapy.  I feel patient balance issues are longstanding and likely recently aggravated by carboplatin chemotherapy.  Followup in the future with me in 6 months or call earlier if necessary . Stroke Prevention Some medical conditions and behaviors can lead to a higher chance of having a stroke. You can help prevent a stroke by eating healthy, exercising, not smoking, and managing any medical conditions you have. Stroke is a leading cause of functional impairment. Primary prevention is particularly important because a majority of strokes are first-time events. Stroke changes the lives of not only those who experience a stroke but also their family and other caregivers. How can this condition affect me? A stroke is a medical emergency and should be treated right away. A stroke can lead to brain damage and can sometimes be life-threatening. If a person gets medical treatment right away, there is a better chance of surviving and recovering from a stroke. What can increase my risk? The following medical conditions may increase your risk of a stroke: Cardiovascular disease. High blood pressure (hypertension). Diabetes. High cholesterol. Sickle cell  disease. Blood clotting disorders (hypercoagulable state). Obesity. Sleep disorders (obstructive sleep apnea). Other risk factors include: Being older than age 56. Having a history of blood clots, stroke, or mini-stroke (transient ischemic attack, TIA). Genetic factors, such as race, ethnicity, or a family history of stroke. Smoking cigarettes or using other tobacco products. Taking birth control pills, especially if you also use tobacco. Heavy use of alcohol or drugs, especially cocaine and methamphetamine. Physical inactivity. What actions can I take to prevent this? Manage your health conditions High cholesterol levels. Eating a healthy diet is important for preventing high cholesterol. If cholesterol cannot be managed through diet alone, you may need to take medicines. Take any prescribed medicines to control your cholesterol as told by your health care provider. Hypertension. To reduce your risk of stroke, try to keep your blood pressure below 130/80. Eating a healthy diet and exercising regularly are important for controlling blood pressure. If these steps are not enough to manage your blood pressure, you may need to take medicines. Take any prescribed medicines to control hypertension as told by your health care provider. Ask your health care provider if you should monitor your blood pressure at home. Have your blood pressure checked every year, even if your blood pressure is normal. Blood pressure increases with age and some medical conditions. Diabetes. Eating a healthy diet and exercising regularly are important parts of managing your blood sugar (glucose). If your blood sugar cannot be managed through diet and exercise, you may need to take medicines. Take any prescribed medicines to control your diabetes as told by your health care provider. Get evaluated for obstructive sleep apnea. Talk to your health care provider about getting a sleep evaluation if you snore a lot or have  excessive sleepiness.  Make sure that any other medical conditions you have, such as atrial fibrillation or atherosclerosis, are managed. Nutrition Follow instructions from your health care provider about what to eat or drink to help manage your health condition. These instructions may include: Reducing your daily calorie intake. Limiting how much salt (sodium) you use to 1,500 milligrams (mg) each day. Using only healthy fats for cooking, such as olive oil, canola oil, or sunflower oil. Eating healthy foods. You can do this by: Choosing foods that are high in fiber, such as whole grains, and fresh fruits and vegetables. Eating at least 5 servings of fruits and vegetables a day. Try to fill one-half of your plate with fruits and vegetables at each meal. Choosing lean protein foods, such as lean cuts of meat, poultry without skin, fish, tofu, beans, and nuts. Eating low-fat dairy products. Avoiding foods that are high in sodium. This can help lower blood pressure. Avoiding foods that have saturated fat, trans fat, and cholesterol. This can help prevent high cholesterol. Avoiding processed and prepared foods. Counting your daily carbohydrate intake.  Lifestyle If you drink alcohol: Limit how much you have to: 0-1 drink a day for women who are not pregnant. 0-2 drinks a day for men. Know how much alcohol is in your drink. In the [place] one drink equals one 12 oz bottle of beer (398mL), one 5 oz glass of wine (172mL), or one 1 oz glass of hard liquor (7mL). Do not use any products that contain nicotine or tobacco. These products include cigarettes, chewing tobacco, and vaping devices, such as e-cigarettes. If you need help quitting, ask your health care provider. Avoid secondhand smoke. Do not use drugs. Activity  Try to stay at a healthy weight. Get at least 30 minutes of exercise on most days, such as: Fast walking. Biking. Swimming. Medicines Take over-the-counter and prescription  medicines only as told by your health care provider. Aspirin or blood thinners (antiplatelets or anticoagulants) may be recommended to reduce your risk of forming blood clots that can lead to stroke. Avoid taking birth control pills. Talk to your health care provider about the risks of taking birth control pills if: You are over 16 years old. You smoke. You get very bad headaches. You have had a blood clot. Where to find more information American Stroke Association: www.strokeassociation.org Get help right away if: You or a loved one has any symptoms of a stroke. "BE FAST" is an easy way to remember the main warning signs of a stroke: B - Balance. Signs are dizziness, sudden trouble walking, or loss of balance. E - Eyes. Signs are trouble seeing or a sudden change in vision. F - Face. Signs are sudden weakness or numbness of the face, or the face or eyelid drooping on one side. A - Arms. Signs are weakness or numbness in an arm. This happens suddenly and usually on one side of the body. S - Speech. Signs are sudden trouble speaking, slurred speech, or trouble understanding what people say. T - Time. Time to call emergency services. Write down what time symptoms started. You or a loved one has other signs of a stroke, such as: A sudden, severe headache with no known cause. Nausea or vomiting. Seizure. These symptoms may represent a serious problem that is an emergency. Do not wait to see if the symptoms will go away. Get medical help right away. Call your local emergency services (911 in the [place] Do not drive yourself to the hospital. Summary You can  help to prevent a stroke by eating healthy, exercising, not smoking, limiting alcohol intake, and managing any medical conditions you may have. Do not use any products that contain nicotine or tobacco. These include cigarettes, chewing tobacco, and vaping devices, such as e-cigarettes. If you need help quitting, ask your health care  provider. Remember "BE FAST" for warning signs of a stroke. Get help right away if you or a loved one has any of these signs. This information is not intended to replace advice given to you by your health care provider. Make sure you discuss any questions you have with your health care provider. Document Revised: 11/29/2019 Document Reviewed: 11/29/2019 Elsevier Patient Education  [place].

## 2022-04-25 NOTE — Progress Notes (Signed)
[facility] [address] [place]. [place] [address] [phone]       OFFICE CONSULT NOTE  [name] Date of Birth:  [birth year] Medical Record Number:  [id number]   Referring MD:   Antonieta Pert  Reason for Referral: Stroke  HPI: Ms. [name] is a 88 year old pleasant Caucasian lady seen today for initial office consultation visit for stroke.  History is obtained from the patient and review of electronic medical records and I personally reviewed pertinent available imaging in PACS.Shelley Flores is a 88 y.o. female with past medical history of stage IV lung cancer, recent chemotherapy of carboplatin and Alimta 7 days prior to admission previously was on Tagrisso per patient, hypertension, hyperlipidemia presenting to the ED on 02/26/2022 with a 6-day history of generalized weakness, gait abnormalities with increased wobbliness having to hold onto things as she moved around and felt like she was going to lose her balance. Symptoms started about a week ago. Denies facial droop, slurred speech, weakness, numbness or tingling.  Patient states that she had just finished her chemotherapy regimen containing carboplatin and she has started feeling weak all over with poor balance following this treatment.  She denied any slurred speech or focal weakness or incoordination on the left side.  She has no prior history of strokes or TIAs.  MRI scan of the brain shows tiny left cerebellar infarct.  There is also weak diffusion positive right frontal cortical and left subcortical lesions possibly related to subacute infarcts which were not reported.  Echocardiogram showed ejection fraction of 65 to 70% without cardiac source of embolism.  CT angiogram of the brain and neck showed no large stenosis.  LDL cholesterol was 67 mg percent and hemoglobin A1c was 5.7.  Patient was started on aspirin for stroke prevention which she is tolerating well without bruising or bleeding.  He is also  on Lipitor 20 mg denies any significant pain.  Blood pressure usually controlled today it is 165/75.  She denies any recurrent stroke or TIA symptoms.  Currently wearing a heart monitor initially.  She denies any history of atrial fibrillation, palpitation or syncopal event.  She does follow-up with cardiology Dr. Debara Pickett and has appointment to see him next month.  ROS:   14 system review of systems is positive for dizziness, imbalance, walking difficulty and all other systems negative  PMH:  Past Medical History:  Diagnosis Date   Bruit    Abdominal bruit - Abdominal aorta/renal duplex Doppler evaluation 12/07/03 -Mildly abnormal evaluation. 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 This is consistant w/median arcuate ligament compression syndrome. *Right & Left Kidney: Essentially equal in size, symmetrical in shape w/no significant abnormalities visualized. *Right & Left Renal Arteries: No significant abnormalities.   Cancer ([place])    Edema extremities    LE edema    H/O myocardial perfusion scan 02/20/00   To rule out ischemia - Negative adequate Bruce protocol exercise stress test with a deconditioned exercise response and normal static myocardial perfusion images with EF calculated by QGS of 77%. Represents a low risk study.   Hyperlipidemia    Hypertension    Osteoarthritis    Osteoporosis    Pleural effusion 04/2020   Valvular heart disease    Mild valvular heart disease by Echo in 2010, all mild and symptomatic, including concentric LVH, MR, TR, and AI with pulmonary artery pressure of 32. EF was normal.    Social History:  Social History   Socioeconomic History  Marital status: Single    Spouse name: Not on file   Number of children: Not on file   Years of education: Not on file   Highest education level: Not on file  Occupational History   Occupation: Retired    Fish farm manager: [employer]    Comment: Chemistry professor  Tobacco Use   Smoking status: Former     Packs/day: 0.25    Years: 6.00    Total pack years: 1.50    Types: Cigarettes    Quit date: 05/13/1962    Years since quitting: 59.9   Smokeless tobacco: Never  Vaping Use   Vaping Use: Never used  Substance and Sexual Activity   Alcohol use: Yes    Alcohol/week: 2.0 standard drinks of alcohol    Types: 2 Standard drinks or equivalent per week    Comment: eine   Drug use: No   Sexual activity: Not on file  Other Topics Concern   Not on file  Social History Narrative   Not on file   Social Determinants of Health   Financial Resource Strain: Low Risk  (09/10/2021)   Overall Financial Resource Strain (CARDIA)    Difficulty of Paying Living Expenses: Not hard at all  Food Insecurity: No Food Insecurity (02/26/2022)   Hunger Vital Sign    Worried About Running Out of Food in the Last Year: Never true    Ran Out of Food in the Last Year: Never true  Transportation Needs: No Transportation Needs (02/26/2022)   PRAPARE - Hydrologist (Medical): No    Lack of Transportation (Non-Medical): No  Physical Activity: Sufficiently Active (09/10/2021)   Exercise Vital Sign    Days of Exercise per Week: 7 days    Minutes of Exercise per Session: 30 min  Stress: No Stress Concern Present (09/10/2021)   [place]    Feeling of Stress : Not at all  Social Connections: Socially Isolated (09/10/2021)   Social Connection and Isolation Panel [NHANES]    Frequency of Communication with Friends and Family: Twice a week    Frequency of Social Gatherings with Friends and Family: Three times a week    Attends Religious Services: Never    Active Member of Clubs or Organizations: No    Attends Archivist Meetings: Never    Marital Status: Never married  Intimate Partner Violence: Not At Risk (02/26/2022)   Humiliation, Afraid, Rape, and Kick questionnaire    Fear of Current or Ex-Partner: No     Emotionally Abused: No    Physically Abused: No    Sexually Abused: No    Medications:   Current Outpatient Medications on File Prior to Visit  Medication Sig Dispense Refill   acetaminophen (TYLENOL) 325 MG tablet Take 2 tablets (650 mg total) by mouth every 6 (six) hours as needed for mild pain (or Fever >/= 101). 20 tablet 0   aspirin 81 MG tablet Take 81 mg by mouth daily.     atorvastatin (LIPITOR) 20 MG tablet Take 20 mg by mouth at bedtime.     folic acid (FOLVITE) 1 MG tablet Take 1 tablet (1 mg total) by mouth daily. 30 tablet 2   magnesium hydroxide (MILK OF MAGNESIA) 400 MG/5ML suspension Take 15 mLs by mouth daily as needed for mild constipation.     metoCLOPramide (REGLAN) 5 MG tablet Take 5 mg by mouth 2 (two) times daily. Take it Before meals  metoprolol tartrate (LOPRESSOR) 25 MG tablet Take 12.5 mg by mouth 2 (two) times daily.     midodrine (PROAMATINE) 2.5 MG tablet Take 1 tablet (2.5 mg total) by mouth 2 (two) times daily as needed (if sbp <100 on standing.). 4 tablet 0   omeprazole (PRILOSEC) 20 MG capsule Take 1 capsule (20 mg total) by mouth daily. 30 capsule 1   Povidone, PF, (IVIZIA DRY EYES) 0.5 % SOLN Place 1 drop into both eyes daily.     predniSONE (DELTASONE) 10 MG tablet 5 tablet p.o. daily for 1 week followed by 4 tablets p.o. daily for 1 week followed by 3 tablets p.o. daily for 1 week followed by 2 tablets p.o. daily for 1 week followed by 1 tablet p.o. daily for 1 week. 105 tablet 0   prochlorperazine (COMPAZINE) 10 MG tablet Take 1 tablet (10 mg total) by mouth every 6 (six) hours as needed. 30 tablet 2   No current facility-administered medications on file prior to visit.    Allergies:   Allergies  Allergen Reactions   Irbesartan Nausea Only    Physical Exam General: Frail cachectic elderly Caucasian lady, seated, in no evident distress Head: head normocephalic and atraumatic.   Neck: supple with no carotid or supraclavicular  bruits Cardiovascular: regular rate and rhythm, no murmurs Musculoskeletal: no deformity Skin:  no rash/petichiae Vascular:  Normal pulses all extremities  Neurologic Exam Mental Status: Awake and fully alert. Oriented to place and time. Recent and remote memory intact. Attention span, concentration and fund of knowledge appropriate. Mood and affect appropriate.  Cranial Nerves: Fundoscopic exam reveals sharp disc margins. Pupils equal, briskly reactive to light. Extraocular movements full without nystagmus.  Saccadic dysmetria on lateral gaze bilaterally.  Visual fields full to confrontation. Hearing diminished bilaterally. Facial sensation intact. Face, tongue, palate moves normally and symmetrically.  Motor: Normal bulk and tone. Normal strength in all tested extremity muscles. Sensory.: intact to touch , pinprick , position and vibratory sensation.  Coordination: Rapid alternating movements normal in all extremities. Finger-to-nose and heel-to-shin performed accurately bilaterally. Gait and Station: Arises from chair without difficulty. Stance is normal. Gait demonstrates broad-based gait using a walker..  Not able to heel, toe and tandem walk without difficulty.  Reflexes: 1+ and symmetric. Toes downgoing.   NIHSS  0 Modified Rankin  2   ASSESSMENT: 88 year old Caucasian lady with subacute gait and balance difficulties in October 2023 likely multifactorial possibly from chemotherapy and incidental finding on MRI of silent left cerebellar infarct of Cryptogenic etiology.     PLAN:I had a long d/w patient about his recent silent cerebellar stroke, risk for recurrent stroke/TIAs, personally independently reviewed imaging studies and stroke evaluation results and answered questions.follow results ongoing 30-day heart monitoring.  Continue aspirin 81 mg daily  for secondary stroke prevention and maintain strict control of hypertension with blood pressure goal below 130/90, diabetes with  hemoglobin A1c goal below 6.5% and lipids with LDL cholesterol goal below 70 mg/dL. I also advised the patient to eat a healthy diet with plenty of whole grains, cereals, fruits and vegetables, exercise regularly and maintain ideal body weight.  Discontinue midodrine patient blood pressure has been quite high.  Continue ongoing physical occupational therapy.  I feel patient balance issues are longstanding and likely recently aggravated by carboplatin chemotherapy.  Followup in the future with me in 6 months or call earlier if necessary.  Greater than 50% time during this 45-minute consultation visit was spent on counseling and coordination of care  about cerebellar stroke and answering questions. Antony Contras, MD  Note: This document was prepared with digital dictation and possible smart phrase technology. Any transcriptional errors that result from this process are unintentional.

## 2022-04-26 ENCOUNTER — Other Ambulatory Visit: Payer: Self-pay

## 2022-04-26 DIAGNOSIS — M6281 Muscle weakness (generalized): Secondary | ICD-10-CM | POA: Diagnosis not present

## 2022-04-26 DIAGNOSIS — I639 Cerebral infarction, unspecified: Secondary | ICD-10-CM | POA: Diagnosis not present

## 2022-04-26 DIAGNOSIS — I63442 Cerebral infarction due to embolism of left cerebellar artery: Secondary | ICD-10-CM | POA: Diagnosis not present

## 2022-04-26 DIAGNOSIS — R29898 Other symptoms and signs involving the musculoskeletal system: Secondary | ICD-10-CM | POA: Diagnosis not present

## 2022-04-27 DIAGNOSIS — R2689 Other abnormalities of gait and mobility: Secondary | ICD-10-CM | POA: Diagnosis not present

## 2022-04-27 DIAGNOSIS — R278 Other lack of coordination: Secondary | ICD-10-CM | POA: Diagnosis not present

## 2022-04-27 DIAGNOSIS — I63442 Cerebral infarction due to embolism of left cerebellar artery: Secondary | ICD-10-CM | POA: Diagnosis not present

## 2022-04-27 DIAGNOSIS — M6281 Muscle weakness (generalized): Secondary | ICD-10-CM | POA: Diagnosis not present

## 2022-04-27 DIAGNOSIS — I639 Cerebral infarction, unspecified: Secondary | ICD-10-CM | POA: Diagnosis not present

## 2022-04-27 DIAGNOSIS — R2681 Unsteadiness on feet: Secondary | ICD-10-CM | POA: Diagnosis not present

## 2022-04-29 ENCOUNTER — Other Ambulatory Visit (HOSPITAL_COMMUNITY): Payer: Self-pay

## 2022-04-29 DIAGNOSIS — M6281 Muscle weakness (generalized): Secondary | ICD-10-CM | POA: Diagnosis not present

## 2022-04-29 DIAGNOSIS — R29898 Other symptoms and signs involving the musculoskeletal system: Secondary | ICD-10-CM | POA: Diagnosis not present

## 2022-04-29 DIAGNOSIS — I639 Cerebral infarction, unspecified: Secondary | ICD-10-CM | POA: Diagnosis not present

## 2022-04-29 DIAGNOSIS — I63442 Cerebral infarction due to embolism of left cerebellar artery: Secondary | ICD-10-CM | POA: Diagnosis not present

## 2022-05-01 ENCOUNTER — Other Ambulatory Visit: Payer: Medicare PPO

## 2022-05-01 DIAGNOSIS — R29898 Other symptoms and signs involving the musculoskeletal system: Secondary | ICD-10-CM | POA: Diagnosis not present

## 2022-05-01 DIAGNOSIS — M6281 Muscle weakness (generalized): Secondary | ICD-10-CM | POA: Diagnosis not present

## 2022-05-01 DIAGNOSIS — R278 Other lack of coordination: Secondary | ICD-10-CM | POA: Diagnosis not present

## 2022-05-01 DIAGNOSIS — I63442 Cerebral infarction due to embolism of left cerebellar artery: Secondary | ICD-10-CM | POA: Diagnosis not present

## 2022-05-01 DIAGNOSIS — I639 Cerebral infarction, unspecified: Secondary | ICD-10-CM | POA: Diagnosis not present

## 2022-05-01 DIAGNOSIS — R2689 Other abnormalities of gait and mobility: Secondary | ICD-10-CM | POA: Diagnosis not present

## 2022-05-01 DIAGNOSIS — R2681 Unsteadiness on feet: Secondary | ICD-10-CM | POA: Diagnosis not present

## 2022-05-02 DIAGNOSIS — R278 Other lack of coordination: Secondary | ICD-10-CM | POA: Diagnosis not present

## 2022-05-02 DIAGNOSIS — I639 Cerebral infarction, unspecified: Secondary | ICD-10-CM | POA: Diagnosis not present

## 2022-05-02 DIAGNOSIS — I63442 Cerebral infarction due to embolism of left cerebellar artery: Secondary | ICD-10-CM | POA: Diagnosis not present

## 2022-05-02 DIAGNOSIS — M6281 Muscle weakness (generalized): Secondary | ICD-10-CM | POA: Diagnosis not present

## 2022-05-02 DIAGNOSIS — R2689 Other abnormalities of gait and mobility: Secondary | ICD-10-CM | POA: Diagnosis not present

## 2022-05-02 DIAGNOSIS — R2681 Unsteadiness on feet: Secondary | ICD-10-CM | POA: Diagnosis not present

## 2022-05-03 DIAGNOSIS — M6281 Muscle weakness (generalized): Secondary | ICD-10-CM | POA: Diagnosis not present

## 2022-05-03 DIAGNOSIS — R278 Other lack of coordination: Secondary | ICD-10-CM | POA: Diagnosis not present

## 2022-05-03 DIAGNOSIS — I63442 Cerebral infarction due to embolism of left cerebellar artery: Secondary | ICD-10-CM | POA: Diagnosis not present

## 2022-05-03 DIAGNOSIS — R29898 Other symptoms and signs involving the musculoskeletal system: Secondary | ICD-10-CM | POA: Diagnosis not present

## 2022-05-03 DIAGNOSIS — R2689 Other abnormalities of gait and mobility: Secondary | ICD-10-CM | POA: Diagnosis not present

## 2022-05-03 DIAGNOSIS — R2681 Unsteadiness on feet: Secondary | ICD-10-CM | POA: Diagnosis not present

## 2022-05-03 DIAGNOSIS — I639 Cerebral infarction, unspecified: Secondary | ICD-10-CM | POA: Diagnosis not present

## 2022-05-07 DIAGNOSIS — I63442 Cerebral infarction due to embolism of left cerebellar artery: Secondary | ICD-10-CM | POA: Diagnosis not present

## 2022-05-07 DIAGNOSIS — R29898 Other symptoms and signs involving the musculoskeletal system: Secondary | ICD-10-CM | POA: Diagnosis not present

## 2022-05-07 DIAGNOSIS — M6281 Muscle weakness (generalized): Secondary | ICD-10-CM | POA: Diagnosis not present

## 2022-05-07 DIAGNOSIS — I639 Cerebral infarction, unspecified: Secondary | ICD-10-CM | POA: Diagnosis not present

## 2022-05-08 ENCOUNTER — Other Ambulatory Visit: Payer: Medicare PPO

## 2022-05-08 DIAGNOSIS — R29898 Other symptoms and signs involving the musculoskeletal system: Secondary | ICD-10-CM | POA: Diagnosis not present

## 2022-05-08 DIAGNOSIS — I639 Cerebral infarction, unspecified: Secondary | ICD-10-CM | POA: Diagnosis not present

## 2022-05-08 DIAGNOSIS — M6281 Muscle weakness (generalized): Secondary | ICD-10-CM | POA: Diagnosis not present

## 2022-05-08 DIAGNOSIS — I63442 Cerebral infarction due to embolism of left cerebellar artery: Secondary | ICD-10-CM | POA: Diagnosis not present

## 2022-05-09 DIAGNOSIS — I639 Cerebral infarction, unspecified: Secondary | ICD-10-CM | POA: Diagnosis not present

## 2022-05-09 DIAGNOSIS — R278 Other lack of coordination: Secondary | ICD-10-CM | POA: Diagnosis not present

## 2022-05-09 DIAGNOSIS — R29898 Other symptoms and signs involving the musculoskeletal system: Secondary | ICD-10-CM | POA: Diagnosis not present

## 2022-05-09 DIAGNOSIS — R2689 Other abnormalities of gait and mobility: Secondary | ICD-10-CM | POA: Diagnosis not present

## 2022-05-09 DIAGNOSIS — R2681 Unsteadiness on feet: Secondary | ICD-10-CM | POA: Diagnosis not present

## 2022-05-09 DIAGNOSIS — I63442 Cerebral infarction due to embolism of left cerebellar artery: Secondary | ICD-10-CM | POA: Diagnosis not present

## 2022-05-09 DIAGNOSIS — M6281 Muscle weakness (generalized): Secondary | ICD-10-CM | POA: Diagnosis not present

## 2022-05-10 DIAGNOSIS — R278 Other lack of coordination: Secondary | ICD-10-CM | POA: Diagnosis not present

## 2022-05-10 DIAGNOSIS — I63442 Cerebral infarction due to embolism of left cerebellar artery: Secondary | ICD-10-CM | POA: Diagnosis not present

## 2022-05-10 DIAGNOSIS — I639 Cerebral infarction, unspecified: Secondary | ICD-10-CM | POA: Diagnosis not present

## 2022-05-10 DIAGNOSIS — R2689 Other abnormalities of gait and mobility: Secondary | ICD-10-CM | POA: Diagnosis not present

## 2022-05-10 DIAGNOSIS — M6281 Muscle weakness (generalized): Secondary | ICD-10-CM | POA: Diagnosis not present

## 2022-05-10 DIAGNOSIS — R29898 Other symptoms and signs involving the musculoskeletal system: Secondary | ICD-10-CM | POA: Diagnosis not present

## 2022-05-10 DIAGNOSIS — R2681 Unsteadiness on feet: Secondary | ICD-10-CM | POA: Diagnosis not present

## 2022-05-12 ENCOUNTER — Emergency Department (HOSPITAL_COMMUNITY): Payer: Medicare PPO

## 2022-05-12 ENCOUNTER — Inpatient Hospital Stay (HOSPITAL_COMMUNITY)
Admission: EM | Admit: 2022-05-12 | Discharge: 2022-05-20 | DRG: 480 | Disposition: A | Discharge status: Skilled Nursing Facility | Payer: Medicare PPO | Source: Skilled Nursing Facility | Attending: Family Medicine | Admitting: Family Medicine

## 2022-05-12 DIAGNOSIS — R404 Transient alteration of awareness: Secondary | ICD-10-CM | POA: Diagnosis not present

## 2022-05-12 DIAGNOSIS — D539 Nutritional anemia, unspecified: Secondary | ICD-10-CM | POA: Diagnosis present

## 2022-05-12 DIAGNOSIS — R1312 Dysphagia, oropharyngeal phase: Secondary | ICD-10-CM | POA: Diagnosis present

## 2022-05-12 DIAGNOSIS — Z7982 Long term (current) use of aspirin: Secondary | ICD-10-CM

## 2022-05-12 DIAGNOSIS — J704 Drug-induced interstitial lung disorders, unspecified: Secondary | ICD-10-CM | POA: Diagnosis present

## 2022-05-12 DIAGNOSIS — K219 Gastro-esophageal reflux disease without esophagitis: Secondary | ICD-10-CM | POA: Diagnosis not present

## 2022-05-12 DIAGNOSIS — Z87891 Personal history of nicotine dependence: Secondary | ICD-10-CM | POA: Diagnosis not present

## 2022-05-12 DIAGNOSIS — C3492 Malignant neoplasm of unspecified part of left bronchus or lung: Secondary | ICD-10-CM | POA: Diagnosis not present

## 2022-05-12 DIAGNOSIS — M545 Low back pain, unspecified: Secondary | ICD-10-CM | POA: Diagnosis not present

## 2022-05-12 DIAGNOSIS — J984 Other disorders of lung: Secondary | ICD-10-CM

## 2022-05-12 DIAGNOSIS — M1652 Unilateral post-traumatic osteoarthritis, left hip: Secondary | ICD-10-CM | POA: Diagnosis not present

## 2022-05-12 DIAGNOSIS — S72002D Fracture of unspecified part of neck of left femur, subsequent encounter for closed fracture with routine healing: Secondary | ICD-10-CM | POA: Diagnosis not present

## 2022-05-12 DIAGNOSIS — R262 Difficulty in walking, not elsewhere classified: Secondary | ICD-10-CM | POA: Diagnosis not present

## 2022-05-12 DIAGNOSIS — M810 Age-related osteoporosis without current pathological fracture: Secondary | ICD-10-CM | POA: Diagnosis present

## 2022-05-12 DIAGNOSIS — Z806 Family history of leukemia: Secondary | ICD-10-CM

## 2022-05-12 DIAGNOSIS — M25559 Pain in unspecified hip: Secondary | ICD-10-CM | POA: Diagnosis not present

## 2022-05-12 DIAGNOSIS — T451X5A Adverse effect of antineoplastic and immunosuppressive drugs, initial encounter: Secondary | ICD-10-CM | POA: Diagnosis present

## 2022-05-12 DIAGNOSIS — E869 Volume depletion, unspecified: Secondary | ICD-10-CM | POA: Diagnosis present

## 2022-05-12 DIAGNOSIS — S72002A Fracture of unspecified part of neck of left femur, initial encounter for closed fracture: Secondary | ICD-10-CM | POA: Diagnosis not present

## 2022-05-12 DIAGNOSIS — I10 Essential (primary) hypertension: Secondary | ICD-10-CM | POA: Diagnosis present

## 2022-05-12 DIAGNOSIS — R64 Cachexia: Secondary | ICD-10-CM | POA: Diagnosis present

## 2022-05-12 DIAGNOSIS — D62 Acute posthemorrhagic anemia: Secondary | ICD-10-CM | POA: Diagnosis present

## 2022-05-12 DIAGNOSIS — T50905D Adverse effect of unspecified drugs, medicaments and biological substances, subsequent encounter: Secondary | ICD-10-CM | POA: Diagnosis not present

## 2022-05-12 DIAGNOSIS — W1830XA Fall on same level, unspecified, initial encounter: Secondary | ICD-10-CM | POA: Diagnosis present

## 2022-05-12 DIAGNOSIS — Z7401 Bed confinement status: Secondary | ICD-10-CM | POA: Diagnosis not present

## 2022-05-12 DIAGNOSIS — R29898 Other symptoms and signs involving the musculoskeletal system: Secondary | ICD-10-CM | POA: Diagnosis not present

## 2022-05-12 DIAGNOSIS — D72829 Elevated white blood cell count, unspecified: Secondary | ICD-10-CM | POA: Diagnosis present

## 2022-05-12 DIAGNOSIS — R4701 Aphasia: Secondary | ICD-10-CM | POA: Diagnosis not present

## 2022-05-12 DIAGNOSIS — I493 Ventricular premature depolarization: Secondary | ICD-10-CM | POA: Diagnosis present

## 2022-05-12 DIAGNOSIS — Z8249 Family history of ischemic heart disease and other diseases of the circulatory system: Secondary | ICD-10-CM

## 2022-05-12 DIAGNOSIS — R102 Pelvic and perineal pain: Secondary | ICD-10-CM | POA: Diagnosis not present

## 2022-05-12 DIAGNOSIS — J9601 Acute respiratory failure with hypoxia: Secondary | ICD-10-CM | POA: Diagnosis not present

## 2022-05-12 DIAGNOSIS — E785 Hyperlipidemia, unspecified: Secondary | ICD-10-CM | POA: Diagnosis present

## 2022-05-12 DIAGNOSIS — I771 Stricture of artery: Secondary | ICD-10-CM | POA: Diagnosis not present

## 2022-05-12 DIAGNOSIS — Z8673 Personal history of transient ischemic attack (TIA), and cerebral infarction without residual deficits: Secondary | ICD-10-CM

## 2022-05-12 DIAGNOSIS — Z800 Family history of malignant neoplasm of digestive organs: Secondary | ICD-10-CM

## 2022-05-12 DIAGNOSIS — S72142A Displaced intertrochanteric fracture of left femur, initial encounter for closed fracture: Secondary | ICD-10-CM | POA: Diagnosis not present

## 2022-05-12 DIAGNOSIS — T50905A Adverse effect of unspecified drugs, medicaments and biological substances, initial encounter: Secondary | ICD-10-CM

## 2022-05-12 DIAGNOSIS — E43 Unspecified severe protein-calorie malnutrition: Secondary | ICD-10-CM | POA: Insufficient documentation

## 2022-05-12 DIAGNOSIS — C3490 Malignant neoplasm of unspecified part of unspecified bronchus or lung: Secondary | ICD-10-CM | POA: Diagnosis present

## 2022-05-12 DIAGNOSIS — Z681 Body mass index (BMI) 19 or less, adult: Secondary | ICD-10-CM

## 2022-05-12 DIAGNOSIS — C3412 Malignant neoplasm of upper lobe, left bronchus or lung: Secondary | ICD-10-CM | POA: Diagnosis not present

## 2022-05-12 DIAGNOSIS — Z01818 Encounter for other preprocedural examination: Secondary | ICD-10-CM | POA: Diagnosis not present

## 2022-05-12 DIAGNOSIS — Z66 Do not resuscitate: Secondary | ICD-10-CM | POA: Diagnosis present

## 2022-05-12 DIAGNOSIS — J449 Chronic obstructive pulmonary disease, unspecified: Secondary | ICD-10-CM | POA: Diagnosis not present

## 2022-05-12 DIAGNOSIS — K5900 Constipation, unspecified: Secondary | ICD-10-CM | POA: Diagnosis present

## 2022-05-12 DIAGNOSIS — Z79899 Other long term (current) drug therapy: Secondary | ICD-10-CM

## 2022-05-12 DIAGNOSIS — S72102A Unspecified trochanteric fracture of left femur, initial encounter for closed fracture: Secondary | ICD-10-CM | POA: Diagnosis not present

## 2022-05-12 DIAGNOSIS — Z888 Allergy status to other drugs, medicaments and biological substances status: Secondary | ICD-10-CM | POA: Diagnosis not present

## 2022-05-12 DIAGNOSIS — W19XXXA Unspecified fall, initial encounter: Secondary | ICD-10-CM | POA: Diagnosis not present

## 2022-05-12 DIAGNOSIS — R600 Localized edema: Secondary | ICD-10-CM | POA: Diagnosis not present

## 2022-05-12 DIAGNOSIS — E871 Hypo-osmolality and hyponatremia: Secondary | ICD-10-CM | POA: Diagnosis present

## 2022-05-12 LAB — CBC WITH DIFFERENTIAL/PLATELET
Abs Immature Granulocytes: 0.35 10*3/uL — ABNORMAL HIGH (ref 0.00–0.07)
Basophils Absolute: 0.1 10*3/uL (ref 0.0–0.1)
Basophils Relative: 0 %
Eosinophils Absolute: 0 10*3/uL (ref 0.0–0.5)
Eosinophils Relative: 0 %
HCT: 31.4 % — ABNORMAL LOW (ref 36.0–46.0)
Hemoglobin: 10.4 g/dL — ABNORMAL LOW (ref 12.0–15.0)
Immature Granulocytes: 1 %
Lymphocytes Relative: 1 %
Lymphs Abs: 0.2 10*3/uL — ABNORMAL LOW (ref 0.7–4.0)
MCH: 32.2 pg (ref 26.0–34.0)
MCHC: 33.1 g/dL (ref 30.0–36.0)
MCV: 97.2 fL (ref 80.0–100.0)
Monocytes Absolute: 0.9 10*3/uL (ref 0.1–1.0)
Monocytes Relative: 3 %
Neutro Abs: 27.5 10*3/uL — ABNORMAL HIGH (ref 1.7–7.7)
Neutrophils Relative %: 95 %
Platelets: 154 10*3/uL (ref 150–400)
RBC: 3.23 MIL/uL — ABNORMAL LOW (ref 3.87–5.11)
RDW: 20.6 % — ABNORMAL HIGH (ref 11.5–15.5)
WBC: 29 10*3/uL — ABNORMAL HIGH (ref 4.0–10.5)
nRBC: 0 % (ref 0.0–0.2)

## 2022-05-12 LAB — BASIC METABOLIC PANEL
Anion gap: 8 (ref 5–15)
BUN: 27 mg/dL — ABNORMAL HIGH (ref 8–23)
CO2: 22 mmol/L (ref 22–32)
Calcium: 9.3 mg/dL (ref 8.9–10.3)
Chloride: 102 mmol/L (ref 98–111)
Creatinine, Ser: 0.76 mg/dL (ref 0.44–1.00)
GFR, Estimated: >60 mL/min (ref >60)
Glucose, Bld: 154 mg/dL — ABNORMAL HIGH (ref 70–99)
Potassium: 3.9 mmol/L (ref 3.5–5.1)
Sodium: 132 mmol/L — ABNORMAL LOW (ref 135–145)

## 2022-05-12 LAB — I-STAT CHEM 8, ED
BUN: 25 mg/dL — ABNORMAL HIGH (ref 8–23)
Calcium, Ion: 1.34 mmol/L (ref 1.15–1.40)
Chloride: 102 mmol/L (ref 98–111)
Creatinine, Ser: 0.8 mg/dL (ref 0.44–1.00)
Glucose, Bld: 155 mg/dL — ABNORMAL HIGH (ref 70–99)
HCT: 30 % — ABNORMAL LOW (ref 36.0–46.0)
Hemoglobin: 10.2 g/dL — ABNORMAL LOW (ref 12.0–15.0)
Potassium: 3.9 mmol/L (ref 3.5–5.1)
Sodium: 133 mmol/L — ABNORMAL LOW (ref 135–145)
TCO2: 24 mmol/L (ref 22–32)

## 2022-05-12 MED ORDER — HYDROMORPHONE HCL 1 MG/ML IJ SOLN
0.5000 mg | Freq: Once | INTRAMUSCULAR | Status: AC
  Administered 2022-05-12: 0.5 mg via INTRAVENOUS
  Filled 2022-05-12: qty 1

## 2022-05-12 NOTE — ED Triage Notes (Signed)
Patient arrived with complaints of left hip pain today at 4 after an unwitnessed fall. Unable to apply weight. Shortening and rotation noted by EMS

## 2022-05-12 NOTE — ED Provider Notes (Signed)
[facility] DEPT Provider Note   CSN: [id number] Arrival date & time: 05/12/22  2137     History {Add pertinent medical, surgical, social history, OB history to HPI:1} Chief Complaint  Patient presents with   Hip Pain    Shelley Flores is a 88 y.o. female.  Patient had a fall today and complains of left hip pain.  Patient has a history of lung cancer and presently not getting chemo because she has developed a pneumonitis.  She is on prednisone for that  The history is provided by the patient and medical records. No language interpreter was used.  Hip Pain This is a new problem. The current episode started 6 to 12 hours ago. The problem occurs rarely. The problem has been resolved. Pertinent negatives include no chest pain, no abdominal pain and no headaches. Exacerbated by: Movement. She has tried nothing for the symptoms. The treatment provided no relief.       Home Medications Prior to Admission medications   Medication Sig Start Date End Date Taking? Authorizing Provider  acetaminophen (TYLENOL) 325 MG tablet Take 2 tablets (650 mg total) by mouth every 6 (six) hours as needed for mild pain (or Fever >/= 101). 05/11/20   Raiford Noble Latif, DO  aspirin 81 MG tablet Take 81 mg by mouth daily.    [provider]  atorvastatin (LIPITOR) 20 MG tablet Take 20 mg by mouth at bedtime.    [provider]  folic acid (FOLVITE) 1 MG tablet Take 1 tablet (1 mg total) by mouth daily. 02/13/22   Heilingoetter, Cassandra L, PA-C  magnesium hydroxide (MILK OF MAGNESIA) 400 MG/5ML suspension Take 15 mLs by mouth daily as needed for mild constipation.    [provider]  metoCLOPramide (REGLAN) 5 MG tablet Take 5 mg by mouth 2 (two) times daily. Take it Before meals    [provider]  metoprolol tartrate (LOPRESSOR) 25 MG tablet Take 12.5 mg by mouth 2 (two) times daily.    [provider]  omeprazole (PRILOSEC) 20  MG capsule Take 1 capsule (20 mg total) by mouth daily. 04/24/22   Curt Bears, MD  Povidone, PF, (IVIZIA DRY EYES) 0.5 % SOLN Place 1 drop into both eyes daily. 03/02/22   [provider]  predniSONE (DELTASONE) 10 MG tablet 5 tablet p.o. daily for 1 week followed by 4 tablets p.o. daily for 1 week followed by 3 tablets p.o. daily for 1 week followed by 2 tablets p.o. daily for 1 week followed by 1 tablet p.o. daily for 1 week. 04/24/22   Curt Bears, MD  prochlorperazine (COMPAZINE) 10 MG tablet Take 1 tablet (10 mg total) by mouth every 6 (six) hours as needed. 02/13/22   Heilingoetter, Cassandra L, PA-C      Allergies    Irbesartan    Review of Systems   Review of Systems  Constitutional:  Negative for appetite change and fatigue.  HENT:  Negative for congestion, ear discharge and sinus pressure.   Eyes:  Negative for discharge.  Respiratory:  Negative for cough.   Cardiovascular:  Negative for chest pain.  Gastrointestinal:  Negative for abdominal pain and diarrhea.  Genitourinary:  Negative for frequency and hematuria.  Musculoskeletal:  Negative for back pain.       Left hip pain  Skin:  Negative for rash.  Neurological:  Negative for seizures and headaches.  Psychiatric/Behavioral:  Negative for hallucinations.     Physical Exam Updated Vital Signs  BP (!) 148/84 (BP Location: Right Arm)   Pulse 84   Temp 98 F (36.7 C) (Oral)   Resp 18   Ht 5\' 4"  (1.626 m)   Wt 45.4 kg   SpO2 95%   BMI 17.16 kg/m  Physical Exam Vitals and nursing note reviewed.  Constitutional:      Appearance: She is well-developed.  HENT:     Head: Normocephalic.     Nose: Nose normal.  Eyes:     General: No scleral icterus.    Conjunctiva/sclera: Conjunctivae normal.  Neck:     Thyroid: No thyromegaly.  Cardiovascular:     Rate and Rhythm: Normal rate and regular rhythm.     Heart sounds: No murmur heard.    No friction rub. No gallop.  Pulmonary:     Breath sounds:  No stridor. No wheezing or rales.  Chest:     Chest wall: No tenderness.  Abdominal:     General: There is no distension.     Tenderness: There is no abdominal tenderness. There is no rebound.  Musculoskeletal:        General: Normal range of motion.     Cervical back: Neck supple.     Comments: Tender left hip  Lymphadenopathy:     Cervical: No cervical adenopathy.  Skin:    Findings: No erythema or rash.  Neurological:     Mental Status: She is alert and oriented to person, place, and time.     Motor: No abnormal muscle tone.     Coordination: Coordination normal.  Psychiatric:        Behavior: Behavior normal.     ED Results / Procedures / Treatments   Labs (all labs ordered are listed, but only abnormal results are displayed) Labs Reviewed  CBC WITH DIFFERENTIAL/PLATELET  BASIC METABOLIC PANEL  I-STAT CHEM 8, ED    EKG None  Radiology DG Hip Unilat With Pelvis 2-3 Views Left  Result Date: 05/12/2022 CLINICAL DATA:  Fall EXAM: DG HIP (WITH OR WITHOUT PELVIS) 3V LEFT COMPARISON:  None Available. FINDINGS: Intertrochanteric fracture left femoral neck with varus angulation. Evaluation of the pelvis is very limited due to overlying bowel gas and stool. IMPRESSION: Left femoral neck fracture. Electronically Signed   By: Sammie Bench M.D.   On: 05/12/2022 22:22   DG Chest 2 View  Result Date: 05/12/2022 CLINICAL DATA:  Fractured hip EXAM: CHEST - 2 VIEW COMPARISON:  03/01/2022 FINDINGS: Evaluation of the right hemithorax is nondiagnostic due to over penetration. There is evidence of alveolar consolidation left mid to lower lung. There may be left-sided pleural effusion. No gross pneumothorax no definite pneumothorax identified. Cardiac silhouette appears within normal limits. Aorta is tortuous. IMPRESSION: 1. There is suggestion of left basilar consolidation, likely pneumonia. 2. Repeat examination should be considered due to technical limitations. Electronically Signed    By: Sammie Bench M.D.   On: 05/12/2022 22:20    Procedures Procedures  {Document cardiac monitor, telemetry assessment procedure when appropriate:1}  Medications Ordered in ED Medications - No data to display  ED Course/ Medical Decision Making/ A&P  Patient with inner troches fracture left hip.  I spoke with orthopedics Dr. Lucia Gaskins and he will see the patient in the morning.  He wants patient n.p.o. after midnight                         Medical Decision Making Amount and/or Complexity of Data Reviewed Labs: ordered. Radiology:  ordered.  Risk Prescription drug management.   Left intertrochanteric fracture.  Patient will be admitted to medicine with surgery consult tomorrow  {Document critical care time when appropriate:1} {Document review of labs and clinical decision tools ie heart score, Chads2Vasc2 etc:1}  {Document your independent review of radiology images, and any outside records:1} {Document your discussion with family members, caretakers, and with consultants:1} {Document social determinants of health affecting pt's care:1} {Document your decision making why or why not admission, treatments were needed:1} Final Clinical Impression(s) / ED Diagnoses Final diagnoses:  None    Rx / DC Orders ED Discharge Orders     None

## 2022-05-13 ENCOUNTER — Inpatient Hospital Stay (HOSPITAL_COMMUNITY): Payer: Medicare PPO

## 2022-05-13 ENCOUNTER — Other Ambulatory Visit: Payer: Self-pay

## 2022-05-13 ENCOUNTER — Encounter (HOSPITAL_COMMUNITY): Payer: Self-pay | Admitting: Family Medicine

## 2022-05-13 ENCOUNTER — Encounter (HOSPITAL_COMMUNITY)
Admission: EM | Disposition: A | Discharge status: Skilled Nursing Facility | Payer: Self-pay | Source: Skilled Nursing Facility | Attending: Family Medicine

## 2022-05-13 ENCOUNTER — Inpatient Hospital Stay (HOSPITAL_COMMUNITY): Payer: Medicare PPO | Admitting: Certified Registered"

## 2022-05-13 DIAGNOSIS — J449 Chronic obstructive pulmonary disease, unspecified: Secondary | ICD-10-CM

## 2022-05-13 DIAGNOSIS — Z87891 Personal history of nicotine dependence: Secondary | ICD-10-CM

## 2022-05-13 DIAGNOSIS — S72102A Unspecified trochanteric fracture of left femur, initial encounter for closed fracture: Secondary | ICD-10-CM

## 2022-05-13 DIAGNOSIS — T50905A Adverse effect of unspecified drugs, medicaments and biological substances, initial encounter: Secondary | ICD-10-CM | POA: Diagnosis not present

## 2022-05-13 DIAGNOSIS — I10 Essential (primary) hypertension: Secondary | ICD-10-CM

## 2022-05-13 DIAGNOSIS — J984 Other disorders of lung: Secondary | ICD-10-CM | POA: Diagnosis not present

## 2022-05-13 DIAGNOSIS — S72002A Fracture of unspecified part of neck of left femur, initial encounter for closed fracture: Secondary | ICD-10-CM | POA: Diagnosis not present

## 2022-05-13 HISTORY — PX: INTRAMEDULLARY (IM) NAIL INTERTROCHANTERIC: SHX5875

## 2022-05-13 LAB — CBC
HCT: 35 % — ABNORMAL LOW (ref 36.0–46.0)
HCT: 25.5 % — ABNORMAL LOW (ref 36.0–46.0)
Hemoglobin: 11.1 g/dL — ABNORMAL LOW (ref 12.0–15.0)
Hemoglobin: 8.4 g/dL — ABNORMAL LOW (ref 12.0–15.0)
MCH: 32 pg (ref 26.0–34.0)
MCH: 32.7 pg (ref 26.0–34.0)
MCHC: 31.7 g/dL (ref 30.0–36.0)
MCHC: 32.9 g/dL (ref 30.0–36.0)
MCV: 100.9 fL — ABNORMAL HIGH (ref 80.0–100.0)
MCV: 99.2 fL (ref 80.0–100.0)
Platelets: 111 10*3/uL — ABNORMAL LOW (ref 150–400)
Platelets: 71 10*3/uL — ABNORMAL LOW (ref 150–400)
RBC: 3.47 MIL/uL — ABNORMAL LOW (ref 3.87–5.11)
RBC: 2.57 MIL/uL — ABNORMAL LOW (ref 3.87–5.11)
RDW: 20.7 % — ABNORMAL HIGH (ref 11.5–15.5)
RDW: 20.7 % — ABNORMAL HIGH (ref 11.5–15.5)
WBC: 24.3 10*3/uL — ABNORMAL HIGH (ref 4.0–10.5)
WBC: 23.1 10*3/uL — ABNORMAL HIGH (ref 4.0–10.5)
nRBC: 0 % (ref 0.0–0.2)
nRBC: 0 % (ref 0.0–0.2)

## 2022-05-13 LAB — BASIC METABOLIC PANEL
Anion gap: 8 (ref 5–15)
BUN: 23 mg/dL (ref 8–23)
CO2: 23 mmol/L (ref 22–32)
Calcium: 9.3 mg/dL (ref 8.9–10.3)
Chloride: 102 mmol/L (ref 98–111)
Creatinine, Ser: 0.73 mg/dL (ref 0.44–1.00)
GFR, Estimated: >60 mL/min (ref >60)
Glucose, Bld: 143 mg/dL — ABNORMAL HIGH (ref 70–99)
Potassium: 3.9 mmol/L (ref 3.5–5.1)
Sodium: 133 mmol/L — ABNORMAL LOW (ref 135–145)

## 2022-05-13 LAB — VITAMIN B12: Vitamin B-12: 689 pg/mL (ref 180–914)

## 2022-05-13 LAB — CREATININE, SERUM
Creatinine, Ser: 0.42 mg/dL — ABNORMAL LOW (ref 0.44–1.00)
GFR, Estimated: >60 mL/min (ref >60)

## 2022-05-13 LAB — SURGICAL PCR SCREEN
MRSA, PCR: POSITIVE — AB
Staphylococcus aureus: POSITIVE — AB

## 2022-05-13 LAB — ABO/RH: ABO/RH(D): A POS

## 2022-05-13 SURGERY — FIXATION, FRACTURE, INTERTROCHANTERIC, WITH INTRAMEDULLARY ROD
Anesthesia: General | Site: Hip | Laterality: Left

## 2022-05-13 MED ORDER — CEFAZOLIN SODIUM-DEXTROSE 1-4 GM/50ML-% IV SOLN
1.0000 g | Freq: Four times a day (QID) | INTRAVENOUS | Status: AC
  Administered 2022-05-13 – 2022-05-14 (×3): 1 g via INTRAVENOUS
  Filled 2022-05-13 (×3): qty 50

## 2022-05-13 MED ORDER — ROCURONIUM BROMIDE 10 MG/ML (PF) SYRINGE
PREFILLED_SYRINGE | INTRAVENOUS | Status: AC
  Filled 2022-05-13: qty 10

## 2022-05-13 MED ORDER — FENTANYL CITRATE (PF) 250 MCG/5ML IJ SOLN
INTRAMUSCULAR | Status: DC | PRN
  Administered 2022-05-13: 50 ug via INTRAVENOUS

## 2022-05-13 MED ORDER — PROPOFOL 10 MG/ML IV BOLUS
INTRAVENOUS | Status: AC
  Filled 2022-05-13: qty 20

## 2022-05-13 MED ORDER — METOCLOPRAMIDE HCL 5 MG/ML IJ SOLN: 5.0000 mg | Freq: Three times a day (TID) | INTRAMUSCULAR | Status: DC | PRN

## 2022-05-13 MED ORDER — PROPOFOL 10 MG/ML IV BOLUS
INTRAVENOUS | Status: DC | PRN
  Administered 2022-05-13: 100 mg via INTRAVENOUS
  Administered 2022-05-13: 100 ug/kg/min via INTRAVENOUS

## 2022-05-13 MED ORDER — OXYCODONE HCL 5 MG PO TABS
2.5000 mg | ORAL_TABLET | ORAL | Status: DC | PRN
  Administered 2022-05-13 – 2022-05-15 (×6): 5 mg via ORAL
  Filled 2022-05-13 (×7): qty 1

## 2022-05-13 MED ORDER — ONDANSETRON HCL 4 MG/2ML IJ SOLN: 4.0000 mg | Freq: Four times a day (QID) | INTRAMUSCULAR | Status: DC | PRN

## 2022-05-13 MED ORDER — METOCLOPRAMIDE HCL 5 MG PO TABS: 5.0000 mg | ORAL_TABLET | Freq: Three times a day (TID) | ORAL | Status: DC | PRN

## 2022-05-13 MED ORDER — ACETAMINOPHEN 10 MG/ML IV SOLN
INTRAVENOUS | Status: DC | PRN
  Administered 2022-05-13: 650 mg via INTRAVENOUS

## 2022-05-13 MED ORDER — ONDANSETRON HCL 4 MG PO TABS: 4.0000 mg | ORAL_TABLET | Freq: Four times a day (QID) | ORAL | Status: DC | PRN

## 2022-05-13 MED ORDER — OXYCODONE HCL 5 MG/5ML PO SOLN: 5.0000 mg | Freq: Once | ORAL | Status: DC | PRN

## 2022-05-13 MED ORDER — METOPROLOL TARTRATE 5 MG/5ML IV SOLN
5.0000 mg | Freq: Three times a day (TID) | INTRAVENOUS | Status: DC | PRN
  Administered 2022-05-13: 5 mg via INTRAVENOUS
  Filled 2022-05-13: qty 5

## 2022-05-13 MED ORDER — CEFAZOLIN SODIUM-DEXTROSE 2-4 GM/100ML-% IV SOLN
INTRAVENOUS | Status: AC
  Filled 2022-05-13: qty 100

## 2022-05-13 MED ORDER — ENOXAPARIN SODIUM 40 MG/0.4ML IJ SOSY: 40.0000 mg | PREFILLED_SYRINGE | INTRAMUSCULAR | Status: DC

## 2022-05-13 MED ORDER — SUCCINYLCHOLINE CHLORIDE 200 MG/10ML IV SOSY
PREFILLED_SYRINGE | INTRAVENOUS | Status: AC
  Filled 2022-05-13: qty 10

## 2022-05-13 MED ORDER — 0.9 % SODIUM CHLORIDE (POUR BTL) OPTIME
TOPICAL | Status: DC | PRN
  Administered 2022-05-13: 1000 mL

## 2022-05-13 MED ORDER — LIDOCAINE 2% (20 MG/ML) 5 ML SYRINGE
INTRAMUSCULAR | Status: DC | PRN
  Administered 2022-05-13: 40 mg via INTRAVENOUS

## 2022-05-13 MED ORDER — ACETAMINOPHEN 10 MG/ML IV SOLN
INTRAVENOUS | Status: AC
  Filled 2022-05-13: qty 100

## 2022-05-13 MED ORDER — STERILE WATER FOR IRRIGATION IR SOLN
Status: DC | PRN
  Administered 2022-05-13: 1000 mL

## 2022-05-13 MED ORDER — FENTANYL CITRATE PF 50 MCG/ML IJ SOSY: 25.0000 ug | PREFILLED_SYRINGE | INTRAMUSCULAR | Status: DC | PRN

## 2022-05-13 MED ORDER — OXYCODONE HCL 5 MG PO TABS: 5.0000 mg | ORAL_TABLET | Freq: Once | ORAL | Status: DC | PRN

## 2022-05-13 MED ORDER — CHLORHEXIDINE GLUCONATE 0.12 % MT SOLN
15.0000 mL | Freq: Once | OROMUCOSAL | Status: AC
  Administered 2022-05-13: 15 mL via OROMUCOSAL

## 2022-05-13 MED ORDER — ATORVASTATIN CALCIUM 20 MG PO TABS
20.0000 mg | ORAL_TABLET | Freq: Every day | ORAL | Status: DC
  Administered 2022-05-13 – 2022-05-19 (×5): 20 mg via ORAL
  Filled 2022-05-13 (×7): qty 1

## 2022-05-13 MED ORDER — LACTATED RINGERS IV SOLN: INTRAVENOUS | Status: DC | PRN

## 2022-05-13 MED ORDER — PHENYLEPHRINE HCL-NACL 20-0.9 MG/250ML-% IV SOLN
INTRAVENOUS | Status: DC | PRN
  Administered 2022-05-13: 50 ug/min via INTRAVENOUS

## 2022-05-13 MED ORDER — PROPOFOL 1000 MG/100ML IV EMUL
INTRAVENOUS | Status: AC
  Filled 2022-05-13: qty 100

## 2022-05-13 MED ORDER — PHENYLEPHRINE HCL (PRESSORS) 10 MG/ML IV SOLN
INTRAVENOUS | Status: AC
  Filled 2022-05-13: qty 1

## 2022-05-13 MED ORDER — ENOXAPARIN SODIUM 30 MG/0.3ML IJ SOSY
30.0000 mg | PREFILLED_SYRINGE | INTRAMUSCULAR | Status: DC
  Administered 2022-05-14 – 2022-05-20 (×7): 30 mg via SUBCUTANEOUS
  Filled 2022-05-13 (×7): qty 0.3

## 2022-05-13 MED ORDER — MUPIROCIN 2 % EX OINT
1.0000 | TOPICAL_OINTMENT | Freq: Two times a day (BID) | CUTANEOUS | Status: AC
  Administered 2022-05-13 – 2022-05-17 (×10): 1 via NASAL
  Filled 2022-05-13: qty 22

## 2022-05-13 MED ORDER — DEXAMETHASONE SODIUM PHOSPHATE 10 MG/ML IJ SOLN
INTRAMUSCULAR | Status: DC | PRN
  Administered 2022-05-13: 8 mg via INTRAVENOUS

## 2022-05-13 MED ORDER — FENTANYL CITRATE (PF) 100 MCG/2ML IJ SOLN
INTRAMUSCULAR | Status: AC
  Filled 2022-05-13: qty 2

## 2022-05-13 MED ORDER — METHOCARBAMOL 1000 MG/10ML IJ SOLN: 500.0000 mg | Freq: Four times a day (QID) | INTRAVENOUS | Status: DC | PRN

## 2022-05-13 MED ORDER — POLYETHYLENE GLYCOL 3350 17 G PO PACK
17.0000 g | PACK | Freq: Every day | ORAL | Status: DC | PRN
  Filled 2022-05-13: qty 1

## 2022-05-13 MED ORDER — CEFAZOLIN SODIUM-DEXTROSE 2-3 GM-%(50ML) IV SOLR
INTRAVENOUS | Status: DC | PRN
  Administered 2022-05-13: 2 g via INTRAVENOUS

## 2022-05-13 MED ORDER — DEXAMETHASONE SODIUM PHOSPHATE 10 MG/ML IJ SOLN
INTRAMUSCULAR | Status: AC
  Filled 2022-05-13: qty 1

## 2022-05-13 MED ORDER — DOCUSATE SODIUM 100 MG PO CAPS
100.0000 mg | ORAL_CAPSULE | Freq: Two times a day (BID) | ORAL | Status: DC
  Administered 2022-05-13 – 2022-05-15 (×5): 100 mg via ORAL
  Filled 2022-05-13 (×5): qty 1

## 2022-05-13 MED ORDER — PREDNISONE 5 MG PO TABS
30.0000 mg | ORAL_TABLET | Freq: Every day | ORAL | Status: DC
  Administered 2022-05-13 – 2022-05-20 (×8): 30 mg via ORAL
  Filled 2022-05-13 (×8): qty 2

## 2022-05-13 MED ORDER — DIPHENHYDRAMINE HCL 12.5 MG/5ML PO ELIX: 12.5000 mg | ORAL_SOLUTION | ORAL | Status: DC | PRN

## 2022-05-13 MED ORDER — ONDANSETRON HCL 4 MG/2ML IJ SOLN: 4.0000 mg | Freq: Once | INTRAMUSCULAR | Status: DC | PRN

## 2022-05-13 MED ORDER — METHOCARBAMOL 500 MG PO TABS
500.0000 mg | ORAL_TABLET | Freq: Four times a day (QID) | ORAL | Status: DC | PRN
  Administered 2022-05-15 (×2): 500 mg via ORAL
  Filled 2022-05-13 (×2): qty 1

## 2022-05-13 MED ORDER — FENTANYL CITRATE PF 50 MCG/ML IJ SOSY
25.0000 ug | PREFILLED_SYRINGE | INTRAMUSCULAR | Status: DC | PRN
  Administered 2022-05-13 (×2): 25 ug via INTRAVENOUS
  Filled 2022-05-13 (×2): qty 1

## 2022-05-13 MED ORDER — LACTATED RINGERS IV SOLN: Freq: Once | INTRAVENOUS | Status: AC

## 2022-05-13 MED ORDER — ONDANSETRON HCL 4 MG/2ML IJ SOLN
INTRAMUSCULAR | Status: DC | PRN
  Administered 2022-05-13: 4 mg via INTRAVENOUS

## 2022-05-13 MED ORDER — ONDANSETRON HCL 4 MG/2ML IJ SOLN
INTRAMUSCULAR | Status: AC
  Filled 2022-05-13: qty 2

## 2022-05-13 SURGICAL SUPPLY — 32 items
APL PRP STRL LF DISP 70% ISPRP (MISCELLANEOUS) ×1
BAG COUNTER SPONGE SURGICOUNT (BAG) ×1 IMPLANT
BAG SPNG CNTER NS LX DISP (BAG) ×1
BIT DRILL 4.0X280 (BIT) IMPLANT
BOOTIES KNEE HIGH SLOAN (MISCELLANEOUS) ×1 IMPLANT
CHLORAPREP W/TINT 26 (MISCELLANEOUS) ×1 IMPLANT
COVER PERINEAL POST (MISCELLANEOUS) ×1 IMPLANT
COVER SURGICAL LIGHT HANDLE (MISCELLANEOUS) ×1 IMPLANT
DRAPE C-ARM 42X120 X-RAY (DRAPES) ×1 IMPLANT
DRAPE C-ARMOR (DRAPES) ×1 IMPLANT
DRAPE STERI IOBAN 125X83 (DRAPES) ×1 IMPLANT
DRESSING MEPILEX FLEX 4X4 (GAUZE/BANDAGES/DRESSINGS) ×3 IMPLANT
DRSG MEPILEX FLEX 4X4 (GAUZE/BANDAGES/DRESSINGS) ×4
ELECT REM PT RETURN 15FT ADLT (MISCELLANEOUS) ×1 IMPLANT
GAUZE PAD ABD 8X10 STRL (GAUZE/BANDAGES/DRESSINGS) IMPLANT
GLOVE BIOGEL PI IND STRL 8 (GLOVE) ×1 IMPLANT
GLOVE SURG LX STRL 7.5 STRW (GLOVE) ×1 IMPLANT
KIT BASIN OR (CUSTOM PROCEDURE TRAY) ×1 IMPLANT
KIT TURNOVER KIT A (KITS) IMPLANT
NAIL 9X130X20 SHORT (Nail) IMPLANT
NS IRRIG 1000ML POUR BTL (IV SOLUTION) ×1 IMPLANT
PACK GENERAL/GYN (CUSTOM PROCEDURE TRAY) ×1 IMPLANT
PIN GUIDE THRD AR 3.2X330 (PIN) IMPLANT
PROTECTOR NERVE ULNAR (MISCELLANEOUS) ×2 IMPLANT
SCREW CORTICAL 5.0X32 (Screw) IMPLANT
SCREW SOLID LEFTY LAG 10.5X90 (Screw) IMPLANT
STAPLER VISISTAT 35W (STAPLE) ×1 IMPLANT
SUT MNCRL AB 3-0 PS2 18 (SUTURE) ×1 IMPLANT
TAPE CLOTH SURG 6X10 WHT LF (GAUZE/BANDAGES/DRESSINGS) IMPLANT
TOOL ACTIVATION (INSTRUMENTS) IMPLANT
TOWEL OR 17X26 10 PK STRL BLUE (TOWEL DISPOSABLE) ×1 IMPLANT
WATER STERILE IRR 1000ML POUR (IV SOLUTION) ×2 IMPLANT

## 2022-05-13 NOTE — Progress Notes (Signed)
[facility] PROGRESS NOTE    Progress Note  Shelley Flores  XHB:[id number] DOB: Mar 22, 1933 DOA: 05/12/2022 PCP: [name], MD     Brief Narrative:   Shelley Flores is an 89 y.o. female past medical history significant for CVA, non-small cell stage IV lung cancer with complicated treatment drug-induced pneumonitis, presents to the ED with left hip pain after mechanical fall x-ray showed a left femoral fracture   Assessment/Plan:   Closed left hip fracture, initial encounter [facility]): She is moderate to low risk for cardiopulmonary complications. Orthopedic surgery was consulted . Currently n.p.o. hold aspirin. Continue narcotics for analgesics  Drug-induced pneumonitis: Tagrisso on hold on 4 weeks of steroid taper started on 04/24/2022 for drug-induced pneumonitis. Continue prednisone.  History of CVA: Hold aspirin perioperatively continue statins.  Leukocytosis: In the setting of steroids and Closed left hip fracture.  Macrocytic anemia: Check B12 and folate.   DVT prophylaxis: lovenox Family Communication:non Status is: Inpatient Remains inpatient appropriate because: Acute left hip fracture    Code Status:     Code Status Orders  (From admission, onward)           Start     Ordered   05/13/22 0211  Do not attempt resuscitation (DNR)  Continuous       Question Answer Comment  If patient has no pulse and is not breathing Do Not Attempt Resuscitation   If patient has a pulse and/or is breathing: Medical Treatment Goals LIMITED ADDITIONAL INTERVENTIONS: Use medication/IV fluids and cardiac monitoring as indicated; Do not use intubation or mechanical ventilation (DNI), also provide comfort medications.  Transfer to Progressive/Stepdown as indicated, avoid Intensive Care.   Consent: Discussion documented in EHR or advanced directives reviewed      05/13/22 0212           Code Status History     Date Active Date Inactive Code Status  Order ID Comments User Context   02/26/2022 1659 03/02/2022 2343 DNR [id number]  Eugenie Filler, MD ED   05/02/2020 0015 05/12/2020 1620 DNR [id number]  [name], MD ED   05/01/2020 2354 05/02/2020 0015 Full Code [id number]  [name], MD ED      Advance Directive Documentation    Flowsheet Row Most Recent Value  Type of Advance Directive Healthcare Power of Bells, Living will, Out of facility DNR (pink MOST or yellow form)  Pre-existing out of facility DNR order (yellow form or pink MOST form) --  "MOST" Form in Place? --         IV Access:   Peripheral IV   Procedures and diagnostic studies:   CT Hip Left Wo Contrast  Result Date: 05/12/2022 CLINICAL DATA:  Hip trauma, left femoral neck fracture.  Fall. EXAM: CT OF THE LEFT HIP WITHOUT CONTRAST TECHNIQUE: Multidetector CT imaging of the left hip was performed according to the standard protocol. Multiplanar CT image reconstructions were also generated. RADIATION DOSE REDUCTION: This exam was performed according to the departmental dose-optimization program which includes automated exposure control, adjustment of the mA and/or kV according to patient size and/or use of iterative reconstruction technique. COMPARISON:  Radiograph performed earlier on the same date. FINDINGS: Bones/Joint/Cartilage There is mildly comminuted intertrochanteric fracture of the left femur with superolateral displacement of the distal femur. The femoral head is located within the acetabulum. There is no femoral head dislocation. No appreciable pelvic fracture. Appreciable joint effusion. Ligaments Suboptimally assessed by CT. Muscles and Tendons Small hematoma about the fracture site. Generalized  muscle atrophy. No evidence of tendon tear. Soft tissues Mild subcutaneous soft tissue edema about the lateral aspect of the hip. No fluid collection or hematoma. IMPRESSION: Mildly comminuted intertrochanteric fracture of the left femur with  superolateral displacement of the distal femur. No femoral head dislocation. Electronically Signed   By: Keane Police D.O.   On: 05/12/2022 23:48   DG Hip Unilat With Pelvis 2-3 Views Left  Result Date: 05/12/2022 CLINICAL DATA:  Fall EXAM: DG HIP (WITH OR WITHOUT PELVIS) 3V LEFT COMPARISON:  None Available. FINDINGS: Intertrochanteric fracture left femoral neck with varus angulation. Evaluation of the pelvis is very limited due to overlying bowel gas and stool. IMPRESSION: Left femoral neck fracture. Electronically Signed   By: Sammie Bench M.D.   On: 05/12/2022 22:22   DG Chest 2 View  Result Date: 05/12/2022 CLINICAL DATA:  Fractured hip EXAM: CHEST - 2 VIEW COMPARISON:  03/01/2022 FINDINGS: Evaluation of the right hemithorax is nondiagnostic due to over penetration. There is evidence of alveolar consolidation left mid to lower lung. There may be left-sided pleural effusion. No gross pneumothorax no definite pneumothorax identified. Cardiac silhouette appears within normal limits. Aorta is tortuous. IMPRESSION: 1. There is suggestion of left basilar consolidation, likely pneumonia. 2. Repeat examination should be considered due to technical limitations. Electronically Signed   By: Sammie Bench M.D.   On: 05/12/2022 22:20     Medical Consultants:   None.   Subjective:    Shelley Flores relates her pain is controlled as long she does not move  Objective:    Vitals:   05/13/22 0130 05/13/22 0145 05/13/22 0248 05/13/22 0515  BP: (!) 151/119 (!) 164/90 (!) 176/92 (!) 164/89  Pulse: (!) 102 (!) 101 (!) 103 95  Resp: (!) 21 (!) 22 20 18   Temp:  98.1 F (36.7 C) 97.9 F (36.6 C) (!) 97.5 F (36.4 C)  TempSrc:   Oral Oral  SpO2: 97% 97% 98% 97%  Weight:   41.5 kg   Height:   5\' 4"  (1.626 m)    SpO2: 97 %   Intake/Output Summary (Last 24 hours) at 05/13/2022 0814 Last data filed at 05/13/2022 0200 Gross per 24 hour  Intake 0 ml  Output --  Net 0 ml   Filed Weights    05/12/22 2154 05/13/22 0248  Weight: 45.4 kg 41.5 kg    Exam: General exam: In no acute distress. Respiratory system: Good air movement and clear to auscultation. Cardiovascular system: S1 & S2 heard, RRR. No JVD. Gastrointestinal system: Abdomen is nondistended, soft and nontender.  Extremities: No pedal edema. Skin: No rashes, lesions or ulcers Psychiatry: Judgement and insight appear normal. Mood & affect appropriate.    Data Reviewed:    Labs: Basic Metabolic Panel: Recent Labs  Lab 05/12/22 2253 05/12/22 2302 05/13/22 0338  NA 132* 133* 133*  K 3.9 3.9 3.9  CL 102 102 102  CO2 22  --  23  GLUCOSE 154* 155* 143*  BUN 27* 25* 23  CREATININE 0.76 0.80 0.73  CALCIUM 9.3  --  9.3   GFR Estimated Creatinine Clearance: 32.5 mL/min (by C-G formula based on SCr of 0.73 mg/dL). Liver Function Tests: No results for input(s): "AST", "ALT", "ALKPHOS", "BILITOT", "PROT", "ALBUMIN" in the last 168 hours. No results for input(s): "LIPASE", "AMYLASE" in the last 168 hours. No results for input(s): "AMMONIA" in the last 168 hours. Coagulation profile No results for input(s): "INR", "PROTIME" in the last 168 hours. COVID-19 Labs  No results for input(s): "DDIMER", "FERRITIN", "LDH", "CRP" in the last 72 hours.  Lab Results  Component Value Date   SARSCOV2NAA NEGATIVE 02/26/2022   [place] NEGATIVE 07/03/2020   [place] NEGATIVE 05/11/2020   [place] NEGATIVE 05/01/2020    CBC: Recent Labs  Lab 05/12/22 2253 05/12/22 2302 05/13/22 0338  WBC 29.0*  --  24.3*  NEUTROABS 27.5*  --   --   HGB 10.4* 10.2* 11.1*  HCT 31.4* 30.0* 35.0*  MCV 97.2  --  100.9*  PLT 154  --  111*   Cardiac Enzymes: No results for input(s): "CKTOTAL", "CKMB", "CKMBINDEX", "TROPONINI" in the last 168 hours. BNP (last 3 results) No results for input(s): "PROBNP" in the last 8760 hours. CBG: No results for input(s): "GLUCAP" in the last 168 hours. D-Dimer: No results for  input(s): "DDIMER" in the last 72 hours. Hgb A1c: No results for input(s): "HGBA1C" in the last 72 hours. Lipid Profile: No results for input(s): "CHOL", "HDL", "LDLCALC", "TRIG", "CHOLHDL", "LDLDIRECT" in the last 72 hours. Thyroid function studies: No results for input(s): "TSH", "T4TOTAL", "T3FREE", "THYROIDAB" in the last 72 hours.  Invalid input(s): "FREET3" Anemia work up: No results for input(s): "VITAMINB12", "FOLATE", "FERRITIN", "TIBC", "IRON", "RETICCTPCT" in the last 72 hours. Sepsis Labs: Recent Labs  Lab 05/12/22 2253 05/13/22 0338  WBC 29.0* 24.3*   Microbiology No results found for this or any previous visit (from the past 240 hour(s)).   Medications:    atorvastatin  20 mg Oral QHS   mupirocin ointment  1 Application Nasal BID   predniSONE  30 mg Oral Q breakfast   Continuous Infusions:    LOS: 1 day   Charlynne Cousins  [facility]  05/13/2022, 8:14 AM

## 2022-05-13 NOTE — Plan of Care (Signed)
Problem: Education: Goal: Knowledge of General Education information will improve Description: Including pain rating scale, medication(s)/side effects and non-pharmacologic comfort measures Outcome: Progressing   Problem: Clinical Measurements: Goal: Ability to maintain clinical measurements within normal limits will improve Outcome: Progressing   Problem: Pain Managment: Goal: General experience of comfort will improve Outcome: [place], RN 05/13/22 10:26 AM

## 2022-05-13 NOTE — Consult Note (Signed)
Reason for Consult: Left hip fracture Referring Physician: [facility]  Shelley Flores is an 89 y.o. female.  HPI: Daylynn suffered a fall yesterday at assisted living facility.  She reports immediate left hip pain and difficulty bearing weight.  She denies any other injuries.  She was transported to the ED where radiographs were taken as well as subsequent CT scan which revealed a left hip fracture.  He was admitted to the hospitalist team.  She has been n.p.o. and pain reasonably controlled on analgesics.  Orthopedics consulted for further evaluation management.  At present, the patient states she was ambulatory at baseline with use of a rolling walker.  She does have a history of cancer and feels that her health has declined over the last 2 years.  She stays permanently at assisting living facility.  She reports pain is isolated to the left hip today and is worse with any attempted movement.  She has not eaten since yesterday.  No other complaints.  Past Medical History:  Diagnosis Date   Bruit    Abdominal bruit - Abdominal aorta/renal duplex Doppler evaluation 12/07/03 -Mildly abnormal evaluation. 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 This is consistant w/median arcuate ligament compression syndrome. *Right & Left Kidney: Essentially equal in size, symmetrical in shape w/no significant abnormalities visualized. *Right & Left Renal Arteries: No significant abnormalities.   Cancer ([place])    Edema extremities    LE edema    H/O myocardial perfusion scan 02/20/00   To rule out ischemia - Negative adequate Bruce protocol exercise stress test with a deconditioned exercise response and normal static myocardial perfusion images with EF calculated by QGS of 77%. Represents a low risk study.   Hyperlipidemia    Hypertension    Osteoarthritis    Osteoporosis    Pleural effusion 04/2020   Valvular heart disease    Mild valvular heart disease by Echo in 2010, all  mild and symptomatic, including concentric LVH, MR, TR, and AI with pulmonary artery pressure of 32. EF was normal.    Past Surgical History:  Procedure Laterality Date   Abdominal aorta/Renal duplex Doppler Evaluation  12/07/03   For abdominal bruit. Mildly abnormal evaluation. (See bruit in medical history)   BREAST EXCISIONAL BIOPSY Left 1966   BREAST EXCISIONAL BIOPSY Left 1999   CATARACT EXTRACTION  2003   CHEST TUBE INSERTION N/A 07/04/2020   Procedure: REMOVAL PLEURAL DRAINAGE CATHETER;  Surgeon: Garner Nash, DO;  Location: [place];  Service: Pulmonary;  Laterality: N/A;   COLONOSCOPY  10/22/2004   DEXA Bone Scan  06/23/2013   IR PERC PLEURAL DRAIN W/INDWELL CATH W/IMG GUIDE  05/10/2020   IR THORACENTESIS ASP PLEURAL SPACE W/IMG GUIDE  05/04/2020    Family History  Problem Relation Age of Onset   Leukemia Mother 17   Cancer Father        esophagial cancer   Heart failure Maternal Grandmother    Tuberculosis Maternal Grandfather    Heart disease Paternal Grandmother    Cancer Paternal Grandfather     Social History:  reports that she quit smoking about 60 years ago. Her smoking use included cigarettes. She has a 1.50 pack-year smoking history. She has never used smokeless tobacco. She reports current alcohol use of about 2.0 standard drinks of alcohol per week. She reports that she does not use drugs.  Allergies:  Allergies  Allergen Reactions   Irbesartan Nausea Only    Medications: I have reviewed the  patient's current medications.  Results for orders placed or performed during the hospital encounter of 05/12/22 (from the past 48 hour(s))  CBC with Differential     Status: Abnormal   Collection Time: 05/12/22 10:53 PM  Result Value Ref Range   WBC 29.0 (H) 4.0 - 10.5 K/uL   RBC 3.23 (L) 3.87 - 5.11 MIL/uL   Hemoglobin 10.4 (L) 12.0 - 15.0 g/dL   HCT 31.4 (L) 36.0 - 46.0 %   MCV 97.2 80.0 - 100.0 fL   MCH 32.2 26.0 - 34.0 pg   MCHC 33.1 30.0 - 36.0 g/dL    RDW 20.6 (H) 11.5 - 15.5 %   Platelets 154 150 - 400 K/uL   nRBC 0.0 0.0 - 0.2 %   Neutrophils Relative % 95 %   Neutro Abs 27.5 (H) 1.7 - 7.7 K/uL   Lymphocytes Relative 1 %   Lymphs Abs 0.2 (L) 0.7 - 4.0 K/uL   Monocytes Relative 3 %   Monocytes Absolute 0.9 0.1 - 1.0 K/uL   Eosinophils Relative 0 %   Eosinophils Absolute 0.0 0.0 - 0.5 K/uL   Basophils Relative 0 %   Basophils Absolute 0.1 0.0 - 0.1 K/uL   Immature Granulocytes 1 %   Abs Immature Granulocytes 0.35 (H) 0.00 - 0.07 K/uL   Acanthocytes PRESENT    Ovalocytes PRESENT     Comment: Performed at [facility], [place] [address], [place], [place] [address]  Basic metabolic panel     Status: Abnormal   Collection Time: 05/12/22 10:53 PM  Result Value Ref Range   Sodium 132 (L) 135 - 145 mmol/L   Potassium 3.9 3.5 - 5.1 mmol/L   Chloride 102 98 - 111 mmol/L   CO2 22 22 - 32 mmol/L   Glucose, Bld 154 (H) 70 - 99 mg/dL    Comment: Glucose reference range applies only to samples taken after fasting for at least 8 hours.   BUN 27 (H) 8 - 23 mg/dL   Creatinine, Ser 0.76 0.44 - 1.00 mg/dL   Calcium 9.3 8.9 - 10.3 mg/dL   GFR, Estimated >60 >60 mL/min    Comment: (NOTE) Calculated using the CKD-EPI Creatinine Equation (2021)    Anion gap 8 5 - 15    Comment: Performed at [facility], [place] [address] [place], [place] [address]  ABO/Rh     Status: None   Collection Time: 05/12/22 10:53 PM  Result Value Ref Range   ABO/RH(D)      A POS Performed at [facility], [place] [address] [place],  [address]   I-stat chem 8, ED (not at [facility], [facility] or [facility])     Status: Abnormal   Collection Time: 05/12/22 11:02 PM  Result Value Ref Range   Sodium 133 (L) 135 - 145 mmol/L   Potassium 3.9 3.5 - 5.1 mmol/L   Chloride 102 98 - 111 mmol/L   BUN 25 (H) 8 - 23 mg/dL   Creatinine, Ser 0.80 0.44 - 1.00 mg/dL   Glucose, Bld 155 (H) 70 - 99 mg/dL    Comment: Glucose reference  range applies only to samples taken after fasting for at least 8 hours.   Calcium, Ion 1.34 1.15 - 1.40 mmol/L   TCO2 24 22 - 32 mmol/L   Hemoglobin 10.2 (L) 12.0 - 15.0 g/dL   HCT 30.0 (L) 36.0 - 46.0 %  CBC     Status: Abnormal   Collection Time: 05/13/22  3:38 AM  Result Value Ref Range   WBC 24.3 (H) 4.0 - 10.5 K/uL   RBC 3.47 (L) 3.87 - 5.11 MIL/uL   Hemoglobin 11.1 (L) 12.0 - 15.0 g/dL   HCT 35.0 (L) 36.0 - 46.0 %   MCV 100.9 (H) 80.0 - 100.0 fL   MCH 32.0 26.0 - 34.0 pg   MCHC 31.7 30.0 - 36.0 g/dL   RDW 20.7 (H) 11.5 - 15.5 %   Platelets 111 (L) 150 - 400 K/uL   nRBC 0.0 0.0 - 0.2 %    Comment: Performed at [facility], [place] [address], [place], [place] [address]  Basic metabolic panel     Status: Abnormal   Collection Time: 05/13/22  3:38 AM  Result Value Ref Range   Sodium 133 (L) 135 - 145 mmol/L   Potassium 3.9 3.5 - 5.1 mmol/L   Chloride 102 98 - 111 mmol/L   CO2 23 22 - 32 mmol/L   Glucose, Bld 143 (H) 70 - 99 mg/dL    Comment: Glucose reference range applies only to samples taken after fasting for at least 8 hours.   BUN 23 8 - 23 mg/dL   Creatinine, Ser 0.73 0.44 - 1.00 mg/dL   Calcium 9.3 8.9 - 10.3 mg/dL   GFR, Estimated >60 >60 mL/min    Comment: (NOTE) Calculated using the CKD-EPI Creatinine Equation (2021)    Anion gap 8 5 - 15    Comment: Performed at [facility], [place] [address] [place], [place] [address]  Type and screen [place]     Status: None   Collection Time: 05/13/22  3:38 AM  Result Value Ref Range   ABO/RH(D) A POS    Antibody Screen NEG    Sample Expiration      05/16/2022,2359 Performed at [facility], [place] [address], [place], [place] [address]     CT Hip Left Wo Contrast  Result Date: 05/12/2022 CLINICAL DATA:  Hip trauma, left femoral neck fracture.  Fall. EXAM: CT OF THE LEFT HIP WITHOUT CONTRAST TECHNIQUE: Multidetector CT imaging of the left hip  was performed according to the standard protocol. Multiplanar CT image reconstructions were also generated. RADIATION DOSE REDUCTION: This exam was performed according to the departmental dose-optimization program which includes automated exposure control, adjustment of the mA and/or kV according to patient size and/or use of iterative reconstruction technique. COMPARISON:  Radiograph performed earlier on the same date. FINDINGS: Bones/Joint/Cartilage There is mildly comminuted intertrochanteric fracture of the left femur with superolateral displacement of the distal femur. The femoral head is located within the acetabulum. There is no femoral head dislocation. No appreciable pelvic fracture. Appreciable joint effusion. Ligaments Suboptimally assessed by CT. Muscles and Tendons Small hematoma about the fracture site. Generalized muscle atrophy. No evidence of tendon tear. Soft tissues Mild subcutaneous soft tissue edema about the lateral aspect of the hip. No fluid collection or hematoma. IMPRESSION: Mildly comminuted intertrochanteric fracture of the left femur with superolateral displacement of the distal femur. No femoral head dislocation. Electronically Signed   By: Keane Police D.O.   On: 05/12/2022 23:48   DG Hip Unilat With Pelvis 2-3 Views Left  Result Date: 05/12/2022 CLINICAL DATA:  Fall EXAM: DG HIP (WITH OR WITHOUT PELVIS) 3V LEFT COMPARISON:  None Available. FINDINGS: Intertrochanteric fracture left femoral neck with varus angulation. Evaluation of the pelvis is very limited due to overlying bowel gas and stool. IMPRESSION: Left femoral neck fracture.  Electronically Signed   By: Sammie Bench M.D.   On: 05/12/2022 22:22   DG Chest 2 View  Result Date: 05/12/2022 CLINICAL DATA:  Fractured hip EXAM: CHEST - 2 VIEW COMPARISON:  03/01/2022 FINDINGS: Evaluation of the right hemithorax is nondiagnostic due to over penetration. There is evidence of alveolar consolidation left mid to lower lung.  There may be left-sided pleural effusion. No gross pneumothorax no definite pneumothorax identified. Cardiac silhouette appears within normal limits. Aorta is tortuous. IMPRESSION: 1. There is suggestion of left basilar consolidation, likely pneumonia. 2. Repeat examination should be considered due to technical limitations. Electronically Signed   By: Sammie Bench M.D.   On: 05/12/2022 22:20    Review of Systems  Constitutional:  Negative for fever.  HENT:  Negative for drooling and facial swelling.   Eyes:  Negative for discharge and redness.  Respiratory:  Negative for shortness of breath.   Gastrointestinal:  Negative for vomiting.  Musculoskeletal:  Positive for arthralgias and joint swelling.  Neurological:  Negative for speech difficulty.  Psychiatric/Behavioral:  Negative for agitation, behavioral problems, confusion and decreased concentration.    Blood pressure (!) 164/89, pulse 95, temperature (!) 97.5 F (36.4 C), temperature source Oral, resp. rate 18, height 5\' 4"  (1.626 m), weight 41.5 kg, SpO2 97 %. Physical Exam Vitals reviewed.  Constitutional:      Comments: Frail-appearing  HENT:     Head: Normocephalic and atraumatic.     Nose: Nose normal.     Mouth/Throat:     Mouth: Mucous membranes are dry.  Eyes:     Extraocular Movements: Extraocular movements intact.  Pulmonary:     Effort: No respiratory distress.  Musculoskeletal:        General: Swelling, tenderness and deformity present.     Comments: Examination of the left lower extremity demonstrates tenderness to palpation over the anterior lateral aspect of the left hip.  It appears shortened and externally rotated.  No significant tenderness to palpation otherwise throughout.  He has pain with any attempted motion or movement at the hip.  She is able to plantarflex and dorsiflex the ankle and has good strength here.  She has good sensation to light touch distally throughout the lower extremity.  Her calf is soft  and nontender.  Skin is benign.  Warm and well-perfused distally.  Skin:    General: Skin is warm and dry.  Neurological:     Mental Status: She is alert and oriented to person, place, and time.  Psychiatric:        Mood and Affect: Mood normal.        Behavior: Behavior normal.        Thought Content: Thought content normal.    CT scan and radiographs of the left hip were reviewed.  This shows displaced and comminuted intertrochanteric left hip fracture.  Assessment/Plan: Left displaced and comminuted intertrochanteric hip fracture.  We had a long discussion regarding her left hip fracture and treatment options.  She has significant pain in the left hip and was previously ambulatory at baseline.  We discussed nonoperative versus surgical management.  We discussed the role of surgical management of her left hip fracture to reduce her fracture, promote early ambulation, and assist with pain control.  We discussed risk, benefits, and alternatives to surgery at length.  All questions were invited and answered.  She opted to proceed with surgery.  We will plan IM nail fixation of her left intertrochanteric hip fracture today with Dr. Lucia Gaskins.  The interim, bedrest and keep NPO.  Pain control as needed.  Esaias Cleavenger J Martinique 05/13/2022, 9:18 AM

## 2022-05-13 NOTE — Op Note (Signed)
Shelley Flores female 89 y.o. 05/13/2022  PreOperative Diagnosis: Left intertochanteric hip fracture  PostOperative Diagnosis: same  PROCEDURE: Cephalomedullary nail of left hip fracture  SURGEON: Melony Overly, MD  ASSISTANT: Jesse Martinique, PA-C was necessary for patient positioning, prep, drape, assistance with fracture reduction and placement of hardware as well as closure none  ANESTHESIA: General LMA anesthesia  FINDINGS: Displaced intertrochanteric hip fracture  IMPLANTS: Arthrex short inotrope nail  INDICATIONS:89 y.o. femalehad a fall and sustained a intertrochanteric hip fracture.  She was indicated for surgery due to baseline ambulatory status and significant pain due to the fracture.  Patient does have a history of stage IV lung cancer.  This did not appear to be a pathologic fracture.  Discussion was had with the patient about the surgery and they wish to proceed.  Patient understood the risks, benefits and alternatives to surgery which include but are not limited to wound healing complications, infection, nonunion, malunion, need for further surgery as well as damage to surrounding structures. They also understood the potential for continued pain in that there were no guarantees of acceptable outcome After weighing these risks the patient opted to proceed with surgery.  PROCEDURE: Patient was identified in the preoperative holding area and the left hip was marked by myself.  The consent was signed by myself and the patient.  This identified the left lower extremity as the appropriate operative extremity and the surgeries to be performed was operative treatment of the hip fracture. They  were taken to the operating room where general endotracheal anesthesia was induced without difficulty and then was moved supine on a Hana table.  The operative leg was placed into the traction boot.  The non-operative leg was well-padded and affixed to the right leg holder with coban.   The arm was crossed over the chest and well padded. 2 g of Ancef was given.  Surgical timeout was performed.    Using fluoroscopy appropriate AP and lateral x-rays of the hip were obtained.  Then using traction through the Hana table was able to reduce the fracture fragments in a closed fashion.  Acceptable reduction was confirmed on AP and lateral x-rays.  Then the left hip was prepped and draped in usual sterile fashion.  A shower curtain drape was placed.  We began the surgery by placing the guidepin percutaneously at the appropriate starting point at the tip of the greater trochanter.  The appropriate starting point was confirmed on AP and lateral radiograph.  Then a 3 cm incision was made about the site of the percutaneous placement of the pin and the opening reamer with a soft tissue guide was placed into the wound down to the tip of the greater trochanter and the bone was entered with the opening reamer down to the level of lesser trochanter.  Then the openning reamer and guide pin were removed and a 17mm nail was placed. Then we chose an 9 mm cephalo-medullary nail.  This was placed without difficulty and appropriate positioning was confirmed on fluoroscopy.  Then using the blade insertion guide a second incision was made distally down the thigh to allow for placement of the cephalo-medullary screw.  The guidepin for the blade was placed up the femoral neck in the appropriate position center center in the femoral head.  Then the reamer was used to ream for the cephalomedullary screw.  A left-sided screw was used that threads in reverse to avoid rotational deformity was used.  Then the set screw was tightened.  Then the distal interlock screw was placed without difficulty.  Final films were taken.  The wounds were irrigated with normal saline and the incisions were closed in a layered fashion using 2-0 monocryl and staples.  Mepilex dressings were used.  Patient was then transferred to the hospital bed  and awakened from anesthesia without difficulty.  Counts were correct at the end the case.  There were no complications.  POST OPERATIVE INSTRUCTIONS: WBAT operative extremity Patient will be discharged on 325 mg aspirin for DVT prophylaxis if not already on DVT prophylaxis at baseline Follow-up in 2 weeks for x-rays of the operative hip and suture removal if appropriate    BLOOD LOSS:   128ml         DRAINS: none         SPECIMEN: none       COMPLICATIONS:  * No complications entered in OR log *         Disposition: PACU - hemodynamically stable.         Condition: stable

## 2022-05-13 NOTE — H&P (Signed)
History and Physical    Shelley Flores:[id number] DOB: 04-28-1933 DOA: 05/12/2022  PCP: [name], MD   Patient coming from: ALF  Chief Complaint: Fall with left hip pain  HPI: Shelley Flores is a pleasant 89 y.o. female with medical history significant for history of CVA and stage IV non-small cell lung cancer with treatment complicated by drug-induced pneumonitis who now presents to the emergency department with left hip pain after a fall.  Patient reports that she was in her usual state when she was attempting to transfer between a walker and chair, and fell onto her left hip without hitting her head or losing consciousness.  She has been experiencing severe pain at the left hip with any movement since then and has been unable to bear weight.  She denies any recent productive cough, shortness of breath, or fever.  ED Course: Upon arrival to the ED, patient is found to be afebrile and saturating well on room air with normal respiratory rate, normal heart rate, and stable blood pressure.  EKG demonstrates sinus rhythm.  Plain radiographs demonstrate left femoral neck fracture.  Blood work notable for sodium 132, WBC 29,000, and hemoglobin 10.4.  Orthopedic surgery was consulted by the ED physician and the patient was treated with Dilaudid.  Review of Systems:  All other systems reviewed and apart from HPI, are negative.  Past Medical History:  Diagnosis Date   Bruit    Abdominal bruit - Abdominal aorta/renal duplex Doppler evaluation 12/07/03 -Mildly abnormal evaluation. 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 This is consistant w/median arcuate ligament compression syndrome. *Right & Left Kidney: Essentially equal in size, symmetrical in shape w/no significant abnormalities visualized. *Right & Left Renal Arteries: No significant abnormalities.   Cancer ([place])    Edema extremities    LE edema    H/O myocardial perfusion scan 02/20/00   To rule out  ischemia - Negative adequate Bruce protocol exercise stress test with a deconditioned exercise response and normal static myocardial perfusion images with EF calculated by QGS of 77%. Represents a low risk study.   Hyperlipidemia    Hypertension    Osteoarthritis    Osteoporosis    Pleural effusion 04/2020   Valvular heart disease    Mild valvular heart disease by Echo in 2010, all mild and symptomatic, including concentric LVH, MR, TR, and AI with pulmonary artery pressure of 32. EF was normal.    Past Surgical History:  Procedure Laterality Date   Abdominal aorta/Renal duplex Doppler Evaluation  12/07/03   For abdominal bruit. Mildly abnormal evaluation. (See bruit in medical history)   BREAST EXCISIONAL BIOPSY Left 1966   BREAST EXCISIONAL BIOPSY Left 1999   CATARACT EXTRACTION  2003   CHEST TUBE INSERTION N/A 07/04/2020   Procedure: REMOVAL PLEURAL DRAINAGE CATHETER;  Surgeon: Garner Nash, DO;  Location: [facility];  Service: Pulmonary;  Laterality: N/A;   COLONOSCOPY  10/22/2004   DEXA Bone Scan  06/23/2013   IR PERC PLEURAL DRAIN W/INDWELL CATH W/IMG GUIDE  05/10/2020   IR THORACENTESIS ASP PLEURAL SPACE W/IMG GUIDE  05/04/2020    Social History:   reports that she quit smoking about 60 years ago. Her smoking use included cigarettes. She has a 1.50 pack-year smoking history. She has never used smokeless tobacco. She reports current alcohol use of about 2.0 standard drinks of alcohol per week. She reports that she does not use drugs.  Allergies  Allergen Reactions   Irbesartan Nausea Only  Family History  Problem Relation Age of Onset   Leukemia Mother 36   Cancer Father        esophagial cancer   Heart failure Maternal Grandmother    Tuberculosis Maternal Grandfather    Heart disease Paternal Grandmother    Cancer Paternal Grandfather      Prior to Admission medications   Medication Sig Start Date End Date Taking? Authorizing Provider  acetaminophen (TYLENOL)  325 MG tablet Take 2 tablets (650 mg total) by mouth every 6 (six) hours as needed for mild pain (or Fever >/= 101). 05/11/20   Raiford Noble Latif, DO  aspirin 81 MG tablet Take 81 mg by mouth daily.    [provider]  atorvastatin (LIPITOR) 20 MG tablet Take 20 mg by mouth at bedtime.    [provider]  folic acid (FOLVITE) 1 MG tablet Take 1 tablet (1 mg total) by mouth daily. 02/13/22   Heilingoetter, Cassandra L, PA-C  magnesium hydroxide (MILK OF MAGNESIA) 400 MG/5ML suspension Take 15 mLs by mouth daily as needed for mild constipation.    [provider]  metoCLOPramide (REGLAN) 5 MG tablet Take 5 mg by mouth 2 (two) times daily. Take it Before meals    [provider]  metoprolol tartrate (LOPRESSOR) 25 MG tablet Take 12.5 mg by mouth 2 (two) times daily.    [provider]  omeprazole (PRILOSEC) 20 MG capsule Take 1 capsule (20 mg total) by mouth daily. 04/24/22   Curt Bears, MD  Povidone, PF, (IVIZIA DRY EYES) 0.5 % SOLN Place 1 drop into both eyes daily. 03/02/22   [provider]  predniSONE (DELTASONE) 10 MG tablet 5 tablet p.o. daily for 1 week followed by 4 tablets p.o. daily for 1 week followed by 3 tablets p.o. daily for 1 week followed by 2 tablets p.o. daily for 1 week followed by 1 tablet p.o. daily for 1 week. 04/24/22   Curt Bears, MD  prochlorperazine (COMPAZINE) 10 MG tablet Take 1 tablet (10 mg total) by mouth every 6 (six) hours as needed. 02/13/22   Heilingoetter, Tobe Sos, PA-C    Physical Exam: Vitals:   05/13/22 0115 05/13/22 0130 05/13/22 0145 05/13/22 0248  BP: (!) 141/72 (!) 151/119 (!) 164/90 (!) 176/92  Pulse: 62 (!) 102 (!) 101 (!) 103  Resp: 13 (!) 21 (!) 22 20  Temp:   98.1 F (36.7 C) 97.9 F (36.6 C)  TempSrc:    Oral  SpO2: 95% 97% 97% 98%  Weight:      Height:        Constitutional: NAD, calm  Eyes: PERTLA, lids and conjunctivae normal ENMT: Mucous membranes are moist.  Posterior pharynx clear of any exudate or lesions.   Neck: supple, no masses  Respiratory: no wheezing, no crackles. No accessory muscle use.  Cardiovascular: S1 & S2 heard, regular rate and rhythm. No extremity edema.  Abdomen: No distension, no tenderness, soft. Bowel sounds active.  Musculoskeletal: no clubbing / cyanosis. Left hip tenderness, neurovascularly intact distally.   Skin: no significant rashes, lesions, ulcers. Warm, dry, well-perfused. Neurologic: CN 2-12 grossly intact. Moving all extremities. Alert and oriented.  Psychiatric: Pleasant. Cooperative.    Labs and Imaging on Admission: I have personally reviewed following labs and imaging studies  CBC: Recent Labs  Lab 05/12/22 2253 05/12/22 2302 05/13/22 0338  WBC 29.0*  --  24.3*  NEUTROABS 27.5*  --   --   HGB 10.4* 10.2* 11.1*  HCT 31.4* 30.0*  35.0*  MCV 97.2  --  100.9*  PLT 154  --  751*   Basic Metabolic Panel: Recent Labs  Lab 05/12/22 2253 05/12/22 2302  NA 132* 133*  K 3.9 3.9  CL 102 102  CO2 22  --   GLUCOSE 154* 155*  BUN 27* 25*  CREATININE 0.76 0.80  CALCIUM 9.3  --    GFR: Estimated Creatinine Clearance: 35.5 mL/min (by C-G formula based on SCr of 0.8 mg/dL). Liver Function Tests: No results for input(s): "AST", "ALT", "ALKPHOS", "BILITOT", "PROT", "ALBUMIN" in the last 168 hours. No results for input(s): "LIPASE", "AMYLASE" in the last 168 hours. No results for input(s): "AMMONIA" in the last 168 hours. Coagulation Profile: No results for input(s): "INR", "PROTIME" in the last 168 hours. Cardiac Enzymes: No results for input(s): "CKTOTAL", "CKMB", "CKMBINDEX", "TROPONINI" in the last 168 hours. BNP (last 3 results) No results for input(s): "PROBNP" in the last 8760 hours. HbA1C: No results for input(s): "HGBA1C" in the last 72 hours. CBG: No results for input(s): "GLUCAP" in the last 168 hours. Lipid Profile: No results for input(s): "CHOL", "HDL", "LDLCALC", "TRIG", "CHOLHDL",  "LDLDIRECT" in the last 72 hours. Thyroid Function Tests: No results for input(s): "TSH", "T4TOTAL", "FREET4", "T3FREE", "THYROIDAB" in the last 72 hours. Anemia Panel: No results for input(s): "VITAMINB12", "FOLATE", "FERRITIN", "TIBC", "IRON", "RETICCTPCT" in the last 72 hours. Urine analysis:    Component Value Date/Time   COLORURINE YELLOW 02/26/2022 [place] 02/26/2022 1636   LABSPEC 1.010 02/26/2022 1636   PHURINE 6.0 02/26/2022 1636   GLUCOSEU NEGATIVE 02/26/2022 1636   HGBUR SMALL (A) 02/26/2022 1636   BILIRUBINUR NEGATIVE 02/26/2022 1636   KETONESUR NEGATIVE 02/26/2022 1636   PROTEINUR 30 (A) 04/24/2022 1018   NITRITE NEGATIVE 02/26/2022 1636   LEUKOCYTESUR TRACE (A) 02/26/2022 1636   Sepsis Labs: @LABRCNTIP (procalcitonin:4,lacticidven:4) )No results found for this or any previous visit (from the past 240 hour(s)).   Radiological Exams on Admission: CT Hip Left Wo Contrast  Result Date: 05/12/2022 CLINICAL DATA:  Hip trauma, left femoral neck fracture.  Fall. EXAM: CT OF THE LEFT HIP WITHOUT CONTRAST TECHNIQUE: Multidetector CT imaging of the left hip was performed according to the standard protocol. Multiplanar CT image reconstructions were also generated. RADIATION DOSE REDUCTION: This exam was performed according to the departmental dose-optimization program which includes automated exposure control, adjustment of the mA and/or kV according to patient size and/or use of iterative reconstruction technique. COMPARISON:  Radiograph performed earlier on the same date. FINDINGS: Bones/Joint/Cartilage There is mildly comminuted intertrochanteric fracture of the left femur with superolateral displacement of the distal femur. The femoral head is located within the acetabulum. There is no femoral head dislocation. No appreciable pelvic fracture. Appreciable joint effusion. Ligaments Suboptimally assessed by CT. Muscles and Tendons Small hematoma about the fracture  site. Generalized muscle atrophy. No evidence of tendon tear. Soft tissues Mild subcutaneous soft tissue edema about the lateral aspect of the hip. No fluid collection or hematoma. IMPRESSION: Mildly comminuted intertrochanteric fracture of the left femur with superolateral displacement of the distal femur. No femoral head dislocation. Electronically Signed   By: Keane Police D.O.   On: 05/12/2022 23:48   DG Hip Unilat With Pelvis 2-3 Views Left  Result Date: 05/12/2022 CLINICAL DATA:  Fall EXAM: DG HIP (WITH OR WITHOUT PELVIS) 3V LEFT COMPARISON:  None Available. FINDINGS: Intertrochanteric fracture left femoral neck with varus angulation. Evaluation of the pelvis is very limited due to overlying bowel gas and  stool. IMPRESSION: Left femoral neck fracture. Electronically Signed   By: Sammie Bench M.D.   On: 05/12/2022 22:22   DG Chest 2 View  Result Date: 05/12/2022 CLINICAL DATA:  Fractured hip EXAM: CHEST - 2 VIEW COMPARISON:  03/01/2022 FINDINGS: Evaluation of the right hemithorax is nondiagnostic due to over penetration. There is evidence of alveolar consolidation left mid to lower lung. There may be left-sided pleural effusion. No gross pneumothorax no definite pneumothorax identified. Cardiac silhouette appears within normal limits. Aorta is tortuous. IMPRESSION: 1. There is suggestion of left basilar consolidation, likely pneumonia. 2. Repeat examination should be considered due to technical limitations. Electronically Signed   By: Sammie Bench M.D.   On: 05/12/2022 22:20    EKG: Independently reviewed. Sinus rhythm.   Assessment/Plan   1. Left hip fracture  - Appreciate orthopedic surgery consultation  - She denies hx of heart disease and RCRI is 1; no further preoperative cardiac evaluation indicated  - Keep NPO, hold ASA, continue pain-control and supportive care  2. Lung cancer; drug-induced pneumonitis  - Stage IV non-small cell lung cancer under the care of Dr. Julien Nordmann  -  [name] on hold and 4-5 week prednisone taper started 04/24/22 for drug-induced pneumonitis  - Continue prednisone    3. Hx of CVA  - Hx of cerebellar CVA in October 2023  - Hold ASA preoperatively, continue Lipitor   4. Leukocytosis  - WBC is 29,000 on admission in setting of steroid use  - No fever or apparent infection, will monitor    DVT prophylaxis: SCDs  Code Status: DNR Level of Care: Level of care: Med-Surg Family Communication: None present  Disposition Plan:  Patient is from: ALF   Anticipated d/c is to: SNF  Anticipated d/c date is: 05/17/22 Patient currently: Pending orthopedic surgery consultation and likely operative hip repair  Consults called: ortho  Admission status: Inpatient     Vianne Bulls, MD [facility]  05/13/2022, 4:15 AM

## 2022-05-13 NOTE — Transfer of Care (Signed)
Immediate Anesthesia Transfer of Care Note  Patient: Shelley Flores  Procedure(s) Performed: INTRAMEDULLARY (IM) NAIL INTERTROCHANTERIC (Left: Hip)  Patient Location: PACU  Anesthesia Type:General  Level of Consciousness: awake, alert , oriented, and patient cooperative  Airway & Oxygen Therapy: Patient Spontanous Breathing and Patient connected to face mask oxygen  Post-op Assessment: Report given to RN and Post -op Vital signs reviewed and stable  Post vital signs: Reviewed and stable  Last Vitals:  Vitals Value Taken Time  BP 135/75 05/13/22 1231  Temp    Pulse 74 05/13/22 1233  Resp 13 05/13/22 1233  SpO2 100 % 05/13/22 1233  Vitals shown include unvalidated device data.  Last Pain:  Vitals:   05/13/22 1056  TempSrc:   PainSc: 0-No pain      Patients Stated Pain Goal: 0 (79/98/72 1587)  Complications: No notable events documented.

## 2022-05-13 NOTE — Anesthesia Procedure Notes (Signed)
Procedure Name: LMA Insertion Date/Time: 05/13/2022 11:33 AM  Performed by: Cleda Daub, CRNAPre-anesthesia Checklist: Patient identified, Emergency Drugs available, Suction available and Patient being monitored Patient Re-evaluated:Patient Re-evaluated prior to induction Oxygen Delivery Method: Circle system utilized Preoxygenation: Pre-oxygenation with 100% oxygen Induction Type: IV induction LMA: LMA with gastric port inserted LMA Size: 3.0 Number of attempts: 1 Placement Confirmation: positive ETCO2 and breath sounds checked- equal and bilateral Tube secured with: Tape Dental Injury: Teeth and Oropharynx as per pre-operative assessment

## 2022-05-13 NOTE — Anesthesia Preprocedure Evaluation (Signed)
Anesthesia Evaluation  Patient identified by MRN, date of birth, ID band Patient awake    Reviewed: Allergy & Precautions, H&P , NPO status , Patient's Chart, lab work & pertinent test results  Airway Mallampati: II  TM Distance: >3 FB Neck ROM: Full    Dental no notable dental hx.    Pulmonary COPD, former smoker Stage 4 lung cancer   Pulmonary exam normal breath sounds clear to auscultation       Cardiovascular hypertension, + Valvular Problems/Murmurs MR  Rhythm:Regular Rate:Normal + Systolic murmurs    Neuro/Psych CVA  negative psych ROS   GI/Hepatic negative GI ROS, Neg liver ROS,,,  Endo/Other  negative endocrine ROS    Renal/GU negative Renal ROS  negative genitourinary   Musculoskeletal negative musculoskeletal ROS (+)    Abdominal   Peds negative pediatric ROS (+)  Hematology  (+) Blood dyscrasia, anemia   Anesthesia Other Findings   Reproductive/Obstetrics negative OB ROS                             Anesthesia Physical Anesthesia Plan  ASA: 4  Anesthesia Plan: General   Post-op Pain Management: Ofirmev IV (intra-op)*   Induction: Intravenous  PONV Risk Score and Plan: 3 and Ondansetron, Dexamethasone and Treatment may vary due to age or medical condition  Airway Management Planned: LMA  Additional Equipment:   Intra-op Plan:   Post-operative Plan: Extubation in OR  Informed Consent: I have reviewed the patients History and Physical, chart, labs and discussed the procedure including the risks, benefits and alternatives for the proposed anesthesia with the patient or authorized representative who has indicated his/her understanding and acceptance.     Dental advisory given  Plan Discussed with: CRNA and Surgeon  Anesthesia Plan Comments:        Anesthesia Quick Evaluation

## 2022-05-14 ENCOUNTER — Encounter (HOSPITAL_COMMUNITY): Payer: Self-pay | Admitting: Orthopaedic Surgery

## 2022-05-14 DIAGNOSIS — T50905A Adverse effect of unspecified drugs, medicaments and biological substances, initial encounter: Secondary | ICD-10-CM | POA: Diagnosis not present

## 2022-05-14 DIAGNOSIS — J984 Other disorders of lung: Secondary | ICD-10-CM | POA: Diagnosis not present

## 2022-05-14 DIAGNOSIS — S72002A Fracture of unspecified part of neck of left femur, initial encounter for closed fracture: Secondary | ICD-10-CM | POA: Diagnosis not present

## 2022-05-14 DIAGNOSIS — I10 Essential (primary) hypertension: Secondary | ICD-10-CM | POA: Diagnosis not present

## 2022-05-14 LAB — CBC
HCT: 28.9 % — ABNORMAL LOW (ref 36.0–46.0)
Hemoglobin: 9.3 g/dL — ABNORMAL LOW (ref 12.0–15.0)
MCH: 32.5 pg (ref 26.0–34.0)
MCHC: 32.2 g/dL (ref 30.0–36.0)
MCV: 101 fL — ABNORMAL HIGH (ref 80.0–100.0)
Platelets: 108 10*3/uL — ABNORMAL LOW (ref 150–400)
RBC: 2.86 MIL/uL — ABNORMAL LOW (ref 3.87–5.11)
RDW: 20.9 % — ABNORMAL HIGH (ref 11.5–15.5)
WBC: 25.3 10*3/uL — ABNORMAL HIGH (ref 4.0–10.5)
nRBC: 0 % (ref 0.0–0.2)

## 2022-05-14 LAB — BASIC METABOLIC PANEL
Anion gap: 10 (ref 5–15)
BUN: 27 mg/dL — ABNORMAL HIGH (ref 8–23)
CO2: 21 mmol/L — ABNORMAL LOW (ref 22–32)
Calcium: 9.1 mg/dL (ref 8.9–10.3)
Chloride: 102 mmol/L (ref 98–111)
Creatinine, Ser: 0.85 mg/dL (ref 0.44–1.00)
GFR, Estimated: >60 mL/min (ref >60)
Glucose, Bld: 111 mg/dL — ABNORMAL HIGH (ref 70–99)
Potassium: 4.5 mmol/L (ref 3.5–5.1)
Sodium: 133 mmol/L — ABNORMAL LOW (ref 135–145)

## 2022-05-14 MED ORDER — ENSURE ENLIVE PO LIQD
237.0000 mL | Freq: Two times a day (BID) | ORAL | Status: DC
  Administered 2022-05-14 – 2022-05-20 (×6): 237 mL via ORAL

## 2022-05-14 MED ORDER — ADULT MULTIVITAMIN W/MINERALS CH
1.0000 | ORAL_TABLET | Freq: Every day | ORAL | Status: DC
  Administered 2022-05-15 – 2022-05-20 (×5): 1 via ORAL
  Filled 2022-05-14 (×6): qty 1

## 2022-05-14 MED ORDER — SODIUM CHLORIDE 0.9 % IV BOLUS
1000.0000 mL | Freq: Once | INTRAVENOUS | Status: AC
  Administered 2022-05-14: 1000 mL via INTRAVENOUS

## 2022-05-14 MED ORDER — POLYETHYLENE GLYCOL 3350 17 G PO PACK
17.0000 g | PACK | Freq: Two times a day (BID) | ORAL | Status: AC
  Administered 2022-05-14 – 2022-05-15 (×4): 17 g via ORAL
  Filled 2022-05-14 (×4): qty 1

## 2022-05-14 MED ORDER — METOPROLOL TARTRATE 5 MG/5ML IV SOLN
5.0000 mg | Freq: Four times a day (QID) | INTRAVENOUS | Status: DC | PRN
  Administered 2022-05-14: 5 mg via INTRAVENOUS
  Filled 2022-05-14 (×2): qty 5

## 2022-05-14 MED ORDER — METOPROLOL TARTRATE 12.5 MG HALF TABLET
12.5000 mg | ORAL_TABLET | Freq: Two times a day (BID) | ORAL | Status: DC
  Administered 2022-05-14 – 2022-05-20 (×11): 12.5 mg via ORAL
  Filled 2022-05-14 (×12): qty 1

## 2022-05-14 MED ORDER — ASPIRIN 81 MG PO TBEC
81.0000 mg | DELAYED_RELEASE_TABLET | Freq: Every day | ORAL | Status: DC
  Administered 2022-05-14 – 2022-05-20 (×6): 81 mg via ORAL
  Filled 2022-05-14 (×6): qty 1

## 2022-05-14 MED ORDER — LIDOCAINE 5 % EX PTCH
1.0000 | MEDICATED_PATCH | CUTANEOUS | Status: DC
  Administered 2022-05-14 – 2022-05-19 (×5): 1 via TRANSDERMAL
  Filled 2022-05-14 (×7): qty 1

## 2022-05-14 NOTE — Progress Notes (Signed)
Patient recently in yellow MEWS. However, bedside RN had concerns about OT stating that patient could not sit up on the side of the bed while checking blood pressure and patient had some confusion. Immediately after receiving update from OT, bedside RN went to assess patient. Patient alert and oriented x4. Full set of vital signs obtained at rest and patient was in yellow MEWS. Bedside RN notified Dr. Charlynne Cousins, MD to inform about patient's rapid heart rate and requested IV bolus. New orders from Dr. Charlynne Cousins, MD:  Sodium chloride 0.9% 1,000 ml bolus at 250 ml/hr, oral Metoprolol 12.5 mg 2x daily, IV Metoprolol Q 6 hr PRN for HR > 100, and orthostatic vital signs. Bedside RN flushed patient's IV and it was occluded.  Two IV sticks were attempted before IV team was consulted STAT.  IV team obtained new IV and had to look for new site via ultrasound.  Amy Abernathy, charge RN made aware of patient's yellow MEWS. Edwyna Perfect, Rapid Response RN was notified to come assess patient and monitor while patient was waiting on new IV.  Oral Metoprolol, IV bolus, and IV Metoprolol given immediately after IV team found another IV.     05/14/22 1022  Assess: MEWS Score  Temp 99 F (37.2 C)  BP 113/64  MAP (mmHg) 78  Pulse Rate (!) 119  Resp (!) 22  Level of Consciousness Alert  SpO2 96 %  O2 Device Room Air  Assess: MEWS Score  MEWS Temp 0  MEWS Systolic 0  MEWS Pulse 2  MEWS RR 1  MEWS LOC 0  MEWS Score 3  MEWS Score Color Yellow  Assess: if the MEWS score is Yellow or Red  Were vital signs taken at a resting state? Yes  Focused Assessment Change from prior assessment (see assessment flowsheet)  Does the patient meet 2 or more of the SIRS criteria? Yes  Does the patient have a confirmed or suspected source of infection? No  MEWS guidelines implemented *See Row Information* Yes  Treat  MEWS Interventions Other (Comment);Administered prn meds/treatments (Notified Dr.  Charlynne Cousins, MD at 10:16 AM to request IV bolus. Gave oral Metoprolol at 10:32 AM, Normal saline 1,000 ml bolus (250 ml/hr) at 11:57 AM and IV Metoprolol at 11:57 AM (after IV team came to start another IV STAT).)  Take Vital Signs  Increase Vital Sign Frequency  Yellow: Q 2hr X 2 then Q 4hr X 2, if remains yellow, continue Q 4hrs  Escalate  MEWS: Escalate Yellow: discuss with charge nurse/RN and consider discussing with provider and RRT  Notify: Charge Nurse/RN  Name of Charge Nurse/RN Notified Amy Valetta Fuller, RN  Date Charge Nurse/RN Notified 05/14/22  Time Charge Nurse/RN Notified 1019  Provider Notification  Provider Name/Title Dr. Charlynne Cousins, MD  Date Provider Notified 05/14/22  Time Provider Notified 1014  Method of Notification Page  Notification Reason Change in status (To inform about patient feeliing dizzy when working with OT and not able to sit up when OT was attempting to get a BP.)  Provider response See new orders (Sodium Chloride 0.9& bolus 1,000 ml at 250 ml/hr, oral Metoprolol 12.5 mg 2x daily, and IV Metoprolol 5 mg Q6 hr PRN for HR > 100)  Date of Provider Response 05/14/22  Time of Provider Response 1016  Notify: Rapid Response  Name of Rapid Response RN Notified Edwyna Perfect, RN  Date Rapid Response Notified 05/14/22  Time Rapid Response Notified 1101 (Rapid response was  notified to come assess and monitor patient after two IV sticks were attempted (after previous IV occluded) while pt was waiting on IV team to get another IV in STAT.)  Assess: SIRS CRITERIA  SIRS Temperature  0  SIRS Pulse 1  SIRS Respirations  1  SIRS WBC 1  SIRS Score Sum  3

## 2022-05-14 NOTE — Plan of Care (Signed)
  Problem: Education: Goal: Knowledge of General Education information will improve Description Including pain rating scale, medication(s)/side effects and non-pharmacologic comfort measures Outcome: Progressing   

## 2022-05-14 NOTE — Progress Notes (Signed)
[facility] PROGRESS NOTE    Progress Note  Shelley Flores  ZOX:[id number] DOB: [birth year] DOA: 05/12/2022 PCP: [name], MD     Brief Narrative:   Shelley Flores is an 89 y.o. female past medical history significant for CVA, non-small cell stage IV lung cancer with complicated treatment drug-induced pneumonitis, presents to the ED with left hip pain after mechanical fall x-ray showed a left femoral fracture   Assessment/Plan:   Closed left hip fracture, initial encounter [facility]): Orthopedic surgery was consulted she status post cephalomedullary nailing of the left hip fracture. Currently on Lovenox for DVT prophylaxis. Continue narcotics, will start her on a bowel regimen. Weightbearing and anticoagulation per orthopedic surgery. Hemoglobin is drifting down continue to monitor closely. PT/OT has been consulted.  Drug-induced pneumonitis: Tagrisso on hold on 4 weeks of steroid taper started on 04/24/2022 for drug-induced pneumonitis. Continue prednisone.  History of CVA: Resume aspirin continue statins.  Leukocytosis: In the setting of steroids and Closed left hip fracture.  Macrocytic anemia: B12 600 folate unremarkable..   DVT prophylaxis: lovenox Family Communication:non Status is: Inpatient Remains inpatient appropriate because: Acute left hip fracture    Code Status:     Code Status Orders  (From admission, onward)           Start     Ordered   05/13/22 0211  Do not attempt resuscitation (DNR)  Continuous       Question Answer Comment  If patient has no pulse and is not breathing Do Not Attempt Resuscitation   If patient has a pulse and/or is breathing: Medical Treatment Goals LIMITED ADDITIONAL INTERVENTIONS: Use medication/IV fluids and cardiac monitoring as indicated; Do not use intubation or mechanical ventilation (DNI), also provide comfort medications.  Transfer to Progressive/Stepdown as indicated, avoid Intensive Care.    Consent: Discussion documented in EHR or advanced directives reviewed      05/13/22 0212           Code Status History     Date Active Date Inactive Code Status Order ID Comments User Context   02/26/2022 1659 03/02/2022 2343 DNR [id number]  Eugenie Filler, MD ED   05/02/2020 0015 05/12/2020 1620 DNR [id number]  [name], MD ED   05/01/2020 2354 05/02/2020 0015 Full Code [id number]  [name], MD ED      Advance Directive Documentation    Flowsheet Row Most Recent Value  Type of Advance Directive Healthcare Power of [place], Living will, Out of facility DNR (pink MOST or yellow form)  Pre-existing out of facility DNR order (yellow form or pink MOST form) --  "MOST" Form in Place? --         IV Access:   Peripheral IV   Procedures and diagnostic studies:   DG HIP UNILAT WITH PELVIS 2-3 VIEWS LEFT  Result Date: 05/13/2022 CLINICAL DATA:  Femur fracture.  Left hip IM nail. EXAM: DG HIP (WITH OR WITHOUT PELVIS) 2-3V LEFT COMPARISON:  Preoperative imaging FINDINGS: Four fluoroscopic spot views of the left hip obtained in the operating room. Intramedullary nail with trans trochanteric and distal locking screw fixation traverse intertrochanteric femur fracture. Fluoroscopy time 33 seconds. Dose 3.72 mGy. IMPRESSION: Intraoperative fluoroscopy during ORIF of left intertrochanteric femur fracture. Electronically Signed   By: Keith Rake M.D.   On: 05/13/2022 12:22   DG C-Arm 1-60 Min-No Report  Result Date: 05/13/2022 Fluoroscopy was utilized by the requesting physician.  No radiographic interpretation.   CT Hip Left Wo Contrast  Result Date: 05/12/2022 CLINICAL DATA:  Hip trauma, left femoral neck fracture.  Fall. EXAM: CT OF THE LEFT HIP WITHOUT CONTRAST TECHNIQUE: Multidetector CT imaging of the left hip was performed according to the standard protocol. Multiplanar CT image reconstructions were also generated. RADIATION DOSE REDUCTION: This exam was  performed according to the departmental dose-optimization program which includes automated exposure control, adjustment of the mA and/or kV according to patient size and/or use of iterative reconstruction technique. COMPARISON:  Radiograph performed earlier on the same date. FINDINGS: Bones/Joint/Cartilage There is mildly comminuted intertrochanteric fracture of the left femur with superolateral displacement of the distal femur. The femoral head is located within the acetabulum. There is no femoral head dislocation. No appreciable pelvic fracture. Appreciable joint effusion. Ligaments Suboptimally assessed by CT. Muscles and Tendons Small hematoma about the fracture site. Generalized muscle atrophy. No evidence of tendon tear. Soft tissues Mild subcutaneous soft tissue edema about the lateral aspect of the hip. No fluid collection or hematoma. IMPRESSION: Mildly comminuted intertrochanteric fracture of the left femur with superolateral displacement of the distal femur. No femoral head dislocation. Electronically Signed   By: Keane Police D.O.   On: 05/12/2022 23:48   DG Hip Unilat With Pelvis 2-3 Views Left  Result Date: 05/12/2022 CLINICAL DATA:  Fall EXAM: DG HIP (WITH OR WITHOUT PELVIS) 3V LEFT COMPARISON:  None Available. FINDINGS: Intertrochanteric fracture left femoral neck with varus angulation. Evaluation of the pelvis is very limited due to overlying bowel gas and stool. IMPRESSION: Left femoral neck fracture. Electronically Signed   By: Sammie Bench M.D.   On: 05/12/2022 22:22   DG Chest 2 View  Result Date: 05/12/2022 CLINICAL DATA:  Fractured hip EXAM: CHEST - 2 VIEW COMPARISON:  03/01/2022 FINDINGS: Evaluation of the right hemithorax is nondiagnostic due to over penetration. There is evidence of alveolar consolidation left mid to lower lung. There may be left-sided pleural effusion. No gross pneumothorax no definite pneumothorax identified. Cardiac silhouette appears within normal limits.  Aorta is tortuous. IMPRESSION: 1. There is suggestion of left basilar consolidation, likely pneumonia. 2. Repeat examination should be considered due to technical limitations. Electronically Signed   By: Sammie Bench M.D.   On: 05/12/2022 22:20     Medical Consultants:   None.   Subjective:    ARWILDA [place] relates pain is not controlled, has not had a bowel movement.  Objective:    Vitals:   05/13/22 2135 05/14/22 0149 05/14/22 0454 05/14/22 0500  BP:  132/77 (!) 141/73   Pulse: 96 87 (!) 114 92  Resp: 16 15 15 18   Temp:  97.7 F (36.5 C) 97.6 F (36.4 C)   TempSrc:  Oral Oral   SpO2: 97% 100% 100% 99%  Weight:      Height:       SpO2: 99 % O2 Flow Rate (L/min): 2 L/min   Intake/Output Summary (Last 24 hours) at 05/14/2022 0842 Last data filed at 05/14/2022 0600 Gross per 24 hour  Intake 1075.37 ml  Output 550 ml  Net 525.37 ml    Filed Weights   05/12/22 2154 05/13/22 0248 05/13/22 1056  Weight: 45.4 kg 41.5 kg 41.5 kg    Exam: General exam: In no acute distress. Respiratory system: Good air movement and clear to auscultation. Cardiovascular system: S1 & S2 heard, RRR. No JVD. Gastrointestinal system: Abdomen is nondistended, soft and nontender.  Extremities: No pedal edema. Skin: No rashes, lesions or ulcers Psychiatry: Judgement and insight appear normal. Mood &  affect appropriate.  Data Reviewed:    Labs: Basic Metabolic Panel: Recent Labs  Lab 05/12/22 2253 05/12/22 2302 05/13/22 0338 05/13/22 1349 05/14/22 0330  NA 132* 133* 133*  --  133*  K 3.9 3.9 3.9  --  4.5  CL 102 102 102  --  102  CO2 22  --  23  --  21*  GLUCOSE 154* 155* 143*  --  111*  BUN 27* 25* 23  --  27*  CREATININE 0.76 0.80 0.73 0.42* 0.85  CALCIUM 9.3  --  9.3  --  9.1    GFR Estimated Creatinine Clearance: 30.5 mL/min (by C-G formula based on SCr of 0.85 mg/dL). Liver Function Tests: No results for input(s): "AST", "ALT", "ALKPHOS", "BILITOT", "PROT",  "ALBUMIN" in the last 168 hours. No results for input(s): "LIPASE", "AMYLASE" in the last 168 hours. No results for input(s): "AMMONIA" in the last 168 hours. Coagulation profile No results for input(s): "INR", "PROTIME" in the last 168 hours. COVID-19 Labs  No results for input(s): "DDIMER", "FERRITIN", "LDH", "CRP" in the last 72 hours.  Lab Results  Component Value Date   SARSCOV2NAA NEGATIVE 02/26/2022   [place] NEGATIVE 07/03/2020   [place] NEGATIVE 05/11/2020   [place] NEGATIVE 05/01/2020    CBC: Recent Labs  Lab 05/12/22 2253 05/12/22 2302 05/13/22 0338 05/13/22 1349 05/14/22 0330  WBC 29.0*  --  24.3* 23.1* 25.3*  NEUTROABS 27.5*  --   --   --   --   HGB 10.4* 10.2* 11.1* 8.4* 9.3*  HCT 31.4* 30.0* 35.0* 25.5* 28.9*  MCV 97.2  --  100.9* 99.2 101.0*  PLT 154  --  111* 71* 108*    Cardiac Enzymes: No results for input(s): "CKTOTAL", "CKMB", "CKMBINDEX", "TROPONINI" in the last 168 hours. BNP (last 3 results) No results for input(s): "PROBNP" in the last 8760 hours. CBG: No results for input(s): "GLUCAP" in the last 168 hours. D-Dimer: No results for input(s): "DDIMER" in the last 72 hours. Hgb A1c: No results for input(s): "HGBA1C" in the last 72 hours. Lipid Profile: No results for input(s): "CHOL", "HDL", "LDLCALC", "TRIG", "CHOLHDL", "LDLDIRECT" in the last 72 hours. Thyroid function studies: No results for input(s): "TSH", "T4TOTAL", "T3FREE", "THYROIDAB" in the last 72 hours.  Invalid input(s): "FREET3" Anemia work up: Recent Labs    05/13/22 [place] 689   Sepsis Labs: Recent Labs  Lab 05/12/22 2253 05/13/22 0338 05/13/22 1349 05/14/22 0330  WBC 29.0* 24.3* 23.1* 25.3*    Microbiology Recent Results (from the past 240 hour(s))  Surgical PCR screen     Status: Abnormal   Collection Time: 05/13/22  4:19 AM   Specimen: Nasal Mucosa; Nasal Swab  Result Value Ref Range Status   MRSA, PCR POSITIVE (A) NEGATIVE Final     Comment: RESULT CALLED TO, READ BACK BY AND VERIFIED WITH: ANGELA RN ON 05/13/22 AT 1040 BY GOLSONM    Staphylococcus aureus POSITIVE (A) NEGATIVE Final    Comment: (NOTE) The Xpert SA Assay (FDA approved for NASAL specimens in patients 65 years of age and older), is one component of a comprehensive surveillance program. It is not intended to diagnose infection nor to guide or monitor treatment. Performed at [facility], [place] [address], [place], [place] [address]      Medications:    atorvastatin  20 mg Oral QHS   docusate sodium  100 mg Oral BID   enoxaparin (LOVENOX) injection  30 mg Subcutaneous Q24H   mupirocin  ointment  1 Application Nasal BID   predniSONE  30 mg Oral Q breakfast   Continuous Infusions:  methocarbamol (ROBAXIN) IV        LOS: 2 days   Charlynne Cousins  [facility]  05/14/2022, 8:42 AM

## 2022-05-14 NOTE — Progress Notes (Signed)
     Shelley Flores is a 89 y.o. female   Orthopaedic diagnosis: Left intertrochanteric hip fracture  Surgery: Cephalomedullary nail of the left hip trochanteric fracture 05/13/2022  Subjective: Patient appears comfortable in bed.  She reports minimal discomfort in the left hip. She does complain of pain in the low back.  She does states she had some low back pain preceding her injury and was working on this with therapy at her long-term care facility prior to injury.  Denies any radiating pain, numbness, or tingling down the lower extremities.  Reports pain reasonably controlled on current regimen.  Has not been out of bed yet but therapy is in the room presently.  Tolerating p.o. well.  No bowel movement yet.  Patient has no family in the area.  She resides permanently in an assisted living facility.  Objectyive: Vitals:   05/14/22 0454 05/14/22 0500  BP: (!) 141/73   Pulse: (!) 114 92  Resp: 15 18  Temp: 97.6 F (36.4 C)   SpO2: 100% 99%     Exam: Awake and alert Respirations even and unlabored No acute distress  Examination of left hip shows clean, dry, intact dressing.  The dressing was not removed.  Swelling appropriate.  No gross deformity.  She actively gently flexes the knee throughout the encounter.  She has good range of motion and strength at the ankles.  Did not assessed for range of motion at the hip.  She has intact sensation to light touch throughout the bilateral lower extremities.  Calves are soft and nontender.  Exposed skin is benign.  Warm and well-perfused distally.  Assessment: Postop day 1 status post the above, doing well  Plan: Patient doing well.  She will work with therapy today and we will see how she does.  Expect she will benefit from disposition to skilled nursing facility but will await their recommendations.  We did discuss her low back pain.  She does report a history of low back pain at baseline which may have been aggravated by her injury and  surgery.  Will see how she responds to therapy and reevaluate her tomorrow.  -Weightbearing as tolerated left lower extremity with walker -Continue PT/OT while in house -Keep dressing clean, dry, and intact.  Reinforce as needed -30 mg Lovenox daily for DVT prophylaxis while inpatient.  Plan for full-strength aspirin daily x 30 days for DVT prophylaxis outpatient -Pain control as needed  Plan for follow-up outpatient with Dr. Lucia Gaskins 2 weeks from surgery for suture removal if appropriate and x-rays of the left hip.   Cebert Dettmann J. [place], PA-C

## 2022-05-14 NOTE — Evaluation (Addendum)
Occupational Therapy Evaluation Patient Details Name: Shelley Flores MRN: [id number] DOB: 07-06-1932 Today's Date: 05/14/2022   History of Present Illness Patient is a 89 year old female who presented to the hosptial after a fall that resulted in L hip pain. Patient was found to have a left femoral neck fracture. Patient underwent IM nail surgery on 1/1. PMH: chemotherapy, stage IV lung cancer, small embolic cerebellar infarct.   Clinical Impression   Patient is a 89 year old female who was admitted for above. Patient was living at [facility] with plans to transition to ALF prior to fall. Patient was unable to tolerate sitting EOB with increased dizziness with attempts to transition to EOB. Patient was noted to have decreased functional activity tolernace, decreased BUE strength, decreased endurance, decreased sitting balance, decreased standing balanced, decreased safety awareness, and decreased knowledge of AE/AD impacting participation in ADLs. Patient would continue to benefit from skilled OT services at this time while admitted and after d/c to address noted deficits in order to improve overall safety and independence in ADLs.  Patients blood pressure sitting in bed was 122/59 mmhg (MAP 76) with HR of 103 bpm after first attempt to sit EOB. Patient unable to tolerate sitting EOB to get BP. When transitioned back to bed blood pressure was 124/56 mmhg with HR of 105 bpm.       Recommendations for follow up therapy are one component of a multi-disciplinary discharge planning process, led by the attending physician.  Recommendations may be updated based on patient status, additional functional criteria and insurance authorization.   Follow Up Recommendations  Skilled nursing-short term rehab (<3 hours/day)     Assistance Recommended at Discharge Frequent or constant Supervision/Assistance  Patient can return home with the following Two people to help with walking and/or transfers;Two people to help  with bathing/dressing/bathroom;Direct supervision/assist for financial management;Help with stairs or ramp for entrance;Assistance with cooking/housework;Assist for transportation;Direct supervision/assist for medications management    Functional Status Assessment  Patient has had a recent decline in their functional status and demonstrates the ability to make significant improvements in function in a reasonable and predictable amount of time.  Equipment Recommendations  Other (comment) (defer to next venue)       Precautions / Restrictions Precautions Precautions: Fall Restrictions Weight Bearing Restrictions: No LLE Weight Bearing: Weight bearing as tolerated      Mobility Bed Mobility Overal bed mobility: Needs Assistance Bed Mobility: Supine to Sit     Supine to sit: Total assist     General bed mobility comments: with increaed time and dizziness impacting participation          ADL either performed or assessed with clinical judgement   ADL Overall ADL's : Needs assistance/impaired Eating/Feeding: Set up;Bed level   Grooming: Bed level;Minimal assistance   Upper Body Bathing: Minimal assistance;Bed level   Lower Body Bathing: Bed level;Total assistance   Upper Body Dressing : Minimal assistance;Bed level   Lower Body Dressing: Total assistance;Bed level     Toilet Transfer Details (indicate cue type and reason): unable to tolerate with increased dizziness with sitting EOB attempts. Toileting- Clothing Manipulation and Hygiene: Total assistance;Bed level               Vision Baseline Vision/History: 1 Wears glasses              Pertinent Vitals/Pain Pain Assessment Pain Assessment: Faces Faces Pain Scale: Hurts even more Pain Location: L hip and back Pain Descriptors / Indicators: Discomfort, Contraction Pain  Intervention(s): Limited activity within patient's tolerance, Monitored during session, Premedicated before session, Repositioned      Hand Dominance Right   Extremity/Trunk Assessment Upper Extremity Assessment Upper Extremity Assessment: Overall WFL for tasks assessed   Lower Extremity Assessment Lower Extremity Assessment: Defer to PT evaluation   Cervical / Trunk Assessment Cervical / Trunk Assessment: Kyphotic   Communication Communication Communication: No difficulties   Cognition Arousal/Alertness: Awake/alert Behavior During Therapy: WFL for tasks assessed/performed Overall Cognitive Status: Within Functional Limits for tasks assessed             General Comments  patients blood pressure was 122/59 mmhg HR 130 bpm.            Home Living Family/patient expects to be discharged to:: Unsure           Additional Comments: patient reported [facility]. patient reported she was at [facility] this past month.      Prior Functioning/Environment Prior Level of Function : Independent/Modified Independent             Mobility Comments: uses [employer] ADLs Comments: IADL's        OT Problem List: Decreased activity tolerance;Impaired balance (sitting and/or standing);Decreased coordination;Decreased knowledge of precautions;Decreased knowledge of use of DME or AE;Cardiopulmonary status limiting activity;Pain      OT Treatment/Interventions: Self-care/ADL training;Therapeutic exercise;DME and/or AE instruction;Therapeutic activities;Patient/family education;Balance training    OT Goals(Current goals can be found in the care plan section) Acute Rehab OT Goals Patient Stated Goal: to get better OT Goal Formulation: With patient Time For Goal Achievement: 05/28/22 Potential to Achieve Goals: Fair  OT Frequency: Min 2X/week       AM-PAC OT "6 Clicks" Daily Activity     Outcome Measure Help from another person eating meals?: A Little Help from another person taking care of personal grooming?: A Little Help from another  person toileting, which includes using toliet, bedpan, or urinal?: Total Help from another person bathing (including washing, rinsing, drying)?: Total Help from another person to put on and taking off regular upper body clothing?: A Lot Help from another person to put on and taking off regular lower body clothing?: Total 6 Click Score: 11   End of Session Nurse Communication: Other (comment) (dizziness with attempts to sit EOB)  Activity Tolerance: Patient limited by fatigue;Other (comment) (dizziness) Patient left: in bed;with call bell/phone within reach;with bed alarm set  OT Visit Diagnosis: Unsteadiness on feet (R26.81);Other abnormalities of gait and mobility (R26.89);Muscle weakness (generalized) (M62.81);Pain                Time: 0930-1000 OT Time Calculation (min): 30 min Charges:  OT General Charges $OT Visit: 1 Visit OT Evaluation $OT Eval Moderate Complexity: 1 Mod OT Treatments $Therapeutic Activity: 8-22 mins  Rennie Plowman, MS Acute Rehabilitation Department Office# [phone]   Willa Rough 05/14/2022, 11:36 AM

## 2022-05-14 NOTE — TOC Initial Note (Addendum)
Transition of Care [facility]) - Initial/Assessment Note    Patient Details  Name: Shelley Flores MRN: [id number] Date of Birth: November 02, 1932  Transition of Care [facility]) CM/SW Contact:    Lennart Pall, LCSW Phone Number: 05/14/2022, 2:24 PM  Clinical Narrative:                  Met with pt today to introduce TOC/ CSW role with dc planning needs.  Pt reports she is currently in an IL apt at [facility], however, had plans to move to ALF apt this month.  We discussed therapy recommendations now are for SNF rehab and she is aware she may need to dc to [place] since they are the location that offers SNF level of care.  She does ask that I speak with admissions coordinators at both locations to discuss plans.  Have left VMs and await contact back from [place].  ADDENDUM:  Have confirmed with facility contacts that plan is for pt to dc to SNF level of care at [facility] for rehab prior to returning to ALF apt.  Pt aware and agreeable.   Expected Discharge Plan: Skilled Nursing Facility Barriers to Discharge: Continued Medical Work up   Patient Goals and CMS Choice Patient states their goals for this hospitalization and ongoing recovery are:: return to ALF or to short term SNF at [facility]          Expected Discharge Plan and Services In-house Referral: Clinical Social Work     Living arrangements for the past 2 months: Materials engineer (@ [place])                 DME Arranged: N/A DME Agency: NA                  Prior Living Arrangements/Services Living arrangements for the past 2 months: Materials engineer (@ [place]) Lives with:: Self Patient language and need for interpreter reviewed:: Yes Do you feel safe going back to the place where you live?: Yes      Need for Family Participation in Patient Care: No (Comment) Care giver support system in place?: Yes (comment)   Criminal Activity/Legal Involvement  Pertinent to Current Situation/Hospitalization: No - Comment as needed  Activities of Daily Living Home Assistive Devices/Equipment: Eyeglasses, Raised toilet seat with rails, Hand-held shower hose, Grab bars in shower, Grab bars around toilet, Walker (specify type) ADL Screening (condition at time of admission) Patient's cognitive ability adequate to safely complete daily activities?: Yes Is the patient deaf or have difficulty hearing?: No Does the patient have difficulty seeing, even when wearing glasses/contacts?: No Does the patient have difficulty concentrating, remembering, or making decisions?: No Patient able to express need for assistance with ADLs?: Yes Does the patient have difficulty dressing or bathing?: Yes Independently performs ADLs?: No Communication: Independent Dressing (OT): Needs assistance Is this a change from baseline?: Change from baseline, expected to last <3days Grooming: Needs assistance Is this a change from baseline?: Change from baseline, expected to last <3 days Feeding: Independent Bathing: Needs assistance Is this a change from baseline?: Change from baseline, expected to last <3 days Toileting: Needs assistance Is this a change from baseline?: Change from baseline, expected to last <3 days In/Out Bed: Needs assistance Is this a change from baseline?: Change from baseline, expected to last <3 days Walks in Home: Independent with device (comment) Does the patient have difficulty walking or climbing stairs?: Yes Weakness  of Legs: Both Weakness of Arms/Hands: Both  Permission Sought/Granted Permission sought to share information with : Chartered certified accountant granted to share information with : Yes, Verbal Permission Granted  Share Information with NAME: Museum/gallery conservator @ [place]  or [place] at [employer]           Emotional Assessment Appearance:: Appears stated age Attitude/Demeanor/Rapport: Engaged Affect (typically  observed): Accepting Orientation: : Oriented to Self, Oriented to Place, Oriented to  Time, Oriented to Situation Alcohol / Substance Use: Not Applicable Psych Involvement: No (comment)  Admission diagnosis:  Closed fracture of left hip, initial encounter ([place]) [S72.002A] Closed left hip fracture, initial encounter ([place]) [S72.002A] Patient Active Problem List   Diagnosis Date Noted   Drug-induced pneumonitis 05/13/2022   Closed left hip fracture, initial encounter ([place]) 05/12/2022   Anemia, chronic disease 03/08/2022   Thrombocytopenia ([place]) 03/08/2022   Orthostatic hypotension 03/04/2022   DOE (dyspnea on exertion) 03/04/2022   Tachycardia 03/04/2022   History of cerebellar stroke 02/26/2022   Chest tube in place    Candidiasis of skin 06/27/2020   Petechiae 06/27/2020   Adenocarcinoma of left lung, stage 4 ([place]) 06/07/2020   Lung cancer ([place]) 05/17/2020   Goals of care, counseling/discussion 05/17/2020   Encounter for antineoplastic chemotherapy 05/17/2020   Mass of upper lobe of left lung 05/15/2020   Edema 05/15/2020   OP (osteoporosis) 05/15/2020   GERD (gastroesophageal reflux disease) 05/15/2020   Hypophosphatemia 05/15/2020   Pleural effusion 05/01/2020   Acute hypoxemic respiratory failure ([place]) 05/01/2020   Hyponatremia 05/01/2020   Hypokalemia 05/01/2020   PVC's (premature ventricular contractions) 02/26/2016   Essential hypertension 12/29/2012   Hyperlipidemia 12/29/2012   Murmur 12/29/2012   PCP:  [name], MD Pharmacy:   [facility], [place] [address] Phone: [phone] Fax: [place] [address] Phone: [phone] Fax: [phone]     Social Determinants of Health (SDOH) Social History: SDOH Screenings   Food Insecurity: No Food Insecurity (05/13/2022)  Housing: Low Risk   (05/13/2022)  Transportation Needs: No Transportation Needs (05/13/2022)  Utilities: Not At Risk (05/13/2022)  Alcohol Screen: Low Risk  (09/10/2021)  Depression (PHQ2-9): Low Risk  (04/12/2022)  Financial Resource Strain: Low Risk  (09/10/2021)  Physical Activity: Sufficiently Active (09/10/2021)  Social Connections: Socially Isolated (09/10/2021)  Stress: No Stress Concern Present (09/10/2021)  Tobacco Use: Medium Risk (05/13/2022)   SDOH Interventions:     Readmission Risk Interventions    05/14/2022    2:21 PM  Readmission Risk Prevention Plan  Transportation Screening Complete  PCP or Specialist Appt within 3-5 Days Complete  HRI or [place] Complete  Social Work Consult for [place] Planning/Counseling Complete

## 2022-05-14 NOTE — Progress Notes (Signed)
PT Cancellation Note  Patient Details Name: NAW LASALA MRN: [id number] DOB: 1932-08-12   Cancelled Treatment:    Reason Eval/Treat Not Completed: Patient not medically ready; pt dizzy with OT,  BP low, elevted HR; Rapid response called. Defer PT eval at this time.   [facility] 05/14/2022, 11:03 AM

## 2022-05-14 NOTE — Progress Notes (Signed)
Patient given Lidocaine patch for lower back (per patient request). Unable to obtain orthostatic vitals due to patient being limited by pain. Vitals only obtained in lying position. Made Dr. Charlynne Cousins, MD aware.

## 2022-05-14 NOTE — Progress Notes (Signed)
MEWS score now green.  

## 2022-05-14 NOTE — Significant Event (Signed)
Rapid Response Event Note   Reason for Call :  Hypotension and tachycardia during therapy.   Initial Focused Assessment:  On arrival patient asleep but arouses easily. BP returned to baseline, HR 107. MD ordered IV Metoprolol however patient has poor IV access. IV team consulted due to poor vasculature. RN reports giving oral Metoprolol as ordered. When talking on the phone HR 120s but returns to under 110 when at rest. Patient requesting to be allowed to fall asleep and requesting to complete orthostatics when she awakens.  Interventions:   Attempted IV acces  Plan of Care:   Trend orthostatic BPs IV Metoprolol  MD Notified: Venetia Constable MD Call Time: Arrival Time: [id number] End Time: [place], RN

## 2022-05-14 NOTE — Progress Notes (Signed)
Initial Nutrition Assessment  DOCUMENTATION CODES:   Severe malnutrition in context of chronic illness, Underweight  INTERVENTION:   -Ensure Enlive po BID, each supplement provides 350 kcal and 20 grams of protein.   -Multivitamin with minerals daily  NUTRITION DIAGNOSIS:   Severe Malnutrition related to chronic illness (h/o lung cancer, CVA) as evidenced by severe fat depletion, severe muscle depletion, percent weight loss.  GOAL:   Patient will meet greater than or equal to 90% of their needs  MONITOR:   PO intake, Supplement acceptance, Weight trends, Labs, I & O's  REASON FOR ASSESSMENT:   Consult Hip fracture protocol  ASSESSMENT:   89 y.o. female past medical history significant for CVA, non-small cell stage IV lung cancer with complicated treatment drug-induced pneumonitis, presents to the ED with left hip pain after mechanical fall x-ray showed a left femoral fracture  1/1: s/p Cephalomedullary nail of left hip fracture   Patient in room, sleeping. Noted that pt had a rapid response called earlier. Pt with history of stage 4 NSCLC, underwent chemotherapy treatment in 2022. Pt now with left hip fracture, s/p surgery 1/1.  Per weight records, pt has lost 13 lbs since 11/1 (12% wt loss x 2 months, significant for time frame).   Medications: Multivitamin with minerals daily, Miralax  Labs reviewed: Low Na   NUTRITION - FOCUSED PHYSICAL EXAM:  Flowsheet Row Most Recent Value  Orbital Region Severe depletion  Upper Arm Region Severe depletion  Thoracic and Lumbar Region Severe depletion  Buccal Region Severe depletion  Temple Region Severe depletion  Clavicle Bone Region Severe depletion  Clavicle and Acromion Bone Region Severe depletion  Scapular Bone Region Severe depletion  Dorsal Hand Severe depletion  Patellar Region Severe depletion  Anterior Thigh Region Severe depletion  Posterior Calf Region Severe depletion  Edema (RD Assessment) None  Hair  Reviewed  Eyes Unable to assess  Mouth Unable to assess  Skin Reviewed  [pale]  Nails Reviewed       Diet Order:   Diet Order             Diet regular Room service appropriate? Yes; Fluid consistency: Thin  Diet effective now                   EDUCATION NEEDS:   No education needs have been identified at this time  Skin:  Skin Assessment: Skin Integrity Issues: Skin Integrity Issues:: Incisions Incisions: 1/1 left hip  Last BM:  12/31  Height:   Ht Readings from Last 1 Encounters:  05/13/22 5\' 4"  (1.626 m)    Weight:   Wt Readings from Last 1 Encounters:  05/13/22 41.5 kg    BMI:  Body mass index is 15.7 kg/m.  Estimated Nutritional Needs:   Kcal:  1600-1800  Protein:  80-90g  Fluid:  1.6L/day   Shelley Bibles, MS, RD, LDN Inpatient Clinical Dietitian Contact information available via Amion

## 2022-05-15 ENCOUNTER — Ambulatory Visit: Payer: Medicare PPO

## 2022-05-15 ENCOUNTER — Other Ambulatory Visit: Payer: Medicare PPO

## 2022-05-15 ENCOUNTER — Ambulatory Visit: Payer: Medicare PPO | Admitting: Physician Assistant

## 2022-05-15 ENCOUNTER — Inpatient Hospital Stay (HOSPITAL_COMMUNITY): Payer: Medicare PPO

## 2022-05-15 ENCOUNTER — Ambulatory Visit: Payer: Medicare PPO | Admitting: Internal Medicine

## 2022-05-15 DIAGNOSIS — E43 Unspecified severe protein-calorie malnutrition: Secondary | ICD-10-CM | POA: Insufficient documentation

## 2022-05-15 DIAGNOSIS — S72002A Fracture of unspecified part of neck of left femur, initial encounter for closed fracture: Secondary | ICD-10-CM | POA: Diagnosis not present

## 2022-05-15 LAB — FOLATE RBC
Folate, Hemolysate: >620 ng/mL
Folate, RBC: >1766 ng/mL (ref >498)
Hematocrit: 35.1 % (ref 34.0–46.6)

## 2022-05-15 LAB — BASIC METABOLIC PANEL
Anion gap: 7 (ref 5–15)
BUN: 31 mg/dL — ABNORMAL HIGH (ref 8–23)
CO2: 22 mmol/L (ref 22–32)
Calcium: 8.6 mg/dL — ABNORMAL LOW (ref 8.9–10.3)
Chloride: 106 mmol/L (ref 98–111)
Creatinine, Ser: 0.71 mg/dL (ref 0.44–1.00)
GFR, Estimated: >60 mL/min (ref >60)
Glucose, Bld: 114 mg/dL — ABNORMAL HIGH (ref 70–99)
Potassium: 4.1 mmol/L (ref 3.5–5.1)
Sodium: 135 mmol/L (ref 135–145)

## 2022-05-15 LAB — CBC
HCT: 22.2 % — ABNORMAL LOW (ref 36.0–46.0)
Hemoglobin: 7.1 g/dL — ABNORMAL LOW (ref 12.0–15.0)
MCH: 32.6 pg (ref 26.0–34.0)
MCHC: 32 g/dL (ref 30.0–36.0)
MCV: 101.8 fL — ABNORMAL HIGH (ref 80.0–100.0)
Platelets: 83 10*3/uL — ABNORMAL LOW (ref 150–400)
RBC: 2.18 MIL/uL — ABNORMAL LOW (ref 3.87–5.11)
RDW: 21.4 % — ABNORMAL HIGH (ref 11.5–15.5)
WBC: 14.3 10*3/uL — ABNORMAL HIGH (ref 4.0–10.5)
nRBC: 0 % (ref 0.0–0.2)

## 2022-05-15 MED ORDER — SODIUM CHLORIDE 0.9 % IV SOLN: INTRAVENOUS | Status: DC

## 2022-05-15 NOTE — Progress Notes (Signed)
PROGRESS NOTE   Shelley Flores  TOI:[id number] DOB: 11-Aug-1932 DOA: 05/12/2022 PCP: [name], MD  Brief Narrative:  89 year old white female Known stage IV lung cancer on chemo (pemetrexed + carboplatin + Immunother q. 21 daily since 02/20/2022 supposed to finish 6 cycles 06/2022-complicated by pneumonitis and chemo has been held based on 04/24/2022 oncology visit started on prednisone at that visit Cerebellar infarct 02/28/2022 HTN Chronic hyponatremia Prior small left pleural effusion, previous Pleurx drain 04/2020 Underlying PVC Reflux  Admit from [facility] ED 12/31 with left hip pain subsequent to fall-she was attempting to transfer between a walker and chair Found to have sodium 132 WBC 29 hemoglobin 10 05/13/2022 underwent cephalomedullary nailing of left hip  Hospital-Problem based course  Left-sided hip fracture -Status post nailing 05/13/2022, WBAT, Lovenox inpatient, aspirin 325 on discharge --Continue oxycodone 2.5-5 every 4 as needed  Low back pain -Follow imaging ordered by orthopedics -Monitor trends  Acute blood loss anemia likely secondary to surgery - Check hemoglobin in a.m. - If continues to have back pain with scan for retroperitoneal hematoma  Mild volume depletion -Start fluids 50 cc/H - Repeat labs a.m.  Drug-induced pneumonitis secondary to chemo Stage IV lung cancer -On steroid taper as per oncology outpatient follow-up - Chemo and other meds held for now  Underlying PVC -Seems to have underlying sinus tach -Continue metoprolol 12.5 twice daily  Prior CVA -Continue aspirin  DVT prophylaxis: Lovenox Code Status: DNR Family Communication: None Disposition:  Status is: Inpatient Remains inpatient appropriate because:   Awaiting skilled bed     Subjective: Awake some discomfort cannot get comfortable in the bed no fever no chills Irregular stool  Objective: Vitals:   05/14/22 1820 05/14/22 2119 05/15/22 0132 05/15/22 0441  BP:  129/81 138/68 139/75 133/77  Pulse: (!) 110 83 (!) 104 (!) 104  Resp: 20 16 18 18   Temp: 98 F (36.7 C) 97.6 F (36.4 C) 97.8 F (36.6 C) 97.9 F (36.6 C)  TempSrc: Oral Oral Oral Oral  SpO2: 98% 99% 100% 100%  Weight:      Height:        Intake/Output Summary (Last 24 hours) at 05/15/2022 0738 Last data filed at 05/15/2022 0556 Gross per 24 hour  Intake 240 ml  Output 500 ml  Net -260 ml   Filed Weights   05/12/22 2154 05/13/22 0248 05/13/22 1056  Weight: 45.4 kg 41.5 kg 41.5 kg    Examination:  EOMI NCAT frail cachectic white female no distress no icterus no pallor CTAB no added sound ROM intact no focal deficit Abdomen slight distention Straight leg raise bilaterally is reasonable with mild pain She is quite weak  Data Reviewed: personally reviewed   CBC    Component Value Date/Time   WBC 14.3 (H) 05/15/2022 0333   RBC 2.18 (L) 05/15/2022 0333   HGB 7.1 (L) 05/15/2022 0333   HGB 9.3 (L) 04/24/2022 1018   HCT 22.2 (L) 05/15/2022 0333   PLT 83 (L) 05/15/2022 0333   PLT 348 04/24/2022 1018   MCV 101.8 (H) 05/15/2022 0333   MCH 32.6 05/15/2022 0333   MCHC 32.0 05/15/2022 0333   RDW 21.4 (H) 05/15/2022 0333   LYMPHSABS 0.2 (L) 05/12/2022 2253   MONOABS 0.9 05/12/2022 2253   EOSABS 0.0 05/12/2022 2253   BASOSABS 0.1 05/12/2022 2253      Latest Ref Rng & Units 05/15/2022    3:33 AM 05/14/2022    3:30 AM 05/13/2022    1:49 PM  CMP  Glucose 70 - 99 mg/dL 114  111    BUN 8 - 23 mg/dL 31  27    Creatinine 0.44 - 1.00 mg/dL 0.71  0.85  0.42   Sodium 135 - 145 mmol/L 135  133    Potassium 3.5 - 5.1 mmol/L 4.1  4.5    Chloride 98 - 111 mmol/L 106  102    CO2 22 - 32 mmol/L 22  21    Calcium 8.9 - 10.3 mg/dL 8.6  9.1       Radiology Studies: DG HIP UNILAT WITH PELVIS 2-3 VIEWS LEFT  Result Date: 05/13/2022 CLINICAL DATA:  Femur fracture.  Left hip IM nail. EXAM: DG HIP (WITH OR WITHOUT PELVIS) 2-3V LEFT COMPARISON:  Preoperative imaging FINDINGS: Four fluoroscopic  spot views of the left hip obtained in the operating room. Intramedullary nail with trans trochanteric and distal locking screw fixation traverse intertrochanteric femur fracture. Fluoroscopy time 33 seconds. Dose 3.72 mGy. IMPRESSION: Intraoperative fluoroscopy during ORIF of left intertrochanteric femur fracture. Electronically Signed   By: Keith Rake M.D.   On: 05/13/2022 12:22   DG C-Arm 1-60 Min-No Report  Result Date: 05/13/2022 Fluoroscopy was utilized by the requesting physician.  No radiographic interpretation.     Scheduled Meds:  aspirin EC  81 mg Oral Daily   atorvastatin  20 mg Oral QHS   docusate sodium  100 mg Oral BID   enoxaparin (LOVENOX) injection  30 mg Subcutaneous Q24H   feeding supplement  237 mL Oral BID BM   lidocaine  1 patch Transdermal Q24H   metoprolol tartrate  12.5 mg Oral BID   multivitamin with minerals  1 tablet Oral Daily   mupirocin ointment  1 Application Nasal BID   polyethylene glycol  17 g Oral BID   predniSONE  30 mg Oral Q breakfast   Continuous Infusions:  methocarbamol (ROBAXIN) IV       LOS: 3 days   Time spent: 8  Nita Sells, MD [facility] To contact the attending provider between 7A-7P or the covering provider during after hours 7P-7A, please log into the web site [email or web address] and access using universal [place] password for that web site. If you do not have the password, please call the hospital operator.  05/15/2022, 7:38 AM

## 2022-05-15 NOTE — Progress Notes (Signed)
     Shelley Flores is a 89 y.o. female    Orthopaedic diagnosis: Left intertrochanteric hip fracture   Surgery: Cephalomedullary nail of the left hip trochanteric fracture 05/13/2022  Subjective: Patient is seen today while undergoing physical therapy.  She is having trouble mobilizing and is feeling lightheaded.  She continues to complain of low back pain feels this is limiting her progress.  She describes no significant pain in the hip.She does tell me she has had a history of bowel issues.  She is also having difficulty urinating presently. She is not eating or drinking much at all.  Objectyive: Vitals:   05/15/22 0441 05/15/22 1016  BP: 133/77 (!) 146/62  Pulse: (!) 104 97  Resp: 18 18  Temp: 97.9 F (36.6 C) 98.1 F (36.7 C)  SpO2: 100% 100%     Exam: Awake and alert Respirations even and unlabored No acute distress  Lamination of left hip and low back demonstrates clean, dry, and intact surgical dressing.  The dressing was not removed.  She tolerates gentle passive range of motion at the hip without significant discomfort.  She is unable to do a straight leg raise actively due to what she describes as weakness but no pain.  Passive straight leg raise is negative for worsening pain.  She does have some tenderness over the lumbosacral region.  Unable to adequately assess the skin in this region initially gets lightheaded with attempted movements.Exposed skin is benign.  Sensation grossly intact in bilateral lower extremities.  Warm and well perfused distally.  Calves are soft and nontender.  Assessment: Postop day 2 status post the above Low back pain   Plan: Patient is doing well in terms of her hip.  She is continuing to complain of pain in the low back and feels it is complicating her recovery.  I did discuss that this may be of medical origin provided her history of abdominal issues as well as difficulty urinating presently. We will order radiographs of the lumbosacral  spine and pelvis to evaluate for any cause of her pain or injury.   -Weightbearing as tolerated left lower extremity with walker -Continue PT/OT while in house -Keep dressing clean, dry, and intact.  Reinforce as needed -30 mg Lovenox daily for DVT prophylaxis while inpatient.  Plan for full-strength aspirin daily x 30 days for DVT prophylaxis outpatient -Pain control as needed   Plan for follow-up outpatient with Dr. Lucia Gaskins 2 weeks from surgery for suture removal if appropriate and x-rays of the left hip.    Shelley Simonian J. [place], PA-C

## 2022-05-15 NOTE — Care Management Important Message (Signed)
Important Message  Patient Details IM Letter given. Name: Shelley Flores MRN: [id number] Date of Birth: 12-18-1932   Medicare Important Message Given:  Yes     Kerin Salen 05/15/2022, 9:01 AM

## 2022-05-15 NOTE — Anesthesia Postprocedure Evaluation (Signed)
Anesthesia Post Note  Patient: Shelley Flores  Procedure(s) Performed: INTRAMEDULLARY (IM) NAIL INTERTROCHANTERIC (Left: Hip)     Patient location during evaluation: PACU Anesthesia Type: General Level of consciousness: awake and alert Pain management: pain level controlled Vital Signs Assessment: post-procedure vital signs reviewed and stable Respiratory status: spontaneous breathing, nonlabored ventilation, respiratory function stable and patient connected to nasal cannula oxygen Cardiovascular status: blood pressure returned to baseline and stable Postop Assessment: no apparent nausea or vomiting Anesthetic complications: no  No notable events documented.  Last Vitals:  Vitals:   05/15/22 0132 05/15/22 0441  BP: 139/75 133/77  Pulse: (!) 104 (!) 104  Resp: 18 18  Temp: 36.6 C 36.6 C  SpO2: 100% 100%    Last Pain:  Vitals:   05/15/22 0741  TempSrc:   PainSc: 0-No pain                 Ranae Casebier S

## 2022-05-15 NOTE — Plan of Care (Signed)
?  Problem: Activity: ?Goal: Risk for activity intolerance will decrease ?Outcome: Progressing ?  ?Problem: Safety: ?Goal: Ability to remain free from injury will improve ?Outcome: Progressing ?  ?Problem: Pain Managment: ?Goal: General experience of comfort will improve ?Outcome: Progressing ?  ?

## 2022-05-15 NOTE — Progress Notes (Signed)
Pt has not urinated since end of day shift yesterday. Pt not on IV fluids and is not drinking very much. Pt denies any complaints of pressure, pain to  or bladder fulness at this time. Encouraged pt to get up to bedside commode with assistance. Pt refused x3 attempts. Reports her back will hurt if she tries to move and she is in an comfortable position right now and does not want to move. Medicated pt with pain medication and muscle relaxer and told pt we would attempt to get her up to  the bedside commode once the medication had a chance to take effect.

## 2022-05-15 NOTE — Evaluation (Signed)
Physical Therapy Evaluation Patient Details Name: Shelley Flores MRN: [id number] DOB: 1932-12-31 Today's Date: 05/15/2022  History of Present Illness  Patient is a 89 year old female who presented to the hosptial after a fall that resulted in L hip pain. Patient was found to have a left femoral neck fracture. Patient underwent IM nail surgery on 1/1. PMH: chemotherapy, stage IV lung cancer, small embolic cerebellar infarct.  Clinical Impression  Pt admitted with above diagnosis.  Pt limited by pain, fatigue and dizziness with sitting. Pt reports recent functional decline, has been amb with a RW in her room, no longer amb to dining room. From ILF with plans to move to ALF at [facility]; recommend SNF   Pt currently with functional limitations due to the deficits listed below (see PT Problem List). Pt will benefit from skilled PT to increase their independence and safety with mobility to allow discharge to the venue listed below.          Recommendations for follow up therapy are one component of a multi-disciplinary discharge planning process, led by the attending physician.  Recommendations may be updated based on patient status, additional functional criteria and insurance authorization.  Follow Up Recommendations Skilled nursing-short term rehab (<3 hours/day) Can patient physically be transported by private vehicle: No    Assistance Recommended at Discharge Frequent or constant Supervision/Assistance  Patient can return home with the following  Two people to help with walking and/or transfers;Two people to help with bathing/dressing/bathroom;Help with stairs or ramp for entrance;Assist for transportation;Assistance with cooking/housework    Equipment Recommendations None recommended by PT  Recommendations for Other Services       Functional Status Assessment Patient has had a recent decline in their functional status and demonstrates the ability to make significant improvements in function  in a reasonable and predictable amount of time.     Precautions / Restrictions Precautions Precautions: Fall Restrictions LLE Weight Bearing: Weight bearing as tolerated      Mobility  Bed Mobility Overal bed mobility: Needs Assistance Bed Mobility: Supine to Sit, Sit to Supine     Supine to sit: Max assist, Total assist Sit to supine: Max assist, Total assist   General bed mobility comments: multi-modal cues for sequence and progression.  incr time and  assist to progress LLE off bed. max/total assist to elevate trunk; assist with LE s and trunk to return to supine; pt dizzy after sitting EOB for ~ 8 minutes    Transfers Overall transfer level: Needs assistance Equipment used: Rolling walker (2 wheels) Transfers: Sit to/from Stand             General transfer comment: attempted after pt able to maintain sitting balance, pt unable to stand    Ambulation/Gait                  Stairs            Wheelchair Mobility    Modified Rankin (Stroke Patients Only)       Balance Overall balance assessment: Needs assistance Sitting-balance support: Feet supported Sitting balance-Leahy Scale: Poor Sitting balance - Comments: with incr time, multi-modal cues  pt able to maintain midline with min/guard assist Postural control: Posterior lean                                   Pertinent Vitals/Pain Pain Assessment Pain Assessment: Faces Faces Pain Scale: Hurts whole lot  Pain Location: back/"tailebone" Pain Descriptors / Indicators: Aching, Sore Pain Intervention(s): Limited activity within patient's tolerance, Monitored during session, Premedicated before session, Repositioned, Ice applied    Home Living Family/patient expects to be discharged to:: Skilled nursing facility                        Prior Function Prior Level of Function : Independent/Modified Independent             Mobility Comments: uses Rollator; (pt reports  recent decline,not amb to dining room at [facility])       Hand Dominance        Extremity/Trunk Assessment   Upper Extremity Assessment Upper Extremity Assessment: Defer to OT evaluation;Overall [facility] for tasks assessed    Lower Extremity Assessment Lower Extremity Assessment: LLE deficits/detail LLE Deficits / Details: ankle WFL, knee and hip AAROM grossly WFL, testing limited by pain       Communication   Communication: No difficulties  Cognition Arousal/Alertness: Awake/alert Behavior During Therapy: WFL for tasks assessed/performed Overall Cognitive Status: Within Functional Limits for tasks assessed                                          General Comments      Exercises     Assessment/Plan    PT Assessment Patient needs continued PT services  PT Problem List Decreased strength;Decreased activity tolerance;Decreased mobility;Decreased knowledge of use of DME;Decreased knowledge of precautions;Decreased balance       PT Treatment Interventions DME instruction;Therapeutic exercise;Gait training;Functional mobility training;Therapeutic activities;Patient/family education    PT Goals (Current goals can be found in the Care Plan section)  Acute Rehab PT Goals PT Goal Formulation: With patient Time For Goal Achievement: 05/29/22 Potential to Achieve Goals: Fair    Frequency Min 3X/week     Co-evaluation               AM-PAC PT "6 Clicks" Mobility  Outcome Measure Help needed turning from your back to your side while in a flat bed without using bedrails?: Total Help needed moving from lying on your back to sitting on the side of a flat bed without using bedrails?: Total Help needed moving to and from a bed to a chair (including a wheelchair)?: Total Help needed standing up from a chair using your arms (e.g., wheelchair or bedside chair)?: Total Help needed to walk in hospital room?: Total Help needed climbing 3-5 steps with a railing? :  Total 6 Click Score: 6    End of Session Equipment Utilized During Treatment: Gait belt Activity Tolerance: Patient limited by fatigue;Patient limited by pain;Treatment limited secondary to medical complications (Comment) Patient left: with call bell/phone within reach;in bed;with bed alarm set (dizzy)   PT Visit Diagnosis: Other abnormalities of gait and mobility (R26.89);Muscle weakness (generalized) (M62.81);Difficulty in walking, not elsewhere classified (R26.2);History of falling (Z91.81)    Time: 6073-7106 PT Time Calculation (min) (ACUTE ONLY): 28 min   Charges:   PT Evaluation $PT Eval Low Complexity: 1 Low PT Treatments $Therapeutic Activity: 8-22 mins        Baxter Flattery, PT  Acute Rehab Dept [facility]) [phone]  [facility] Weekend Pager [facility] only)  [phone]  05/15/2022   [facility] 05/15/2022, 2:25 PM

## 2022-05-16 ENCOUNTER — Ambulatory Visit: Payer: Medicare PPO | Admitting: Nurse Practitioner

## 2022-05-16 DIAGNOSIS — S72002A Fracture of unspecified part of neck of left femur, initial encounter for closed fracture: Secondary | ICD-10-CM | POA: Diagnosis not present

## 2022-05-16 LAB — BASIC METABOLIC PANEL
Anion gap: 6 (ref 5–15)
BUN: 33 mg/dL — ABNORMAL HIGH (ref 8–23)
CO2: 22 mmol/L (ref 22–32)
Calcium: 9 mg/dL (ref 8.9–10.3)
Chloride: 107 mmol/L (ref 98–111)
Creatinine, Ser: 0.64 mg/dL (ref 0.44–1.00)
GFR, Estimated: >60 mL/min (ref >60)
Glucose, Bld: 96 mg/dL (ref 70–99)
Potassium: 3.9 mmol/L (ref 3.5–5.1)
Sodium: 135 mmol/L (ref 135–145)

## 2022-05-16 LAB — CBC
HCT: 22.2 % — ABNORMAL LOW (ref 36.0–46.0)
Hemoglobin: 7.2 g/dL — ABNORMAL LOW (ref 12.0–15.0)
MCH: 32.7 pg (ref 26.0–34.0)
MCHC: 32.4 g/dL (ref 30.0–36.0)
MCV: 100.9 fL — ABNORMAL HIGH (ref 80.0–100.0)
Platelets: 104 10*3/uL — ABNORMAL LOW (ref 150–400)
RBC: 2.2 MIL/uL — ABNORMAL LOW (ref 3.87–5.11)
RDW: 21.4 % — ABNORMAL HIGH (ref 11.5–15.5)
WBC: 14.6 10*3/uL — ABNORMAL HIGH (ref 4.0–10.5)
nRBC: 0 % (ref 0.0–0.2)

## 2022-05-16 LAB — PREPARE RBC (CROSSMATCH)

## 2022-05-16 MED ORDER — SODIUM CHLORIDE 0.9% IV SOLUTION: Freq: Once | INTRAVENOUS | Status: AC

## 2022-05-16 MED ORDER — DIPHENHYDRAMINE HCL 50 MG/ML IJ SOLN
12.5000 mg | Freq: Once | INTRAMUSCULAR | Status: AC
  Administered 2022-05-16: 12.5 mg via INTRAVENOUS
  Filled 2022-05-16: qty 1

## 2022-05-16 MED ORDER — FUROSEMIDE 10 MG/ML IJ SOLN
20.0000 mg | Freq: Once | INTRAMUSCULAR | Status: AC
  Administered 2022-05-16: 20 mg via INTRAVENOUS
  Filled 2022-05-16: qty 2

## 2022-05-16 MED ORDER — SORBITOL 70 % SOLN
30.0000 mL | Freq: Every day | Status: DC
  Administered 2022-05-16 – 2022-05-20 (×4): 30 mL via ORAL
  Filled 2022-05-16 (×5): qty 30

## 2022-05-16 MED ORDER — ACETAMINOPHEN 10 MG/ML IV SOLN
500.0000 mg | Freq: Four times a day (QID) | INTRAVENOUS | Status: AC
  Administered 2022-05-16 (×2): 500 mg via INTRAVENOUS
  Filled 2022-05-16 (×4): qty 50

## 2022-05-16 MED ORDER — DIPHENHYDRAMINE HCL 25 MG PO CAPS: 25.0000 mg | ORAL_CAPSULE | Freq: Once | ORAL | Status: DC

## 2022-05-16 MED ORDER — ACETAMINOPHEN 325 MG PO TABS: 650.0000 mg | ORAL_TABLET | Freq: Once | ORAL | Status: DC

## 2022-05-16 NOTE — Progress Notes (Addendum)
PROGRESS NOTE   Shelley Flores  WGN:[id number] DOB: 06-Feb-1933 DOA: 05/12/2022 PCP: [name], MD  Brief Narrative:  89 year old white female Known stage IV lung cancer on chemo (pemetrexed + carboplatin + Immunother q. 21 daily since 02/20/2022 supposed to finish 6 cycles 06/2022-complicated by pneumonitis and chemo has been held based on 04/24/2022 oncology visit started on prednisone at that visit Cerebellar infarct 02/28/2022 HTN Chronic hyponatremia Prior small left pleural effusion, previous Pleurx drain 04/2020 Underlying PVC Reflux  Admit from [facility] ED 12/31 with left hip pain subsequent to fall-she was attempting to transfer between a walker and chair Found to have sodium 132 WBC 29 hemoglobin 10 05/13/2022 underwent cephalomedullary nailing of left hip Hospitalization complicated by dysphagia aphasia  Hospital-Problem based course  Left-sided hip fracture -Status post nailing 05/13/2022, WBAT, Lovenox inpatient, aspirin 325 on discharge --Continue oxycodone 2.5-5 every 4 as needed  Moderate oropharyngeal dysphagia -Await SLP input and try to convert meds to IV - May need instrumentation or MBS  Constipation with large stool burden -Continue Reglan MiraLAX 17-added sorbitol 30 mL daily to see if this helps  Low back pain - Low back x-rays performed 1/3 showed multilevel degenerative changes  Acute blood loss anemia likely secondary to surgery - Check hemoglobin in a.m. 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 Stage IV lung cancer -On steroid taper as per oncology outpatient follow-up - Chemo and other meds held for now  Underlying PVC -Seems to have underlying sinus tach -Continue metoprolol 12.5 twice daily  Prior CVA -Continue aspirin  DVT prophylaxis: Lovenox Code Status: DNR Family Communication:  None Disposition:  Status is: Inpatient Remains inpatient appropriate because:   Awaiting skilled bed     Subjective:  Awake coherent "I cannot eat" Seems like she needs to sit up Has never had this issue before outside of the hospital-has never had any dysphagia procedures done She has not had a stool in several days Pain is moderate  Objective: Vitals:   05/15/22 0441 05/15/22 1016 05/15/22 2011 05/16/22 0428  BP: 133/77 (!) 146/62 (!) 144/69 (!) 146/83  Pulse: (!) 104 97 (!) 102 (!) 108  Resp: 18 18 18 18   Temp: 97.9 F (36.6 C) 98.1 F (36.7 C) (!) 97.3 F (36.3 C) 97.8 F (36.6 C)  TempSrc: Oral Oral  Oral  SpO2: 100% 100% 97% 94%  Weight:      Height:        Intake/Output Summary (Last 24 hours) at 05/16/2022 0915 Last data filed at 05/16/2022 0600 Gross per 24 hour  Intake 840 ml  Output 1050 ml  Net -210 ml    Filed Weights   05/12/22 2154 05/13/22 0248 05/13/22 1056  Weight: 45.4 kg 41.5 kg 41.5 kg    Examination:  EOMI NCAT frail cachectic white female no distress no icterus chest clear no rales rhonchi or wheeze ROM intact But antalgic to the left hip She has no breakdown on her lower back Neurologically is intact  Data Reviewed: personally reviewed   CBC    Component Value Date/Time   WBC 14.6 (H) 05/16/2022 0324   RBC 2.20 (L) 05/16/2022 0324   HGB 7.2 (L) 05/16/2022 0324   HGB 9.3 (L) 04/24/2022 1018   HCT 22.2 (L) 05/16/2022 0324   HCT 35.1 05/13/2022 1323   PLT 104 (L) 05/16/2022 0324  PLT 348 04/24/2022 1018   MCV 100.9 (H) 05/16/2022 0324   MCH 32.7 05/16/2022 0324   MCHC 32.4 05/16/2022 0324   RDW 21.4 (H) 05/16/2022 0324   LYMPHSABS 0.2 (L) 05/12/2022 2253   MONOABS 0.9 05/12/2022 2253   EOSABS 0.0 05/12/2022 2253   BASOSABS 0.1 05/12/2022 2253      Latest Ref Rng & Units 05/16/2022    3:24 AM 05/15/2022    3:33 AM 05/14/2022    3:30 AM  CMP  Glucose 70 - 99 mg/dL 96  114  111   BUN 8 - 23 mg/dL 33  31  27   Creatinine  0.44 - 1.00 mg/dL 0.64  0.71  0.85   Sodium 135 - 145 mmol/L 135  135  133   Potassium 3.5 - 5.1 mmol/L 3.9  4.1  4.5   Chloride 98 - 111 mmol/L 107  106  102   CO2 22 - 32 mmol/L 22  22  21    Calcium 8.9 - 10.3 mg/dL 9.0  8.6  9.1      Radiology Studies: DG Pelvis 1-2 Views  Result Date: 05/15/2022 CLINICAL DATA:  Low back pain. EXAM: PELVIS - 1-2 VIEW COMPARISON:  Intraoperative fluoroscopic images 1124. Pelvic/left hip radiographs 05/12/2022. FINDINGS: ORIF of a left femoral neck fracture is again noted with placement of an intramedullary nail and screw terminating in the femoral head. Alignment appears near anatomic on this single projection. No new fracture is identified. The bones are diffusely osteopenic. IMPRESSION: ORIF of left femoral neck fracture. Electronically Signed   By: Logan Bores M.D.   On: 05/15/2022 16:35   DG Lumbar Spine 2-3 Views  Result Date: 05/15/2022 CLINICAL DATA:  Low back pain EXAM: LUMBAR SPINE - 2-3 VIEW COMPARISON:  CT 05/09/2022. FINDINGS: There is a mild curvature of the thoracic spine which appears convex towards the left. The vertebral body heights appear well preserved. No signs of acute fracture. Stepwise retrolisthesis of L1 on L2, L2 on L3 and L3 on L4 noted. Multilevel disc space narrowing and endplate spurring is noted. This is most advanced at L2-3. Facet arthropathy noted at L4-5 and L5-S1. Large stool burden noted within the visualized portions of the colon IMPRESSION: 1. No acute findings. 2. Multilevel degenerative disc disease and facet arthropathy. 3. Large stool burden within the visualized portions of the colon. Correlate for any clinical signs or symptoms of constipation. Electronically Signed   By: Kerby Moors M.D.   On: 05/15/2022 16:29     Scheduled Meds:  sodium chloride   Intravenous Once   acetaminophen  650 mg Oral Once   aspirin EC  81 mg Oral Daily   atorvastatin  20 mg Oral QHS   diphenhydrAMINE  25 mg Oral Once    diphenhydrAMINE  12.5 mg Intravenous Once   enoxaparin (LOVENOX) injection  30 mg Subcutaneous Q24H   feeding supplement  237 mL Oral BID BM   furosemide  20 mg Intravenous Once   lidocaine  1 patch Transdermal Q24H   metoprolol tartrate  12.5 mg Oral BID   multivitamin with minerals  1 tablet Oral Daily   mupirocin ointment  1 Application Nasal BID   predniSONE  30 mg Oral Q breakfast   sorbitol  30 mL Oral Daily   Continuous Infusions:  sodium chloride 50 mL/hr at 05/15/22 1435   acetaminophen     methocarbamol (ROBAXIN) IV       LOS: 4 days   Time spent:  [place], MD [facility] To contact the attending provider between 7A-7P or the covering provider during after hours 7P-7A, please log into the web site [email or web address] and access using universal [place] password for that web site. If you do not have the password, please call the hospital operator.  05/16/2022, 9:15 AM

## 2022-05-16 NOTE — Plan of Care (Signed)

## 2022-05-16 NOTE — Evaluation (Signed)
Clinical/Bedside Swallow Evaluation Patient Details  Name: Shelley Flores MRN: [id number] Date of Birth: 09-Feb-1933  Today's Date: 05/16/2022 Time: SLP Start Time (ACUTE ONLY): 17 SLP Stop Time (ACUTE ONLY): 1825 SLP Time Calculation (min) (ACUTE ONLY): 46 min  Past Medical History:  Past Medical History:  Diagnosis Date   Bruit    Abdominal bruit - Abdominal aorta/renal duplex Doppler evaluation 12/07/03 -Mildly abnormal evaluation. 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 This is consistant w/median arcuate ligament compression syndrome. *Right & Left Kidney: Essentially equal in size, symmetrical in shape w/no significant abnormalities visualized. *Right & Left Renal Arteries: No significant abnormalities.   Cancer ([place])    Edema extremities    LE edema    H/O myocardial perfusion scan 02/20/00   To rule out ischemia - Negative adequate Bruce protocol exercise stress test with a deconditioned exercise response and normal static myocardial perfusion images with EF calculated by QGS of 77%. Represents a low risk study.   Hyperlipidemia    Hypertension    Osteoarthritis    Osteoporosis    Pleural effusion 04/2020   Valvular heart disease    Mild valvular heart disease by Echo in 2010, all mild and symptomatic, including concentric LVH, MR, TR, and AI with pulmonary artery pressure of 32. EF was normal.   Past Surgical History:  Past Surgical History:  Procedure Laterality Date   Abdominal aorta/Renal duplex Doppler Evaluation  12/07/03   For abdominal bruit. Mildly abnormal evaluation. (See bruit in medical history)   BREAST EXCISIONAL BIOPSY Left 1966   BREAST EXCISIONAL BIOPSY Left 1999   CATARACT EXTRACTION  2003   CHEST TUBE INSERTION N/A 07/04/2020   Procedure: REMOVAL PLEURAL DRAINAGE CATHETER;  Surgeon: Garner Nash, DO;  Location: [place];  Service: Pulmonary;  Laterality: N/A;   COLONOSCOPY  10/22/2004   DEXA Bone Scan  06/23/2013   INTRAMEDULLARY  (IM) NAIL INTERTROCHANTERIC Left 05/13/2022   Procedure: INTRAMEDULLARY (IM) NAIL INTERTROCHANTERIC;  Surgeon: Erle Crocker, MD;  Location: [facility];  Service: Orthopedics;  Laterality: Left;   IR PERC PLEURAL DRAIN W/INDWELL CATH W/IMG GUIDE  05/10/2020   IR THORACENTESIS ASP PLEURAL SPACE W/IMG GUIDE  05/04/2020   HPI:  Patient is seen for swallow eval per order.  Pt with PMH + for lung cancer, has undergone pill chemo treatment - and reports recently stopped chemo due to concerns for inflammation- she was put on a steroid taper.  She was admitted after fall and found to have hip fx, s/p surgery.  Swallow evaluation ordered as pt reported being "scared to swallow" and "choking on water".  Pt admits her intake was poor prior to admission due to gustatory changes associated with chemo treatment. , she has a small bowel movement every few days at home prior to admit. Today pt was lethargic earlier and not adequately alert for swallow eval or po intake.  She is s/p blood transfusion, which per RN has improved her strength.  Positoning is suboptimal for pt due to her pain on her bottom *RN reports blanced spot* and back pain - she only allows HOB elevated approx 35* MAX. Marland KitchenShe does tell me she has had a history of bowel issues.  She is not eating or drinking much at all during this hospital stay.  Pt endorses occasional sensation of food or drink slowly clearing proximal esophagus - again stating this is new.  Question if pt may have presbyesophagus that is exacerbated with current illness.  She denies  reflux.    Assessment / Plan / Recommendation  Clinical Impression  Pt currently is weak, xerostomic and will only allow HOB raised to 35* due to discomfort of her sacral area.  She has left lateral lingual contusion - denies discomfort with palpation.  Using reverse trendelenburg, 2 pillows propped and HOB up to 35*, optimal position achieved.  Pt without focal CN deficits - but her cough is weak and she is  cachetic.  She has h/o cerebellar CVA, denies issues with swallowing prior to admission.  SLP provided pt with intake including water, nectar thick juice, applesauce, and 2 bites of graham cracker. Swallow was audible with laryngeal elevation observed.  Subtle cough noted with bolus of thin water - which may indicate airway infiltration and this consistency is pt reported most challenging.  No clinical indication of aspiration or dysphagia with nectar, applesauce nor solids *though pt with prolonged mastication.  And no overt indication of pharyngeal retention.  Due to her h/o CVA, report of coughing with thin liquids and "fear" of eating/swallowing - will initiate dys3/nectar diet and allow thin water when not consuming solids or medications.  Hopefully these dysphagia symptoms will resolve with improved strengthening.  Also questions presbyesophagus with exacerbation due to pt reporting sensing slow clearing at times pointing distally. Using teach back, discussed need for proper positioning for po given pt's lack of mobility currently and weakness - increasing potential pulmonary ramifications. It pt's dysphagia symptoms do not resolve, she may benefit from [facility] during hospital coarse. SLP Visit Diagnosis: Dysphagia, unspecified (R13.10)    Aspiration Risk  Mild aspiration risk    Diet Recommendation Dysphagia 3 (Mech soft);Nectar-thick liquid;Free water protocol after oral care   Liquid Administration via: Cup;Spoon;No straw Medication Administration: Whole meds with puree Supervision: Staff to assist with self feeding Compensations: Small sips/bites;Slow rate Postural Changes: Other (Comment) (see sign for bed position)    Other  Recommendations Oral Care Recommendations: Oral care BID    Recommendations for follow up therapy are one component of a multi-disciplinary discharge planning process, led by the attending physician.  Recommendations may be updated based on patient status, additional  functional criteria and insurance authorization.  Follow up Recommendations  (TBD)      Assistance Recommended at Discharge  full  Functional Status Assessment Patient has had a recent decline in their functional status and demonstrates the ability to make significant improvements in function in a reasonable and predictable amount of time.  Frequency and Duration min 1 x/week  1 week       Prognosis Prognosis for Safe Diet Advancement: Good Barriers to Reach Goals: Motivation      Swallow Study   General Date of Onset: 05/16/22 HPI: Patient is seen for swallow eval per order.  Pt with PMH + for lung cancer, has undergone pill chemo treatment - and reports recently stopped chemo due to concerns for inflammation- she was put on a steroid taper.  She was admitted after fall and found to have hip fx, s/p surgery.  Swallow evaluation ordered as pt reported being "scared to swallow" and "choking on water".  Pt admits her intake was poor prior to admission due to gustatory changes associated with chemo treatment. , she has a small bowel movement every few days at home prior to admit. Today pt was lethargic earlier and not adequately alert for swallow eval or po intake.  She is s/p blood transfusion, which per RN has improved her strength.  Positoning is suboptimal for pt  due to her pain on her bottom *RN reports blanced spot* and back pain - she only allows HOB elevated approx 35* MAX. Marland KitchenShe does tell me she has had a history of bowel issues.  She is not eating or drinking much at all during this hospital stay.  Pt endorses occasional sensation of food or drink slowly clearing proximal esophagus - again stating this is new.  Question if pt may have presbyesophagus that is exacerbated with current illness.  She denies reflux. Type of Study: Bedside Swallow Evaluation Previous Swallow Assessment: none Diet Prior to this Study: NPO Temperature Spikes Noted: No Respiratory Status: Room air History of  Recent Intubation:  (LMA) Behavior/Cognition: Alert;Cooperative Oral Cavity Assessment: Other (comment) (contusion on left lateral tongue, pt denies discomfort) Oral Care Completed by SLP: No Oral Cavity - Dentition: Adequate natural dentition Vision: Functional for self-feeding Self-Feeding Abilities: Able to feed self Patient Positioning: Partially reclined;Other (comment) Baseline Vocal Quality: Low vocal intensity Volitional Cough: Weak    Oral/Motor/Sensory Function Overall Oral Motor/Sensory Function: Within functional limits   Ice Chips Ice chips: Not tested (pt was cold and did not want to try cold items)   Thin Liquid Thin Liquid: Impaired Presentation: Cup;Self Fed;Spoon;Straw Pharyngeal  Phase Impairments: Cough - Delayed    Nectar Thick Nectar Thick Liquid: Within functional limits Presentation: Cup;Self Fed;Spoon;Straw   Honey Thick Honey Thick Liquid: Not tested   Puree Puree: Within functional limits Presentation: Spoon;Self Fed   Solid     Solid: Impaired Presentation: Self Fed Oral Phase Functional Implications: Prolonged oral transit Pharyngeal Phase Impairments: Suspected delayed Hampton Abbot 05/16/2022,7:05 PM  Kathleen Lime, MS [facility] SLP Acute Rehab Services Office [phone] Pager [phone]

## 2022-05-16 NOTE — Progress Notes (Signed)
Patient had very large, incontinent void. Unable to measure. Noted due to concern for patient not being able to void. Ivan Anchors, RN 05/16/22 3:35 PM

## 2022-05-16 NOTE — Progress Notes (Signed)
Patient bladder scanned just prior to change of shift for 98 cc's, currently patient does not feel the urge to urinate and is not distended, her po intake is very poor as she states she has no appetite and is afraid to drink much due to "choking", offered to get patient up to the bedside commode to facilitate voiding but she refused saying it hurts too much to move and "they tried that last night", offered bedpan and also purewick but patient refused at this time saying "the wick won't work", will continue to monitor for voiding and will bladder scan closer to morning if no success.

## 2022-05-16 NOTE — Progress Notes (Signed)
Shelley Flores is a 89 y.o. female   Orthopaedic diagnosis: Left intertrochanteric hip fracture   Surgery: Cephalomedullary nail of the left hip trochanteric fracture 05/13/2022  Subjective: Patient resting in bed during rounds this morning.  She reports she feels very weak.  She notes lightheadedness attempted mobilization.  Has not been out of bed yet due to this.  She reports pain in the left hip and low back are controlled at this point.  She continues to have issues with urination.  She has not had a bowel movement yet.  She is not eating or drinking much at all at this point as she is fearful of choking.  Denies any numbness tingling or radiating pain down lower extremities.  Her hemoglobin is down to 7.2.  She is preparing for blood transfusion today.  Objectyive: Vitals:   05/16/22 1227 05/16/22 1256  BP: (!) 125/55 (!) 124/57  Pulse: 76 73  Resp: 16 18  Temp: 97.6 F (36.4 C) 98.1 F (36.7 C)  SpO2: 94% 96%     Exam: Awake and alert Respirations even and unlabored No acute distress Frail-appearing  Examination of the left hip and the back demonstrates clean, dry, and intact surgical dressing about the hip.  The dressing was not removed.  She tolerates gentle passive range of motion of the hip and knee without significant discomfort.  She is unable to actively range the hip or knee due to weakness.  Persists with some tenderness palpation in the lumbosacral region.  Unable to adequately assess skin in this region she gets lightheaded to attempted movements.  There is no obvious swelling or crepitation in this area.  Exposed skin is benign.  Sensation grossly intact distally in the bilateral lower extremities.  Warm well-perfused distally.  Calf soft nontender.  We did discuss findings of radiographs obtained of the pelvis and lumbosacral spine yesterday.  This shows no obvious acute bony abnormalities. Significant multilevel degenerative changes in the lumbar spine.   There is also dilated loops of bowel and concern for stool burden.  Assessment: Postop day 3 status post the above Low back pain with evidence of multilevel degenerative changes by radiographs -no acute fracture Anemia, hemoglobin down to 7.2 Poor p.o. intake   Plan: We discussed radiograph findings of her low back and pelvis.  She does have multilevel degenerative changes in the low back which may be contributing to her low back pain.  Presently this is reasonably controlled.  She is encouraged is no obvious acute fracture.  She has little to no pain in the left hip.   Her medical issues involving difficulty mobilizing, eating, drinking, and with bowel and bladder habits is complicating her recovery.  Planning for blood transfusion today and further medical treatment and workup.  There is no indication for further orthopedic workup or intervention at this time.  As result we will sign off and plan to revisit the patient in the outpatient setting.  Certainly if there is ongoing trouble while inpatient or indications for repeat orthopedic workup or intervention we will be happy to reevaluate.  -Weightbearing as tolerated left lower extremity with walker -Continue PT/OT while in house -Keep dressing clean, dry, and intact.  Reinforce as needed -30 mg Lovenox daily for DVT prophylaxis while inpatient.  Plan for full-strength aspirin daily x 30 days for DVT prophylaxis outpatient -Pain control as needed   Plan for follow-up outpatient with Dr. Lucia Gaskins 2 weeks from surgery for suture removal if appropriate and  x-rays of the left hip.  Henretter Piekarski J. [place], PA-C

## 2022-05-16 NOTE — Progress Notes (Signed)
Updates provided to Dr. Verlon Au throughout morning regarding assessment findings including pallor, weakness, and patient stating she is unable to swallow. NPO status until evaluated by Speech Therapist. Some meds given IV route. Preparing for blood transfusion. Ivan Anchors, RN 05/16/22 12:05 PM

## 2022-05-17 DIAGNOSIS — S72002A Fracture of unspecified part of neck of left femur, initial encounter for closed fracture: Secondary | ICD-10-CM | POA: Diagnosis not present

## 2022-05-17 LAB — TYPE AND SCREEN
ABO/RH(D): A POS
Antibody Screen: NEGATIVE
Unit division: 0

## 2022-05-17 LAB — CBC WITH DIFFERENTIAL/PLATELET
Abs Immature Granulocytes: 0.05 10*3/uL (ref 0.00–0.07)
Basophils Absolute: 0 10*3/uL (ref 0.0–0.1)
Basophils Relative: 0 %
Eosinophils Absolute: 0 10*3/uL (ref 0.0–0.5)
Eosinophils Relative: 0 %
HCT: 29.9 % — ABNORMAL LOW (ref 36.0–46.0)
Hemoglobin: 10.1 g/dL — ABNORMAL LOW (ref 12.0–15.0)
Immature Granulocytes: 1 %
Lymphocytes Relative: 1 %
Lymphs Abs: 0.1 10*3/uL — ABNORMAL LOW (ref 0.7–4.0)
MCH: 31.8 pg (ref 26.0–34.0)
MCHC: 33.8 g/dL (ref 30.0–36.0)
MCV: 94 fL (ref 80.0–100.0)
Monocytes Absolute: 0.1 10*3/uL (ref 0.1–1.0)
Monocytes Relative: 2 %
Neutro Abs: 9.1 10*3/uL — ABNORMAL HIGH (ref 1.7–7.7)
Neutrophils Relative %: 96 %
Platelets: 95 10*3/uL — ABNORMAL LOW (ref 150–400)
RBC: 3.18 MIL/uL — ABNORMAL LOW (ref 3.87–5.11)
RDW: 21.2 % — ABNORMAL HIGH (ref 11.5–15.5)
WBC: 9.5 10*3/uL (ref 4.0–10.5)
nRBC: 0 % (ref 0.0–0.2)

## 2022-05-17 LAB — BPAM RBC
Blood Product Expiration Date: 202401252359
ISSUE DATE / TIME: 202401041233
Unit Type and Rh: 6200

## 2022-05-17 LAB — BASIC METABOLIC PANEL
Anion gap: 8 (ref 5–15)
BUN: 26 mg/dL — ABNORMAL HIGH (ref 8–23)
CO2: 23 mmol/L (ref 22–32)
Calcium: 8.8 mg/dL — ABNORMAL LOW (ref 8.9–10.3)
Chloride: 107 mmol/L (ref 98–111)
Creatinine, Ser: 0.59 mg/dL (ref 0.44–1.00)
GFR, Estimated: >60 mL/min (ref >60)
Glucose, Bld: 113 mg/dL — ABNORMAL HIGH (ref 70–99)
Potassium: 3.9 mmol/L (ref 3.5–5.1)
Sodium: 138 mmol/L (ref 135–145)

## 2022-05-17 MED ORDER — OXYCODONE HCL 5 MG PO TABS
2.5000 mg | ORAL_TABLET | ORAL | Status: DC | PRN
  Administered 2022-05-19: 2.5 mg via ORAL
  Filled 2022-05-17: qty 1

## 2022-05-17 MED ORDER — ASPIRIN 325 MG PO TBEC: 325.0000 mg | DELAYED_RELEASE_TABLET | Freq: Every day | ORAL | 0 refills | Status: AC

## 2022-05-17 MED ORDER — LIDOCAINE 5 % EX PTCH: 1.0000 | MEDICATED_PATCH | CUTANEOUS | 0 refills | Status: DC

## 2022-05-17 MED ORDER — PREDNISONE 10 MG PO TABS: ORAL_TABLET | ORAL | 0 refills | Status: AC

## 2022-05-17 MED ORDER — SORBITOL 70 % SOLN
960.0000 mL | TOPICAL_OIL | Freq: Once | ORAL | Status: AC
  Administered 2022-05-17: 960 mL via RECTAL
  Filled 2022-05-17: qty 240

## 2022-05-17 MED ORDER — POLYETHYLENE GLYCOL 3350 17 G PO PACK: 17.0000 g | PACK | Freq: Every day | ORAL | 0 refills | Status: DC | PRN

## 2022-05-17 MED ORDER — SORBITOL 70 % SOLN: 30.0000 mL | Freq: Every day | Status: DC

## 2022-05-17 MED ORDER — SORBITOL 70 % SOLN: 200.0000 mL | TOPICAL_OIL | Freq: Once | ORAL | Status: DC

## 2022-05-17 MED ORDER — OXYCODONE HCL 5 MG PO TABS: 2.5000 mg | ORAL_TABLET | ORAL | 0 refills | Status: DC | PRN

## 2022-05-17 MED ORDER — METHOCARBAMOL 500 MG PO TABS: 500.0000 mg | ORAL_TABLET | Freq: Four times a day (QID) | ORAL | 0 refills | Status: DC | PRN

## 2022-05-17 NOTE — Progress Notes (Signed)
Physical Therapy Treatment Patient Details Name: Shelley Flores MRN: [id number] DOB: Sep 25, 1932 Today's Date: 05/17/2022   History of Present Illness Patient is a 89 year old female who presented to the hosptial after a fall that resulted in L hip pain. Patient was found to have a left femoral neck fracture. Patient underwent IM nail surgery on 1/1. PMH: chemotherapy, stage IV lung cancer, small embolic cerebellar infarct.    PT Comments    POD # 4  Pt resides at [facility] living.  She is a retired Conservation officer, nature. She is AxO x 3 slightly anxious/nervous/fearful of falling.  She required + 2 assist for OOB. General bed mobility comments: Required increased time and + 2 Mas Assist to swival/scoot to EOB using bed pad.  Mild c/o dizzyness but subsided with rest break. General transfer comment: required + 2 side by side assist from elevated bed to [facility] and again from [facility] to recliner. General Gait Details: Transfer only this session as pt was unable to safely pivot/unable to weight shift onto L LE due to pain. Positioned in recliner with multiple pillows. Pt will need ST Rehab at SNF to address mobility and functional decline prior to safely returning home [facility].    Recommendations for follow up therapy are one component of a multi-disciplinary discharge planning process, led by the attending physician.  Recommendations may be updated based on patient status, additional functional criteria and insurance authorization.  Follow Up Recommendations  Skilled nursing-short term rehab (<3 hours/day)     Assistance Recommended at Discharge    Patient can return home with the following Two people to help with walking and/or transfers;Two people to help with bathing/dressing/bathroom;Help with stairs or ramp for entrance;Assist for transportation;Assistance with cooking/housework   Equipment Recommendations  None recommended by PT    Recommendations for Other Services        Precautions / Restrictions Precautions Precautions: Fall Restrictions Weight Bearing Restrictions: No LLE Weight Bearing: Weight bearing as tolerated     Mobility  Bed Mobility Overal bed mobility: Needs Assistance Bed Mobility: Supine to Sit     Supine to sit: Max assist, Total assist     General bed mobility comments: Required increased time and + 2 Mas Assist to swival/scoot to EOB using bed pad.  Mild c/o dizzyness but subsided with rest break.    Transfers Overall transfer level: Needs assistance Equipment used: Rolling walker (2 wheels) Transfers: Sit to/from Stand             General transfer comment: required + 2 side by side assist from elevated bed to [facility] and again from [facility] to recliner.    Ambulation/Gait               General Gait Details: Transfer only this session as pt was unable to safely pivot/unable to weight shift onto L LE due to pain.   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        Pertinent Vitals/Pain Pain Assessment Pain Assessment: 0-10 Pain Score: 7  Pain Location: L hip Pain Descriptors / Indicators: Aching, Grimacing, Operative site guarding Pain Intervention(s): Monitored during session, Premedicated before session, Repositioned, Ice applied    Home Living                          Prior Function            PT Goals (current goals can now be found in the care plan section) Progress towards PT goals: Progressing toward goals    Frequency    Min  3X/week      PT Plan Current plan remains appropriate    Co-evaluation              AM-PAC PT "6 Clicks" Mobility   Outcome Measure  Help needed turning from your back to your side while in a flat bed without using bedrails?: A Lot Help needed moving from lying on your back to sitting on the side of a flat bed without using bedrails?: A Lot Help needed moving to and from a bed to a chair (including a wheelchair)?: A Lot Help needed standing up from a chair using your arms (e.g., wheelchair or bedside chair)?: A Lot Help needed to walk in hospital room?: A Lot Help needed climbing 3-5 steps with a railing? : Total 6 Click Score: 11    End of Session Equipment Utilized During Treatment: Gait belt Activity Tolerance: Patient limited by fatigue;Patient limited by pain;Treatment limited secondary to medical complications (Comment) Patient left: with call bell/phone within reach;in bed;with bed alarm set Nurse Communication: Mobility status PT Visit Diagnosis: Other abnormalities of gait and mobility (R26.89);Muscle weakness (generalized) (M62.81);Difficulty in walking, not elsewhere classified (R26.2);History of falling (Z91.81)     Time: 1030-1055 PT Time Calculation (min) (ACUTE ONLY): 25 min  Charges:  $Therapeutic Activity: 23-37 mins                     Rica Koyanagi  PTA Acute  Rehabilitation Services Office M-F          [phone] Weekend pager [phone]

## 2022-05-17 NOTE — Discharge Summary (Signed)
Physician Discharge Summary  Shelley Flores [name]:[id number] DOB: 01-Sep-1932 DOA: 05/12/2022  PCP: [name], MD  Admit date: 05/12/2022 Discharge date: 05/17/2022  Time spent: 44 minutes  Recommendations for Outpatient Follow-up:  Chem-12 CBC in 1 week Needs dysphagia 3 diet nectar thick liquids and outpatient speech follow-up at facility Recommend outpatient follow-up with Dr. 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 21 daily since 02/20/2022 supposed to finish 6 cycles 06/2022-complicated by pneumonitis and chemo has been held based on 04/24/2022 oncology visit started on prednisone at that visit Cerebellar infarct 02/28/2022 HTN Chronic hyponatremia Prior small left pleural effusion, previous Pleurx drain 04/2020 Underlying PVC Reflux   Admit from [facility] ED 12/31 with left hip pain subsequent to fall-she was attempting to transfer between a walker and chair Found to have sodium 132 WBC 29 hemoglobin 10 05/13/2022 underwent cephalomedullary nailing of left  hip Hospitalization complicated by dysphagia aphasia    Hospital Course:  Left-sided hip fracture -Status post nailing 05/13/2022, WBAT, Lovenox inpatient, aspirin 325 on discharge to end on 2/4 --Continue oxycodone 2.5 and use this as needed for severe pain   Moderate oropharyngeal dysphagia - Seen by speech therapy and discussed with them - She was placed on dysphagia 3 diet and needs to be sitting at 30 degrees all the times should not use straw - She can have nectar thick with meals but regular liquids otherwise - Will need speech therapy help in the outpatient setting to help with this   Constipation with large stool burden -Continue Reglan MiraLAX 17-added sorbitol 30 mL daily to see if this helps - She was finally given a smog enema on 1/5 prior to discharge - She should be on   Low back pain - Low back x-rays performed 1/3 showed multilevel degenerative changes   Acute blood loss anemia likely secondary to surgery - Check hemoglobin in a.m. 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 Stage IV lung cancer -On steroid taper as per oncology outpatient follow-up - Chemo and other meds held for now   Underlying PVC -Seems to have underlying sinus tach -Continue metoprolol 12.5 twice daily   Prior CVA -Continue aspirin as above but go back to 81 mg dose after 2/4   Discharge Exam: Vitals:   05/16/22 2235 05/17/22 0536  BP: (!) 148/87 (!) 171/85  Pulse: 99 79  Resp: 15 16  Temp: (!) 96.8 F (36 C) (!) 97.3 F (36.3 C)  SpO2: 94% 97%    Subj on day of d/c   Awake coherent no distress seems comfortable but has not passed a stool Multiple other complaints including low back pain, inability to speak to her lower etc.  etc.  General Exam on discharge  EOMI NCAT no focal deficit no icterus no pallor no rales no rhonchi ROM intact moving lower extremities some no fever no chills CTAB no added sound S1-S2 no murmur no rub no gallop No lower extremity edema   Discharge Instructions   Discharge Instructions     Diet - low sodium heart healthy   Complete by: As directed    Increase activity slowly   Complete by: As directed       Allergies as of 05/17/2022       Reactions   Irbesartan Nausea Only        Medication List     STOP taking these medications    aspirin 81 MG tablet Replaced by: aspirin EC 325 MG tablet       TAKE these medications    acetaminophen 325 MG tablet Commonly known as: TYLENOL Take 2 tablets (650 mg total) by mouth every 6 (six) hours as needed for mild pain (or Fever >/= 101).   aspirin EC 325 MG tablet Take 1 tablet (325 mg total) by mouth daily. Replaces: aspirin 81 MG tablet   atorvastatin 20 MG tablet Commonly known as: LIPITOR Take 20 mg by mouth at bedtime.   folic acid 1 MG tablet Commonly known as: FOLVITE Take 1 tablet (1 mg total) by mouth daily.   iVIZIA Dry Eyes 0.5 % Soln Generic drug: Povidone (PF) Place 1 drop into both eyes daily.   lidocaine 5 % Commonly known as: LIDODERM Place 1 patch onto the skin daily. Remove & Discard patch within 12 hours or as directed by MD   magnesium hydroxide 400 MG/5ML suspension Commonly known as: MILK OF MAGNESIA Take 15 mLs by mouth daily as needed for mild constipation.   methocarbamol 500 MG tablet Commonly known as: ROBAXIN Take 1 tablet (500 mg total) by mouth every 6 (six) hours as needed for muscle spasms.   metoCLOPramide 5 MG tablet Commonly known as: REGLAN Take 5 mg by mouth 2 (two) times daily. Take it Before meals   metoprolol tartrate 25 MG tablet Commonly known as: LOPRESSOR Take 12.5 mg by mouth 2 (two) times daily.   omeprazole 20 MG capsule Commonly known as:  PRILOSEC Take 1 capsule (20 mg total) by mouth daily.   ondansetron 4 MG tablet Commonly known as: ZOFRAN Take 4 mg by mouth every 8 (eight) hours as needed for nausea or vomiting.   oxyCODONE 5 MG immediate release tablet Commonly known as: Oxy IR/ROXICODONE Take 0.5 tablets (2.5 mg total) by mouth every 4 (four) hours as needed for moderate pain.   polyethylene glycol 17 g packet Commonly known as: MIRALAX / GLYCOLAX Take 17 g by mouth daily as needed for mild constipation.   predniSONE 10 MG tablet Commonly known as: DELTASONE Take 3 tablets (30 mg total) by mouth daily with breakfast for 4 days, THEN 2 tablets (20 mg total) daily with breakfast for 4 days, THEN 1 tablet (10 mg total) daily with breakfast for 10 days. 5 tablet p.o. daily for 1 week followed by 4 tablets p.o. daily for 1 week followed by 3 tablets p.o. daily for 1 week followed by  2 tablets p.o. daily for 1 week followed by 1 tablet p.o. daily for 1 week.. Start taking on: May 17, 2022 What changed: See the new instructions.   prochlorperazine 10 MG tablet Commonly known as: COMPAZINE Take 1 tablet (10 mg total) by mouth every 6 (six) hours as needed. What changed: reasons to take this   sorbitol 70 % Soln Take 30 mLs by mouth daily.               Durable Medical Equipment  (From admission, onward)           Start     Ordered   05/13/22 1343  DME Walker rolling  Once       Question Answer Comment  Walker: With 5 Inch Wheels   Patient needs a walker to treat with the following condition Closed left hip fracture ([place])      05/13/22 1342           Allergies  Allergen Reactions   Irbesartan Nausea Only      The results of significant diagnostics from this hospitalization (including imaging, microbiology, ancillary and laboratory) are listed below for reference.    Significant Diagnostic Studies: DG Pelvis 1-2 Views  Result Date: 05/15/2022 CLINICAL DATA:  Low back pain. EXAM:  PELVIS - 1-2 VIEW COMPARISON:  Intraoperative fluoroscopic images 1124. Pelvic/left hip radiographs 05/12/2022. FINDINGS: ORIF of a left femoral neck fracture is again noted with placement of an intramedullary nail and screw terminating in the femoral head. Alignment appears near anatomic on this single projection. No new fracture is identified. The bones are diffusely osteopenic. IMPRESSION: ORIF of left femoral neck fracture. Electronically Signed   By: Logan Bores M.D.   On: 05/15/2022 16:35   DG Lumbar Spine 2-3 Views  Result Date: 05/15/2022 CLINICAL DATA:  Low back pain EXAM: LUMBAR SPINE - 2-3 VIEW COMPARISON:  CT 05/09/2022. FINDINGS: There is a mild curvature of the thoracic spine which appears convex towards the left. The vertebral body heights appear well preserved. No signs of acute fracture. Stepwise retrolisthesis of L1 on L2, L2 on L3 and L3 on L4 noted. Multilevel disc space narrowing and endplate spurring is noted. This is most advanced at L2-3. Facet arthropathy noted at L4-5 and L5-S1. Large stool burden noted within the visualized portions of the colon IMPRESSION: 1. No acute findings. 2. Multilevel degenerative disc disease and facet arthropathy. 3. Large stool burden within the visualized portions of the colon. Correlate for any clinical signs or symptoms of constipation. Electronically Signed   By: Kerby Moors M.D.   On: 05/15/2022 16:29   DG HIP UNILAT WITH PELVIS 2-3 VIEWS LEFT  Result Date: 05/13/2022 CLINICAL DATA:  Femur fracture.  Left hip IM nail. EXAM: DG HIP (WITH OR WITHOUT PELVIS) 2-3V LEFT COMPARISON:  Preoperative imaging FINDINGS: Four fluoroscopic spot views of the left hip obtained in the operating room. Intramedullary nail with trans trochanteric and distal locking screw fixation traverse intertrochanteric femur fracture. Fluoroscopy time 33 seconds. Dose 3.72 mGy. IMPRESSION: Intraoperative fluoroscopy during ORIF of left intertrochanteric femur fracture.  Electronically Signed   By: Keith Rake M.D.   On: 05/13/2022 12:22   DG C-Arm 1-60 Min-No Report  Result Date: 05/13/2022 Fluoroscopy was utilized by the requesting physician.  No radiographic interpretation.   CT Hip Left Wo Contrast  Result Date: 05/12/2022 CLINICAL DATA:  Hip trauma, left femoral neck fracture.  Fall. EXAM: CT OF THE LEFT HIP WITHOUT CONTRAST TECHNIQUE: Multidetector CT imaging  of the left hip was performed according to the standard protocol. Multiplanar CT image reconstructions were also generated. RADIATION DOSE REDUCTION: This exam was performed according to the departmental dose-optimization program which includes automated exposure control, adjustment of the mA and/or kV according to patient size and/or use of iterative reconstruction technique. COMPARISON:  Radiograph performed earlier on the same date. FINDINGS: Bones/Joint/Cartilage There is mildly comminuted intertrochanteric fracture of the left femur with superolateral displacement of the distal femur. The femoral head is located within the acetabulum. There is no femoral head dislocation. No appreciable pelvic fracture. Appreciable joint effusion. Ligaments Suboptimally assessed by CT. Muscles and Tendons Small hematoma about the fracture site. Generalized muscle atrophy. No evidence of tendon tear. Soft tissues Mild subcutaneous soft tissue edema about the lateral aspect of the hip. No fluid collection or hematoma. IMPRESSION: Mildly comminuted intertrochanteric fracture of the left femur with superolateral displacement of the distal femur. No femoral head dislocation. Electronically Signed   By: Keane Police D.O.   On: 05/12/2022 23:48   DG Hip Unilat With Pelvis 2-3 Views Left  Result Date: 05/12/2022 CLINICAL DATA:  Fall EXAM: DG HIP (WITH OR WITHOUT PELVIS) 3V LEFT COMPARISON:  None Available. FINDINGS: Intertrochanteric fracture left femoral neck with varus angulation. Evaluation of the pelvis is very limited  due to overlying bowel gas and stool. IMPRESSION: Left femoral neck fracture. Electronically Signed   By: Sammie Bench M.D.   On: 05/12/2022 22:22   DG Chest 2 View  Result Date: 05/12/2022 CLINICAL DATA:  Fractured hip EXAM: CHEST - 2 VIEW COMPARISON:  03/01/2022 FINDINGS: Evaluation of the right hemithorax is nondiagnostic due to over penetration. There is evidence of alveolar consolidation left mid to lower lung. There may be left-sided pleural effusion. No gross pneumothorax no definite pneumothorax identified. Cardiac silhouette appears within normal limits. Aorta is tortuous. IMPRESSION: 1. There is suggestion of left basilar consolidation, likely pneumonia. 2. Repeat examination should be considered due to technical limitations. Electronically Signed   By: Sammie Bench M.D.   On: 05/12/2022 22:20   CARDIAC EVENT MONITOR  Result Date: 05/09/2022 Normal sinus rhythm.  A run of PSVT at 174 bpm was noted on 11/25 at 7:07 PM (auto-detected) - this was 16 beats.  No symptoms associated with this were reported. Pixie Casino, MD, [facility], [place] Director of the [facility] Lipid [facility] Cardiovascular [facility] Diplomate of the American Board of Clinical Lipidology Attending Cardiologist Direct Dial: [phone]  Fax: [phone] Website:  [email or web address]  CT Chest W Contrast  Result Date: 04/22/2022 CLINICAL DATA:  Non-small cell lung cancer restaging, ongoing chemotherapy and oral chemotherapy, former smoker, weight loss * Tracking Code: BO * EXAM: CT CHEST, ABDOMEN, AND PELVIS WITH CONTRAST TECHNIQUE: Multidetector CT imaging of the chest, abdomen and pelvis was performed following the standard protocol during bolus administration of intravenous contrast. RADIATION DOSE REDUCTION: This exam was performed according to the departmental dose-optimization program which includes automated exposure control, adjustment of the mA and/or kV  according to patient size and/or use of iterative reconstruction technique. CONTRAST:  12mL OMNIPAQUE IOHEXOL 300 MG/ML SOLN additional oral enteric contrast COMPARISON:  01/28/2022 FINDINGS: CT CHEST FINDINGS Cardiovascular: Scattered aortic atherosclerosis. Normal heart size. Scattered left and right coronary artery calcifications. No pericardial effusion. Mediastinum/Nodes: Slightly diminished, although prominent left axillary and subpectoral lymph nodes, index node measuring 0.8 x 0.4 cm, previously 1.2 x 0.7 cm (series 2, image 16). Thyroid gland, trachea, and esophagus  demonstrate no significant findings. Lungs/Pleura: No significant change in a spiculated nodule of the medial lingula measuring 2.6 x 1.8 cm (series 4, image 69). Significant interval worsening in the very extensive heterogeneous and ground-glass opacity, confluent nodularity and pleural thickening, and interlobular septal thickening throughout the lower left lung. Slight interval worsening of interlobular septal thickening throughout the right lung base (series 4, image 95). Unchanged small nodule of the peripheral posterior right upper lobe measuring 0.4 cm (series 4, image 46). Small right pleural effusion and associated atelectasis or consolidation, increased compared to prior. Similar appearance of a loculated small left pleural effusion extensive pleural thickening. Musculoskeletal: No chest wall abnormality. No acute osseous findings. Unchanged sclerotic lesion of the left aspect of T5 (series 2, image 15). CT ABDOMEN PELVIS FINDINGS Hepatobiliary: No solid liver abnormality is seen. No gallstones, gallbladder wall thickening, or biliary dilatation. Pancreas: Unchanged subcentimeter cystic lesion of the pancreatic body (series 2, image 61). No pancreatic ductal dilatation or surrounding inflammatory changes. Spleen: Normal in size without significant abnormality. Adrenals/Urinary Tract: Adrenal glands are unremarkable. Multiple bilateral  renal cortical and parapelvic cysts, including a septated cyst of the superior pole of the left kidney. Kidneys are otherwise normal, without renal calculi, solid lesion, or hydronephrosis. Bladder is unremarkable. Stomach/Bowel: Stomach is within normal limits. Appendix is not clearly visualized and may be surgically absent. No evidence of bowel wall thickening, distention, or inflammatory changes. Vascular/Lymphatic: No significant vascular findings are present. Interval decrease in size of enlarged gastrohepatic ligament, celiac axis, and retroperitoneal lymph nodes, index left retroperitoneal node measuring 1.1 x 0.4 cm, previously 1.8 x 0.9 cm (series 2, image 66). Reproductive: Status post hysterectomy. Other: No abdominal wall hernia or abnormality. New, trace ascites in the low pelvis (series 2, image 101). Musculoskeletal: No acute osseous findings. Unchanged small sclerotic metastasis of L3 (series 5, image 88) and of the right femoral neck (series 5, image 68). IMPRESSION: 1. No significant change in a treated, spiculated nodule of the medial lingula. 2. Significant interval worsening of very extensive heterogeneous and ground-glass opacity, confluent nodularity and pleural thickening throughout the lower left lung. Slight interval worsening of interlobular septal thickening throughout the right lung base as well as an increased right pleural effusion and a small left pleural effusion with associated pleural thickening. Findings are consistent with worsened lymphangitic metastatic disease. 3. Diminished size of prominent left axillary and abdominal lymph nodes, consistent with treatment response of nodal metastatic disease. 4. Unchanged sclerotic osseous metastases. 5. New, trace ascites in the low pelvis, nonspecific although modestly suspicious for malignant ascites. No directly visualized peritoneal or omental metastatic disease. 6. Coronary artery disease. Aortic Atherosclerosis (ICD10-I70.0).  Electronically Signed   By: Delanna Ahmadi M.D.   On: 04/22/2022 06:31   CT Abdomen Pelvis W Contrast  Result Date: 04/22/2022 CLINICAL DATA:  Non-small cell lung cancer restaging, ongoing chemotherapy and oral chemotherapy, former smoker, weight loss * Tracking Code: BO * EXAM: CT CHEST, ABDOMEN, AND PELVIS WITH CONTRAST TECHNIQUE: Multidetector CT imaging of the chest, abdomen and pelvis was performed following the standard protocol during bolus administration of intravenous contrast. RADIATION DOSE REDUCTION: This exam was performed according to the departmental dose-optimization program which includes automated exposure control, adjustment of the mA and/or kV according to patient size and/or use of iterative reconstruction technique. CONTRAST:  19mL OMNIPAQUE IOHEXOL 300 MG/ML SOLN additional oral enteric contrast COMPARISON:  01/28/2022 FINDINGS: CT CHEST FINDINGS Cardiovascular: Scattered aortic atherosclerosis. Normal heart size. Scattered left and right coronary artery  calcifications. No pericardial effusion. Mediastinum/Nodes: Slightly diminished, although prominent left axillary and subpectoral lymph nodes, index node measuring 0.8 x 0.4 cm, previously 1.2 x 0.7 cm (series 2, image 16). Thyroid gland, trachea, and esophagus demonstrate no significant findings. Lungs/Pleura: No significant change in a spiculated nodule of the medial lingula measuring 2.6 x 1.8 cm (series 4, image 69). Significant interval worsening in the very extensive heterogeneous and ground-glass opacity, confluent nodularity and pleural thickening, and interlobular septal thickening throughout the lower left lung. Slight interval worsening of interlobular septal thickening throughout the right lung base (series 4, image 95). Unchanged small nodule of the peripheral posterior right upper lobe measuring 0.4 cm (series 4, image 46). Small right pleural effusion and associated atelectasis or consolidation, increased compared to prior.  Similar appearance of a loculated small left pleural effusion extensive pleural thickening. Musculoskeletal: No chest wall abnormality. No acute osseous findings. Unchanged sclerotic lesion of the left aspect of T5 (series 2, image 15). CT ABDOMEN PELVIS FINDINGS Hepatobiliary: No solid liver abnormality is seen. No gallstones, gallbladder wall thickening, or biliary dilatation. Pancreas: Unchanged subcentimeter cystic lesion of the pancreatic body (series 2, image 61). No pancreatic ductal dilatation or surrounding inflammatory changes. Spleen: Normal in size without significant abnormality. Adrenals/Urinary Tract: Adrenal glands are unremarkable. Multiple bilateral renal cortical and parapelvic cysts, including a septated cyst of the superior pole of the left kidney. Kidneys are otherwise normal, without renal calculi, solid lesion, or hydronephrosis. Bladder is unremarkable. Stomach/Bowel: Stomach is within normal limits. Appendix is not clearly visualized and may be surgically absent. No evidence of bowel wall thickening, distention, or inflammatory changes. Vascular/Lymphatic: No significant vascular findings are present. Interval decrease in size of enlarged gastrohepatic ligament, celiac axis, and retroperitoneal lymph nodes, index left retroperitoneal node measuring 1.1 x 0.4 cm, previously 1.8 x 0.9 cm (series 2, image 66). Reproductive: Status post hysterectomy. Other: No abdominal wall hernia or abnormality. New, trace ascites in the low pelvis (series 2, image 101). Musculoskeletal: No acute osseous findings. Unchanged small sclerotic metastasis of L3 (series 5, image 88) and of the right femoral neck (series 5, image 68). IMPRESSION: 1. No significant change in a treated, spiculated nodule of the medial lingula. 2. Significant interval worsening of very extensive heterogeneous and ground-glass opacity, confluent nodularity and pleural thickening throughout the lower left lung. Slight interval worsening  of interlobular septal thickening throughout the right lung base as well as an increased right pleural effusion and a small left pleural effusion with associated pleural thickening. Findings are consistent with worsened lymphangitic metastatic disease. 3. Diminished size of prominent left axillary and abdominal lymph nodes, consistent with treatment response of nodal metastatic disease. 4. Unchanged sclerotic osseous metastases. 5. New, trace ascites in the low pelvis, nonspecific although modestly suspicious for malignant ascites. No directly visualized peritoneal or omental metastatic disease. 6. Coronary artery disease. Aortic Atherosclerosis (ICD10-I70.0). Electronically Signed   By: Delanna Ahmadi M.D.   On: 04/22/2022 06:31    Microbiology: Recent Results (from the past 240 hour(s))  Surgical PCR screen     Status: Abnormal   Collection Time: 05/13/22  4:19 AM   Specimen: Nasal Mucosa; Nasal Swab  Result Value Ref Range Status   MRSA, PCR POSITIVE (A) NEGATIVE Final    Comment: RESULT CALLED TO, READ BACK BY AND VERIFIED WITH: ANGELA RN ON 05/13/22 AT 1040 BY GOLSONM    Staphylococcus aureus POSITIVE (A) NEGATIVE Final    Comment: (NOTE) The Xpert SA Assay (FDA approved for NASAL  specimens in patients 46 years of age and older), is one component of a comprehensive surveillance program. It is not intended to diagnose infection nor to guide or monitor treatment. Performed at [facility], [place] [address], [place], [place] [address]      Labs: Basic Metabolic Panel: Recent Labs  Lab 05/13/22 0338 05/13/22 1349 05/14/22 0330 05/15/22 0333 05/16/22 0324 05/17/22 0335  NA 133*  --  133* 135 135 138  K 3.9  --  4.5 4.1 3.9 3.9  CL 102  --  102 106 107 107  CO2 23  --  21* 22 22 23   GLUCOSE 143*  --  111* 114* 96 113*  BUN 23  --  27* 31* 33* 26*  CREATININE 0.73 0.42* 0.85 0.71 0.64 0.59  CALCIUM 9.3  --  9.1 8.6* 9.0 8.8*   Liver Function Tests: No results  for input(s): "AST", "ALT", "ALKPHOS", "BILITOT", "PROT", "ALBUMIN" in the last 168 hours. No results for input(s): "LIPASE", "AMYLASE" in the last 168 hours. No results for input(s): "AMMONIA" in the last 168 hours. CBC: Recent Labs  Lab 05/12/22 2253 05/12/22 2302 05/13/22 1349 05/14/22 0330 05/15/22 0333 05/16/22 0324 05/17/22 0335  WBC 29.0*   < > 23.1* 25.3* 14.3* 14.6* 9.5  NEUTROABS 27.5*  --   --   --   --   --  9.1*  HGB 10.4*   < > 8.4* 9.3* 7.1* 7.2* 10.1*  HCT 31.4*   < > 25.5* 28.9* 22.2* 22.2* 29.9*  MCV 97.2   < > 99.2 101.0* 101.8* 100.9* 94.0  PLT 154   < > 71* 108* 83* 104* 95*   < > = values in this interval not displayed.   Cardiac Enzymes: No results for input(s): "CKTOTAL", "CKMB", "CKMBINDEX", "TROPONINI" in the last 168 hours. BNP: BNP (last 3 results) No results for input(s): "BNP" in the last 8760 hours.  ProBNP (last 3 results) No results for input(s): "PROBNP" in the last 8760 hours.  CBG: No results for input(s): "GLUCAP" in the last 168 hours.     Signed:  Nita Sells MD   [facility] 05/17/2022, 10:23 AM

## 2022-05-17 NOTE — TOC Progression Note (Signed)
Transition of Care [facility]) - Progression Note    Patient Details  Name: Shelley Flores MRN: [id number] Date of Birth: 1933/04/14  Transition of Care [facility]) CM/SW Contact  Lennart Pall, LCSW Phone Number: 05/17/2022, 4:42 PM  Clinical Narrative:    Pt medically cleared today for SNF dc and insurance authorization was begun but is still pending at this time.  Facility aware and notes they ARE able to admit over the weekend if auth comes through.  The contact person for weekend admission at [facility] is Malawi Burney @ [phone].  Will ask weekend TOC to continue to check system for authorization and to connect with [facility] to coordinate admission.   MD aware.   Expected Discharge Plan: [place] Barriers to Discharge: Continued Medical Work up  Expected Discharge Plan and Services In-house Referral: Clinical Social Work     Living arrangements for the past 2 months: Materials engineer (@ [place]) Expected Discharge Date: 05/17/22               DME Arranged: N/A DME Agency: NA                   Social Determinants of Health (SDOH) Interventions SDOH Screenings   Food Insecurity: No Food Insecurity (05/13/2022)  Housing: Low Risk  (05/13/2022)  Transportation Needs: No Transportation Needs (05/13/2022)  Utilities: Not At Risk (05/13/2022)  Alcohol Screen: Low Risk  (09/10/2021)  Depression (PHQ2-9): Low Risk  (04/12/2022)  Financial Resource Strain: Low Risk  (09/10/2021)  Physical Activity: Sufficiently Active (09/10/2021)  Social Connections: Socially Isolated (09/10/2021)  Stress: No Stress Concern Present (09/10/2021)  Tobacco Use: Medium Risk (05/14/2022)    Readmission Risk Interventions    05/14/2022    2:21 PM  Readmission Risk Prevention Plan  Transportation Screening Complete  PCP or Specialist Appt within 3-5 Days Complete  HRI or [place] Complete  Social Work Consult for [place] Planning/Counseling  Complete

## 2022-05-17 NOTE — Consult Note (Signed)
   [facility] CM Inpatient Consult   05/17/2022  [name] [birth year] [id number]  [place] Organization [ACO] Patient: Shelley Flores PPO  *[facility] Liaison remote coverage review for patient admitted to [facility]    Primary Care Provider:  [name], MD with [facility].  Patient screened for hospitalization with noted extreme high risk score for unplanned readmission risk list.  Review of patient's electronic medical record reveals patient is for a skilled nursing level of care. Patient is a resident at [facility] per inpatient [facility] team notes.   Plan:  Needs to be met at a skilled nursing facility level of care:  No [facility] Care Coordination needs assessed.  Of note, [facility] Care Management/Population Health does not replace or interfere with any arrangements made by the Inpatient Transition of Care team.  For questions contact:   Natividad Brood, RN BSN [place]  [phone] business mobile phone Toll free office [phone]  *[place]  [phone] Fax number: [phone] [email or web address]

## 2022-05-17 NOTE — NC FL2 (Signed)
[place] LEVEL OF CARE FORM     IDENTIFICATION  Patient Name: Shelley Flores Birthdate: 1932/08/18 Sex: female Admission Date (Current Location): 05/12/2022  [facility] and [place] Number:  Herbalist and Address:  [facility],  [place], [place]      Provider Number: [id number]  Attending Physician Name and Address:  Nita Sells, MD  Relative Name and Phone Number:       Current Level of Care: Hospital Recommended Level of Care: [place] Prior Approval Number:    Date Approved/Denied:   PASRR Number: [id number]  Discharge Plan:      Current Diagnoses: Patient Active Problem List   Diagnosis Date Noted   Protein-calorie malnutrition, severe 05/15/2022   Drug-induced pneumonitis 05/13/2022   Closed left hip fracture, initial encounter ([place]) 05/12/2022   Anemia, chronic disease 03/08/2022   Thrombocytopenia ([place]) 03/08/2022   Orthostatic hypotension 03/04/2022   DOE (dyspnea on exertion) 03/04/2022   Tachycardia 03/04/2022   History of cerebellar stroke 02/26/2022   Chest tube in place    Candidiasis of skin 06/27/2020   Petechiae 06/27/2020   Adenocarcinoma of left lung, stage 4 ([place]) 06/07/2020   Lung cancer ([place]) 05/17/2020   Goals of care, counseling/discussion 05/17/2020   Encounter for antineoplastic chemotherapy 05/17/2020   Mass of upper lobe of left lung 05/15/2020   Edema 05/15/2020   OP (osteoporosis) 05/15/2020   GERD (gastroesophageal reflux disease) 05/15/2020   Hypophosphatemia 05/15/2020   Pleural effusion 05/01/2020   Acute hypoxemic respiratory failure ([place]) 05/01/2020   Hyponatremia 05/01/2020   Hypokalemia 05/01/2020   PVC's (premature ventricular contractions) 02/26/2016   Essential hypertension 12/29/2012   Hyperlipidemia 12/29/2012   Murmur 12/29/2012    Orientation RESPIRATION BLADDER Height & Weight     Self, Situation, Place, Time  Normal  Incontinent, External catheter (currently with Purewick) Weight: 91 lb 7.9 oz (41.5 kg) Height:  5\' 4"  (162.6 cm)  BEHAVIORAL SYMPTOMS/MOOD NEUROLOGICAL BOWEL NUTRITION STATUS      Incontinent Diet (heart healthy)  AMBULATORY STATUS COMMUNICATION OF NEEDS Skin   Extensive Assist Verbally Other (Comment) (surgical incision only)                       Personal Care Assistance Level of Assistance  Bathing, Dressing Bathing Assistance: Limited assistance   Dressing Assistance: Limited assistance     Functional Limitations Info             [place]  PT (By licensed PT), OT (By licensed OT)     PT Frequency: 5x/wk OT Frequency: 5x/wk            Contractures Contractures Info: Not present    Additional Factors Info  Code Status, Allergies Code Status Info: DNR Allergies Info: Irbesartan           Current Medications (05/17/2022):  This is the current hospital active medication list Current Facility-Administered Medications  Medication Dose Route Frequency Provider Last Rate Last Admin   0.9 %  sodium chloride infusion   Intravenous Continuous Nita Sells, MD   Paused at 05/16/22 1241   acetaminophen (OFIRMEV) IV 500 mg  500 mg Intravenous Q6H Nita Sells, MD 200 mL/hr at 05/16/22 1949 500 mg at 05/16/22 1949   acetaminophen (TYLENOL) tablet 650 mg  650 mg Oral Once Nita Sells, MD       aspirin EC tablet 81 mg  81 mg Oral Daily Aileen Fass,  Tammi Klippel, MD   81 mg at 05/15/22 0920   atorvastatin (LIPITOR) tablet 20 mg  20 mg Oral QHS [place], Jesse J, [place]   20 mg at 05/15/22 2227   diphenhydrAMINE (BENADRYL) 12.5 MG/5ML elixir 12.5-25 mg  12.5-25 mg Oral Q4H PRN [place], Jesse J, PA-C       diphenhydrAMINE (BENADRYL) capsule 25 mg  25 mg Oral Once Nita Sells, MD       enoxaparin (LOVENOX) injection 30 mg  30 mg Subcutaneous Q24H [place], Jesse J, [place]   30 mg at 05/17/22 3762   feeding supplement (ENSURE ENLIVE /  ENSURE PLUS) liquid 237 mL  237 mL Oral BID BM Charlynne Cousins, MD   237 mL at 05/15/22 1437   lidocaine (LIDODERM) 5 % 1 patch  1 patch Transdermal Q24H Charlynne Cousins, MD   1 patch at 05/16/22 1949   methocarbamol (ROBAXIN) tablet 500 mg  500 mg Oral Q6H PRN [place], Jesse J, PA-C   500 mg at 05/15/22 2228   Or   methocarbamol (ROBAXIN) 500 mg in dextrose 5 % 50 mL IVPB  500 mg Intravenous Q6H PRN [place], Jesse J, PA-C       metoCLOPramide (REGLAN) tablet 5-10 mg  5-10 mg Oral Q8H PRN [place], Jesse J, PA-C       Or   metoCLOPramide (REGLAN) injection 5-10 mg  5-10 mg Intravenous Q8H PRN [place], Jesse J, PA-C       metoprolol tartrate (LOPRESSOR) injection 5 mg  5 mg Intravenous Q6H PRN Charlynne Cousins, MD   5 mg at 05/14/22 1157   metoprolol tartrate (LOPRESSOR) tablet 12.5 mg  12.5 mg Oral BID Charlynne Cousins, MD   12.5 mg at 05/16/22 2215   multivitamin with minerals tablet 1 tablet  1 tablet Oral Daily Charlynne Cousins, MD   1 tablet at 05/15/22 0920   mupirocin ointment (BACTROBAN) 2 % 1 Application  1 Application Nasal BID [place], Jesse J, PA-C   1 Application at 83/15/17 2218   ondansetron (ZOFRAN) tablet 4 mg  4 mg Oral Q6H PRN [place], Jesse J, PA-C       Or   ondansetron [facility]) injection 4 mg  4 mg Intravenous Q6H PRN [place], Jesse J, PA-C       oxyCODONE (Oxy IR/ROXICODONE) immediate release tablet 2.5 mg  2.5 mg Oral Q4H PRN Nita Sells, MD       polyethylene glycol (MIRALAX / GLYCOLAX) packet 17 g  17 g Oral Daily PRN [place], Jesse J, PA-C       predniSONE (DELTASONE) tablet 30 mg  30 mg Oral Q breakfast [place]ique, Jesse J, PA-C   30 mg at 05/17/22 0831   sorbitol 70 % solution 30 mL  30 mL Oral Daily Nita Sells, MD   30 mL at 05/16/22 1948   sorbitol, milk of mag, mineral oil, glycerin (SMOG) enema  960 mL Rectal Once Nita Sells, MD         Discharge Medications: Please see discharge summary for a list of discharge  medications.  Relevant Imaging Results:  Relevant Lab Results:   Additional Information [id number]  Lennart Pall, LCSW

## 2022-05-17 NOTE — Progress Notes (Signed)
Physical Therapy Treatment Patient Details Name: DION PARROW MRN: [id number] DOB: 1933/04/25 Today's Date: 05/17/2022   History of Present Illness Patient is a 89 year old female who presented to the hosptial after a fall that resulted in L hip pain. Patient was found to have a left femoral neck fracture. Patient underwent IM nail surgery on 1/1. PMH: chemotherapy, stage IV lung cancer, small embolic cerebellar infarct.    PT Comments    POD# 4pm session Pt tolerated OOB in recliner from 10:45 am till 2:00 pm  Biggest c/o "sacral pain".  Recliner had to be near fully reclined before pt was comfortable.  Assisted back to bed was difficult.  General transfer comment: "Bear Hug" transfer from recliner to bed Max Assist.  Pt was unable to self support EOB and presented with posterior LOB.  "Hold my head up".  Pt profoundly weak.  Positioned to comfort.   Pt will need ST Rehab at SNF to address mobility and functional decline prior to safely returning home.   Recommendations for follow up therapy are one component of a multi-disciplinary discharge planning process, led by the attending physician.  Recommendations may be updated based on patient status, additional functional criteria and insurance authorization.  Follow Up Recommendations        Assistance Recommended at Discharge    Patient can return home with the following     Equipment Recommendations       Recommendations for Other Services       Precautions / Restrictions       Mobility  Bed Mobility Overal bed mobility: Needs Assistance Bed Mobility: Sit to Supine     Supine to sit: Max assist, Total assist Sit to supine: Max assist, Total assist   General bed mobility comments: Max/Total Asisst back to bed    Transfers Overall transfer level: Needs assistance Equipment used: Rolling walker (2 wheels) Transfers: Sit to/from Stand             General transfer comment: "Bear Hug" transfer from recliner to  bed Max Assist.    Ambulation/Gait               General Gait Details: Transfer only this session as pt was unable to safely pivot/unable to weight shift onto L LE due to pain.   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        Pertinent Vitals/Pain Pain Assessment Pain Assessment: 0-10 Pain Score: 7  Pain Location: L hip Pain Descriptors / Indicators: Aching, Grimacing, Operative site guarding Pain Intervention(s): Monitored during session, Premedicated before session, Repositioned, Ice applied    Home Living  Prior Function            PT Goals (current goals can now be found in the care plan section) Progress towards PT goals: Progressing toward goals    Frequency           PT Plan      Co-evaluation              AM-PAC PT "6 Clicks" Mobility   Outcome Measure                   End of Session Equipment Utilized During Treatment: Gait belt   Patient left: in bed;with bed alarm set         Time: 1354-1412 PT Time Calculation (min) (ACUTE ONLY): 18 min  Charges:  $Therapeutic Activity: 8-22 mins                     Rica Koyanagi  PTA [place] Office M-F          [phone] Weekend pager [phone]

## 2022-05-17 NOTE — Plan of Care (Signed)
Problem: Clinical Measurements: Goal: Ability to maintain clinical measurements within normal limits will improve Outcome: Progressing   Problem: Coping: Goal: Level of anxiety will decrease Outcome: Progressing   Problem: Elimination: Goal: Will not experience complications related to bowel motility Outcome: [place], RN 05/17/22 9:01 AM

## 2022-05-17 NOTE — Progress Notes (Signed)
7am-7pm Patient took in bites of jello and apple sauce this shift with staff assist. She also had good BM results following enema. Ivan Anchors, RN 05/17/22 7:33 PM

## 2022-05-18 DIAGNOSIS — S72002A Fracture of unspecified part of neck of left femur, initial encounter for closed fracture: Secondary | ICD-10-CM | POA: Diagnosis not present

## 2022-05-18 NOTE — Progress Notes (Signed)
PROGRESS NOTE   Shelley Flores  EHM:[id number] DOB: [birth year] DOA: 05/12/2022 PCP: [name], MD  Brief Narrative:  89 year old white female Known stage IV lung cancer on chemo (pemetrexed + carboplatin + Immunother q. 21 daily since 02/20/2022 supposed to finish 6 cycles 06/2022-complicated by pneumonitis and chemo has been held based on 04/24/2022 oncology visit started on prednisone at that visit Cerebellar infarct 02/28/2022 HTN Chronic hyponatremia Prior small left pleural effusion, previous Pleurx drain 04/2020 Underlying PVC Reflux  Admit from [facility] ED 12/31 with left hip pain subsequent to fall-she was attempting to transfer between a walker and chair Found to have sodium 132 WBC 29 hemoglobin 10 05/13/2022 underwent cephalomedullary nailing of left hip Hospitalization complicated by dysphagia aphasia  Hospital-Problem based course  Left-sided hip fracture -Status post nailing 05/13/2022, WBAT, Lovenox inpatient, aspirin 325 on discharge --Continue oxycodone 2.5-5 every 4 as needed, pain is controlled  Moderate oropharyngeal dysphagia - improved now on dys 3 diet, needs superivision - need OP SLP input on d/c to SNF  Constipation with large stool burden -Continue MiraLAX 17 - Sorbitol added with good effect  Low back pain - Low back x-rays performed 1/3 showed multilevel degenerative changes  Acute blood loss anemia likely secondary to surgery - Check hemoglobin in a.m. 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 Stage IV lung cancer -On steroid taper as per oncology outpatient follow-up - Chemo and other meds held for now  Underlying PVC -Seems to have underlying sinus tach -Continue metoprolol 12.5 twice daily  Prior CVA -Continue aspirin  DVT prophylaxis: Lovenox Code Status: DNR Family Communication: None Disposition:  Status is:  Inpatient Remains inpatient appropriate because:   Awaiting skilled bed     Subjective:  Better No fever chills Good stool earlier today No CP Hip pain controlled  Objective: Vitals:   05/17/22 0536 05/17/22 1315 05/17/22 2212 05/18/22 0533  BP: (!) 171/85 (!) 174/90 (!) 140/69 (!) 142/74  Pulse: 79 78 78 95  Resp: 16 20 16 16   Temp: (!) 97.3 F (36.3 C) (!) 97.4 F (36.3 C) (!) 97.5 F (36.4 C) (!) 97.5 F (36.4 C)  TempSrc:      SpO2: 97% 96% 97% 98%  Weight:      Height:        Intake/Output Summary (Last 24 hours) at 05/18/2022 1304 Last data filed at 05/18/2022 0913 Gross per 24 hour  Intake 240 ml  Output 200 ml  Net 40 ml    Filed Weights   05/12/22 2154 05/13/22 0248 05/13/22 1056  Weight: 45.4 kg 41.5 kg 41.5 kg    Examination:  EOMI NCAT frail cachectic white female  no icterus chest clear no rales rhonchi or wheeze ROM intact -slight tender L side Neurologically is intact  Data Reviewed: personally reviewed   CBC    Component Value Date/Time   WBC 9.5 05/17/2022 0335   RBC 3.18 (L) 05/17/2022 0335   HGB 10.1 (L) 05/17/2022 0335   HGB 9.3 (L) 04/24/2022 1018   HCT 29.9 (L) 05/17/2022 0335   HCT 35.1 05/13/2022 1323   PLT 95 (L) 05/17/2022 0335   PLT 348 04/24/2022 1018   MCV 94.0 05/17/2022 0335   MCH 31.8 05/17/2022 0335   MCHC 33.8 05/17/2022 0335   RDW 21.2 (H) 05/17/2022 0335   LYMPHSABS 0.1 (L) 05/17/2022 0335   MONOABS 0.1 05/17/2022 0335  EOSABS 0.0 05/17/2022 0335   BASOSABS 0.0 05/17/2022 0335      Latest Ref Rng & Units 05/17/2022    3:35 AM 05/16/2022    3:24 AM 05/15/2022    3:33 AM  CMP  Glucose 70 - 99 mg/dL 113  96  114   BUN 8 - 23 mg/dL 26  33  31   Creatinine 0.44 - 1.00 mg/dL 0.59  0.64  0.71   Sodium 135 - 145 mmol/L 138  135  135   Potassium 3.5 - 5.1 mmol/L 3.9  3.9  4.1   Chloride 98 - 111 mmol/L 107  107  106   CO2 22 - 32 mmol/L 23  22  22    Calcium 8.9 - 10.3 mg/dL 8.8  9.0  8.6      Radiology  Studies: No results found.   Scheduled Meds:  acetaminophen  650 mg Oral Once   aspirin EC  81 mg Oral Daily   atorvastatin  20 mg Oral QHS   diphenhydrAMINE  25 mg Oral Once   enoxaparin (LOVENOX) injection  30 mg Subcutaneous Q24H   feeding supplement  237 mL Oral BID BM   lidocaine  1 patch Transdermal Q24H   metoprolol tartrate  12.5 mg Oral BID   multivitamin with minerals  1 tablet Oral Daily   predniSONE  30 mg Oral Q breakfast   sorbitol  30 mL Oral Daily   Continuous Infusions:  methocarbamol (ROBAXIN) IV       LOS: 6 days   Time spent: Goodell, MD [facility] To contact the attending provider between 7A-7P or the covering provider during after hours 7P-7A, please log into the web site [email or web address] and access using universal [place] password for that web site. If you do not have the password, please call the hospital operator.  05/18/2022, 1:04 PM

## 2022-05-19 NOTE — TOC Progression Note (Signed)
Transition of Care [facility]) - Progression Note    Patient Details  Name: Shelley Flores MRN: [id number] Date of Birth: [birth year]  [facility] of [facility]) CM/SW Contact  Henrietta Dine, RN Phone Number: 05/19/2022, 12:04 PM  Clinical Narrative:    Ins auth still pending per [employer].   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[place]) Expected Discharge Date: 05/17/22               DME Arranged: N/A DME Agency: NA                   Social Determinants of Health (SDOH) Interventions SDOH Screenings   Food Insecurity: No Food Insecurity (05/13/2022)  Housing: Low Risk  (05/13/2022)  Transportation Needs: No Transportation Needs (05/13/2022)  Utilities: Not At Risk (05/13/2022)  Alcohol Screen: Low Risk  (09/10/2021)  Depression (PHQ2-9): Low Risk  (04/12/2022)  Financial Resource Strain: Low Risk  (09/10/2021)  Physical Activity: Sufficiently Active (09/10/2021)  Social Connections: Socially Isolated (09/10/2021)  Stress: No Stress Concern Present (09/10/2021)  Tobacco Use: Medium Risk (05/14/2022)    Readmission Risk Interventions    05/14/2022    2:21 PM  Readmission Risk Prevention Plan  Transportation Screening Complete  PCP or Specialist Appt within 3-5 Days Complete  HRI or [place] Complete  Social Work Consult for [place] Planning/Counseling Complete

## 2022-05-19 NOTE — Progress Notes (Signed)
PT Cancellation Note  Patient Details Name: Shelley Flores MRN: [id number] DOB: May 10, 1933   Cancelled Treatment:    Reason Eval/Treat Not Completed: Other (comment). Pt declines, adamant that she wants to eat her oatmeal and does not want to sit up d/t pain   [facility] 05/19/2022, 11:10 AM

## 2022-05-19 NOTE — Plan of Care (Signed)
  Problem: Education: Goal: Knowledge of General Education information will improve Description: Including pain rating scale, medication(s)/side effects and non-pharmacologic comfort measures Outcome: Progressing   Problem: Activity: Goal: Risk for activity intolerance will decrease Outcome: Progressing   Problem: Pain Managment: Goal: General experience of comfort will improve Outcome: Progressing   

## 2022-05-19 NOTE — Progress Notes (Signed)
No new changes SNF when available  Verneita Griffes, MD [facility] 3:30 PM

## 2022-05-20 ENCOUNTER — Encounter (HOSPITAL_COMMUNITY): Payer: Self-pay | Admitting: Family Medicine

## 2022-05-20 DIAGNOSIS — J984 Other disorders of lung: Secondary | ICD-10-CM | POA: Diagnosis not present

## 2022-05-20 DIAGNOSIS — Z7401 Bed confinement status: Secondary | ICD-10-CM | POA: Diagnosis not present

## 2022-05-20 DIAGNOSIS — I951 Orthostatic hypotension: Secondary | ICD-10-CM | POA: Diagnosis not present

## 2022-05-20 DIAGNOSIS — C3492 Malignant neoplasm of unspecified part of left bronchus or lung: Secondary | ICD-10-CM | POA: Diagnosis not present

## 2022-05-20 DIAGNOSIS — S72002A Fracture of unspecified part of neck of left femur, initial encounter for closed fracture: Secondary | ICD-10-CM | POA: Diagnosis not present

## 2022-05-20 DIAGNOSIS — R404 Transient alteration of awareness: Secondary | ICD-10-CM | POA: Diagnosis not present

## 2022-05-20 DIAGNOSIS — R1310 Dysphagia, unspecified: Secondary | ICD-10-CM | POA: Diagnosis not present

## 2022-05-20 DIAGNOSIS — L89152 Pressure ulcer of sacral region, stage 2: Secondary | ICD-10-CM | POA: Diagnosis not present

## 2022-05-20 DIAGNOSIS — T50905D Adverse effect of unspecified drugs, medicaments and biological substances, subsequent encounter: Secondary | ICD-10-CM | POA: Diagnosis not present

## 2022-05-20 DIAGNOSIS — S72002D Fracture of unspecified part of neck of left femur, subsequent encounter for closed fracture with routine healing: Secondary | ICD-10-CM | POA: Diagnosis not present

## 2022-05-20 DIAGNOSIS — I10 Essential (primary) hypertension: Secondary | ICD-10-CM | POA: Diagnosis not present

## 2022-05-20 DIAGNOSIS — E871 Hypo-osmolality and hyponatremia: Secondary | ICD-10-CM | POA: Diagnosis not present

## 2022-05-20 DIAGNOSIS — D638 Anemia in other chronic diseases classified elsewhere: Secondary | ICD-10-CM | POA: Diagnosis not present

## 2022-05-20 DIAGNOSIS — R29898 Other symptoms and signs involving the musculoskeletal system: Secondary | ICD-10-CM | POA: Diagnosis not present

## 2022-05-20 DIAGNOSIS — I639 Cerebral infarction, unspecified: Secondary | ICD-10-CM | POA: Diagnosis not present

## 2022-05-20 DIAGNOSIS — J9601 Acute respiratory failure with hypoxia: Secondary | ICD-10-CM | POA: Diagnosis not present

## 2022-05-20 DIAGNOSIS — K219 Gastro-esophageal reflux disease without esophagitis: Secondary | ICD-10-CM | POA: Diagnosis not present

## 2022-05-20 DIAGNOSIS — C3412 Malignant neoplasm of upper lobe, left bronchus or lung: Secondary | ICD-10-CM | POA: Diagnosis not present

## 2022-05-20 DIAGNOSIS — M1652 Unilateral post-traumatic osteoarthritis, left hip: Secondary | ICD-10-CM | POA: Diagnosis not present

## 2022-05-20 DIAGNOSIS — T50905A Adverse effect of unspecified drugs, medicaments and biological substances, initial encounter: Secondary | ICD-10-CM | POA: Diagnosis not present

## 2022-05-20 DIAGNOSIS — R000 Tachycardia, unspecified: Secondary | ICD-10-CM | POA: Diagnosis not present

## 2022-05-20 DIAGNOSIS — E43 Unspecified severe protein-calorie malnutrition: Secondary | ICD-10-CM | POA: Diagnosis not present

## 2022-05-20 DIAGNOSIS — R262 Difficulty in walking, not elsewhere classified: Secondary | ICD-10-CM | POA: Diagnosis not present

## 2022-05-20 NOTE — Discharge Summary (Signed)
Physician Discharge Summary  Shelley Flores GUR:[id number] DOB: February 17, 1933 DOA: 05/12/2022  PCP: [name], MD  [facility] and summary ammended at day of discharge  Admit date: 05/12/2022 Discharge date: 05/20/2022  Time spent: 44 minutes  Recommendations for Outpatient Follow-up:  Chem-12 CBC in 1 week Needs dysphagia 3 diet nectar thick liquids and outpatient speech follow-up at facility Recommend outpatient follow-up with Dr. 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 21 daily since 02/20/2022 supposed to finish 6 cycles 06/2022-complicated by pneumonitis and chemo has been held based on 04/24/2022 oncology visit started on prednisone at that visit Cerebellar infarct 02/28/2022 HTN Chronic hyponatremia Prior small left pleural effusion, previous Pleurx drain 04/2020 Underlying PVC Reflux   Admit from [facility] ED 12/31 with left hip pain subsequent to fall-she was attempting to transfer between a walker and chair Found to have sodium 132 WBC 29 hemoglobin  10 05/13/2022 underwent cephalomedullary nailing of left hip Hospitalization complicated by dysphagia aphasia    Hospital Course:  Left-sided hip fracture -Status post nailing 05/13/2022, WBAT, Lovenox inpatient, aspirin 325 on discharge to end on 2/4 --Continue oxycodone 2.5 and use this as needed for severe pain   Moderate oropharyngeal dysphagia - Seen by speech therapy and discussed with them - She was placed on dysphagia 3 diet and needs to be sitting at 30 degrees all the times should not use straw - She can have nectar thick with meals but regular liquids otherwise - Will need speech therapy help in the outpatient setting to help with this   Constipation with large stool burden -Continue Reglan MiraLAX 17-added sorbitol 30 mL daily to see if this helps - She was finally given a smog enema on 1/5 prior to discharge - She was stable and voiding well on regimen   Low back pain - Low back x-rays performed 1/3 showed multilevel degenerative changes   Acute blood loss anemia likely secondary to surgery - Check hemoglobin in a.m. 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 Stage IV lung cancer -On steroid taper as per oncology outpatient follow-up - Chemo and other meds held for now   Underlying PVC -Seems to have underlying sinus tach -Continue metoprolol 12.5 twice daily   Prior CVA -Continue aspirin as above but go back to 81 mg dose after 2/4   Discharge Exam: Vitals:   05/19/22 2122 05/20/22 0451  BP: (!) 146/68 (!) 155/71  Pulse: 95 75  Resp: 16 18  Temp: 97.6 F (36.4 C) 98.3 F (36.8 C)  SpO2: 95% 97%    Subj on day of d/c   Awake coherent no distress seems comfortable=  General Exam on discharge  EOMI  NCAT no focal deficit no icterus  ROM intact moving lower extremities some no fever no chills CTAB no added sound S1-S2 no murmur no rub no gallop    Discharge Instructions   Discharge Instructions     Diet - low sodium heart healthy   Complete by: As directed    Diet - low sodium heart healthy   Complete by: As directed    Increase activity slowly   Complete by: As directed    Increase activity slowly   Complete by: As directed       Allergies as of 05/20/2022       Reactions   Irbesartan Nausea Only        Medication List     STOP taking these medications    aspirin 81 MG tablet Replaced by: aspirin EC 325 MG tablet       TAKE these medications    acetaminophen 325 MG tablet Commonly known as: TYLENOL Take 2 tablets (650 mg total) by mouth every 6 (six) hours as needed for mild pain (or Fever >/= 101).   aspirin EC 325 MG tablet Take 1 tablet (325 mg total) by mouth daily. Replaces: aspirin 81 MG tablet   atorvastatin 20 MG tablet Commonly known as: LIPITOR Take 20 mg by mouth at bedtime.   folic acid 1 MG tablet Commonly known as: FOLVITE Take 1 tablet (1 mg total) by mouth daily.   iVIZIA Dry Eyes 0.5 % Soln Generic drug: Povidone (PF) Place 1 drop into both eyes daily.   lidocaine 5 % Commonly known as: LIDODERM Place 1 patch onto the skin daily. Remove & Discard patch within 12 hours or as directed by MD   magnesium hydroxide 400 MG/5ML suspension Commonly known as: MILK OF MAGNESIA Take 15 mLs by mouth daily as needed for mild constipation.   methocarbamol 500 MG tablet Commonly known as: ROBAXIN Take 1 tablet (500 mg total) by mouth every 6 (six) hours as needed for muscle spasms.   metoCLOPramide 5 MG tablet Commonly known as: REGLAN Take 5 mg by mouth 2 (two) times daily. Take it Before meals   metoprolol tartrate 25 MG tablet Commonly known as: LOPRESSOR Take 12.5 mg by mouth 2 (two) times daily.   omeprazole 20 MG  capsule Commonly known as: PRILOSEC Take 1 capsule (20 mg total) by mouth daily.   ondansetron 4 MG tablet Commonly known as: ZOFRAN Take 4 mg by mouth every 8 (eight) hours as needed for nausea or vomiting.   oxyCODONE 5 MG immediate release tablet Commonly known as: Oxy IR/ROXICODONE Take 0.5 tablets (2.5 mg total) by mouth every 4 (four) hours as needed for moderate pain.   polyethylene glycol 17 g packet Commonly known as: MIRALAX / GLYCOLAX Take 17 g by mouth daily as needed for mild constipation.   predniSONE 10 MG tablet Commonly known as: DELTASONE Take 3 tablets (30 mg total) by mouth daily with breakfast for 4 days, THEN 2 tablets (20 mg total) daily with breakfast for 4 days, THEN 1 tablet (10 mg total) daily with breakfast for 10 days. 5 tablet p.o. daily for 1 week followed by 4 tablets p.o. daily for 1 week followed  by 3 tablets p.o. daily for 1 week followed by 2 tablets p.o. daily for 1 week followed by 1 tablet p.o. daily for 1 week.. Start taking on: May 17, 2022 What changed: See the new instructions.   prochlorperazine 10 MG tablet Commonly known as: COMPAZINE Take 1 tablet (10 mg total) by mouth every 6 (six) hours as needed. What changed: reasons to take this   sorbitol 70 % Soln Take 30 mLs by mouth daily.               Durable Medical Equipment  (From admission, onward)           Start     Ordered   05/13/22 1343  DME Walker rolling  Once       Question Answer Comment  Walker: With 5 Inch Wheels   Patient needs a walker to treat with the following condition Closed left hip fracture ([place])      05/13/22 1342           Allergies  Allergen Reactions   Irbesartan Nausea Only    Contact information for after-discharge care     Destination     [facility] .   Service: Skilled Nursing Contact information: [place] [phone]                       The results of significant diagnostics from this hospitalization (including imaging, microbiology, ancillary and laboratory) are listed below for reference.    Significant Diagnostic Studies: DG Pelvis 1-2 Views  Result Date: 05/15/2022 CLINICAL DATA:  Low back pain. EXAM: PELVIS - 1-2 VIEW COMPARISON:  Intraoperative fluoroscopic images 1124. Pelvic/left hip radiographs 05/12/2022. FINDINGS: ORIF of a left femoral neck fracture is again noted with placement of an intramedullary nail and screw terminating in the femoral head. Alignment appears near anatomic on this single projection. No new fracture is identified. The bones are diffusely osteopenic. IMPRESSION: ORIF of left femoral neck fracture. Electronically Signed   By: Logan Bores M.D.   On: 05/15/2022 16:35   DG Lumbar Spine 2-3 Views  Result Date: 05/15/2022 CLINICAL DATA:  Low back pain EXAM: LUMBAR SPINE - 2-3 VIEW COMPARISON:  CT 05/09/2022. FINDINGS: There is a mild curvature of the thoracic spine which appears convex towards the left. The vertebral body heights appear well preserved. No signs of acute fracture. Stepwise retrolisthesis of L1 on L2, L2 on L3 and L3 on L4 noted. Multilevel disc space narrowing and endplate spurring is noted. This is most advanced at L2-3. Facet arthropathy noted at L4-5 and L5-S1. Large stool burden noted within the visualized portions of the colon IMPRESSION: 1. No acute findings. 2. Multilevel degenerative disc disease and facet arthropathy. 3. Large stool burden within the visualized portions of the colon. Correlate for any clinical signs or symptoms of constipation. Electronically Signed   By: Kerby Moors M.D.   On: 05/15/2022 16:29   DG HIP UNILAT WITH PELVIS 2-3 VIEWS LEFT  Result Date: 05/13/2022 CLINICAL DATA:  Femur fracture.  Left hip IM nail. EXAM: DG HIP (WITH OR WITHOUT PELVIS) 2-3V LEFT COMPARISON:  Preoperative imaging FINDINGS: Four fluoroscopic spot views of the left hip obtained in  the operating room. Intramedullary nail with trans trochanteric and distal locking screw fixation traverse intertrochanteric femur fracture. Fluoroscopy time 33 seconds. Dose 3.72 mGy. IMPRESSION: Intraoperative fluoroscopy during ORIF of left intertrochanteric femur fracture. Electronically Signed   By: Threasa Beards  [place] M.D.   On: 05/13/2022 12:22   DG C-Arm 1-60 Min-No Report  Result Date: 05/13/2022 Fluoroscopy was utilized by the requesting physician.  No radiographic interpretation.   CT Hip Left Wo Contrast  Result Date: 05/12/2022 CLINICAL DATA:  Hip trauma, left femoral neck fracture.  Fall. EXAM: CT OF THE LEFT HIP WITHOUT CONTRAST TECHNIQUE: Multidetector CT imaging of the left hip was performed according to the standard protocol. Multiplanar CT image reconstructions were also generated. RADIATION DOSE REDUCTION: This exam was performed according to the departmental dose-optimization program which includes automated exposure control, adjustment of the mA and/or kV according to patient size and/or use of iterative reconstruction technique. COMPARISON:  Radiograph performed earlier on the same date. FINDINGS: Bones/Joint/Cartilage There is mildly comminuted intertrochanteric fracture of the left femur with superolateral displacement of the distal femur. The femoral head is located within the acetabulum. There is no femoral head dislocation. No appreciable pelvic fracture. Appreciable joint effusion. Ligaments Suboptimally assessed by CT. Muscles and Tendons Small hematoma about the fracture site. Generalized muscle atrophy. No evidence of tendon tear. Soft tissues Mild subcutaneous soft tissue edema about the lateral aspect of the hip. No fluid collection or hematoma. IMPRESSION: Mildly comminuted intertrochanteric fracture of the left femur with superolateral displacement of the distal femur. No femoral head dislocation. Electronically Signed   By: Keane Police D.O.   On: 05/12/2022 23:48   DG Hip  Unilat With Pelvis 2-3 Views Left  Result Date: 05/12/2022 CLINICAL DATA:  Fall EXAM: DG HIP (WITH OR WITHOUT PELVIS) 3V LEFT COMPARISON:  None Available. FINDINGS: Intertrochanteric fracture left femoral neck with varus angulation. Evaluation of the pelvis is very limited due to overlying bowel gas and stool. IMPRESSION: Left femoral neck fracture. Electronically Signed   By: Sammie Bench M.D.   On: 05/12/2022 22:22   DG Chest 2 View  Result Date: 05/12/2022 CLINICAL DATA:  Fractured hip EXAM: CHEST - 2 VIEW COMPARISON:  03/01/2022 FINDINGS: Evaluation of the right hemithorax is nondiagnostic due to over penetration. There is evidence of alveolar consolidation left mid to lower lung. There may be left-sided pleural effusion. No gross pneumothorax no definite pneumothorax identified. Cardiac silhouette appears within normal limits. Aorta is tortuous. IMPRESSION: 1. There is suggestion of left basilar consolidation, likely pneumonia. 2. Repeat examination should be considered due to technical limitations. Electronically Signed   By: Sammie Bench M.D.   On: 05/12/2022 22:20   CARDIAC EVENT MONITOR  Result Date: 05/09/2022 Normal sinus rhythm.  A run of PSVT at 174 bpm was noted on 11/25 at 7:07 PM (auto-detected) - this was 16 beats.  No symptoms associated with this were reported. Pixie Casino, MD, [facility], [place] Director of the Advanced Lipid Disorders & Cardiovascular Risk Reduction Clinic Diplomate of the American Board of Clinical Lipidology Attending Cardiologist Direct Dial: [phone]  Fax: [phone] Website:  [email or web address]   Microbiology: Recent Results (from the past 240 hour(s))  Surgical PCR screen     Status: Abnormal   Collection Time: 05/13/22  4:19 AM   Specimen: Nasal Mucosa; Nasal Swab  Result Value Ref Range Status   MRSA, PCR POSITIVE (A) NEGATIVE Final    Comment: RESULT CALLED TO, READ BACK BY AND VERIFIED WITH: ANGELA  RN ON 05/13/22 AT 1040 BY GOLSONM    Staphylococcus aureus POSITIVE (A) NEGATIVE Final    Comment: (NOTE) The Xpert SA Assay (FDA approved for NASAL specimens in patients 11 years of age and  older), is one component of a comprehensive surveillance program. It is not intended to diagnose infection nor to guide or monitor treatment. Performed at [facility] [place] [address], [place], [place] [address]      Labs: Basic Metabolic Panel: Recent Labs  Lab 05/13/22 1349 05/14/22 0330 05/15/22 0333 05/16/22 0324 05/17/22 0335  NA  --  133* 135 135 138  K  --  4.5 4.1 3.9 3.9  CL  --  102 106 107 107  CO2  --  21* 22 22 23   GLUCOSE  --  111* 114* 96 113*  BUN  --  27* 31* 33* 26*  CREATININE 0.42* 0.85 0.71 0.64 0.59  CALCIUM  --  9.1 8.6* 9.0 8.8*    Liver Function Tests: No results for input(s): "AST", "ALT", "ALKPHOS", "BILITOT", "PROT", "ALBUMIN" in the last 168 hours. No results for input(s): "LIPASE", "AMYLASE" in the last 168 hours. No results for input(s): "AMMONIA" in the last 168 hours. CBC: Recent Labs  Lab 05/13/22 1349 05/14/22 0330 05/15/22 0333 05/16/22 0324 05/17/22 0335  WBC 23.1* 25.3* 14.3* 14.6* 9.5  NEUTROABS  --   --   --   --  9.1*  HGB 8.4* 9.3* 7.1* 7.2* 10.1*  HCT 25.5* 28.9* 22.2* 22.2* 29.9*  MCV 99.2 101.0* 101.8* 100.9* 94.0  PLT 71* 108* 83* 104* 95*    Cardiac Enzymes: No results for input(s): "CKTOTAL", "CKMB", "CKMBINDEX", "TROPONINI" in the last 168 hours. BNP: BNP (last 3 results) No results for input(s): "BNP" in the last 8760 hours.  ProBNP (last 3 results) No results for input(s): "PROBNP" in the last 8760 hours.  CBG: No results for input(s): "GLUCAP" in the last 168 hours.     Signed:  Nita Sells MD   [facility] 05/20/2022, 1:25 PM

## 2022-05-20 NOTE — Progress Notes (Signed)
Speech Language Pathology Treatment: Dysphagia  Patient Details Name: Shelley Flores MRN: [id number] DOB: 22-Feb-1933 Today's Date: 05/20/2022 Time: 6734-1937 SLP Time Calculation (min) (ACUTE ONLY): 25 min  Assessment / Plan / Recommendation Clinical Impression  Patient seen by SLP for skilled treatment focused on dysphagia goals. Patient was awake, alert, wanting some PO's when SLP arrived. She reported that she has not had good PO intake since cancer treatments due in large part to taste changing (foods/liquids having bitter taste). SLP assisted patient with positioning in bed with her directing (having 6/10 pain in lower back). She was able to tolerate HOB elevated at 35 degrees initially but towards end of session, requested this be decreased. (Ended up at 28 degrees). SLP assessed her toleration of following PO's: puree solids (oatmeal), nectar thick juice and thin water. With nectar thick liquids she exhibited audible swallow, mild intensity dry sounding cough, multiple swallows and complaint that nectar thick liquids were a little more difficult to manage. With thin liquids, she exhibited audible swallows and more frequent but still mild intensity of dry sounding cough. She felt that the water helped her mouth and throat feel better. No significant difficulties with puree solids (oatmeal). After discussion with patient, she was in agreement to cease the use of thickened liquids at this time but SLP will continue to follow for toleration. Although she is having a slight increase in coughing with thin liquids, she is at a high risk of malnutrition/dehydration and thin liquids, specifically water will be more beneficial than nectar thick liquids for this.    HPI HPI: Patient is seen for swallow eval per order.  Pt with PMH + for lung cancer, has undergone pill chemo treatment - and reports recently stopped chemo due to concerns for inflammation- she was put on a steroid taper.  She was admitted after  fall and found to have hip fx, s/p surgery.  Swallow evaluation ordered as pt reported being "scared to swallow" and "choking on water".  Pt admits her intake was poor prior to admission due to gustatory changes associated with chemo treatment. , she has a small bowel movement every few days at home prior to admit. Today pt was lethargic earlier and not adequately alert for swallow eval or po intake.  She is s/p blood transfusion, which per RN has improved her strength.  Positoning is suboptimal for pt due to her pain on her bottom *RN reports blanced spot* and back pain - she only allows HOB elevated approx 35* MAX. Marland KitchenShe does tell me she has had a history of bowel issues.  She is not eating or drinking much at all during this hospital stay.  Pt endorses occasional sensation of food or drink slowly clearing proximal esophagus - again stating this is new.  Question if pt may have presbyesophagus that is exacerbated with current illness.  She denies reflux.      SLP Plan  Continue with current plan of care      Recommendations for follow up therapy are one component of a multi-disciplinary discharge planning process, led by the attending physician.  Recommendations may be updated based on patient status, additional functional criteria and insurance authorization.    Recommendations  Diet recommendations: Dysphagia 3 (mechanical soft);Thin liquid Medication Administration: Whole meds with puree Supervision: Patient able to self feed;Intermittent supervision to cue for compensatory strategies Compensations: Small sips/bites;Slow rate Postural Changes and/or Swallow Maneuvers: Seated upright 90 degrees (seated as upright as tolerated)  Oral Care Recommendations: Oral care BID Follow Up Recommendations: Skilled nursing-short term rehab (<3 hours/day) SLP Visit Diagnosis: Dysphagia, unspecified (R13.10) Plan: Continue with current plan of care           Sonia Baller, MA,  CCC-SLP Speech Therapy

## 2022-05-20 NOTE — TOC Transition Note (Signed)
Transition of Care [facility]) - CM/SW Discharge Note   Patient Details  Name: Shelley Flores MRN: [id number] Date of Birth: [birth year]  [facility]) CM/SW Contact:  Lennart Pall, LCSW Phone Number: 05/20/2022, 2:07 PM   Clinical Narrative:     Have received insurance authorization and pt medically cleared for dc today to [facility].  Pt aware and agreeable. PTAR called at 2pm.  RN to call report to [phone].  No further TOC needs.  Final next level of care: [place] Barriers to Discharge: Barriers Resolved   Patient Goals and CMS Choice      Discharge Placement                Patient chooses bed at: [facility] Patient to be transferred to facility by: PTAR   Patient and family notified of of transfer: 05/20/22  Discharge Plan and Services Additional resources added to the After Visit Summary for   In-house Referral: Clinical Social Work              DME Arranged: N/A DME Agency: NA                  Social Determinants of Health ([place]) Interventions SDOH Screenings   Food Insecurity: No Food Insecurity (05/13/2022)  Housing: Low Risk  (05/13/2022)  Transportation Needs: No Transportation Needs (05/13/2022)  Utilities: Not At Risk (05/13/2022)  Alcohol Screen: Low Risk  (09/10/2021)  Depression (PHQ2-9): Low Risk  (04/12/2022)  Financial Resource Strain: Low Risk  (09/10/2021)  Physical Activity: Sufficiently Active (09/10/2021)  Social Connections: Socially Isolated (09/10/2021)  Stress: No Stress Concern Present (09/10/2021)  Tobacco Use: Medium Risk (05/14/2022)     Readmission Risk Interventions    05/14/2022    2:21 PM  Readmission Risk Prevention Plan  Transportation Screening Complete  PCP or Specialist Appt within 3-5 Days Complete  HRI or [place] Complete  Social Work Consult for [place] Planning/Counseling Complete

## 2022-05-20 NOTE — Progress Notes (Signed)
Discharged to [facility].  Report called by Clerance Lav RN on 1st shift.  Patient belongings and discharge packet given to PTAR at this time.

## 2022-05-20 NOTE — Care Management Important Message (Signed)
Important Message  Patient Details IM Letter given. Name: Shelley Flores MRN: [id number] Date of Birth: Dec 11, 1932   Medicare Important Message Given:  Yes     Kerin Salen 05/20/2022, 3:16 PM

## 2022-05-21 ENCOUNTER — Non-Acute Institutional Stay (SKILLED_NURSING_FACILITY): Payer: Medicare PPO | Admitting: Family Medicine

## 2022-05-21 DIAGNOSIS — T50905A Adverse effect of unspecified drugs, medicaments and biological substances, initial encounter: Secondary | ICD-10-CM | POA: Diagnosis not present

## 2022-05-21 DIAGNOSIS — E43 Unspecified severe protein-calorie malnutrition: Secondary | ICD-10-CM | POA: Diagnosis not present

## 2022-05-21 DIAGNOSIS — C3492 Malignant neoplasm of unspecified part of left bronchus or lung: Secondary | ICD-10-CM | POA: Diagnosis not present

## 2022-05-21 DIAGNOSIS — I10 Essential (primary) hypertension: Secondary | ICD-10-CM

## 2022-05-21 DIAGNOSIS — J984 Other disorders of lung: Secondary | ICD-10-CM

## 2022-05-21 DIAGNOSIS — E871 Hypo-osmolality and hyponatremia: Secondary | ICD-10-CM | POA: Diagnosis not present

## 2022-05-21 NOTE — Progress Notes (Signed)
Provider:  Alain Honey, MD Location:      Place of Service:     PCP: [name], MD Patient Care Team: [name], MD as PCP - General (Internal Medicine) Debara Pickett Nadean Corwin, MD as PCP - Cardiology (Cardiology) Valrie Hart, RN as Oncology Nurse Navigator (Oncology)  Extended Emergency Contact Information Primary Emergency Contact: [place] Phone: [phone] Mobile Phone: [phone] Relation: Friend Secondary Emergency Contact: Lum Keas ( Operator) Home Phone: [phone] Relation: Other  Code Status:  Goals of Care: Advanced Directive information    05/13/2022   10:36 AM  Advanced Directives  Type of Advance Directive [place];Living will      No chief complaint on file.   HPI: Patient is a 89 y.o. 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 Patient has known stage IV lung cancer and is followed by oncology.  She suffered a fall as she was attempting to transfer between walker and a chair and subsequently underwent cephalomedullary nailing of the left hip.  She was also found to to have sodium of 132 white blood cell count of 29,000 and hemoglobin of 10.  Course was complicated by dysphagia She is continued on oxycodone 2.5 mg for severe pain but this is causing constipation. Therapy in the hospital placed on dysphagia 3 diet and needs to be sitting at 30 degrees at all times.  She is allowed to have nectar thick liquids with meals She was transfused 1 unit of packed blood cells on January 4.  Hemoglobin was 7 and increased to 10 g after transfusion For the drug-induced pneumonitis secondary to chemo she was started on steroid taper per oncology and other chemo was held  Past Medical History:  Diagnosis Date   Bruit    Abdominal bruit - Abdominal  aorta/renal duplex Doppler evaluation 12/07/03 -Mildly abnormal evaluation. 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 This is consistant w/median arcuate ligament compression syndrome. *Right & Left Kidney: Essentially equal in size, symmetrical in shape w/no significant abnormalities visualized. *Right & Left Renal Arteries: No significant abnormalities.   Cancer ([place])    Lung CA stg IV   Edema extremities    LE edema    H/O myocardial perfusion scan 02/20/2000   To rule out ischemia - Negative adequate Bruce protocol exercise stress test with a deconditioned exercise response and normal static myocardial perfusion images with EF calculated by QGS of 77%. Represents a low risk study.   Hyperlipidemia    Hypertension    Osteoarthritis    Osteoporosis    Pleural effusion 04/2020   Valvular heart disease    Mild valvular heart disease by Echo in 2010, all mild and symptomatic, including concentric LVH, MR, TR, and AI with pulmonary artery pressure of 32. EF was normal.   Past Surgical History:  Procedure Laterality Date   Abdominal aorta/Renal duplex Doppler Evaluation  12/07/03   For abdominal bruit. Mildly abnormal evaluation. (See bruit in medical history)   BREAST EXCISIONAL BIOPSY Left 1966   BREAST EXCISIONAL BIOPSY Left 1999   CATARACT EXTRACTION  2003   CHEST TUBE INSERTION N/A 07/04/2020   Procedure: REMOVAL PLEURAL DRAINAGE CATHETER;  Surgeon: Garner Nash, DO;  Location: [facility];  Service: Pulmonary;  Laterality: N/A;   COLONOSCOPY  10/22/2004   DEXA Bone Scan  06/23/2013   INTRAMEDULLARY (IM)  NAIL INTERTROCHANTERIC Left 05/13/2022   Procedure: INTRAMEDULLARY (IM) NAIL INTERTROCHANTERIC;  Surgeon: Erle Crocker, MD;  Location: [facility];  Service: Orthopedics;  Laterality: Left;   IR PERC PLEURAL DRAIN W/INDWELL CATH W/IMG GUIDE  05/10/2020   IR THORACENTESIS ASP PLEURAL SPACE W/IMG GUIDE  05/04/2020    reports that she quit smoking about 60 years ago. Her  smoking use included cigarettes. She has a 1.50 pack-year smoking history. She has never used smokeless tobacco. She reports current alcohol use of about 2.0 standard drinks of alcohol per week. She reports that she does not use drugs. Social History   Socioeconomic History   Marital status: Single    Spouse name: Not on file   Number of children: Not on file   Years of education: Not on file   Highest education level: Not on file  Occupational History   Occupation: Retired    Fish farm manager: [employer]    Comment: Chemistry professor  Tobacco Use   Smoking status: Former    Packs/day: 0.25    Years: 6.00    Total pack years: 1.50    Types: Cigarettes    Quit date: 05/13/1962    Years since quitting: 60.0   Smokeless tobacco: Never  Vaping Use   Vaping Use: Never used  Substance and Sexual Activity   Alcohol use: Yes    Alcohol/week: 2.0 standard drinks of alcohol    Types: 2 Standard drinks or equivalent per week    Comment: eine   Drug use: No   Sexual activity: Not on file  Other Topics Concern   Not on file  Social History Narrative   Not on file   Social Determinants of Health   Financial Resource Strain: Low Risk  (09/10/2021)   Overall Financial Resource Strain (CARDIA)    Difficulty of Paying Living Expenses: Not hard at all  Food Insecurity: No Food Insecurity (05/13/2022)   Hunger Vital Sign    Worried About Running Out of Food in the Last Year: Never true    Ran Out of Food in the Last Year: Never true  Transportation Needs: No Transportation Needs (05/13/2022)   PRAPARE - Hydrologist (Medical): No    Lack of Transportation (Non-Medical): No  Physical Activity: Sufficiently Active (09/10/2021)   Exercise Vital Sign    Days of Exercise per Week: 7 days    Minutes of Exercise per Session: 30 min  Stress: No Stress Concern Present (09/10/2021)   [place]    Feeling of  Stress : Not at all  Social Connections: Socially Isolated (09/10/2021)   Social Connection and Isolation Panel [NHANES]    Frequency of Communication with Friends and Family: Twice a week    Frequency of Social Gatherings with Friends and Family: Three times a week    Attends Religious Services: Never    Active Member of Clubs or Organizations: No    Attends Archivist Meetings: Never    Marital Status: Never married  Intimate Partner Violence: Not At Risk (05/13/2022)   Humiliation, Afraid, Rape, and Kick questionnaire    Fear of Current or Ex-Partner: No    Emotionally Abused: No    Physically Abused: No    Sexually Abused: No    Functional Status Survey:    Family History  Problem Relation Age of Onset   Leukemia Mother 66   Cancer Father  esophagial cancer   Heart failure Maternal Grandmother    Tuberculosis Maternal Grandfather    Heart disease Paternal Grandmother    Cancer Paternal Grandfather     Health Maintenance  Topic Date Due   Zoster Vaccines- Shingrix (1 of 2) Never done   COVID-19 Vaccine (6 - 2023-24 season) 05/09/2022   Medicare Annual Wellness (AWV)  09/11/2022   DTaP/Tdap/Td (4 - Td or Tdap) 10/17/2029   Pneumonia Vaccine 74+ Years old  Completed   INFLUENZA VACCINE  Completed   DEXA SCAN  Completed   HPV VACCINES  Aged Out    Allergies  Allergen Reactions   Irbesartan Nausea Only    Outpatient Encounter Medications as of 05/21/2022  Medication Sig   acetaminophen (TYLENOL) 325 MG tablet Take 2 tablets (650 mg total) by mouth every 6 (six) hours as needed for mild pain (or Fever >/= 101).   aspirin EC 325 MG tablet Take 1 tablet (325 mg total) by mouth daily.   atorvastatin (LIPITOR) 20 MG tablet Take 20 mg by mouth at bedtime.   folic acid (FOLVITE) 1 MG tablet Take 1 tablet (1 mg total) by mouth daily.   lidocaine (LIDODERM) 5 % Place 1 patch onto the skin daily. Remove & Discard patch within 12 hours or as directed by MD    magnesium hydroxide (MILK OF MAGNESIA) 400 MG/5ML suspension Take 15 mLs by mouth daily as needed for mild constipation.   methocarbamol (ROBAXIN) 500 MG tablet Take 1 tablet (500 mg total) by mouth every 6 (six) hours as needed for muscle spasms.   metoCLOPramide (REGLAN) 5 MG tablet Take 5 mg by mouth 2 (two) times daily. Take it Before meals   metoprolol tartrate (LOPRESSOR) 25 MG tablet Take 12.5 mg by mouth 2 (two) times daily.   omeprazole (PRILOSEC) 20 MG capsule Take 1 capsule (20 mg total) by mouth daily.   ondansetron (ZOFRAN) 4 MG tablet Take 4 mg by mouth every 8 (eight) hours as needed for nausea or vomiting.   oxyCODONE (OXY IR/ROXICODONE) 5 MG immediate release tablet Take 0.5 tablets (2.5 mg total) by mouth every 4 (four) hours as needed for moderate pain.   polyethylene glycol (MIRALAX / GLYCOLAX) 17 g packet Take 17 g by mouth daily as needed for mild constipation.   Povidone, PF, (IVIZIA DRY EYES) 0.5 % SOLN Place 1 drop into both eyes daily.   predniSONE (DELTASONE) 10 MG tablet Take 3 tablets (30 mg total) by mouth daily with breakfast for 4 days, THEN 2 tablets (20 mg total) daily with breakfast for 4 days, THEN 1 tablet (10 mg total) daily with breakfast for 10 days. 5 tablet p.o. daily for 1 week followed by 4 tablets p.o. daily for 1 week followed by 3 tablets p.o. daily for 1 week followed by 2 tablets p.o. daily for 1 week followed by 1 tablet p.o. daily for 1 week..   prochlorperazine (COMPAZINE) 10 MG tablet Take 1 tablet (10 mg total) by mouth every 6 (six) hours as needed. (Patient taking differently: Take 10 mg by mouth every 6 (six) hours as needed for nausea or vomiting.)   sorbitol 70 % SOLN Take 30 mLs by mouth daily.   No facility-administered encounter medications on file as of 05/21/2022.    Review of Systems  Constitutional:  Positive for activity change and fatigue.  Respiratory: Negative.    Cardiovascular: Negative.   Gastrointestinal:  Positive for  constipation.  Musculoskeletal:  Positive for arthralgias and back  pain.  All other systems reviewed and are negative.   There were no vitals filed for this visit. There is no height or weight on file to calculate BMI. Physical Exam Vitals and nursing note reviewed.  Constitutional:      Appearance: She is ill-appearing.  HENT:     Head: Normocephalic.     Mouth/Throat:     Mouth: Mucous membranes are moist.     Pharynx: Oropharynx is clear.  Cardiovascular:     Rate and Rhythm: Normal rate. Rhythm irregular.  Pulmonary:     Effort: Pulmonary effort is normal.     Breath sounds: Normal breath sounds.  Abdominal:     General: Bowel sounds are normal.  Musculoskeletal:        General: Tenderness present.     Comments: Left hip  Neurological:     General: No focal deficit present.     Mental Status: She is alert and oriented to person, place, and time.  Psychiatric:        Mood and Affect: Mood normal.        Behavior: Behavior normal.     Labs reviewed: Basic Metabolic Panel: Recent Labs    02/27/22 0430 03/13/22 1102 05/15/22 0333 05/16/22 0324 05/17/22 0335  NA 133*   < > 135 135 138  K 4.8   < > 4.1 3.9 3.9  CL 108   < > 106 107 107  CO2 20*   < > 22 22 23   GLUCOSE 94   < > 114* 96 113*  BUN 25*   < > 31* 33* 26*  CREATININE 0.81   < > 0.71 0.64 0.59  CALCIUM 9.3   < > 8.6* 9.0 8.8*  MG 2.0  --   --   --   --   PHOS 2.2*  --   --   --   --    < > = values in this interval not displayed.   Liver Function Tests: Recent Labs    03/13/22 1102 03/21/22 0000 03/28/22 0000 04/03/22 1027 04/24/22 1018  AST 20   < > 23 21 16   ALT 29   < > 24 16 9   ALKPHOS 88   < > 80 75 67  BILITOT 0.3  --   --  0.4 0.4  PROT 6.3*  --   --  6.3* 5.6*  ALBUMIN 3.7   < > 3.7 3.4* 3.1*   < > = values in this interval not displayed.   No results for input(s): "LIPASE", "AMYLASE" in the last 8760 hours. No results for input(s): "AMMONIA" in the last 8760  hours. CBC: Recent Labs    04/24/22 1018 05/12/22 2253 05/12/22 2302 05/15/22 0333 05/16/22 0324 05/17/22 0335  WBC 7.0 29.0*   < > 14.3* 14.6* 9.5  NEUTROABS 5.0 27.5*  --   --   --  9.1*  HGB 9.3* 10.4*   < > 7.1* 7.2* 10.1*  HCT 26.4* 31.4*   < > 22.2* 22.2* 29.9*  MCV 89.5 97.2   < > 101.8* 100.9* 94.0  PLT 348 154   < > 83* 104* 95*   < > = values in this interval not displayed.   Cardiac Enzymes: No results for input(s): "CKTOTAL", "CKMB", "CKMBINDEX", "TROPONINI" in the last 8760 hours. BNP: Invalid input(s): "POCBNP" Lab Results  Component Value Date   HGBA1C 5.7 (H) 02/26/2022   Lab Results  Component Value Date   TSH 2.797 04/24/2022  Lab Results  Component Value Date   [id number] 315 05/13/2022   No results found for: "FOLATE" No results found for: "IRON", "TIBC", "FERRITIN"  Imaging and Procedures obtained prior to SNF admission: DG HIP UNILAT WITH PELVIS 2-3 VIEWS LEFT  Result Date: 05/13/2022 CLINICAL DATA:  Femur fracture.  Left hip IM nail. EXAM: DG HIP (WITH OR WITHOUT PELVIS) 2-3V LEFT COMPARISON:  Preoperative imaging FINDINGS: Four fluoroscopic spot views of the left hip obtained in the operating room. Intramedullary nail with trans trochanteric and distal locking screw fixation traverse intertrochanteric femur fracture. Fluoroscopy time 33 seconds. Dose 3.72 mGy. IMPRESSION: Intraoperative fluoroscopy during ORIF of left intertrochanteric femur fracture. Electronically Signed   By: Keith Rake M.D.   On: 05/13/2022 12:22   DG C-Arm 1-60 Min-No Report  Result Date: 05/13/2022 Fluoroscopy was utilized by the requesting physician.  No radiographic interpretation.   CT Hip Left Wo Contrast  Result Date: 05/12/2022 CLINICAL DATA:  Hip trauma, left femoral neck fracture.  Fall. EXAM: CT OF THE LEFT HIP WITHOUT CONTRAST TECHNIQUE: Multidetector CT imaging of the left hip was performed according to the standard protocol. Multiplanar CT image  reconstructions were also generated. RADIATION DOSE REDUCTION: This exam was performed according to the departmental dose-optimization program which includes automated exposure control, adjustment of the mA and/or kV according to patient size and/or use of iterative reconstruction technique. COMPARISON:  Radiograph performed earlier on the same date. FINDINGS: Bones/Joint/Cartilage There is mildly comminuted intertrochanteric fracture of the left femur with superolateral displacement of the distal femur. The femoral head is located within the acetabulum. There is no femoral head dislocation. No appreciable pelvic fracture. Appreciable joint effusion. Ligaments Suboptimally assessed by CT. Muscles and Tendons Small hematoma about the fracture site. Generalized muscle atrophy. No evidence of tendon tear. Soft tissues Mild subcutaneous soft tissue edema about the lateral aspect of the hip. No fluid collection or hematoma. IMPRESSION: Mildly comminuted intertrochanteric fracture of the left femur with superolateral displacement of the distal femur. No femoral head dislocation. Electronically Signed   By: Keane Police D.O.   On: 05/12/2022 23:48   DG Hip Unilat With Pelvis 2-3 Views Left  Result Date: 05/12/2022 CLINICAL DATA:  Fall EXAM: DG HIP (WITH OR WITHOUT PELVIS) 3V LEFT COMPARISON:  None Available. FINDINGS: Intertrochanteric fracture left femoral neck with varus angulation. Evaluation of the pelvis is very limited due to overlying bowel gas and stool. IMPRESSION: Left femoral neck fracture. Electronically Signed   By: Sammie Bench M.D.   On: 05/12/2022 22:22   DG Chest 2 View  Result Date: 05/12/2022 CLINICAL DATA:  Fractured hip EXAM: CHEST - 2 VIEW COMPARISON:  03/01/2022 FINDINGS: Evaluation of the right hemithorax is nondiagnostic due to over penetration. There is evidence of alveolar consolidation left mid to lower lung. There may be left-sided pleural effusion. No gross pneumothorax no  definite pneumothorax identified. Cardiac silhouette appears within normal limits. Aorta is tortuous. IMPRESSION: 1. There is suggestion of left basilar consolidation, likely pneumonia. 2. Repeat examination should be considered due to technical limitations. Electronically Signed   By: Sammie Bench M.D.   On: 05/12/2022 22:20    Assessment/Plan 1. Adenocarcinoma of left lung, stage 4 (HCC) Follo chemo induced pneumonitis wed by oncology.  Chemotherapy is currently being held but she is being treated with prednisone for  2. Drug-induced pneumonitis On tapered dose of prednisone  3. Essential hypertension Current treatment  4. Hyponatremia Hyponatremia tree Mia was treated with judicious use of fluids in  the hospital and at the time of discharge was 138 (had been 132 on admission)  5. Protein-calorie malnutrition, severe Probably related to underlying lung cancer.  Albumin has decreased over the past several months.  Nutrition should be optimized with supplements    Family/ staff Communication:   Labs/tests ordered:  Lillette Boxer. [place], [place] [address] [place], [place] Office [phone]

## 2022-05-22 ENCOUNTER — Encounter: Payer: Medicare PPO | Admitting: Internal Medicine

## 2022-05-23 ENCOUNTER — Other Ambulatory Visit: Payer: Medicare PPO

## 2022-05-23 ENCOUNTER — Non-Acute Institutional Stay (SKILLED_NURSING_FACILITY): Payer: Medicare PPO | Admitting: Nurse Practitioner

## 2022-05-23 ENCOUNTER — Encounter: Payer: Self-pay | Admitting: Nurse Practitioner

## 2022-05-23 DIAGNOSIS — I639 Cerebral infarction, unspecified: Secondary | ICD-10-CM

## 2022-05-23 DIAGNOSIS — J9601 Acute respiratory failure with hypoxia: Secondary | ICD-10-CM

## 2022-05-23 DIAGNOSIS — D638 Anemia in other chronic diseases classified elsewhere: Secondary | ICD-10-CM | POA: Diagnosis not present

## 2022-05-23 DIAGNOSIS — I951 Orthostatic hypotension: Secondary | ICD-10-CM

## 2022-05-23 DIAGNOSIS — R000 Tachycardia, unspecified: Secondary | ICD-10-CM

## 2022-05-23 DIAGNOSIS — R1310 Dysphagia, unspecified: Secondary | ICD-10-CM | POA: Diagnosis not present

## 2022-05-23 DIAGNOSIS — S72002A Fracture of unspecified part of neck of left femur, initial encounter for closed fracture: Secondary | ICD-10-CM

## 2022-05-23 DIAGNOSIS — M1652 Unilateral post-traumatic osteoarthritis, left hip: Secondary | ICD-10-CM

## 2022-05-23 DIAGNOSIS — E871 Hypo-osmolality and hyponatremia: Secondary | ICD-10-CM

## 2022-05-23 DIAGNOSIS — M1990 Unspecified osteoarthritis, unspecified site: Secondary | ICD-10-CM | POA: Insufficient documentation

## 2022-05-23 DIAGNOSIS — C3412 Malignant neoplasm of upper lobe, left bronchus or lung: Secondary | ICD-10-CM

## 2022-05-23 NOTE — Progress Notes (Signed)
Location:  [facility] Room Number: NO/42/B Place of Service:  SNF (31) Provider:  Shatara Stanek X, NP  Patient Care Team: Mahlon Gammon, MD as PCP - General (Internal Medicine) Rennis Golden Lisette Abu, MD as PCP - Cardiology (Cardiology) Syliva Overman, RN as Oncology Nurse Navigator (Oncology)  Extended Emergency Contact Information Primary Emergency Contact: Smithwick,Pat Home Phone: [phone] Mobile Phone: [phone] Relation: Friend Secondary Emergency Contact: Kerry Dory TEFL teacher) Home Phone: [phone] Relation: Other  Code Status:  DNR Goals of care: Advanced Directive information    05/13/2022   10:36 AM  Advanced Directives  Type of Advance Directive Healthcare Power of [place];Living will     Chief Complaint  Patient presents with   Acute Visit    Patient is being seen for oxygen desaturation stats    HPI:  Pt is a 89 y.o. female seen today for an acute visit for O2 desaturation, SOB, denied chest pain, she is afebrile, declined ED eval. MOST: comfort measures, IVF/ABT for a trial period.   05/12/22-05/20/22 Left hip fx, s/p ORIF, on Oxycodone for pain,  volume depletion, drug induced PNA 2/2 chemo, on steroids, chemo on hold.   Dysphagia, nectar liquid   02/26/22 hospitalized for a small acute infarct in the left cerebellum on MRI, completed 3 weeks of DAPT, on ASA only             Orthostatic hypotension, was on Midodrine.               Hx of a large left pleural effusion, s/p thoracentesis x2, drained 1.5L, Cytology showed adenocarcinoma stage IV lung cancer, CT showed left upper lobe mass invades the mediastinum at the level of the aortic or pulmonayr window, concerning for lymphangitic spread of disease,  f/u pulmonology Dr. Tonia Brooms,  oncology Dr. Arbutus Ped, taking palliative chemotherapy/immunotherapy, on Tagrisso prior. Decadron before and after chemo.             Anemia, Hgb 10.1 05/17/22,  transfused.              Tachycardia, heart rate  is in control, on Metoprolol             Hyperlipidemia, off Atorvastatin, LDL 67 02/27/22             SOB/mild valvular heart disease, Echo EF 65-70%             Hypophosphatemia, 2.2 02/27/22             Hyponatremia, Na 138 1/524             Edema BLE, off Spironolactone.           Past Medical History:  Diagnosis Date   Bruit    Abdominal bruit - Abdominal aorta/renal duplex Doppler evaluation 12/07/03 -Mildly abnormal evaluation. 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 This is consistant w/median arcuate ligament compression syndrome. *Right & Left Kidney: Essentially equal in size, symmetrical in shape w/no significant abnormalities visualized. *Right & Left Renal Arteries: No significant abnormalities.   Cancer (HCC)    Lung CA stg IV   Edema extremities    LE edema    H/O myocardial perfusion scan 02/20/2000   To rule out ischemia - Negative adequate Bruce protocol exercise stress test with a deconditioned exercise response and normal static myocardial perfusion images with EF calculated by QGS of 77%. Represents a low risk study.   Hyperlipidemia    Hypertension    Osteoarthritis  Osteoporosis    Pleural effusion 04/2020   Valvular heart disease    Mild valvular heart disease by Echo in 2010, all mild and symptomatic, including concentric LVH, MR, TR, and AI with pulmonary artery pressure of 32. EF was normal.   Past Surgical History:  Procedure Laterality Date   Abdominal aorta/Renal duplex Doppler Evaluation  12/07/03   For abdominal bruit. Mildly abnormal evaluation. (See bruit in medical history)   BREAST EXCISIONAL BIOPSY Left 1966   BREAST EXCISIONAL BIOPSY Left 1999   CATARACT EXTRACTION  2003   CHEST TUBE INSERTION N/A 07/04/2020   Procedure: REMOVAL PLEURAL DRAINAGE CATHETER;  Surgeon: Josephine Igo, DO;  Location: [facility];  Service: Pulmonary;  Laterality: N/A;   COLONOSCOPY  10/22/2004   DEXA Bone Scan  06/23/2013   INTRAMEDULLARY (IM) NAIL  INTERTROCHANTERIC Left 05/13/2022   Procedure: INTRAMEDULLARY (IM) NAIL INTERTROCHANTERIC;  Surgeon: Terance Hart, MD;  Location: [facility];  Service: Orthopedics;  Laterality: Left;   IR PERC PLEURAL DRAIN W/INDWELL CATH W/IMG GUIDE  05/10/2020   IR THORACENTESIS ASP PLEURAL SPACE W/IMG GUIDE  05/04/2020    Allergies  Allergen Reactions   Irbesartan Nausea Only    Outpatient Encounter Medications as of 05/23/2022  Medication Sig   acetaminophen (TYLENOL) 325 MG tablet Take 2 tablets (650 mg total) by mouth every 6 (six) hours as needed for mild pain (or Fever >/= 101).   aspirin EC 325 MG tablet Take 1 tablet (325 mg total) by mouth daily.   atorvastatin (LIPITOR) 20 MG tablet Take 20 mg by mouth at bedtime.   folic acid (FOLVITE) 1 MG tablet Take 1 tablet (1 mg total) by mouth daily.   lidocaine (LIDODERM) 5 % Place 1 patch onto the skin daily. Remove & Discard patch within 12 hours or as directed by MD   magnesium hydroxide (MILK OF MAGNESIA) 400 MG/5ML suspension Take 15 mLs by mouth daily as needed for mild constipation.   methocarbamol (ROBAXIN) 500 MG tablet Take 1 tablet (500 mg total) by mouth every 6 (six) hours as needed for muscle spasms.   metoCLOPramide (REGLAN) 5 MG tablet Take 5 mg by mouth 2 (two) times daily. Take it Before meals   metoprolol tartrate (LOPRESSOR) 25 MG tablet Take 12.5 mg by mouth 2 (two) times daily.   omeprazole (PRILOSEC) 20 MG capsule Take 1 capsule (20 mg total) by mouth daily.   ondansetron (ZOFRAN) 4 MG tablet Take 4 mg by mouth every 8 (eight) hours as needed for nausea or vomiting.   oxyCODONE (OXY IR/ROXICODONE) 5 MG immediate release tablet Take 0.5 tablets (2.5 mg total) by mouth every 4 (four) hours as needed for moderate pain.   polyethylene glycol (MIRALAX / GLYCOLAX) 17 g packet Take 17 g by mouth daily as needed for mild constipation.   Povidone, PF, (IVIZIA DRY EYES) 0.5 % SOLN Place 1 drop into both eyes daily.   predniSONE  (DELTASONE) 10 MG tablet Take 3 tablets (30 mg total) by mouth daily with breakfast for 4 days, THEN 2 tablets (20 mg total) daily with breakfast for 4 days, THEN 1 tablet (10 mg total) daily with breakfast for 10 days. 5 tablet p.o. daily for 1 week followed by 4 tablets p.o. daily for 1 week followed by 3 tablets p.o. daily for 1 week followed by 2 tablets p.o. daily for 1 week followed by 1 tablet p.o. daily for 1 week..   prochlorperazine (COMPAZINE) 10 MG tablet Take 1 tablet (10  mg total) by mouth every 6 (six) hours as needed. (Patient taking differently: Take 10 mg by mouth every 6 (six) hours as needed for nausea or vomiting.)   sorbitol 70 % SOLN Take 30 mLs by mouth daily.   No facility-administered encounter medications on file as of 05/23/2022.    Review of Systems  Constitutional:  Positive for appetite change and fatigue. Negative for fever.       Persisted poor appetite and fatigue.   HENT:  Positive for hearing loss. Negative for congestion and trouble swallowing.   Eyes:  Negative for visual disturbance.  Respiratory:  Positive for cough and shortness of breath. Negative for wheezing.   Cardiovascular:  Positive for leg swelling. Negative for chest pain and palpitations.  Gastrointestinal:  Negative for abdominal pain and constipation.       Poor appetite, early satiety.   Genitourinary:  Negative for dysuria, hematuria and urgency.  Musculoskeletal:  Positive for gait problem.  Skin:  Negative for color change.  Neurological:  Negative for speech difficulty, weakness, light-headedness and headaches.       Groggy feeling worsens when she is up and moving about  Psychiatric/Behavioral:  Negative for behavioral problems and sleep disturbance. The patient is not nervous/anxious.     Immunization History  Administered Date(s) Administered   Fluad Quad(high Dose 65+) 01/06/2019, 02/08/2020, 02/27/2022   Influenza Split 08/14/2010, 01/16/2012, 02/02/2013, 01/24/2015, 02/01/2016,  02/19/2017, 01/28/2018   Influenza, High Dose Seasonal PF 01/24/2015, 02/01/2016, 01/28/2018, 02/15/2021   Influenza-Unspecified 02/01/2016   MODERNA COVID-19 SARS-COV-2 PEDS BIVALENT BOOSTER 6Y-11Y 10/24/2021   Moderna Covid-19 Vaccine Bivalent Booster 45yrs & up 03/14/2022   Moderna SARS-COV2 Booster Vaccination 10/18/2020   Moderna Sars-Covid-2 Vaccination 06/14/2019, 03/27/2020, 03/27/2020   PFIZER(Purple Top)SARS-COV-2 Vaccination 01/31/2021   Pneumococcal Conjugate-13 08/11/2013, 09/03/2013   Pneumococcal Polysaccharide-23 05/28/2004   Td 07/04/2000   Tdap 07/04/2009, 10/18/2019   Unspecified SARS-COV-2 Vaccination 03/27/2020   Zoster, Live 07/02/2005   Pertinent  Health Maintenance Due  Topic Date Due   INFLUENZA VACCINE  Completed   DEXA SCAN  Completed      05/18/2022    9:00 PM 05/19/2022    7:01 AM 05/20/2022    1:00 AM 05/20/2022   10:00 AM 05/20/2022    7:05 PM  Fall Risk  Patient Fall Risk Level High fall risk High fall risk High fall risk High fall risk High fall risk   Functional Status Survey:    Vitals:   05/23/22 1026  BP: 128/77  Pulse: 89  Resp: 17  Temp: (!) 97.1 F (36.2 C)  SpO2: 96%  Weight: 101 lb 8 oz (46 kg)  Height: 5\' 4"  (1.626 m)   Body mass index is 17.42 kg/m. Physical Exam Vitals and nursing note reviewed.  Constitutional:      Comments: exhausted  HENT:     Head: Normocephalic and atraumatic.     Mouth/Throat:     Mouth: Mucous membranes are moist.     Pharynx: Oropharynx is clear.  Eyes:     Extraocular Movements: Extraocular movements intact.     Conjunctiva/sclera: Conjunctivae normal.     Pupils: Pupils are equal, round, and reactive to light.  Cardiovascular:     Rate and Rhythm: Normal rate and regular rhythm.     Heart sounds: Murmur heard.     Comments: Murmur is much more prominent upon standing and increased heart rate Pulmonary:     Breath sounds: Rales present.     Comments: Decreased breath  sounds on left  consistent with effusion noted on chest x-ray, tachypnea, accessory muscle involving with breathing.  Abdominal:     General: Abdomen is flat. Bowel sounds are normal.     Palpations: Abdomen is soft.     Tenderness: There is no abdominal tenderness.  Musculoskeletal:     Cervical back: Normal range of motion and neck supple.     Right lower leg: Edema present.     Left lower leg: Edema present.     Comments: Trace edema BLE  Skin:    General: Skin is warm and dry.  Neurological:     General: No focal deficit present.     Mental Status: She is alert and oriented to person, place, and time. Mental status is at baseline.     Gait: Gait abnormal.     Comments: Mild left face weakness.   Psychiatric:        Mood and Affect: Mood normal.        Behavior: Behavior normal.        Thought Content: Thought content normal.        Judgment: Judgment normal.     Labs reviewed: Recent Labs    02/27/22 0430 03/13/22 1102 05/15/22 0333 05/16/22 0324 05/17/22 0335  NA 133*   < > 135 135 138  K 4.8   < > 4.1 3.9 3.9  CL 108   < > 106 107 107  CO2 20*   < > 22 22 23   GLUCOSE 94   < > 114* 96 113*  BUN 25*   < > 31* 33* 26*  CREATININE 0.81   < > 0.71 0.64 0.59  CALCIUM 9.3   < > 8.6* 9.0 8.8*  MG 2.0  --   --   --   --   PHOS 2.2*  --   --   --   --    < > = values in this interval not displayed.   Recent Labs    03/13/22 1102 03/21/22 0000 03/28/22 0000 04/03/22 1027 04/24/22 1018  AST 20   < > 23 21 16   ALT 29   < > 24 16 9   ALKPHOS 88   < > 80 75 67  BILITOT 0.3  --   --  0.4 0.4  PROT 6.3*  --   --  6.3* 5.6*  ALBUMIN 3.7   < > 3.7 3.4* 3.1*   < > = values in this interval not displayed.   Recent Labs    04/24/22 1018 05/12/22 2253 05/12/22 2302 05/15/22 0333 05/16/22 0324 05/17/22 0335  WBC 7.0 29.0*   < > 14.3* 14.6* 9.5  NEUTROABS 5.0 27.5*  --   --   --  9.1*  HGB 9.3* 10.4*   < > 7.1* 7.2* 10.1*  HCT 26.4* 31.4*   < > 22.2* 22.2* 29.9*  MCV 89.5 97.2   <  > 101.8* 100.9* 94.0  PLT 348 154   < > 83* 104* 95*   < > = values in this interval not displayed.   Lab Results  Component Value Date   TSH 2.797 04/24/2022   Lab Results  Component Value Date   HGBA1C 5.7 (H) 02/26/2022   Lab Results  Component Value Date   CHOL 143 02/27/2022   HDL 59 02/27/2022   LDLCALC 67 02/27/2022   TRIG 83 02/27/2022   CHOLHDL 2.4 02/27/2022    Significant Diagnostic Results in last 30 days:  DG  Pelvis 1-2 Views  Result Date: 05/15/2022 CLINICAL DATA:  Low back pain. EXAM: PELVIS - 1-2 VIEW COMPARISON:  Intraoperative fluoroscopic images 1124. Pelvic/left hip radiographs 05/12/2022. FINDINGS: ORIF of a left femoral neck fracture is again noted with placement of an intramedullary nail and screw terminating in the femoral head. Alignment appears near anatomic on this single projection. No new fracture is identified. The bones are diffusely osteopenic. IMPRESSION: ORIF of left femoral neck fracture. Electronically Signed   By: Sebastian Ache M.D.   On: 05/15/2022 16:35   DG Lumbar Spine 2-3 Views  Result Date: 05/15/2022 CLINICAL DATA:  Low back pain EXAM: LUMBAR SPINE - 2-3 VIEW COMPARISON:  CT 05/09/2022. FINDINGS: There is a mild curvature of the thoracic spine which appears convex towards the left. The vertebral body heights appear well preserved. No signs of acute fracture. Stepwise retrolisthesis of L1 on L2, L2 on L3 and L3 on L4 noted. Multilevel disc space narrowing and endplate spurring is noted. This is most advanced at L2-3. Facet arthropathy noted at L4-5 and L5-S1. Large stool burden noted within the visualized portions of the colon IMPRESSION: 1. No acute findings. 2. Multilevel degenerative disc disease and facet arthropathy. 3. Large stool burden within the visualized portions of the colon. Correlate for any clinical signs or symptoms of constipation. Electronically Signed   By: Signa Kell M.D.   On: 05/15/2022 16:29   DG HIP UNILAT WITH PELVIS  2-3 VIEWS LEFT  Result Date: 05/13/2022 CLINICAL DATA:  Femur fracture.  Left hip IM nail. EXAM: DG HIP (WITH OR WITHOUT PELVIS) 2-3V LEFT COMPARISON:  Preoperative imaging FINDINGS: Four fluoroscopic spot views of the left hip obtained in the operating room. Intramedullary nail with trans trochanteric and distal locking screw fixation traverse intertrochanteric femur fracture. Fluoroscopy time 33 seconds. Dose 3.72 mGy. IMPRESSION: Intraoperative fluoroscopy during ORIF of left intertrochanteric femur fracture. Electronically Signed   By: Narda Rutherford M.D.   On: 05/13/2022 12:22   DG C-Arm 1-60 Min-No Report  Result Date: 05/13/2022 Fluoroscopy was utilized by the requesting physician.  No radiographic interpretation.   CT Hip Left Wo Contrast  Result Date: 05/12/2022 CLINICAL DATA:  Hip trauma, left femoral neck fracture.  Fall. EXAM: CT OF THE LEFT HIP WITHOUT CONTRAST TECHNIQUE: Multidetector CT imaging of the left hip was performed according to the standard protocol. Multiplanar CT image reconstructions were also generated. RADIATION DOSE REDUCTION: This exam was performed according to the departmental dose-optimization program which includes automated exposure control, adjustment of the mA and/or kV according to patient size and/or use of iterative reconstruction technique. COMPARISON:  Radiograph performed earlier on the same date. FINDINGS: Bones/Joint/Cartilage There is mildly comminuted intertrochanteric fracture of the left femur with superolateral displacement of the distal femur. The femoral head is located within the acetabulum. There is no femoral head dislocation. No appreciable pelvic fracture. Appreciable joint effusion. Ligaments Suboptimally assessed by CT. Muscles and Tendons Small hematoma about the fracture site. Generalized muscle atrophy. No evidence of tendon tear. Soft tissues Mild subcutaneous soft tissue edema about the lateral aspect of the hip. No fluid collection or  hematoma. IMPRESSION: Mildly comminuted intertrochanteric fracture of the left femur with superolateral displacement of the distal femur. No femoral head dislocation. Electronically Signed   By: Larose Hires D.O.   On: 05/12/2022 23:48   DG Hip Unilat With Pelvis 2-3 Views Left  Result Date: 05/12/2022 CLINICAL DATA:  Fall EXAM: DG HIP (WITH OR WITHOUT PELVIS) 3V LEFT COMPARISON:  None  Available. FINDINGS: Intertrochanteric fracture left femoral neck with varus angulation. Evaluation of the pelvis is very limited due to overlying bowel gas and stool. IMPRESSION: Left femoral neck fracture. Electronically Signed   By: Layla Maw M.D.   On: 05/12/2022 22:22   DG Chest 2 View  Result Date: 05/12/2022 CLINICAL DATA:  Fractured hip EXAM: CHEST - 2 VIEW COMPARISON:  03/01/2022 FINDINGS: Evaluation of the right hemithorax is nondiagnostic due to over penetration. There is evidence of alveolar consolidation left mid to lower lung. There may be left-sided pleural effusion. No gross pneumothorax no definite pneumothorax identified. Cardiac silhouette appears within normal limits. Aorta is tortuous. IMPRESSION: 1. There is suggestion of left basilar consolidation, likely pneumonia. 2. Repeat examination should be considered due to technical limitations. Electronically Signed   By: Layla Maw M.D.   On: 05/12/2022 22:20   CARDIAC EVENT MONITOR  Result Date: 05/09/2022 Normal sinus rhythm.  A run of PSVT at 174 bpm was noted on 11/25 at 7:07 PM (auto-detected) - this was 16 beats.  No symptoms associated with this were reported. Chrystie Nose, MD, [facility], FACP [place]  [facility] Medical Director of the [facility]id [facility]ovascular [facility] Diplomate of the American Board of Clinical Lipidology Attending Cardiologist Direct Dial: [phone]  Fax: [phone] Website:  [email or web address]   Assessment/Plan Acute hypoxemic respiratory failure (HCC) O2  desaturation, SOB, denied chest pain, she is afebrile, declined ED eval. MOST: comfort measures, IVF/ABT for a trial period.  drug induced PNA 2/2 chemo, on steroids, chemo on hold.   Will O2 2lpm via [place], CXR ap/lateral, CBC/diff, CMP/eGFR, Morphine prn, Lorazepam prn, hospice referral.   Closed left hip fracture, initial encounter (HCC) 2/31/23-05/20/22 Left hip fx, s/p ORIF, on Oxycodone for pain  Osteoarthritis 2/31/23-05/20/22 Left hip fx, s/p ORIF, on Oxycodone for pain  Dysphagia nectar liquid  Stroke (HCC)  02/26/22 hospitalized for a small acute infarct in the left cerebellum on MRI, completed 3 weeks of DAPT, on ASA only  Orthostatic hypotension  was on Midodrine.   Lung cancer (HCC) Hx of a large left pleural effusion, s/p thoracentesis x2, drained 1.5L, Cytology showed adenocarcinoma stage IV lung cancer, CT showed left upper lobe mass invades the mediastinum at the level of the aortic or pulmonayr window, concerning for lymphangitic spread of disease,  f/u pulmonology Dr. Tonia Brooms,  oncology Dr. Arbutus Ped, taking palliative chemotherapy/immunotherapy, on Tagrisso prior. Decadron before and after chemo.  Anemia, chronic disease Hgb 10.1 05/17/22,  transfused.   Tachycardia  heart rate is in control, on Metoprolol  Hyponatremia Na 138 1/524  Edema Trace edema BLE, off Spironolactone.         Family/ staff Communication: plan of care reviewed with the patient and charge nurse.   Labs/tests ordered: CBC/diff, CMP/eGFR, CXR ap/lateral.  Time spend 35 minutes

## 2022-05-23 NOTE — Assessment & Plan Note (Signed)
heart rate is in control, on Metoprolol

## 2022-05-23 NOTE — Assessment & Plan Note (Signed)
Na 138 1/524

## 2022-05-23 NOTE — Assessment & Plan Note (Signed)
Trace edema BLE, off Spironolactone.

## 2022-05-23 NOTE — Assessment & Plan Note (Signed)
2/31/23-05/20/22 Left hip fx, s/p ORIF, on Oxycodone for pain

## 2022-05-23 NOTE — Assessment & Plan Note (Signed)
Hx of a large left pleural effusion, s/p thoracentesis x2, drained 1.5L, Cytology showed adenocarcinoma stage IV lung cancer, CT showed left upper lobe mass invades the mediastinum at the level of the aortic or pulmonayr window, concerning for lymphangitic spread of disease,  f/u pulmonology Dr. Valeta Harms,  oncology Dr. Julien Nordmann, taking palliative chemotherapy/immunotherapy, on Tagrisso prior. Decadron before and after chemo.

## 2022-05-23 NOTE — Assessment & Plan Note (Signed)
O2 desaturation, SOB, denied chest pain, she is afebrile, declined ED eval. MOST: comfort measures, IVF/ABT for a trial period.  drug induced PNA 2/2 chemo, on steroids, chemo on hold.   Will O2 2lpm via [place], CXR ap/lateral, CBC/diff, CMP/eGFR, Morphine prn, Lorazepam prn, hospice referral.

## 2022-05-23 NOTE — Assessment & Plan Note (Signed)
02/26/22 hospitalized for a small acute infarct in the left cerebellum on MRI, completed 3 weeks of DAPT, on ASA only

## 2022-05-23 NOTE — Assessment & Plan Note (Signed)
nectar liquid

## 2022-05-23 NOTE — Assessment & Plan Note (Signed)
Hgb 10.1 05/17/22,  transfused.

## 2022-05-23 NOTE — Assessment & Plan Note (Signed)
was on Midodrine.

## 2022-05-24 ENCOUNTER — Encounter: Payer: Self-pay | Admitting: Nurse Practitioner

## 2022-05-27 ENCOUNTER — Encounter: Payer: Self-pay | Admitting: Nurse Practitioner

## 2022-05-27 ENCOUNTER — Telehealth: Payer: Self-pay | Admitting: Medical Oncology

## 2022-05-27 ENCOUNTER — Non-Acute Institutional Stay (SKILLED_NURSING_FACILITY): Payer: Medicare PPO | Admitting: Nurse Practitioner

## 2022-05-27 DIAGNOSIS — R1310 Dysphagia, unspecified: Secondary | ICD-10-CM | POA: Diagnosis not present

## 2022-05-27 DIAGNOSIS — M1652 Unilateral post-traumatic osteoarthritis, left hip: Secondary | ICD-10-CM

## 2022-05-27 DIAGNOSIS — L89152 Pressure ulcer of sacral region, stage 2: Secondary | ICD-10-CM

## 2022-05-27 DIAGNOSIS — J9601 Acute respiratory failure with hypoxia: Secondary | ICD-10-CM

## 2022-05-27 DIAGNOSIS — R000 Tachycardia, unspecified: Secondary | ICD-10-CM

## 2022-05-27 DIAGNOSIS — C3492 Malignant neoplasm of unspecified part of left bronchus or lung: Secondary | ICD-10-CM

## 2022-05-28 ENCOUNTER — Other Ambulatory Visit (HOSPITAL_COMMUNITY): Payer: Self-pay

## 2022-05-28 ENCOUNTER — Encounter: Payer: Self-pay | Admitting: Nurse Practitioner

## 2022-05-29 ENCOUNTER — Other Ambulatory Visit: Payer: Medicare PPO

## 2022-05-29 MED FILL — Dexamethasone Sodium Phosphate Inj 100 MG/10ML: INTRAMUSCULAR | Qty: 1 | Status: AC

## 2022-05-29 MED FILL — Fosaprepitant Dimeglumine For IV Infusion 150 MG (Base Eq): INTRAVENOUS | Qty: 5 | Status: AC

## 2022-05-30 ENCOUNTER — Telehealth: Payer: Self-pay | Admitting: Medical Oncology

## 2022-05-30 ENCOUNTER — Ambulatory Visit: Payer: Medicare PPO | Admitting: Internal Medicine

## 2022-05-30 ENCOUNTER — Other Ambulatory Visit (HOSPITAL_COMMUNITY): Payer: Self-pay

## 2022-05-30 ENCOUNTER — Inpatient Hospital Stay: Payer: Medicare PPO

## 2022-05-30 ENCOUNTER — Other Ambulatory Visit: Payer: Medicare PPO

## 2022-05-30 ENCOUNTER — Ambulatory Visit: Payer: Medicare PPO

## 2022-05-30 ENCOUNTER — Inpatient Hospital Stay: Payer: Medicare PPO | Admitting: Internal Medicine

## 2022-05-30 NOTE — Telephone Encounter (Signed)
Shelley Flores  died 06-11-2022

## 2022-06-05 ENCOUNTER — Ambulatory Visit: Payer: Medicare PPO

## 2022-06-05 ENCOUNTER — Other Ambulatory Visit: Payer: Medicare PPO

## 2022-06-05 ENCOUNTER — Ambulatory Visit: Payer: Medicare PPO | Admitting: Internal Medicine

## 2022-06-12 ENCOUNTER — Other Ambulatory Visit: Payer: Medicare PPO

## 2022-06-13 NOTE — Telephone Encounter (Signed)
Pt caregiver @ [facility] said pt has not been out of bed.  She does not know if pt can make appt on Thursday with Dr. Arbutus Ped. She said Orpha Bur, the Child psychotherapist has seen Markiya  and pt may be referred to Hospice.   I left VM for Orpha Bur -Child psychotherapist to return my call.

## 2022-06-13 NOTE — Assessment & Plan Note (Signed)
heart rate is in control, on Metoprolol

## 2022-06-13 NOTE — Assessment & Plan Note (Signed)
Hx of a large left pleural effusion, s/p thoracentesis x2, drained 1.5L, Cytology showed adenocarcinoma stage IV lung cancer, CT showed left upper lobe mass invades the mediastinum at the level of the aortic or pulmonayr window, concerning for lymphangitic spread of disease,  f/u pulmonology Dr. Tonia Brooms,  oncology Dr. Arbutus Ped, taking palliative chemotherapy/immunotherapy, on Tagrisso prior. Decadron before and after chemo.

## 2022-06-13 NOTE — Progress Notes (Unsigned)
Location:   SNF [facility] Room Number: 42-B Place of Service:  SNF (31) Provider: [facility] Claudis Giovanelli NP  Mahlon Gammon, MD  Patient Care Team: Mahlon Gammon, MD as PCP - General (Internal Medicine) Rennis Golden Lisette Abu, MD as PCP - Cardiology (Cardiology) Syliva Overman, RN as Oncology Nurse Navigator (Oncology)  Extended Emergency Contact Information Primary Emergency Contact: Smithwick,Pat Home Phone: [phone] Mobile Phone: [phone] Relation: Friend Secondary Emergency Contact: Kerry Dory TEFL teacher) Home Phone: [phone] Relation: Other  Code Status: DNR Goals of care: Advanced Directive information    05/13/2022   10:36 AM  Advanced Directives  Type of Advance Directive Healthcare Power of [place];Living will     Chief Complaint  Patient presents with   Acute Visit    Pressure ulcer coccyx    HPI:  Pt is a 89 y.o. female seen today for an acute visit for coccyx pressure ulcer stage 2, no s/s of infection   Hypoxemia, O2 desaturation, SOB, denied chest pain, she is afebrile, declined ED eval. MOST: comfort measures, No IVF, ABT for a trial period, Morphine, Lorazepam available to her.              05/12/22-05/20/22 Left hip fx, s/p ORIF, on Oxycodone for pain,  volume depletion, drug induced PNA 2/2 chemo, on steroids, chemo on hold.              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               Hx of a large left pleural effusion, s/p thoracentesis x2, drained 1.5L, Cytology showed adenocarcinoma stage IV lung cancer, CT showed left upper lobe mass invades the mediastinum at the level of the aortic or pulmonayr window, concerning for lymphangitic spread of disease,  f/u pulmonology Dr. Tonia Brooms,  oncology Dr. Arbutus Ped, taking palliative chemotherapy/immunotherapy, on Tagrisso prior.  Decadron before and after chemo.             Anemia, Hgb 10.1 05/17/22,  transfused.              Tachycardia, heart rate is in control, on Metoprolol             Hyperlipidemia, off Atorvastatin, LDL 67 02/27/22             SOB/mild valvular heart disease, Echo EF 65-70%             Hypophosphatemia, 2.2 02/27/22             Hyponatremia, Na 138 1/524             Edema BLE, off Spironolactone.            Past Medical History:  Diagnosis Date   Bruit    Abdominal bruit - Abdominal aorta/renal duplex Doppler evaluation 12/07/03 -Mildly abnormal evaluation. 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 This is consistant w/median arcuate ligament compression syndrome. *Right & Left Kidney: Essentially equal in size, symmetrical in shape w/no significant abnormalities visualized. *Right & Left Renal Arteries: No significant abnormalities.   Cancer (HCC)    Lung CA stg IV   Edema extremities    LE edema    H/O myocardial perfusion scan 02/20/2000  To rule out ischemia - Negative adequate Bruce protocol exercise stress test with a deconditioned exercise response and normal static myocardial perfusion images with EF calculated by QGS of 77%. Represents a low risk study.   Hyperlipidemia    Hypertension    Osteoarthritis    Osteoporosis    Pleural effusion 04/2020   Valvular heart disease    Mild valvular heart disease by Echo in 2010, all mild and symptomatic, including concentric LVH, MR, TR, and AI with pulmonary artery pressure of 32. EF was normal.   Past Surgical History:  Procedure Laterality Date   Abdominal aorta/Renal duplex Doppler Evaluation  12/07/03   For abdominal bruit. Mildly abnormal evaluation. (See bruit in medical history)   BREAST EXCISIONAL BIOPSY Left 1966   BREAST EXCISIONAL BIOPSY Left 1999   CATARACT EXTRACTION  2003   CHEST TUBE INSERTION N/A 07/04/2020   Procedure: REMOVAL PLEURAL DRAINAGE CATHETER;  Surgeon: Josephine Igo, DO;  Location: [facility];   Service: Pulmonary;  Laterality: N/A;   COLONOSCOPY  10/22/2004   DEXA Bone Scan  06/23/2013   INTRAMEDULLARY (IM) NAIL INTERTROCHANTERIC Left 05/13/2022   Procedure: INTRAMEDULLARY (IM) NAIL INTERTROCHANTERIC;  Surgeon: Terance Hart, MD;  Location: [facility];  Service: Orthopedics;  Laterality: Left;   IR PERC PLEURAL DRAIN W/INDWELL CATH W/IMG GUIDE  05/10/2020   IR THORACENTESIS ASP PLEURAL SPACE W/IMG GUIDE  05/04/2020    Allergies  Allergen Reactions   Irbesartan Nausea Only    Allergies as of 05/20/2022       Reactions   Irbesartan Nausea Only        Medication List        Accurate as of May 27, 2022 11:59 PM. If you have any questions, ask your nurse or doctor.          acetaminophen 325 MG tablet Commonly known as: TYLENOL Take 2 tablets (650 mg total) by mouth every 6 (six) hours as needed for mild pain (or Fever >/= 101).   aspirin EC 325 MG tablet Take 1 tablet (325 mg total) by mouth daily.   atorvastatin 20 MG tablet Commonly known as: LIPITOR Take 20 mg by mouth at bedtime.   folic acid 1 MG tablet Commonly known as: FOLVITE Take 1 tablet (1 mg total) by mouth daily.   iVIZIA Dry Eyes 0.5 % Soln Generic drug: Povidone (PF) Place 1 drop into both eyes daily.   Aspercreme Lidocaine 4 % Generic drug: lidocaine Place 1 patch onto the skin in the morning and at bedtime.   lidocaine 5 % Commonly known as: LIDODERM Place 1 patch onto the skin daily. Remove & Discard patch within 12 hours or as directed by MD   LORazepam 0.5 MG tablet Commonly known as: ATIVAN Take 0.5 mg by mouth every 4 (four) hours as needed for anxiety.   magnesium hydroxide 400 MG/5ML suspension Commonly known as: MILK OF MAGNESIA Take 15 mLs by mouth daily as needed for mild constipation.   methocarbamol 500 MG tablet Commonly known as: ROBAXIN Take 1 tablet (500 mg total) by mouth every 6 (six) hours as needed for muscle spasms.   metoCLOPramide 5 MG  tablet Commonly known as: REGLAN Take 5 mg by mouth 2 (two) times daily. Take it Before meals   metoprolol tartrate 25 MG tablet Commonly known as: LOPRESSOR Take 12.5 mg by mouth 2 (two) times daily.   morphine 5 MG/ML injection Inject 0.25 mLs into the muscle every 4 (four) hours as needed.  omeprazole 20 MG capsule Commonly known as: PRILOSEC Take 1 capsule (20 mg total) by mouth daily.   ondansetron 4 MG tablet Commonly known as: ZOFRAN Take 4 mg by mouth every 8 (eight) hours as needed for nausea or vomiting.   oxyCODONE 5 MG immediate release tablet Commonly known as: Oxy IR/ROXICODONE Take 0.5 tablets (2.5 mg total) by mouth every 4 (four) hours as needed for moderate pain.   polyethylene glycol 17 g packet Commonly known as: MIRALAX / GLYCOLAX Take 17 g by mouth daily as needed for mild constipation.   predniSONE 10 MG tablet Commonly known as: DELTASONE Take 3 tablets (30 mg total) by mouth daily with breakfast for 4 days, THEN 2 tablets (20 mg total) daily with breakfast for 4 days, THEN 1 tablet (10 mg total) daily with breakfast for 10 days. 5 tablet p.o. daily for 1 week followed by 4 tablets p.o. daily for 1 week followed by 3 tablets p.o. daily for 1 week followed by 2 tablets p.o. daily for 1 week followed by 1 tablet p.o. daily for 1 week.. Start taking on: May 17, 2022   prochlorperazine 10 MG tablet Commonly known as: COMPAZINE Take 1 tablet (10 mg total) by mouth every 6 (six) hours as needed.   sorbitol 70 % Soln Take 30 mLs by mouth daily.        Review of Systems  Constitutional:  Positive for appetite change and fatigue. Negative for fever.       Persisted poor appetite and fatigue.   HENT:  Positive for hearing loss. Negative for congestion and trouble swallowing.   Eyes:  Negative for visual disturbance.  Respiratory:  Positive for cough and shortness of breath. Negative for wheezing.   Cardiovascular:  Positive for leg swelling. Negative  for chest pain and palpitations.  Gastrointestinal:  Negative for abdominal pain and constipation.       Poor appetite, early satiety.   Genitourinary:  Negative for dysuria, hematuria and urgency.  Musculoskeletal:  Positive for gait problem.  Skin:  Positive for wound. Negative for color change.  Neurological:  Negative for speech difficulty, weakness, light-headedness and headaches.       Groggy feeling worsens when she is up and moving about  Psychiatric/Behavioral:  Negative for behavioral problems and sleep disturbance. The patient is not nervous/anxious.     Immunization History  Administered Date(s) Administered   Fluad Quad(high Dose 65+) 01/06/2019, 02/08/2020, 02/27/2022   Influenza Split 08/14/2010, 01/16/2012, 02/02/2013, 01/24/2015, 02/01/2016, 02/19/2017, 01/28/2018   Influenza, High Dose Seasonal PF 01/24/2015, 02/01/2016, 01/28/2018, 02/15/2021   Influenza-Unspecified 02/01/2016   MODERNA COVID-19 SARS-COV-2 PEDS BIVALENT BOOSTER 6Y-11Y 10/24/2021   Moderna Covid-19 Vaccine Bivalent Booster 4yrs & up 03/14/2022   Moderna SARS-COV2 Booster Vaccination 10/18/2020   Moderna Sars-Covid-2 Vaccination 06/14/2019, 03/27/2020, 03/27/2020   PFIZER(Purple Top)SARS-COV-2 Vaccination 01/31/2021   Pneumococcal Conjugate-13 08/11/2013, 09/03/2013   Pneumococcal Polysaccharide-23 05/28/2004   Td 07/04/2000   Tdap 07/04/2009, 10/18/2019   Unspecified SARS-COV-2 Vaccination 03/27/2020   Zoster, Live 07/02/2005   Pertinent  Health Maintenance Due  Topic Date Due   INFLUENZA VACCINE  Completed   DEXA SCAN  Completed      05/18/2022    9:00 PM 05/19/2022    7:01 AM 05/20/2022    1:00 AM 05/20/2022   10:00 AM 05/20/2022    7:05 PM  Fall Risk  (RETIRED) Patient Fall Risk Level High fall risk High fall risk High fall risk High fall risk High fall risk  Functional Status Survey:    Vitals:   05/23/2022 1456  BP: (!) 159/77  Pulse: (!) 113  Resp: 19  Temp: 98.2 F (36.8 C)  SpO2:  96%  Weight: 101 lb 8 oz (46 kg)  Height: 5\' 4"  (1.626 m)   Body mass index is 17.42 kg/m. Physical Exam Vitals and nursing note reviewed.  Constitutional:      Comments: exhausted  HENT:     Head: Normocephalic and atraumatic.     Mouth/Throat:     Mouth: Mucous membranes are moist.     Pharynx: Oropharynx is clear.  Eyes:     Extraocular Movements: Extraocular movements intact.     Conjunctiva/sclera: Conjunctivae normal.     Pupils: Pupils are equal, round, and reactive to light.  Cardiovascular:     Rate and Rhythm: Normal rate and regular rhythm.     Heart sounds: Murmur heard.     Comments: Murmur is much more prominent upon standing and increased heart rate Pulmonary:     Breath sounds: Rales present.     Comments: Decreased breath sounds on left consistent with effusion noted on chest x-ray, tachypnea, accessory muscle involving with breathing.  Abdominal:     General: Abdomen is flat. Bowel sounds are normal.     Palpations: Abdomen is soft.     Tenderness: There is no abdominal tenderness.  Musculoskeletal:     Cervical back: Normal range of motion and neck supple.     Right lower leg: Edema present.     Left lower leg: Edema present.     Comments: Trace edema BLE  Skin:    General: Skin is warm and dry.     Comments: Pressure ulcer stage 2 coccyx.   Neurological:     General: No focal deficit present.     Mental Status: She is alert and oriented to person, place, and time. Mental status is at baseline.     Gait: Gait abnormal.     Comments: Mild left face weakness.   Psychiatric:        Mood and Affect: Mood normal.        Behavior: Behavior normal.        Thought Content: Thought content normal.        Judgment: Judgment normal.     Labs reviewed: Recent Labs    02/27/22 0430 03/13/22 1102 05/15/22 0333 05/16/22 0324 05/17/22 0335  NA 133*   < > 135 135 138  K 4.8   < > 4.1 3.9 3.9  CL 108   < > 106 107 107  CO2 20*   < > 22 22 23   GLUCOSE 94    < > 114* 96 113*  BUN 25*   < > 31* 33* 26*  CREATININE 0.81   < > 0.71 0.64 0.59  CALCIUM 9.3   < > 8.6* 9.0 8.8*  MG 2.0  --   --   --   --   PHOS 2.2*  --   --   --   --    < > = values in this interval not displayed.   Recent Labs    03/13/22 1102 03/21/22 0000 03/28/22 0000 04/03/22 1027 04/24/22 1018  AST 20   < > 23 21 16   ALT 29   < > 24 16 9   ALKPHOS 88   < > 80 75 67  BILITOT 0.3  --   --  0.4 0.4  PROT 6.3*  --   --  6.3* 5.6*  ALBUMIN 3.7   < > 3.7 3.4* 3.1*   < > = values in this interval not displayed.   Recent Labs    04/24/22 1018 05/12/22 2253 05/12/22 2302 05/15/22 0333 05/16/22 0324 05/17/22 0335  WBC 7.0 29.0*   < > 14.3* 14.6* 9.5  NEUTROABS 5.0 27.5*  --   --   --  9.1*  HGB 9.3* 10.4*   < > 7.1* 7.2* 10.1*  HCT 26.4* 31.4*   < > 22.2* 22.2* 29.9*  MCV 89.5 97.2   < > 101.8* 100.9* 94.0  PLT 348 154   < > 83* 104* 95*   < > = values in this interval not displayed.   Lab Results  Component Value Date   TSH 2.797 04/24/2022   Lab Results  Component Value Date   HGBA1C 5.7 (H) 02/26/2022   Lab Results  Component Value Date   CHOL 143 02/27/2022   HDL 59 02/27/2022   LDLCALC 67 02/27/2022   TRIG 83 02/27/2022   CHOLHDL 2.4 02/27/2022    Significant Diagnostic Results in last 30 days:  DG Pelvis 1-2 Views  Result Date: 05/15/2022 CLINICAL DATA:  Low back pain. EXAM: PELVIS - 1-2 VIEW COMPARISON:  Intraoperative fluoroscopic images 1124. Pelvic/left hip radiographs 05/12/2022. FINDINGS: ORIF of a left femoral neck fracture is again noted with placement of an intramedullary nail and screw terminating in the femoral head. Alignment appears near anatomic on this single projection. No new fracture is identified. The bones are diffusely osteopenic. IMPRESSION: ORIF of left femoral neck fracture. Electronically Signed   By: Sebastian Ache M.D.   On: 05/15/2022 16:35   DG Lumbar Spine 2-3 Views  Result Date: 05/15/2022 CLINICAL DATA:  Low back  pain EXAM: LUMBAR SPINE - 2-3 VIEW COMPARISON:  CT 05/09/2022. FINDINGS: There is a mild curvature of the thoracic spine which appears convex towards the left. The vertebral body heights appear well preserved. No signs of acute fracture. Stepwise retrolisthesis of L1 on L2, L2 on L3 and L3 on L4 noted. Multilevel disc space narrowing and endplate spurring is noted. This is most advanced at L2-3. Facet arthropathy noted at L4-5 and L5-S1. Large stool burden noted within the visualized portions of the colon IMPRESSION: 1. No acute findings. 2. Multilevel degenerative disc disease and facet arthropathy. 3. Large stool burden within the visualized portions of the colon. Correlate for any clinical signs or symptoms of constipation. Electronically Signed   By: Signa Kell M.D.   On: 05/15/2022 16:29   DG HIP UNILAT WITH PELVIS 2-3 VIEWS LEFT  Result Date: 05/13/2022 CLINICAL DATA:  Femur fracture.  Left hip IM nail. EXAM: DG HIP (WITH OR WITHOUT PELVIS) 2-3V LEFT COMPARISON:  Preoperative imaging FINDINGS: Four fluoroscopic spot views of the left hip obtained in the operating room. Intramedullary nail with trans trochanteric and distal locking screw fixation traverse intertrochanteric femur fracture. Fluoroscopy time 33 seconds. Dose 3.72 mGy. IMPRESSION: Intraoperative fluoroscopy during ORIF of left intertrochanteric femur fracture. Electronically Signed   By: Narda Rutherford M.D.   On: 05/13/2022 12:22   DG C-Arm 1-60 Min-No Report  Result Date: 05/13/2022 Fluoroscopy was utilized by the requesting physician.  No radiographic interpretation.   CT Hip Left Wo Contrast  Result Date: 05/12/2022 CLINICAL DATA:  Hip trauma, left femoral neck fracture.  Fall. EXAM: CT OF THE LEFT HIP WITHOUT CONTRAST TECHNIQUE: Multidetector CT imaging of the left hip was performed according to the standard protocol. Multiplanar CT  image reconstructions were also generated. RADIATION DOSE REDUCTION: This exam was performed  according to the departmental dose-optimization program which includes automated exposure control, adjustment of the mA and/or kV according to patient size and/or use of iterative reconstruction technique. COMPARISON:  Radiograph performed earlier on the same date. FINDINGS: Bones/Joint/Cartilage There is mildly comminuted intertrochanteric fracture of the left femur with superolateral displacement of the distal femur. The femoral head is located within the acetabulum. There is no femoral head dislocation. No appreciable pelvic fracture. Appreciable joint effusion. Ligaments Suboptimally assessed by CT. Muscles and Tendons Small hematoma about the fracture site. Generalized muscle atrophy. No evidence of tendon tear. Soft tissues Mild subcutaneous soft tissue edema about the lateral aspect of the hip. No fluid collection or hematoma. IMPRESSION: Mildly comminuted intertrochanteric fracture of the left femur with superolateral displacement of the distal femur. No femoral head dislocation. Electronically Signed   By: Larose Hires D.O.   On: 05/12/2022 23:48   DG Hip Unilat With Pelvis 2-3 Views Left  Result Date: 05/12/2022 CLINICAL DATA:  Fall EXAM: DG HIP (WITH OR WITHOUT PELVIS) 3V LEFT COMPARISON:  None Available. FINDINGS: Intertrochanteric fracture left femoral neck with varus angulation. Evaluation of the pelvis is very limited due to overlying bowel gas and stool. IMPRESSION: Left femoral neck fracture. Electronically Signed   By: Layla Maw M.D.   On: 05/12/2022 22:22   DG Chest 2 View  Result Date: 05/12/2022 CLINICAL DATA:  Fractured hip EXAM: CHEST - 2 VIEW COMPARISON:  03/01/2022 FINDINGS: Evaluation of the right hemithorax is nondiagnostic due to over penetration. There is evidence of alveolar consolidation left mid to lower lung. There may be left-sided pleural effusion. No gross pneumothorax no definite pneumothorax identified. Cardiac silhouette appears within normal limits. Aorta is  tortuous. IMPRESSION: 1. There is suggestion of left basilar consolidation, likely pneumonia. 2. Repeat examination should be considered due to technical limitations. Electronically Signed   By: Layla Maw M.D.   On: 05/12/2022 22:20   CARDIAC EVENT MONITOR  Result Date: 05/09/2022 Normal sinus rhythm.  A run of PSVT at 174 bpm was noted on 11/25 at 7:07 PM (auto-detected) - this was 16 beats.  No symptoms associated with this were reported. Chrystie Nose, MD, [place], FACP [place]  [facility] Medical Director of the [facility]ovascular [facility] Diplomate of the American Board of Clinical Lipidology Attending Cardiologist Direct Dial: [phone]  Fax: [phone] Website:  [email or web address]   Assessment/Plan: Pressure ulcer of coccygeal region, stage 2 (HCC) Air mattress overlay, assist with patient with frequent repositioning, apply Hydrocolloid dressing q3 days and prn.   Acute hypoxemic respiratory failure (HCC) Hypoxemia, O2 desaturation, SOB, denied chest pain, she is afebrile, declined ED eval. MOST: comfort measures, No IVF, ABT for a trial period, Morphine, Lorazepam available to her.   Osteoarthritis 05/12/22-05/20/22 Left hip fx, s/p ORIF, on Oxycodone for pain,  volume depletion, drug induced PNA 2/2 chemo, on steroids, chemo on hold.   Dysphagia nectar liquid  Adenocarcinoma of left lung, stage 4 (HCC) Hx of a large left pleural effusion, s/p thoracentesis x2, drained 1.5L, Cytology showed adenocarcinoma stage IV lung cancer, CT showed left upper lobe mass invades the mediastinum at the level of the aortic or pulmonayr window, concerning for lymphangitic spread of disease,  f/u pulmonology Dr. Tonia Brooms,  oncology Dr. Arbutus Ped, taking palliative chemotherapy/immunotherapy, on Tagrisso prior. Decadron before and after chemo.  Tachycardia heart rate is in control, on Metoprolol  Family/ staff Communication: plan of care reviewed  with the patient and charge nurse.   Labs/tests ordered:  none  Time spend 35 minutes.

## 2022-06-13 NOTE — Assessment & Plan Note (Signed)
Hypoxemia, O2 desaturation, SOB, denied chest pain, she is afebrile, declined ED eval. MOST: comfort measures, No IVF, ABT for a trial period, Morphine, Lorazepam available to her.

## 2022-06-13 NOTE — Telephone Encounter (Signed)
Pt back at [facility] in skilled unit @ [facility]. She broke  her Left hip and is recovering there.

## 2022-06-13 NOTE — Assessment & Plan Note (Signed)
Air mattress overlay, assist with patient with frequent repositioning, apply Hydrocolloid dressing q3 days and prn.

## 2022-06-13 NOTE — Assessment & Plan Note (Signed)
nectar liquid

## 2022-06-13 NOTE — Assessment & Plan Note (Signed)
05/12/22-05/20/22 Left hip fx, s/p ORIF, on Oxycodone for pain,  volume depletion, drug induced PNA 2/2 chemo, on steroids, chemo on hold.

## 2022-06-13 DEATH — deceased

## 2022-06-20 ENCOUNTER — Other Ambulatory Visit: Payer: Medicare PPO

## 2022-06-20 ENCOUNTER — Ambulatory Visit: Payer: Medicare PPO

## 2022-06-20 ENCOUNTER — Ambulatory Visit: Payer: Medicare PPO | Admitting: Internal Medicine

## 2022-06-20 ENCOUNTER — Encounter: Payer: Medicare PPO | Admitting: Nutrition

## 2022-07-11 ENCOUNTER — Other Ambulatory Visit: Payer: Medicare PPO

## 2022-07-11 ENCOUNTER — Ambulatory Visit: Payer: Medicare PPO | Admitting: Internal Medicine

## 2022-07-11 ENCOUNTER — Ambulatory Visit: Payer: Medicare PPO

## 2022-07-26 IMAGING — CR DG CHEST 2V
2 series · 2 of 2 positions shown · non-contrast
Comparison: April 21, 2020

CLINICAL DATA: Chronic cough and shortness of breath

EXAM:
CHEST - 2 VIEW

[w chest pa]
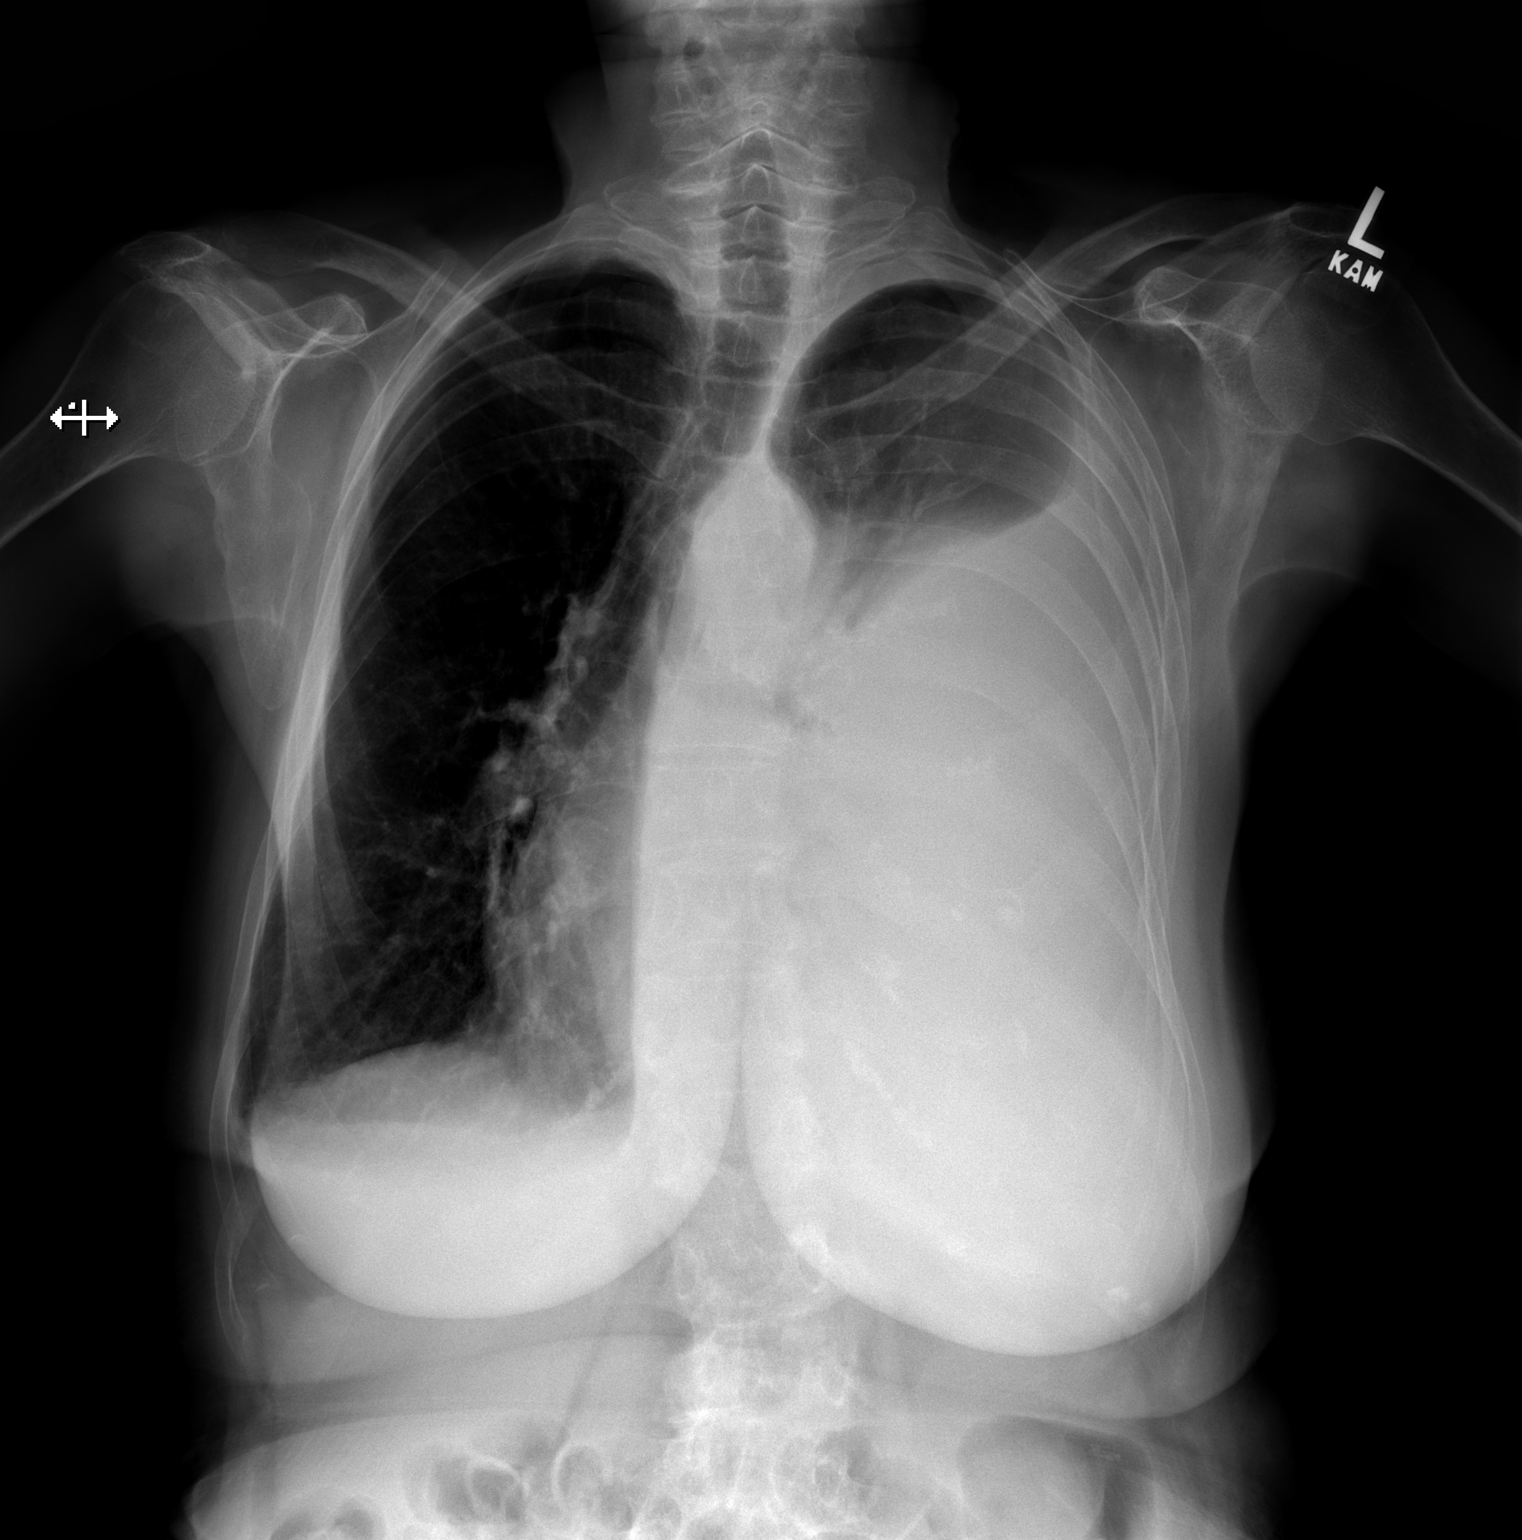

[w chest lat]
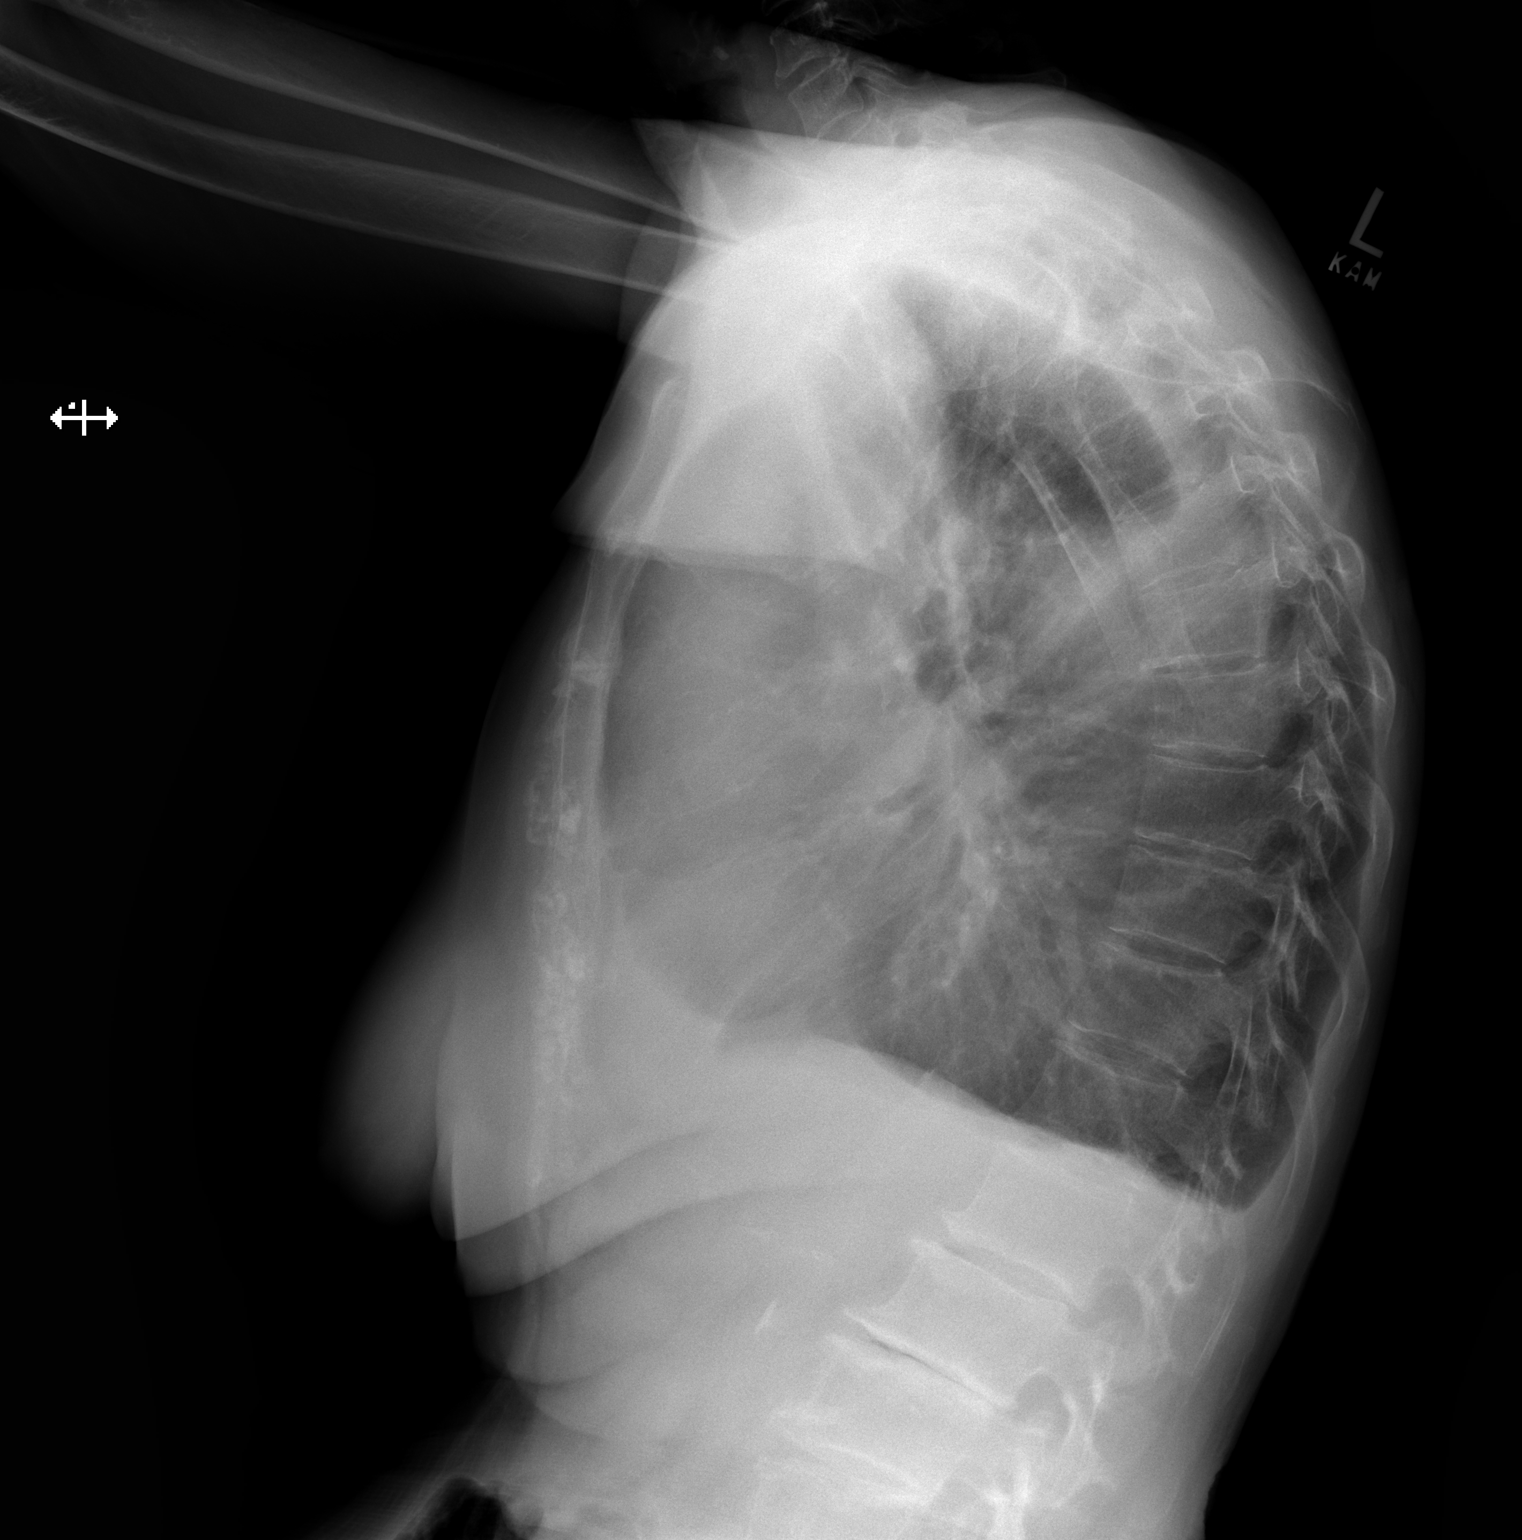

[2 of 2 positions shown; findings below may reference images not displayed]

FINDINGS: There is a large left pleural effusion. There may well be a degree
of underlying atelectasis and/or consolidation on the left. There is
a minimal right pleural effusion. Right lung otherwise clear. Heart
size is normal. Pulmonary vascular on the right is normal. There is
distortion of pulmonary vascular on the left due to the large
effusion. No adenopathy appreciable. No bone lesions.
IMPRESSION: Large left pleural effusion occupying much of the left hemithorax,
similar to recent prior study. Question underlying atelectasis
and/or consolidation on the left. Right lung clear except for
minimal right pleural effusion. Heart size normal.

These results will be called to the ordering clinician or
representative by the Radiologist Assistant, and communication
documented in the PACS or [REDACTED].

## 2022-07-31 IMAGING — DX DG CHEST 2V
2 series · 2 of 2 positions shown · non-contrast
Comparison: CT chest, 05/02/2020, chest radiographs, 05/01/2020

CLINICAL DATA: Shortness of breath, pleural effusion

EXAM:
CHEST - 2 VIEW

[chest pa]
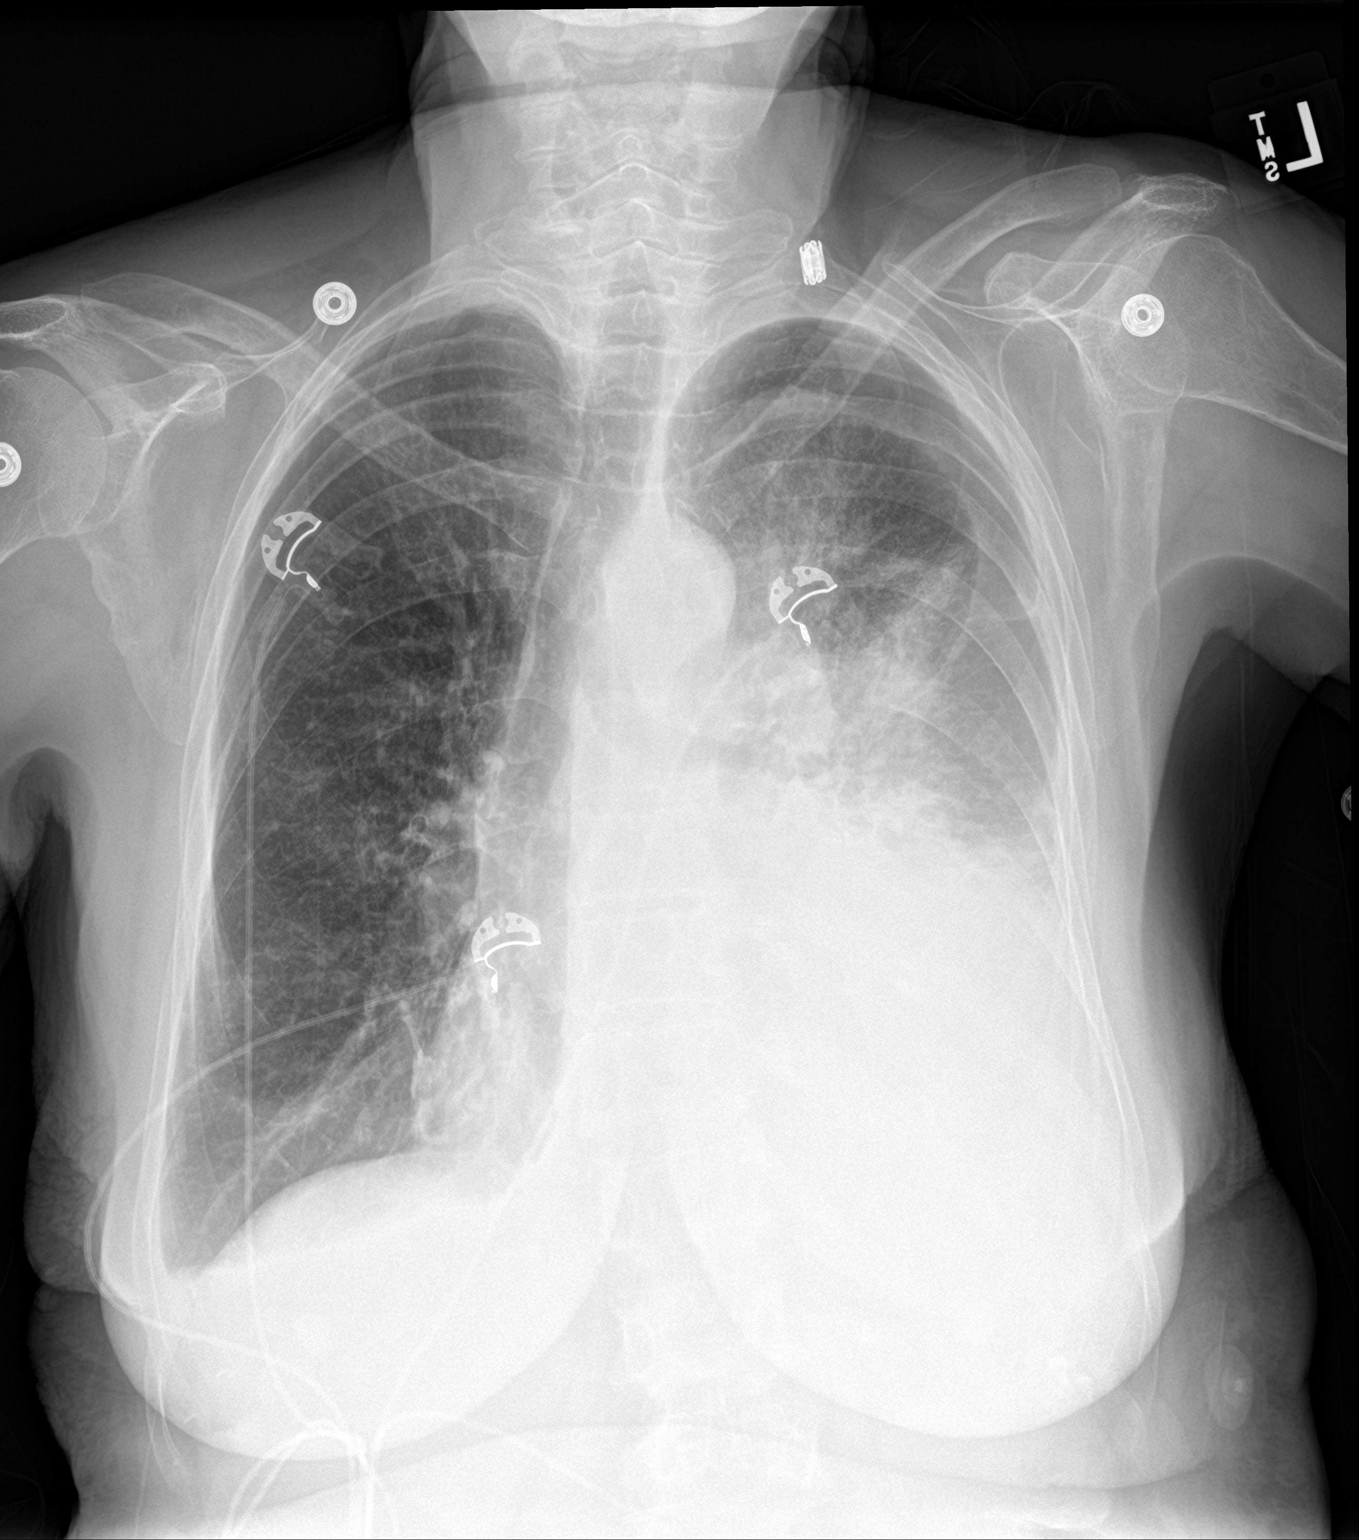

[chest lat]
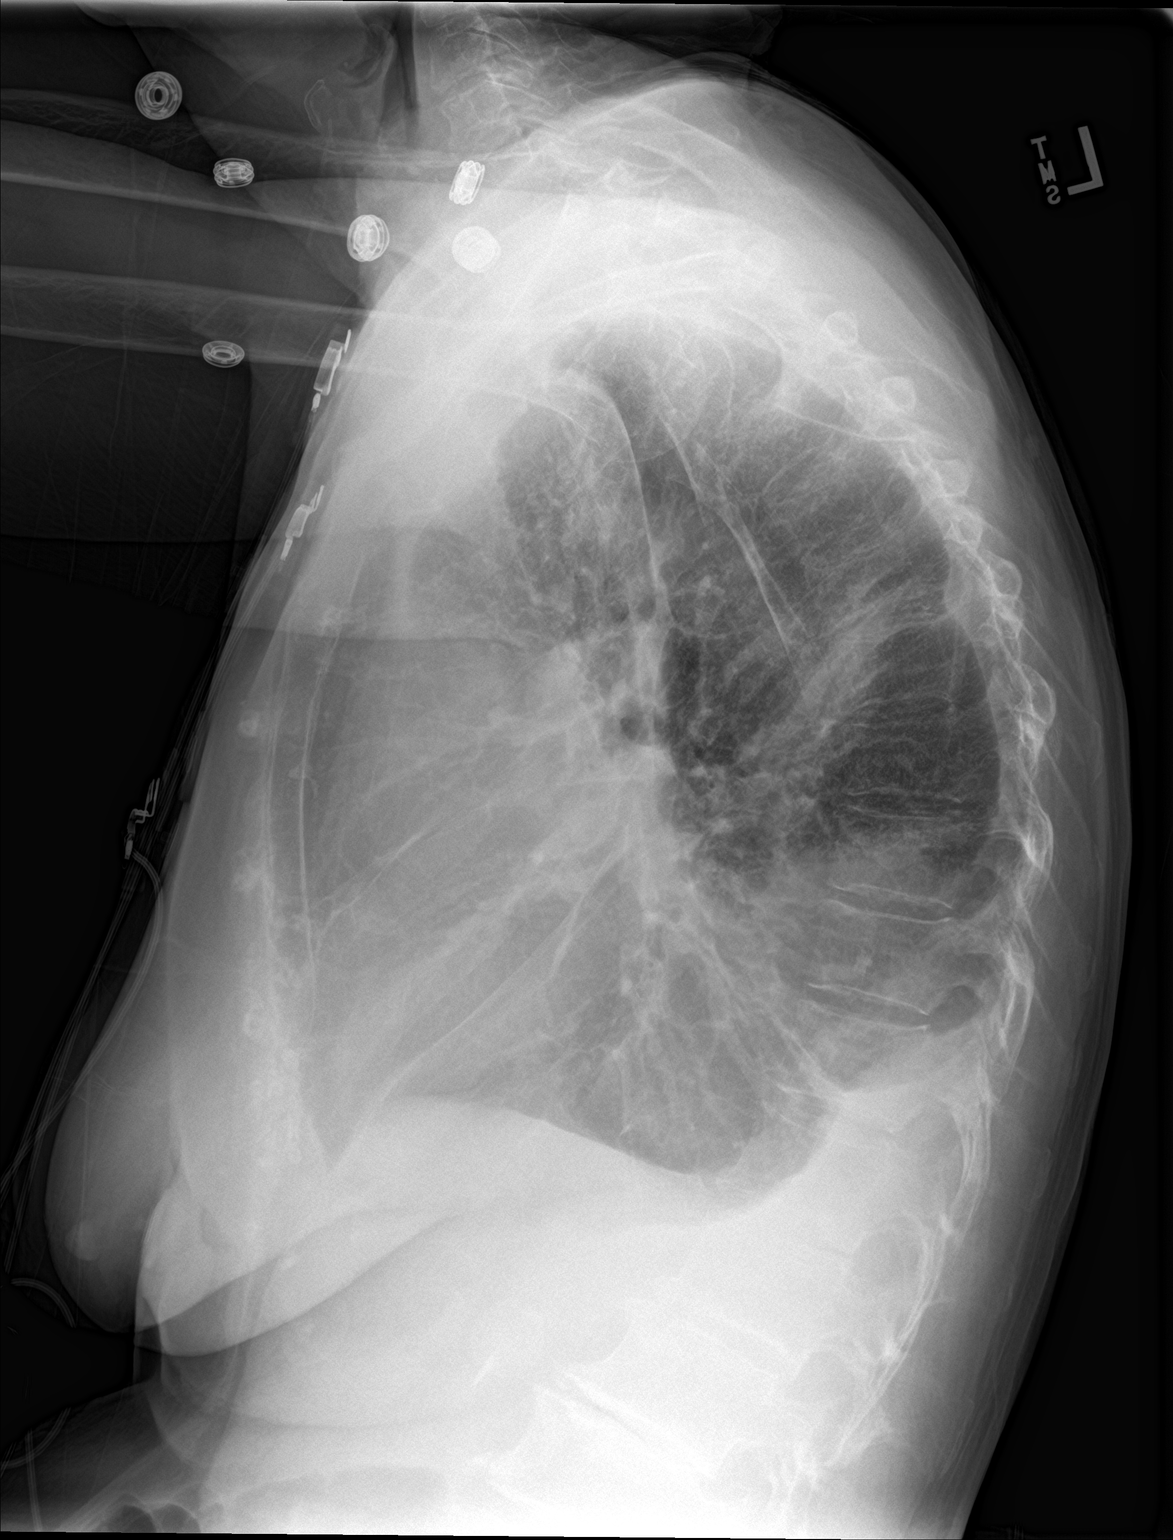

[2 of 2 positions shown; findings below may reference images not displayed]

FINDINGS: Large left, small right pleural effusions with associated
atelectasis or consolidation are not significantly changed compared
to prior examination. No new airspace opacity. The cardiac borders
are largely obscured. Osseous structures are unremarkable.
IMPRESSION: Large left, small right pleural effusions with associated
atelectasis or consolidation are not significantly changed compared
to prior examination. No new airspace opacity.

## 2022-08-03 IMAGING — DX DG CHEST 1V
1 series · 1 of 1 positions shown · non-contrast
Comparison: 05/05/2020

CLINICAL DATA: Shortness of breath.

EXAM:
CHEST  1 VIEW

[chest ap]
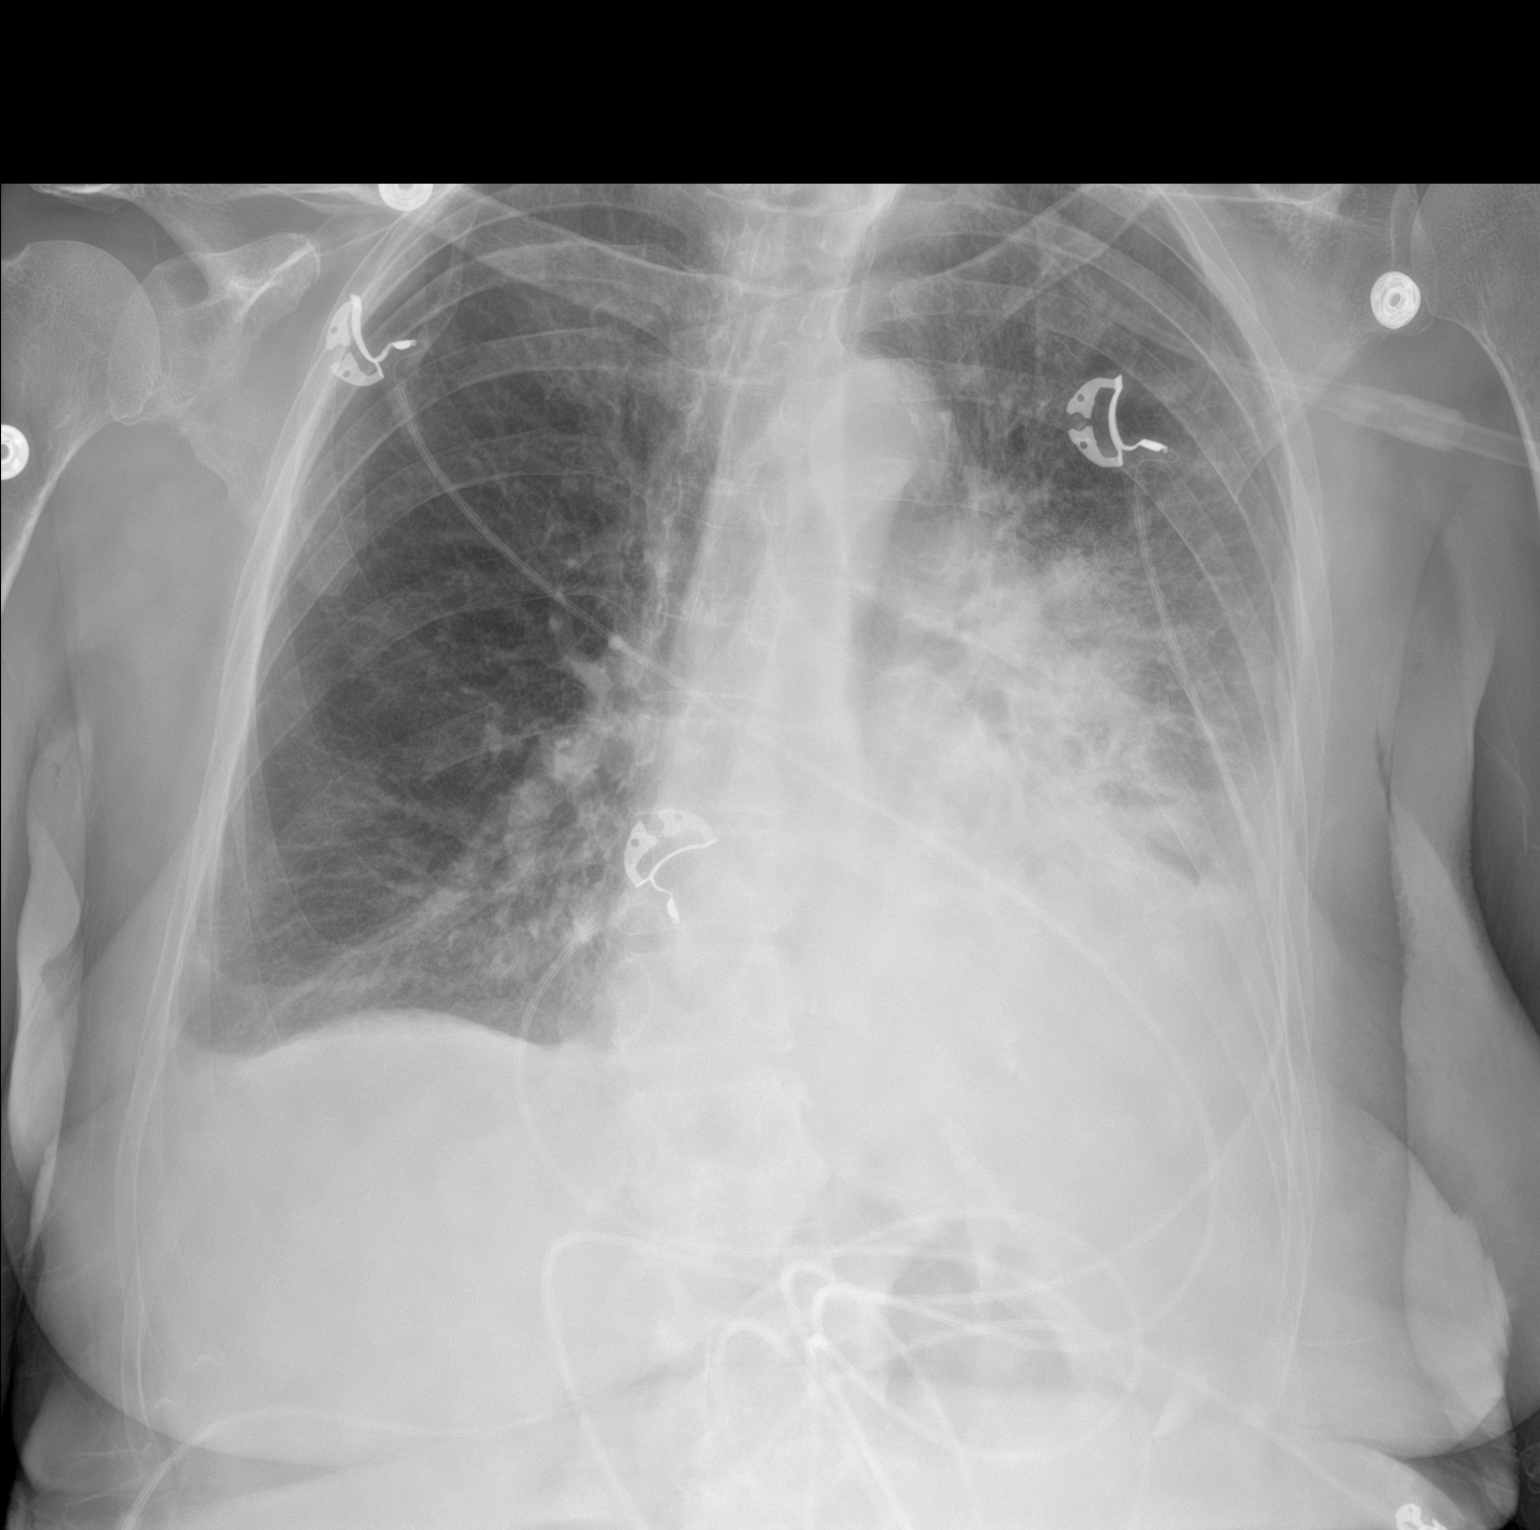

[1 of 1 positions shown; findings below may reference images not displayed]

FINDINGS: 7424 hours. Interval progression of left parahilar and retrocardiac
basilar collapse/consolidative opacity. Left pleural effusion noted.
There is probable some minimal atelectasis at the right base with
tiny right pleural effusion.
IMPRESSION: 1. Interval progression of left parahilar and retrocardiac basilar
collapse/consolidative opacity.
2. Left pleural effusion.
3. Similar appearance of minimal atelectasis/infiltrate at the right
base with tiny right effusion.

## 2022-08-08 IMAGING — DX DG CHEST 1V PORT
1 series · 1 of 1 positions shown · non-contrast
Comparison: May 10, 2020

CLINICAL DATA: Shortness of breath with cough

EXAM:
PORTABLE CHEST 1 VIEW

[chest]
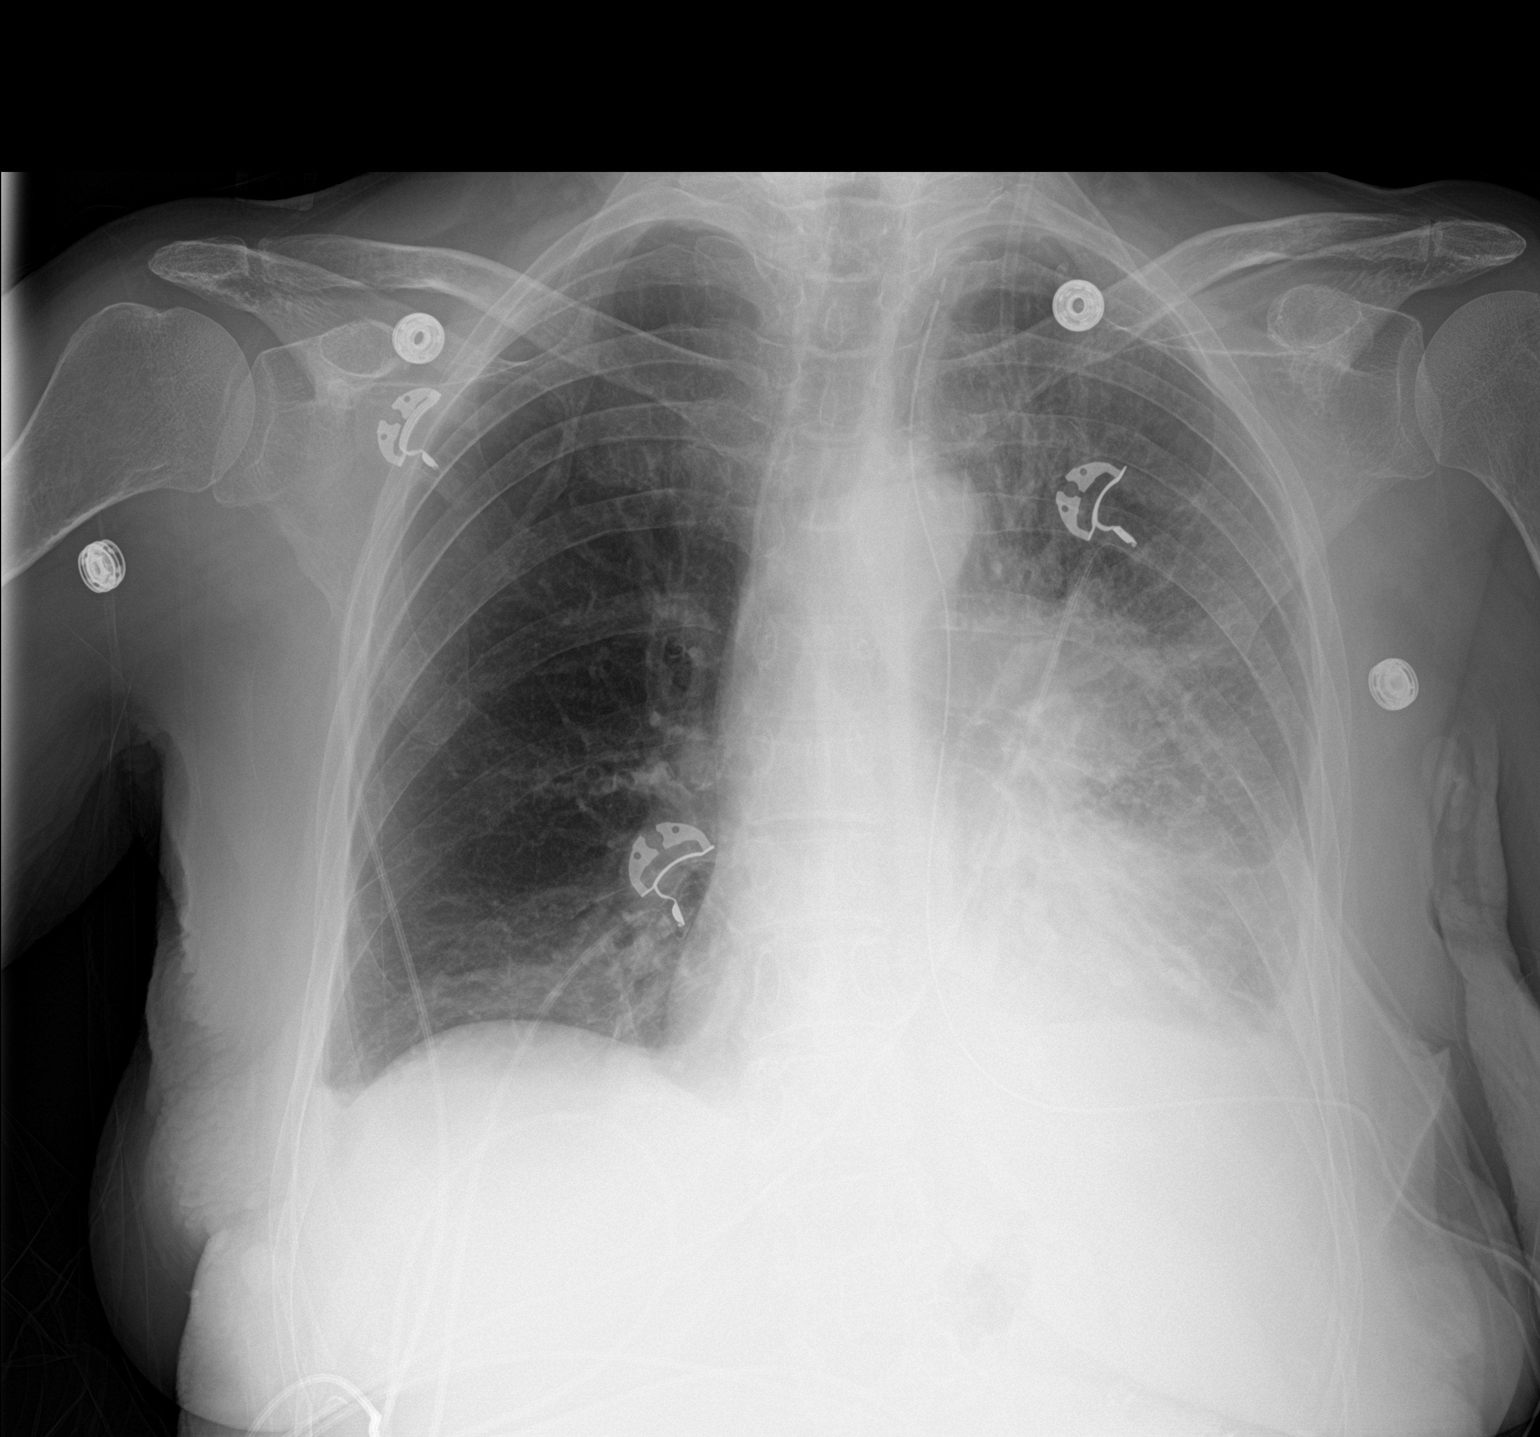

[1 of 1 positions shown; findings below may reference images not displayed]

FINDINGS: Left drainage catheter present medially with interval drainage of
most of the pleural effusion compared to 1 day prior. Small amount
of residual effusion remains with atelectatic change in the left mid
lower lung regions. Apparent consolidation abutting the left hilum
is present. On the right, there is a small pleural effusion with
slight right base atelectasis. Heart is upper normal in size with
pulmonary vascularity normal. No adenopathy no bone lesions.
IMPRESSION: Most of pleural fluid drain on the left following drainage catheter
placement. No pneumothorax. Consolidation overlying the left
hilum/perihilar region again noted. Stable cardiac silhouette.

Small right pleural effusion with mild right base atelectasis. Right
lung otherwise clear.

## 2022-11-07 ENCOUNTER — Ambulatory Visit: Payer: Medicare PPO | Admitting: Neurology
